# Patient Record
Sex: Male | Born: 1935
Health system: Southern US, Community
[De-identification: ages and names within clinical notes are randomized; demographics above are authoritative.]

## PROBLEM LIST (undated history)

## (undated) DIAGNOSIS — K859 Acute pancreatitis without necrosis or infection, unspecified: Secondary | ICD-10-CM

## (undated) DIAGNOSIS — N400 Enlarged prostate without lower urinary tract symptoms: Secondary | ICD-10-CM

## (undated) DIAGNOSIS — E878 Other disorders of electrolyte and fluid balance, not elsewhere classified: Secondary | ICD-10-CM

## (undated) DIAGNOSIS — K56609 Unspecified intestinal obstruction, unspecified as to partial versus complete obstruction: Secondary | ICD-10-CM

## (undated) DIAGNOSIS — E7800 Pure hypercholesterolemia, unspecified: Secondary | ICD-10-CM

## (undated) DIAGNOSIS — I10 Essential (primary) hypertension: Secondary | ICD-10-CM

## (undated) DIAGNOSIS — K311 Adult hypertrophic pyloric stenosis: Secondary | ICD-10-CM

---

## 2006-08-01 ENCOUNTER — Other Ambulatory Visit: Payer: Self-pay

## 2006-08-01 ENCOUNTER — Emergency Department: Payer: Self-pay | Admitting: Emergency Medicine

## 2011-01-02 DIAGNOSIS — E785 Hyperlipidemia, unspecified: Secondary | ICD-10-CM | POA: Insufficient documentation

## 2011-01-02 DIAGNOSIS — N400 Enlarged prostate without lower urinary tract symptoms: Secondary | ICD-10-CM | POA: Insufficient documentation

## 2011-01-02 DIAGNOSIS — E669 Obesity, unspecified: Secondary | ICD-10-CM | POA: Insufficient documentation

## 2011-06-06 ENCOUNTER — Inpatient Hospital Stay: Payer: Self-pay | Admitting: Surgery

## 2011-06-06 LAB — CBC
HCT: 50.3 % (ref 40.0–52.0)
MCH: 32.2 pg (ref 26.0–34.0)
MCHC: 33.4 g/dL (ref 32.0–36.0)
MCV: 96 fL (ref 80–100)
Platelet: 196 10*3/uL (ref 150–440)
RDW: 13 % (ref 11.5–14.5)
WBC: 14.6 10*3/uL — ABNORMAL HIGH (ref 3.8–10.6)

## 2011-06-06 LAB — COMPREHENSIVE METABOLIC PANEL
Alkaline Phosphatase: 76 U/L (ref 50–136)
Bilirubin,Total: 0.7 mg/dL (ref 0.2–1.0)
Calcium, Total: 8.9 mg/dL (ref 8.5–10.1)
Creatinine: 1.31 mg/dL — ABNORMAL HIGH (ref 0.60–1.30)
EGFR (Non-African Amer.): 57 — ABNORMAL LOW
Glucose: 180 mg/dL — ABNORMAL HIGH (ref 65–99)
Potassium: 4 mmol/L (ref 3.5–5.1)
SGPT (ALT): 23 U/L
Sodium: 140 mmol/L (ref 136–145)

## 2011-06-06 LAB — LIPASE, BLOOD: Lipase: 161 U/L (ref 73–393)

## 2011-06-07 LAB — URINALYSIS, COMPLETE
Bilirubin,UR: NEGATIVE
Glucose,UR: 150 mg/dL (ref 0–75)
Leukocyte Esterase: NEGATIVE
Protein: 30
RBC,UR: 11 /HPF (ref 0–5)
Squamous Epithelial: <1
WBC UR: 2 /HPF (ref 0–5)

## 2011-06-07 LAB — CBC WITH DIFFERENTIAL/PLATELET
Basophil #: 0 10*3/uL (ref 0.0–0.1)
Eosinophil #: 0 10*3/uL (ref 0.0–0.7)
Eosinophil %: 0.3 %
HCT: 47 % (ref 40.0–52.0)
HGB: 15.6 g/dL (ref 13.0–18.0)
Lymphocyte #: 1.7 10*3/uL (ref 1.0–3.6)
MCH: 32 pg (ref 26.0–34.0)
MCV: 97 fL (ref 80–100)
Monocyte #: 1 10*3/uL — ABNORMAL HIGH (ref 0.0–0.7)
Neutrophil #: 9.6 10*3/uL — ABNORMAL HIGH (ref 1.4–6.5)
Platelet: 191 10*3/uL (ref 150–440)
RDW: 13 % (ref 11.5–14.5)
WBC: 12.4 10*3/uL — ABNORMAL HIGH (ref 3.8–10.6)

## 2011-06-07 LAB — BASIC METABOLIC PANEL
BUN: 21 mg/dL — ABNORMAL HIGH (ref 7–18)
Creatinine: 1.25 mg/dL (ref 0.60–1.30)
EGFR (African American): >60
EGFR (Non-African Amer.): >60

## 2011-06-07 LAB — CLOSTRIDIUM DIFFICILE BY PCR

## 2011-06-08 LAB — CBC WITH DIFFERENTIAL/PLATELET
Basophil #: 0 10*3/uL (ref 0.0–0.1)
Basophil %: 0.3 %
Eosinophil #: 0.5 10*3/uL (ref 0.0–0.7)
Eosinophil %: 5.4 %
HCT: 40.9 % (ref 40.0–52.0)
HGB: 13.7 g/dL (ref 13.0–18.0)
Lymphocyte #: 2 10*3/uL (ref 1.0–3.6)
MCV: 96 fL (ref 80–100)
Monocyte #: 0.7 10*3/uL (ref 0.0–0.7)
Monocyte %: 8 %
Neutrophil #: 5.8 10*3/uL (ref 1.4–6.5)
RBC: 4.26 10*6/uL — ABNORMAL LOW (ref 4.40–5.90)
WBC: 9 10*3/uL (ref 3.8–10.6)

## 2011-06-08 LAB — BASIC METABOLIC PANEL
Anion Gap: 8 (ref 7–16)
BUN: 18 mg/dL (ref 7–18)
Calcium, Total: 7.7 mg/dL — ABNORMAL LOW (ref 8.5–10.1)
Chloride: 105 mmol/L (ref 98–107)
Co2: 31 mmol/L (ref 21–32)
Creatinine: 1.14 mg/dL (ref 0.60–1.30)
EGFR (Non-African Amer.): >60
Glucose: 110 mg/dL — ABNORMAL HIGH (ref 65–99)
Osmolality: 289 (ref 275–301)
Potassium: 3.8 mmol/L (ref 3.5–5.1)
Sodium: 144 mmol/L (ref 136–145)

## 2011-08-26 DIAGNOSIS — N400 Enlarged prostate without lower urinary tract symptoms: Secondary | ICD-10-CM | POA: Diagnosis not present

## 2011-08-26 DIAGNOSIS — I10 Essential (primary) hypertension: Secondary | ICD-10-CM | POA: Diagnosis not present

## 2012-01-18 DIAGNOSIS — Z23 Encounter for immunization: Secondary | ICD-10-CM | POA: Diagnosis not present

## 2013-07-07 DIAGNOSIS — I10 Essential (primary) hypertension: Secondary | ICD-10-CM | POA: Diagnosis not present

## 2013-07-07 DIAGNOSIS — E669 Obesity, unspecified: Secondary | ICD-10-CM | POA: Diagnosis not present

## 2013-07-07 DIAGNOSIS — E785 Hyperlipidemia, unspecified: Secondary | ICD-10-CM | POA: Diagnosis not present

## 2013-07-07 DIAGNOSIS — R972 Elevated prostate specific antigen [PSA]: Secondary | ICD-10-CM | POA: Diagnosis not present

## 2013-07-07 DIAGNOSIS — J309 Allergic rhinitis, unspecified: Secondary | ICD-10-CM | POA: Diagnosis not present

## 2013-07-10 DIAGNOSIS — K5900 Constipation, unspecified: Secondary | ICD-10-CM | POA: Diagnosis not present

## 2013-07-18 ENCOUNTER — Ambulatory Visit: Payer: Self-pay | Admitting: Emergency Medicine

## 2013-07-18 DIAGNOSIS — R141 Gas pain: Secondary | ICD-10-CM | POA: Diagnosis not present

## 2013-07-18 DIAGNOSIS — R1013 Epigastric pain: Secondary | ICD-10-CM | POA: Diagnosis not present

## 2013-07-18 DIAGNOSIS — I10 Essential (primary) hypertension: Secondary | ICD-10-CM | POA: Diagnosis not present

## 2013-07-18 DIAGNOSIS — R142 Eructation: Secondary | ICD-10-CM | POA: Diagnosis not present

## 2013-07-18 DIAGNOSIS — Z79899 Other long term (current) drug therapy: Secondary | ICD-10-CM | POA: Diagnosis not present

## 2013-07-18 DIAGNOSIS — E785 Hyperlipidemia, unspecified: Secondary | ICD-10-CM | POA: Diagnosis not present

## 2013-07-18 DIAGNOSIS — E669 Obesity, unspecified: Secondary | ICD-10-CM | POA: Diagnosis not present

## 2013-07-18 DIAGNOSIS — R109 Unspecified abdominal pain: Secondary | ICD-10-CM | POA: Diagnosis not present

## 2013-07-18 DIAGNOSIS — Z7982 Long term (current) use of aspirin: Secondary | ICD-10-CM | POA: Diagnosis not present

## 2013-07-18 LAB — URINALYSIS, COMPLETE
BACTERIA: NEGATIVE
Blood: NEGATIVE
Glucose,UR: NEGATIVE mg/dL (ref 0–75)
KETONE: NEGATIVE
Leukocyte Esterase: NEGATIVE
Nitrite: NEGATIVE
PH: 6 (ref 4.5–8.0)
Specific Gravity: >=1.03 (ref 1.003–1.030)
Squamous Epithelial: NONE SEEN

## 2013-07-18 LAB — CBC WITH DIFFERENTIAL/PLATELET
BASOS PCT: 0.7 %
Basophil #: 0.1 10*3/uL (ref 0.0–0.1)
EOS ABS: 0.6 10*3/uL (ref 0.0–0.7)
Eosinophil %: 5.4 %
HCT: 45.9 % (ref 40.0–52.0)
HGB: 15.6 g/dL (ref 13.0–18.0)
LYMPHS ABS: 2.8 10*3/uL (ref 1.0–3.6)
LYMPHS PCT: 27.1 %
MCH: 32 pg (ref 26.0–34.0)
MCHC: 33.9 g/dL (ref 32.0–36.0)
MCV: 94 fL (ref 80–100)
MONO ABS: 0.9 x10 3/mm (ref 0.2–1.0)
Monocyte %: 8.3 %
NEUTROS ABS: 6.1 10*3/uL (ref 1.4–6.5)
NEUTROS PCT: 58.5 %
Platelet: 199 10*3/uL (ref 150–440)
RBC: 4.86 10*6/uL (ref 4.40–5.90)
RDW: 14.1 % (ref 11.5–14.5)
WBC: 10.5 10*3/uL (ref 3.8–10.6)

## 2013-07-18 LAB — COMPREHENSIVE METABOLIC PANEL
ANION GAP: 6 — AB (ref 7–16)
Albumin: 3.9 g/dL (ref 3.4–5.0)
Alkaline Phosphatase: 96 U/L
BUN: 23 mg/dL — AB (ref 7–18)
Bilirubin,Total: 0.4 mg/dL (ref 0.2–1.0)
CREATININE: 1.47 mg/dL — AB (ref 0.60–1.30)
Calcium, Total: 9.5 mg/dL (ref 8.5–10.1)
Chloride: 101 mmol/L (ref 98–107)
Co2: 31 mmol/L (ref 21–32)
EGFR (African American): 53 — ABNORMAL LOW
EGFR (Non-African Amer.): 46 — ABNORMAL LOW
Glucose: 121 mg/dL — ABNORMAL HIGH (ref 65–99)
Osmolality: 281 (ref 275–301)
POTASSIUM: 4 mmol/L (ref 3.5–5.1)
SGOT(AST): 13 U/L — ABNORMAL LOW (ref 15–37)
SGPT (ALT): 21 U/L (ref 12–78)
Sodium: 138 mmol/L (ref 136–145)
Total Protein: 7.9 g/dL (ref 6.4–8.2)

## 2013-07-19 LAB — URINE CULTURE

## 2014-05-18 DIAGNOSIS — K219 Gastro-esophageal reflux disease without esophagitis: Secondary | ICD-10-CM | POA: Diagnosis not present

## 2014-05-18 DIAGNOSIS — Z1389 Encounter for screening for other disorder: Secondary | ICD-10-CM | POA: Diagnosis not present

## 2014-05-18 DIAGNOSIS — E782 Mixed hyperlipidemia: Secondary | ICD-10-CM | POA: Diagnosis not present

## 2014-05-18 DIAGNOSIS — Z1211 Encounter for screening for malignant neoplasm of colon: Secondary | ICD-10-CM | POA: Diagnosis not present

## 2014-05-18 DIAGNOSIS — Z9181 History of falling: Secondary | ICD-10-CM | POA: Diagnosis not present

## 2014-05-18 DIAGNOSIS — I10 Essential (primary) hypertension: Secondary | ICD-10-CM | POA: Diagnosis not present

## 2014-05-21 DIAGNOSIS — I10 Essential (primary) hypertension: Secondary | ICD-10-CM | POA: Diagnosis not present

## 2014-05-21 DIAGNOSIS — E785 Hyperlipidemia, unspecified: Secondary | ICD-10-CM | POA: Diagnosis not present

## 2014-08-12 NOTE — H&P (Signed)
Subjective/Chief Complaint vomiting    History of Present Illness 24 hrs n/v preceeded by 28 diarrheal stools. no f/c. no prior episode no melena no hematochezia    Past History hypercholesterolemia, bph, pancreatitis no prev surgeries   Past Med/Surgical Hx:  Pancreatitis:   ALLERGIES:  No Known Allergies:   Family and Social History:   Family History Non-Contributory    Social History negative tobacco, negative ETOH, retired Geophysicist/field seismologist of [employer]   Review of Systems:   Fever/Chills No    Cough No    Abdominal Pain No    Diarrhea Yes    Constipation No    Nausea/Vomiting Yes    SOB/DOE No    Chest Pain No    Dysuria No    Tolerating Diet No  Nauseated  Vomiting   Physical Exam:   GEN NAD    HEENT pink conjunctivae    NECK supple    RESP normal resp effort  clear BS    CARD regular rate    ABD denies tenderness  positive hernia  soft  distended  small UH reducible    SKIN normal to palpation    PSYCH alert, A+O to time, place, person, good insight   Routine Chem:  16-Feb-13 17:34    Glucose, Serum 180   BUN 22   Creatinine (comp) 1.31   Sodium, Serum 140   Potassium, Serum 4.0   Chloride, Serum 103   CO2, Serum 23   Calcium (Total), Serum 8.9  Hepatic:  16-Feb-13 17:34    Bilirubin, Total 0.7   Alkaline Phosphatase 76   SGPT (ALT) 23   SGOT (AST) 24   Total Protein, Serum 8.6   Albumin, Serum 4.3  Routine Chem:  16-Feb-13 17:34    Osmolality (calc) 287   eGFR (African American) >60   eGFR (Non-African American) 57   Anion Gap 14  Routine Hem:  16-Feb-13 17:34    WBC (CBC) 14.6   RBC (CBC) 5.22   Hemoglobin (CBC) 16.8   Hematocrit (CBC) 50.3   Platelet Count (CBC) 196   MCV 96   MCH 32.2   MCHC 33.4   RDW 13.0  Routine Chem:  16-Feb-13 17:34    Lipase 161   Radiology Results: CT:    16-Feb-13 22:20, CT Abdomen and Pelvis With Contrast   CT Abdomen and Pelvis With Contrast   REASON FOR EXAM:    (1) ABD  PAIN DISTENTION; (2) REPEATED EPIISODES OF   N/V/D  COMMENTS:       PROCEDURE: CT  - CT ABDOMEN / PELVIS  W  - Jun 06 2011 10:20PM     RESULT: CT of the abdomen and pelvis with 100 mL of Isovue-300 iodinated   intravenous contrast demonstrates a large amount of fluid in the stomach   with a large amount of gas in the stomach as well. Distended loops of   small bowel are present. The colon is decompressed. Scattered colonic   diverticulosis is seen in the descending colon and sigmoid colon. Fat   filled inguinal hernias are present bilaterally. There is no ascites,   pneumatosis or free air. The terminal ileum is nondistended. A definite   transition point is not identified. There does appear to be a slight   changein caliber of the small bowel in the left pelvis just to the left     of midline. The third portion of the duodenum does not appear to cross  midline suggesting a degree of small bowel malrotation. The liver,   spleen, pancreas, aorta, kidneys and adrenal glands appear to be within   normal limits. There is no hydronephrosis or adenopathy. The urinary   bladder is unremarkable.    IMPRESSION:   1. Findings suggestive of partial small bowel obstruction or evolving   complete small bowel obstruction with distended loops of mid to proximal   small bowel in significant gastric distention with relative decompression   of the colon and distal small bowel. A well-defined transition is not   identified. There may be a mild malrotation variant present. Minimal   right lung base atelectasis is present.        Verified By: Sundra Aland, M.D., MD     Assessment/Admission Diagnosis CT rev'd suggests sbo but pt's hx suggests gastroenteritis dehydrated will admit, hydrate reexamine   Electronic Signatures: Florene Glen (MD)  (Signed 16-Feb-13 23:29)  Authored: CHIEF COMPLAINT and HISTORY, PAST MEDICAL/SURGIAL HISTORY, ALLERGIES, FAMILY AND SOCIAL HISTORY, REVIEW OF  SYSTEMS, PHYSICAL EXAM, LABS, Radiology, ASSESSMENT AND PLAN   Last Updated: 16-Feb-13 23:29 by Florene Glen (MD)

## 2014-08-12 NOTE — H&P (Signed)
PATIENT NAME:  [name], MOURE MR#:  [id number] DATE OF BIRTH:  1934/05/02  DATE OF ADMISSION:  06/06/2011  CHIEF COMPLAINT: Vomiting.   HISTORY OF PRESENT ILLNESS: This is a 80 year old Caucasian male patient. He states that yesterday he started having nausea, vomiting, and severe diarrhea. In fact, he states he had at least 25 to 28 stools, ultimately having pure water. He denies melena or hematochezia and has never had an episode like this before. Early this morning the diarrhea abated and switched to vomiting. He has had severe nausea but unable to keep anything down, and has vomited at least 4 times while in the ER. He has not had any hematemesis. He denies fevers or chills. He has never had an episode like this before.   PAST MEDICAL HISTORY:  1. Hypercholesterolemia.  2. Benign prostatic hypertrophy. 3. Pancreatitis of unclear etiology.   PAST SURGICAL HISTORY: None.   ALLERGIES: None.   MEDICATIONS: Simvastatin.   FAMILY HISTORY: Noncontributory.   SOCIAL HISTORY: The patient is retired. He worked in Theatre manager at [employer]. He does not smoke or drink alcohol.   REVIEW OF SYSTEMS: 10-system review has been performed and negative with the exception of that mentioned in the history of present illness.   PHYSICAL EXAMINATION:  GENERAL: Comfortable-appearing Caucasian male patient.  His abdomen appears distended.   VITAL SIGNS: Temperature 94.3, has not been rechecked, pulse 86, respirations 18, blood pressure 174/108, pulse oximetry 92.   HEENT: No scleral icterus.   NECK: No palpable neck nodes.   CHEST: Clear to auscultation.   CARDIAC: Regular rate and rhythm.   ABDOMEN: Distended, slightly tympanitic, nontender. There is a small umbilical hernia which is reducible and nontender. No peritoneal signs.   EXTREMITIES: Without edema. Calves are nontender.   NEUROLOGIC: Grossly intact.   INTEGUMENT: No jaundice.   LABORATORY, DIAGNOSTIC, AND RADIOLOGICAL DATA:  Laboratory values demonstrate a white blood cell count of 14.6, hemoglobin and hematocrit 16.8 and 50.3 and a platelet count 196. Lipase 161. Electrolytes demonstrate an elevated creatinine of 1.31, otherwise normal electrolytes with potassium 4.   CT scan is personally reviewed. It is read as being a small bowel obstruction but there is gas and there is diverticular disease without surrounding diverticulitis.   ASSESSMENT AND PLAN: This is a patient who may have a partial small bowel obstruction but I believe he is more likely to have a gastroenteritis, possibly viral or bacterial, in that he had 25 to 28 stools yesterday and that his diarrhea has abated. He has had nausea and vomiting and has a greatly distended stomach and small bowel. With that in mind, I have recommended admission to the hospital, hydration, nasogastric tube, and re-examination. This was all discussed with him and with Dr. Cinda Quest in the ED. He understood and agreed to proceed.    ____________________________ Jerrol Banana Burt Knack, MD rec:bjt D:  06/06/2011 23:34:53 ET         T: 06/07/2011 08:09:14 ET        JOB#: [id number] Florene Glen MD ELECTRONICALLY SIGNED 06/08/2011 6:51

## 2014-08-12 NOTE — Discharge Summary (Signed)
PATIENT NAME:  [name], [name] MR#:  [id number] DATE OF BIRTH:  07-27-1934  DATE OF ADMISSION:  06/06/2011 DATE OF DISCHARGE:  06/09/2011  DISCHARGE DIAGNOSES:  1. Hypertension. 2. Hypercholesterolemia. 3. Benign prostatic hypertrophy.  4. History of pancreatitis. 5. Gastroenteritis. 6. Ileus.   PROCEDURES: None.   HISTORY OF PRESENT ILLNESS/HOSPITAL COURSE: The patient is a 80 year old male who presented with one day of severe nausea, vomiting and diarrhea. He had had 25 to 28 stools on the day prior to admission and then had multiple emeses. No one else in the family was ill; however, the patient presented and was fairly dehydrated. He had a history of hypertension but was not being treated for it, and on admission his blood pressure was elevated.   He was started on metoprolol IV. A nasogastric tube was placed to control his nausea and vomiting. His distention promptly resolved after placing over 2 liters out of his nasogastric tube. His diarrhea stopped. He started passing gas. His x-rays looked much like an ileus in that he had gas in his transverse colon. A CT scan done on the day of admission was suggestive of a bowel obstruction, but his history was more consistent with a gastroenteritis and ileus. The nasogastric tube was ultimately removed when his NG output decreased considerably and he was passing gas. He is currently tolerating a regular diet, and he is discharged in stable condition with instructions to call his primary care physician for an appointment in two weeks. He now tells me that he has a physician's appointment later this week.  I will start him on p.o. metoprolol 25 mg daily and ask him to notify his physician of his new medication for his untreated hypertension. His blood pressure after being treated with IV metoprolol is much improved and controlled in the normal range. This was all reviewed with him.     The recommendation to follow up with [place] if he has any further  problems was discussed, but at this point he needs no further followup with the Surgical Service.  ____________________________ Jerrol Banana Burt Knack, MD rec:cbb D: 06/09/2011 06:21:11 ET T: 06/09/2011 10:55:23 ET JOB#: [id number]  cc: Jerrol Banana. Burt Knack, MD, <Dictator> Florene Glen MD ELECTRONICALLY SIGNED 06/10/2011 20:53

## 2014-08-29 DIAGNOSIS — Z008 Encounter for other general examination: Secondary | ICD-10-CM | POA: Diagnosis not present

## 2014-11-21 ENCOUNTER — Emergency Department: Payer: Medicare Other

## 2014-11-21 ENCOUNTER — Ambulatory Visit (INDEPENDENT_AMBULATORY_CARE_PROVIDER_SITE_OTHER)
Admission: EM | Admit: 2014-11-21 | Discharge: 2014-11-21 | Disposition: A | Discharge status: Home / Self Care | Payer: Medicare Other | Source: Home / Self Care | Attending: Family Medicine | Admitting: Family Medicine

## 2014-11-21 ENCOUNTER — Inpatient Hospital Stay
Admission: EM | Admit: 2014-11-21 | Discharge: 2014-11-24 | DRG: 390 | Disposition: A | Discharge status: Home / Self Care | Payer: Medicare Other | Attending: Surgery | Admitting: Surgery

## 2014-11-21 DIAGNOSIS — R140 Abdominal distension (gaseous): Secondary | ICD-10-CM | POA: Diagnosis not present

## 2014-11-21 DIAGNOSIS — E878 Other disorders of electrolyte and fluid balance, not elsewhere classified: Secondary | ICD-10-CM | POA: Insufficient documentation

## 2014-11-21 DIAGNOSIS — K5669 Other intestinal obstruction: Secondary | ICD-10-CM | POA: Diagnosis not present

## 2014-11-21 DIAGNOSIS — K5660 Unspecified intestinal obstruction: Principal | ICD-10-CM | POA: Diagnosis present

## 2014-11-21 DIAGNOSIS — K567 Ileus, unspecified: Secondary | ICD-10-CM

## 2014-11-21 DIAGNOSIS — K5730 Diverticulosis of large intestine without perforation or abscess without bleeding: Secondary | ICD-10-CM | POA: Diagnosis not present

## 2014-11-21 DIAGNOSIS — Z4682 Encounter for fitting and adjustment of non-vascular catheter: Secondary | ICD-10-CM | POA: Diagnosis not present

## 2014-11-21 DIAGNOSIS — K56609 Unspecified intestinal obstruction, unspecified as to partial versus complete obstruction: Secondary | ICD-10-CM | POA: Diagnosis present

## 2014-11-21 DIAGNOSIS — E780 Pure hypercholesterolemia: Secondary | ICD-10-CM | POA: Diagnosis present

## 2014-11-21 DIAGNOSIS — R1084 Generalized abdominal pain: Secondary | ICD-10-CM | POA: Diagnosis not present

## 2014-11-21 DIAGNOSIS — K6389 Other specified diseases of intestine: Secondary | ICD-10-CM | POA: Diagnosis not present

## 2014-11-21 DIAGNOSIS — R109 Unspecified abdominal pain: Secondary | ICD-10-CM | POA: Diagnosis not present

## 2014-11-21 HISTORY — DX: Other disorders of electrolyte and fluid balance, not elsewhere classified: E87.8

## 2014-11-21 HISTORY — DX: Unspecified intestinal obstruction, unspecified as to partial versus complete obstruction: K56.609

## 2014-11-21 HISTORY — DX: Pure hypercholesterolemia, unspecified: E78.00

## 2014-11-21 LAB — CBC
HEMATOCRIT: 45.8 % (ref 40.0–52.0)
HEMOGLOBIN: 15.6 g/dL (ref 13.0–18.0)
MCH: 32.3 pg (ref 26.0–34.0)
MCHC: 33.9 g/dL (ref 32.0–36.0)
MCV: 95.2 fL (ref 80.0–100.0)
Platelets: 190 10*3/uL (ref 150–440)
RBC: 4.81 MIL/uL (ref 4.40–5.90)
RDW: 13.2 % (ref 11.5–14.5)
WBC: 11.1 10*3/uL — ABNORMAL HIGH (ref 3.8–10.6)

## 2014-11-21 LAB — COMPREHENSIVE METABOLIC PANEL
ALT: 16 U/L — AB (ref 17–63)
AST: 19 U/L (ref 15–41)
Albumin: 4.3 g/dL (ref 3.5–5.0)
Alkaline Phosphatase: 75 U/L (ref 38–126)
Anion gap: 8 (ref 5–15)
BILIRUBIN TOTAL: 0.8 mg/dL (ref 0.3–1.2)
BUN: 17 mg/dL (ref 6–20)
CHLORIDE: 101 mmol/L (ref 101–111)
CO2: 29 mmol/L (ref 22–32)
Calcium: 9.2 mg/dL (ref 8.9–10.3)
Creatinine, Ser: 1.17 mg/dL (ref 0.61–1.24)
GFR, EST NON AFRICAN AMERICAN: 58 mL/min — AB (ref >60)
GLUCOSE: 121 mg/dL — AB (ref 65–99)
Potassium: 3.9 mmol/L (ref 3.5–5.1)
Sodium: 138 mmol/L (ref 135–145)
Total Protein: 8 g/dL (ref 6.5–8.1)

## 2014-11-21 LAB — URINALYSIS COMPLETE WITH MICROSCOPIC (ARMC ONLY)
Bacteria, UA: NONE SEEN
Bilirubin Urine: NEGATIVE
GLUCOSE, UA: 50 mg/dL — AB
Hgb urine dipstick: NEGATIVE
KETONES UR: NEGATIVE mg/dL
LEUKOCYTES UA: NEGATIVE
NITRITE: NEGATIVE
PH: 6 (ref 5.0–8.0)
PROTEIN: 30 mg/dL — AB
RBC / HPF: NONE SEEN RBC/hpf (ref 0–5)
SPECIFIC GRAVITY, URINE: 1.024 (ref 1.005–1.030)
Squamous Epithelial / LPF: NONE SEEN

## 2014-11-21 LAB — LIPASE, BLOOD: Lipase: 32 U/L (ref 22–51)

## 2014-11-21 LAB — OCCULT BLOOD X 1 CARD TO LAB, STOOL: FECAL OCCULT BLD: NEGATIVE

## 2014-11-21 MED ORDER — MORPHINE SULFATE 2 MG/ML IJ SOLN: 2.0000 mg | INTRAMUSCULAR | Status: DC | PRN

## 2014-11-21 MED ORDER — IOHEXOL 240 MG/ML SOLN
25.0000 mL | Freq: Once | INTRAMUSCULAR | Status: AC | PRN
  Administered 2014-11-21: 25 mL via ORAL

## 2014-11-21 MED ORDER — ACETAMINOPHEN 650 MG RE SUPP: 650.0000 mg | Freq: Four times a day (QID) | RECTAL | Status: DC | PRN

## 2014-11-21 MED ORDER — LACTATED RINGERS IV SOLN
INTRAVENOUS | Status: DC
  Administered 2014-11-21 – 2014-11-23 (×6): via INTRAVENOUS

## 2014-11-21 MED ORDER — ENOXAPARIN SODIUM 40 MG/0.4ML ~~LOC~~ SOLN
40.0000 mg | SUBCUTANEOUS | Status: DC
  Filled 2014-11-21 (×2): qty 0.4

## 2014-11-21 MED ORDER — SODIUM CHLORIDE 0.9 % IV SOLN
Freq: Once | INTRAVENOUS | Status: AC
  Administered 2014-11-21: 13:00:00 via INTRAVENOUS

## 2014-11-21 MED ORDER — ACETAMINOPHEN 325 MG PO TABS
650.0000 mg | ORAL_TABLET | Freq: Four times a day (QID) | ORAL | Status: DC | PRN
Start: 2014-11-21 — End: 2014-11-24

## 2014-11-21 MED ORDER — IOHEXOL 300 MG/ML  SOLN
100.0000 mL | Freq: Once | INTRAMUSCULAR | Status: AC | PRN
  Administered 2014-11-21: 100 mL via INTRAVENOUS

## 2014-11-21 NOTE — ED Notes (Signed)
Dr Felton Clinton in with pt now.

## 2014-11-21 NOTE — ED Notes (Signed)
Resumed  Care from shane rn. Pt alert.  Denies abd pain.  Family with pt   Alert.

## 2014-11-21 NOTE — ED Notes (Signed)
Pt states "I was constipated and took a suppository yesterday, I had a bowel movement, and then I vomited 4 times during the night. I have had a bowel obstruction about 3 years ago. I was in the hospital and had my stomach sucked out."

## 2014-11-21 NOTE — H&P (Signed)
[name] is an 80 y.o. male.     Chief Complaint: vomiting. HPI:   This is a 80 year old white male without past surgical history of the abdomen who presents to the hospital in referral from local urgent care with a approximate 2 week history of not feeling well intermittent abdominal pain and mild distention followed by vomiting and profuse vomiting starting Tuesday night after a steak dinner Monday night. Imaging in the CT scanner demonstrates possibility of small bowel obstruction. For this reason surgical services were consulted. Of note the patient was admitted to the hospital with our care in 2013 with an ileus which resolved with nasogastric tube decompression. The patient denies a history of gastric ulcer abdominal trauma or diverticulitis. There is been no sick contacts no fevers last passage of flatus last night.  Past Medical History  Diagnosis Date  . Bowel obstruction   . Hyperchloremia   . High cholesterol     History reviewed. No pertinent past surgical history.  No family history on file.   Social History:  reports that he has never smoked. He does not have any smokeless tobacco history on file. He reports that he does not drink alcohol or use illicit drugs.  Allergies: No Known Allergies  Review of Systems  Constitutional: Negative.  Negative for fever, chills and weight loss.  Gastrointestinal: Positive for heartburn, nausea, vomiting and abdominal pain. Negative for diarrhea, blood in stool and melena.  Genitourinary: Negative.   Musculoskeletal: Negative.   Skin: Negative.   Neurological: Negative.   Psychiatric/Behavioral: Negative.   All other systems reviewed and are negative.   Physical Exam:  Physical Exam  Constitutional: He is oriented to person, place, and time and well-developed, well-nourished, and in no distress. No distress.  HENT:  Head: Normocephalic and atraumatic.  Eyes: Conjunctivae are normal. Pupils are equal, round, and reactive to  light.  Neck: Neck supple. No tracheal deviation present. No thyromegaly present.  Cardiovascular: Normal rate and regular rhythm.   Pulmonary/Chest: Effort normal and breath sounds normal. No respiratory distress. He has no wheezes.  Abdominal: Soft. He exhibits no distension and no mass. There is no tenderness. There is no rebound and no guarding.  Musculoskeletal: He exhibits no edema.  Neurological: He is oriented to person, place, and time.  Skin: Skin is warm and dry. He is not diaphoretic.  Psychiatric: Mood, memory, affect and judgment normal.    Blood pressure 177/99, pulse 69, temperature 97.9 F (36.6 C), temperature source Oral, resp. rate 20, height _0  (1.778 m), weight 205 lb (92.987 kg), SpO2 96 %.  Results for orders placed or performed during the hospital encounter of 11/21/14 (from the past 48 hour(s))  Lipase, blood     Status: None   Collection Time: 11/21/14 10:47 AM  Result Value Ref Range   Lipase 32 22 - 51 U/L  Comprehensive metabolic panel     Status: Abnormal   Collection Time: 11/21/14 10:47 AM  Result Value Ref Range   Sodium 138 135 - 145 mmol/L   Potassium 3.9 3.5 - 5.1 mmol/L   Chloride 101 101 - 111 mmol/L   CO2 29 22 - 32 mmol/L   Glucose, Bld 121 (H) 65 - 99 mg/dL   BUN 17 6 - 20 mg/dL   Creatinine, Ser 1.17 0.61 - 1.24 mg/dL   Calcium 9.2 8.9 - 10.3 mg/dL   Total Protein 8.0 6.5 - 8.1 g/dL   Albumin 4.3 3.5 - 5.0 g/dL  AST 19 15 - 41 U/L   ALT 16 (L) 17 - 63 U/L   Alkaline Phosphatase 75 38 - 126 U/L   Total Bilirubin 0.8 0.3 - 1.2 mg/dL   GFR calc non Af Amer 58 (L) >60 mL/min   GFR calc Af Amer >60 >60 mL/min    Comment: (NOTE) The eGFR has been calculated using the CKD EPI equation. This calculation has not been validated in all clinical situations. eGFR's persistently <60 mL/min signify possible Chronic Kidney Disease.    Anion gap 8 5 - 15  CBC     Status: Abnormal   Collection Time: 11/21/14 10:47 AM  Result Value Ref Range    WBC 11.1 (H) 3.8 - 10.6 K/uL   RBC 4.81 4.40 - 5.90 MIL/uL   Hemoglobin 15.6 13.0 - 18.0 g/dL   HCT 45.8 40.0 - 52.0 %   MCV 95.2 80.0 - 100.0 fL   MCH 32.3 26.0 - 34.0 pg   MCHC 33.9 32.0 - 36.0 g/dL   RDW 13.2 11.5 - 14.5 %   Platelets 190 150 - 440 K/uL  Urinalysis complete, with microscopic ([facility] only)     Status: Abnormal   Collection Time: 11/21/14  2:15 PM  Result Value Ref Range   Color, Urine YELLOW (A) YELLOW   APPearance CLEAR (A) CLEAR   Glucose, UA 50 (A) NEGATIVE mg/dL   Bilirubin Urine NEGATIVE NEGATIVE   Ketones, ur NEGATIVE NEGATIVE mg/dL   Specific Gravity, Urine 1.024 1.005 - 1.030   Hgb urine dipstick NEGATIVE NEGATIVE   pH 6.0 5.0 - 8.0   Protein, ur 30 (A) NEGATIVE mg/dL   Nitrite NEGATIVE NEGATIVE   Leukocytes, UA NEGATIVE NEGATIVE   RBC / HPF NONE SEEN 0 - 5 RBC/hpf   WBC, UA 0-5 0 - 5 WBC/hpf   Bacteria, UA NONE SEEN NONE SEEN   Squamous Epithelial / LPF NONE SEEN NONE SEEN   Mucous PRESENT    Hyaline Casts, UA PRESENT    Dg Abd 1 View  11/21/2014   CLINICAL DATA:  Abdominal pain  EXAM: ABDOMEN - 1 VIEW  COMPARISON:  07/18/2013  FINDINGS: The bowel gas pattern is normal. No radio-opaque calculi or other significant radiographic abnormality are seen.  IMPRESSION: Negative.   Electronically Signed   By: Franchot Gallo M.D.   On: 11/21/2014 13:10   Ct Abdomen Pelvis W Contrast  11/21/2014   CLINICAL DATA:  Increasing mid abdominal pain over 2 weeks worse overnight associated with multiple episodes of vomiting, has had constipation which improved following suppository yesterday, black stool, takes aspirin and iron supplements, history of ileus, bowel obstruction normal appendix.  Descending and sigmoid colonic diverticulosis without evidence of diverticulitis.  Distended stomach containing fluid and food debris.  EXAM: CT ABDOMEN AND PELVIS WITH CONTRAST  TECHNIQUE: Multidetector CT imaging of the abdomen and pelvis was performed using the standard protocol  following bolus administration of intravenous contrast. Sagittal and coronal MPR images reconstructed from axial data set.  CONTRAST:  168m OMNIPAQUE IOHEXOL 300 MG/ML SOLN, 246mOMNIPAQUE IOHEXOL 240 MG/ML SOLN  COMPARISON:  06/06/2011  FINDINGS: Lung bases clear.  6 mm LEFT lobe liver cyst image 13.  Liver, gallbladder, spleen, pancreas, kidneys, and adrenal glands normal appearance.  Normal appendix.  Distended stomach containing fluid, contrast and food debris with decompressed duodenum.  Diverticulosis of descending and sigmoid colon without evidence of diverticulitis.  Partial small bowel malrotation, proximal jejunum located in RIGHT upper quadrant.  Remaining bowel loops  unremarkable.  Large BILATERAL inguinal hernias containing fat.  No mass, adenopathy, free air, free fluid or inflammatory process.  Small umbilical hernia containing fat.  Unremarkable bladder and ureters with minimal prostatic enlargement.  Few scattered atherosclerotic calcifications.  IMPRESSION: Distal colonic diverticulosis without evidence of diverticulitis.  Partial small bowel malrotation.  Distended stomach, also noted on the prior exam of 06/06/2011 ; this could reflect a component of chronic gastric outlet obstruction.  Large BILATERAL inguinal and small umbilical hernias containing fat.   Electronically Signed   By: Lavonia Dana M.D.   On: 11/21/2014 14:51     Assessment/Plan 81 year old white male with vomiting and abdominal pain and mild distention.   Imaging is concerning for a proximal small bowel obstruction. He has a history of ileus. In comparing these 2 CT scans he looked very different.   At present I do not see an acute indication for surgical intervention but rather nasogastric tube decompression with serial abdominal examinations. I do have a very strong suspicion however that he will require operative intervention within the next 24 hours.   Both him and his wife in agreement with admission to the  hospital and all other questions were answered.   Hortencia Conradi, MD, FACS

## 2014-11-21 NOTE — Progress Notes (Signed)
Received patient from the ER and patient is alert and oriented. NGT in placed and draining light brown secretions. Patient denies pain at [facility] time and no complaints of nausea. IV infusing without S/S of infiltration or infection.

## 2014-11-21 NOTE — ED Provider Notes (Signed)
[facility] Emergency Department Provider Note  ____________________________________________  Time seen: Approximately1245 PM  I have reviewed the triage vital signs and the nursing notes.   HISTORY  Chief Complaint Abdominal Pain   HPI [name] is a 80 y.o. male presents to the ER for complaints of abdominal pain. Patient reports that he has had increasing abdominal pain over the last 2 weeks which acutely increased again last night. Patient reports that last night increase of abdominal pain was a mid pain with vomiting 4-5 times. Patient Route reports most recent vomiting at 4 AM this morning. Patient reports history of chronic mid abdominal pain. Patient reports that he does have intermittent constipation and reports that yesterday afternoon he used a suppository with large bowel movement post. States no bowel movement or passing of gas since. Patient reports that he has had mid abdominal pain at 5-6 out of 10 since last night but currently reports the pain is 2 out of 10. Patient and wife reports that abdomen is larger and more distended than his normal. Denies  diarrhea or fever. Denies current nausea.  Patient reports history of ileus 2-3 years ago which required NG tube and hospitalization. Denies abdominal surgery. Patient reports that he was seen at medical in urgent care prior to arrival in ER. Patient reports that he was referred to the ER for further evaluation of abdominal pain.    Past Medical History  Diagnosis Date  . Bowel obstruction   . Hyperchloremia   . High cholesterol     Patient Active Problem List   Diagnosis Date Noted  . Small bowel obstruction 11/21/2014  . Generalized abdominal pain     History reviewed. No pertinent past surgical history.  Current Outpatient Rx  Name  Route  Sig  Dispense  Refill  . simvastatin (ZOCOR) 20 MG tablet   Oral   Take 20 mg by mouth daily.         Marland Kitchen terazosin (HYTRIN) 10 MG capsule  Oral   Take 10 mg by mouth at bedtime.           Allergies Review of patient's allergies indicates no known allergies.  No family history on file.  Social History History  Substance Use Topics  . Smoking status: Never Smoker   . Smokeless tobacco: Not on file  . Alcohol Use: No    Review of Systems Constitutional: No fever/chills Eyes: No visual changes. ENT: No sore throat. Cardiovascular: Denies chest pain. Respiratory: Denies shortness of breath. Gastrointestinal: positive for abdominal pain as above.  No nausea, no vomiting.  No diarrhea.  No constipation. Genitourinary: Negative for dysuria. Musculoskeletal: Negative for back pain. Skin: Negative for rash. Neurological: Negative for headaches, focal weakness or numbness.  10-point ROS otherwise negative.  ____________________________________________   PHYSICAL EXAM:  VITAL SIGNS: ED Triage Vitals  Enc Vitals Group     BP 11/21/14 1043 187/104 mmHg     Pulse Rate 11/21/14 1043 71     Resp 11/21/14 1043 14     Temp 11/21/14 1043 97.9 F (36.6 C)     Temp Source 11/21/14 1043 Oral     SpO2 11/21/14 1043 99 %     Weight 11/21/14 1043 205 lb (92.987 kg)     Height 11/21/14 1043 5\' 10"  (1.778 m)     Head Cir --      Peak Flow --      Pain Score 11/21/14 1044 7     Pain Loc --  Pain Edu? --      Excl. in [place]? --     Constitutional: Alert and oriented. Well appearing and in no acute distress. Eyes: Conjunctivae are normal. PERRL. EOMI. Head: Atraumatic.  Nose: No congestion/rhinnorhea.  Mouth/Throat: Mucous membranes are moist.  Oropharynx non-erythematous. Neck: No stridor.  No cervical spine tenderness to palpation. Hematological/Lymphatic/Immunilogical: No cervical lymphadenopathy. Cardiovascular: Normal rate, regular rhythm. Grossly normal heart sounds.  Good peripheral circulation. Respiratory: Normal respiratory effort.  No retractions. Lungs CTAB. Gastrointestinal: Soft. Mild mid abd TTP.  Mild distention. Bowel sounds hypoactive.  No abdominal bruits. No CVA tenderness. Rectal exam: black stool. hemoccult negative; QC verified. No impaction. Musculoskeletal: No lower or upper extremity tenderness nor edema.  No joint effusions. Bilateral pedal pulses equal and easily palpated.  Neurologic:  Normal speech and language. No gross focal neurologic deficits are appreciated. No gait instability. Skin:  Skin is warm, dry and intact. No rash noted. Psychiatric: Mood and affect are normal. Speech and behavior are normal.  ____________________________________________   LABS (all labs ordered are listed, but only abnormal results are displayed)  Labs Reviewed  COMPREHENSIVE METABOLIC PANEL - Abnormal; Notable for the following:    Glucose, Bld 121 (*)    ALT 16 (*)    GFR calc non Af Amer 58 (*)    All other components within normal limits  CBC - Abnormal; Notable for the following:    WBC 11.1 (*)    All other components within normal limits  URINALYSIS COMPLETEWITH MICROSCOPIC ([facility] ONLY) - Abnormal; Notable for the following:    Color, Urine YELLOW (*)    APPearance CLEAR (*)    Glucose, UA 50 (*)    Protein, ur 30 (*)    All other components within normal limits  LIPASE, BLOOD  URINALYSIS COMPLETEWITH MICROSCOPIC ([facility] ONLY)   ____________________________________________ RADIOLOGY ABDOMEN - 1 VIEW  COMPARISON: 07/18/2013  FINDINGS: The bowel gas pattern is normal. No radio-opaque calculi or other significant radiographic abnormality are seen.  IMPRESSION: Negative.   Electronically Signed By: Franchot Gallo M.D. On: 11/21/2014 13:10  _EXAM: CT ABDOMEN AND PELVIS WITH CONTRAST  TECHNIQUE: Multidetector CT imaging of the abdomen and pelvis was performed using the standard protocol following bolus administration of intravenous contrast. Sagittal and coronal MPR images reconstructed from axial data set.  CONTRAST: 11mL OMNIPAQUE IOHEXOL 300 MG/ML  SOLN, 78mL OMNIPAQUE IOHEXOL 240 MG/ML SOLN  COMPARISON: 06/06/2011  FINDINGS: Lung bases clear.  6 mm LEFT lobe liver cyst image 13.  Liver, gallbladder, spleen, pancreas, kidneys, and adrenal glands normal appearance.  Normal appendix.  Distended stomach containing fluid, contrast and food debris with decompressed duodenum.  Diverticulosis of descending and sigmoid colon without evidence of diverticulitis.  Partial small bowel malrotation, proximal jejunum located in RIGHT upper quadrant.  Remaining bowel loops unremarkable.  Large BILATERAL inguinal hernias containing fat.  No mass, adenopathy, free air, free fluid or inflammatory process.  Small umbilical hernia containing fat.  Unremarkable bladder and ureters with minimal prostatic enlargement.  Few scattered atherosclerotic calcifications.  IMPRESSION: Distal colonic diverticulosis without evidence of diverticulitis.  Partial small bowel malrotation.  Distended stomach, also noted on the prior exam of 06/06/2011 ; this could reflect a component of chronic gastric outlet obstruction.  Large BILATERAL inguinal and small umbilical hernias containing fat.   Electronically Signed By: Lavonia Dana M.D. On: 11/21/2014 14:51  I, Marylene Land, personally viewed and evaluated these images as part of my medical decision making.  ___________________________________________    INITIAL  IMPRESSION / ASSESSMENT AND PLAN / ED COURSE  Pertinent labs & imaging results that were available during my care of the patient were reviewed by me and considered in my medical decision making (see chart for details).  Very well-appearing patient. Presents to ER for complaints of 2 weeks of abdominal pain with acute increase in abdominal pain last night with accompanying nausea and vomiting. Patient reports that current abdominal pain has improved. Patient complains of mid abdominal pain. Patient reports history of similar  with ileus. Patient KUB x-ray is negative. Labs reviewed. We'll further evaluate with CT abdomen. Patient and spouse at bedside verbalized understanding and agreed to plan.Denies need for pain medication at this time.   1515: Discussed patient and planning care with Dr. Marina Gravel surgery. Dr. Marina Gravel to review CT scan. Awaiting bird callback.concern for potential small bowel obstruction.   1545: Dr. Marina Gravel at bedside with patient and patient's spouse.  1635: In discussing with Dr.Bird, Dr Marina Gravel plans to admit patient to hospital for monitoring due to concern for potential bowel obstruction. Dr Marina Gravel recommends NG tube be placed at this time in ER. Patient and spouse verbalized understanding and agree to plan.Will admit.   ____________________________________________    FINAL CLINICAL IMPRESSION(S) / ED DIAGNOSES  Final diagnoses:  Generalized abdominal pain       Marylene Land, NP 11/21/14 1706  Joanne Gavel, MD 11/22/14 2197

## 2014-11-21 NOTE — ED Provider Notes (Addendum)
CSN: [id number]     Arrival date & time 11/21/14  0847 History   First MD Initiated Contact with Patient 11/21/14 [phone]     Chief Complaint  Patient presents with  . Emesis   (Consider location/radiation/quality/duration/timing/severity/associated sxs/prior Treatment) HPI C/o increasing abdominal pain over past two weeks, much worse overnight and associated with emesis x 4 most recent at 4 AM. Took a suppository yesterday with good results but did not help abdominal pain. Says stool was black and taking asa 81mg  daily but has also been taking iron supplements. Hx ileus requiring hospitalization and NG three years ago. Takes Zantac daily to "stomach problems" but denies hx GERD or PUD. Denies fever or diarrhea but wife says abdomen significantly more distended than usual.  Past Medical History  Diagnosis Date  . Bowel obstruction   . Hyperchloremia    History reviewed. No pertinent past surgical history. History reviewed. No pertinent family history. History  Substance Use Topics  . Smoking status: Never Smoker   . Smokeless tobacco: Not on file  . Alcohol Use: No    Review of Systems  Allergies  Review of patient's allergies indicates not on file.  Home Medications   Prior to Admission medications   Medication Sig Start Date End Date Taking? Authorizing Provider  simvastatin (ZOCOR) 20 MG tablet Take 20 mg by mouth daily.   Yes Historical Provider, MD  terazosin (HYTRIN) 10 MG capsule Take 10 mg by mouth at bedtime.   Yes Historical Provider, MD   BP 160/84 mmHg  Pulse 78  Temp(Src) 97.5 F (36.4 C) (Tympanic)  Resp 16  Ht 5\' 10"  (1.778 m)  Wt 206 lb (93.441 kg)  BMI 29.56 kg/m2  SpO2 100% Physical Exam  Constitutional: He is oriented to person, place, and time. He appears well-developed and well-nourished. No distress.  Cardiovascular: Normal rate, regular rhythm and normal heart sounds.   Pulmonary/Chest: Effort normal and breath sounds normal.  Abdominal: Soft.   Moderately distended with hypoactive BS, moderate epigastric tenderness, no guarding or rebound Rectal exam shows 2+ prostate and small amount of soft black guiac negative stool.  Musculoskeletal: He exhibits no edema.  Neurological: He is alert and oriented to person, place, and time.  Skin: Skin is warm and dry. He is not diaphoretic.  Psychiatric: He has a normal mood and affect. His behavior is normal.    ED Course  Procedures (including critical care time) Labs Review Labs Reviewed  OCCULT BLOOD X 1 CARD TO LAB, STOOL    Imaging Review No results found.   MDM   1. Ileus of unspecified type    With abdominal pain and distension with hypoactive BS and recurrent emesis as well as hx ileus requiring hospitalization three years ago, pt needs CT abdomen not available today at this facility and further ED evaluation. [facility] ED informed. Wife will transport by private car as condition not emergent.    Adline Potter, MD 11/21/14 1024  Adline Potter, MD 11/21/14 [place], MD 11/21/14 1037

## 2014-11-21 NOTE — ED Notes (Signed)
56fr ngt inserted into  Left nares without difficulty.  approx 800cc returned.  md aware.

## 2014-11-21 NOTE — ED Notes (Signed)
Pt sent from [facility] w/ abd pain for past 2 wks.  Pt is reporting and NVD.

## 2014-11-21 NOTE — ED Notes (Signed)
patient gone for test at this time.

## 2014-11-22 ENCOUNTER — Inpatient Hospital Stay: Payer: Medicare Other

## 2014-11-22 ENCOUNTER — Encounter: Payer: Self-pay | Admitting: Family Medicine

## 2014-11-22 DIAGNOSIS — K56609 Unspecified intestinal obstruction, unspecified as to partial versus complete obstruction: Secondary | ICD-10-CM | POA: Insufficient documentation

## 2014-11-22 LAB — CBC
HEMATOCRIT: 40.6 % (ref 40.0–52.0)
HEMOGLOBIN: 14.1 g/dL (ref 13.0–18.0)
MCH: 32.5 pg (ref 26.0–34.0)
MCHC: 34.6 g/dL (ref 32.0–36.0)
MCV: 93.9 fL (ref 80.0–100.0)
Platelets: 169 10*3/uL (ref 150–440)
RBC: 4.32 MIL/uL — ABNORMAL LOW (ref 4.40–5.90)
RDW: 13 % (ref 11.5–14.5)
WBC: 12.2 10*3/uL — ABNORMAL HIGH (ref 3.8–10.6)

## 2014-11-22 NOTE — Progress Notes (Signed)
Patient ID: [name], male   DOB: 1935/03/27, 80 y.o.   MRN: [id number]  This is a late entry. The patient was seen earlier this a.m.  [facility]   [name]    MR#:  [id number]  DATE OF BIRTH:  17-Sep-1934  SUBJECTIVE:   He has passed some gas. No further abdominal pain is reported. Films demonstrate contrast and air seen throughout the colon. REVIEW OF SYSTEMS:   Review of Systems  Constitutional: Negative for fever, chills and weight loss.  Gastrointestinal: Positive for abdominal pain. Negative for nausea, vomiting, diarrhea, constipation and blood in stool.  Genitourinary: Negative for dysuria and urgency.  All other systems reviewed and are negative.   DRUG ALLERGIES:  No Known Allergies  VITALS:  Blood pressure 161/81, pulse 72, temperature 98.3 F (36.8 C), temperature source Oral, resp. rate 16, height 5\' 10"  (1.778 m), weight 96.571 kg (212 lb 14.4 oz), SpO2 94 %.  PHYSICAL EXAMINATION:  GENERAL:  80 y.o.-year-old patient lying in the bed with no acute distress.  EYES: Pupils equal, round, reactive to light and accommodation. No scleral icterus. Extraocular muscles intact.  HEENT: Head atraumatic, normocephalic. Oropharynx and nasopharynx clear.  NECK:  Supple, no jugular venous distention. No thyroid enlargement, no tenderness.  LUNGS: Normal breath sounds bilaterally, no wheezing, rales,rhonchi or crepitation. No use of accessory muscles of respiration.  CARDIOVASCULAR: S1, S2 normal. No murmurs, rubs, or gallops.  ABDOMEN: Soft, nontender, nondistended. Bowel sounds present. No organomegaly or mass.  EXTREMITIES: No pedal edema, cyanosis, or clubbing.  NEUROLOGIC: Cranial nerves II through XII are intact. Muscle strength 5/5 in all extremities. Sensation intact. Gait not checked.  PSYCHIATRIC: The patient is alert and oriented x 3.  SKIN: No obvious rash, lesion, or ulcer.  NG tube draining lightly bilious material.  I/O last 3  completed shifts: In: 8016 [I.V.:1494; NG/GT:60] Out: 700 [Urine:300; Emesis/NG output:400] Total I/O In: 1192.5 [I.V.:1162.5; NG/GT:30] Out: 2375 [Urine:300; Emesis/NG output:2075]   CBC Latest Ref Rng 11/22/2014 11/21/2014 07/18/2013  WBC 3.8 - 10.6 K/uL 12.2(H) 11.1(H) 10.5  Hemoglobin 13.0 - 18.0 g/dL 14.1 15.6 15.6  Hematocrit 40.0 - 52.0 % 40.6 45.8 45.9  Platelets 150 - 440 K/uL 169 190 199    BMP Latest Ref Rng 11/21/2014 07/18/2013 06/08/2011  Glucose 65 - 99 mg/dL 121(H) 121(H) 110(H)  BUN 6 - 20 mg/dL 17 23(H) 18  Creatinine 0.61 - 1.24 mg/dL 1.17 1.47(H) 1.14  Sodium 135 - 145 mmol/L 138 138 144  Potassium 3.5 - 5.1 mmol/L 3.9 4.0 3.8  Chloride 101 - 111 mmol/L 101 101 105  CO2 22 - 32 mmol/L 29 31 31   Calcium 8.9 - 10.3 mg/dL 9.2 9.5 7.7(L)       ASSESSMENT AND PLAN:   Partial small bowel obstruction unclear etiology. At present I do not see an indication for exploration. NG tube can be advanced 14-15 cm based on the film. We will continue nasogastric tube decompression reassess in the morning.

## 2014-11-22 NOTE — Care Management (Signed)
Patient presents with vomiting, Diagnosed with bowel obstruction.  Patient resting comfortably, with wife at bedside.  Patient states that he obtains his medication from [facility].  Lives at home with wife.  Has no home equipment, no home O2, and has never had home health services.  No CM needs anticipated

## 2014-11-23 ENCOUNTER — Inpatient Hospital Stay: Payer: Medicare Other

## 2014-11-23 NOTE — Progress Notes (Signed)
Patient ID: [name], male   DOB: 1934-05-15, 80 y.o.   MRN: [id number]   [facility]   [name]    MR#:  [id number]  DATE OF BIRTH:  December 14, 1934  SUBJECTIVE:   He is feeling better, no flatus, no nausea NGT hight output despite this.   REVIEW OF SYSTEMS:   Review of Systems  Constitutional: Negative.   HENT: Negative.   Respiratory: Negative for cough.   Gastrointestinal: Negative for nausea, vomiting and abdominal pain.  All other systems reviewed and are negative.   DRUG ALLERGIES:  No Known Allergies  VITALS:  Blood pressure 163/80, pulse 70, temperature 97.9 F (36.6 C), temperature source Oral, resp. rate 18, height 5\' 10"  (1.778 m), weight 96.571 kg (212 lb 14.4 oz), SpO2 92 %.  PHYSICAL EXAMINATION:  GENERAL:  80 y.o.-year-old patient lying in the bed with no acute distress.  EYES: Pupils equal, round, reactive to light and accommodation. No scleral icterus. Extraocular muscles intact.  HEENT: Head atraumatic, normocephalic. Oropharynx and nasopharynx clear.  NECK:  Supple, no jugular venous distention. No thyroid enlargement, no tenderness.  LUNGS: Normal breath sounds bilaterally, no wheezing, rales,rhonchi or crepitation. No use of accessory muscles of respiration.  CARDIOVASCULAR: S1, S2 normal. No murmurs, rubs, or gallops.  ABDOMEN: Soft, nontender, nondistended. Bowel sounds present. No organomegaly or mass.  EXTREMITIES: No pedal edema, cyanosis, or clubbing.  NEUROLOGIC: Cranial nerves II through XII are intact. Muscle strength 5/5 in all extremities. Sensation intact. Gait not checked.  PSYCHIATRIC: The patient is alert and oriented x 3.  SKIN: No obvious rash, lesion, or ulcer.   Review of x-rays from this morning demonstrate no evidence of bowel obstruction with contrast outlining the entire colon. Nasogastric tube is coiled in the stomach.  ASSESSMENT AND PLAN:   Resolved partial small bowel obstruction. Etiology  unclear. Although the NG tube output is high his exam is benign as well as his x-rays are normal. I will discontinue his nasogastric tube and allow full liquids and reevaluate. Recurrent symptoms may require laparoscopy. He is in agreement with this plan

## 2014-11-23 NOTE — Care Management Important Message (Signed)
Important Message  Patient Details  Name: ANANTH FIALLOS MRN: [id number] Date of Birth: Sep 16, 1934   Medicare Important Message Given:  Yes-second notification given    Juliann Pulse A Allmond 11/23/2014, 10:17 AM

## 2014-11-24 NOTE — Progress Notes (Signed)
Patient ID: [name], male   DOB: May 11, 1934, 80 y.o.   MRN: [id number]   [facility]   [name]    MR#:  [id number]  DATE OF BIRTH:  06/21/1934  SUBJECTIVE:   He denies any pain. He tolerated full liquid diet. No nausea no vomiting he's passed gas and had a small bowel movement.  REVIEW OF SYSTEMS:   Review of Systems  Constitutional: Negative for fever and chills.  Respiratory: Negative for cough.   Cardiovascular: Negative for chest pain.  Gastrointestinal: Negative for heartburn, nausea, vomiting and abdominal pain.  Genitourinary: Negative for dysuria.  All other systems reviewed and are negative.   DRUG ALLERGIES:  No Known Allergies  VITALS:  Blood pressure 169/76, pulse 64, temperature 97.5 F (36.4 C), temperature source Oral, resp. rate 17, height 5\' 10"  (1.778 m), weight 94.666 kg (208 lb 11.2 oz), SpO2 92 %.  PHYSICAL EXAMINATION:  GENERAL:  80 y.o.-year-old patient lying in the bed with no acute distress.  EYES: Pupils equal, round, reactive to light and accommodation. No scleral icterus. Extraocular muscles intact.  HEENT: Head atraumatic, normocephalic. Oropharynx and nasopharynx clear.  NECK:  Supple, no jugular venous distention. No thyroid enlargement, no tenderness.  LUNGS: Normal breath sounds bilaterally, no wheezing, rales,rhonchi or crepitation. No use of accessory muscles of respiration.  CARDIOVASCULAR: S1, S2 normal. No murmurs, rubs, or gallops.  ABDOMEN: Soft, nontender, nondistended. Bowel sounds present. No organomegaly or mass.  EXTREMITIES: No pedal edema, cyanosis, or clubbing.  NEUROLOGIC: Cranial nerves II through XII are intact. Muscle strength 5/5 in all extremities. Sensation intact. Gait not checked.  PSYCHIATRIC: The patient is alert and oriented x 3.  SKIN: No obvious rash, lesion, or ulcer.     ASSESSMENT AND PLAN:   It looks like he has a resolved partial small bowel obstruction of unclear  etiology. At present there are no signs to indicate need for laparotomy or laparoscopy. I will advance his diet [place] his IV fluids and discharge him later today with an as needed follow-up in the office. Certainly if he has more symptoms one would warrant laparoscopic evaluation of the small intestine.  Both him and his wife are in agreement with this plan.  All x-rays during his hospitalization again reviewed today.

## 2014-11-24 NOTE — Discharge Summary (Signed)
Physician Discharge Summary  Patient ID: [name] MRN: [id number] DOB/AGE: 1934/10/27 80 y.o.  Admit date: 11/21/2014 Discharge date: 11/24/2014  Admission Diagnoses: Small bowel obstruction unclear etiology.  Discharge Diagnoses:  Active Problems:   Small bowel obstruction   SBO (small bowel obstruction)   Hospital Course:   The patient was admitted with abdominal pain nausea and vomiting. A nasogastric tube was placed and returned a large amount of bilious fluid. The patient really had no abdominal pain during his admission. CT scan imaging did demonstrate what appeared to be small bowel obstruction of the proximal intestine. Of note the patient denies any prior abdominal operations. He did have resolution of whatever process was going on in his abdomen and had passage of flatus and his nasogastric tube was able to be discontinued. His diet was able to be advanced from clears to solids and he tolerated this well without recurrent nausea vomiting abdominal distention or pain. He was deemed suitable for discharge on hospital day #3 with follow-up with me in the office as needed.  Discharge Exam: Blood pressure 169/76, pulse 64, temperature 97.5 F (36.4 C), temperature source Oral, resp. rate 17, height 5\' 10"  (1.778 m), weight 94.666 kg (208 lb 11.2 oz), SpO2 92 %. He is alert and oriented. His abdomen was soft and nontender. There was no distention.  Disposition: 01-Home or Self Care  Discharge Instructions    Call MD for:  persistant nausea and vomiting    Complete by:  As directed      Diet general    Complete by:  As directed      Discharge instructions    Complete by:  As directed   DISCHARGE INSTRUCTIONS TO PATIENT  REMINDER:  Carry a list of your medications and allergies with you at all times Call your pharmacy at least 1 week in advance to refill prescriptions Do not mix any prescribed pain medicine with alcohol Do not drive any motor vehicles while taking pain  medication. Take medications with food.  Do not retake a pain medication if you vomit after taking it.   Dressing Care Instruction (if applicable):     Follow-up appointments (date to return to physician): Call for appointment with Dr. Sherri Rad, MD at [phone] or [phone]  If need MD on call after hours and on weekends call Hospital operator at [phone] as ask to speak to Surgeon on call for [facility].  Call Surgeon if you have: Temperature greater than 100.4 Persistent nausea and vomiting Severe uncontrolled pain Redness, tenderness, or signs of infection (pain, swelling, redness, odor or green/yellow discharge around the site) Difficulty breathing, headache or visual disturbances Hives Persistent dizziness or light-headedness Extreme fatigue Any other questions or concerns you may have after discharge  In an emergency, call 911 or go to an Emergency Department at a nearby hospital  Diet:  Resume your usual diet.  Avoid spicy, greasy or heavy foods.  If you have nausea or vomiting, go back to liquids.  If you cannot keep liquids down, call your doctor.  Avoid alcohol consumption while on prescription pain medications. Good nutrition promotes healing. Increase fiber and fluids.     I understand and acknowledge receipt of the above instructions.  Patient or Guardian Signature                                                                    Date/Time                                                                                                                                        Physician's or R.N.'s Signature                                                                  Date/Time  The discharge instructions have been reviewed with the patient and/or Family Member/Parent/Guardian.  Patient and/or Family  Member/Parent/Guardian signed and retained a printed copy.     Increase activity slowly    Complete by:  As directed      No wound care    Complete by:  As directed             Medication List    TAKE these medications        aspirin EC 81 MG tablet  Take 81 mg by mouth daily.     cetirizine 10 MG tablet  Commonly known as:  ZYRTEC  Take 10 mg by mouth daily.     simvastatin 20 MG tablet  Commonly known as:  ZOCOR  Take 20 mg by mouth at bedtime.     terazosin 2 MG capsule  Commonly known as:  HYTRIN  Take 2 mg by mouth at bedtime.           Follow-up Information    Follow up with Sherri Rad, MD.   Specialties:  Surgery, Radiology   Why:  As needed   Contact information:   [address] [place] [address] [phone]       Signed: Sherri Rad 11/24/2014, 8:30 AM

## 2014-11-24 NOTE — Discharge Instructions (Signed)
*  Notify your doctor if your symptoms return. *Take all meds as prescribed.  *Drink plenty of fluids. *Monitor your bowel movements and notify MD for any significant changes. *Notify your doctor with any questions or concerns.

## 2014-11-26 ENCOUNTER — Telehealth: Payer: Self-pay | Admitting: Surgery

## 2014-11-26 NOTE — Telephone Encounter (Signed)
Patient D/C Saturday. Stomach hurting, throwing up. Having a hard time eating.

## 2014-11-26 NOTE — Telephone Encounter (Signed)
Returned call to patient at this time. His wife awoke him from sleeping. Pt denies having pain or vomiting. Pt even denies calling office. Upon me asking, he states that no one in household has called our office today.

## 2014-12-02 ENCOUNTER — Encounter: Payer: Self-pay | Admitting: Emergency Medicine

## 2014-12-02 ENCOUNTER — Inpatient Hospital Stay
Admission: EM | Admit: 2014-12-02 | Discharge: 2014-12-08 | DRG: 380 | Disposition: A | Discharge status: Home / Self Care | Payer: Medicare Other | Attending: Surgery | Admitting: Surgery

## 2014-12-02 ENCOUNTER — Emergency Department: Payer: Medicare Other

## 2014-12-02 DIAGNOSIS — K259 Gastric ulcer, unspecified as acute or chronic, without hemorrhage or perforation: Secondary | ICD-10-CM | POA: Diagnosis present

## 2014-12-02 DIAGNOSIS — K254 Chronic or unspecified gastric ulcer with hemorrhage: Secondary | ICD-10-CM | POA: Diagnosis not present

## 2014-12-02 DIAGNOSIS — R1114 Bilious vomiting: Secondary | ICD-10-CM | POA: Diagnosis not present

## 2014-12-02 DIAGNOSIS — R1013 Epigastric pain: Secondary | ICD-10-CM | POA: Diagnosis not present

## 2014-12-02 DIAGNOSIS — K5900 Constipation, unspecified: Secondary | ICD-10-CM | POA: Diagnosis present

## 2014-12-02 DIAGNOSIS — K311 Adult hypertrophic pyloric stenosis: Principal | ICD-10-CM | POA: Diagnosis present

## 2014-12-02 DIAGNOSIS — K2900 Acute gastritis without bleeding: Secondary | ICD-10-CM | POA: Diagnosis not present

## 2014-12-02 DIAGNOSIS — E669 Obesity, unspecified: Secondary | ICD-10-CM | POA: Diagnosis not present

## 2014-12-02 DIAGNOSIS — E43 Unspecified severe protein-calorie malnutrition: Secondary | ICD-10-CM | POA: Insufficient documentation

## 2014-12-02 DIAGNOSIS — I10 Essential (primary) hypertension: Secondary | ICD-10-CM | POA: Diagnosis present

## 2014-12-02 DIAGNOSIS — K279 Peptic ulcer, site unspecified, unspecified as acute or chronic, without hemorrhage or perforation: Secondary | ICD-10-CM | POA: Diagnosis present

## 2014-12-02 DIAGNOSIS — K2960 Other gastritis without bleeding: Secondary | ICD-10-CM | POA: Diagnosis not present

## 2014-12-02 DIAGNOSIS — Z6828 Body mass index (BMI) 28.0-28.9, adult: Secondary | ICD-10-CM

## 2014-12-02 DIAGNOSIS — K2950 Unspecified chronic gastritis without bleeding: Secondary | ICD-10-CM | POA: Diagnosis not present

## 2014-12-02 DIAGNOSIS — K4020 Bilateral inguinal hernia, without obstruction or gangrene, not specified as recurrent: Secondary | ICD-10-CM | POA: Diagnosis present

## 2014-12-02 DIAGNOSIS — B9681 Helicobacter pylori [H. pylori] as the cause of diseases classified elsewhere: Secondary | ICD-10-CM | POA: Diagnosis present

## 2014-12-02 DIAGNOSIS — K429 Umbilical hernia without obstruction or gangrene: Secondary | ICD-10-CM | POA: Diagnosis present

## 2014-12-02 DIAGNOSIS — K5669 Other intestinal obstruction: Secondary | ICD-10-CM | POA: Diagnosis not present

## 2014-12-02 DIAGNOSIS — E780 Pure hypercholesterolemia: Secondary | ICD-10-CM | POA: Diagnosis present

## 2014-12-02 DIAGNOSIS — K5730 Diverticulosis of large intestine without perforation or abscess without bleeding: Secondary | ICD-10-CM | POA: Diagnosis present

## 2014-12-02 DIAGNOSIS — K3189 Other diseases of stomach and duodenum: Secondary | ICD-10-CM | POA: Diagnosis not present

## 2014-12-02 DIAGNOSIS — R112 Nausea with vomiting, unspecified: Secondary | ICD-10-CM | POA: Diagnosis not present

## 2014-12-02 DIAGNOSIS — K921 Melena: Secondary | ICD-10-CM | POA: Diagnosis not present

## 2014-12-02 DIAGNOSIS — Q433 Congenital malformations of intestinal fixation: Secondary | ICD-10-CM | POA: Diagnosis not present

## 2014-12-02 DIAGNOSIS — K267 Chronic duodenal ulcer without hemorrhage or perforation: Secondary | ICD-10-CM | POA: Diagnosis not present

## 2014-12-02 HISTORY — DX: Acute pancreatitis without necrosis or infection, unspecified: K85.90

## 2014-12-02 HISTORY — DX: Benign prostatic hyperplasia without lower urinary tract symptoms: N40.0

## 2014-12-02 HISTORY — DX: Adult hypertrophic pyloric stenosis: K31.1

## 2014-12-02 HISTORY — DX: Essential (primary) hypertension: I10

## 2014-12-02 LAB — COMPREHENSIVE METABOLIC PANEL
ALK PHOS: 83 U/L (ref 38–126)
ALT: 16 U/L — AB (ref 17–63)
AST: 18 U/L (ref 15–41)
Albumin: 4 g/dL (ref 3.5–5.0)
Anion gap: 11 (ref 5–15)
BUN: 15 mg/dL (ref 6–20)
CO2: 29 mmol/L (ref 22–32)
Calcium: 9.1 mg/dL (ref 8.9–10.3)
Chloride: 99 mmol/L — ABNORMAL LOW (ref 101–111)
Creatinine, Ser: 1.16 mg/dL (ref 0.61–1.24)
GFR calc non Af Amer: 59 mL/min — ABNORMAL LOW (ref >60)
Glucose, Bld: 130 mg/dL — ABNORMAL HIGH (ref 65–99)
POTASSIUM: 4 mmol/L (ref 3.5–5.1)
SODIUM: 139 mmol/L (ref 135–145)
Total Bilirubin: 0.7 mg/dL (ref 0.3–1.2)
Total Protein: 7.7 g/dL (ref 6.5–8.1)

## 2014-12-02 LAB — CBC
HEMATOCRIT: 43.7 % (ref 40.0–52.0)
Hemoglobin: 15.1 g/dL (ref 13.0–18.0)
MCH: 32.4 pg (ref 26.0–34.0)
MCHC: 34.5 g/dL (ref 32.0–36.0)
MCV: 93.9 fL (ref 80.0–100.0)
Platelets: 237 10*3/uL (ref 150–440)
RBC: 4.66 MIL/uL (ref 4.40–5.90)
RDW: 13 % (ref 11.5–14.5)
WBC: 9.9 10*3/uL (ref 3.8–10.6)

## 2014-12-02 LAB — LIPASE, BLOOD: LIPASE: 21 U/L — AB (ref 22–51)

## 2014-12-02 MED ORDER — KCL IN DEXTROSE-NACL 20-5-0.45 MEQ/L-%-% IV SOLN
INTRAVENOUS | Status: DC
  Administered 2014-12-02 – 2014-12-05 (×7): via INTRAVENOUS
  Filled 2014-12-02 (×10): qty 1000

## 2014-12-02 MED ORDER — ONDANSETRON HCL 4 MG/2ML IJ SOLN
INTRAMUSCULAR | Status: AC
  Administered 2014-12-02: 4 mg via INTRAVENOUS
  Filled 2014-12-02: qty 2

## 2014-12-02 MED ORDER — ONDANSETRON HCL 4 MG/2ML IJ SOLN
4.0000 mg | Freq: Four times a day (QID) | INTRAMUSCULAR | Status: DC | PRN
  Administered 2014-12-02: 4 mg via INTRAVENOUS
  Filled 2014-12-02: qty 2

## 2014-12-02 MED ORDER — MORPHINE SULFATE 4 MG/ML IJ SOLN
4.0000 mg | INTRAMUSCULAR | Status: DC | PRN
  Administered 2014-12-02: 4 mg via INTRAVENOUS
  Filled 2014-12-02: qty 1

## 2014-12-02 MED ORDER — IOHEXOL 240 MG/ML SOLN
25.0000 mL | Freq: Once | INTRAMUSCULAR | Status: AC | PRN
  Administered 2014-12-02: 25 mL via ORAL

## 2014-12-02 MED ORDER — ONDANSETRON 4 MG PO TBDP
4.0000 mg | ORAL_TABLET | Freq: Once | ORAL | Status: AC | PRN
  Administered 2014-12-02: 4 mg via ORAL
  Filled 2014-12-02: qty 1

## 2014-12-02 MED ORDER — PANTOPRAZOLE SODIUM 40 MG IV SOLR
40.0000 mg | Freq: Every day | INTRAVENOUS | Status: DC
Start: 2014-12-02 — End: 2014-12-03
  Administered 2014-12-02: 40 mg via INTRAVENOUS
  Filled 2014-12-02: qty 40

## 2014-12-02 MED ORDER — ONDANSETRON HCL 4 MG/2ML IJ SOLN
4.0000 mg | Freq: Once | INTRAMUSCULAR | Status: AC
  Administered 2014-12-02: 4 mg via INTRAVENOUS

## 2014-12-02 MED ORDER — IOHEXOL 300 MG/ML  SOLN
100.0000 mL | Freq: Once | INTRAMUSCULAR | Status: AC | PRN
  Administered 2014-12-02: 100 mL via INTRAVENOUS

## 2014-12-02 MED ORDER — ONDANSETRON 4 MG PO TBDP: 4.0000 mg | ORAL_TABLET | Freq: Four times a day (QID) | ORAL | Status: DC | PRN

## 2014-12-02 MED ORDER — ENOXAPARIN SODIUM 40 MG/0.4ML ~~LOC~~ SOLN
40.0000 mg | SUBCUTANEOUS | Status: DC
  Administered 2014-12-02 – 2014-12-07 (×5): 40 mg via SUBCUTANEOUS
  Filled 2014-12-02 (×5): qty 0.4

## 2014-12-02 MED ORDER — SODIUM CHLORIDE 0.9 % IV BOLUS (SEPSIS)
500.0000 mL | INTRAVENOUS | Status: AC
  Administered 2014-12-02: 500 mL via INTRAVENOUS

## 2014-12-02 NOTE — ED Notes (Signed)
Pt with one episode of vomiting

## 2014-12-02 NOTE — H&P (Signed)
[name] is a 80 y.o. male  with several week history of intermittent nausea vomiting and anorexia.  HPI: He was admitted several weeks ago with nausea vomiting change in his bowel habits and anorexia. Workup at this time suggests possible small bowel obstruction. Nasogastric tube placed his plain films demonstrate passage of his CT contrast is discharge follow-up in the office. Since that time is continued to have intermittent symptoms of nausea Hazel vomiting but mostly anorexia. He is noted over the last 2 weeks has had a change in his bowel habits and his stools now dark sticky almost like tar. He's never had any of these problems prior to these 2 episodes.  Denies any history of hepatitis yellow jaundice pancreatitis peptic ulcer disease gallbladder disease or diverticulitis. He has had a colonoscopy which demonstrated diverticulosis. He's never had an EGD. He's not had a colonoscopy recently. He denies any abdominal surgery.  ED workup demonstrated what appeared to be gastric outlet obstruction with a partial small bowel malrotation with the majority of the jejunum in right upper quadrant. There did not appear to be any small bowel obstruction. He does not have an elevated white blood cell count and his hemoglobin was 15. Surgical service was consulted.  Past Medical History  Diagnosis Date  . Bowel obstruction   . Hyperchloremia   . High cholesterol    History reviewed. No pertinent past surgical history. Social History   Social History  . Marital Status: Married    Spouse Name: N/A  . Number of Children: N/A  . Years of Education: N/A   Social History Main Topics  . Smoking status: Never Smoker   . Smokeless tobacco: None  . Alcohol Use: No  . Drug Use: No  . Sexual Activity: No   Other Topics Concern  . None   Social History Narrative     Review of Systems  Constitutional: Positive for weight loss and malaise/fatigue.  HENT: Negative.   Eyes: Negative.    Respiratory: Negative.   Cardiovascular: Negative.   Gastrointestinal: Positive for nausea, vomiting, constipation and blood in stool. Negative for heartburn.  Genitourinary: Negative.   Musculoskeletal: Negative.   Skin: Negative.   Neurological: Negative.   Psychiatric/Behavioral: Negative.      PHYSICAL EXAM: BP 163/93 mmHg  Pulse 86  Temp(Src) 98.1 F (36.7 C) (Oral)  Resp 18  Ht 5\' 11"  (1.803 m)  Wt 202 lb (91.627 kg)  BMI 28.19 kg/m2  SpO2 95%  Physical Exam  Constitutional: He is oriented to person, place, and time. He appears well-developed and well-nourished.  HENT:  Head: Normocephalic and atraumatic.  Eyes: EOM are normal. Pupils are equal, round, and reactive to light.  Neck: Normal range of motion. Neck supple.  Cardiovascular: Regular rhythm and normal heart sounds.   Pulmonary/Chest: Effort normal and breath sounds normal.  Abdominal: Soft. Bowel sounds are normal.  Musculoskeletal: Normal range of motion. He exhibits no edema.  Neurological: He is alert and oriented to person, place, and time.  Skin: Skin is warm and dry.  Psychiatric: He has a normal mood and affect. Judgment normal.   His abdomen exam is benign. He doesn't have any significant abdominal tenderness no rebound or guarding. Does have small umbilical hernia. He does have a small left inguinal hernia and possibly a right inguinal hernia. Both are reducible.  Impression/Plan: I have independently reviewed his CT scan. He does. I have significant gastric outlet obstruction not unchanged from his symptoms several weeks ago.  I'm not certain that the malrotation contributes to the process because he does appear to have some narrowing at the pylorus. With his lack positive stool and change in bowel habits she be concerned about the possibility of gastric L obstruction from peptic ulcer disease. However, malignancy is certainly an option. There is no sign of any bowel ischemia. We'll plan to maintain the  hospital decompressed the stomach and ask for an EGD to look at the distal stomach. He is in agreement with this plan.   [name], MD  12/02/2014, 2:18 PM

## 2014-12-02 NOTE — ED Notes (Signed)
Pt refusing G-tube at this time.  States he will wait to speak with surgery MD.

## 2014-12-02 NOTE — ED Provider Notes (Signed)
[facility] Emergency Department Provider Note  ____________________________________________  Time seen: Approximately 10:06 AM  I have reviewed the triage vital signs and the nursing notes.   HISTORY  Chief Complaint Abdominal Pain    HPI [name] is a 80 y.o. male with a recent history of small bowel malrotation for which she was admitted to the hospital and observed, discharged about one week ago.  He presents with persistent mild to moderate abdominal pain which has been present for about 6 days, since he was discharged from the hospital.  He states that nothing makes it better and that eating too much makes it worse.  He has frequent nausea but no recent vomiting.  He describes the pain as dull and aching.  He has been having less by mouth intake than usual.  He also reports black, tarry stools that stick to the side of the bowl.  He has been constipated and has only had 2 bowel movements over the last week.  He denies chest pain, shortness of breath, fever/chills.   Past Medical History  Diagnosis Date  . Bowel obstruction   . Hyperchloremia   . High cholesterol     Patient Active Problem List   Diagnosis Date Noted  . SBO (small bowel obstruction)   . Small bowel obstruction 11/21/2014  . Generalized abdominal pain   . Benign fibroma of prostate 01/02/2011  . HLD (hyperlipidemia) 01/02/2011  . Adiposity 01/02/2011    History reviewed. No pertinent past surgical history.  Current Outpatient Rx  Name  Route  Sig  Dispense  Refill  . aspirin EC 81 MG tablet   Oral   Take 81 mg by mouth daily.         . cetirizine (ZYRTEC) 10 MG tablet   Oral   Take 10 mg by mouth daily.         . simvastatin (ZOCOR) 20 MG tablet   Oral   Take 20 mg by mouth at bedtime.          Marland Kitchen terazosin (HYTRIN) 2 MG capsule   Oral   Take 2 mg by mouth at bedtime.           Allergies Review of patient's allergies indicates no known allergies.  No  family history on file.  Social History Social History  Substance Use Topics  . Smoking status: Never Smoker   . Smokeless tobacco: None  . Alcohol Use: No    Review of Systems Constitutional: No fever/chills Eyes: No visual changes. ENT: No sore throat. Cardiovascular: Denies chest pain. Respiratory: Denies shortness of breath. Gastrointestinal: Persistent abdominal pain for 6 days with nausea, constipation, and black/tarry stools Genitourinary: Negative for dysuria. Musculoskeletal: Negative for back pain. Skin: Negative for rash. Neurological: Negative for headaches, focal weakness or numbness.  10-point ROS otherwise negative.  ____________________________________________   PHYSICAL EXAM:  VITAL SIGNS: ED Triage Vitals  Enc Vitals Group     BP 12/02/14 0905 163/93 mmHg     Pulse Rate 12/02/14 0905 86     Resp 12/02/14 0905 18     Temp 12/02/14 0905 98.1 F (36.7 C)     Temp Source 12/02/14 0905 Oral     SpO2 12/02/14 0905 95 %     Weight 12/02/14 0905 202 lb (91.627 kg)     Height 12/02/14 0905 5\' 11"  (1.803 m)     Head Cir --      Peak Flow --  Pain Score 12/02/14 0911 5     Pain Loc --      Pain Edu? --      Excl. in [place]? --     Constitutional: Alert and oriented. Well appearing and in no acute distress. Eyes: Conjunctivae are normal. PERRL. EOMI. Head: Atraumatic. Nose: No congestion/rhinnorhea. Mouth/Throat: Mucous membranes are moist.  Oropharynx non-erythematous. Neck: No stridor.   Cardiovascular: Normal rate, regular rhythm. Grossly normal heart sounds.  Good peripheral circulation. Respiratory: Normal respiratory effort.  No retractions. Lungs CTAB. Gastrointestinal: Soft and nontender.  Mild distention. No abdominal bruits. No CVA tenderness. Rectal:  Melena, strongly positive hemoccult Musculoskeletal: No lower extremity tenderness nor edema.  No joint effusions. Neurologic:  Normal speech and language. No gross focal neurologic deficits  are appreciated.  Skin:  Skin is warm, dry and intact. No rash noted. Psychiatric: Mood and affect are normal. Speech and behavior are normal.  ____________________________________________   LABS (all labs ordered are listed, but only abnormal results are displayed)  Labs Reviewed  LIPASE, BLOOD - Abnormal; Notable for the following:    Lipase 21 (*)    All other components within normal limits  COMPREHENSIVE METABOLIC PANEL - Abnormal; Notable for the following:    Chloride 99 (*)    Glucose, Bld 130 (*)    ALT 16 (*)    GFR calc non Af Amer 59 (*)    All other components within normal limits  CBC  URINALYSIS COMPLETEWITH MICROSCOPIC ([facility] ONLY)   ____________________________________________  EKG  Not indicated ____________________________________________  RADIOLOGY  No results found.  ____________________________________________   PROCEDURES  Procedure(s) performed: None  Critical Care performed: No ____________________________________________   INITIAL IMPRESSION / ASSESSMENT AND PLAN / ED COURSE  Pertinent labs & imaging results that were available during my care of the patient were reviewed by me and considered in my medical decision making (see chart for details).  The patient has a concerning history though he currently appears well and nontoxic.  He has no significant abdominal tenderness to palpation.  His persistent symptoms may be representative more of a chronic issue, but given the history of recent malrotation, I will obtain a CT scan with by mouth and IV contrast for further evaluation.  I considered starting with a plain radiograph of the abdomen, but his plain film on his last visit was normal while the CT scan revealed a malrotation.  I believe there is no indication for plain film at this time.  I will hold off on any narcotic pain medicine as the patient complains of constipation and I do not want to make this worse, and he is in no distress at  this time.  I will also do a rectal exam given his history of black and tarry stools which is reportedly a new issue.  ----------------------------------------- 10:48 AM on 12/02/2014 -----------------------------------------  The patient has melena with strongly positive Hemoccult.  Given his recent history of small bowel malrotation, I am concerned about this new development.  The patient has already started his oral contrast.  I will proceed with the CT scan and then discuss the case with Dr. Pat Patrick.  The patient remains hemodynamically stable and in no acute distress.  He is currently tolerating the by mouth contrast.   (Note that documentation was delayed due to multiple ED patients requiring immediate care.)   I spoke with Dr. Pat Patrick by phone and then again in person when he came to the department to personally see the patient.  He will admit the patient for further management of probable gastric outlet obstruction.  The patient initially refused an NG tube when I recommended it but Dr. Pat Patrick will have a tube placed when the patient gets to the floor. ____________________________________________  FINAL CLINICAL IMPRESSION(S) / ED DIAGNOSES  Final diagnoses:  Gastric outlet obstruction      NEW MEDICATIONS STARTED DURING THIS VISIT:  New Prescriptions   No medications on file     Hinda Kehr, MD 12/02/14 1717

## 2014-12-02 NOTE — ED Notes (Signed)
Pt reports generalized abd pain for past 3 weeks. Pt had fluid drawn abd 3 weeks ago. Now with constipation last stool Friday. Now with nausea denies vomiting. abd distended.

## 2014-12-03 ENCOUNTER — Encounter
Admission: EM | Disposition: A | Discharge status: Home / Self Care | Payer: Self-pay | Source: Home / Self Care | Attending: Surgery

## 2014-12-03 ENCOUNTER — Inpatient Hospital Stay: Payer: Medicare Other | Admitting: Certified Registered Nurse Anesthetist

## 2014-12-03 ENCOUNTER — Inpatient Hospital Stay: Payer: Medicare Other

## 2014-12-03 DIAGNOSIS — E43 Unspecified severe protein-calorie malnutrition: Secondary | ICD-10-CM | POA: Insufficient documentation

## 2014-12-03 HISTORY — PX: ESOPHAGOGASTRODUODENOSCOPY: SHX5428

## 2014-12-03 LAB — CBC
HCT: 39.1 % — ABNORMAL LOW (ref 40.0–52.0)
Hemoglobin: 13.6 g/dL (ref 13.0–18.0)
MCH: 33 pg (ref 26.0–34.0)
MCHC: 34.7 g/dL (ref 32.0–36.0)
MCV: 95 fL (ref 80.0–100.0)
PLATELETS: 206 10*3/uL (ref 150–440)
RBC: 4.12 MIL/uL — ABNORMAL LOW (ref 4.40–5.90)
RDW: 12.9 % (ref 11.5–14.5)
WBC: 9.9 10*3/uL (ref 3.8–10.6)

## 2014-12-03 LAB — BASIC METABOLIC PANEL
Anion gap: 5 (ref 5–15)
BUN: 11 mg/dL (ref 6–20)
CO2: 28 mmol/L (ref 22–32)
CREATININE: 1.04 mg/dL (ref 0.61–1.24)
Calcium: 8.2 mg/dL — ABNORMAL LOW (ref 8.9–10.3)
Chloride: 104 mmol/L (ref 101–111)
GFR calc Af Amer: >60 mL/min (ref >60)
Glucose, Bld: 126 mg/dL — ABNORMAL HIGH (ref 65–99)
Potassium: 4.4 mmol/L (ref 3.5–5.1)
SODIUM: 137 mmol/L (ref 135–145)

## 2014-12-03 LAB — PROTIME-INR
INR: 1.12
Prothrombin Time: 14.6 seconds (ref 11.4–15.0)

## 2014-12-03 SURGERY — EGD (ESOPHAGOGASTRODUODENOSCOPY)
Anesthesia: General

## 2014-12-03 MED ORDER — LIDOCAINE HCL (PF) 2 % IJ SOLN
INTRAMUSCULAR | Status: DC | PRN
  Administered 2014-12-03: 60 mg via INTRADERMAL

## 2014-12-03 MED ORDER — HYDRALAZINE HCL 20 MG/ML IJ SOLN: 10.0000 mg | Freq: Four times a day (QID) | INTRAMUSCULAR | Status: DC | PRN

## 2014-12-03 MED ORDER — SODIUM CHLORIDE 0.9 % IV SOLN
INTRAVENOUS | Status: DC
Start: 2014-12-03 — End: 2014-12-03

## 2014-12-03 MED ORDER — PROPOFOL 10 MG/ML IV BOLUS
INTRAVENOUS | Status: DC | PRN
  Administered 2014-12-03: 220 mg via INTRAVENOUS

## 2014-12-03 MED ORDER — ROCURONIUM BROMIDE 100 MG/10ML IV SOLN
INTRAVENOUS | Status: DC | PRN
  Administered 2014-12-03: 5 mg via INTRAVENOUS

## 2014-12-03 MED ORDER — PANTOPRAZOLE SODIUM 40 MG IV SOLR
40.0000 mg | Freq: Two times a day (BID) | INTRAVENOUS | Status: DC
  Administered 2014-12-03 – 2014-12-08 (×10): 40 mg via INTRAVENOUS
  Filled 2014-12-03 (×14): qty 40

## 2014-12-03 MED ORDER — PHENYLEPHRINE HCL 10 MG/ML IJ SOLN
INTRAMUSCULAR | Status: DC | PRN
  Administered 2014-12-03: 200 ug via INTRAVENOUS

## 2014-12-03 MED ORDER — SUCCINYLCHOLINE CHLORIDE 20 MG/ML IJ SOLN
INTRAMUSCULAR | Status: DC | PRN
  Administered 2014-12-03: 100 mg via INTRAVENOUS

## 2014-12-03 MED ORDER — ONDANSETRON HCL 4 MG/2ML IJ SOLN: 4.0000 mg | Freq: Once | INTRAMUSCULAR | Status: DC | PRN

## 2014-12-03 MED ORDER — SODIUM CHLORIDE 0.9 % IV SOLN
INTRAVENOUS | Status: DC
  Administered 2014-12-03 (×2): via INTRAVENOUS

## 2014-12-03 MED ORDER — FENTANYL CITRATE (PF) 100 MCG/2ML IJ SOLN: 25.0000 ug | INTRAMUSCULAR | Status: DC | PRN

## 2014-12-03 NOTE — Op Note (Signed)
[facility] Gastroenterology [name] Procedure Date: 12/03/2014 1:38 PM MRN: [id number] Account #: [id number] Date of Birth: 04/07/1935 Admit Type: Inpatient Age: 80 Room: [facility] ENDO ROOM 4 Gender: Male Note Status: Finalized Procedure:         Upper GI endoscopy Indications:       Epigastric abdominal pain, Abnormal CT of the GI tract,                     gastric outlet obstruction Providers:         Lupita Dawn. Candace Cruise, MD Referring MD:      Rodena Goldmann, MD (Referring MD) Medicines:         General Anesthesia Procedure:         Pre-Anesthesia Assessment:                    - Prior to the procedure, a History and Physical was                     performed, and patient medications, allergies and                     sensitivities were reviewed. The patient's tolerance of                     previous anesthesia was reviewed.                    - The risks and benefits of the procedure and the sedation                     options and risks were discussed with the patient. All                     questions were answered and informed consent was obtained.                    - After reviewing the risks and benefits, the patient was                     deemed in satisfactory condition to undergo the procedure.                    After obtaining informed consent, the endoscope was passed                     under direct vision. Throughout the procedure, the                     patient's blood pressure, pulse, and oxygen saturations                     were monitored continuously. The Olympus GIF-160 endoscope                     (S#. [id number]) was introduced through the mouth, and                     advanced to the pylorus. The upper GI endoscopy was                     performed with difficulty due to presence of food. The  upper GI endoscopy was accomplished without difficulty.                     The patient tolerated the procedure  well. Findings:      The examined esophagus was normal.      Two oozing cratered gastric ulcers were found in the prepyloric region       of the stomach. Tight pylorus due to prepyloric ulcers. Unable to get       scope through. Dilated with 10-12 mm TTS to 88mm. Yet, still cannot get       scope through.      One non-bleeding cratered gastric ulcer was found in the gastric antrum.       Biopsies were taken with a cold forceps for Helicobacter pylori testing.      The examined duodenum was normal. Mucosa looked normal but could not       reach with scope due to pyloric stenosis. Impression:        - Normal esophagus.                    - Gastric ulcers oozing blood.                    - Gastric ulcer with clean base. Biopsied.                    - Normal examined duodenum.                    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 Recommendation:    - Observe patient's clinical course.                    - Continue present medications.                    - The findings and recommendations were discussed with the                     patient.                    - Keep NG suction for another day to suction out food from                     stomach. Start clears tomorrow. If ulcers do not heal                     properly, then GOO may remain and pt may end up requiring                     surgery. Procedure Code(s): --- Professional ---                    [phone], 71, Esophagogastroduodenoscopy, flexible,                     transoral; with biopsy, single or multiple Diagnosis Code(s): --- Professional ---                    K25.4, Chronic or unspecified gastric ulcer with hemorrhage                    K25.9, Gastric ulcer, unspecified as acute or chronic,  without hemorrhage or perforation                    R10.13, Epigastric pain                    R93.3, Abnormal findings on diagnostic imaging of other                     parts of digestive tract CPT copyright  2014 American Medical Association. All rights reserved. The codes documented in this report are preliminary and upon coder review may  be revised to meet current compliance requirements. Hulen Luster, MD 12/03/2014 2:20:02 PM This report has been signed electronically. Number of Addenda: 0 Note Initiated On: 12/03/2014 1:38 PM      [facility]

## 2014-12-03 NOTE — Transfer of Care (Signed)
Immediate Anesthesia Transfer of Care Note  Patient: [name]  Procedure(s) Performed: Procedure(s): ESOPHAGOGASTRODUODENOSCOPY (EGD) (N/A)  Patient Location: PACU  Anesthesia Type:General  Level of Consciousness: sedated  Airway & Oxygen Therapy: Patient Spontanous Breathing and Patient connected to face mask oxygen  Post-op Assessment: Report given to RN  Post vital signs: Reviewed and stable  Last Vitals:  Filed Vitals:   12/03/14 1348  BP: 174/95  Pulse: 73  Temp: 36.8 C  Resp: 16    Complications: No apparent anesthesia complications

## 2014-12-03 NOTE — Anesthesia Procedure Notes (Signed)
Procedure Name: Intubation Date/Time: 12/03/2014 1:53 PM Performed by: Clinton Sawyer Pre-anesthesia Checklist: Patient identified, Emergency Drugs available, Suction available, Timeout performed and Patient being monitored Patient Re-evaluated:Patient Re-evaluated prior to inductionOxygen Delivery Method: Circle system utilized Preoxygenation: Pre-oxygenation with 100% oxygen Intubation Type: IV induction, Rapid sequence and Cricoid Pressure applied Laryngoscope Size: Mac and 4 Grade View: Grade II Tube type: Oral Tube size: 7.0 mm Number of attempts: 1 Airway Equipment and Method: Stylet Placement Confirmation: ETT inserted through vocal cords under direct vision,  CO2 detector,  positive ETCO2 and breath sounds checked- equal and bilateral Secured at: 22 cm Tube secured with: Tape Dental Injury: Teeth and Oropharynx as per pre-operative assessment

## 2014-12-03 NOTE — Op Note (Signed)
Pt seen and examined. Please see C. Celesta Aver' notes. Please see EGD report. Multiple antral and prepyloric ulcers with tight pylorus as a result. Dilated to 41mm. Yet, unable to get scope through. Bx taken for H.pylori. Increase protonix to bid. Keep NPO for suction for 1 more day since large food remaining in stomach. Clears afterwards. If these ulcers do not heal properly, pyloric stenosis may remain despite balloon dilation. If so, may require surgery. Will follow. thanks

## 2014-12-03 NOTE — Anesthesia Preprocedure Evaluation (Addendum)
Anesthesia Evaluation  Patient identified by MRN, date of birth, ID band Patient awake    Reviewed: Allergy & Precautions, NPO status   History of Anesthesia Complications Negative for: history of anesthetic complications  Airway Mallampati: II       Dental  (+) Edentulous Upper, Edentulous Lower   Pulmonary neg pulmonary ROS,          Cardiovascular negative cardio ROS Normal cardiovascular exam    Neuro/Psych    GI/Hepatic negative GI ROS, Neg liver ROS,   Endo/Other  negative endocrine ROS  Renal/GU negative Renal ROS     Musculoskeletal negative musculoskeletal ROS (+)   Abdominal (+) + obese,   Peds  Hematology   Anesthesia Other Findings   Reproductive/Obstetrics                             Anesthesia Physical Anesthesia Plan  ASA: III  Anesthesia Plan: General   Post-op Pain Management:    Induction: Intravenous  Airway Management Planned: Oral ETT  Additional Equipment:   Intra-op Plan:   Post-operative Plan: Extubation in OR  Informed Consent: I have reviewed the patients History and Physical, chart, labs and discussed the procedure including the risks, benefits and alternatives for the proposed anesthesia with the patient or authorized representative who has indicated his/her understanding and acceptance.     Plan Discussed with: CRNA  Anesthesia Plan Comments:         Anesthesia Quick Evaluation

## 2014-12-03 NOTE — Progress Notes (Signed)
Initial Nutrition Assessment  DOCUMENTATION CODES:   Severe malnutrition in context of acute illness/injury  INTERVENTION:  Meals and snacks: Cater to pt preferences   NUTRITION DIAGNOSIS:   Inadequate oral intake related to altered GI function as evidenced by NPO status.    GOAL:   Patient will meet greater than or equal to 90% of their needs    MONITOR:    (Energy intake, digestive system, Electrolyte and renal profile)  REASON FOR ASSESSMENT:   Malnutrition Screening Tool    ASSESSMENT:      Pt admitted with gastric outlet obstruction from possible PUD.  Nausea, vomiting prior to admission  Past Medical History  Diagnosis Date  . Bowel obstruction   . Hyperchloremia   . High cholesterol     Current Nutrition: NPO  Food/Nutrition-Related History: Pt reports for the past 5 days prior to admission, eating bites or less during this time framie   Medications: D5 1/2 NS with KCL at 177ml/hr  Electrolyte/Renal Profile and Glucose Profile:   Recent Labs Lab 12/02/14 0912 12/03/14 0530  NA 139 137  K 4.0 4.4  CL 99* 104  CO2 29 28  BUN 15 11  CREATININE 1.16 1.04  CALCIUM 9.1 8.2*  GLUCOSE 130* 126*   Protein Profile:   Recent Labs Lab 12/02/14 0912  ALBUMIN 4.0    Gastrointestinal Profile: NG tube with 568ml output in last 24 hr, + flatus Last BM:8/4   Nutrition-Focused Physical Exam Findings: Nutrition-Focused physical exam completed. Findings are WDL for fat depletion, muscle depletion, and edema.     Weight Change: Noted 6% weight loss in the last month  Pt reports UBW of 212 pounds 1 month ago, current wait 202 pounds    Diet Order:  Diet NPO time specified Except for: Sips with Meds  Skin:   reviewed   Height:   Ht Readings from Last 1 Encounters:  12/02/14 5\' 11"  (1.803 m)    Weight:   Wt Readings from Last 1 Encounters:  12/02/14 202 lb (91.627 kg)        BMI:  Body mass index is 28.19 kg/(m^2).  Estimated  Nutritional Needs:   Kcal:  BEE 1667 kcals (IF 1.0-1.2, AF 1.3) 2167-268ml/d  Protein:  (1.0-1.2 g/kg) 92-110 g/d  Fluid:  (30-8ml/kg) 2760-3228ml/d  EDUCATION NEEDS:   No education needs identified at this time  HIGH Care Level  Djibril Glogowski B. Zenia Resides, [place], [place] (pager)

## 2014-12-03 NOTE — Progress Notes (Signed)
Subjective: 80 year old male admitted for nausea and vomiting with imaging consistent with gastric outlet obstruction. Patient states that he feels much better today after having NGT decompression overnight.States he has passed flatus since admission. With the exception of the tube down his nose, he states he feels ok.  Vital signs in last 24 hours: Temp:  [97.9 F (36.6 C)-98.2 F (36.8 C)] 98.2 F (36.8 C) (08/15 0742) Pulse Rate:  [65-82] 65 (08/15 0742) Resp:  [12-18] 17 (08/15 0742) BP: (159-182)/(78-99) 175/78 mmHg (08/15 0742) SpO2:  [93 %-97 %] 94 % (08/15 0742) Last BM Date: 11/22/14  Intake/Output from previous day: 08/14 0701 - 08/15 0700 In: 1460 [I.V.:1460] Out: 1150 [Urine:600; Emesis/NG output:550]  Exam:  GEN: NAD RESP: CTA CV: RRR ABD: Soft, NT, mild distension, bowel sounds present. NGT present with about 600 ml since placement. No evidence of peritonitis.  Lab Results:  CBC  Recent Labs  12/02/14 0912 12/03/14 0530  WBC 9.9 9.9  HGB 15.1 13.6  HCT 43.7 39.1*  PLT 237 206   CMP     Component Value Date/Time   NA 137 12/03/2014 0530   NA 138 07/18/2013 0932   K 4.4 12/03/2014 0530   K 4.0 07/18/2013 0932   CL 104 12/03/2014 0530   CL 101 07/18/2013 0932   CO2 28 12/03/2014 0530   CO2 31 07/18/2013 0932   GLUCOSE 126* 12/03/2014 0530   GLUCOSE 121* 07/18/2013 0932   BUN 11 12/03/2014 0530   BUN 23* 07/18/2013 0932   CREATININE 1.04 12/03/2014 0530   CREATININE 1.47* 07/18/2013 0932   CALCIUM 8.2* 12/03/2014 0530   CALCIUM 9.5 07/18/2013 0932   PROT 7.7 12/02/2014 0912   PROT 7.9 07/18/2013 0932   ALBUMIN 4.0 12/02/2014 0912   ALBUMIN 3.9 07/18/2013 0932   AST 18 12/02/2014 0912   AST 13* 07/18/2013 0932   ALT 16* 12/02/2014 0912   ALT 21 07/18/2013 0932   ALKPHOS 83 12/02/2014 0912   ALKPHOS 96 07/18/2013 0932   BILITOT 0.7 12/02/2014 0912   BILITOT 0.4 07/18/2013 0932   GFRNONAA >60 12/03/2014 0530   GFRNONAA 46* 07/18/2013 0932   GFRNONAA >60 06/08/2011 0527   GFRAA >60 12/03/2014 0530   GFRAA 53* 07/18/2013 0932   GFRAA >60 06/08/2011 0527   PT/INR  Recent Labs  12/03/14 0530  LABPROT 14.6  INR 1.12    Studies/Results: Ct Abdomen Pelvis W Contrast  12/02/2014   CLINICAL DATA:  Generalized abdominal pain for 3 weeks.  EXAM: CT ABDOMEN AND PELVIS WITH CONTRAST  TECHNIQUE: Multidetector CT imaging of the abdomen and pelvis was performed using the standard protocol following bolus administration of intravenous contrast.  CONTRAST:  175mL OMNIPAQUE IOHEXOL 300 MG/ML  SOLN  COMPARISON:  CT scan of November 21, 2014.  FINDINGS: Moderate degenerative disc disease is noted at L5-S1. Visualized lung bases are unremarkable.  No gallstones are noted. The liver, spleen and pancreas appear normal. There remains severe gastric distention concerning for gastric outlet obstruction, and endoscopy is recommended for further evaluation. 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 Adrenal glands and kidneys appear normal without hydronephrosis or renal obstruction. Atherosclerosis of abdominal aorta is noted without aneurysm formation. Sigmoid diverticulosis is noted without inflammation. Urinary bladder appears normal. Stable bilateral fat containing inguinal hernias are noted. No significant adenopathy is noted.  IMPRESSION: Atherosclerosis of abdominal aorta without aneurysm formation.  Sigmoid diverticulosis without inflammation.  Stable bilateral fat containing inguinal hernias.  Continued presence of severe gastric distention with persistent narrowing in the region of the gastric antrum and proximal duodenum. This is concerning for gastric outlet obstruction, and endoscopy is recommended for further evaluation.   Electronically Signed   By: Marijo Conception, M.D.   On: 12/02/2014 12:27   Dg Abd Portable 2v  12/03/2014   CLINICAL DATA:  Gastric outlet obstruction  EXAM:  PORTABLE ABDOMEN - 2 VIEW  COMPARISON:  Abdominal CT scan dated December 02, 2014  FINDINGS: Marked gaseous distention of the stomach persists. There is no small or large bowel obstructive pattern. Contrast is present within the colon. There are degenerative changes of the lumbosacral junction.  IMPRESSION: Persistent distention of the stomach consistent with known gastric outlet obstruction. There is no small or large bowel obstructive pattern. There is no evidence of perforation.   Electronically Signed   By: David  Martinique M.D.   On: 12/03/2014 07:36    Assessment/Plan:  80 year old male with apparent gastric outlet obstruction. Given history of tarry stools and lack of prior surgery or masses on imaging to explain his current symptoms it makes peptic ulcer disease the most likely culprit. Have asked for GI to perform EGD to confirm diagnosis. Will only need surgery if unable to resolve with medications if from peptic ulcer disease. Discussed other possible causes with patient who voiced understanding.  Clayburn Pert, MD FACS General Surgeon [facility]

## 2014-12-03 NOTE — OR Nursing (Signed)
Patient being intubated for procedure and will be going to PACU after procedure.

## 2014-12-03 NOTE — Anesthesia Postprocedure Evaluation (Signed)
  Anesthesia Post-op Note  Patient: [name]  Procedure(s) Performed: Procedure(s): ESOPHAGOGASTRODUODENOSCOPY (EGD) (N/A)  Anesthesia type:General  Patient location: PACU  Post pain: Pain level controlled  Post assessment: Post-op Vital signs reviewed, Patient's Cardiovascular Status Stable, Respiratory Function Stable, Patent Airway and No signs of Nausea or vomiting  Post vital signs: Reviewed and stable  Last Vitals:  Filed Vitals:   12/03/14 1427  BP:   Pulse:   Temp: 36.3 C  Resp:     Level of consciousness: awake, alert  and patient cooperative  Complications: No apparent anesthesia complications

## 2014-12-03 NOTE — Consult Note (Signed)
GI Inpatient Consult Note  Reason for Consult: Obstruction   Attending Requesting Consult: Dr. Pat Patrick  History of Present Illness: [name] is a 80 y.o. male  Who reports that he has had nausea and vomiting for the past month.  He reports a vomit as a greenish brown. He denies vomiting blood. He reports he has had epigastric abdominal pain that he describes as a "toothache".  He also reports occasional heartburn.  He does not take a daily PPI.  He reports having very few bowel movements since this began.  He had one this past Thursday that he described as "black sticky tar".  Prior to the onset of the nausea and vomiting he reports his bowel movements occur about every 3 days. He endorses passing gas.  He reports that he believes he had an upper endoscopy in 1971 when he was in [place].  He was told at that time that he had a "hole in his stomach" and was given Zantac and that helped.  He reports that again 3 years ago he was hospitalized with the same type of pain. He is unsure of the treatment he was given.  He was hospitalized earlier this month due to same symptoms and Had a CT scan imaging that demonstrated a small bowel obstruction of the proximal intestine.  Upon his discharge he had begun to tolerate solid foods with nausea, vomiting  Or distention.  He reports that when he came home it just seemed to get worse over the next few days and eventually was unable to keep anything down.  Past Medical History:  Past Medical History  Diagnosis Date  . Bowel obstruction   . Hyperchloremia   . High cholesterol     Problem List: Patient Active Problem List   Diagnosis Date Noted  . Gastric outlet obstruction 12/02/2014  . SBO (small bowel obstruction)   . Small bowel obstruction 11/21/2014  . Generalized abdominal pain   . Benign fibroma of prostate 01/02/2011  . HLD (hyperlipidemia) 01/02/2011  . Adiposity 01/02/2011    Past Surgical History: History reviewed. No pertinent past  surgical history.  Allergies: No Known Allergies  Home Medications: Prescriptions prior to admission  Medication Sig Dispense Refill Last Dose  . aspirin EC 81 MG tablet Take 81 mg by mouth daily.   11/29/2014  . cetirizine (ZYRTEC) 10 MG tablet Take 10 mg by mouth daily.   11/29/2014  . simvastatin (ZOCOR) 20 MG tablet Take 20 mg by mouth at bedtime.    11/29/2014  . terazosin (HYTRIN) 2 MG capsule Take 2 mg by mouth at bedtime.   11/29/2014   Home medication reconciliation was completed with the patient.   Scheduled Inpatient Medications:   . enoxaparin (LOVENOX) injection  40 mg Subcutaneous Q24H  . pantoprazole (PROTONIX) IV  40 mg Intravenous QHS    Continuous Inpatient Infusions:   . dextrose 5 % and 0.45 % NaCl with KCl 20 mEq/L 100 mL/hr at 12/03/14 0951    PRN Inpatient Medications:  morphine injection, ondansetron **OR** ondansetron (ZOFRAN) IV  Family History: family history is not on file.   Social History:   reports that he has never smoked. He does not have any smokeless tobacco history on file. He reports that he does not drink alcohol or use illicit drugs.    Review of Systems: Constitutional: Weight is stable.  Eyes: No changes in vision. ENT: No oral lesions, sore throat.  GI: see HPI.  Heme/Lymph: No easy bruising.  CV: No chest pain.  GU: No hematuria.  Integumentary: No rashes.  Neuro: No headaches.  Psych: No depression/anxiety.  Endocrine: No heat/cold intolerance.  Allergic/Immunologic: No urticaria.  Resp: No cough, SOB.  Musculoskeletal: No joint swelling.    Physical Examination: BP 175/78 mmHg  Pulse 65  Temp(Src) 98.2 F (36.8 C) (Oral)  Resp 17  Ht 5\' 11"  (1.803 m)  Wt 91.627 kg (202 lb)  BMI 28.19 kg/m2  SpO2 94% Gen: NAD, alert and oriented x 4 HEENT: PEERLA, EOMI, Neck: supple, no JVD or thyromegaly Chest: CTA bilaterally, no wheezes, crackles, or other adventitious sounds CV: RRR, no m/g/c/r Abd: soft, NT,slightly  distended, hyperactive BS in all four quadrants; no HSM, guarding, ridigity, or rebound tenderness Ext: no edema, well perfused with 2+ pulses, Skin: no rash or lesions noted Lymph: no LAD  Data: Lab Results  Component Value Date   WBC 9.9 12/03/2014   HGB 13.6 12/03/2014   HCT 39.1* 12/03/2014   MCV 95.0 12/03/2014   PLT 206 12/03/2014    Recent Labs Lab 12/02/14 0912 12/03/14 0530  HGB 15.1 13.6   Lab Results  Component Value Date   NA 137 12/03/2014   K 4.4 12/03/2014   CL 104 12/03/2014   CO2 28 12/03/2014   BUN 11 12/03/2014   CREATININE 1.04 12/03/2014   Lab Results  Component Value Date   ALT 16* 12/02/2014   AST 18 12/02/2014   ALKPHOS 83 12/02/2014   BILITOT 0.7 12/02/2014    Recent Labs Lab 12/03/14 0530  INR 1.12   Imaging:  CLINICAL DATA: Increasing mid abdominal pain over 2 weeks worse overnight associated with multiple episodes of vomiting, has had constipation which improved following suppository yesterday, black stool, takes aspirin and iron supplements, history of ileus, bowel obstruction normal appendix.  Descending and sigmoid colonic diverticulosis without evidence of diverticulitis.  Distended stomach containing fluid and food debris.  EXAM: CT ABDOMEN AND PELVIS WITH CONTRAST  TECHNIQUE: Multidetector CT imaging of the abdomen and pelvis was performed using the standard protocol following bolus administration of intravenous contrast. Sagittal and coronal MPR images reconstructed from axial data set.  CONTRAST: 182mL OMNIPAQUE IOHEXOL 300 MG/ML SOLN, 20mL OMNIPAQUE IOHEXOL 240 MG/ML SOLN  COMPARISON: 06/06/2011  FINDINGS: Lung bases clear.  6 mm LEFT lobe liver cyst image 13.  Liver, gallbladder, spleen, pancreas, kidneys, and adrenal glands normal appearance.  Normal appendix.  Distended stomach containing fluid, contrast and food debris with decompressed duodenum.  Diverticulosis of descending and  sigmoid colon without evidence of diverticulitis.  Partial small bowel malrotation, proximal jejunum located in RIGHT upper quadrant.  Remaining bowel loops unremarkable.  Large BILATERAL inguinal hernias containing fat.  No mass, adenopathy, free air, free fluid or inflammatory process.  Small umbilical hernia containing fat.  Unremarkable bladder and ureters with minimal prostatic enlargement.  Few scattered atherosclerotic calcifications.  IMPRESSION: Distal colonic diverticulosis without evidence of diverticulitis.  Partial small bowel malrotation.  Distended stomach, also noted on the prior exam of 06/06/2011 ; this could reflect a component of chronic gastric outlet obstruction.  Large BILATERAL inguinal and small umbilical hernias containing fat.   Electronically Signed  By: Lavonia Dana M.D.  On: 11/21/2014 14:51  CLINICAL DATA: Generalized abdominal pain for 3 weeks.  EXAM: CT ABDOMEN AND PELVIS WITH CONTRAST  TECHNIQUE: Multidetector CT imaging of the abdomen and pelvis was performed using the standard protocol following bolus administration of intravenous contrast.  CONTRAST: 173mL OMNIPAQUE IOHEXOL 300 MG/ML SOLN  COMPARISON: CT scan of November 21, 2014.  FINDINGS: Moderate degenerative disc disease is noted at L5-S1. Visualized lung bases are unremarkable.  No gallstones are noted. The liver, spleen and pancreas appear normal. There remains severe gastric distention concerning for gastric outlet obstruction, and endoscopy is recommended for further evaluation. 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 Adrenal glands and kidneys appear normal without hydronephrosis or renal obstruction. Atherosclerosis of abdominal aorta is noted without aneurysm formation. Sigmoid diverticulosis is noted without inflammation. Urinary bladder appears normal. Stable  bilateral fat containing inguinal hernias are noted. No significant adenopathy is noted.  IMPRESSION: Atherosclerosis of abdominal aorta without aneurysm formation.  Sigmoid diverticulosis without inflammation.  Stable bilateral fat containing inguinal hernias.  Continued presence of severe gastric distention with persistent narrowing in the region of the gastric antrum and proximal duodenum. This is concerning for gastric outlet obstruction, and endoscopy is recommended for further evaluation.   Electronically Signed  By: Marijo Conception, M.D.  On: 12/02/2014 12:27  CLINICAL DATA: Gastric outlet obstruction  EXAM: PORTABLE ABDOMEN - 2 VIEW  COMPARISON: Abdominal CT scan dated December 02, 2014  FINDINGS: Marked gaseous distention of the stomach persists. There is no small or large bowel obstructive pattern. Contrast is present within the colon. There are degenerative changes of the lumbosacral junction.  IMPRESSION: Persistent distention of the stomach consistent with known gastric outlet obstruction. There is no small or large bowel obstructive pattern. There is no evidence of perforation.   Electronically Signed  By: David Martinique M.D.  On: 12/03/2014 07:36  Assessment/Plan: Mr. Premo is a 80 y.o. male with obstruction with nausea and vomiting.  Mr. Rappaport disclosed a history of possible gastric ulcers.  He does not take a daily PPI.  Recommendations: We will proceed with an upper endoscopy for further evaluation.  The procedure, indication for and possible complications of, were discussed with the patient.  He is in agreement.  We will continue to follow with you.  Thank you for the consult. Please call with questions or concerns.  Salvadore Farber, PA-C  I personally performed these services.

## 2014-12-03 NOTE — Consult Note (Signed)
Patient Demographics  [name], is a 80 y.o. male   MRN: [id number]   DOB - 1935/03/03  Admit Date - 12/02/2014    Outpatient Primary MD for the patient is Leata Mouse, NP  Consult requested in the Hospital by [name], MD, On 12/03/2014    Reason for consult ;management of htn   With History of -  Past Medical History  Diagnosis Date  . Bowel obstruction   . Hyperchloremia   . High cholesterol       History reviewed. No pertinent past surgical history.  in for   Chief Complaint  Patient presents with  . Abdominal Pain     HPI  [name]  is a 80 y.o. male, with h/o htn. hlp , admitted to Surgical service for ,nausea,vomiting.found to have  Gastric out let obstruction,we are consulted for Management of htn.pt went to EGD.he is NPO now.His BP  Is in range of 180/82.  Review of Systems    In addition to the HPI above,  No Fever-chills, No Headache, No changes with Vision or hearing, No problems swallowing food or Liquids, No Chest pain, Cough or Shortness of Breath, Nausea,vomtiing,tarry stool. No Blood in stool or Urine, No dysuria, No new skin rashes or bruises, No new joints pains-aches,  No new weakness, tingling, numbness in any extremity, No recent weight gain or loss, No polyuria, polydypsia or polyphagia, No significant Mental Stressors.  A full 10 point Review of Systems was done, except as stated above, all other Review of Systems were negative.   Social History Social History  Substance Use Topics  . Smoking status: Never Smoker   . Smokeless tobacco: Not on file  . Alcohol Use: No    Family History History reviewed. No pertinent family history.  Prior to Admission medications   Medication Sig Start Date End Date Taking? Authorizing Provider  aspirin EC 81 MG tablet  Take 81 mg by mouth daily.   Yes Historical Provider, MD    Anti-infectives    None      Scheduled Meds: . [MAR Hold] enoxaparin (LOVENOX) injection  40 mg Subcutaneous Q24H  . [MAR Hold] pantoprazole (PROTONIX) IV  40 mg Intravenous QHS  . pantoprazole (PROTONIX) IV  40 mg Intravenous Q12H   Continuous Infusions: . sodium chloride 50 mL/hr at 12/03/14 1357  . dextrose 5 % and 0.45 % NaCl with KCl 20 mEq/L 100 mL/hr at 12/03/14 0951   PRN Meds:.[MAR Hold]  morphine injection, [MAR Hold] ondansetron **OR** [MAR Hold] ondansetron (ZOFRAN) IV  No Known Allergies  Physical Exam  Vitals  Blood pressure 174/95, pulse 73, temperature 98.2 F (36.8 C), temperature source Oral, resp. rate 16, height 5\' 11"  (1.803 m), weight 91.627 kg (202 lb), SpO2 96 %.   1. General ,seen in endoscopy suite, 2. Normal affect and insight, Not Suicidal or Homicidal, Awake Alert, Oriented X 3.  3. No F.N deficits, ALL C.Nerves Intact, Strength 5/5 all 4 extremities, Sensation intact all  4 extremities, Plantars down going.  4. Ears and Eyes appear Normal, Conjunctivae clear, PERRLA. Moist Oral Mucosa.  5. Supple Neck, No JVD, No cervical lymphadenopathy appriciated, No Carotid Bruits.  6. Symmetrical Chest wall movement, Good air movement bilaterally, CTAB.  7. RRR, No Gallops, Rubs or Murmurs, No Parasternal Heave.  8. Positive Bowel Sounds, Abdomen Soft, No tenderness, No organomegaly appriciated,No rebound -guarding or rigidity.small umbilical hernia  Present,  9.  No Cyanosis, Normal Skin Turgor, No Skin Rash or Bruise.  10. Good muscle tone,  joints appear normal , no effusions, Normal ROM.  11. No Palpable Lymph Nodes in Neck or Axillae    Data Review  CBC  Recent Labs Lab 12/02/14 0912 12/03/14 0530  WBC 9.9 9.9  HGB 15.1 13.6  HCT 43.7 39.1*  PLT 237 206  MCV 93.9 95.0  MCH 32.4 33.0  MCHC 34.5 34.7  RDW 13.0 12.9    ------------------------------------------------------------------------------------------------------------------  Chemistries   Recent Labs Lab 12/02/14 0912 12/03/14 0530  NA 139 137  K 4.0 4.4  CL 99* 104  CO2 29 28  GLUCOSE 130* 126*  BUN 15 11  CREATININE 1.16 1.04  CALCIUM 9.1 8.2*  AST 18  --   ALT 16*  --   ALKPHOS 83  --   BILITOT 0.7  --    ------------------------------------------------------------------------------------------------------------------ estimated creatinine clearance is 68.8 mL/min (by C-G formula based on Cr of 1.04). ------------------------------------------------------------------------------------------------------------------ No results for input(s): TSH, T4TOTAL, T3FREE, THYROIDAB in the last 72 hours.  Invalid input(s): FREET3   Coagulation profile  Recent Labs Lab 12/03/14 0530  INR 1.12   ------------------------------------------------------------------------------------------------------------------- No results for input(s): DDIMER in the last 72 hours. -------------------------------------------------------------------------------------------------------------------  Cardiac Enzymes No results for input(s): CKMB, TROPONINI, MYOGLOBIN in the last 168 hours.  Invalid input(s): CK ------------------------------------------------------------------------------------------------------------------ Invalid input(s): POCBNP   ---------------------------------------------------------------------------------------------------------------  Urinalysis    Component Value Date/Time   COLORURINE YELLOW* 11/21/2014 [place] 07/18/2013 0931   APPEARANCEUR CLEAR* 11/21/2014 1415   APPEARANCEUR HAZY 07/18/2013 0931   LABSPEC 1.024 11/21/2014 1415   LABSPEC >=1.030 07/18/2013 0931   PHURINE 6.0 11/21/2014 1415   PHURINE 6.0 07/18/2013 0931   GLUCOSEU 50* 11/21/2014 1415   GLUCOSEU NEGATIVE 07/18/2013 0931   HGBUR  NEGATIVE 11/21/2014 1415   HGBUR NEGATIVE 07/18/2013 0931   BILIRUBINUR NEGATIVE 11/21/2014 1415   BILIRUBINUR 1+ 07/18/2013 [place] 11/21/2014 1415   KETONESUR NEGATIVE 07/18/2013 0931   PROTEINUR 30* 11/21/2014 1415   PROTEINUR TRACE 07/18/2013 0931   NITRITE NEGATIVE 11/21/2014 1415   NITRITE NEGATIVE 07/18/2013 0931   LEUKOCYTESUR NEGATIVE 11/21/2014 1415   LEUKOCYTESUR NEGATIVE 07/18/2013 0931     Imaging results:   Ct Abdomen Pelvis W Contrast  12/02/2014   CLINICAL DATA:  Generalized abdominal pain for 3 weeks.  EXAM: CT ABDOMEN AND PELVIS WITH CONTRAST  TECHNIQUE: Multidetector CT imaging of the abdomen and pelvis was performed using the standard protocol following bolus administration of intravenous contrast.  CONTRAST:  168mL OMNIPAQUE IOHEXOL 300 MG/ML  SOLN  COMPARISON:  CT scan of November 21, 2014.  FINDINGS: Moderate degenerative disc disease is noted at L5-S1. Visualized lung bases are unremarkable.  No gallstones are noted. The liver, spleen and pancreas appear normal. There remains severe gastric distention concerning for gastric outlet obstruction, and endoscopy is recommended for further evaluation. 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 Adrenal glands and kidneys appear normal  without hydronephrosis or renal obstruction. Atherosclerosis of abdominal aorta is noted without aneurysm formation. Sigmoid diverticulosis is noted without inflammation. Urinary bladder appears normal. Stable bilateral fat containing inguinal hernias are noted. No significant adenopathy is noted.  IMPRESSION: Atherosclerosis of abdominal aorta without aneurysm formation.  Sigmoid diverticulosis without inflammation.  Stable bilateral fat containing inguinal hernias.  Continued presence of severe gastric distention with persistent narrowing in the region of the gastric antrum and proximal duodenum. This is  concerning for gastric outlet obstruction, and endoscopy is recommended for further evaluation.   Electronically Signed   By: Marijo Conception, M.D.   On: 12/02/2014 12:27   Dg Abd Portable 2v  12/03/2014   CLINICAL DATA:  Gastric outlet obstruction  EXAM: PORTABLE ABDOMEN - 2 VIEW  COMPARISON:  Abdominal CT scan dated December 02, 2014  FINDINGS: Marked gaseous distention of the stomach persists. There is no small or large bowel obstructive pattern. Contrast is present within the colon. There are degenerative changes of the lumbosacral junction.  IMPRESSION: Persistent distention of the stomach consistent with known gastric outlet obstruction. There is no small or large bowel obstructive pattern. There is no evidence of perforation.   Electronically Signed   By: David  Martinique M.D.   On: 12/03/2014 07:36       Assessment & Plan  Active Problems:   Gastric outlet obstruction   Protein-calorie malnutrition, severe  1.HTN;now as he is NPO,recommend giving hydralazine 10 mg to 20 mg q4hrs  If bp above 160/90. 2.nausea/vomting with gastric  Out let obstruction due to  Multiple prepyloric ulcers;continue NPO,IV PP,If these ulcers do not heal properly, pyloric stenosis may remain despite balloon dilation. If so, may require surgery 3.continue IV fluids for now.    DVT Prophylaxis Heparin -  Lovenox  AM Labs Ordered, also please review Full Orders   Total time spent;50 min  Thank you for the consult, we will follow the patient with you in the Hospital.   [facility] M.D on 12/03/2014 at 2:24 PM

## 2014-12-04 ENCOUNTER — Encounter: Payer: Self-pay | Admitting: Gastroenterology

## 2014-12-04 NOTE — Consult Note (Signed)
  GI Inpatient Follow-up Note  Patient Identification: [name] is a 80 y.o. male  Subjective: Feels better with NG suction. Surgery input appreciated.  Scheduled Inpatient Medications:  . enoxaparin (LOVENOX) injection  40 mg Subcutaneous Q24H  . pantoprazole (PROTONIX) IV  40 mg Intravenous Q12H    Continuous Inpatient Infusions:   . dextrose 5 % and 0.45 % NaCl with KCl 20 mEq/L 100 mL/hr at 12/04/14 1003    PRN Inpatient Medications:  hydrALAZINE, morphine injection, ondansetron **OR** ondansetron (ZOFRAN) IV  Review of Systems: Constitutional: Weight is stable.  Eyes: No changes in vision. ENT: No oral lesions, sore throat.  GI: see HPI.  Heme/Lymph: No easy bruising.  CV: No chest pain.  GU: No hematuria.  Integumentary: No rashes.  Neuro: No headaches.  Psych: No depression/anxiety.  Endocrine: No heat/cold intolerance.  Allergic/Immunologic: No urticaria.  Resp: No cough, SOB.  Musculoskeletal: No joint swelling.    Physical Examination: BP 154/81 mmHg  Pulse 68  Temp(Src) 98 F (36.7 C) (Oral)  Resp 17  Ht 5\' 11"  (1.803 m)  Wt 91.627 kg (202 lb)  BMI 28.19 kg/m2  SpO2 94% Gen: NAD, alert and oriented x 4 HEENT: PEERLA, EOMI, Neck: supple, no JVD or thyromegaly Chest: CTA bilaterally, no wheezes, crackles, or other adventitious sounds CV: RRR, no m/g/c/r Abd: soft, NT, ND, +BS in all four quadrants; no HSM, guarding, ridigity, or rebound tenderness Ext: no edema, well perfused with 2+ pulses, Skin: no rash or lesions noted Lymph: no LAD  Data: Lab Results  Component Value Date   WBC 9.9 12/03/2014   HGB 13.6 12/03/2014   HCT 39.1* 12/03/2014   MCV 95.0 12/03/2014   PLT 206 12/03/2014    Recent Labs Lab 12/02/14 0912 12/03/14 0530  HGB 15.1 13.6   Lab Results  Component Value Date   NA 137 12/03/2014   K 4.4 12/03/2014   CL 104 12/03/2014   CO2 28 12/03/2014   BUN 11 12/03/2014   CREATININE 1.04 12/03/2014   Lab Results   Component Value Date   ALT 16* 12/02/2014   AST 18 12/02/2014   ALKPHOS 83 12/02/2014   BILITOT 0.7 12/02/2014    Recent Labs Lab 12/03/14 0530  INR 1.12   Assessment/Plan: [name] is a 80 y.o. male with gastric outlet obstruction from gastric ulcers.  Recommendations: Could try cramping NG and see how he feels. If remains stable, can try clear liquid diet. Please call with questions or concerns.  [name], Jonathan Dawn, MD

## 2014-12-04 NOTE — Care Management Important Message (Signed)
Important Message  Patient Details  Name: NABEEL GLADSON MRN: [id number] Date of Birth: 06-11-1934   Medicare Important Message Given:  Yes-second notification given    Darius Bump Allmond 12/04/2014, 10:53 AM

## 2014-12-04 NOTE — Progress Notes (Signed)
1 Day Post-Op   Subjective: 80 year old male with gastric outlet obstruction. Patient underwent EGD yesterday which showed peptic ulcer disease which is likely responsible for his gastric outlet obstruction. He has remained with NG tube decompression which has allowed continued resolution of his symptoms. He denies any nausea or pain.  Vital signs in last 24 hours: Temp:  [97.4 F (36.3 C)-98.4 F (36.9 C)] 98 F (36.7 C) (08/16 0747) Pulse Rate:  [62-86] 68 (08/16 0747) Resp:  [14-17] 17 (08/16 0747) BP: (154-174)/(76-95) 154/81 mmHg (08/16 0747) SpO2:  [93 %-100 %] 94 % (08/16 0747) Last BM Date: 11/29/14  Intake/Output from previous day: 08/15 0701 - 08/16 0700 In: 2761.7 [I.V.:2761.7] Out: 1910 [Urine:1500; Emesis/NG output:410]  GI: soft, non-tender; bowel sounds normal; no masses,  no organomegaly  NG tube in place with 400 cc of dark output within the canister.  Lab Results:  CBC  Recent Labs  12/02/14 0912 12/03/14 0530  WBC 9.9 9.9  HGB 15.1 13.6  HCT 43.7 39.1*  PLT 237 206   CMP     Component Value Date/Time   NA 137 12/03/2014 0530   NA 138 07/18/2013 0932   K 4.4 12/03/2014 0530   K 4.0 07/18/2013 0932   CL 104 12/03/2014 0530   CL 101 07/18/2013 0932   CO2 28 12/03/2014 0530   CO2 31 07/18/2013 0932   GLUCOSE 126* 12/03/2014 0530   GLUCOSE 121* 07/18/2013 0932   BUN 11 12/03/2014 0530   BUN 23* 07/18/2013 0932   CREATININE 1.04 12/03/2014 0530   CREATININE 1.47* 07/18/2013 0932   CALCIUM 8.2* 12/03/2014 0530   CALCIUM 9.5 07/18/2013 0932   PROT 7.7 12/02/2014 0912   PROT 7.9 07/18/2013 0932   ALBUMIN 4.0 12/02/2014 0912   ALBUMIN 3.9 07/18/2013 0932   AST 18 12/02/2014 0912   AST 13* 07/18/2013 0932   ALT 16* 12/02/2014 0912   ALT 21 07/18/2013 0932   ALKPHOS 83 12/02/2014 0912   ALKPHOS 96 07/18/2013 0932   BILITOT 0.7 12/02/2014 0912   BILITOT 0.4 07/18/2013 0932   GFRNONAA >60 12/03/2014 0530   GFRNONAA 46* 07/18/2013 0932   GFRNONAA >60 06/08/2011 0527   GFRAA >60 12/03/2014 0530   GFRAA 53* 07/18/2013 0932   GFRAA >60 06/08/2011 0527   PT/INR  Recent Labs  12/03/14 0530  LABPROT 14.6  INR 1.12    Studies/Results: Ct Abdomen Pelvis W Contrast  12/02/2014   CLINICAL DATA:  Generalized abdominal pain for 3 weeks.  EXAM: CT ABDOMEN AND PELVIS WITH CONTRAST  TECHNIQUE: Multidetector CT imaging of the abdomen and pelvis was performed using the standard protocol following bolus administration of intravenous contrast.  CONTRAST:  120mL OMNIPAQUE IOHEXOL 300 MG/ML  SOLN  COMPARISON:  CT scan of November 21, 2014.  FINDINGS: Moderate degenerative disc disease is noted at L5-S1. Visualized lung bases are unremarkable.  No gallstones are noted. The liver, spleen and pancreas appear normal. There remains severe gastric distention concerning for gastric outlet obstruction, and endoscopy is recommended for further evaluation. 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 Adrenal glands and kidneys appear normal without hydronephrosis or renal obstruction. Atherosclerosis of abdominal aorta is noted without aneurysm formation. Sigmoid diverticulosis is noted without inflammation. Urinary bladder appears normal. Stable bilateral fat containing inguinal hernias are noted. No significant adenopathy is noted.  IMPRESSION: Atherosclerosis of abdominal aorta without aneurysm formation.  Sigmoid diverticulosis without inflammation.  Stable bilateral  fat containing inguinal hernias.  Continued presence of severe gastric distention with persistent narrowing in the region of the gastric antrum and proximal duodenum. This is concerning for gastric outlet obstruction, and endoscopy is recommended for further evaluation.   Electronically Signed   By: Marijo Conception, M.D.   On: 12/02/2014 12:27   Dg Abd Portable 2v  12/03/2014   CLINICAL DATA:  Gastric outlet obstruction  EXAM:  PORTABLE ABDOMEN - 2 VIEW  COMPARISON:  Abdominal CT scan dated December 02, 2014  FINDINGS: Marked gaseous distention of the stomach persists. There is no small or large bowel obstructive pattern. Contrast is present within the colon. There are degenerative changes of the lumbosacral junction.  IMPRESSION: Persistent distention of the stomach consistent with known gastric outlet obstruction. There is no small or large bowel obstructive pattern. There is no evidence of perforation.   Electronically Signed   By: David  Martinique M.D.   On: 12/03/2014 07:36    Assessment/Plan: 80 year old male with gastric outlet obstruction secondary to peptic ulcer disease. Discussed that we would give medical management a trial over the next 3-5 days. If his gastric outlet obstruction resolves we'll then transitioned to oral medications. If his gastric outlet obstruction does not resolve on medication discussed that surgery may be required for bypass of the obstruction. Patient voiced understanding and that we would discuss this option later in the week it became apparent that he would require surgery. He is otherwise doing well and happy with his current care. Continue NG tube decompression and IV medications for acid suppression. We'll follow up on H. pylori labs. Appreciate GI and internal medicine assistance with his medical problems.  Clayburn Pert, MD FACS General Surgeon [facility]

## 2014-12-04 NOTE — Consult Note (Signed)
Learned at [facility] NAME: [name]    MR#:  [id number]  DATE OF BIRTH:  [birth year]  SUBJECTIVE:seen at bedside. hehas an NG tube. We're consulted for management of hypertension. Denies nausea or vomiting. BP is acceptable in 150 range.  CHIEF COMPLAINT:   Chief Complaint  Patient presents with  . Abdominal Pain    REVIEW OF SYSTEMS:    Review of Systems  Constitutional: Negative for fever and chills.  HENT: Negative for hearing loss.   Eyes: Negative for blurred vision, double vision and photophobia.  Respiratory: Negative for cough, hemoptysis and shortness of breath.   Cardiovascular: Negative for palpitations, orthopnea and leg swelling.  Gastrointestinal: Negative for vomiting, abdominal pain and diarrhea.  Genitourinary: Negative for dysuria and urgency.  Musculoskeletal: Negative for myalgias and neck pain.  Skin: Negative for rash.  Neurological: Negative for dizziness, focal weakness, seizures, weakness and headaches.  Psychiatric/Behavioral: Negative for memory loss. The patient does not have insomnia.     Nutrition: ng tuibe Tolerating Diet: Tolerating PT:      DRUG ALLERGIES:  No Known Allergies  VITALS:  Blood pressure 154/81, pulse 68, temperature 98 F (36.7 C), temperature source Oral, resp. rate 17, height 5\' 11"  (1.803 m), weight 91.627 kg (202 lb), SpO2 94 %.  PHYSICAL EXAMINATION:   Physical Exam  GENERAL:  80 y.o.-year-old patient lying in the bed with no acute distress.  EYES: Pupils equal, round, reactive to light and accommodation. No scleral icterus. Extraocular muscles intact.  HEENT: Head atraumatic, normocephalic. Oropharynx and nasopharynx clear. NG tube is present NECK:  Supple, no jugular venous distention. No thyroid enlargement, no tenderness.  LUNGS: Normal breath sounds bilaterally, no wheezing, rales,rhonchi or crepitation. No use of accessory muscles of respiration.   CARDIOVASCULAR: S1, S2 normal. No murmurs, rubs, or gallops.  ABDOMEN: Soft, nontender, nondistended. Bowel sounds present. No organomegaly or mass.  EXTREMITIES: No pedal edema, cyanosis, or clubbing.  NEUROLOGIC: Cranial nerves II through XII are intact. Muscle strength 5/5 in all extremities. Sensation intact. Gait not checked.  PSYCHIATRIC: The patient is alert and oriented x 3.  SKIN: No obvious rash, lesion, or ulcer.    LABORATORY PANEL:   CBC  Recent Labs Lab 12/03/14 0530  WBC 9.9  HGB 13.6  HCT 39.1*  PLT 206   ------------------------------------------------------------------------------------------------------------------  Chemistries   Recent Labs Lab 12/02/14 0912 12/03/14 0530  NA 139 137  K 4.0 4.4  CL 99* 104  CO2 29 28  GLUCOSE 130* 126*  BUN 15 11  CREATININE 1.16 1.04  CALCIUM 9.1 8.2*  AST 18  --   ALT 16*  --   ALKPHOS 83  --   BILITOT 0.7  --    ------------------------------------------------------------------------------------------------------------------  Cardiac Enzymes No results for input(s): TROPONINI in the last 168 hours. ------------------------------------------------------------------------------------------------------------------  RADIOLOGY:  Dg Abd Portable 2v  12/03/2014   CLINICAL DATA:  Gastric outlet obstruction  EXAM: PORTABLE ABDOMEN - 2 VIEW  COMPARISON:  Abdominal CT scan dated December 02, 2014  FINDINGS: Marked gaseous distention of the stomach persists. There is no small or large bowel obstructive pattern. Contrast is present within the colon. There are degenerative changes of the lumbosacral junction.  IMPRESSION: Persistent distention of the stomach consistent with known gastric outlet obstruction. There is no small or large bowel obstructive pattern. There is no evidence of perforation.   Electronically Signed   By: David  Martinique M.D.   On: 12/03/2014 07:36  ASSESSMENT AND PLAN:   Active Problems:    Gastric outlet obstruction   Protein-calorie malnutrition, severe   #1 gastric outlet obstruction secondary to gastric ulcers. Continue IV PPIs, NG tube. Patient to has no nausea. GI and surgery are following. According to the GI can be started on clear liquids and clamp  NG tube. #2 hypertension controlled continue IV hydralazine when necessary. If patient is started on diet , then follow the BP trend and start up by mouth hydralazine.     All the records are reviewed and case discussed with Care Management/Social Workerr. Management plans discussed with the patient, family and they are in agreement.  CODE STATUS: full  TOTAL TIME TAKING CARE OF THIS PATIENT: 35 minutes.   POSSIBLE D/C IN 1-2 DAYS, DEPENDING ON CLINICAL CONDITION.   Epifanio Lesches M.D on 12/04/2014 at 1:48 PM  Between 7am to 6pm - Pager - [phone]  After 6pm go to [email or web address] - password [email or web address] [facility]  [facility]  Office  [phone]  CC: Primary care physician; Amy Annamary Rummage, NP

## 2014-12-05 ENCOUNTER — Encounter: Payer: Self-pay | Admitting: Gastroenterology

## 2014-12-05 LAB — SURGICAL PATHOLOGY

## 2014-12-05 MED ORDER — METRONIDAZOLE IN NACL 5-0.79 MG/ML-% IV SOLN
500.0000 mg | Freq: Two times a day (BID) | INTRAVENOUS | Status: DC
  Administered 2014-12-05 – 2014-12-07 (×4): 500 mg via INTRAVENOUS
  Filled 2014-12-05 (×6): qty 100

## 2014-12-05 MED ORDER — DEXTROSE-NACL 5-0.9 % IV SOLN
INTRAVENOUS | Status: DC
  Administered 2014-12-05 – 2014-12-07 (×5): via INTRAVENOUS

## 2014-12-05 MED ORDER — CLARITHROMYCIN 250 MG/5ML PO SUSR
500.0000 mg | Freq: Two times a day (BID) | ORAL | Status: DC
  Administered 2014-12-05: 500 mg via ORAL
  Filled 2014-12-05 (×3): qty 10

## 2014-12-05 NOTE — Progress Notes (Signed)
Subjective:   80 year old male with gastric outlet obstruction secondary to peptic ulcer disease. States he is feeling much better. Has been passing flatus. Desires to eat. No fevers, chills or any complaints other than from his NGT.  Vital signs in last 24 hours: Temp:  [97.4 F (36.3 C)-98.2 F (36.8 C)] 97.8 F (36.6 C) (08/17 0738) Pulse Rate:  [61-69] 61 (08/17 0738) Resp:  [14-16] 14 (08/16 2325) BP: (156-171)/(81-102) 156/81 mmHg (08/17 0738) SpO2:  [94 %-96 %] 94 % (08/17 0738) Last BM Date: 11/29/14  Intake/Output from previous day: 08/16 0701 - 08/17 0700 In: 2286.5 [I.V.:2286.5] Out: 1675 [Urine:875; Emesis/NG output:800]  Exam:  Gen: NAD Resp: CTA CV: RRR Abd: Soft, NT, ND, NGT in place with 1021ml out over past 24 hours  Lab Results:  CBC  Recent Labs  12/03/14 0530  WBC 9.9  HGB 13.6  HCT 39.1*  PLT 206   CMP     Component Value Date/Time   NA 137 12/03/2014 0530   NA 138 07/18/2013 0932   K 4.4 12/03/2014 0530   K 4.0 07/18/2013 0932   CL 104 12/03/2014 0530   CL 101 07/18/2013 0932   CO2 28 12/03/2014 0530   CO2 31 07/18/2013 0932   GLUCOSE 126* 12/03/2014 0530   GLUCOSE 121* 07/18/2013 0932   BUN 11 12/03/2014 0530   BUN 23* 07/18/2013 0932   CREATININE 1.04 12/03/2014 0530   CREATININE 1.47* 07/18/2013 0932   CALCIUM 8.2* 12/03/2014 0530   CALCIUM 9.5 07/18/2013 0932   PROT 7.7 12/02/2014 0912   PROT 7.9 07/18/2013 0932   ALBUMIN 4.0 12/02/2014 0912   ALBUMIN 3.9 07/18/2013 0932   AST 18 12/02/2014 0912   AST 13* 07/18/2013 0932   ALT 16* 12/02/2014 0912   ALT 21 07/18/2013 0932   ALKPHOS 83 12/02/2014 0912   ALKPHOS 96 07/18/2013 0932   BILITOT 0.7 12/02/2014 0912   BILITOT 0.4 07/18/2013 0932   GFRNONAA >60 12/03/2014 0530   GFRNONAA 46* 07/18/2013 0932   GFRNONAA >60 06/08/2011 0527   GFRAA >60 12/03/2014 0530   GFRAA 53* 07/18/2013 0932   GFRAA >60 06/08/2011 0527   PT/INR  Recent Labs  12/03/14 0530  LABPROT 14.6   INR 1.12    Studies/Results: No results found.  Assessment/Plan: 80 year old male with GOO. Symptoms relieved with NGT decompression. Output is too high for trial of clamping at this time. However the makeup of the output has improved.  Continue NGT Continue IVF Will check labs  Clayburn Pert, MD [facility]

## 2014-12-05 NOTE — Consult Note (Signed)
[facility] [place] NAME: [name]    MR#:  [id number]  DATE OF BIRTH:  06-May-1934  SUBJECTIVE: Seen today, patient the denies any nausea or vomiting. And he says he is very hungry. Using hydralazine hydralazine for systolic blood pressure more than 160.   CHIEF COMPLAINT:   Chief Complaint  Patient presents with  . Abdominal Pain    REVIEW OF SYSTEMS:    Review of Systems  Constitutional: Negative for fever and chills.  HENT: Negative for hearing loss.   Eyes: Negative for blurred vision, double vision and photophobia.  Respiratory: Negative for cough, hemoptysis and shortness of breath.   Cardiovascular: Negative for palpitations, orthopnea and leg swelling.  Gastrointestinal: Negative for vomiting, abdominal pain and diarrhea.  Genitourinary: Negative for dysuria and urgency.  Musculoskeletal: Negative for myalgias and neck pain.  Skin: Negative for rash.  Neurological: Negative for dizziness, focal weakness, seizures, weakness and headaches.  Psychiatric/Behavioral: Negative for memory loss. The patient does not have insomnia.     Nutrition: ng tuibe Tolerating Diet: Tolerating PT:      DRUG ALLERGIES:  No Known Allergies  VITALS:  Blood pressure 156/81, pulse 61, temperature 97.8 F (36.6 C), temperature source Oral, resp. rate 14, height 5\' 11"  (1.803 m), weight 91.627 kg (202 lb), SpO2 94 %.  PHYSICAL EXAMINATION:   Physical Exam  GENERAL:  80 y.o.-year-old patient lying in the bed with no acute distress.  EYES: Pupils equal, round, reactive to light and accommodation. No scleral icterus. Extraocular muscles intact.  HEENT: Head atraumatic, normocephalic. Oropharynx and nasopharynx clear. NG tube is present NECK:  Supple, no jugular venous distention. No thyroid enlargement, no tenderness.  LUNGS: Normal breath sounds bilaterally, no wheezing, rales,rhonchi or crepitation. No use of accessory muscles of  respiration.  CARDIOVASCULAR: S1, S2 normal. No murmurs, rubs, or gallops.  ABDOMEN: Soft, nontender, nondistended. Bowel sounds present. No organomegaly or mass.  EXTREMITIES: No pedal edema, cyanosis, or clubbing.  NEUROLOGIC: Cranial nerves II through XII are intact. Muscle strength 5/5 in all extremities. Sensation intact. Gait not checked.  PSYCHIATRIC: The patient is alert and oriented x 3.  SKIN: No obvious rash, lesion, or ulcer.    LABORATORY PANEL:   CBC  Recent Labs Lab 12/03/14 0530  WBC 9.9  HGB 13.6  HCT 39.1*  PLT 206   ------------------------------------------------------------------------------------------------------------------  Chemistries   Recent Labs Lab 12/02/14 0912 12/03/14 0530  NA 139 137  K 4.0 4.4  CL 99* 104  CO2 29 28  GLUCOSE 130* 126*  BUN 15 11  CREATININE 1.16 1.04  CALCIUM 9.1 8.2*  AST 18  --   ALT 16*  --   ALKPHOS 83  --   BILITOT 0.7  --    ------------------------------------------------------------------------------------------------------------------  Cardiac Enzymes No results for input(s): TROPONINI in the last 168 hours. ------------------------------------------------------------------------------------------------------------------  RADIOLOGY:  No results found.   ASSESSMENT AND PLAN:   Active Problems:   Gastric outlet obstruction   Protein-calorie malnutrition, severe   #1 gastric outlet obstruction secondary to gastric ulcers. Continue IV PPIs, NG tube to LIS.Marland Kitchen Patient to has no nausea. GI and surgery are following. The following to see if NG tube: can be clamped  and start and on diet .continue IV fluids discontinue potassium from IV fluids as potassium is 4.4. #2 hypertension controlled continue IV hydralazine when necessary. If patient is started on diet , then follow the BP trend and start up by mouth  hydralazine.     All the records are reviewed and case discussed with Care Management/Social  Workerr. Management plans discussed with the patient, family and they are in agreement.  CODE STATUS: full  TOTAL TIME TAKING CARE OF THIS PATIENT: 35 minutes.   POSSIBLE D/C IN 1-2 DAYS, DEPENDING ON CLINICAL CONDITION.   [name] M.D on 12/05/2014 at 1:09 PM  Between 7am to 6pm - Pager - [phone]  After 6pm go to [email or web address] - password [email or web address] [facility]  [place] [facility]  Office  [phone]  CC: Primary care physician; Jonathan Annamary Rummage, NP

## 2014-12-05 NOTE — Consult Note (Signed)
  GI Inpatient Follow-up Note  Patient Identification: [name] is a 80 y.o. male with gastric outlet obstruction.  Subjective: Feels well with NG suction. Passing flatus. No BM. Still with significant NG output. Scheduled Inpatient Medications:  . enoxaparin (LOVENOX) injection  40 mg Subcutaneous Q24H  . pantoprazole (PROTONIX) IV  40 mg Intravenous Q12H    Continuous Inpatient Infusions:   . dextrose 5 % and 0.45 % NaCl with KCl 20 mEq/L 100 mL/hr at 12/05/14 0501    PRN Inpatient Medications:  hydrALAZINE, morphine injection, ondansetron **OR** ondansetron (ZOFRAN) IV  Review of Systems: Constitutional: Weight is stable.  Eyes: No changes in vision. ENT: No oral lesions, sore throat.  GI: see HPI.  Heme/Lymph: No easy bruising.  CV: No chest pain.  GU: No hematuria.  Integumentary: No rashes.  Neuro: No headaches.  Psych: No depression/anxiety.  Endocrine: No heat/cold intolerance.  Allergic/Immunologic: No urticaria.  Resp: No cough, SOB.  Musculoskeletal: No joint swelling.    Physical Examination: BP 156/81 mmHg  Pulse 61  Temp(Src) 97.8 F (36.6 C) (Oral)  Resp 14  Ht 5\' 11"  (1.803 m)  Wt 91.627 kg (202 lb)  BMI 28.19 kg/m2  SpO2 94% Gen: NAD, alert and oriented x 4 HEENT: PEERLA, EOMI, Neck: supple, no JVD or thyromegaly Chest: CTA bilaterally, no wheezes, crackles, or other adventitious sounds CV: RRR, no m/g/c/r Abd: soft, NT, ND, +BS in all four quadrants; no HSM, guarding, ridigity, or rebound tenderness Ext: no edema, well perfused with 2+ pulses, Skin: no rash or lesions noted Lymph: no LAD  Data: Lab Results  Component Value Date   WBC 9.9 12/03/2014   HGB 13.6 12/03/2014   HCT 39.1* 12/03/2014   MCV 95.0 12/03/2014   PLT 206 12/03/2014    Recent Labs Lab 12/02/14 0912 12/03/14 0530  HGB 15.1 13.6   Lab Results  Component Value Date   NA 137 12/03/2014   K 4.4 12/03/2014   CL 104 12/03/2014   CO2 28 12/03/2014   BUN 11  12/03/2014   CREATININE 1.04 12/03/2014   Lab Results  Component Value Date   ALT 16* 12/02/2014   AST 18 12/02/2014   ALKPHOS 83 12/02/2014   BILITOT 0.7 12/02/2014    Recent Labs Lab 12/03/14 0530  INR 1.12   Assessment/Plan: [name] is a 80 y.o. male with GOO from gastric ulcers.   Recommendations: Continue PPI BID. Follow surgery's recommendations as to when to clamp NG and start clears. Nothing more to add from GI. Will sign off. Thanks. Please call with questions or concerns.  Matteus Mcnelly, Lupita Dawn, MD

## 2014-12-06 LAB — CBC
HEMATOCRIT: 45 % (ref 40.0–52.0)
HEMOGLOBIN: 15.4 g/dL (ref 13.0–18.0)
MCH: 32.3 pg (ref 26.0–34.0)
MCHC: 34.3 g/dL (ref 32.0–36.0)
MCV: 94.3 fL (ref 80.0–100.0)
PLATELETS: 239 10*3/uL (ref 150–440)
RBC: 4.77 MIL/uL (ref 4.40–5.90)
RDW: 13.1 % (ref 11.5–14.5)
WBC: 9.4 10*3/uL (ref 3.8–10.6)

## 2014-12-06 LAB — BASIC METABOLIC PANEL
ANION GAP: 9 (ref 5–15)
BUN: 18 mg/dL (ref 6–20)
CHLORIDE: 103 mmol/L (ref 101–111)
CO2: 27 mmol/L (ref 22–32)
Calcium: 8.9 mg/dL (ref 8.9–10.3)
Creatinine, Ser: 1.21 mg/dL (ref 0.61–1.24)
GFR calc Af Amer: >60 mL/min (ref >60)
GFR, EST NON AFRICAN AMERICAN: 56 mL/min — AB (ref >60)
GLUCOSE: 119 mg/dL — AB (ref 65–99)
POTASSIUM: 3.9 mmol/L (ref 3.5–5.1)
Sodium: 139 mmol/L (ref 135–145)

## 2014-12-06 LAB — PHOSPHORUS: Phosphorus: 3.7 mg/dL (ref 2.5–4.6)

## 2014-12-06 LAB — MAGNESIUM: Magnesium: 2.1 mg/dL (ref 1.7–2.4)

## 2014-12-06 MED ORDER — CLARITHROMYCIN 250 MG/5ML PO SUSR
500.0000 mg | Freq: Two times a day (BID) | ORAL | Status: DC
  Administered 2014-12-06 – 2014-12-07 (×3): 500 mg via ORAL
  Filled 2014-12-06 (×4): qty 10

## 2014-12-06 NOTE — Progress Notes (Signed)
CC: Gastric Outlet Obstruction Subjective: 80 year old male with GOO secondary to peptic ulcer disease. Totally asymptomatic with NG tube to suction. Did not like the taste of the clarithromycin that was started yesterday. Otherwise, denies any pain or any other complaint. Wants to go for a walk.  Objective: Vital signs in last 24 hours: Temp:  [98.1 F (36.7 C)-98.6 F (37 C)] 98.1 F (36.7 C) (08/18 0741) Pulse Rate:  [72-86] 78 (08/18 0741) Resp:  [16-18] 18 (08/18 0741) BP: (120-151)/(73-104) 139/82 mmHg (08/18 0741) SpO2:  [92 %-97 %] 92 % (08/18 0741) Last BM Date: 11/29/14  Intake/Output from previous day: 08/17 0701 - 08/18 0700 In: 1772.5 [P.O.:80; I.V.:1692.5] Out: 1950 [Urine:300; Emesis/NG output:1650] Intake/Output this shift:    Physical exam:  Gen: NAD Head: NGT in place with gastric tube draining gastric fluid Chest: CTA and RRR ABD: Soft, NT and ND.  Lab Results: CBC   Recent Labs  12/06/14 0726  WBC 9.4  HGB 15.4  HCT 45.0  PLT 239   BMET  Recent Labs  12/06/14 0726  NA 139  K 3.9  CL 103  CO2 27  GLUCOSE 119*  BUN 18  CREATININE 1.21  CALCIUM 8.9   PT/INR No results for input(s): LABPROT, INR in the last 72 hours. ABG No results for input(s): PHART, HCO3 in the last 72 hours.  Invalid input(s): PCO2, PO2  Studies/Results: No results found.  Anti-infectives: Anti-infectives    Start     Dose/Rate Route Frequency Ordered Stop   12/06/14 1000  clarithromycin (BIAXIN) 250 MG/5ML suspension 500 mg    Comments:  OK to give through NGT   500 mg Oral Every 12 hours 12/06/14 0837     12/05/14 2200  clarithromycin (BIAXIN) 250 MG/5ML suspension 500 mg  Status:  Discontinued     500 mg Oral Every 12 hours 12/05/14 1809 12/06/14 0837   12/05/14 1815  metroNIDAZOLE (FLAGYL) IVPB 500 mg     500 mg 100 mL/hr over 60 Minutes Intravenous Every 12 hours 12/05/14 1809        Assessment/Plan:  80 year old male with biopsy proven H.  Pylori associated peptic ulcer disease causing gastric outlet obstruction. Triple Therapy started on 8/17. Patient continues to have near complete gastric obstruction with 1652ml out of his NGT over the past 24 hours.  If the obstruction does not start to show signs of opening up soon, will consider surgical bypass Continue IVF, encourage ambulation  Mahamud Metts T. Adonis Huguenin, MD, FACS  12/06/2014

## 2014-12-06 NOTE — Care Management Important Message (Signed)
Important Message  Patient Details  Name: [name] MRN: [id number] Date of Birth: [birth year]   Medicare Important Message Given:  Yes-third notification given    Juliann Pulse A Allmond 12/06/2014, 10:49 AM

## 2014-12-06 NOTE — Consult Note (Signed)
[facility] [facility] [place] NAME: [name]    MR#:  [id number]  DATE OF BIRTH:  04/10/1935  SUBJECTIVE: 1600 mL output from NG tube since 24 hours. Surgery recommends continuing nothing by mouth, NG tube  Suction., IV fluids.   CHIEF COMPLAINT:   Chief Complaint  Patient presents with  . Abdominal Pain    REVIEW OF SYSTEMS:    Review of Systems  Constitutional: Negative for fever and chills.  HENT: Negative for hearing loss.   Eyes: Negative for blurred vision, double vision and photophobia.  Respiratory: Negative for cough, hemoptysis and shortness of breath.   Cardiovascular: Negative for palpitations, orthopnea and leg swelling.  Gastrointestinal: Negative for vomiting, abdominal pain and diarrhea.  Genitourinary: Negative for dysuria and urgency.  Musculoskeletal: Negative for myalgias and neck pain.  Skin: Negative for rash.  Neurological: Negative for dizziness, focal weakness, seizures, weakness and headaches.  Psychiatric/Behavioral: Negative for memory loss. The patient does not have insomnia.     Nutrition: ng tuibe Tolerating Diet: Tolerating PT:      DRUG ALLERGIES:  No Known Allergies  VITALS:  Blood pressure 139/82, pulse 78, temperature 98.1 F (36.7 C), temperature source Oral, resp. rate 18, height 5\' 11"  (1.803 m), weight 91.627 kg (202 lb), SpO2 92 %.  PHYSICAL EXAMINATION:   Physical Exam  GENERAL:  80 y.o.-year-old patient lying in the bed with no acute distress.  EYES: Pupils equal, round, reactive to light and accommodation. No scleral icterus. Extraocular muscles intact.  HEENT: Head atraumatic, normocephalic. Oropharynx and nasopharynx clear. NG tube is present NECK:  Supple, no jugular venous distention. No thyroid enlargement, no tenderness.  LUNGS: Normal breath sounds bilaterally, no wheezing, rales,rhonchi or crepitation. No use of accessory muscles of respiration.  CARDIOVASCULAR: S1, S2  normal. No murmurs, rubs, or gallops.  ABDOMEN: Soft, nontender, nondistended. Bowel sounds present. No organomegaly or mass.  EXTREMITIES: No pedal edema, cyanosis, or clubbing.  NEUROLOGIC: Cranial nerves II through XII are intact. Muscle strength 5/5 in all extremities. Sensation intact. Gait not checked.  PSYCHIATRIC: The patient is alert and oriented x 3.  SKIN: No obvious rash, lesion, or ulcer.    LABORATORY PANEL:   CBC  Recent Labs Lab 12/06/14 0726  WBC 9.4  HGB 15.4  HCT 45.0  PLT 239   ------------------------------------------------------------------------------------------------------------------  Chemistries   Recent Labs Lab 12/02/14 0912  12/06/14 0726  NA 139  < > 139  K 4.0  < > 3.9  CL 99*  < > 103  CO2 29  < > 27  GLUCOSE 130*  < > 119*  BUN 15  < > 18  CREATININE 1.16  < > 1.21  CALCIUM 9.1  < > 8.9  MG  --   --  2.1  AST 18  --   --   ALT 16*  --   --   ALKPHOS 83  --   --   BILITOT 0.7  --   --   < > = values in this interval not displayed. ------------------------------------------------------------------------------------------------------------------  Cardiac Enzymes No results for input(s): TROPONINI in the last 168 hours. ------------------------------------------------------------------------------------------------------------------  RADIOLOGY:  No results found.   ASSESSMENT AND PLAN:   Active Problems:   Gastric outlet obstruction   Protein-calorie malnutrition, severe   #1 gastric outlet obstruction secondary to gastric ulcers. Continue IV PPIs, NG tube. Patient to has no nausea. GI and surgery are following. He has significant  output  NG tube so  Surgery is planning to watch him and possibly scheduled for surgery Has H pylori  Peptic ulcer;continue PPI and added clarithromycin, . #2 hypertension controlled continue IV hydralazine when necessary. If patient is started on diet , then follow the BP trend and start up by  mouth hydralazine.     All the records are reviewed and case discussed with Care Management/Social Workerr. Management plans discussed with the patient, family and they are in agreement.  CODE STATUS: full  TOTAL TIME TAKING CARE OF THIS PATIENT: 35 minutes.   POSSIBLE D/C IN 1-2 DAYS, DEPENDING ON CLINICAL CONDITION.   Epifanio Lesches M.D on 12/06/2014 at 12:26 PM  Between 7am to 6pm - Pager - [phone]  After 6pm go to [email or web address] - password [email or web address] [facility]  [place] [facility]  Office  [phone]  CC: Primary care physician; Amy Annamary Rummage, NP

## 2014-12-07 MED ORDER — CLARITHROMYCIN 500 MG PO TABS
500.0000 mg | ORAL_TABLET | Freq: Two times a day (BID) | ORAL | Status: DC
  Administered 2014-12-07 – 2014-12-08 (×2): 500 mg via ORAL
  Filled 2014-12-07 (×2): qty 1

## 2014-12-07 MED ORDER — METRONIDAZOLE 500 MG PO TABS
500.0000 mg | ORAL_TABLET | Freq: Two times a day (BID) | ORAL | Status: DC
  Administered 2014-12-07 – 2014-12-08 (×2): 500 mg via ORAL
  Filled 2014-12-07 (×2): qty 1

## 2014-12-07 NOTE — Progress Notes (Signed)
CC: Gastric Outlet Obstruction Subjective: 80 y/o M with GOO. Continues to have no complaints and remains totally asymptomatic with NGT in place. Has been drinking water and eating ice chips over last 24 hours.  Objective: Vital signs in last 24 hours: Temp:  [97.8 F (36.6 C)-98 F (36.7 C)] 98 F (36.7 C) (08/19 0753) Pulse Rate:  [72-78] 72 (08/19 0753) Resp:  [18-20] 18 (08/19 0753) BP: (126-148)/(63-85) 126/85 mmHg (08/19 0753) SpO2:  [94 %-97 %] 94 % (08/19 0753) Last BM Date: 11/29/14  Intake/Output from previous day: 08/18 0701 - 08/19 0700 In: 1318.3 [P.O.:60; I.V.:1058.3; IV Piggyback:200] Out: 900 [Emesis/NG output:900] Intake/Output this shift:    Physical exam:  GEN: NAD HEAD: NGT in place with clear output CHEST: CTA AND RRR ABD: Soft, NT, ND  Lab Results: CBC   Recent Labs  12/06/14 0726  WBC 9.4  HGB 15.4  HCT 45.0  PLT 239   BMET  Recent Labs  12/06/14 0726  NA 139  K 3.9  CL 103  CO2 27  GLUCOSE 119*  BUN 18  CREATININE 1.21  CALCIUM 8.9   PT/INR No results for input(s): LABPROT, INR in the last 72 hours. ABG No results for input(s): PHART, HCO3 in the last 72 hours.  Invalid input(s): PCO2, PO2  Studies/Results: No results found.  Anti-infectives: Anti-infectives    Start     Dose/Rate Route Frequency Ordered Stop   12/06/14 1000  clarithromycin (BIAXIN) 250 MG/5ML suspension 500 mg    Comments:  OK to give through NGT   500 mg Oral Every 12 hours 12/06/14 0837     12/05/14 2200  clarithromycin (BIAXIN) 250 MG/5ML suspension 500 mg  Status:  Discontinued     500 mg Oral Every 12 hours 12/05/14 1809 12/06/14 0837   12/05/14 1815  metroNIDAZOLE (FLAGYL) IVPB 500 mg     500 mg 100 mL/hr over 60 Minutes Intravenous Every 12 hours 12/05/14 1809        Assessment/Plan:  80 year old male with gastric outlet obstruction secondary to peptic ulcer disease. NG output significantly less over last 24 hours. 900 mls recorded output  while patient has been taking at least 300-400 ml of ice and oral ABX. Will give trial of clamping NGT this morning. Will place back on suction at noon. If output is low will then pull NGT. Advancement of diet pending NGT removal, will also transition to all oral ABX at that time   Lalitha Ilyas T. Adonis Huguenin, MD, FACS  12/07/2014

## 2014-12-07 NOTE — Progress Notes (Signed)
Nutrition Follow-up  DOCUMENTATION CODES:   Severe malnutrition in context of acute illness/injury  INTERVENTION:   Coordination of care: Await diet progression Nutrition Supplement Therapy: Recommend boost breeze/ensure for added nutrition once on po diet  NUTRITION DIAGNOSIS:   Inadequate oral intake related to altered GI function as evidenced by NPO status.   GOAL:   Patient will meet greater than or equal to 90% of their needs    MONITOR:    (Energy intake, digestive system, Electrolyte and renal profile)  REASON FOR ASSESSMENT:   Malnutrition Screening Tool    ASSESSMENT:     Pt with GOO secondary to PUD.  NG tube in place. Planning clamping NG tube this am    Current Nutrition: NPO   Gastrointestinal Profile: NG tube with 928ml output with pt taking water and ice Last BM: 8/11   Medications: D5 NS at 16ml/hr, protonix  Electrolyte/Renal Profile and Glucose Profile:   Recent Labs Lab 12/02/14 0912 12/03/14 0530 12/06/14 0726  NA 139 137 139  K 4.0 4.4 3.9  CL 99* 104 103  CO2 29 28 27   BUN 15 11 18   CREATININE 1.16 1.04 1.21  CALCIUM 9.1 8.2* 8.9  MG  --   --  2.1  PHOS  --   --  3.7  GLUCOSE 130* 126* 119*   Protein Profile:  Recent Labs Lab 12/02/14 0912  ALBUMIN 4.0     Weight Trend since Admission: [facility]   12/02/14 0905  Weight: 202 lb (91.627 kg)      Diet Order:  Diet NPO time specified  Skin:   reviewed   Height:   Ht Readings from Last 1 Encounters:  12/02/14 5\' 11"  (1.803 m)    Weight:   Wt Readings from Last 1 Encounters:  12/02/14 202 lb (91.627 kg)     BMI:  Body mass index is 28.19 kg/(m^2).  Estimated Nutritional Needs:   Kcal:  BEE 1667 kcals (IF 1.0-1.2, AF 1.3) 2167-2650ml/d  Protein:  (1.0-1.2 g/kg) 92-110 g/d  Fluid:  (30-73ml/kg) 2760-329ml/d  EDUCATION NEEDS:   No education needs identified at this time  HIGH Care Level  [name], [place], [place] (pager)

## 2014-12-07 NOTE — Care Management (Signed)
Spoke with patient and spouse. Pleasant and oriented. From home and independent drives self to appointments. No needs anticipated at discharge.

## 2014-12-08 MED ORDER — PANTOPRAZOLE SODIUM 40 MG PO TBEC: 40.0000 mg | DELAYED_RELEASE_TABLET | Freq: Two times a day (BID) | ORAL | Status: DC

## 2014-12-08 MED ORDER — CLARITHROMYCIN 500 MG PO TABS: 500.0000 mg | ORAL_TABLET | Freq: Two times a day (BID) | ORAL | Status: DC

## 2014-12-08 MED ORDER — METRONIDAZOLE 500 MG PO TABS: 500.0000 mg | ORAL_TABLET | Freq: Two times a day (BID) | ORAL | Status: DC

## 2014-12-08 NOTE — Discharge Summary (Signed)
Patient ID: KIREE DEJARNETTE MRN: [id number] DOB/AGE: 1934-10-15 80 y.o.  Admit date: 12/02/2014 Discharge date: 12/08/2014  Discharge Diagnoses:  Peptic ulcer disease  Procedures Performed: EGD  Discharged Condition: good  Hospital Course: 80 year old male admitted for nausea and vomiting, found to have gastric outlet obstruction secondary to peptic ulcer disease. Was treated with NG tube decompression, EGD, and triple therapy for H. Pylori. Had NG tube decrease to minimal followed by toleration of diet prior to discharge.  Discharge Orders: Resume home medications, do not stop anti-acid or antibiotics until directed so. May resume normal activities   Disposition: 01-Home or Self Care  Discharge Medications:  Current facility-administered medications:  .  clarithromycin (BIAXIN) tablet 500 mg, 500 mg, Oral, Q12H, Clayburn Pert, MD, 500 mg at 12/08/14 1003 .  enoxaparin (LOVENOX) injection 40 mg, 40 mg, Subcutaneous, Q24H, [name], MD, 40 mg at 12/07/14 1446 .  hydrALAZINE (APRESOLINE) injection 10 mg, 10 mg, Intravenous, Q6H PRN, Epifanio Lesches, MD .  metroNIDAZOLE (FLAGYL) tablet 500 mg, 500 mg, Oral, Q12H, Clayburn Pert, MD, 500 mg at 12/08/14 1001 .  morphine 4 MG/ML injection 4 mg, 4 mg, Intravenous, Q2H PRN, [name], MD, 4 mg at 12/02/14 1857 .  ondansetron (ZOFRAN-ODT) disintegrating tablet 4 mg, 4 mg, Oral, Q6H PRN **OR** ondansetron (ZOFRAN) injection 4 mg, 4 mg, Intravenous, Q6H PRN, [name], MD, 4 mg at 12/02/14 1611 .  pantoprazole (PROTONIX) injection 40 mg, 40 mg, Intravenous, Q12H, Hulen Luster, MD, 40 mg at 12/08/14 1001  Follwup: Follow-up Information    Follow up with [facility]. Schedule an appointment as soon as possible for a visit in 1 week.   Why:  For status check and to arrange repeat EGD   Contact information:   [address] [place] [address] [phone]      Signed: Clayburn Pert 12/08/2014, 2:37 PM

## 2014-12-08 NOTE — Consult Note (Signed)
[place] [facility] [place] NAME: [name]    MR#:  [id number]  DATE OF BIRTH:  08-Jul-1934  SUBJECTIVE: tolerating diet without nausea.no abdominal  Pain.  CHIEF COMPLAINT:   Chief Complaint  Patient presents with  . Abdominal Pain    REVIEW OF SYSTEMS:    Review of Systems  Constitutional: Negative for fever and chills.  HENT: Negative for hearing loss.   Eyes: Negative for blurred vision, double vision and photophobia.  Respiratory: Negative for cough, hemoptysis and shortness of breath.   Cardiovascular: Negative for palpitations, orthopnea and leg swelling.  Gastrointestinal: Negative for vomiting, abdominal pain and diarrhea.  Genitourinary: Negative for dysuria and urgency.  Musculoskeletal: Negative for myalgias and neck pain.  Skin: Negative for rash.  Neurological: Negative for dizziness, focal weakness, seizures, weakness and headaches.  Psychiatric/Behavioral: Negative for memory loss. The patient does not have insomnia.     Nutrition: ng tuibe Tolerating Diet: Tolerating PT:      DRUG ALLERGIES:  No Known Allergies  VITALS:  Blood pressure 133/75, pulse 64, temperature 98 F (36.7 C), temperature source Oral, resp. rate 17, height 5\' 11"  (1.803 m), weight 91.627 kg (202 lb), SpO2 95 %.  PHYSICAL EXAMINATION:   Physical Exam  GENERAL:  80 y.o.-year-old patient lying in the bed with no acute distress.  EYES: Pupils equal, round, reactive to light and accommodation. No scleral icterus. Extraocular muscles intact.  HEENT: Head atraumatic, normocephalic. Oropharynx and nasopharynx clear. NG tube is present NECK:  Supple, no jugular venous distention. No thyroid enlargement, no tenderness.  LUNGS: Normal breath sounds bilaterally, no wheezing, rales,rhonchi or crepitation. No use of accessory muscles of respiration.  CARDIOVASCULAR: S1, S2 normal. No murmurs, rubs, or gallops.  ABDOMEN: Soft, nontender,  nondistended. Bowel sounds present. No organomegaly or mass.  EXTREMITIES: No pedal edema, cyanosis, or clubbing.  NEUROLOGIC: Cranial nerves II through XII are intact. Muscle strength 5/5 in all extremities. Sensation intact. Gait not checked.  PSYCHIATRIC: The patient is alert and oriented x 3.  SKIN: No obvious rash, lesion, or ulcer.    LABORATORY PANEL:   CBC  Recent Labs Lab 12/06/14 0726  WBC 9.4  HGB 15.4  HCT 45.0  PLT 239   ------------------------------------------------------------------------------------------------------------------  Chemistries   Recent Labs Lab 12/02/14 0912  12/06/14 0726  NA 139  < > 139  K 4.0  < > 3.9  CL 99*  < > 103  CO2 29  < > 27  GLUCOSE 130*  < > 119*  BUN 15  < > 18  CREATININE 1.16  < > 1.21  CALCIUM 9.1  < > 8.9  MG  --   --  2.1  AST 18  --   --   ALT 16*  --   --   ALKPHOS 83  --   --   BILITOT 0.7  --   --   < > = values in this interval not displayed. ------------------------------------------------------------------------------------------------------------------  Cardiac Enzymes No results for input(s): TROPONINI in the last 168 hours. ------------------------------------------------------------------------------------------------------------------  RADIOLOGY:  No results found.   ASSESSMENT AND PLAN:   Active Problems:   Gastric outlet obstruction   Protein-calorie malnutrition, severe   #1 gastric outlet obstruction secondary to gastric ulcers. Continue IV PPIs,  Has H pylori  Peptic ulcer;continue PPI and added clarithromycin, . #2 hypertension controlled continue IV hydralazine when necessary. Looks like he did not require any in last 24-48hrs.so can go home  home without any bp meds,bp is normal range for his age. Will sign off,recall if needed.    All the records are reviewed and case discussed with Care Management/Social Workerr. Management plans discussed with the patient, family and they are  in agreement.  CODE STATUS: full  TOTAL TIME TAKING CARE OF THIS PATIENT: 35 minutes.   POSSIBLE D/C IN 1-2 DAYS, DEPENDING ON CLINICAL CONDITION.   Epifanio Lesches M.D on 12/08/2014 at 9:17 AM  Between 7am to 6pm - Pager - [phone]  After 6pm go to [email or web address] - password [email or web address]  [facility]  Office  [phone]  CC: Primary care physician; Amy Annamary Rummage, NP

## 2014-12-08 NOTE — Progress Notes (Signed)
Discharge   Pt was given discharge instructions with family at he bedside. Pt was also given prescriptions and instructed that any pharmacy could fill. He was also instructed to take his pm dose. PIV was removed and pt discharged in a wheelchair. No complaints of pain at this time.

## 2014-12-08 NOTE — Discharge Instructions (Signed)

## 2014-12-08 NOTE — Progress Notes (Signed)
CC: Peptic ulcer disease Subjective: 80 year old male admitted for gastric outlet obstruction secondary to peptic ulcer disease. Patient tolerated the clamping and then removal of his NG tube yesterday. He has continued on a clear liquid diet without any returning of nausea or vomiting. States that he is hungry. He has had return of normal bowel function during hospital stay. Denies any fevers chills nausea vomiting or any other symptoms at this time.  Objective: Vital signs in last 24 hours: Temp:  [97.9 F (36.6 C)-98.1 F (36.7 C)] 98 F (36.7 C) (08/20 0729) Pulse Rate:  [64-71] 64 (08/20 0731) Resp:  [16-17] 17 (08/20 0729) BP: (122-137)/(75-97) 133/75 mmHg (08/20 0731) SpO2:  [94 %-97 %] 95 % (08/20 0729) Last BM Date: 12/01/14  Intake/Output from previous day: 08/19 0701 - 08/20 0700 In: 833 [I.V.:833] Out: 400 [Urine:400] Intake/Output this shift: Total I/O In: 131 [I.V.:131] Out: -   Physical exam:  General no acute distress Chest clear to auscultation regular rate and rhythm Abdomen soft nontender nondistended with normal active bowel sounds Skin irritation around his mouth has a reaction from shaving cream, appears to also be some irritation from the bed to his upper back.  Lab Results: CBC   Recent Labs  12/06/14 0726  WBC 9.4  HGB 15.4  HCT 45.0  PLT 239   BMET  Recent Labs  12/06/14 0726  NA 139  K 3.9  CL 103  CO2 27  GLUCOSE 119*  BUN 18  CREATININE 1.21  CALCIUM 8.9   PT/INR No results for input(s): LABPROT, INR in the last 72 hours. ABG No results for input(s): PHART, HCO3 in the last 72 hours.  Invalid input(s): PCO2, PO2  Studies/Results: No results found.  Anti-infectives: Anti-infectives    Start     Dose/Rate Route Frequency Ordered Stop   12/07/14 2200  clarithromycin (BIAXIN) tablet 500 mg     500 mg Oral Every 12 hours 12/07/14 1318     12/07/14 2100  metroNIDAZOLE (FLAGYL) tablet 500 mg     500 mg Oral Every 12 hours  12/07/14 1318     12/06/14 1000  clarithromycin (BIAXIN) 250 MG/5ML suspension 500 mg  Status:  Discontinued    Comments:  OK to give through NGT   500 mg Oral Every 12 hours 12/06/14 0837 12/07/14 1318   12/05/14 2200  clarithromycin (BIAXIN) 250 MG/5ML suspension 500 mg  Status:  Discontinued     500 mg Oral Every 12 hours 12/05/14 1809 12/06/14 0837   12/05/14 1815  metroNIDAZOLE (FLAGYL) IVPB 500 mg  Status:  Discontinued     500 mg 100 mL/hr over 60 Minutes Intravenous Every 12 hours 12/05/14 1809 12/07/14 1318      Assessment/Plan:  80 year old male with peptic ulcer disease. Gastric outlet obstruction appears to have resolved with medical management. We'll give regular diet this morning. If tolerates regular diet we'll then be able to discharge home on oral triple therapy for treatment of peptic ulcer disease with H. pylori.  Halford Goetzke T. Adonis Huguenin, MD, FACS  12/08/2014

## 2014-12-10 ENCOUNTER — Ambulatory Visit: Payer: Medicare Other | Admitting: Surgery

## 2014-12-18 ENCOUNTER — Ambulatory Visit (INDEPENDENT_AMBULATORY_CARE_PROVIDER_SITE_OTHER): Payer: Medicare Other | Admitting: Surgery

## 2014-12-18 ENCOUNTER — Encounter: Payer: Self-pay | Admitting: Surgery

## 2014-12-18 VITALS — BP 135/75 | HR 65 | Temp 98.0°F | Ht 71.0 in | Wt 205.0 lb

## 2014-12-18 DIAGNOSIS — K311 Adult hypertrophic pyloric stenosis: Secondary | ICD-10-CM

## 2014-12-18 NOTE — Patient Instructions (Signed)
Patient to follow up as needed

## 2014-12-18 NOTE — Progress Notes (Signed)
Outpatient Surgical Follow Up  12/18/2014  [name] is an 80 y.o. male.   Chief Complaint  Patient presents with  . Follow-up    gastric outlet obstruction    HPI: He was hospitalized with profound nausea and vomiting. Workup in the hospital revealed a gastric outlet obstruction. He did not have any evidence of small bowel obstruction at that time. After nasogastric decompression and underwent an EGD which did demonstrate a very tight pylorus likely secondary to peptic ulcer disease. The area was dilated.. Since discharge she has done reasonably well. He is eating a light foods and avoiding large quantities but has noted no significant nausea or vomiting. He is very happy to have his bowels moving on a regular basis. He is taking his peptic ulcer disease regimen at the present time.  Past Medical History  Diagnosis Date  . Bowel obstruction   . Hyperchloremia   . High cholesterol   . Gastric outlet obstruction   . BPH (benign prostatic hypertrophy)   . Pancreatitis   . Hypertension     Past Surgical History  Procedure Laterality Date  . Esophagogastroduodenoscopy N/A 12/03/2014    Procedure: ESOPHAGOGASTRODUODENOSCOPY (EGD);  Surgeon: Hulen Luster, MD;  Location: [facility] ENDOSCOPY;  Service: Endoscopy;  Laterality: N/A;    Family History  Problem Relation Age of Onset  . Heart disease Mother   . Heart disease Father   . Heart disease Brother     Social History:  reports that he has quit smoking. He has never used smokeless tobacco. He reports that he does not drink alcohol or use illicit drugs.  Allergies: No Known Allergies  Medications reviewed.    ROS    BP 135/75 mmHg  Pulse 65  Temp(Src) 98 F (36.7 C) (Oral)  Ht 5\' 11"  (1.803 m)  Wt 205 lb (92.987 kg)  BMI 28.60 kg/m2  Physical Exam's abdomen is soft nontender with no abdominal distention and I can detect. He does have active bowel sounds with no rebound or guarding.  His lungs are clear his cardiac exam  reveals no murmurs or gallops.     No results found for this or any previous visit (from the past 48 hour(s)). No results found.  Assessment/Plan:  1. Gastric outlet obstruction He is feeling better overall. He has a follow-up set up with gastroenterology for reevaluation of possible second scoping procedure. At the present time I do not see any indication for surgical intervention and at his age, now nearly 80 years old, I would not recommend surgery unless his symptoms did not improve he is in agreement with this plan.     [name]  12/18/2014,negative

## 2014-12-26 ENCOUNTER — Telehealth: Payer: Self-pay

## 2014-12-26 ENCOUNTER — Other Ambulatory Visit: Payer: Self-pay | Admitting: General Surgery

## 2014-12-26 NOTE — Telephone Encounter (Signed)
Received refill request for Antibiotics. While reviewing chart, saw message from [place] that patient was supposed to take Pylera but states he is following up elsewhere.  Called patient and explained that we do not need to refill antibiotics but he will need to follow-up with his GI physician (Dr. Candace Cruise) in regards to his H. Pylori infection. Number given to patient for [facility] at this time.   Patient will call back with any further questions.

## 2015-02-11 DIAGNOSIS — A048 Other specified bacterial intestinal infections: Secondary | ICD-10-CM | POA: Diagnosis not present

## 2015-02-19 DIAGNOSIS — Z23 Encounter for immunization: Secondary | ICD-10-CM | POA: Diagnosis not present

## 2015-04-24 ENCOUNTER — Other Ambulatory Visit: Payer: Self-pay | Admitting: Family Medicine

## 2015-12-13 ENCOUNTER — Other Ambulatory Visit: Payer: Self-pay

## 2016-01-21 DIAGNOSIS — Z23 Encounter for immunization: Secondary | ICD-10-CM | POA: Diagnosis not present

## 2016-02-09 ENCOUNTER — Other Ambulatory Visit: Payer: Self-pay | Admitting: Family Medicine

## 2016-04-10 ENCOUNTER — Ambulatory Visit (INDEPENDENT_AMBULATORY_CARE_PROVIDER_SITE_OTHER): Payer: Medicare Other | Admitting: Family Medicine

## 2016-04-10 ENCOUNTER — Encounter: Payer: Self-pay | Admitting: Family Medicine

## 2016-04-10 VITALS — BP 138/76 | HR 71 | Temp 97.8°F | Resp 16 | Ht 71.0 in | Wt 229.6 lb

## 2016-04-10 DIAGNOSIS — N138 Other obstructive and reflux uropathy: Secondary | ICD-10-CM | POA: Diagnosis not present

## 2016-04-10 DIAGNOSIS — E782 Mixed hyperlipidemia: Secondary | ICD-10-CM | POA: Diagnosis not present

## 2016-04-10 DIAGNOSIS — I10 Essential (primary) hypertension: Secondary | ICD-10-CM

## 2016-04-10 DIAGNOSIS — R413 Other amnesia: Secondary | ICD-10-CM

## 2016-04-10 DIAGNOSIS — N401 Enlarged prostate with lower urinary tract symptoms: Secondary | ICD-10-CM | POA: Diagnosis not present

## 2016-04-10 DIAGNOSIS — I129 Hypertensive chronic kidney disease with stage 1 through stage 4 chronic kidney disease, or unspecified chronic kidney disease: Secondary | ICD-10-CM | POA: Insufficient documentation

## 2016-04-10 MED ORDER — TERAZOSIN HCL 10 MG PO CAPS: 10.0000 mg | ORAL_CAPSULE | Freq: Every day | ORAL | 2 refills | Status: DC

## 2016-04-10 MED ORDER — APOAEQUORIN 20 MG PO CAPS: 20.0000 mg | ORAL_CAPSULE | Freq: Every day | ORAL | 3 refills | Status: DC

## 2016-04-10 MED ORDER — SIMVASTATIN 20 MG PO TABS: 20.0000 mg | ORAL_TABLET | Freq: Every day | ORAL | 2 refills | Status: DC

## 2016-04-10 NOTE — Assessment & Plan Note (Signed)
Stable chronic BPH with some mild/controlled lower urinary tract symptoms (LUTS), without any evidence of obstruction. - AUA BPH score 8, no comparison - On terazosin, alpha blocker (no other meds tried) - Last PSA unknown / Last DRE 1-2 years ago - No known personal/family history of prostate CA  Plan: 1. Continue terazosin 10mg  daily, seems to be controlling BPH symptoms and helping BP 2. Reviewed old records from [facility] not on epic, and seems by history patient has [facility], patient does not recall this 3. Follow-up 3-4 months for annual physical, re-evaluate BPH symptoms, check PSA, request outside [place] records, and consider urology referral if needed

## 2016-04-10 NOTE — Assessment & Plan Note (Signed)
Mild cognitive memory loss by report, did not focus on this today Refilled apoaequorin memory loss supplement, although advised him this would likely not be covered by mail order, sent 90 day supply, most likely he needs to purchase OTC if wants to continue

## 2016-04-10 NOTE — Patient Instructions (Signed)
Thank you for coming in to clinic today.  1. Check your blood pressure at [facility] once a week for next few weeks. 2. Refilled Cholesterol medication and prostate medication  You will be due for FASTING BLOOD WORK (no food or drink after midnight before, only water or coffee without cream/sugar on the morning of)  - Please go ahead and schedule a "Lab Only" visit in the morning at the clinic for lab draw in the next 3 months - Make sure Lab Only appointment is at least 1-2 weeks before your next appointment, so that results will be available  For Lab Results, once available within 2-3 days of blood draw, you can can log in to MyChart online to view your results and a brief explanation. Also, we can discuss results at next follow-up visit.   Please schedule a follow-up appointment with Dr. Parks Ranger within 3 months for Annual physical  If you have any other questions or concerns, please feel free to call the clinic or send a message through [facility]. You may also schedule an earlier appointment if necessary.  Nobie Putnam, DO [place]

## 2016-04-10 NOTE — Assessment & Plan Note (Signed)
Initial BP mildly elevated, improved on manual re-check. Prior readings SBP 140-150 Prior labs with inc Cr 1.2, baseline, likely CKD-II Patient was unaware of diagnosis HTN  Plan: 1. Continue current treatment with Terazosin alpha blocker 10mg  daily, also for BPH 2. Check BP outside office at [place], record readings, bring to next visit 3. Encouraged lifestyle modifications 4. Follow-up 3-4 months annual physical, labs

## 2016-04-10 NOTE — Assessment & Plan Note (Signed)
Suspected stable lipids on statin, no recent lipid panel Continue Simvastatin 20mg  daily, tolerating well without myalgias Check future fasting lipids 3-4 months with annual physical

## 2016-04-10 NOTE — Progress Notes (Signed)
Subjective:    Patient ID: [name], male    DOB: Aug 12, 1934, 81 y.o.   MRN: [id number]  [name] is a 81 y.o. male presenting on 04/10/2016 for Hyperlipidemia (follow up)  Last visit to PCP reportedly here at [facility] >2 years ago, charts not in [place], on prior EMR.  HPI  HYPERLIPIDEMIA: - Reports no known concerns. Last lipid panel unknown, not fasting today - Currently taking Simvastatin 20mg  nightly , tolerating well without side effects or myalgias - Limited regular exercise - Admits weight gain over past 1 year, +20 lbs approx  CHRONIC HTN: Reports does not recall chronic history of HTN. Does not check BP regularly. Current Meds - Terazosin 10mg  daily for BPH   Reports good compliance, took meds today. Tolerating well, w/o complaints. Denies CP, dyspnea, HA, edema, dizziness / lightheadedness  BPH: - Reports previously diagnosed with BPH >20 years ago, unsure onset of symptoms at that time. He was previously seen at [facility]. Prior treatment with Terazosin and has been on it ever since, no other medications tried. Unsure of last PSA, has had prior DRE does not recall what he has been told about his prostate before.  AUA BPH Symptom Score over past 1 month 1. Sensation of not emptying bladder post void - 1 2. Urinate less than 2 hour after finish last void - 0 3. Start/Stop several times during void - 3 4. Difficult to postpone urination - 1 5. Weak urinary stream - 0 6. Push or strain urination - 1 7. Nocturia - 1 times  Total Score: 8 (Mild BPH symptoms)  Health Maintenance: - Last colonoscopy per outside records 2010 with 2 polyps   Social History  Substance Use Topics  . Smoking status: Former Research scientist (life sciences)  . Smokeless tobacco: Never Used  . Alcohol use No    Review of Systems Per HPI unless specifically indicated above     Objective:    BP 138/76 (BP Location: Left Arm, Cuff Size: Normal)   Pulse 71   Temp 97.8 F (36.6 C) (Oral)   Resp 16   Ht 5'  11" (1.803 m)   Wt 229 lb 9.6 oz (104.1 kg)   BMI 32.02 kg/m   Wt Readings from Last 3 Encounters:  04/10/16 229 lb 9.6 oz (104.1 kg)  12/18/14 205 lb (93 kg)  12/02/14 202 lb (91.6 kg)    Physical Exam  Constitutional: He appears well-developed and well-nourished. No distress.  Well-appearing, comfortable, cooperative  HENT:  Head: Normocephalic and atraumatic.  Mouth/Throat: Oropharynx is clear and moist.  Eyes: Conjunctivae are normal.  Neck: Normal range of motion. Neck supple.  Cardiovascular: Normal rate and intact distal pulses.   Pulmonary/Chest: Effort normal.  Musculoskeletal: He exhibits no edema.  Neurological: He is alert.  Skin: Skin is warm and dry. He is not diaphoretic.  Psychiatric: His behavior is normal.  Nursing note and vitals reviewed.    I have personally reviewed the following lab results from 11/2014.  Results for orders placed or performed during the hospital encounter of 12/02/14  Lipase, blood  Result Value Ref Range   Lipase 21 (L) 22 - 51 U/L  Comprehensive metabolic panel  Result Value Ref Range   Sodium 139 135 - 145 mmol/L   Potassium 4.0 3.5 - 5.1 mmol/L   Chloride 99 (L) 101 - 111 mmol/L   CO2 29 22 - 32 mmol/L   Glucose, Bld 130 (H) 65 - 99 mg/dL   BUN  15 6 - 20 mg/dL   Creatinine, Ser 1.16 0.61 - 1.24 mg/dL   Calcium 9.1 8.9 - 10.3 mg/dL   Total Protein 7.7 6.5 - 8.1 g/dL   Albumin 4.0 3.5 - 5.0 g/dL   AST 18 15 - 41 U/L   ALT 16 (L) 17 - 63 U/L   Alkaline Phosphatase 83 38 - 126 U/L   Total Bilirubin 0.7 0.3 - 1.2 mg/dL   GFR calc non Af Amer 59 (L) >60 mL/min   GFR calc Af Amer >60 >60 mL/min   Anion gap 11 5 - 15  CBC  Result Value Ref Range   WBC 9.9 3.8 - 10.6 K/uL   RBC 4.66 4.40 - 5.90 MIL/uL   Hemoglobin 15.1 13.0 - 18.0 g/dL   HCT 43.7 40.0 - 52.0 %   MCV 93.9 80.0 - 100.0 fL   MCH 32.4 26.0 - 34.0 pg   MCHC 34.5 32.0 - 36.0 g/dL   RDW 13.0 11.5 - 14.5 %   Platelets 237 150 - 440 K/uL  Basic metabolic panel    Result Value Ref Range   Sodium 137 135 - 145 mmol/L   Potassium 4.4 3.5 - 5.1 mmol/L   Chloride 104 101 - 111 mmol/L   CO2 28 22 - 32 mmol/L   Glucose, Bld 126 (H) 65 - 99 mg/dL   BUN 11 6 - 20 mg/dL   Creatinine, Ser 1.04 0.61 - 1.24 mg/dL   Calcium 8.2 (L) 8.9 - 10.3 mg/dL   GFR calc non Af Amer >60 >60 mL/min   GFR calc Af Amer >60 >60 mL/min   Anion gap 5 5 - 15  CBC  Result Value Ref Range   WBC 9.9 3.8 - 10.6 K/uL   RBC 4.12 (L) 4.40 - 5.90 MIL/uL   Hemoglobin 13.6 13.0 - 18.0 g/dL   HCT 39.1 (L) 40.0 - 52.0 %   MCV 95.0 80.0 - 100.0 fL   MCH 33.0 26.0 - 34.0 pg   MCHC 34.7 32.0 - 36.0 g/dL   RDW 12.9 11.5 - 14.5 %   Platelets 206 150 - 440 K/uL  Protime-INR  Result Value Ref Range   Prothrombin Time 14.6 11.4 - 15.0 seconds   INR 1.12   CBC  Result Value Ref Range   WBC 9.4 3.8 - 10.6 K/uL   RBC 4.77 4.40 - 5.90 MIL/uL   Hemoglobin 15.4 13.0 - 18.0 g/dL   HCT 45.0 40.0 - 52.0 %   MCV 94.3 80.0 - 100.0 fL   MCH 32.3 26.0 - 34.0 pg   MCHC 34.3 32.0 - 36.0 g/dL   RDW 13.1 11.5 - 14.5 %   Platelets 239 150 - 440 K/uL  Basic metabolic panel  Result Value Ref Range   Sodium 139 135 - 145 mmol/L   Potassium 3.9 3.5 - 5.1 mmol/L   Chloride 103 101 - 111 mmol/L   CO2 27 22 - 32 mmol/L   Glucose, Bld 119 (H) 65 - 99 mg/dL   BUN 18 6 - 20 mg/dL   Creatinine, Ser 1.21 0.61 - 1.24 mg/dL   Calcium 8.9 8.9 - 10.3 mg/dL   GFR calc non Af Amer 56 (L) >60 mL/min   GFR calc Af Amer >60 >60 mL/min   Anion gap 9 5 - 15  Magnesium  Result Value Ref Range   Magnesium 2.1 1.7 - 2.4 mg/dL  Phosphorus  Result Value Ref Range   Phosphorus 3.7 2.5 -  4.6 mg/dL  Surgical pathology  Result Value Ref Range   SURGICAL PATHOLOGY      Surgical Pathology CASE: [id number] PATIENT: [name] Surgical Pathology Report     SPECIMEN SUBMITTED: A. Stomach, cbx  CLINICAL HISTORY: possible H. pylori  PRE-OPERATIVE DIAGNOSIS: gastric outlet obstruction  POST-OPERATIVE  DIAGNOSIS: Gastric ulcers, gastric outlet obstruction     DIAGNOSIS: A. STOMACH; COLD BIOPSY: - MODERATE CHRONIC GASTRITIS WITH MILD TO MODERATE ACTIVITY, HELICOBACTER PYLORI-ASSOCIATED. - NEGATIVE FOR DYSPLASIA AND MALIGNANCY.   GROSS DESCRIPTION: A. Labeled: Cold biopsy of stomach for H. pylori Tissue Fragment(s): 1 Measurement: 0.5 cm Comment: Tan tissue fragment  Entirely submitted in cassette(s): 1    Final Diagnosis performed by Delorse Lek, MD.  Electronically signed 12/05/2014 11:20:35AM    The electronic signature indicates that the named Attending Pathologist has evaluated the specimen  Technical component performed at [facility], [address], [place], [place] [address] Lab: [phone] Dir: Darrick Penna. Gunnar Fusi, MD  Professional component performed at [facility], [facility], [place], [place], [place] [address] Lab: [phone] Dir: Dellia Nims. Rubinas, MD        Assessment & Plan:   Problem List Items Addressed This Visit    Memory loss    Mild cognitive memory loss by report, did not focus on this today Refilled apoaequorin memory loss supplement, although advised him this would likely not be covered by mail order, sent 90 day supply, most likely he needs to purchase OTC if wants to continue      Relevant Medications   Apoaequorin (PREVAGEN EXTRA STRENGTH) 20 MG CAPS   Hypertension    Initial BP mildly elevated, improved on manual re-check. Prior readings SBP 140-150 Prior labs with inc Cr 1.2, baseline, likely CKD-II Patient was unaware of diagnosis HTN  Plan: 1. Continue current treatment with Terazosin alpha blocker 10mg  daily, also for BPH 2. Check BP outside office at [place], record readings, bring to next visit 3. Encouraged lifestyle modifications 4. Follow-up 3-4 months annual physical, labs      Relevant Medications   simvastatin (ZOCOR) 20 MG tablet   terazosin (HYTRIN) 10 MG capsule   HLD (hyperlipidemia)     Suspected stable lipids on statin, no recent lipid panel Continue Simvastatin 20mg  daily, tolerating well without myalgias Check future fasting lipids 3-4 months with annual physical      Relevant Medications   simvastatin (ZOCOR) 20 MG tablet   terazosin (HYTRIN) 10 MG capsule   BPH with obstruction/lower urinary tract symptoms - Primary    Stable chronic BPH with some mild/controlled lower urinary tract symptoms (LUTS), without any evidence of obstruction. - AUA BPH score 8, no comparison - On terazosin, alpha blocker (no other meds tried) - Last PSA unknown / Last DRE 1-2 years ago - No known personal/family history of prostate CA  Plan: 1. Continue terazosin 10mg  daily, seems to be controlling BPH symptoms and helping BP 2. Reviewed old records from [facility] not on epic, and seems by history patient has [facility], patient does not recall this 3. Follow-up 3-4 months for annual physical, re-evaluate BPH symptoms, check PSA, request outside [place] records, and consider urology referral if needed      Relevant Medications   terazosin (HYTRIN) 10 MG capsule      Meds ordered this encounter  Medications  . DISCONTD: Apoaequorin (PREVAGEN EXTRA STRENGTH PO)    Sig: Take by mouth.  . simvastatin (ZOCOR) 20 MG tablet  Sig: Take 1 tablet (20 mg total) by mouth at bedtime.    Dispense:  90 tablet    Refill:  2  . terazosin (HYTRIN) 10 MG capsule    Sig: Take 1 capsule (10 mg total) by mouth at bedtime.    Dispense:  90 capsule    Refill:  2  . Apoaequorin (PREVAGEN EXTRA STRENGTH) 20 MG CAPS    Sig: Take 20 mg by mouth daily.    Dispense:  90 capsule    Refill:  3      Follow up plan: Return in about 3 months (around 07/09/2016) for Annual physical.  Nobie Putnam, DO [facility] 04/10/2016, 3:10 PM

## 2016-05-14 ENCOUNTER — Telehealth: Payer: Self-pay | Admitting: Family Medicine

## 2016-05-14 NOTE — Telephone Encounter (Signed)
Pt was told to do something by the end of the month and he's not sure if he needs to do labs.  Please check and call pt [phone]

## 2016-05-15 NOTE — Telephone Encounter (Signed)
Pt advised he doesn't have appointment with Dr. Raliegh Ip until 3/17 and he will get lab work done before appointment.

## 2016-07-15 ENCOUNTER — Other Ambulatory Visit: Payer: Self-pay | Admitting: Family Medicine

## 2016-07-15 DIAGNOSIS — E782 Mixed hyperlipidemia: Secondary | ICD-10-CM

## 2016-07-15 DIAGNOSIS — E43 Unspecified severe protein-calorie malnutrition: Secondary | ICD-10-CM

## 2016-07-15 DIAGNOSIS — Z0000 Encounter for general adult medical examination without abnormal findings: Secondary | ICD-10-CM

## 2016-07-15 DIAGNOSIS — I10 Essential (primary) hypertension: Secondary | ICD-10-CM

## 2016-07-15 DIAGNOSIS — N401 Enlarged prostate with lower urinary tract symptoms: Secondary | ICD-10-CM

## 2016-07-15 DIAGNOSIS — Z125 Encounter for screening for malignant neoplasm of prostate: Secondary | ICD-10-CM

## 2016-07-15 DIAGNOSIS — R7309 Other abnormal glucose: Secondary | ICD-10-CM

## 2016-07-15 DIAGNOSIS — N138 Other obstructive and reflux uropathy: Secondary | ICD-10-CM

## 2016-07-20 ENCOUNTER — Other Ambulatory Visit: Payer: Medicare Other

## 2016-07-20 DIAGNOSIS — N401 Enlarged prostate with lower urinary tract symptoms: Secondary | ICD-10-CM | POA: Diagnosis not present

## 2016-07-20 DIAGNOSIS — Z0000 Encounter for general adult medical examination without abnormal findings: Secondary | ICD-10-CM | POA: Diagnosis not present

## 2016-07-20 DIAGNOSIS — E782 Mixed hyperlipidemia: Secondary | ICD-10-CM | POA: Diagnosis not present

## 2016-07-20 DIAGNOSIS — E43 Unspecified severe protein-calorie malnutrition: Secondary | ICD-10-CM | POA: Diagnosis not present

## 2016-07-20 DIAGNOSIS — N138 Other obstructive and reflux uropathy: Secondary | ICD-10-CM | POA: Diagnosis not present

## 2016-07-20 DIAGNOSIS — I10 Essential (primary) hypertension: Secondary | ICD-10-CM | POA: Diagnosis not present

## 2016-07-20 DIAGNOSIS — R7309 Other abnormal glucose: Secondary | ICD-10-CM | POA: Diagnosis not present

## 2016-07-20 DIAGNOSIS — Z125 Encounter for screening for malignant neoplasm of prostate: Secondary | ICD-10-CM | POA: Diagnosis not present

## 2016-07-20 LAB — CBC WITH DIFFERENTIAL/PLATELET
BASOS ABS: 0 {cells}/uL (ref 0–200)
Basophils Relative: 0 %
EOS ABS: 315 {cells}/uL (ref 15–500)
EOS PCT: 5 %
HCT: 43.2 % (ref 38.5–50.0)
Hemoglobin: 14.2 g/dL (ref 13.2–17.1)
LYMPHS PCT: 30 %
Lymphs Abs: 1890 cells/uL (ref 850–3900)
MCH: 32.3 pg (ref 27.0–33.0)
MCHC: 32.9 g/dL (ref 32.0–36.0)
MCV: 98.4 fL (ref 80.0–100.0)
MONOS PCT: 11 %
MPV: 9.6 fL (ref 7.5–12.5)
Monocytes Absolute: 693 cells/uL (ref 200–950)
NEUTROS PCT: 54 %
Neutro Abs: 3402 cells/uL (ref 1500–7800)
Platelets: 154 10*3/uL (ref 140–400)
RBC: 4.39 MIL/uL (ref 4.20–5.80)
RDW: 13.6 % (ref 11.0–15.0)
WBC: 6.3 10*3/uL (ref 3.8–10.8)

## 2016-07-20 LAB — COMPLETE METABOLIC PANEL WITH GFR
ALT: 13 U/L (ref 9–46)
AST: 17 U/L (ref 10–35)
Albumin: 4 g/dL (ref 3.6–5.1)
Alkaline Phosphatase: 60 U/L (ref 40–115)
BUN: 23 mg/dL (ref 7–25)
CALCIUM: 8.8 mg/dL (ref 8.6–10.3)
CO2: 28 mmol/L (ref 20–31)
CREATININE: 1.24 mg/dL — AB (ref 0.70–1.18)
Chloride: 104 mmol/L (ref 98–110)
GFR, Est African American: 64 mL/min (ref >=60)
GFR, Est Non African American: 55 mL/min — ABNORMAL LOW (ref >=60)
GLUCOSE: 99 mg/dL (ref 65–99)
Potassium: 4.1 mmol/L (ref 3.5–5.3)
SODIUM: 140 mmol/L (ref 135–146)
Total Bilirubin: 0.4 mg/dL (ref 0.2–1.2)
Total Protein: 6.9 g/dL (ref 6.1–8.1)

## 2016-07-20 LAB — LIPID PANEL
CHOLESTEROL: 144 mg/dL (ref <200)
HDL: 43 mg/dL (ref >40)
LDL CALC: 80 mg/dL (ref <100)
Total CHOL/HDL Ratio: 3.3 Ratio (ref <5.0)
Triglycerides: 103 mg/dL (ref <150)
VLDL: 21 mg/dL (ref <30)

## 2016-07-21 LAB — PSA, TOTAL WITH REFLEX TO PSA, FREE: PSA, TOTAL: 11.8 ng/mL — AB (ref <=4.0)

## 2016-07-21 LAB — HEMOGLOBIN A1C
HEMOGLOBIN A1C: 5.4 % (ref <5.7)
MEAN PLASMA GLUCOSE: 108 mg/dL

## 2016-07-22 ENCOUNTER — Encounter: Payer: Self-pay | Admitting: Family Medicine

## 2016-07-22 ENCOUNTER — Ambulatory Visit (INDEPENDENT_AMBULATORY_CARE_PROVIDER_SITE_OTHER): Payer: Medicare Other | Admitting: Family Medicine

## 2016-07-22 VITALS — BP 143/83 | HR 64 | Temp 97.9°F | Resp 16 | Ht 71.0 in | Wt 232.0 lb

## 2016-07-22 DIAGNOSIS — N138 Other obstructive and reflux uropathy: Secondary | ICD-10-CM

## 2016-07-22 DIAGNOSIS — N401 Enlarged prostate with lower urinary tract symptoms: Secondary | ICD-10-CM | POA: Diagnosis not present

## 2016-07-22 DIAGNOSIS — I10 Essential (primary) hypertension: Secondary | ICD-10-CM | POA: Diagnosis not present

## 2016-07-22 DIAGNOSIS — Z0000 Encounter for general adult medical examination without abnormal findings: Secondary | ICD-10-CM

## 2016-07-22 DIAGNOSIS — R600 Localized edema: Secondary | ICD-10-CM | POA: Insufficient documentation

## 2016-07-22 DIAGNOSIS — R7309 Other abnormal glucose: Secondary | ICD-10-CM | POA: Diagnosis not present

## 2016-07-22 DIAGNOSIS — E669 Obesity, unspecified: Secondary | ICD-10-CM | POA: Diagnosis not present

## 2016-07-22 DIAGNOSIS — R972 Elevated prostate specific antigen [PSA]: Secondary | ICD-10-CM

## 2016-07-22 DIAGNOSIS — E782 Mixed hyperlipidemia: Secondary | ICD-10-CM | POA: Diagnosis not present

## 2016-07-22 MED ORDER — ROSUVASTATIN CALCIUM 20 MG PO TABS: 20.0000 mg | ORAL_TABLET | Freq: Every day | ORAL | 3 refills | Status: DC

## 2016-07-22 NOTE — Assessment & Plan Note (Signed)
Elevated PSA 11.8 now in setting of known chronic BPH with some mild/controlled LUTS >20 years - AUA BPH score 8, stable - On terazosin, alpha blocker (no other meds tried) - Last PSA 11.8 (07/15/16, no prior for comparison) Last DRE 1-2 years ago - No known personal/family history of prostate CA  Plan: 1. Referral to [facility] by preference for abnormal PSA, he is not interested in returning to [facility]. Patient to discuss options such as trending PSA and possible prostate biopsy 2. Declined DRE today, will need at Urology office

## 2016-07-22 NOTE — Assessment & Plan Note (Addendum)
Gradual weight gain, now elevated BMI >32. Possible some weight gain is fluid retention. Risk factors with HLD controlled, HTN controlled - Encourage improve lifestyle modifications with regular exercise

## 2016-07-22 NOTE — Assessment & Plan Note (Signed)
Stable chronic BPH with some mild/controlled lower urinary tract symptoms (LUTS), without any evidence of obstruction. - AUA BPH score 8, stable - On terazosin, alpha blocker (no other meds tried) - Last PSA 11.8 (07/15/16, no prior for comparison) Last DRE 1-2 years ago - No known personal/family history of prostate CA  Plan: 1. Continue terazosin 10mg  daily, seems to be controlling BPH symptoms and helping BP. Deferred DRE today. 2. See A&P for PSA - will refer patient to local [facility] by preference for abnormal PSA, he is not interested in returning to [facility] 3. Follow-up as needed

## 2016-07-22 NOTE — Assessment & Plan Note (Signed)
Clinically with significant bilateral lower leg / ankle/foot +1-2 pitting edema, L slightly inc than R but both similar, without significant red flags such as asymmetry erythema or pain. - Unsure duration given patient's difficulty with memory, but seems to be relatively worse recently by report - Differential is broad: likely multifactorial with chronic venous stasis, poor hydration, limited elevation, however given pitting nature of edema, consider other etiology such as CKD vs CHF. Known CKD-III but seems chronic and not worsening recently. No prior ECHO, no known CAD, seems less likely but may be related to chronic HTN  Plan: 1. Recommend conservative therapy first with low salt diet, elevation of legs above heart level, compression stockings, stay active 2. Follow-up in 3 months re-evaluate, and likely will proceed with ECHO if still present or worsening, can return sooner gave strict criteria if inc edema, difficulty breathing or concerns for CP / CHF when to go to hospital ED vs office - in future he may benefit from low dose thiazide vs ACEi, prefer to avoid PRN lasix

## 2016-07-22 NOTE — Assessment & Plan Note (Signed)
Improved BP today, consistent with home readings of improved BP - Complication with CKD-III, last few GFR results < 60 more consistent than CKD-II  Plan: 1. Continue current Terazosin alpha blocker 10mg  daily also for BPH 2. Discussion on possible adding low dose ACEi vs ARB for both BP and reducing risk of progression CKD, patient declines additional med today, will re-consider in future especially if BP increases or kidney declines 3. Continue monitor BP outside office, bring to next visit 4. Encourage stay active, regular exercise, improve water hydration 5. Follow-up 3 months

## 2016-07-22 NOTE — Patient Instructions (Signed)
Thank you for coming in to clinic today.  1.  Referral to Urology due to elevated PSA prostate, concern for possible prostate cancer, but may be related to long history of BPH - if you don't hear back with an appointment within 2 weeks, then go ahead and give them a call.  [place] [address] [place] Phone: [phone]  2. For Swelling - Compression stockings for both legs to apply pressure and reduce swelling allowing more support - Elevation - if significant swelling, lift leg above heart level (toes above your nose) to help reduce swelling, most helpful at night after day of being on your feet  Improve hydration with more water  If worsening swelling, shortness of breath, chest pain or pressure, then need to return sooner for re-evaluation or even go to hospital Emergency department if severe.  Next time if not improved will discuss ECHOcardiogram, heart imaging to check for potential heart failure abnormality.  3. Keep checking BP, seems to be doing better  Please schedule a follow-up appointment with Dr. Parks Ranger in 3 months for follow-up Lower Extremity Edema, BP  If you have any other questions or concerns, please feel free to call the clinic or send a message through [place]. You may also schedule an earlier appointment if necessary.  Jonathan Putnam, DO [place]

## 2016-07-22 NOTE — Assessment & Plan Note (Signed)
Stable lipids, controlled on statin. - Calculated ASCVD risk >37%, based primarily on age  Plan: 1. Reviewed other statin options for increased potency, has never had myalgias, switch to Rosuvastatin 20mg  nightly for increased statin potency, can increase further to 40mg  if needed, discussed ASCVD risk reduction. Continue taking ASA 81

## 2016-07-22 NOTE — Assessment & Plan Note (Signed)
Prior abnormal glucose readings >100, now A1c 5.4, elevated but not at range of Pre-Diabetes - Likely multifactorial - Encouraged to improve low carb low sugar diet, improve regular exercise - Follow A1c yearly

## 2016-07-22 NOTE — Progress Notes (Signed)
Subjective:    Patient ID: [name], male    DOB: [birth year], 82 y.o.   MRN: [id number]  [name] is a 82 y.o. male presenting on 07/22/2016 for Annual Exam   HPI   Bilateral Lower Extremity Edema: - Reports some chronic history of lower leg swelling, however he does not recall it causing any significant problems. Now he states that his wife noticed it seemed to be worsening in Left foot over past 2-3 weeks. He wanted to get it checked out. - States he does not stay well hydrated, only drinks < 16 oz water most days - Additionally reports he has been sleeping sitting up more often to limit "saliva" build up, but states he can lay down and it does not affect his breathing - Admits to some gradual not acute weight gain +30 lbs in 1.5 years, up to 222 lb - Never on fluid pill - No prior history of blood clot or DVT or PE - Denies any known prior diagnosis of heart failure before, has not had ECHO - Denies any chest pain or pressure, dyspnea, worsening exertional symptoms, leg pain, redness or asymmetry  HYPERLIPIDEMIA / OBESITY BMI >32 - Chronic history. Has been stable on statin. Last results from 07/20/16 with controlled cholesterol - Currently taking Simvastatin 10m nightly , tolerating well without side effects or myalgias - he does not recall taking any other cholesterol medication in past - he stays active with walking mostly for exercise, less than he used to - Admits some weight gain, see above gradual  CHRONIC HTN: Since last visit he has been checking BP readings at home, but he left all the readings at home. He cannot recall exactly but thinks the range was 120-130s / 65-80s on average. Current Meds - Terazosin 146mdaily for BPH   Reports good compliance, took meds today. Tolerating well, w/o complaints. - He has not been on ACEi or ARB in past Denies CP, dyspnea, HA, edema, dizziness / lightheadedness  CKD-III - Reports no significant known history of kidney  disease - Last lab with Creatinine 1.24, similar to previous - He does not take regular NSAIDs - Limited hydration with water intake, see above  BPH with LUTS / Elevated Abnormal PSA - Last visit 03/2016 discussed BPH symptoms, see note for background with chronic h/o BPH >20 years, [facility] on [facility] only, has not new complaints - As discussed before, he does not recall prior PSA testing or DRE results - Today reviewed elevated abnormal PSA screen of 11.8, he is interested in local Urology follow-up instead of [place] [facility] [place] and [place] - He has never had a prostate biopsy  AUA BPH Symptom Score over past 1 month 1. Sensation of not emptying bladder post void - 1 2. Urinate less than 2 hour after finish last void - 0 3. Start/Stop several times during void - 3 4. Difficult to postpone urination - 1 5. Weak urinary stream - 0 6. Push or strain urination - 1 7. Nocturia - 1 times  Total Score: 8 (Mild BPH symptoms) - Score obtained 04/10/16 (did not recalculate score today)  Health Maintenance: - Last colonoscopy per outside records 2010 with 2 polyps   Past Medical History:  Diagnosis Date  . Bowel obstruction   . BPH (benign prostatic hypertrophy)   . Gastric outlet obstruction   . High cholesterol   . Hyperchloremia   .  Hypertension   . Pancreatitis    Past Surgical History:  Procedure Laterality Date  . ESOPHAGOGASTRODUODENOSCOPY N/A 12/03/2014   Procedure: ESOPHAGOGASTRODUODENOSCOPY (EGD);  Surgeon: Hulen Luster, MD;  Location: [facility] ENDOSCOPY;  Service: Endoscopy;  Laterality: N/A;   Social History   Social History  . Marital status: Married    Spouse name: N/A  . Number of children: N/A  . Years of education: N/A   Occupational History  . Not on file.   Social History Main Topics  . Smoking status: Former Research scientist (life sciences)  . Smokeless tobacco: Never Used  . Alcohol use No  . Drug use: No  .  Sexual activity: No   Other Topics Concern  . Not on file   Social History Narrative  . No narrative on file   Family History  Problem Relation Age of Onset  . Heart disease Mother   . Heart disease Father   . Heart disease Brother    Current Outpatient Prescriptions on File Prior to Visit  Medication Sig  . Apoaequorin (PREVAGEN EXTRA STRENGTH) 20 MG CAPS Take 20 mg by mouth daily.  Marland Kitchen aspirin EC 81 MG tablet Take 81 mg by mouth daily.  Marland Kitchen terazosin (HYTRIN) 10 MG capsule Take 1 capsule (10 mg total) by mouth at bedtime.   No current facility-administered medications on file prior to visit.     Review of Systems  Constitutional: Negative for activity change, appetite change, chills, diaphoresis, fatigue, fever and unexpected weight change.       Gradual weight gain  HENT: Negative for congestion, hearing loss and sinus pressure.   Eyes: Negative for visual disturbance.  Respiratory: Negative for cough, chest tightness, shortness of breath and wheezing.   Cardiovascular: Positive for leg swelling (bilateral lower leg worst ankle/foot). Negative for chest pain and palpitations.  Gastrointestinal: Negative for abdominal pain, anal bleeding, blood in stool, constipation, diarrhea, nausea and vomiting.  Endocrine: Negative for cold intolerance and polyuria.  Genitourinary: Negative for decreased urine volume, difficulty urinating (On alpha blocker), dysuria, frequency, hematuria and urgency.  Musculoskeletal: Negative for arthralgias, back pain and neck pain.  Skin: Negative for rash.  Allergic/Immunologic: Negative for environmental allergies.  Neurological: Negative for dizziness, weakness, light-headedness, numbness and headaches.  Hematological: Negative for adenopathy.  Psychiatric/Behavioral: Negative for behavioral problems, confusion, dysphoric mood, self-injury, sleep disturbance and suicidal ideas. The patient is not nervous/anxious.        Memory loss   Per HPI unless  specifically indicated above     Objective:    BP (!) 143/83   Pulse 64   Temp 97.9 F (36.6 C) (Oral)   Resp 16   Ht '5\' 11"'  (1.803 m)   Wt 232 lb (105.2 kg)   BMI 32.36 kg/m   Wt Readings from Last 3 Encounters:  07/22/16 232 lb (105.2 kg)  04/10/16 229 lb 9.6 oz (104.1 kg)  12/18/14 205 lb (93 kg)    Physical Exam  Constitutional: He is oriented to person, place, and time. He appears well-developed and well-nourished. No distress.  Well-appearing, comfortable, cooperative  HENT:  Head: Normocephalic and atraumatic.  Mouth/Throat: Oropharynx is clear and moist.  Eyes: Conjunctivae are normal. Right eye exhibits no discharge. Left eye exhibits no discharge.  Neck: Normal range of motion. Neck supple. No thyromegaly present.  Cardiovascular: Normal rate, regular rhythm, normal heart sounds and intact distal pulses.   No murmur heard. Pulmonary/Chest: Effort normal and breath sounds normal. No respiratory distress. He has no wheezes. He  has no rales.  Good air movement, mostly clear some possible faint bibasilar crackles, some improve with deeper inspiration.  Abdominal: Soft. Bowel sounds are normal. He exhibits no distension. There is no tenderness.  Genitourinary:  Genitourinary Comments: Deferred DRE today by patient preference  Musculoskeletal: He exhibits edema (bilateral lower extremity edema mid calves down to ankle/foot worse, L (+2) and R (+1), non tender, no erythema).  Lymphadenopathy:    He has no cervical adenopathy.  Neurological: He is alert and oriented to person, place, and time.  Skin: Skin is warm and dry. No rash noted. He is not diaphoretic. No erythema.  Some dry appearance to skin lower extremities, consistent with chronic venous stasis changes  Psychiatric: He has a normal mood and affect. His behavior is normal.  Well groomed, good eye contact, normal speech and thoughts  Nursing note and vitals reviewed.  I have personally reviewed the following  lab results from 07/15/16.  Results for orders placed or performed in visit on 07/15/16  COMPLETE METABOLIC PANEL WITH GFR  Result Value Ref Range   Sodium 140 135 - 146 mmol/L   Potassium 4.1 3.5 - 5.3 mmol/L   Chloride 104 98 - 110 mmol/L   CO2 28 20 - 31 mmol/L   Glucose, Bld 99 65 - 99 mg/dL   BUN 23 7 - 25 mg/dL   Creat 1.24 (H) 0.70 - 1.18 mg/dL   Total Bilirubin 0.4 0.2 - 1.2 mg/dL   Alkaline Phosphatase 60 40 - 115 U/L   AST 17 10 - 35 U/L   ALT 13 9 - 46 U/L   Total Protein 6.9 6.1 - 8.1 g/dL   Albumin 4.0 3.6 - 5.1 g/dL   Calcium 8.8 8.6 - 10.3 mg/dL   GFR, Est African American 64 >=60 mL/min   GFR, Est Non African American 55 (L) >=60 mL/min  Lipid panel  Result Value Ref Range   Cholesterol 144 <200 mg/dL   Triglycerides 103 <150 mg/dL   HDL 43 >40 mg/dL   Total CHOL/HDL Ratio 3.3 <5.0 Ratio   VLDL 21 <30 mg/dL   LDL Cholesterol 80 <100 mg/dL  Hemoglobin A1c  Result Value Ref Range   Hgb A1c MFr Bld 5.4 <5.7 %   Mean Plasma Glucose 108 mg/dL  PSA, Total with Reflex to PSA, Free  Result Value Ref Range   PSA, Total 11.8 (H) <=4.0 ng/mL  CBC with Differential/Platelet  Result Value Ref Range   WBC 6.3 3.8 - 10.8 K/uL   RBC 4.39 4.20 - 5.80 MIL/uL   Hemoglobin 14.2 13.2 - 17.1 g/dL   HCT 43.2 38.5 - 50.0 %   MCV 98.4 80.0 - 100.0 fL   MCH 32.3 27.0 - 33.0 pg   MCHC 32.9 32.0 - 36.0 g/dL   RDW 13.6 11.0 - 15.0 %   Platelets 154 140 - 400 K/uL   MPV 9.6 7.5 - 12.5 fL   Neutro Abs 3,402 1,500 - 7,800 cells/uL   Lymphs Abs 1,890 850 - 3,900 cells/uL   Monocytes Absolute 693 200 - 950 cells/uL   Eosinophils Absolute 315 15 - 500 cells/uL   Basophils Absolute 0 0 - 200 cells/uL   Neutrophils Relative % 54 %   Lymphocytes Relative 30 %   Monocytes Relative 11 %   Eosinophils Relative 5 %   Basophils Relative 0 %   Smear Review Criteria for review not met       Assessment & Plan:   Problem List  Items Addressed This Visit    Obesity (BMI 30.0-34.9)     Gradual weight gain, now elevated BMI >32. Possible some weight gain is fluid retention. Risk factors with HLD controlled, HTN controlled - Encourage improve lifestyle modifications with regular exercise      Hypertension    Improved BP today, consistent with home readings of improved BP - Complication with CKD-III, last few GFR results < 60 more consistent than CKD-II  Plan: 1. Continue current Terazosin alpha blocker 42m daily also for BPH 2. Discussion on possible adding low dose ACEi vs ARB for both BP and reducing risk of progression CKD, patient declines additional med today, will re-consider in future especially if BP increases or kidney declines 3. Continue monitor BP outside office, bring to next visit 4. Encourage stay active, regular exercise, improve water hydration 5. Follow-up 3 months      Relevant Medications   rosuvastatin (CRESTOR) 20 MG tablet   HLD (hyperlipidemia)    Stable lipids, controlled on statin. - Calculated ASCVD risk >37%, based primarily on age  Plan: 1. Reviewed other statin options for increased potency, has never had myalgias, switch to Rosuvastatin 281mnightly for increased statin potency, can increase further to 4061mf needed, discussed ASCVD risk reduction. Continue taking ASA 81      Relevant Medications   rosuvastatin (CRESTOR) 20 MG tablet   Elevated PSA, between 10 and less than 20 ng/ml    Elevated PSA 11.8 now in setting of known chronic BPH with some mild/controlled LUTS >20 years - AUA BPH score 8, stable - On terazosin, alpha blocker (no other meds tried) - Last PSA 11.8 (07/15/16, no prior for comparison) Last DRE 1-2 years ago - No known personal/family history of prostate CA  Plan: [facility]. [facility], [facility] discuss options such as trending PSA and possible prostate biopsy 2. Declined DRE today, will need at Urology  office      Relevant Orders   Ambulatory referral to Urology   BPH with obstruction/lower urinary tract symptoms    Stable chronic BPH with some mild/controlled lower urinary tract symptoms (LUTS), without any evidence of obstruction. - AUA BPH score 8, stable - On terazosin, alpha blocker (no other meds tried) - Last PSA 11.8 (07/15/16, no prior for comparison) Last DRE 1-2 years ago - No known personal/family history of prostate CA  Plan: 1. Continue terazosin 56m12mily, seems to be controlling BPH symptoms and helping BP. Deferred DRE today. 2. See A&P for PSA - [facility], [facility]      Relevant Orders   Ambulatory referral to Urology   Bilateral lower extremity edema    Clinically with significant bilateral lower leg / ankle/foot +1-2 pitting edema, L slightly inc than R but both similar, without significant red flags such as asymmetry erythema or pain. - Unsure duration given patient's difficulty with memory, but seems to be relatively worse recently by report - Differential is broad: likely multifactorial with chronic venous stasis, poor hydration, limited elevation, however given pitting nature of edema, consider other etiology such as CKD vs CHF. Known CKD-III but seems chronic and not worsening recently. No prior ECHO, no known CAD, seems less likely but may be related to chronic HTN  Plan: 1. Recommend conservative  therapy first with low salt diet, elevation of legs above heart level, compression stockings, stay active 2. Follow-up in 3 months re-evaluate, and likely will proceed with ECHO if still present or worsening, can return sooner gave strict criteria if inc edema, difficulty breathing or concerns for CP / CHF when to go to hospital ED vs office - in future he may benefit from low dose thiazide vs ACEi, prefer to avoid PRN lasix       Abnormal glucose    Prior abnormal glucose readings >100, now A1c 5.4, elevated but not at range of Pre-Diabetes - Likely multifactorial - Encouraged to improve low carb low sugar diet, improve regular exercise - Follow A1c yearly       Other Visit Diagnoses    Annual physical exam    -  Primary      Meds ordered this encounter  Medications  .               . rosuvastatin (CRESTOR) 20 MG tablet    Sig: Take 1 tablet (20 mg total) by mouth at bedtime.    Dispense:  90 tablet    Refill:  3    Re-sent rx to change instruction from daily to bedtime, please only fill one rx      Follow up plan: Return in about 3 months (around 10/21/2016) for lower extremity edema.  Nobie Putnam, [facility] 07/22/2016, 12:59 PM

## 2016-07-24 ENCOUNTER — Other Ambulatory Visit: Payer: Medicare Other

## 2016-07-25 ENCOUNTER — Encounter: Payer: Self-pay | Admitting: Gynecology

## 2016-07-25 ENCOUNTER — Ambulatory Visit
Admission: EM | Admit: 2016-07-25 | Discharge: 2016-07-25 | Disposition: A | Discharge status: Home / Self Care | Payer: Medicare Other | Attending: Emergency Medicine | Admitting: Emergency Medicine

## 2016-07-25 DIAGNOSIS — R32 Unspecified urinary incontinence: Secondary | ICD-10-CM

## 2016-07-25 DIAGNOSIS — R0981 Nasal congestion: Secondary | ICD-10-CM

## 2016-07-25 MED ORDER — FLUTICASONE PROPIONATE 50 MCG/ACT NA SUSP: 2.0000 | Freq: Every day | NASAL | 0 refills | Status: DC

## 2016-07-25 NOTE — ED Provider Notes (Signed)
HPI  SUBJECTIVE:  [name] is a 82 y.o. male who presents with 2 complaints. First, he reports whitish clearish nasal congestion, copious rhinorrhea and sneezing, the occasional nonproductive cough, occasional wheezing that clears with sneezing and generalized weakness for the past 4 days. He tried Alka-Seltzer plus cold with some improvement in symptoms. No aggravating factors. He denies postnasal drip, sore throat, fevers, ear pain, itchy watery eyes. No sinus pain or pressure, upper dental pain. No chest pain, shortness of breath. Second, he reports an episode of urinary incontinence last night. He has been doing a lot of sneezing and coughing, but he states that he did not know that he was incontinent. He denies dysuria, urgency, frequency, cloudy or odorous urine, hematuria. No abdominal pain, back pain, fecal incontinence, saddle anesthesia. He has never had symptoms like this before. No antibiotics in the past month. He has taken antipyretic in the past 6-8 hours. No sick contacts or double sickening. Past medical history of allergies, BPH, SBO. no history of UTIs, diabetes, hypertension, asthma, emphysema, COPD. He smokes half pack a day for 30 years, quit in 1985. PMD: [facility].    Past Medical History:  Diagnosis Date  . Bowel obstruction   . BPH (benign prostatic hypertrophy)   . Gastric outlet obstruction   . High cholesterol   . Hyperchloremia   . Hypertension   . Pancreatitis     Past Surgical History:  Procedure Laterality Date  . ESOPHAGOGASTRODUODENOSCOPY N/A 12/03/2014   Procedure: ESOPHAGOGASTRODUODENOSCOPY (EGD);  Surgeon: Hulen Luster, MD;  Location: [facility] ENDOSCOPY;  Service: Endoscopy;  Laterality: N/A;    Family History  Problem Relation Age of Onset  . Heart disease Mother   . Heart disease Father   . Heart disease Brother     Social History  Substance Use Topics  . Smoking status: Former Research scientist (life sciences)  . Smokeless tobacco: Never Used  . Alcohol use  No    No current facility-administered medications for this encounter.   Current Outpatient Prescriptions:  .  Apoaequorin (PREVAGEN EXTRA STRENGTH) 20 MG CAPS, Take 20 mg by mouth daily., Disp: 90 capsule, Rfl: 3 .  aspirin EC 81 MG tablet, Take 81 mg by mouth daily., Disp: , Rfl:  .  rosuvastatin (CRESTOR) 20 MG tablet, Take 1 tablet (20 mg total) by mouth at bedtime., Disp: 90 tablet, Rfl: 3 .  terazosin (HYTRIN) 10 MG capsule, Take 1 capsule (10 mg total) by mouth at bedtime., Disp: 90 capsule, Rfl: 2 .  fluticasone (FLONASE) 50 MCG/ACT nasal spray, Place 2 sprays into both nostrils daily., Disp: 16 g, Rfl: 0  No Known Allergies   ROS  As noted in HPI.   Physical Exam  BP (!) 149/62 (BP Location: Left Arm)   Pulse 73   Temp 98.2 F (36.8 C) (Oral)   Resp 16   Wt 232 lb (105.2 kg)   SpO2 97%   BMI 32.36 kg/m   Constitutional: Well developed, well nourished, no acute distress Eyes: PERRL, EOMI, conjunctiva normal bilaterally HENT: Normocephalic, atraumatic,mucus membranes moist. TMs normal bilaterally. Positive clear rhinorrhea. Erythematous but not swollen turbinates. Normal oropharynx. Positive mild postnasal drip.  Respiratory: Clear to auscultation bilaterally, no rales, no wheezing, no rhonchi Cardiovascular: Normal rate and rhythm, no murmurs, no gallops, no rubs GI: Soft, nondistended, normal bowel sounds, no suprapubic or flank tenderness, no rebound, no guarding Back: no CVAT skin: No rash, skin intact Musculoskeletal: Strength in hips and knees 5/5 and equal bilaterally.  EHL, foot flexion and extension 5/5 and equal bilaterally. Sensation over both of his legs grossly intact. Patient is ambulatory independently. No ataxia.  Neurologic: Alert & oriented x 3, CN II-XII grossly intact, no motor deficits, sensation grossly intact Psychiatric: Speech and behavior appropriate   ED Course   Medications - No data to display  No orders of the defined types were  placed in this encounter.  No results found for this or any previous visit (from the past 24 hour(s)). No results found.  ED Clinical Impression  Urinary incontinence, unspecified type  Nasal congestion   ED Assessment/Plan Presentation consistent with allergies versus a URI. Plan to treat this with Claritin, Allegra, or Zyrtec, Flonase and saline nasal irrigation. She Mucinex if the antihistamines don't work. Discussed with patient that I do not think that he needs antibiotics at this point in time and he may return here or see his primary care physician if not better in 6 days for reevaluation and possible antibiotics at that time.   Urinary incontinence: The patient has no urinary symptoms or back pain. Doubt cauda equina. UA not done because patient has no urinary complaints. He has been doing a lot of sneezing and coughing, so his incontinence may have come from that, versus his BPH. This is an isolated incident, so will have patient monitor this. If this should happen again, patient may return here or see his doctor for further workup.   discussed medical decision-making, plan for follow-up with patient. Patient agrees with plan  Meds ordered this encounter  Medications  . fluticasone (FLONASE) 50 MCG/ACT nasal spray    Sig: Place 2 sprays into both nostrils daily.    Dispense:  16 g    Refill:  0    *This clinic note was created using Lobby[employer]. Therefore, there may be occasional mistakes despite careful proofreading.  ?   Melynda Ripple, MD 07/25/16 1200

## 2016-07-25 NOTE — Discharge Instructions (Signed)
It is hard to tell if this is allergies versus an upper respiratory infection. They both have very similar and overlapping symptoms. Try Claritin or Allegra or Zyrtec and if this does not help, discontinue these and start plain Mucinex. Start Flonase 1-2 sprays in each nostril twice a day. Start some saline nasal irrigation with a neti pot or Milta Deiters med sinus rinse. Follow the directions on the box. Return here or see her doctor in 6 days if you're not any better. Return here or see your doctor if you start having urinary symptoms such as urinary urgency, frequency, cloudy or odorous urine, or if it hurts when you urinate. Go immediately to the ER if you have urinary incontinence with abdominal or back pain.

## 2016-07-25 NOTE — ED Triage Notes (Signed)
Patient c/o cough / nasal drip. Patient stated that last pm wet his bed while sleeping.

## 2016-08-12 ENCOUNTER — Ambulatory Visit (INDEPENDENT_AMBULATORY_CARE_PROVIDER_SITE_OTHER): Payer: Medicare Other | Admitting: Urology

## 2016-08-12 DIAGNOSIS — N401 Enlarged prostate with lower urinary tract symptoms: Secondary | ICD-10-CM

## 2016-08-12 DIAGNOSIS — R972 Elevated prostate specific antigen [PSA]: Secondary | ICD-10-CM

## 2016-08-12 DIAGNOSIS — N138 Other obstructive and reflux uropathy: Secondary | ICD-10-CM

## 2016-08-12 LAB — URINALYSIS, COMPLETE
BILIRUBIN UA: NEGATIVE
GLUCOSE, UA: NEGATIVE
Ketones, UA: NEGATIVE
NITRITE UA: NEGATIVE
PH UA: 5.5 (ref 5.0–7.5)
Protein, UA: NEGATIVE
RBC UA: NEGATIVE
Specific Gravity, UA: 1.015 (ref 1.005–1.030)
Urobilinogen, Ur: 0.2 mg/dL (ref 0.2–1.0)

## 2016-08-12 LAB — BLADDER SCAN AMB NON-IMAGING: SCAN RESULT: 84

## 2016-08-12 LAB — MICROSCOPIC EXAMINATION: RBC MICROSCOPIC, UA: NONE SEEN /HPF (ref 0–?)

## 2016-08-12 NOTE — Progress Notes (Signed)
08/12/2016 12:03 PM   [name] [birth year] [id number]  Referring provider: [name], DO [address] [place], [place] [address]  Chief Complaint  Patient presents with  . New Patient (Initial Visit)    Elevated PSA, BPH w/ obstruction    HPI: 82 year old male presents to urology today for further evaluation and management of an elevated PSA.  The patient was noted to have an elevated PSA as part of her routine screening. The patient denies any progression in his urinary tract symptoms. He does have some urinary urgency and associated urge incontinence. He is not sure whether he empties his bladder not, he gets up between 1 in 3 times per night. He denies any hematuria. He denies any dysuria. He has no history of urinary tract infections or prostatitis.  The patient has no significant family history of prostate cancer.  PSA history:  9/12: 2.34 4/18: 11.8     PMH: Past Medical History:  Diagnosis Date  . Bowel obstruction ([place])   . BPH (benign prostatic hypertrophy)   . Gastric outlet obstruction   . High cholesterol   . Hyperchloremia   . Hypertension   . Pancreatitis     Surgical History: Past Surgical History:  Procedure Laterality Date  . ESOPHAGOGASTRODUODENOSCOPY N/A 12/03/2014   Procedure: ESOPHAGOGASTRODUODENOSCOPY (EGD);  Surgeon: Hulen Luster, MD;  Location: [facility] ENDOSCOPY;  Service: Endoscopy;  Laterality: N/A;    Home Medications:  Allergies as of 08/12/2016   No Known Allergies     Medication List       Accurate as of 08/12/16 12:03 PM. Always use your most recent med list.          Apoaequorin 20 MG Caps Commonly known as:  PREVAGEN EXTRA STRENGTH Take 20 mg by mouth daily.   aspirin EC 81 MG tablet Take 81 mg by mouth daily.   fluticasone 50 MCG/ACT nasal spray Commonly known as:  FLONASE Place 2 sprays into both nostrils daily.   simvastatin 20 MG tablet Commonly known as:  ZOCOR   terazosin 10 MG capsule Commonly known  as:  HYTRIN Take 1 capsule (10 mg total) by mouth at bedtime.       Allergies: No Known Allergies  Family History: Family History  Problem Relation Age of Onset  . Heart disease Mother   . Heart disease Father   . Heart disease Brother     Social History:  reports that he has quit smoking. He has never used smokeless tobacco. He reports that he does not drink alcohol or use drugs.  ROS: UROLOGY Frequent Urination?: Yes Hard to postpone urination?: Yes Burning/pain with urination?: No Get up at night to urinate?: Yes Leakage of urine?: Yes Urine stream starts and stops?: Yes Trouble starting stream?: Yes Do you have to strain to urinate?: No Blood in urine?: No Urinary tract infection?: No Sexually transmitted disease?: No Injury to kidneys or bladder?: No Painful intercourse?: No Weak stream?: Yes Erection problems?: No Penile pain?: No  Gastrointestinal Nausea?: No Vomiting?: No Indigestion/heartburn?: No Diarrhea?: No Constipation?: No  Constitutional Fever: No Night sweats?: No Weight loss?: No Fatigue?: No  Skin Skin rash/lesions?: No Itching?: No  Eyes Blurred vision?: No Double vision?: No  Ears/Nose/Throat Sore throat?: No Sinus problems?: No  Hematologic/Lymphatic Swollen glands?: No Easy bruising?: No  Cardiovascular Leg swelling?: No Chest pain?: No  Respiratory Cough?: No Shortness of breath?: No  Endocrine Excessive thirst?: No  Musculoskeletal Back pain?: No Joint pain?: No  Neurological Headaches?: No Dizziness?: No  Psychologic Depression?: No Anxiety?: No  Physical Exam: There were no vitals taken for this visit.  Constitutional:  Alert and oriented, No acute distress. HEENT: [place] AT, moist mucus membranes.  Trachea midline, no masses. Cardiovascular: No clubbing, cyanosis, or edema. Respiratory: Normal respiratory effort, no increased work of breathing. GI: Abdomen is soft, nontender, nondistended, no  abdominal masses GU: No CVA tenderness.  DRE: 50 g prostate, smooth, symmetriodules Skin: No rashes, bruises or suspicious lesions. Lymph: No cervical or inguinal adenopathy. Neurologic: Grossly intact, no focal deficits, moving all 4 extremities. Psychiatric: Normal mood and affect.  Laboratory Data: Lab Results  Component Value Date   WBC 6.3 07/20/2016   HGB 14.2 07/20/2016   HCT 43.2 07/20/2016   MCV 98.4 07/20/2016   PLT 154 07/20/2016    Lab Results  Component Value Date   CREATININE 1.24 (H) 07/20/2016    No results found for: PSA  No results found for: TESTOSTERONE  Lab Results  Component Value Date   HGBA1C 5.4 07/20/2016    Urinalysis    Component Value Date/Time   COLORURINE YELLOW (A) 11/21/2014 1415   APPEARANCEUR CLEAR (A) 11/21/2014 1415   APPEARANCEUR HAZY 07/18/2013 0931   LABSPEC 1.024 11/21/2014 1415   LABSPEC >=1.030 07/18/2013 0931   PHURINE 6.0 11/21/2014 1415   GLUCOSEU 50 (A) 11/21/2014 1415   GLUCOSEU NEGATIVE 07/18/2013 0931   HGBUR NEGATIVE 11/21/2014 1415   BILIRUBINUR NEGATIVE 11/21/2014 1415   BILIRUBINUR 1+ 07/18/2013 0931   KETONESUR NEGATIVE 11/21/2014 1415   PROTEINUR 30 (A) 11/21/2014 1415   NITRITE NEGATIVE 11/21/2014 1415   LEUKOCYTESUR NEGATIVE 11/21/2014 1415   LEUKOCYTESUR NEGATIVE 07/18/2013 0931    Pertinent Imaging: PVR: 84 mL's  Assessment & Plan:  82 year old male with an elevated PSA. We have little information and no prior PSAs to compare to. His digital rectal exam today is normal. Our plan is to repeat the patient's PSA in 6 weeks. At that point, we wi and determine whether it's necessary to proceed with prostate bio  1. BPH with obstruction/lower urinary tract symptoms Continue Terazosin - Urinalysis, Complete - Bladder Scan (Post Void Residual) in office  2. Elevated PSA, between 10 and less than 20 ng/ml Follow-up 6 weeks with a PSA prior. - Urinalysis, Complete - Bladder Scan (Post Void Residual) in  office   Return in about 6 weeks (around 09/23/2016) for with PSA prior.  [name], [facility] [address] [name], [place] [address] [phone]

## 2016-10-01 ENCOUNTER — Other Ambulatory Visit: Payer: Medicare Other

## 2016-10-01 DIAGNOSIS — R972 Elevated prostate specific antigen [PSA]: Secondary | ICD-10-CM

## 2016-10-02 LAB — PLEASE NOTE

## 2016-10-02 LAB — PSA: PROSTATE SPECIFIC AG, SERUM: 10.6 ng/mL — AB (ref 0.0–4.0)

## 2016-10-06 ENCOUNTER — Telehealth: Payer: Self-pay

## 2016-10-06 ENCOUNTER — Ambulatory Visit (INDEPENDENT_AMBULATORY_CARE_PROVIDER_SITE_OTHER): Payer: Medicare Other | Admitting: Urology

## 2016-10-06 ENCOUNTER — Encounter: Payer: Self-pay | Admitting: Urology

## 2016-10-06 VITALS — BP 151/68 | HR 76 | Ht 71.0 in | Wt 227.2 lb

## 2016-10-06 DIAGNOSIS — N138 Other obstructive and reflux uropathy: Secondary | ICD-10-CM

## 2016-10-06 DIAGNOSIS — N401 Enlarged prostate with lower urinary tract symptoms: Secondary | ICD-10-CM

## 2016-10-06 DIAGNOSIS — R972 Elevated prostate specific antigen [PSA]: Secondary | ICD-10-CM | POA: Diagnosis not present

## 2016-10-06 LAB — URINALYSIS, COMPLETE
Bilirubin, UA: NEGATIVE
KETONES UA: NEGATIVE
Leukocytes, UA: NEGATIVE
NITRITE UA: NEGATIVE
RBC, UA: NEGATIVE
Specific Gravity, UA: >1.03 — ABNORMAL HIGH (ref 1.005–1.030)
UUROB: 0.2 mg/dL (ref 0.2–1.0)
pH, UA: 5.5 (ref 5.0–7.5)

## 2016-10-06 LAB — MICROSCOPIC EXAMINATION: RBC, UA: NONE SEEN /hpf (ref 0–?)

## 2016-10-06 LAB — BLADDER SCAN AMB NON-IMAGING: SCAN RESULT: 0

## 2016-10-06 NOTE — Progress Notes (Signed)
10/06/2016 5:36 PM   [name] 20-Jun-1934 [id number]  Referring provider: [name], DO [address] [place], [place] [address]  Chief Complaint  Patient presents with  . Benign Prostatic Hypertrophy    HPI: 82 year old male presents to urology today for further evaluation and management of an elevated PSA.  The patient was noted to have an elevated PSA as part of her routine screening. The patient denies any progression in his urinary tract symptoms. He does have some urinary urgency and associated urge incontinence. He is not sure whether he empties his bladder not, he gets up between 1 in 3 times per night. He denies any hematuria. He denies any dysuria. He has no history of urinary tract infections or prostatitis.  The patient has no significant family history of prostate cancer.  PSA history:  9/12: 2.34 4/18: 11.8   6/14: 10.6   Interval: The patient's urinary tract symptoms have been stable over the past 3 months. He denies any significant hematuria or dysuria. He denies any bone or back pain.  PMH: Past Medical History:  Diagnosis Date  . Bowel obstruction ([place])   . BPH (benign prostatic hypertrophy)   . Gastric outlet obstruction   . High cholesterol   . Hyperchloremia   . Hypertension   . Pancreatitis     Surgical History: Past Surgical History:  Procedure Laterality Date  . ESOPHAGOGASTRODUODENOSCOPY N/A 12/03/2014   Procedure: ESOPHAGOGASTRODUODENOSCOPY (EGD);  Surgeon: Hulen Luster, MD;  Location: [facility] ENDOSCOPY;  Service: Endoscopy;  Laterality: N/A;    Home Medications:  Allergies as of 10/06/2016   No Known Allergies     Medication List       Accurate as of 10/06/16  5:36 PM. Always use your most recent med list.          Apoaequorin 20 MG Caps Commonly known as:  PREVAGEN EXTRA STRENGTH Take 20 mg by mouth daily.   aspirin EC 81 MG tablet Take 81 mg by mouth daily.   fluticasone 50 MCG/ACT nasal spray Commonly known as:   FLONASE Place 2 sprays into both nostrils daily.   rosuvastatin 20 MG tablet Commonly known as:  CRESTOR   simvastatin 20 MG tablet Commonly known as:  ZOCOR   terazosin 10 MG capsule Commonly known as:  HYTRIN Take 1 capsule (10 mg total) by mouth at bedtime.       Allergies: No Known Allergies  Family History: Family History  Problem Relation Age of Onset  . Heart disease Mother   . Heart disease Father   . Heart disease Brother     Social History:  reports that he has quit smoking. He has never used smokeless tobacco. He reports that he does not drink alcohol or use drugs.  ROS: UROLOGY Frequent Urination?: No Hard to postpone urination?: No Burning/pain with urination?: No Get up at night to urinate?: No Leakage of urine?: No Urine stream starts and stops?: No Trouble starting stream?: No Do you have to strain to urinate?: No Blood in urine?: No Urinary tract infection?: No Sexually transmitted disease?: No Injury to kidneys or bladder?: No Painful intercourse?: No Weak stream?: No Erection problems?: No Penile pain?: No  Gastrointestinal Nausea?: No Vomiting?: No Indigestion/heartburn?: No Diarrhea?: No Constipation?: No  Constitutional Fever: No Night sweats?: No Weight loss?: No Fatigue?: No  Skin Skin rash/lesions?: No Itching?: No  Eyes Blurred vision?: No Double vision?: No  Ears/Nose/Throat Sore throat?: No Sinus problems?: No  Hematologic/Lymphatic Swollen  glands?: No Easy bruising?: No  Cardiovascular Leg swelling?: No Chest pain?: No  Respiratory Cough?: No Shortness of breath?: No  Endocrine Excessive thirst?: No  Musculoskeletal Back pain?: No Joint pain?: No  Neurological Headaches?: No Dizziness?: No  Psychologic Depression?: No Anxiety?: No  Physical Exam: BP (!) 151/68 (BP Location: Left Arm, Patient Position: Sitting, Cuff Size: Normal)   Pulse 76   Ht 5\' 11"  (1.803 m)   Wt 103.1 kg (227 lb 3.2  oz)   BMI 31.69 kg/m   Constitutional:  Alert and oriented, No acute distress. HEENT: [place] AT, moist mucus membranes.  Trachea midline, no masses. Cardiovascular: No clubbing, cyanosis, or edema. Respiratory: Normal respiratory effort, no increased work of breathing. GI: Abdomen is soft, nontender, nondistended, no abdominal masses GU: No CVA tenderness.  DRE: 50 g prostate, smooth, symmetric no nodules Skin: No rashes, bruises or suspicious lesions. Lymph: No cervical or inguinal adenopathy. Neurologic: Grossly intact, no focal deficits, moving all 4 extremities. Psychiatric: Normal mood and affect.  Laboratory Data: Lab Results  Component Value Date   WBC 6.3 07/20/2016   HGB 14.2 07/20/2016   HCT 43.2 07/20/2016   MCV 98.4 07/20/2016   PLT 154 07/20/2016    Lab Results  Component Value Date   CREATININE 1.24 (H) 07/20/2016    No results found for: PSA  No results found for: TESTOSTERONE  Lab Results  Component Value Date   HGBA1C 5.4 07/20/2016    Urinalysis    Component Value Date/Time   COLORURINE YELLOW (A) 11/21/2014 1415   APPEARANCEUR Clear 10/06/2016 1534   LABSPEC 1.024 11/21/2014 1415   LABSPEC >=1.030 07/18/2013 0931   PHURINE 6.0 11/21/2014 1415   GLUCOSEU Trace (A) 10/06/2016 1534   GLUCOSEU NEGATIVE 07/18/2013 0931   HGBUR NEGATIVE 11/21/2014 1415   BILIRUBINUR Negative 10/06/2016 1534   BILIRUBINUR 1+ 07/18/2013 0931   KETONESUR NEGATIVE 11/21/2014 1415   PROTEINUR 1+ (A) 10/06/2016 1534   PROTEINUR 30 (A) 11/21/2014 1415   NITRITE Negative 10/06/2016 1534   NITRITE NEGATIVE 11/21/2014 1415   LEUKOCYTESUR Negative 10/06/2016 1534   LEUKOCYTESUR NEGATIVE 07/18/2013 0931    Pertinent Imaging: PVR: 84 mL's  Assessment & Plan:  82 year old male with an elevated PSA.  Unfortunately, as PSA remains elevated, but it is stable. We discussed the next step, which would likely be a prostate biopsy. However, given that it did go down over the past 2  months we felt that it was reasonable that we could repeat the patient's PSA within the next 6 months prior to making any aggressive decisions.  1. BPH with obstruction/lower urinary tract symptoms Continue Terazosin - Urinalysis, Complete - Bladder Scan (Post Void Residual) in office  2. Elevated PSA, between 10 and less than 20 ng/ml Follow-up 6 months with a PSA prior. - Urinalysis, Complete - Bladder Scan (Post Void Residual) in office   Return in about 6 months (around 04/07/2017).  Ardis Hughs, [facility] [address] [place],  [address] [phone]

## 2016-10-06 NOTE — Telephone Encounter (Signed)
Unable to reach patient could not leave vmail no mailbox set up

## 2016-10-06 NOTE — Telephone Encounter (Signed)
-----   Message from Ardis Hughs, MD sent at 10/06/2016 12:33 PM EDT ----- Please let the patient know that his PSA remains elevated, but it is stable.

## 2017-01-05 ENCOUNTER — Other Ambulatory Visit: Payer: Self-pay | Admitting: Family Medicine

## 2017-01-05 DIAGNOSIS — N401 Enlarged prostate with lower urinary tract symptoms: Principal | ICD-10-CM

## 2017-01-05 DIAGNOSIS — N138 Other obstructive and reflux uropathy: Secondary | ICD-10-CM

## 2017-02-01 DIAGNOSIS — Z23 Encounter for immunization: Secondary | ICD-10-CM | POA: Diagnosis not present

## 2017-04-06 ENCOUNTER — Ambulatory Visit: Payer: Medicare Other

## 2017-04-09 ENCOUNTER — Ambulatory Visit (INDEPENDENT_AMBULATORY_CARE_PROVIDER_SITE_OTHER): Payer: Medicare Other | Admitting: Urology

## 2017-04-09 ENCOUNTER — Encounter: Payer: Self-pay | Admitting: Urology

## 2017-04-09 VITALS — BP 152/74 | HR 81 | Ht 71.0 in | Wt 231.9 lb

## 2017-04-09 DIAGNOSIS — N400 Enlarged prostate without lower urinary tract symptoms: Secondary | ICD-10-CM

## 2017-04-09 DIAGNOSIS — R972 Elevated prostate specific antigen [PSA]: Secondary | ICD-10-CM | POA: Diagnosis not present

## 2017-04-09 NOTE — Progress Notes (Signed)
04/09/2017 9:18 AM   [name] 01/09/1935 [id number]  Referring provider: [name], DO [address] [place], [place] [address]  Chief Complaint  Patient presents with  . Benign Prostatic Hypertrophy    HPI: 82 year old male presents to urology today for further evaluation and management of an elevated PSA.  The patient was noted to have an elevated PSA as part of her routine screening. The patient has no significant family history of prostate cancer.  At his last appointment, it was being followed for stability in lieu of a biopsy.  PSA history:  9/12: 2.34 4/18: 11.8   6/18: 10.6  12/18: pending today  He is also on terazosin for BPH.  He has no complaints at this time.  He is unsure as to why he is following up today.  He did not have his PSA drawn prior to his appointment as scheduled.    PMH: Past Medical History:  Diagnosis Date  . Bowel obstruction ([place])   . BPH (benign prostatic hypertrophy)   . Gastric outlet obstruction   . High cholesterol   . Hyperchloremia   . Hypertension   . Pancreatitis     Surgical History: Past Surgical History:  Procedure Laterality Date  . ESOPHAGOGASTRODUODENOSCOPY N/A 12/03/2014   Procedure: ESOPHAGOGASTRODUODENOSCOPY (EGD);  Surgeon: Hulen Luster, MD;  Location: [facility] ENDOSCOPY;  Service: Endoscopy;  Laterality: N/A;    Home Medications:  Allergies as of 04/09/2017   No Known Allergies     Medication List        Accurate as of 04/09/17  9:18 AM. Always use your most recent med list.          Apoaequorin 20 MG Caps Commonly known as:  PREVAGEN EXTRA STRENGTH Take 20 mg by mouth daily.   aspirin EC 81 MG tablet Take 81 mg by mouth daily.   fluticasone 50 MCG/ACT nasal spray Commonly known as:  FLONASE Place 2 sprays into both nostrils daily.   simvastatin 20 MG tablet Commonly known as:  ZOCOR   terazosin 10 MG capsule Commonly known as:  HYTRIN TAKE 1 CAPSULE AT BEDTIME       Allergies:  No Known Allergies  Family History: Family History  Problem Relation Age of Onset  . Heart disease Mother   . Heart disease Father   . Heart disease Brother     Social History:  reports that he has quit smoking. he has never used smokeless tobacco. He reports that he does not drink alcohol or use drugs.  ROS: UROLOGY Frequent Urination?: Yes Hard to postpone urination?: No Burning/pain with urination?: No Get up at night to urinate?: Yes Leakage of urine?: Yes Urine stream starts and stops?: Yes Trouble starting stream?: Yes Do you have to strain to urinate?: No Blood in urine?: No Urinary tract infection?: No Sexually transmitted disease?: No Injury to kidneys or bladder?: No Painful intercourse?: No Weak stream?: No Erection problems?: No Penile pain?: No  Gastrointestinal Nausea?: No Vomiting?: No Indigestion/heartburn?: No Diarrhea?: No Constipation?: No  Constitutional Fever: No Night sweats?: No Weight loss?: Yes Fatigue?: No  Skin Skin rash/lesions?: No Itching?: No  Eyes Blurred vision?: No Double vision?: No  Ears/Nose/Throat Sore throat?: No Sinus problems?: No  Hematologic/Lymphatic Swollen glands?: No Easy bruising?: No  Cardiovascular Leg swelling?: No Chest pain?: No  Respiratory Cough?: No Shortness of breath?: No  Endocrine Excessive thirst?: No  Musculoskeletal Back pain?: No Joint pain?: No  Neurological Headaches?: No Dizziness?: No  Psychologic Depression?: No Anxiety?: No  Physical Exam: BP (!) 152/74 (BP Location: Right Arm, Patient Position: Sitting, Cuff Size: Normal)   Pulse 81   Ht 5\' 11"  (1.803 m)   Wt 231 lb 14.4 oz (105.2 kg)   BMI 32.34 kg/m   Constitutional:  Alert and oriented, No acute distress. HEENT: [place] AT, moist mucus membranes.  Trachea midline, no masses. Cardiovascular: No clubbing, cyanosis, or edema. Respiratory: Normal respiratory effort, no increased work of breathing. GI: Abdomen  is soft, nontender, nondistended, no abdominal masses GU: No CVA tenderness.  Skin: No rashes, bruises or suspicious lesions. Lymph: No cervical or inguinal adenopathy. Neurologic: Grossly intact, no focal deficits, moving all 4 extremities. Psychiatric: Normal mood and affect.  Laboratory Data: Lab Results  Component Value Date   WBC 6.3 07/20/2016   HGB 14.2 07/20/2016   HCT 43.2 07/20/2016   MCV 98.4 07/20/2016   PLT 154 07/20/2016    Lab Results  Component Value Date   CREATININE 1.24 (H) 07/20/2016    No results found for: PSA  No results found for: TESTOSTERONE  Lab Results  Component Value Date   HGBA1C 5.4 07/20/2016    Urinalysis    Component Value Date/Time   COLORURINE YELLOW (A) 11/21/2014 1415   APPEARANCEUR Clear 10/06/2016 1534   LABSPEC 1.024 11/21/2014 1415   LABSPEC >=1.030 07/18/2013 0931   PHURINE 6.0 11/21/2014 1415   GLUCOSEU Trace (A) 10/06/2016 1534   GLUCOSEU NEGATIVE 07/18/2013 0931   HGBUR NEGATIVE 11/21/2014 1415   BILIRUBINUR Negative 10/06/2016 1534   BILIRUBINUR 1+ 07/18/2013 0931   KETONESUR NEGATIVE 11/21/2014 1415   PROTEINUR 1+ (A) 10/06/2016 1534   PROTEINUR 30 (A) 11/21/2014 1415   NITRITE Negative 10/06/2016 1534   NITRITE NEGATIVE 11/21/2014 1415   LEUKOCYTESUR Negative 10/06/2016 1534   LEUKOCYTESUR NEGATIVE 07/18/2013 0931    Assessment & Plan:    1.  Elevated PSA Discussed the patient a PSA of approximately 10 is relatively normal for somewhere around 82.  I suggest that we ensure stability, so we will repeat his PSA today.  He will follow-up in 6 months for repeat PSA assuming this is stable. If it tends to be stable over the next year or so, we can probably stop PSA screening at that time.  2.  BPH Continue terazosin  Return in about 6 months (around 10/08/2017) for PSA prior.  Nickie Retort, MD  [facility] [address] [place], [place] [address] [phone]

## 2017-04-10 LAB — PSA: PROSTATE SPECIFIC AG, SERUM: 11.1 ng/mL — AB (ref 0.0–4.0)

## 2017-04-15 ENCOUNTER — Telehealth: Payer: Self-pay

## 2017-04-15 NOTE — Telephone Encounter (Signed)
Nickie Retort, MD  Lestine Box, LPN        Please let patient know PSA is stable. F/u as scheduled. Thanks.    Spoke with pt in reference to PSA results and f/u. Pt voiced understanding.

## 2017-05-03 ENCOUNTER — Encounter: Payer: Self-pay | Admitting: Family Medicine

## 2017-05-03 ENCOUNTER — Ambulatory Visit (INDEPENDENT_AMBULATORY_CARE_PROVIDER_SITE_OTHER): Payer: Medicare Other | Admitting: Family Medicine

## 2017-05-03 ENCOUNTER — Other Ambulatory Visit: Payer: Self-pay | Admitting: Family Medicine

## 2017-05-03 VITALS — BP 155/66 | HR 79 | Temp 98.2°F | Resp 16 | Ht 71.0 in | Wt 240.0 lb

## 2017-05-03 DIAGNOSIS — N138 Other obstructive and reflux uropathy: Secondary | ICD-10-CM

## 2017-05-03 DIAGNOSIS — I10 Essential (primary) hypertension: Secondary | ICD-10-CM

## 2017-05-03 DIAGNOSIS — B351 Tinea unguium: Secondary | ICD-10-CM

## 2017-05-03 DIAGNOSIS — E782 Mixed hyperlipidemia: Secondary | ICD-10-CM

## 2017-05-03 DIAGNOSIS — R7309 Other abnormal glucose: Secondary | ICD-10-CM

## 2017-05-03 DIAGNOSIS — S90222A Contusion of left lesser toe(s) with damage to nail, initial encounter: Secondary | ICD-10-CM | POA: Diagnosis not present

## 2017-05-03 DIAGNOSIS — R413 Other amnesia: Secondary | ICD-10-CM

## 2017-05-03 DIAGNOSIS — N401 Enlarged prostate with lower urinary tract symptoms: Secondary | ICD-10-CM

## 2017-05-03 DIAGNOSIS — R972 Elevated prostate specific antigen [PSA]: Secondary | ICD-10-CM

## 2017-05-03 NOTE — Progress Notes (Signed)
Subjective:    Patient ID: [name], male    DOB: 03/24/1935, 83 y.o.   MRN: [id number]  [name] is a 83 y.o. male presenting on 05/03/2017 for Toe Pain (discoloration noticed black toenail)  Patient presents for a same day appointment.  HPI   DISCOLORATION OF TOENAILS Reports symptoms started suddenly about first week of December, about 6 weeks ago, noticed both great toenails "dark or black in color" entire toenail was dark at once, it was not a gradual change. He reports that there was no injury, no stubbed toe or dropped anything, no fall.  - No prior episodes of similar nail discoloration before. Never established with Podiatry. 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 Smoking status: Former Research scientist (life sciences)  . Smokeless tobacco: Never Used  Substance Use Topics  . Alcohol use: No  . Drug use: No    Review of Systems Per HPI unless specifically indicated above     Objective:    BP (!) 155/66   Pulse 79   Temp 98.2 F (36.8 C) (Oral)   Resp 16   Ht 5\' 11"  (1.803 m)   Wt 240 lb (108.9 kg)   BMI 33.47 kg/m   Wt Readings from Last 3 Encounters:  05/03/17 240 lb (108.9 kg)  04/09/17 231 lb 14.4 oz (105.2 kg)  10/06/16 227 lb 3.2 oz (103.1 kg)    Physical Exam  Constitutional: He is oriented to person, place, and time. He appears well-developed and  well-nourished. No distress.  Well-appearing, comfortable, cooperative  HENT:  Head: Normocephalic and atraumatic.  Mouth/Throat: Oropharynx is clear and moist.  Eyes: Conjunctivae are normal. Right eye exhibits no discharge. Left eye exhibits no discharge.  Cardiovascular: Normal rate and intact distal pulses.  Pulmonary/Chest: Effort normal.  Musculoskeletal: He exhibits no edema (trace bilateral lower extremity, minimal).  Neurological: He is alert and oriented to person, place, and time.  Skin: Skin is warm and dry. No rash noted. He is not diaphoretic. No erythema.  Mild dry skin on lower legs and ankles  Bilateral great toenails Left > Right with darker discoloration, thicker toenail appearance great toes and 2nd toes, most consistent with a subungual hematoma or bleed under toenail, rest of toe appears healthy, no erythema, non tender anywhere either toenail or toe. Full ROM active, no numbness tingling, edema, drainage.  Psychiatric: His behavior is normal.  Well groomed, good eye contact, normal speech and thoughts  Nursing note and vitals reviewed.        Results for orders placed or performed in visit on 04/09/17  PSA  Result Value Ref Range   Prostate Specific Ag, Serum 11.1 (H) 0.0 - 4.0 ng/mL      Assessment & Plan:   Problem List Items Addressed This Visit    None  Visit Diagnoses    Subungual hematoma of toenail, left, initial encounter    -  Primary   Relevant Orders   Ambulatory referral to Podiatry   Onychomycosis of toenail       Relevant Orders   Ambulatory referral to Podiatry      Uncertain exact etiology but seems most consistent with a subungual hematoma or bleed, more persistent chronic >6 weeks now without worse or improvement. No other associated symptoms or complications, except toenail thickening and appearance concerning for onychomycosis. - Distal pulses are intact, and normal skin of toe, seem less likely to be vascular related, unlikely  by exam or history to be arterial insufficiency or gangrene - On Aspirin may be contributing to bleeding, but no other bleeding history or risk  Plan: 1. Reassurance today, no sign of bacterial infection or arterial insufficiency 2. Referral to [facility] Pod[facility] for further eval and management - may need treatment onychomycosis, or procedure if indicated - Return criteria given - in future could consider ABI if needed  No orders of the defined types were placed in this encounter.  Orders Placed This Encounter  Procedures  . Ambulatory referral to Podiatry    Referral Priority:   Routine    Referral Type:   Consultation    Referral Reason:   Specialty Services Required    Requested Specialty:   Podiatry    Number of Visits Requested:   1     Follow up plan: Return in about 6 months (around 10/31/2017) for Annual Physical.  Future labs ordered for 08/2017  Nobie Putnam, [facility] 05/03/2017, 12:07 PM

## 2017-05-03 NOTE — Patient Instructions (Addendum)
Thank you for coming to the office today.   1.  I do not have an exact cause of your dark toenail discoloration, but from my exam today, it looks like bleeding under toenails, this can be old blood and does not seem to be getting worse.  You do also appear to have some thickening toenails with likely fungal infection too.  [place] Address: [address] [place], [place] [address] Hours: Open 8AM-5PM Phone: [phone]  If you get new symptoms redness swelling, pain in foot or lower leg, or discoloration of toe or foot then we can consider an ultrasound or checking blood flow, but again I do not think this is problem, if got worse then may need to go to Hospital ED  DUE for FASTING BLOOD WORK (no food or drink after midnight before the lab appointment, only water or coffee without cream/sugar on the morning of)  SCHEDULE "Lab Only" visit in the morning at the clinic for lab draw in 3-6 MONTHS   - Make sure Lab Only appointment is at about 1 week before your next appointment, so that results will be available  For Lab Results, once available within 2-3 days of blood draw, you can can log in to MyChart online to view your results and a brief explanation. Also, we can discuss results at next follow-up visit.   Please schedule a Follow-up Appointment to: Return in about 6 months (around 10/31/2017) for Annual Physical.  If you have any other questions or concerns, please feel free to call the office or send a message through [place]. You may also schedule an earlier appointment if necessary.  Additionally, you may be receiving a survey about your experience at our office within a few days to 1 week by e-mail or mail. We value your feedback.  Nobie Putnam, DO [place]

## 2017-05-04 ENCOUNTER — Telehealth: Payer: Self-pay | Admitting: Family Medicine

## 2017-05-04 NOTE — Telephone Encounter (Signed)
Called pt to sched for AWV with Nurse Health Advisor. sched w/labs . C/b #  [phone] on Skype [email or web address] if you have questions

## 2017-05-27 ENCOUNTER — Encounter: Payer: Self-pay | Admitting: Podiatry

## 2017-05-27 ENCOUNTER — Ambulatory Visit (INDEPENDENT_AMBULATORY_CARE_PROVIDER_SITE_OTHER): Payer: Medicare Other | Admitting: Podiatry

## 2017-05-27 DIAGNOSIS — T148XXA Other injury of unspecified body region, initial encounter: Secondary | ICD-10-CM

## 2017-05-27 NOTE — Progress Notes (Signed)
   Subjective:    Patient ID: [name], male    DOB: [birth year], 83 y.o.   MRN: [id number]  HPIthis patient presents the office per recommendation of his medical doctor.  He says both great toenails have become blackened and dark over the last 2 months.  He says he has no history of trauma or injury to the foot.  He denies any pain or drainage from under the toenail on both great toes.  He presents the office today for an evaluation and treatment of this discoloration under his big toe nails.    Review of Systems  All other systems reviewed and are negative.      Objective:   Physical Exam General Appearance  Alert, conversant and in no acute stress.  Vascular  Dorsalis pedis and posterior pulses are palpable  bilaterally.  Capillary return is within normal limits  bilaterally. Temperature is within normal limits  Bilaterally.  Neurologic  Senn-Weinstein monofilament wire test within normal limits  bilaterally. Muscle power within normal limits bilaterally.  Nails Thick disfigured discolored nails with subungual debris hallux  B/L.  Blackened hematoma noted subungually with his left greater than his right toenail.  Orthopedic  No limitations of motion of motion feet bilaterally.  No crepitus or effusions noted.  No bony pathology or digital deformities noted.  Skin  normotropic skin with no porokeratosis noted bilaterally.  No signs of infections or ulcers noted.          Assessment & Plan:  Subungual hematoma hallux  B/L  IE  Discussed the blackened toenails with this patient.  Told him he developed a hematoma under his great toenails.  I told him that since there is no pain or discomfort associated with the nails. There is no need for treatment.  He was told if the nail starts draining he should make an appointment to be seen here at the office.  I also told him that this nail possibly may self of all scar but if there is no pain or discomfort a new nail will regrow.  RTC  prn   Gardiner Barefoot DPM

## 2017-08-31 ENCOUNTER — Ambulatory Visit (INDEPENDENT_AMBULATORY_CARE_PROVIDER_SITE_OTHER): Payer: Medicare Other

## 2017-08-31 ENCOUNTER — Other Ambulatory Visit: Payer: Medicare Other

## 2017-08-31 VITALS — BP 152/88 | HR 70 | Temp 97.8°F | Resp 16 | Ht 71.0 in | Wt 232.4 lb

## 2017-08-31 DIAGNOSIS — N138 Other obstructive and reflux uropathy: Secondary | ICD-10-CM | POA: Diagnosis not present

## 2017-08-31 DIAGNOSIS — Z23 Encounter for immunization: Secondary | ICD-10-CM

## 2017-08-31 DIAGNOSIS — R7309 Other abnormal glucose: Secondary | ICD-10-CM | POA: Diagnosis not present

## 2017-08-31 DIAGNOSIS — I10 Essential (primary) hypertension: Secondary | ICD-10-CM | POA: Diagnosis not present

## 2017-08-31 DIAGNOSIS — E782 Mixed hyperlipidemia: Secondary | ICD-10-CM

## 2017-08-31 DIAGNOSIS — Z0000 Encounter for general adult medical examination without abnormal findings: Secondary | ICD-10-CM | POA: Diagnosis not present

## 2017-08-31 DIAGNOSIS — N401 Enlarged prostate with lower urinary tract symptoms: Secondary | ICD-10-CM

## 2017-08-31 NOTE — Progress Notes (Signed)
Subjective:   [name] is a 83 y.o. male who presents for Medicare Annual/Subsequent preventive examination.  Review of Systems:   Cardiac Risk Factors include: advanced age (>46men, >64 women);male gender;hypertension;dyslipidemia;obesity (BMI >30kg/m2)     Objective:    Vitals: BP (!) 152/88 (BP Location: Right Arm, Patient Position: Sitting)   Pulse 70   Temp 97.8 F (36.6 C) (Oral)   Resp 16   Ht 5\' 11"  (1.803 m)   Wt 232 lb 6.4 oz (105.4 kg)   BMI 32.41 kg/m   Body mass index is 32.41 kg/m.  Advanced Directives 08/31/2017 12/02/2014 11/21/2014  Does Patient Have a Medical Advance Directive? No No No  Would patient like information on creating a medical advance directive? No - Patient declined No - patient declined information -    Tobacco Social History   Tobacco Use  Smoking Status Former Smoker  Smokeless Tobacco Never Used     Counseling given: Not Answered   Clinical Intake:  Pre-visit preparation completed: Yes  Pain : No/denies pain     Nutritional Status: BMI > 30  Obese Nutritional Risks: None Diabetes: No  How often do you need to have someone help you when you read instructions, pamphlets, or other written materials from your doctor or pharmacy?: 1 - Never What is the last grade level you completed in school?: 2 years college   Interpreter Needed?: No  Information entered by :: Tiffany Hill,LPN   Past Medical History:  Diagnosis Date  . Bowel obstruction ([place])   . BPH (benign prostatic hypertrophy)   . Gastric outlet obstruction   . High cholesterol   . Hyperchloremia   . Hypertension   . Pancreatitis    Past Surgical History:  Procedure Laterality Date  . ESOPHAGOGASTRODUODENOSCOPY N/A 12/03/2014   Procedure: ESOPHAGOGASTRODUODENOSCOPY (EGD);  Surgeon: Hulen Luster, MD;  Location: [facility] ENDOSCOPY;  Service: Endoscopy;  Laterality: N/A;   Family History  Problem Relation Age of Onset  . Heart disease Mother   . Heart disease  Father   . Heart disease Brother    Social History   Socioeconomic History  . Marital status: Married    Spouse name: Not on file  . Number of children: Not on file  . Years of education: Not on file  . Highest education level: Not on file  Occupational History  . Not on file  Social Needs  . Financial resource strain: Not hard at all  . Food insecurity:    Worry: Never true    Inability: Never true  . Transportation needs:    Medical: No    Non-medical: No  Tobacco Use  . Smoking status: Former Research scientist (life sciences)  . Smokeless tobacco: Never Used  Substance and Sexual Activity  . Alcohol use: No  . Drug use: No  . Sexual activity: Never  Lifestyle  . Physical activity:    Days per week: 0 days    Minutes per session: 0 min  . Stress: Not at all  Relationships  . Social connections:    Talks on phone: Once a week    Gets together: More than three times a week    Attends religious service: More than 4 times per year    Active member of club or organization: No    Attends meetings of clubs or organizations: Never    Relationship status: Married  Other Topics Concern  . Not on file  Social History Narrative  . Not on file  Outpatient Encounter Medications as of 08/31/2017  Medication Sig  . terazosin (HYTRIN) 10 MG capsule TAKE 1 CAPSULE AT BEDTIME  . Apoaequorin (PREVAGEN EXTRA STRENGTH) 20 MG CAPS Take 20 mg by mouth daily. (Patient not taking: Reported on 08/12/2016)  . aspirin EC 81 MG tablet Take 81 mg by mouth daily.  . fluticasone (FLONASE) 50 MCG/ACT nasal spray Place 2 sprays into both nostrils daily. (Patient not taking: Reported on 08/31/2017)  . simvastatin (ZOCOR) 20 MG tablet    No facility-administered encounter medications on file as of 08/31/2017.     Activities of Daily Living In your present state of health, do you have any difficulty performing the following activities: 08/31/2017  Hearing? N  Vision? N  Difficulty concentrating or making decisions? N    Walking or climbing stairs? N  Dressing or bathing? N  Doing errands, shopping? N  Preparing Food and eating ? N  Using the Toilet? N  In the past six months, have you accidently leaked urine? N  Do you have problems with loss of bowel control? N  Managing your Medications? N  Managing your Finances? N  Housekeeping or managing your Housekeeping? N  Some recent data might be hidden    Patient Care Team: [name], DO as PCP - General (Family Medicine)   Assessment:   This is a routine wellness examination for [place].  Exercise Activities and Dietary recommendations Current Exercise Habits: The patient does not participate in regular exercise at present, Exercise limited by: None identified  Goals    . DIET - INCREASE WATER INTAKE     Recommend drinking at least 6-8 glasses of water a day        Fall Risk Fall Risk  08/31/2017 05/03/2017 04/10/2016 12/13/2015  Falls in the past year? Yes No No No  Comment - - - [place]i Telephone Survey: data to providers prior to load  Number falls in past yr: 1 - - -  Injury with Fall? No - - -   Is the patient's home free of loose throw rugs in walkways, pet beds, electrical cords, etc?   yes      Grab bars in the bathroom? no      Handrails on the stairs?   no stairs       Adequate lighting?   yes Timed Get Up and Go Performed: Completed in 9  seconds with no use of assistive devices, steady gait. No intervention needed at this time.   Depression Screen PHQ 2/9 Scores 08/31/2017 05/03/2017 04/10/2016  PHQ - 2 Score 0 0 0    Cognitive Function     6CIT Screen 08/31/2017  What Year? 0 points  What month? 0 points  What time? 0 points  Count back from 20 0 points  Months in reverse 0 points  Repeat phrase 2 points  Total Score 2    Immunization History  Administered Date(s) Administered  . Influenza-Unspecified 12/20/2014  . Pneumococcal Polysaccharide-23 08/31/2017    Qualifies for Shingles Vaccine? Yes,  discussed shingrix vaccine   Screening Tests Health Maintenance  Topic Date Due  . INFLUENZA VACCINE  11/18/2017  . TETANUS/TDAP  01/07/2023  . PNA vac Low Risk Adult  Completed   Cancer Screenings: Lung: Low Dose CT Chest recommended if Age 63-80 years, 30 pack-year currently smoking OR have quit w/in 15years. Patient does not qualify. Colorectal: no longer required  Additional Screenings:  Hepatitis C Screening: not indicated       Plan:  I have personally reviewed and addressed the Medicare Annual Wellness questionnaire and have noted the following in the patient's chart:  A. Medical and social history B. Use of alcohol, tobacco or illicit drugs  C. Current medications and supplements D. Functional ability and status E.  Nutritional status F.  Physical activity G. Advance directives H. List of other physicians I.  Hospitalizations, surgeries, and ER visits in previous 12 months J.  [place] such as hearing and vision if needed, cognitive and depression L. Referrals and appointments   In addition, I have reviewed and discussed with patient certain preventive protocols, quality metrics, and best practice recommendations. A written personalized care plan for preventive services as well as general preventive health recommendations were provided to patient.   Signed,  Tyler Aas, LPN Nurse Health Advisor   Nurse Notes:none

## 2017-08-31 NOTE — Patient Instructions (Addendum)
[name] , Thank you for taking time to come for your Medicare Wellness Visit. I appreciate your ongoing commitment to your health goals. Please review the following plan we discussed and let me know if I can assist you in the future.   Screening recommendations/referrals: Colonoscopy: no longer required  Recommended yearly ophthalmology/optometry visit for glaucoma screening and checkup Recommended yearly dental visit for hygiene and checkup  Vaccinations: Influenza vaccine: up to date, due 12/2017 Pneumococcal vaccine: pneumovax 23 done today, completed series  Tdap vaccine: up to date Shingles vaccine: shingrix eligible, check with your insurance company for coverage information     Advanced directives: Advance directive discussed with you today. Even though you declined this today please call our office should you change your mind and we can give you the proper paperwork for you to fill out.  Conditions/risks identified: Recommend drinking at least 6-8 glasses of water a day   Next appointment: Follow up on 09/08/2017 at 9:00am with Clark. Follow up in one year for your annual wellness exam.   Preventive Care 81 Years and Older, Male Preventive care refers to lifestyle choices and visits with your health care provider that can promote health and wellness. What does preventive care include?  A yearly physical exam. This is also called an annual well check.  Dental exams once or twice a year.  Routine eye exams. Ask your health care provider how often you should have your eyes checked.  Personal lifestyle choices, including:  Daily care of your teeth and gums.  Regular physical activity.  Eating a healthy diet.  Avoiding tobacco and drug use.  Limiting alcohol use.  Practicing safe sex.  Taking low doses of aspirin every day.  Taking vitamin and mineral supplements as recommended by your health care provider. What happens during an annual well check? The  services and screenings done by your health care provider during your annual well check will depend on your age, overall health, lifestyle risk factors, and family history of disease. Counseling  Your health care provider may ask you questions about your:  Alcohol use.  Tobacco use.  Drug use.  Emotional well-being.  Home and relationship well-being.  Sexual activity.  Eating habits.  History of falls.  Memory and ability to understand (cognition).  Work and work Statistician. Screening  You may have the following tests or measurements:  Height, weight, and BMI.  Blood pressure.  Lipid and cholesterol levels. These may be checked every 5 years, or more frequently if you are over 32 years old.  Skin check.  Lung cancer screening. You may have this screening every year starting at age 81 if you have a 30-pack-year history of smoking and currently smoke or have quit within the past 15 years.  Fecal occult blood test (FOBT) of the stool. You may have this test every year starting at age 81.  Flexible sigmoidoscopy or colonoscopy. You may have a sigmoidoscopy every 5 years or a colonoscopy every 10 years starting at age 81.  Prostate cancer screening. Recommendations will vary depending on your family history and other risks.  Hepatitis C blood test.  Hepatitis B blood test.  Sexually transmitted disease (STD) testing.  Diabetes screening. This is done by checking your blood sugar (glucose) after you have not eaten for a while (fasting). You may have this done every 1-3 years.  Abdominal aortic aneurysm (AAA) screening. You may need this if you are a current or former smoker.  Osteoporosis. You may be screened  starting at age 81 if you are at high risk. Talk with your health care provider about your test results, treatment options, and if necessary, the need for more tests. Vaccines  Your health care provider may recommend certain vaccines, such as:  Influenza  vaccine. This is recommended every year.  Tetanus, diphtheria, and acellular pertussis (Tdap, Td) vaccine. You may need a Td booster every 10 years.  Zoster vaccine. You may need this after age 81.  Pneumococcal 13-valent conjugate (PCV13) vaccine. One dose is recommended after age 81.  Pneumococcal polysaccharide (PPSV23) vaccine. One dose is recommended after age 81. Talk to your health care provider about which screenings and vaccines you need and how often you need them. This information is not intended to replace advice given to you by your health care provider. Make sure you discuss any questions you have with your health care provider. Document Released: 05/03/2015 Document Revised: 12/25/2015 Document Reviewed: 02/05/2015 Elsevier Interactive Patient Education  2017 [place] Prevention in the Home Falls can cause injuries. They can happen to people of all ages. There are many things you can do to make your home safe and to help prevent falls. What can I do on the outside of my home?  Regularly fix the edges of walkways and driveways and fix any cracks.  Remove anything that might make you trip as you walk through a door, such as a raised step or threshold.  Trim any bushes or trees on the path to your home.  Use bright outdoor lighting.  Clear any walking paths of anything that might make someone trip, such as rocks or tools.  Regularly check to see if handrails are loose or broken. Make sure that both sides of any steps have handrails.  Any raised decks and porches should have guardrails on the edges.  Have any leaves, snow, or ice cleared regularly.  Use sand or salt on walking paths during winter.  Clean up any spills in your garage right away. This includes oil or grease spills. What can I do in the bathroom?  Use night lights.  Install grab bars by the toilet and in the tub and shower. Do not use towel bars as grab bars.  Use non-skid mats or decals  in the tub or shower.  If you need to sit down in the shower, use a plastic, non-slip stool.  Keep the floor dry. Clean up any water that spills on the floor as soon as it happens.  Remove soap buildup in the tub or shower regularly.  Attach bath mats securely with double-sided non-slip rug tape.  Do not have throw rugs and other things on the floor that can make you trip. What can I do in the bedroom?  Use night lights.  Make sure that you have a light by your bed that is easy to reach.  Do not use any sheets or blankets that are too big for your bed. They should not hang down onto the floor.  Have a firm chair that has side arms. You can use this for support while you get dressed.  Do not have throw rugs and other things on the floor that can make you trip. What can I do in the kitchen?  Clean up any spills right away.  Avoid walking on wet floors.  Keep items that you use a lot in easy-to-reach places.  If you need to reach something above you, use a strong step stool that has a grab  bar.  Keep electrical cords out of the way.  Do not use floor polish or wax that makes floors slippery. If you must use wax, use non-skid floor wax.  Do not have throw rugs and other things on the floor that can make you trip. What can I do with my stairs?  Do not leave any items on the stairs.  Make sure that there are handrails on both sides of the stairs and use them. Fix handrails that are broken or loose. Make sure that handrails are as long as the stairways.  Check any carpeting to make sure that it is firmly attached to the stairs. Fix any carpet that is loose or worn.  Avoid having throw rugs at the top or bottom of the stairs. If you do have throw rugs, attach them to the floor with carpet tape.  Make sure that you have a light switch at the top of the stairs and the bottom of the stairs. If you do not have them, ask someone to add them for you. What else can I do to help  prevent falls?  Wear shoes that:  Do not have high heels.  Have rubber bottoms.  Are comfortable and fit you well.  Are closed at the toe. Do not wear sandals.  If you use a stepladder:  Make sure that it is fully opened. Do not climb a closed stepladder.  Make sure that both sides of the stepladder are locked into place.  Ask someone to hold it for you, if possible.  Clearly mark and make sure that you can see:  Any grab bars or handrails.  First and last steps.  Where the edge of each step is.  Use tools that help you move around (mobility aids) if they are needed. These include:  Canes.  Walkers.  Scooters.  Crutches.  Turn on the lights when you go into a dark area. Replace any light bulbs as soon as they burn out.  Set up your furniture so you have a clear path. Avoid moving your furniture around.  If any of your floors are uneven, fix them.  If there are any pets around you, be aware of where they are.  Review your medicines with your doctor. Some medicines can make you feel dizzy. This can increase your chance of falling. Ask your doctor what other things that you can do to help prevent falls. This information is not intended to replace advice given to you by your health care provider. Make sure you discuss any questions you have with your health care provider. Document Released: 01/31/2009 Document Revised: 09/12/2015 Document Reviewed: 05/11/2014 Elsevier Interactive Patient Education  2017 [employer].

## 2017-09-01 ENCOUNTER — Encounter: Payer: Self-pay | Admitting: Family Medicine

## 2017-09-01 DIAGNOSIS — N183 Chronic kidney disease, stage 3 unspecified: Secondary | ICD-10-CM | POA: Insufficient documentation

## 2017-09-01 LAB — COMPLETE METABOLIC PANEL WITH GFR
AG Ratio: 1.3 (calc) (ref 1.0–2.5)
ALKALINE PHOSPHATASE (APISO): 67 U/L (ref 40–115)
ALT: 17 U/L (ref 9–46)
AST: 19 U/L (ref 10–35)
Albumin: 4 g/dL (ref 3.6–5.1)
BILIRUBIN TOTAL: 0.5 mg/dL (ref 0.2–1.2)
BUN/Creatinine Ratio: 16 (calc) (ref 6–22)
BUN: 19 mg/dL (ref 7–25)
CHLORIDE: 101 mmol/L (ref 98–110)
CO2: 30 mmol/L (ref 20–32)
CREATININE: 1.21 mg/dL — AB (ref 0.70–1.11)
Calcium: 8.8 mg/dL (ref 8.6–10.3)
GFR, Est African American: 65 mL/min/{1.73_m2} (ref >=60)
GFR, Est Non African American: 56 mL/min/{1.73_m2} — ABNORMAL LOW (ref >=60)
GLUCOSE: 109 mg/dL — AB (ref 65–99)
Globulin: 3 g/dL (calc) (ref 1.9–3.7)
Potassium: 4.4 mmol/L (ref 3.5–5.3)
Sodium: 136 mmol/L (ref 135–146)
TOTAL PROTEIN: 7 g/dL (ref 6.1–8.1)

## 2017-09-01 LAB — HEMOGLOBIN A1C
HEMOGLOBIN A1C: 5.7 %{Hb} — AB (ref <5.7)
Mean Plasma Glucose: 117 (calc)
eAG (mmol/L): 6.5 (calc)

## 2017-09-01 LAB — CBC WITH DIFFERENTIAL/PLATELET
Basophils Absolute: 52 cells/uL (ref 0–200)
Basophils Relative: 0.9 %
Eosinophils Absolute: 290 cells/uL (ref 15–500)
Eosinophils Relative: 5 %
HEMATOCRIT: 42.5 % (ref 38.5–50.0)
Hemoglobin: 14.8 g/dL (ref 13.2–17.1)
LYMPHS ABS: 1908 {cells}/uL (ref 850–3900)
MCH: 32.8 pg (ref 27.0–33.0)
MCHC: 34.8 g/dL (ref 32.0–36.0)
MCV: 94.2 fL (ref 80.0–100.0)
MPV: 9.2 fL (ref 7.5–12.5)
Monocytes Relative: 8.3 %
Neutro Abs: 3068 cells/uL (ref 1500–7800)
Neutrophils Relative %: 52.9 %
Platelets: 169 10*3/uL (ref 140–400)
RBC: 4.51 10*6/uL (ref 4.20–5.80)
RDW: 13 % (ref 11.0–15.0)
Total Lymphocyte: 32.9 %
WBC mixed population: 481 cells/uL (ref 200–950)
WBC: 5.8 10*3/uL (ref 3.8–10.8)

## 2017-09-01 LAB — LIPID PANEL
Cholesterol: 218 mg/dL — ABNORMAL HIGH (ref <200)
HDL: 44 mg/dL (ref >40)
LDL CHOLESTEROL (CALC): 144 mg/dL — AB
NON-HDL CHOLESTEROL (CALC): 174 mg/dL — AB (ref <130)
TRIGLYCERIDES: 163 mg/dL — AB (ref <150)
Total CHOL/HDL Ratio: 5 (calc) — ABNORMAL HIGH (ref <5.0)

## 2017-09-02 ENCOUNTER — Other Ambulatory Visit: Payer: Medicare Other

## 2017-09-08 ENCOUNTER — Ambulatory Visit (INDEPENDENT_AMBULATORY_CARE_PROVIDER_SITE_OTHER): Payer: Medicare Other | Admitting: Family Medicine

## 2017-09-08 ENCOUNTER — Encounter: Payer: Self-pay | Admitting: Emergency Medicine

## 2017-09-08 ENCOUNTER — Inpatient Hospital Stay
Admission: EM | Admit: 2017-09-08 | Discharge: 2017-09-12 | DRG: 065 | Disposition: A | Discharge status: Skilled Nursing Facility | Payer: Medicare Other | Attending: Internal Medicine | Admitting: Internal Medicine

## 2017-09-08 ENCOUNTER — Emergency Department: Payer: Medicare Other

## 2017-09-08 ENCOUNTER — Encounter: Payer: Self-pay | Admitting: Family Medicine

## 2017-09-08 ENCOUNTER — Other Ambulatory Visit: Payer: Self-pay

## 2017-09-08 VITALS — BP 148/80 | HR 61 | Temp 97.7°F | Resp 16 | Ht 71.0 in | Wt 233.0 lb

## 2017-09-08 DIAGNOSIS — R55 Syncope and collapse: Secondary | ICD-10-CM | POA: Diagnosis present

## 2017-09-08 DIAGNOSIS — N138 Other obstructive and reflux uropathy: Secondary | ICD-10-CM

## 2017-09-08 DIAGNOSIS — N183 Chronic kidney disease, stage 3 unspecified: Secondary | ICD-10-CM

## 2017-09-08 DIAGNOSIS — R29700 NIHSS score 0: Secondary | ICD-10-CM | POA: Diagnosis present

## 2017-09-08 DIAGNOSIS — I63212 Cerebral infarction due to unspecified occlusion or stenosis of left vertebral arteries: Principal | ICD-10-CM | POA: Diagnosis present

## 2017-09-08 DIAGNOSIS — E782 Mixed hyperlipidemia: Secondary | ICD-10-CM

## 2017-09-08 DIAGNOSIS — E785 Hyperlipidemia, unspecified: Secondary | ICD-10-CM | POA: Diagnosis present

## 2017-09-08 DIAGNOSIS — N400 Enlarged prostate without lower urinary tract symptoms: Secondary | ICD-10-CM | POA: Diagnosis present

## 2017-09-08 DIAGNOSIS — W010XXA Fall on same level from slipping, tripping and stumbling without subsequent striking against object, initial encounter: Secondary | ICD-10-CM | POA: Diagnosis present

## 2017-09-08 DIAGNOSIS — R52 Pain, unspecified: Secondary | ICD-10-CM

## 2017-09-08 DIAGNOSIS — G8194 Hemiplegia, unspecified affecting left nondominant side: Secondary | ICD-10-CM | POA: Diagnosis present

## 2017-09-08 DIAGNOSIS — Z7982 Long term (current) use of aspirin: Secondary | ICD-10-CM

## 2017-09-08 DIAGNOSIS — R112 Nausea with vomiting, unspecified: Secondary | ICD-10-CM | POA: Diagnosis not present

## 2017-09-08 DIAGNOSIS — I6930 Unspecified sequelae of cerebral infarction: Secondary | ICD-10-CM | POA: Diagnosis present

## 2017-09-08 DIAGNOSIS — Y92230 Patient room in hospital as the place of occurrence of the external cause: Secondary | ICD-10-CM | POA: Diagnosis present

## 2017-09-08 DIAGNOSIS — I161 Hypertensive emergency: Secondary | ICD-10-CM | POA: Diagnosis present

## 2017-09-08 DIAGNOSIS — Z87891 Personal history of nicotine dependence: Secondary | ICD-10-CM

## 2017-09-08 DIAGNOSIS — S4292XA Fracture of left shoulder girdle, part unspecified, initial encounter for closed fracture: Secondary | ICD-10-CM | POA: Diagnosis present

## 2017-09-08 DIAGNOSIS — I10 Essential (primary) hypertension: Secondary | ICD-10-CM

## 2017-09-08 DIAGNOSIS — R2689 Other abnormalities of gait and mobility: Secondary | ICD-10-CM | POA: Diagnosis present

## 2017-09-08 DIAGNOSIS — R4020 Unspecified coma: Secondary | ICD-10-CM | POA: Diagnosis not present

## 2017-09-08 DIAGNOSIS — K5900 Constipation, unspecified: Secondary | ICD-10-CM | POA: Diagnosis present

## 2017-09-08 DIAGNOSIS — Z79899 Other long term (current) drug therapy: Secondary | ICD-10-CM

## 2017-09-08 DIAGNOSIS — R109 Unspecified abdominal pain: Secondary | ICD-10-CM | POA: Diagnosis not present

## 2017-09-08 DIAGNOSIS — E669 Obesity, unspecified: Secondary | ICD-10-CM | POA: Diagnosis not present

## 2017-09-08 DIAGNOSIS — N401 Enlarged prostate with lower urinary tract symptoms: Secondary | ICD-10-CM | POA: Diagnosis not present

## 2017-09-08 DIAGNOSIS — R1110 Vomiting, unspecified: Secondary | ICD-10-CM | POA: Diagnosis not present

## 2017-09-08 DIAGNOSIS — I129 Hypertensive chronic kidney disease with stage 1 through stage 4 chronic kidney disease, or unspecified chronic kidney disease: Secondary | ICD-10-CM | POA: Diagnosis present

## 2017-09-08 DIAGNOSIS — Z7951 Long term (current) use of inhaled steroids: Secondary | ICD-10-CM

## 2017-09-08 DIAGNOSIS — I639 Cerebral infarction, unspecified: Secondary | ICD-10-CM

## 2017-09-08 DIAGNOSIS — Z8249 Family history of ischemic heart disease and other diseases of the circulatory system: Secondary | ICD-10-CM

## 2017-09-08 DIAGNOSIS — R739 Hyperglycemia, unspecified: Secondary | ICD-10-CM | POA: Diagnosis present

## 2017-09-08 DIAGNOSIS — E7800 Pure hypercholesterolemia, unspecified: Secondary | ICD-10-CM | POA: Diagnosis present

## 2017-09-08 DIAGNOSIS — R079 Chest pain, unspecified: Secondary | ICD-10-CM

## 2017-09-08 LAB — URINALYSIS, COMPLETE (UACMP) WITH MICROSCOPIC
BACTERIA UA: NONE SEEN
BILIRUBIN URINE: NEGATIVE
Glucose, UA: 150 mg/dL — AB
Hgb urine dipstick: NEGATIVE
KETONES UR: 5 mg/dL — AB
Leukocytes, UA: NEGATIVE
Nitrite: NEGATIVE
PROTEIN: NEGATIVE mg/dL
SQUAMOUS EPITHELIAL / LPF: NONE SEEN (ref 0–5)
Specific Gravity, Urine: 1.013 (ref 1.005–1.030)
pH: 8 (ref 5.0–8.0)

## 2017-09-08 LAB — BASIC METABOLIC PANEL
Anion gap: 7 (ref 5–15)
BUN: 22 mg/dL — ABNORMAL HIGH (ref 6–20)
CHLORIDE: 99 mmol/L — AB (ref 101–111)
CO2: 30 mmol/L (ref 22–32)
CREATININE: 1.01 mg/dL (ref 0.61–1.24)
Calcium: 8.5 mg/dL — ABNORMAL LOW (ref 8.9–10.3)
GFR calc non Af Amer: >60 mL/min (ref >60)
Glucose, Bld: 134 mg/dL — ABNORMAL HIGH (ref 65–99)
POTASSIUM: 4.2 mmol/L (ref 3.5–5.1)
SODIUM: 136 mmol/L (ref 135–145)

## 2017-09-08 LAB — HEPATIC FUNCTION PANEL
ALBUMIN: 4 g/dL (ref 3.5–5.0)
ALK PHOS: 65 U/L (ref 38–126)
ALT: 20 U/L (ref 17–63)
AST: 25 U/L (ref 15–41)
BILIRUBIN INDIRECT: 0.7 mg/dL (ref 0.3–0.9)
Bilirubin, Direct: 0.1 mg/dL (ref 0.1–0.5)
Total Bilirubin: 0.8 mg/dL (ref 0.3–1.2)
Total Protein: 7.8 g/dL (ref 6.5–8.1)

## 2017-09-08 LAB — CBC
HCT: 42.8 % (ref 40.0–52.0)
Hemoglobin: 14.9 g/dL (ref 13.0–18.0)
MCH: 33.2 pg (ref 26.0–34.0)
MCHC: 34.9 g/dL (ref 32.0–36.0)
MCV: 95 fL (ref 80.0–100.0)
Platelets: 183 10*3/uL (ref 150–440)
RBC: 4.51 MIL/uL (ref 4.40–5.90)
RDW: 14 % (ref 11.5–14.5)
WBC: 7 10*3/uL (ref 3.8–10.6)

## 2017-09-08 LAB — TROPONIN I: Troponin I: <0.03 ng/mL (ref <0.03)

## 2017-09-08 MED ORDER — HYDRALAZINE HCL 20 MG/ML IJ SOLN
5.0000 mg | Freq: Once | INTRAMUSCULAR | Status: AC
  Administered 2017-09-08: 5 mg via INTRAVENOUS
  Filled 2017-09-08: qty 1

## 2017-09-08 MED ORDER — SODIUM CHLORIDE 0.9 % IV BOLUS
500.0000 mL | Freq: Once | INTRAVENOUS | Status: AC
  Administered 2017-09-08: 500 mL via INTRAVENOUS

## 2017-09-08 MED ORDER — ASPIRIN 81 MG PO CHEW
324.0000 mg | CHEWABLE_TABLET | Freq: Once | ORAL | Status: AC
  Administered 2017-09-09: 324 mg via ORAL
  Filled 2017-09-08: qty 4

## 2017-09-08 MED ORDER — SIMVASTATIN 20 MG PO TABS: 20.0000 mg | ORAL_TABLET | Freq: Every day | ORAL | 3 refills | Status: DC

## 2017-09-08 MED ORDER — IOPAMIDOL (ISOVUE-370) INJECTION 76%
75.0000 mL | Freq: Once | INTRAVENOUS | Status: AC | PRN
  Administered 2017-09-08: 75 mL via INTRAVENOUS

## 2017-09-08 NOTE — Progress Notes (Signed)
Subjective:    Patient ID: [name], male    DOB: 1934/10/07, 83 y.o.   MRN: [id number]  [name] is a 83 y.o. male presenting on 09/08/2017 for Hypertension   HPI   CHRONIC HTN: Since last visit he has been checking BP readings at home, seems to have some variable readings from 130 to 160 at home. - Did not take medicine last night - he tried to be fasting for today. Current Meds - Terazosin 10mg  daily for BPH Reports good compliance, took meds today. Tolerating well, w/o complaints. - He has not been on ACEi or ARB in past  Elevated A1c Reports he has been eating more ice cream and sweets. His recent A1c 5.7, previously 5.4, no prior dx PreDM CBGs: not checking Meds: never on Currently not on ACEi / ARB Lifestyle: - Diet (He is not aware of carbohydrates and eats 3 sandwiches a day and eats high sugar/carb fruits regularly, Eating ice cream regularly, not following any particular diet, he uses some artificial sweeteners) - Exercise (limited walking, no regular exercise) Denies hypoglycemia, polyuria, visual changes, numbness or tingling.  HYPERLIPIDEMIA / OBESITY BMI >32 - Chronic history. Has been stable on statin. Last results from 5.2019 with elevated LDL and non HDL chol and TG - Currently taking Simvastatin 20mg  nightly , tolerating well without side effects or myalgias - he stays active with walking mostly for exercise, less than he used to - Admits some weight gain, see above gradual  CKD-III Improved Cr now to 1.01 prior 1.2 - He does not take regular NSAIDs - Limited hydration with water intake, see above  BPH with LUTS / Elevated Abnormal PSA Followed by [facility] - Continues to do well on Terazosin only, has not new complaints   Health Maintenance: - Last colonoscopy per outside records 2010 with 2 polyps - admits constipation with some constipation BM size of "pea" pellets at times, others more formed but still separated.  Recently UTD on  Pneumonia vaccine  Depression screen [facility] 2/9 09/08/2017 08/31/2017 05/03/2017  Decreased Interest 0 0 0  Down, Depressed, Hopeless 0 0 0  PHQ - 2 Score 0 0 0    Past Medical History:  Diagnosis Date  . Bowel obstruction ([place])   . BPH (benign prostatic hypertrophy)   . Gastric outlet obstruction   . High cholesterol   . Hyperchloremia   . Hypertension   . Pancreatitis    Past Surgical History:  Procedure Laterality Date  . ESOPHAGOGASTRODUODENOSCOPY N/A 12/03/2014   Procedure: ESOPHAGOGASTRODUODENOSCOPY (EGD);  Surgeon: Hulen Luster, MD;  Location: [facility] ENDOSCOPY;  Service: Endoscopy;  Laterality: N/A;   Social History   Socioeconomic History  . Marital status: Married    Spouse name: Not on file  . Number of children: Not on file  . Years of education: Not on file  . Highest education level: Not on file  Occupational History  . Not on file  Social Needs  . Financial resource strain: Not hard at all  . Food insecurity:    Worry: Never true    Inability: Never true  . Transportation needs:    Medical: No    Non-medical: No  Tobacco Use  . Smoking status: Former Research scientist (life sciences)  . Smokeless tobacco: Never Used  Substance and Sexual Activity  . Alcohol use: No  . Drug use: No  . Sexual activity: Never  Lifestyle  . Physical activity:    Days per week: 0 days  Minutes per session: 0 min  . Stress: Not at all  Relationships  . Social connections:    Talks on phone: Once a week    Gets together: More than three times a week    Attends religious service: More than 4 times per year    Active member of club or organization: No    Attends meetings of clubs or organizations: Never    Relationship status: Married  . Intimate partner violence:    Fear of current or ex partner: No    Emotionally abused: No    Physically abused: No    Forced sexual activity: No  Other Topics Concern  . Not on file  Social History Narrative  . Not on file   Family History  Problem Relation  Age of Onset  . Heart disease Mother   . Heart disease Father   . Heart disease Brother    No current facility-administered medications on file prior to visit.    Current Outpatient Medications on File Prior to Visit  Medication Sig  . Apoaequorin (PREVAGEN EXTRA STRENGTH) 20 MG CAPS Take 20 mg by mouth daily.  Marland Kitchen aspirin EC 81 MG tablet Take 81 mg by mouth daily.  . fluticasone (FLONASE) 50 MCG/ACT nasal spray Place 2 sprays into both nostrils daily.  Marland Kitchen terazosin (HYTRIN) 10 MG capsule TAKE 1 CAPSULE AT BEDTIME    Review of Systems  Constitutional: Negative for activity change, appetite change, chills, diaphoresis, fatigue and fever.  HENT: Negative for congestion and hearing loss.   Eyes: Negative for visual disturbance.  Respiratory: Negative for apnea, cough, choking, chest tightness, shortness of breath and wheezing.   Cardiovascular: Positive for leg swelling. Negative for chest pain and palpitations.  Gastrointestinal: Positive for constipation. Negative for abdominal pain, anal bleeding, blood in stool, diarrhea, nausea and vomiting.  Endocrine: Negative for cold intolerance.  Genitourinary: Negative for decreased urine volume, difficulty urinating, dysuria, frequency, hematuria, testicular pain and urgency.  Musculoskeletal: Negative for arthralgias, back pain and neck pain.  Skin: Negative for rash.  Allergic/Immunologic: Negative for environmental allergies.  Neurological: Negative for dizziness, weakness, light-headedness, numbness and headaches.  Hematological: Negative for adenopathy.  Psychiatric/Behavioral: Negative for behavioral problems, dysphoric mood and sleep disturbance. The patient is not nervous/anxious.    Per HPI unless specifically indicated above     Objective:    BP (!) 148/80 (BP Location: Left Arm, Cuff Size: Normal)   Pulse 61   Temp 97.7 F (36.5 C) (Oral)   Resp 16   Ht 5\' 11"  (1.803 m)   Wt 233 lb (105.7 kg)   SpO2 (!) 16%   BMI 32.50  kg/m   Wt Readings from Last 3 Encounters:  09/08/17 233 lb (105.7 kg)  09/08/17 233 lb (105.7 kg)  08/31/17 232 lb 6.4 oz (105.4 kg)    Physical Exam  Constitutional: He is oriented to person, place, and time. He appears well-developed and well-nourished. No distress.  Well-appearing, comfortable, cooperative  HENT:  Head: Normocephalic and atraumatic.  Mouth/Throat: Oropharynx is clear and moist.  Eyes: Pupils are equal, round, and reactive to light. Conjunctivae and EOM are normal. Right eye exhibits no discharge. Left eye exhibits no discharge.  Neck: Normal range of motion. Neck supple. No thyromegaly present.  Cardiovascular: Normal rate, regular rhythm, normal heart sounds and intact distal pulses.  No murmur heard. Pulmonary/Chest: Effort normal and breath sounds normal. No respiratory distress. He has no wheezes. He has no rales.  Abdominal: Soft. He  exhibits no distension and no mass. There is no tenderness.  Reduced bowel sounds  Musculoskeletal: Normal range of motion. He exhibits no edema or tenderness.  Upper / Lower Extremities: - Normal muscle tone, strength bilateral upper extremities 5/5, lower extremities 5/5  Lymphadenopathy:    He has no cervical adenopathy.  Neurological: He is alert and oriented to person, place, and time.  Distal sensation intact to light touch all extremities  Skin: Skin is warm and dry. No rash noted. He is not diaphoretic. No erythema.  Psychiatric: He has a normal mood and affect. His behavior is normal.  Well groomed, good eye contact, normal speech and thoughts  Nursing note and vitals reviewed.  Results for orders placed or performed in visit on 08/31/17  Hemoglobin A1c  Result Value Ref Range   Hgb A1c MFr Bld 5.7 (H) <5.7 % of total Hgb   Mean Plasma Glucose 117 (calc)   eAG (mmol/L) 6.5 (calc)  CBC with Differential/Platelet  Result Value Ref Range   WBC 5.8 3.8 - 10.8 Thousand/uL   RBC 4.51 4.20 - 5.80 Million/uL    Hemoglobin 14.8 13.2 - 17.1 g/dL   HCT 42.5 38.5 - 50.0 %   MCV 94.2 80.0 - 100.0 fL   MCH 32.8 27.0 - 33.0 pg   MCHC 34.8 32.0 - 36.0 g/dL   RDW 13.0 11.0 - 15.0 %   Platelets 169 140 - 400 Thousand/uL   MPV 9.2 7.5 - 12.5 fL   Neutro Abs 3,068 1,500 - 7,800 cells/uL   Lymphs Abs 1,908 850 - 3,900 cells/uL   WBC mixed population 481 200 - 950 cells/uL   Eosinophils Absolute 290 15 - 500 cells/uL   Basophils Absolute 52 0 - 200 cells/uL   Neutrophils Relative % 52.9 %   Total Lymphocyte 32.9 %   Monocytes Relative 8.3 %   Eosinophils Relative 5.0 %   Basophils Relative 0.9 %  Lipid panel  Result Value Ref Range   Cholesterol 218 (H) <200 mg/dL   HDL 44 >40 mg/dL   Triglycerides 163 (H) <150 mg/dL   LDL Cholesterol (Calc) 144 (H) mg/dL (calc)   Total CHOL/HDL Ratio 5.0 (H) <5.0 (calc)   Non-HDL Cholesterol (Calc) 174 (H) <130 mg/dL (calc)  COMPLETE METABOLIC PANEL WITH GFR  Result Value Ref Range   Glucose, Bld 109 (H) 65 - 99 mg/dL   BUN 19 7 - 25 mg/dL   Creat 1.21 (H) 0.70 - 1.11 mg/dL   GFR, Est Non African American 56 (L) > OR = 60 mL/min/1.53m2   GFR, Est African American 65 > OR = 60 mL/min/1.68m2   BUN/Creatinine Ratio 16 6 - 22 (calc)   Sodium 136 135 - 146 mmol/L   Potassium 4.4 3.5 - 5.3 mmol/L   Chloride 101 98 - 110 mmol/L   CO2 30 20 - 32 mmol/L   Calcium 8.8 8.6 - 10.3 mg/dL   Total Protein 7.0 6.1 - 8.1 g/dL   Albumin 4.0 3.6 - 5.1 g/dL   Globulin 3.0 1.9 - 3.7 g/dL (calc)   AG Ratio 1.3 1.0 - 2.5 (calc)   Total Bilirubin 0.5 0.2 - 1.2 mg/dL   Alkaline phosphatase (APISO) 67 40 - 115 U/L   AST 19 10 - 35 U/L   ALT 17 9 - 46 U/L      Assessment & Plan:   Problem List Items Addressed This Visit    BPH with obstruction/lower urinary tract symptoms Stable on current med, terazosin  Followed by Urology, monitor PSA    CKD (chronic kidney disease), stage III ([place]) Stable to improved on last Cr trend Avoid NSAID Improve hydration    HLD  (hyperlipidemia) - Primary Mildly elevated lipids, attributed to lifestyle Encourage improve diet lifestyle exercise Continue Simvastatin    Relevant Medications   simvastatin (ZOCOR) 20 MG tablet   Hypertension Suboptimal controlled HTN. Did not take medicine today he was incorrectly fasting Elevated initial BP, repeat manual check improved - home readings not available but reported mild elevated Complication with CKD-III   Plan:  1. Continue current BP regimen Terazosin may need to add or adjust in future if still remains high 2. Encourage improved lifestyle - low sodium diet, regular exercise 3. Continue monitor BP outside office, bring readings to next visit, if persistently >140/90 or new symptoms notify office sooner    Relevant Medications   simvastatin (ZOCOR) 20 MG tablet   Obesity (BMI 30.0-34.9) Improve lifestyle diet exercise      #Elevated A1c 5.4 up to 5.7 now, concern mild elevation Encourage lifestyle modifications Defer treatment Follow trend in future q 6 month POC A1c  Meds ordered this encounter  Medications  . simvastatin (ZOCOR) 20 MG tablet    Sig: Take 1 tablet (20 mg total) by mouth at bedtime.    Dispense:  90 tablet    Refill:  3    Follow up plan: Return in about 6 months (around 03/11/2018) for Elevated A1c, HTN, Constipation.  Nobie Putnam, [facility] 09/08/2017, 9:30 AM

## 2017-09-08 NOTE — ED Notes (Signed)
Pt to the ER for weakness. Pt states he was drying off in the shower, bent over and almost passed out. Unable to stand or walk without assistance. Pt vomited in the parking lot. Denies pain or dizziness now. Denies blood in stool. Denies HTN. Denies room spinning or headache.

## 2017-09-08 NOTE — ED Provider Notes (Signed)
[facility] Emergency Department Provider Note    First MD Initiated Contact with Patient 09/08/17 2108     (approximate)  I have reviewed the triage vital signs and the nursing notes.   HISTORY  Chief Complaint Emesis and Weakness    HPI [name] is a 83 y.o. male with a history of SBO and pancreatitis, hypertension hypercholesterolemia presents to the ER after he was drying off in the shower he bent over and felt very lightheaded and there is best to pass out.  Felt very weak and he vomited twice.  Nonbilious nonbloody.  Still feels weak.  Denies any abdominal pain.  Denies any spinning of the room or headache.  No chest pain or shortness of breath.  Was reportedly diaphoretic when checking into triage.  States he was actually at his doctor's office this morning and was otherwise feeling fine.  Does not remember ever having symptoms like this before.  Past Medical History:  Diagnosis Date  . Bowel obstruction ([place])   . BPH (benign prostatic hypertrophy)   . Gastric outlet obstruction   . High cholesterol   . Hyperchloremia   . Hypertension   . Pancreatitis    Family History  Problem Relation Age of Onset  . Heart disease Mother   . Heart disease Father   . Heart disease Brother    Past Surgical History:  Procedure Laterality Date  . ESOPHAGOGASTRODUODENOSCOPY N/A 12/03/2014   Procedure: ESOPHAGOGASTRODUODENOSCOPY (EGD);  Surgeon: Hulen Luster, MD;  Location: [facility] ENDOSCOPY;  Service: Endoscopy;  Laterality: N/A;   Patient Active Problem List   Diagnosis Date Noted  . CKD (chronic kidney disease), stage III ([place]) 09/01/2017  . Elevated PSA, between 10 and less than 20 ng/ml 07/22/2016  . Bilateral lower extremity edema 07/22/2016  . Abnormal glucose 07/15/2016  . BPH with obstruction/lower urinary tract symptoms 04/10/2016  . Hypertension 04/10/2016  . Memory loss 04/10/2016  . Gastric outlet obstruction 12/02/2014  . Generalized abdominal  pain   . Benign fibroma of prostate 01/02/2011  . HLD (hyperlipidemia) 01/02/2011  . Obesity (BMI 30.0-34.9) 01/02/2011      Prior to Admission medications   Medication Sig Start Date End Date Taking? Authorizing Provider  Apoaequorin (PREVAGEN EXTRA STRENGTH) 20 MG CAPS Take 20 mg by mouth daily. 04/10/16  Yes Karamalegos, Devonne Doughty, DO  aspirin EC 81 MG tablet Take 81 mg by mouth daily.   Yes [provider]  fluticasone (FLONASE) 50 MCG/ACT nasal spray Place 2 sprays into both nostrils daily. 07/25/16  Yes Melynda Ripple, MD  simvastatin (ZOCOR) 20 MG tablet Take 1 tablet (20 mg total) by mouth at bedtime. 09/08/17  Yes Karamalegos, Devonne Doughty, DO  terazosin (HYTRIN) 10 MG capsule TAKE 1 CAPSULE AT BEDTIME 01/05/17  Yes Karamalegos, Devonne Doughty, DO    Allergies Patient has no known allergies.    Social History Social History   Tobacco Use  . Smoking status: Former Research scientist (life sciences)  . Smokeless tobacco: Never Used  Substance Use Topics  . Alcohol use: No  . Drug use: No    Review of Systems Patient denies headaches, rhinorrhea, blurry vision, numbness, shortness of breath, chest pain, edema, cough, abdominal pain, nausea, vomiting, diarrhea, dysuria, fevers, rashes or hallucinations unless otherwise stated above in HPI. ____________________________________________   PHYSICAL EXAM:  VITAL SIGNS: Vitals:   09/08/17 2323 09/08/17 2330  BP: (!) 194/90 (!) 179/85  Pulse:  (!) 55  Resp:  15  Temp:  SpO2:  95%    Constitutional: Alert and oriented. Well appearing and in no acute distress. Eyes: Conjunctivae are normal.  Head: Atraumatic. Nose: No congestion/rhinnorhea. Mouth/Throat: Mucous membranes are moist.   Neck: No stridor. Painless ROM.  Cardiovascular: Normal rate, regular rhythm. Grossly normal heart sounds.  Good peripheral circulation. Respiratory: Normal respiratory effort.  No retractions. Lungs CTAB. Gastrointestinal: Soft and nontender. No  distention. No abdominal bruits. No CVA tenderness. Genitourinary:  Musculoskeletal: No lower extremity tenderness nor edema.  No joint effusions. Neurologic:  CN- intact.  No facial droop, Normal FNF.  Normal heel to shin.  Sensation intact bilaterally. Normal speech and language. No gross focal neurologic deficits are appreciated.   Skin:  Skin is warm, dry and intact. No rash noted. Psychiatric: Mood and affect are normal. Speech and behavior are normal.  ____________________________________________   LABS (all labs ordered are listed, but only abnormal results are displayed)  Results for orders placed or performed during the hospital encounter of 09/08/17 (from the past 24 hour(s))  Basic metabolic panel     Status: Abnormal   Collection Time: 09/08/17  9:20 PM  Result Value Ref Range   Sodium 136 135 - 145 mmol/L   Potassium 4.2 3.5 - 5.1 mmol/L   Chloride 99 (L) 101 - 111 mmol/L   CO2 30 22 - 32 mmol/L   Glucose, Bld 134 (H) 65 - 99 mg/dL   BUN 22 (H) 6 - 20 mg/dL   Creatinine, Ser 1.01 0.61 - 1.24 mg/dL   Calcium 8.5 (L) 8.9 - 10.3 mg/dL   GFR calc non Af Amer >60 >60 mL/min   GFR calc Af Amer >60 >60 mL/min   Anion gap 7 5 - 15  CBC     Status: None   Collection Time: 09/08/17  9:20 PM  Result Value Ref Range   WBC 7.0 3.8 - 10.6 K/uL   RBC 4.51 4.40 - 5.90 MIL/uL   Hemoglobin 14.9 13.0 - 18.0 g/dL   HCT 42.8 40.0 - 52.0 %   MCV 95.0 80.0 - 100.0 fL   MCH 33.2 26.0 - 34.0 pg   MCHC 34.9 32.0 - 36.0 g/dL   RDW 14.0 11.5 - 14.5 %   Platelets 183 150 - 440 K/uL  Troponin I     Status: None   Collection Time: 09/08/17  9:20 PM  Result Value Ref Range   Troponin I <0.03 <0.03 ng/mL  Hepatic function panel     Status: None   Collection Time: 09/08/17  9:20 PM  Result Value Ref Range   Total Protein 7.8 6.5 - 8.1 g/dL   Albumin 4.0 3.5 - 5.0 g/dL   AST 25 15 - 41 U/L   ALT 20 17 - 63 U/L   Alkaline Phosphatase 65 38 - 126 U/L   Total Bilirubin 0.8 0.3 - 1.2 mg/dL     Bilirubin, Direct 0.1 0.1 - 0.5 mg/dL   Indirect Bilirubin 0.7 0.3 - 0.9 mg/dL  Urinalysis, Complete w Microscopic     Status: Abnormal   Collection Time: 09/08/17 10:27 PM  Result Value Ref Range   Color, Urine YELLOW (A) YELLOW   APPearance CLOUDY (A) CLEAR   Specific Gravity, Urine 1.013 1.005 - 1.030   pH 8.0 5.0 - 8.0   Glucose, UA 150 (A) NEGATIVE mg/dL   Hgb urine dipstick NEGATIVE NEGATIVE   Bilirubin Urine NEGATIVE NEGATIVE   Ketones, ur 5 (A) NEGATIVE mg/dL   Protein, ur NEGATIVE NEGATIVE mg/dL  Nitrite NEGATIVE NEGATIVE   Leukocytes, UA NEGATIVE NEGATIVE   RBC / HPF 0-5 0 - 5 RBC/hpf   WBC, UA 0-5 0 - 5 WBC/hpf   Bacteria, UA NONE SEEN NONE SEEN   Squamous Epithelial / LPF NONE SEEN 0 - 5   Amorphous Crystal PRESENT    ____________________________________________  EKG My review and personal interpretation at Time: 21:00   Indication: nausea  Rate: 55  Rhythm: sinus Axis: normal Other:  Nonspecific st abn, no stemi ____________________________________________  RADIOLOGY  I personally reviewed all radiographic images ordered to evaluate for the above acute complaints and reviewed radiology reports and findings.  These findings were personally discussed with the patient.  Please see medical record for radiology report.  ____________________________________________   PROCEDURES  Procedure(s) performed:  Procedures    Critical Care performed: no ____________________________________________   INITIAL IMPRESSION / ASSESSMENT AND PLAN / ED COURSE  Pertinent labs & imaging results that were available during my care of the patient were reviewed by me and considered in my medical decision making (see chart for details).  DDX: sbo, enteritis, dehydration, vasovagal, ileus, acs, uti, cva, tia, mass  [name] is a 83 y.o. who presents to the ED with symptoms as described above.  Does not have any focal neuro deficits at this moment is not actually  complaining of any dizziness.  Very broad differential with very vague symptomatology.  His abdominal exam is somewhat concerning particular given his history of SBO with the sudden onset nausea and vomiting will order CT abdomen.  Patient also noted to be hypertensive.  Possible cerebellar infarct but symptoms seem to be waxing and waning which would suggest more TIA pathology.  Will order CT head to further evaluate.  Clinical Course as of Sep 08 2356  Wed Sep 08, 2017  2356 Abdominal CT does not show any evidence of acute abnormality.  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  May have had old infarcts that he has been managing subacutely and now with the surgery of hypertension is becoming more acutely symptomatic.  Will order MRI to further characterize.  Patient will require admission the hospital for blood pressure management and further work-up.   [PR]    Clinical Course User Index [PR] Merlyn Lot, MD     As part of my medical decision making, I reviewed the following data within the [place] notes reviewed and incorporated, Labs reviewed, notes from prior ED visits and [place] Controlled Substance Database   ____________________________________________   FINAL CLINICAL IMPRESSION(S) / ED DIAGNOSES  Final diagnoses:  Hypertensive emergency  Nausea and vomiting, intractability of vomiting not specified, unspecified vomiting type  Near syncope      NEW MEDICATIONS STARTED DURING THIS VISIT:  New Prescriptions   No medications on file     Note:  This document was prepared using Dragon voice recognition software and may include unintentional dictation errors.    Merlyn Lot, MD 09/08/17 2358

## 2017-09-08 NOTE — ED Notes (Signed)
Pt to CT

## 2017-09-08 NOTE — ED Triage Notes (Signed)
Patient states he was at home at approx 1700, taking shower to go to church. States he suddenly became weak and legs gave out, patient collapsed. Denies syncope or hitting head. Patient has since vomited x1 at home and x1 in parking lot prior to coming into ER. Patient also reports being diaphoretic this afternoon, is diaphoretic currently. Patient denies any chest pain or shortness of breath. States this am he went to doctor, went to [place] and felt fine. No unilateral weakness or facial droop present.

## 2017-09-08 NOTE — Patient Instructions (Addendum)
Thank you for coming to the office today.  BP is elevated, improved on re-check, likely because did not take med last night  Try to always take meds eat and drink before see me in the office. Only fast before blood work.  Elevated blood sugar - try to follow some of the diet advice, lower carb and sugar options, limit bread pasta rice pizza, and limit watermelon and pineapple  Drink more water up to 2-3 bottles a day  Restart cholesterol medicine, sent to pharmacy  For Constipation (less frequent bowel movement that can be hard dry or involve straining).  Recommend trying OTC Miralax 17g = 1 capful in large glass water once daily for now, try several days to see if working, goal is soft stool or BM 1-2 times daily, if too loose then reduce dose or try every other day. If not effective may need to increase it to 2 doses at once in AM or may do 1 in morning and 1 in afternoon/evening  - This medicine is very safe and can be used often without any problem and will not make you dehydrated. It is good for use on AS NEEDED BASIS or even MAINTENANCE therapy for longer term for several days to weeks at a time to help regulate bowel movements  Other more natural remedies or preventative treatment: - Increase hydration with water - Increase fiber in diet (high fiber foods = vegetables, leafy greens, oats/grains) - May take OTC Fiber supplement (metamucil powder or pill/gummy) - May try OTC Probiotic  Please schedule a Follow-up Appointment to: Return in about 6 months (around 03/11/2018) for Elevated A1c, HTN, Constipation.  If you have any other questions or concerns, please feel free to call the office or send a message through [facility]. You may also schedule an earlier appointment if necessary.  Additionally, you may be receiving a survey about your experience at our office within a few days to 1 week by e-mail or mail. We value your feedback.  Jonathan Putnam, DO [place]

## 2017-09-09 ENCOUNTER — Encounter: Payer: Self-pay | Admitting: Internal Medicine

## 2017-09-09 ENCOUNTER — Inpatient Hospital Stay: Payer: Medicare Other

## 2017-09-09 ENCOUNTER — Emergency Department: Payer: Medicare Other

## 2017-09-09 ENCOUNTER — Inpatient Hospital Stay (HOSPITAL_COMMUNITY)
Admit: 2017-09-09 | Discharge: 2017-09-09 | Disposition: A | Discharge status: Home / Self Care | Payer: Medicare Other | Attending: Internal Medicine | Admitting: Internal Medicine

## 2017-09-09 DIAGNOSIS — R29700 NIHSS score 0: Secondary | ICD-10-CM | POA: Diagnosis present

## 2017-09-09 DIAGNOSIS — I6509 Occlusion and stenosis of unspecified vertebral artery: Secondary | ICD-10-CM | POA: Diagnosis not present

## 2017-09-09 DIAGNOSIS — G8194 Hemiplegia, unspecified affecting left nondominant side: Secondary | ICD-10-CM | POA: Diagnosis present

## 2017-09-09 DIAGNOSIS — R55 Syncope and collapse: Secondary | ICD-10-CM | POA: Diagnosis not present

## 2017-09-09 DIAGNOSIS — N183 Chronic kidney disease, stage 3 (moderate): Secondary | ICD-10-CM | POA: Diagnosis present

## 2017-09-09 DIAGNOSIS — Z79899 Other long term (current) drug therapy: Secondary | ICD-10-CM | POA: Diagnosis not present

## 2017-09-09 DIAGNOSIS — Z7401 Bed confinement status: Secondary | ICD-10-CM | POA: Diagnosis not present

## 2017-09-09 DIAGNOSIS — R739 Hyperglycemia, unspecified: Secondary | ICD-10-CM | POA: Diagnosis not present

## 2017-09-09 DIAGNOSIS — Z7951 Long term (current) use of inhaled steroids: Secondary | ICD-10-CM | POA: Diagnosis not present

## 2017-09-09 DIAGNOSIS — E7800 Pure hypercholesterolemia, unspecified: Secondary | ICD-10-CM | POA: Diagnosis present

## 2017-09-09 DIAGNOSIS — W010XXA Fall on same level from slipping, tripping and stumbling without subsequent striking against object, initial encounter: Secondary | ICD-10-CM | POA: Diagnosis present

## 2017-09-09 DIAGNOSIS — I639 Cerebral infarction, unspecified: Secondary | ICD-10-CM | POA: Diagnosis not present

## 2017-09-09 DIAGNOSIS — R2681 Unsteadiness on feet: Secondary | ICD-10-CM | POA: Diagnosis not present

## 2017-09-09 DIAGNOSIS — I161 Hypertensive emergency: Secondary | ICD-10-CM | POA: Diagnosis present

## 2017-09-09 DIAGNOSIS — S4292XA Fracture of left shoulder girdle, part unspecified, initial encounter for closed fracture: Secondary | ICD-10-CM | POA: Diagnosis not present

## 2017-09-09 DIAGNOSIS — R079 Chest pain, unspecified: Secondary | ICD-10-CM | POA: Diagnosis not present

## 2017-09-09 DIAGNOSIS — S42202A Unspecified fracture of upper end of left humerus, initial encounter for closed fracture: Secondary | ICD-10-CM | POA: Diagnosis not present

## 2017-09-09 DIAGNOSIS — M6281 Muscle weakness (generalized): Secondary | ICD-10-CM | POA: Diagnosis not present

## 2017-09-09 DIAGNOSIS — I679 Cerebrovascular disease, unspecified: Secondary | ICD-10-CM | POA: Diagnosis not present

## 2017-09-09 DIAGNOSIS — Z87891 Personal history of nicotine dependence: Secondary | ICD-10-CM | POA: Diagnosis not present

## 2017-09-09 DIAGNOSIS — I361 Nonrheumatic tricuspid (valve) insufficiency: Secondary | ICD-10-CM | POA: Diagnosis not present

## 2017-09-09 DIAGNOSIS — I160 Hypertensive urgency: Secondary | ICD-10-CM | POA: Diagnosis not present

## 2017-09-09 DIAGNOSIS — E785 Hyperlipidemia, unspecified: Secondary | ICD-10-CM | POA: Diagnosis present

## 2017-09-09 DIAGNOSIS — S4292XD Fracture of left shoulder girdle, part unspecified, subsequent encounter for fracture with routine healing: Secondary | ICD-10-CM | POA: Diagnosis not present

## 2017-09-09 DIAGNOSIS — R0781 Pleurodynia: Secondary | ICD-10-CM | POA: Diagnosis not present

## 2017-09-09 DIAGNOSIS — S4992XA Unspecified injury of left shoulder and upper arm, initial encounter: Secondary | ICD-10-CM | POA: Diagnosis not present

## 2017-09-09 DIAGNOSIS — J309 Allergic rhinitis, unspecified: Secondary | ICD-10-CM | POA: Diagnosis not present

## 2017-09-09 DIAGNOSIS — M25512 Pain in left shoulder: Secondary | ICD-10-CM | POA: Diagnosis not present

## 2017-09-09 DIAGNOSIS — Z7982 Long term (current) use of aspirin: Secondary | ICD-10-CM | POA: Diagnosis not present

## 2017-09-09 DIAGNOSIS — Y92230 Patient room in hospital as the place of occurrence of the external cause: Secondary | ICD-10-CM | POA: Diagnosis present

## 2017-09-09 DIAGNOSIS — R2689 Other abnormalities of gait and mobility: Secondary | ICD-10-CM | POA: Diagnosis present

## 2017-09-09 DIAGNOSIS — I63212 Cerebral infarction due to unspecified occlusion or stenosis of left vertebral arteries: Secondary | ICD-10-CM | POA: Diagnosis not present

## 2017-09-09 DIAGNOSIS — I129 Hypertensive chronic kidney disease with stage 1 through stage 4 chronic kidney disease, or unspecified chronic kidney disease: Secondary | ICD-10-CM | POA: Diagnosis present

## 2017-09-09 DIAGNOSIS — Z8249 Family history of ischemic heart disease and other diseases of the circulatory system: Secondary | ICD-10-CM | POA: Diagnosis not present

## 2017-09-09 DIAGNOSIS — D291 Benign neoplasm of prostate: Secondary | ICD-10-CM | POA: Diagnosis not present

## 2017-09-09 DIAGNOSIS — I10 Essential (primary) hypertension: Secondary | ICD-10-CM | POA: Diagnosis not present

## 2017-09-09 DIAGNOSIS — I6350 Cerebral infarction due to unspecified occlusion or stenosis of unspecified cerebral artery: Secondary | ICD-10-CM | POA: Diagnosis not present

## 2017-09-09 DIAGNOSIS — I6930 Unspecified sequelae of cerebral infarction: Secondary | ICD-10-CM | POA: Diagnosis present

## 2017-09-09 DIAGNOSIS — K5900 Constipation, unspecified: Secondary | ICD-10-CM | POA: Diagnosis present

## 2017-09-09 DIAGNOSIS — N400 Enlarged prostate without lower urinary tract symptoms: Secondary | ICD-10-CM | POA: Diagnosis present

## 2017-09-09 DIAGNOSIS — S42252A Displaced fracture of greater tuberosity of left humerus, initial encounter for closed fracture: Secondary | ICD-10-CM | POA: Diagnosis not present

## 2017-09-09 DIAGNOSIS — Z5189 Encounter for other specified aftercare: Secondary | ICD-10-CM | POA: Diagnosis not present

## 2017-09-09 LAB — GLUCOSE, CAPILLARY
GLUCOSE-CAPILLARY: 138 mg/dL — AB (ref 65–99)
GLUCOSE-CAPILLARY: 113 mg/dL — AB (ref 65–99)
Glucose-Capillary: 135 mg/dL — ABNORMAL HIGH (ref 65–99)
Glucose-Capillary: 120 mg/dL — ABNORMAL HIGH (ref 65–99)

## 2017-09-09 LAB — TROPONIN I: Troponin I: <0.03 ng/mL (ref <0.03)

## 2017-09-09 LAB — HEMOGLOBIN A1C
Hgb A1c MFr Bld: 5.4 % (ref 4.8–5.6)
Mean Plasma Glucose: 108.28 mg/dL

## 2017-09-09 LAB — ECHOCARDIOGRAM COMPLETE
HEIGHTINCHES: 71 in
Weight: 3736 oz

## 2017-09-09 LAB — POCT I-STAT CREATININE: Creatinine, Ser: 1 mg/dL (ref 0.61–1.24)

## 2017-09-09 MED ORDER — TERAZOSIN HCL 5 MG PO CAPS
10.0000 mg | ORAL_CAPSULE | Freq: Every day | ORAL | Status: DC
  Administered 2017-09-09 – 2017-09-11 (×3): 10 mg via ORAL
  Filled 2017-09-09 (×4): qty 2

## 2017-09-09 MED ORDER — ONDANSETRON HCL 4 MG/2ML IJ SOLN
4.0000 mg | Freq: Once | INTRAMUSCULAR | Status: AC
  Administered 2017-09-09: 4 mg via INTRAVENOUS

## 2017-09-09 MED ORDER — ENOXAPARIN SODIUM 40 MG/0.4ML ~~LOC~~ SOLN
40.0000 mg | SUBCUTANEOUS | Status: DC
  Administered 2017-09-10 – 2017-09-12 (×3): 40 mg via SUBCUTANEOUS
  Filled 2017-09-09 (×5): qty 0.4

## 2017-09-09 MED ORDER — APOAEQUORIN 20 MG PO CAPS: 20.0000 mg | ORAL_CAPSULE | Freq: Every day | ORAL | Status: DC

## 2017-09-09 MED ORDER — ATORVASTATIN CALCIUM 20 MG PO TABS
40.0000 mg | ORAL_TABLET | Freq: Every day | ORAL | Status: DC
  Administered 2017-09-09 – 2017-09-11 (×3): 40 mg via ORAL
  Filled 2017-09-09 (×3): qty 2

## 2017-09-09 MED ORDER — SODIUM CHLORIDE 0.9 % IV SOLN
INTRAVENOUS | Status: AC
  Administered 2017-09-09 (×2): via INTRAVENOUS

## 2017-09-09 MED ORDER — ONDANSETRON HCL 4 MG/2ML IJ SOLN
INTRAMUSCULAR | Status: AC
  Administered 2017-09-09: 4 mg
  Filled 2017-09-09: qty 4

## 2017-09-09 MED ORDER — STROKE: EARLY STAGES OF RECOVERY BOOK
Freq: Once | Status: AC
  Administered 2017-09-09: 04:00:00

## 2017-09-09 MED ORDER — ASPIRIN EC 81 MG PO TBEC
81.0000 mg | DELAYED_RELEASE_TABLET | Freq: Every day | ORAL | Status: DC
  Administered 2017-09-09 – 2017-09-12 (×4): 81 mg via ORAL
  Filled 2017-09-09 (×4): qty 1

## 2017-09-09 MED ORDER — ACETAMINOPHEN 650 MG RE SUPP: 650.0000 mg | Freq: Four times a day (QID) | RECTAL | Status: DC | PRN

## 2017-09-09 MED ORDER — ONDANSETRON HCL 4 MG/2ML IJ SOLN: 4.0000 mg | Freq: Once | INTRAMUSCULAR | Status: DC

## 2017-09-09 MED ORDER — ACETAMINOPHEN 325 MG PO TABS: 650.0000 mg | ORAL_TABLET | Freq: Four times a day (QID) | ORAL | Status: DC | PRN

## 2017-09-09 MED ORDER — CLOPIDOGREL BISULFATE 75 MG PO TABS
75.0000 mg | ORAL_TABLET | Freq: Every day | ORAL | Status: DC
  Administered 2017-09-09 – 2017-09-12 (×4): 75 mg via ORAL
  Filled 2017-09-09 (×4): qty 1

## 2017-09-09 MED ORDER — SENNOSIDES-DOCUSATE SODIUM 8.6-50 MG PO TABS
1.0000 | ORAL_TABLET | Freq: Every evening | ORAL | Status: DC | PRN
  Administered 2017-09-10: 1 via ORAL
  Filled 2017-09-09: qty 1

## 2017-09-09 MED ORDER — SODIUM CHLORIDE 0.9% FLUSH
3.0000 mL | Freq: Two times a day (BID) | INTRAVENOUS | Status: DC
  Administered 2017-09-09 – 2017-09-12 (×7): 3 mL via INTRAVENOUS

## 2017-09-09 MED ORDER — BISACODYL 5 MG PO TBEC: 5.0000 mg | DELAYED_RELEASE_TABLET | Freq: Every day | ORAL | Status: DC | PRN

## 2017-09-09 MED ORDER — IOPAMIDOL (ISOVUE-370) INJECTION 76%
75.0000 mL | Freq: Once | INTRAVENOUS | Status: AC | PRN
  Administered 2017-09-09: 75 mL via INTRAVENOUS

## 2017-09-09 MED ORDER — LISINOPRIL 5 MG PO TABS
5.0000 mg | ORAL_TABLET | Freq: Every day | ORAL | Status: DC
  Administered 2017-09-09 – 2017-09-12 (×4): 5 mg via ORAL
  Filled 2017-09-09 (×4): qty 1

## 2017-09-09 MED ORDER — ONDANSETRON HCL 4 MG/2ML IJ SOLN: 4.0000 mg | Freq: Four times a day (QID) | INTRAMUSCULAR | Status: DC | PRN

## 2017-09-09 MED ORDER — SIMVASTATIN 20 MG PO TABS: 20.0000 mg | ORAL_TABLET | Freq: Every day | ORAL | Status: DC

## 2017-09-09 MED ORDER — ONDANSETRON HCL 4 MG PO TABS: 4.0000 mg | ORAL_TABLET | Freq: Four times a day (QID) | ORAL | Status: DC | PRN

## 2017-09-09 NOTE — Progress Notes (Addendum)
[facility] NAME: [name]    MR#:  [id number]  DATE OF BIRTH:  12-14-1934  SUBJECTIVE:  Patient seen and evaluated today No dizziness and lightheadedness No vomitings Has slight difficulty in speech Had some gait imbalance when he presented to the emergency room   REVIEW OF SYSTEMS:    ROS  CONSTITUTIONAL: No documented fever. No fatigue, weakness. No weight gain, no weight loss.  EYES: No blurry or double vision.  ENT: No tinnitus. No postnasal drip. No redness of the oropharynx.  RESPIRATORY: No cough, no wheeze, no hemoptysis. No dyspnea.  CARDIOVASCULAR: No chest pain. No orthopnea. No palpitations. No syncope.  GASTROINTESTINAL: No nausea, no vomiting or diarrhea. No abdominal pain. No melena or hematochezia.  GENITOURINARY: No dysuria or hematuria.  ENDOCRINE: No polyuria or nocturia. No heat or cold intolerance.  HEMATOLOGY: No anemia. No bruising. No bleeding.  INTEGUMENTARY: No rashes. No lesions.  MUSCULOSKELETAL: No arthritis. No swelling. No gout.  NEUROLOGIC: No numbness, tingling, or ataxia. No seizure-type activity.  Difficulty in speech PSYCHIATRIC: No anxiety. No insomnia. No ADD.   DRUG ALLERGIES:  No Known Allergies  VITALS:  Blood pressure (!) 170/77, pulse (!) 55, temperature (!) 97.5 F (36.4 C), temperature source Oral, resp. rate 18, height 5\' 11"  (1.803 m), weight 105.9 kg (233 lb 8 oz), SpO2 96 %.  PHYSICAL EXAMINATION:   Physical Exam  GENERAL:  83 y.o.-year-old patient lying in the bed with no acute distress.  EYES: Pupils equal, round, reactive to light and accommodation. No scleral icterus. Extraocular muscles intact.  HEENT: Head atraumatic, normocephalic. Oropharynx and nasopharynx clear.  NECK:  Supple, no jugular venous distention. No thyroid enlargement, no tenderness.  LUNGS: Normal breath sounds bilaterally, no wheezing, rales, rhonchi. No use of accessory muscles of respiration.   CARDIOVASCULAR: S1, S2 normal. No murmurs, rubs, or gallops.  ABDOMEN: Soft, nontender, nondistended. Bowel sounds present. No organomegaly or mass.  EXTREMITIES: No cyanosis, clubbing or edema b/l.    NEUROLOGIC: Cranial nerves II through XII are intact. No focal Motor or sensory deficits b/l.   Mild difficulty in speech PSYCHIATRIC: The patient is alert and oriented x 3.  SKIN: No obvious rash, lesion, or ulcer.   LABORATORY PANEL:   CBC Recent Labs  Lab 09/08/17 2120  WBC 7.0  HGB 14.9  HCT 42.8  PLT 183   ------------------------------------------------------------------------------------------------------------------ Chemistries  Recent Labs  Lab 09/08/17 2120  NA 136  K 4.2  CL 99*  CO2 30  GLUCOSE 134*  BUN 22*  CREATININE 1.01  CALCIUM 8.5*  AST 25  ALT 20  ALKPHOS 65  BILITOT 0.8   ------------------------------------------------------------------------------------------------------------------  Cardiac Enzymes Recent Labs  Lab 09/09/17 0701  TROPONINI <0.03   ------------------------------------------------------------------------------------------------------------------  RADIOLOGY:  Ct Head Wo Contrast  Result Date: 09/08/2017 CLINICAL DATA:  Altered LOC EXAM: CT HEAD WITHOUT CONTRAST TECHNIQUE: Contiguous axial images were obtained from the base of the skull through the vertex without intravenous contrast. COMPARISON:  None. FINDINGS: Brain: No acute territorial infarction, hemorrhage, or intracranial mass. Old left cerebellar infarct. Mild to moderate atrophy. Mild small vessel ischemic changes of the white matter. Slightly prominent ventricles, felt related to atrophy. Vascular: No hyperdense vessels.  Carotid vascular calcification Skull: Normal. Negative for fracture or focal lesion. Left mastoid sclerosis. Small amount of fluid in the inferior mastoids bilaterally. Sinuses/Orbits: Mild mucosal thickening in the maxillary and ethmoid sinuses. No  acute orbital abnormality Other: None IMPRESSION: 1.  No CT evidence for acute intracranial abnormality. 2. Atrophy and small vessel ischemic changes of the white matter. Old left cerebellar infarct. Electronically Signed   By: Donavan Foil M.D.   On: 09/08/2017 23:25   Mr Angiogram Head Wo Contrast  Result Date: 09/09/2017 CLINICAL DATA:  Presyncope in shower. History of hypertension, hypercholesterolemia. EXAM: MRI HEAD WITHOUT CONTRAST MRA HEAD WITHOUT CONTRAST TECHNIQUE: Multiplanar, multiecho pulse sequences of the brain and surrounding structures were obtained without intravenous contrast. Angiographic images of the head were obtained using MRA technique without contrast. COMPARISON:  CT HEAD Sep 08, 2017 FINDINGS: MRI HEAD FINDINGS INTRACRANIAL CONTENTS: 2 subcentimeter reduced diffusion LEFT cerebellum with low ADC values. Old bilateral cerebellar infarcts. Ventricles and sulci are normal for patient's age. Patchy supratentorial white matter FLAIR T2 hyperintensities compatible with mild chronic small vessel ischemic disease, less than expected for age. Old LEFT thalamus lacunar infarct. Prominent basal ganglia perivascular spaces associated with chronic small vessel ischemic changes. No midline shift, mass effect or masses. No abnormal extra-axial fluid collections. VASCULAR: Normal major intracranial vascular flow voids present at skull base. SKULL AND UPPER CERVICAL SPINE: No abnormal sellar expansion. No suspicious calvarial bone marrow signal. Old appearing base of dens fracture with mild canal stenosis. SINUSES/ORBITS: Small mastoid effusions in trace paranasal sinus mucosal thickening.The included ocular globes and orbital contents are non-suspicious. Status post bilateral ocular lens implants. OTHER: 1 cm cyst RIGHT fossa of Rosenmller. MRA HEAD FINDINGS ANTERIOR CIRCULATION: Normal flow related enhancement of the included cervical, petrous, cavernous and supraclinoid internal carotid arteries.  Patent anterior communicating artery. Patent anterior and middle cerebral arteries, including distal segments. No large vessel occlusion, flow limiting stenosis, aneurysm. POSTERIOR CIRCULATION: LEFT vertebral artery is dominant. Basilar artery is patent, with normal flow related enhancement of the main branch vessels. Patent posterior cerebral arteries. Severe stenosis RIGHT P2 origin associated with tortuous segment resulting in flow artifact. No large vessel occlusion, aneurysm. ANATOMIC VARIANTS: None. Source images and MIP images were reviewed. IMPRESSION: MRI HEAD: 1. Two acute subcentimeter LEFT cerebellar nonhemorrhagic infarcts. 2. Old small cerebellar and LEFT thalamus infarcts. Mild chronic small vessel ischemic changes. 3. Old appearing base of dens fracture would be better characterized on NONEMERGENT CT cervical spine. MRA HEAD: 1. No emergent large vessel occlusion. 2. Severe stenosis RIGHT P2 segment may be overestimated by flow artifact. Electronically Signed   By: Elon Alas M.D.   On: 09/09/2017 02:58   Mr Brain Wo Contrast  Result Date: 09/09/2017 CLINICAL DATA:  Presyncope in shower. History of hypertension, hypercholesterolemia. EXAM: MRI HEAD WITHOUT CONTRAST MRA HEAD WITHOUT CONTRAST TECHNIQUE: Multiplanar, multiecho pulse sequences of the brain and surrounding structures were obtained without intravenous contrast. Angiographic images of the head were obtained using MRA technique without contrast. COMPARISON:  CT HEAD Sep 08, 2017 FINDINGS: MRI HEAD FINDINGS INTRACRANIAL CONTENTS: 2 subcentimeter reduced diffusion LEFT cerebellum with low ADC values. Old bilateral cerebellar infarcts. Ventricles and sulci are normal for patient's age. Patchy supratentorial white matter FLAIR T2 hyperintensities compatible with mild chronic small vessel ischemic disease, less than expected for age. Old LEFT thalamus lacunar infarct. Prominent basal ganglia perivascular spaces associated with chronic  small vessel ischemic changes. No midline shift, mass effect or masses. No abnormal extra-axial fluid collections. VASCULAR: Normal major intracranial vascular flow voids present at skull base. SKULL AND UPPER CERVICAL SPINE: No abnormal sellar expansion. No suspicious calvarial bone marrow signal. Old appearing base of dens fracture with mild canal stenosis. SINUSES/ORBITS: Small mastoid effusions in trace  paranasal sinus mucosal thickening.The included ocular globes and orbital contents are non-suspicious. Status post bilateral ocular lens implants. OTHER: 1 cm cyst RIGHT fossa of Rosenmller. MRA HEAD FINDINGS ANTERIOR CIRCULATION: Normal flow related enhancement of the included cervical, petrous, cavernous and supraclinoid internal carotid arteries. Patent anterior communicating artery. Patent anterior and middle cerebral arteries, including distal segments. No large vessel occlusion, flow limiting stenosis, aneurysm. POSTERIOR CIRCULATION: LEFT vertebral artery is dominant. Basilar artery is patent, with normal flow related enhancement of the main branch vessels. Patent posterior cerebral arteries. Severe stenosis RIGHT P2 origin associated with tortuous segment resulting in flow artifact. No large vessel occlusion, aneurysm. ANATOMIC VARIANTS: None. Source images and MIP images were reviewed. IMPRESSION: MRI HEAD: 1. Two acute subcentimeter LEFT cerebellar nonhemorrhagic infarcts. 2. Old small cerebellar and LEFT thalamus infarcts. Mild chronic small vessel ischemic changes. 3. Old appearing base of dens fracture would be better characterized on NONEMERGENT CT cervical spine. MRA HEAD: 1. No emergent large vessel occlusion. 2. Severe stenosis RIGHT P2 segment may be overestimated by flow artifact. Electronically Signed   By: Elon Alas M.D.   On: 09/09/2017 02:58   Ct Abdomen Pelvis W Contrast  Result Date: 09/08/2017 CLINICAL DATA:  Acute abdominal pain.  Nausea and vomiting. EXAM: CT ABDOMEN AND  PELVIS WITH CONTRAST TECHNIQUE: Multidetector CT imaging of the abdomen and pelvis was performed using the standard protocol following bolus administration of intravenous contrast. CONTRAST:  45mL ISOVUE-370 IOPAMIDOL (ISOVUE-370) INJECTION 76% COMPARISON:  CT 12/02/2014 FINDINGS: Lower chest: Minimal scarring at the right lung base. No pleural fluid. Hepatobiliary: Subcentimeter low-density lesion in the anterior left lobe of the liver is unchanged from prior exam. Diffuse hepatic steatosis with focal fatty sparing adjacent the gallbladder fossa. Gallbladder physiologically distended, no calcified stone. No biliary dilatation. Pancreas: No ductal dilatation or inflammation. Spleen: Normal in size without focal abnormality. Adrenals/Urinary Tract: Normal adrenal glands. No hydronephrosis. Minimal by oral perinephric edema, nonspecific. Homogeneous renal enhancement and symmetric excretion on delayed phase imaging. Urinary bladder is partially distended. Small bladder diverticulum at the dome. Stomach/Bowel: stomach physiologically distended, no abnormal gastric distension on the current exam. No small bowel dilatation, inflammation or obstruction. Appendix tentatively identified and normal. Colonic diverticulosis distally without acute diverticulitis. Vascular/Lymphatic: Aortic atherosclerosis. Ectatic distal aorta measuring 2.7 cm. Small central mesenteric nodes without pathologic adenopathy. Reproductive: Heterogeneous prostate gland spanning 5.1 cm transverse. Other: Bilateral fat containing inguinal hernias, slightly larger on the right. Minimal laxity of anterior abdominal wall musculature with small fat containing umbilical hernia. No free air, free fluid, or intra-abdominal fluid collection. Musculoskeletal: There are no acute or suspicious osseous abnormalities. IMPRESSION: 1. No acute abnormality in the abdomen/pelvis. 2. Colonic diverticulosis without diverticulitis. 3. Ectatic infrarenal abdominal aorta  maximal dimension 2.7 cm at risk for aneurysm development. Recommend followup by ultrasound in 5 years. This recommendation follows ACR consensus guidelines: White Paper of the ACR Incidental Findings Committee II on Vascular Findings. J Am Coll Radiol 2013; 10:789-794. Aortic Atherosclerosis (ICD10-I70.0). 4. Bilateral fat containing inguinal hernias. Mild hepatic steatosis. Electronically Signed   By: Jeb Levering M.D.   On: 09/08/2017 23:39   US Carotid Bilateral  Result Date: 09/09/2017 CLINICAL DATA:  83 year old male with acute left cerebellar infarcts EXAM: BILATERAL CAROTID DUPLEX ULTRASOUND TECHNIQUE: Pearline Cables scale imaging, color Doppler and duplex ultrasound were performed of bilateral carotid and vertebral arteries in the neck. COMPARISON:  Brain MRI 09/09/2017 FINDINGS: Criteria: Quantification of carotid stenosis is based on velocity parameters that correlate the residual internal  carotid diameter with NASCET-based stenosis levels, using the diameter of the distal internal carotid lumen as the denominator for stenosis measurement. The following velocity measurements were obtained: RIGHT ICA: 77/16 cm/sec CCA: 49/67 cm/sec SYSTOLIC ICA/CCA RATIO:  0.8 ECA:  93 cm/sec LEFT ICA: 112/20 cm/sec CCA: 591/63 cm/sec SYSTOLIC ICA/CCA RATIO:  0.9 ECA:  137 cm/sec RIGHT CAROTID ARTERY: No significant atherosclerotic plaque or evidence of stenosis. RIGHT VERTEBRAL ARTERY:  Patent with normal antegrade flow. LEFT CAROTID ARTERY: No significant atherosclerotic plaque or evidence of stenosis. LEFT VERTEBRAL ARTERY:  Patent with normal antegrade flow. IMPRESSION: Negative examination. No significant atherosclerotic plaque or evidence of stenosis. Signed, Criselda Peaches, MD [facility] Electronically Signed   By: Jacqulynn Cadet M.D.   On: 09/09/2017 14:23     ASSESSMENT AND PLAN:  83 year old elderly male patient with history of hyperlipidemia,  hypertension presented to the emergency room with gait imbalance and weakness  - Left cerebellar CVA ( two small ) Chronic left cerebellar and thalamic infarcts Appreciate neurology consultation Follow-up CTA neck Continue aspirin and Plavix Echocardiogram reviewed shows no source of emboli EF of 60 to 65%  carotid ultrasound No disease Physical therapy occupational therapy and speech therapy consult  -Hyperlipidemia Continue statin medication  -Hypertension Continue ACE inhibitor  -DVT prophylaxis with subcu Lovenox daily  All the records are reviewed and case discussed with Care Management/Social Worker. Management plans discussed with the patient, family and they are in agreement.  CODE STATUS: Full code  DVT Prophylaxis: SCDs  TOTAL TIME TAKING CARE OF THIS PATIENT: 35 minutes.   POSSIBLE D/C IN 1 to 2 DAYS, DEPENDING ON CLINICAL CONDITION.  Saundra Shelling M.D on 09/09/2017 at 4:41 PM  Between 7am to 6pm - Pager - [phone]  After 6pm go to [email or web address] - password [email or web address] [facility]  Office  [phone]  CC: Primary care physician; [name], DO  Note: This dictation was prepared with Dragon dictation along with smaller phrase technology. Any transcriptional errors that result from this process are unintentional.

## 2017-09-09 NOTE — Progress Notes (Signed)
CTA neck shows vertebral artery occlusion Discussed with Neurology attending who recommends to continue aspirin, plavix and continue statin and out patient f/u.

## 2017-09-09 NOTE — Progress Notes (Signed)
*  PRELIMINARY RESULTS* Echocardiogram 2D Echocardiogram has been performed.  Sherrie Sport 09/09/2017, 10:56 AM

## 2017-09-09 NOTE — ED Notes (Signed)
Pt back from CT. IV infiltrated in the [facility] with contrast. Mild swelling . Pt denies pain. Ice applied and arm elevated on a pillow.

## 2017-09-09 NOTE — H&P (Signed)
[facility] NAME: [name]    MR#:  [id number]  DATE OF BIRTH:  23-Sep-1934  DATE OF ADMISSION:  09/08/2017  PRIMARY CARE PHYSICIAN: [name], DO   REQUESTING/REFERRING PHYSICIAN: Merlyn Lot, MD  CHIEF COMPLAINT:   Chief Complaint  Patient presents with  . Emesis  . Weakness    HISTORY OF PRESENT ILLNESS:  [name]  is a 83 y.o. male with a known history of HTN, HLD p/w 1d Hx generalized weakness and difficulty w/ balance. Was getting out of shower @~1800PM, bent down to dry feet, felt very weak all over and almost fell forward. Wife helped pt stand up, w/ help from neighbor got pt into car and drove to ED. (+) nausea, (+) lightheadedness/near-syncope, (-) vomiting. (-) vertigo. (-) LOC. (-) HA, (-) blurred vision, (-) acute change in hearing/vision. BP as high as 203/90 in ED, SBP improved to 170s at time of my assessment. Neuro exam, including sensory, motor, reflex, cranial nerve testing WNL. Exam otherwise benign. Labs (+) hyperglycemia (Glu 134). CT head (+) old L cerebellar infarct. MRI/MRA (+) 2x acute subcentimeter L cerebellar nonhemorrhagic infarcts. Pt says he has been off ASA x76yr. He is a non-smoker. His wife says he has been getting progressively weaker x3yr+. Admit to Tele on CVA pathway.  PAST MEDICAL HISTORY:   Past Medical History:  Diagnosis Date  . Bowel obstruction ([place])   . BPH (benign prostatic hypertrophy)   . Gastric outlet obstruction   . High cholesterol   . Hyperchloremia   . Hypertension   . Pancreatitis     PAST SURGICAL HISTORY:   Past Surgical History:  Procedure Laterality Date  . ESOPHAGOGASTRODUODENOSCOPY N/A 12/03/2014   Procedure: ESOPHAGOGASTRODUODENOSCOPY (EGD);  Surgeon: Hulen Luster, MD;  Location: [facility] ENDOSCOPY;  Service: Endoscopy;  Laterality: N/A;    SOCIAL HISTORY:   Social History   Tobacco Use  . Smoking status: Former Research scientist (life sciences)  . Smokeless  tobacco: Never Used  Substance Use Topics  . Alcohol use: No    FAMILY HISTORY:   Family History  Problem Relation Age of Onset  . Heart disease Mother   . Heart disease Father   . Heart disease Brother     DRUG ALLERGIES:  No Known Allergies  REVIEW OF SYSTEMS:   Review of Systems  Constitutional: Positive for malaise/fatigue. Negative for chills, diaphoresis, fever and weight loss.  HENT: Negative for congestion, ear pain, hearing loss, nosebleeds, sinus pain, sore throat and tinnitus.   Eyes: Negative for blurred vision, double vision and photophobia.  Respiratory: Negative for cough, hemoptysis, sputum production, shortness of breath and wheezing.   Cardiovascular: Negative for chest pain, palpitations, orthopnea, claudication, leg swelling and PND.  Gastrointestinal: Positive for nausea. Negative for abdominal pain, blood in stool, constipation, diarrhea, heartburn, melena and vomiting.  Genitourinary: Negative for dysuria, frequency, hematuria and urgency.  Musculoskeletal: Negative for back pain, falls, joint pain, myalgias and neck pain.  Skin: Negative for itching and rash.  Neurological: Positive for dizziness (+) LH and weakness. Negative for tingling, tremors, sensory change, speech change, focal weakness, seizures, loss of consciousness and headaches.  Psychiatric/Behavioral: Negative for memory loss. The patient does not have insomnia.     MEDICATIONS AT HOME:   Prior to Admission medications   Medication Sig Start Date End Date Taking? Authorizing Provider  Apoaequorin (PREVAGEN EXTRA STRENGTH) 20 MG CAPS Take 20 mg by mouth daily. 04/10/16  Yes Karamalegos,  Devonne Doughty, DO  aspirin EC 81 MG tablet Take 81 mg by mouth daily.   Yes [provider]  fluticasone (FLONASE) 50 MCG/ACT nasal spray Place 2 sprays into both nostrils daily. 07/25/16  Yes Melynda Ripple, MD  simvastatin (ZOCOR) 20 MG tablet Take 1 tablet (20 mg total) by mouth at bedtime.  09/08/17  Yes Karamalegos, Devonne Doughty, DO  terazosin (HYTRIN) 10 MG capsule TAKE 1 CAPSULE AT BEDTIME 01/05/17  Yes Karamalegos, Devonne Doughty, DO      VITAL SIGNS:  Blood pressure (!) 177/92, pulse 60, temperature 98.2 F (36.8 C), resp. rate 18, height 5\' 11"  (1.803 m), weight 105.7 kg (233 lb), SpO2 97 %.  PHYSICAL EXAMINATION:  Physical Exam  Constitutional: He is oriented to person, place, and time. He appears well-developed and well-nourished. He is active and cooperative.  Non-toxic appearance. He does not have a sickly appearance. He does not appear ill. No distress. He is not intubated.  HENT:  Head: Normocephalic and atraumatic.  Mouth/Throat: Oropharynx is clear and moist. No oropharyngeal exudate.  Eyes: Conjunctivae, EOM and lids are normal. No scleral icterus.  Neck: Neck supple. No JVD present. No thyromegaly present.  Cardiovascular: Normal rate, regular rhythm and normal heart sounds.  No extrasystoles are present. Exam reveals no gallop, no S3, no S4, no distant heart sounds and no friction rub.  No murmur heard. Pulmonary/Chest: Effort normal and breath sounds normal. No accessory muscle usage or stridor. No apnea, no tachypnea and no bradypnea. He is not intubated. No respiratory distress. He has no decreased breath sounds. He has no wheezes. He has no rhonchi. He has no rales.  Abdominal: Soft. Bowel sounds are normal. He exhibits no distension. There is no tenderness. There is no rebound and no guarding.  Musculoskeletal: Normal range of motion. He exhibits no edema or tenderness.  Lymphadenopathy:    He has no cervical adenopathy.  Neurological: He is alert and oriented to person, place, and time. He has normal strength and normal reflexes. He is not disoriented. He displays normal reflexes. No cranial nerve deficit or sensory deficit. He exhibits normal muscle tone.  Skin: Skin is warm, dry and intact. No rash noted. He is not diaphoretic. No erythema. No pallor.    Psychiatric: He has a normal mood and affect. His behavior is normal. Judgment and thought content normal. His speech is tangential. Cognition and memory are normal.   LABORATORY PANEL:   CBC Recent Labs  Lab 09/08/17 2120  WBC 7.0  HGB 14.9  HCT 42.8  PLT 183   ------------------------------------------------------------------------------------------------------------------  Chemistries  Recent Labs  Lab 09/08/17 2120  NA 136  K 4.2  CL 99*  CO2 30  GLUCOSE 134*  BUN 22*  CREATININE 1.01  CALCIUM 8.5*  AST 25  ALT 20  ALKPHOS 65  BILITOT 0.8   ------------------------------------------------------------------------------------------------------------------  Cardiac Enzymes Recent Labs  Lab 09/08/17 2120  TROPONINI <0.03   ------------------------------------------------------------------------------------------------------------------  RADIOLOGY:  Ct Head Wo Contrast  Result Date: 09/08/2017 CLINICAL DATA:  Altered LOC EXAM: CT HEAD WITHOUT CONTRAST TECHNIQUE: Contiguous axial images were obtained from the base of the skull through the vertex without intravenous contrast. COMPARISON:  None. FINDINGS: Brain: No acute territorial infarction, hemorrhage, or intracranial mass. Old left cerebellar infarct. Mild to moderate atrophy. Mild small vessel ischemic changes of the white matter. Slightly prominent ventricles, felt related to atrophy. Vascular: No hyperdense vessels.  Carotid vascular calcification Skull: Normal. Negative for fracture or focal lesion. Left mastoid sclerosis.  Small amount of fluid in the inferior mastoids bilaterally. Sinuses/Orbits: Mild mucosal thickening in the maxillary and ethmoid sinuses. No acute orbital abnormality Other: None IMPRESSION: 1. No CT evidence for acute intracranial abnormality. 2. Atrophy and small vessel ischemic changes of the white matter. Old left cerebellar infarct. Electronically Signed   By: Donavan Foil M.D.   On:  09/08/2017 23:25   Mr Angiogram Head Wo Contrast  Result Date: 09/09/2017 CLINICAL DATA:  Presyncope in shower. History of hypertension, hypercholesterolemia. EXAM: MRI HEAD WITHOUT CONTRAST MRA HEAD WITHOUT CONTRAST TECHNIQUE: Multiplanar, multiecho pulse sequences of the brain and surrounding structures were obtained without intravenous contrast. Angiographic images of the head were obtained using MRA technique without contrast. COMPARISON:  CT HEAD Sep 08, 2017 FINDINGS: MRI HEAD FINDINGS INTRACRANIAL CONTENTS: 2 subcentimeter reduced diffusion LEFT cerebellum with low ADC values. Old bilateral cerebellar infarcts. Ventricles and sulci are normal for patient's age. Patchy supratentorial white matter FLAIR T2 hyperintensities compatible with mild chronic small vessel ischemic disease, less than expected for age. Old LEFT thalamus lacunar infarct. Prominent basal ganglia perivascular spaces associated with chronic small vessel ischemic changes. No midline shift, mass effect or masses. No abnormal extra-axial fluid collections. VASCULAR: Normal major intracranial vascular flow voids present at skull base. SKULL AND UPPER CERVICAL SPINE: No abnormal sellar expansion. No suspicious calvarial bone marrow signal. Old appearing base of dens fracture with mild canal stenosis. SINUSES/ORBITS: Small mastoid effusions in trace paranasal sinus mucosal thickening.The included ocular globes and orbital contents are non-suspicious. Status post bilateral ocular lens implants. OTHER: 1 cm cyst RIGHT fossa of Rosenmller. MRA HEAD FINDINGS ANTERIOR CIRCULATION: Normal flow related enhancement of the included cervical, petrous, cavernous and supraclinoid internal carotid arteries. Patent anterior communicating artery. Patent anterior and middle cerebral arteries, including distal segments. No large vessel occlusion, flow limiting stenosis, aneurysm. POSTERIOR CIRCULATION: LEFT vertebral artery is dominant. Basilar artery is  patent, with normal flow related enhancement of the main branch vessels. Patent posterior cerebral arteries. Severe stenosis RIGHT P2 origin associated with tortuous segment resulting in flow artifact. No large vessel occlusion, aneurysm. ANATOMIC VARIANTS: None. Source images and MIP images were reviewed. IMPRESSION: MRI HEAD: 1. Two acute subcentimeter LEFT cerebellar nonhemorrhagic infarcts. 2. Old small cerebellar and LEFT thalamus infarcts. Mild chronic small vessel ischemic changes. 3. Old appearing base of dens fracture would be better characterized on NONEMERGENT CT cervical spine. MRA HEAD: 1. No emergent large vessel occlusion. 2. Severe stenosis RIGHT P2 segment may be overestimated by flow artifact. Electronically Signed   By: Elon Alas M.D.   On: 09/09/2017 02:58   Mr Brain Wo Contrast  Result Date: 09/09/2017 CLINICAL DATA:  Presyncope in shower. History of hypertension, hypercholesterolemia. EXAM: MRI HEAD WITHOUT CONTRAST MRA HEAD WITHOUT CONTRAST TECHNIQUE: Multiplanar, multiecho pulse sequences of the brain and surrounding structures were obtained without intravenous contrast. Angiographic images of the head were obtained using MRA technique without contrast. COMPARISON:  CT HEAD Sep 08, 2017 FINDINGS: MRI HEAD FINDINGS INTRACRANIAL CONTENTS: 2 subcentimeter reduced diffusion LEFT cerebellum with low ADC values. Old bilateral cerebellar infarcts. Ventricles and sulci are normal for patient's age. Patchy supratentorial white matter FLAIR T2 hyperintensities compatible with mild chronic small vessel ischemic disease, less than expected for age. Old LEFT thalamus lacunar infarct. Prominent basal ganglia perivascular spaces associated with chronic small vessel ischemic changes. No midline shift, mass effect or masses. No abnormal extra-axial fluid collections. VASCULAR: Normal major intracranial vascular flow voids present at skull base. SKULL AND UPPER CERVICAL  SPINE: No abnormal sellar  expansion. No suspicious calvarial bone marrow signal. Old appearing base of dens fracture with mild canal stenosis. SINUSES/ORBITS: Small mastoid effusions in trace paranasal sinus mucosal thickening.The included ocular globes and orbital contents are non-suspicious. Status post bilateral ocular lens implants. OTHER: 1 cm cyst RIGHT fossa of Rosenmller. MRA HEAD FINDINGS ANTERIOR CIRCULATION: Normal flow related enhancement of the included cervical, petrous, cavernous and supraclinoid internal carotid arteries. Patent anterior communicating artery. Patent anterior and middle cerebral arteries, including distal segments. No large vessel occlusion, flow limiting stenosis, aneurysm. POSTERIOR CIRCULATION: LEFT vertebral artery is dominant. Basilar artery is patent, with normal flow related enhancement of the main branch vessels. Patent posterior cerebral arteries. Severe stenosis RIGHT P2 origin associated with tortuous segment resulting in flow artifact. No large vessel occlusion, aneurysm. ANATOMIC VARIANTS: None. Source images and MIP images were reviewed. IMPRESSION: MRI HEAD: 1. Two acute subcentimeter LEFT cerebellar nonhemorrhagic infarcts. 2. Old small cerebellar and LEFT thalamus infarcts. Mild chronic small vessel ischemic changes. 3. Old appearing base of dens fracture would be better characterized on NONEMERGENT CT cervical spine. MRA HEAD: 1. No emergent large vessel occlusion. 2. Severe stenosis RIGHT P2 segment may be overestimated by flow artifact. Electronically Signed   By: Elon Alas M.D.   On: 09/09/2017 02:58   Ct Abdomen Pelvis W Contrast  Result Date: 09/08/2017 CLINICAL DATA:  Acute abdominal pain.  Nausea and vomiting. EXAM: CT ABDOMEN AND PELVIS WITH CONTRAST TECHNIQUE: Multidetector CT imaging of the abdomen and pelvis was performed using the standard protocol following bolus administration of intravenous contrast. CONTRAST:  12mL ISOVUE-370 IOPAMIDOL (ISOVUE-370) INJECTION 76%  COMPARISON:  CT 12/02/2014 FINDINGS: Lower chest: Minimal scarring at the right lung base. No pleural fluid. Hepatobiliary: Subcentimeter low-density lesion in the anterior left lobe of the liver is unchanged from prior exam. Diffuse hepatic steatosis with focal fatty sparing adjacent the gallbladder fossa. Gallbladder physiologically distended, no calcified stone. No biliary dilatation. Pancreas: No ductal dilatation or inflammation. Spleen: Normal in size without focal abnormality. Adrenals/Urinary Tract: Normal adrenal glands. No hydronephrosis. Minimal by oral perinephric edema, nonspecific. Homogeneous renal enhancement and symmetric excretion on delayed phase imaging. Urinary bladder is partially distended. Small bladder diverticulum at the dome. Stomach/Bowel: stomach physiologically distended, no abnormal gastric distension on the current exam. No small bowel dilatation, inflammation or obstruction. Appendix tentatively identified and normal. Colonic diverticulosis distally without acute diverticulitis. Vascular/Lymphatic: Aortic atherosclerosis. Ectatic distal aorta measuring 2.7 cm. Small central mesenteric nodes without pathologic adenopathy. Reproductive: Heterogeneous prostate gland spanning 5.1 cm transverse. Other: Bilateral fat containing inguinal hernias, slightly larger on the right. Minimal laxity of anterior abdominal wall musculature with small fat containing umbilical hernia. No free air, free fluid, or intra-abdominal fluid collection. Musculoskeletal: There are no acute or suspicious osseous abnormalities. IMPRESSION: 1. No acute abnormality in the abdomen/pelvis. 2. Colonic diverticulosis without diverticulitis. 3. Ectatic infrarenal abdominal aorta maximal dimension 2.7 cm at risk for aneurysm development. Recommend followup by ultrasound in 5 years. This recommendation follows ACR consensus guidelines: White Paper of the ACR Incidental Findings Committee II on Vascular Findings. J Am Coll  Radiol 2013; 10:789-794. Aortic Atherosclerosis (ICD10-I70.0). 4. Bilateral fat containing inguinal hernias. Mild hepatic steatosis. Electronically Signed   By: Jeb Levering M.D.   On: 09/08/2017 23:39   IMPRESSION AND PLAN:   A/P: 56M acute cerebellar CVA, lightheadedness/near-syncope, hypertensive urgency, hyperglycemia. -CVA pathway -Orthostatic VS -Neuro checks -Fall precautions -Neuro consult -ASA81 restarted -Simvastatin D/Ced, changed to high-intensity (Atorvastatin) -FSG qHS/AC -HbA1c -  Trop-I -Echo -MRI/MRA head done, neck not imaged, carotid U/S pending -P/T, SLP -BP ctrl, started Lisinopril -Tele, cardiac monitoring -c/w home meds -Cardiac diet -IVF -Lovenox -Full code -Admission, > 2 midnights  All the records are reviewed and case discussed with ED provider. Management plans discussed with the patient, family and they are in agreement.  CODE STATUS: Full code  TOTAL TIME TAKING CARE OF THIS PATIENT: 90 minutes.    Arta Silence M.D on 09/09/2017 at 3:31 AM  Between 7am to 6pm - Pager - [phone]  After 6pm go to [email or web address] - Proofreader  [facility]  Office  [phone]  CC: Primary care physician; [name], DO   Note: This dictation was prepared with Dragon dictation along with smaller phrase technology. Any transcriptional errors that result from this process are unintentional.

## 2017-09-09 NOTE — Progress Notes (Signed)
SLP Cancellation Note  Patient Details Name: SULLY DYMENT MRN: [id number] DOB: 04-29-1934   Cancelled treatment:       Reason Eval/Treat Not Completed: SLP screened, no needs identified, will sign off(chart reviewed; met w/ pt; consulted MD/NSG). Pt denied any difficulty swallowing and is currently on a regular diet; ate his breakfast well he said and tolerates swallowing pills w/ water per NSG report. Pt conversed at conversational level w/out any Aphasia or language deficits noted; pt denied any speech-language deficits but noted his tongue feels "a little thick". No OM weakness noted during general movements. Pt is Edentulous at baseline which impacts some of his articulation. Pt was 100% intelligible in general conversation w/ staff and making his wants/needs known; NSG and MD did not note any deficits. Educated pt on general articulation strategies including over-articulating, slowing down, and increasing volume(min) to increased articulation. Handout given. NSG/MD to reconsult if any change in status while admitted. Pt agreed.    Orinda Kenner, MS, CCC-SLP [facility] 09/09/2017, 10:57 AM

## 2017-09-09 NOTE — ED Notes (Signed)
ED Tech called and stated that pt is vomiting and they are unable to do the MRI. New order received from Dr Mable Paris for 8mg  zofran. This Rn to take to MRI.

## 2017-09-09 NOTE — Progress Notes (Signed)
NT [facility] notified via CCMD that pt's leads were off. Upon assessment, patient was found to be on the floor in front of the bathroom with his urinal in hand and urine on the floor. Wife at bedside. Pt assisted up x2 w/ gait belt and ambulated back to bed with front wheel walker. A&Ox4 and VSS. Floor mats put in place and bed alarm turned on to moderate setting; low bed to be ordered. MD Duane Boston notified, no new orders at this time. Nursing staff will continue to monitor for any changes in patient status. Earleen Reaper, RN

## 2017-09-09 NOTE — Progress Notes (Signed)
PT Cancellation Note  Patient Details Name: [name] MRN: [id number] DOB: Apr 10, 1935   Cancelled Treatment:    Reason Eval/Treat Not Completed: Patient at procedure or test/unavailable Attempted to see pt multiple time this date without being able to eval him.  Early this AM he was eating breakfast, later he was having a procedure in room with other service waiting to see him after, later nursing was placing another IV and reports that he will be going down later for CT.  Ultimately unable to see him this date and we will try back tomorrow as appropriate.    Kreg Shropshire, DPT 09/09/2017, 4:25 PM

## 2017-09-09 NOTE — Consult Note (Signed)
Referring Physician: Pyreddy    Chief Complaint: Weakness  HPI: [name] is an 83 y.o. male who reports that on yesterday afternoon he was drying off in the shower and bent over.  He felt very lightheaded as if he were goingt to pass out.  Felt very weak and he vomited twice.  Noticed imbalance with gait.  Presented to the ED for evaluation.   Initial NIHSS of 0.  Date last known well: Date: 09/08/2017 Time last known well: Time: 15:00 tPA Given: No: Outside time window  Past Medical History:  Diagnosis Date  . Bowel obstruction ([place])   . BPH (benign prostatic hypertrophy)   . Gastric outlet obstruction   . High cholesterol   . Hyperchloremia   . Hypertension   . Pancreatitis     Past Surgical History:  Procedure Laterality Date  . ESOPHAGOGASTRODUODENOSCOPY N/A 12/03/2014   Procedure: ESOPHAGOGASTRODUODENOSCOPY (EGD);  Surgeon: Hulen Luster, MD;  Location: [facility] ENDOSCOPY;  Service: Endoscopy;  Laterality: N/A;    Family History  Problem Relation Age of Onset  . Heart disease Mother   . Heart disease Father   . Heart disease Brother    Social History:  reports that he has quit smoking. He has never used smokeless tobacco. He reports that he does not drink alcohol or use drugs.  Allergies: No Known Allergies  Medications:  I have reviewed the patient's current medications. Prior to Admission:  Medications Prior to Admission  Medication Sig Dispense Refill Last Dose  . Apoaequorin (PREVAGEN EXTRA STRENGTH) 20 MG CAPS Take 20 mg by mouth daily. 90 capsule 3 09/08/2017 at Unknown time  . aspirin EC 81 MG tablet Take 81 mg by mouth daily.   09/08/2017 at Unknown time  . fluticasone (FLONASE) 50 MCG/ACT nasal spray Place 2 sprays into both nostrils daily. 16 g 0 09/08/2017 at Unknown time  . simvastatin (ZOCOR) 20 MG tablet Take 1 tablet (20 mg total) by mouth at bedtime. 90 tablet 3 09/07/2017 at Unknown time  . terazosin (HYTRIN) 10 MG capsule TAKE 1 CAPSULE AT BEDTIME 90  capsule 3 09/07/2017 at Unknown time   Scheduled: . aspirin EC  81 mg Oral Daily  . atorvastatin  40 mg Oral q1800  . enoxaparin (LOVENOX) injection  40 mg Subcutaneous Q24H  . lisinopril  5 mg Oral Daily  . sodium chloride flush  3 mL Intravenous Q12H  . terazosin  10 mg Oral QHS    ROS: History obtained from the patient  General ROS: negative for - chills, fatigue, fever, night sweats, weight gain or weight loss Psychological ROS: negative for - behavioral disorder, hallucinations, memory difficulties, mood swings or suicidal ideation Ophthalmic ROS: negative for - blurry vision, double vision, eye pain or loss of vision ENT ROS: negative for - epistaxis, nasal discharge, oral lesions, sore throat, tinnitus or vertigo Allergy and Immunology ROS: negative for - hives or itchy/watery eyes Hematological and Lymphatic ROS: negative for - bleeding problems, bruising or swollen lymph nodes Endocrine ROS: negative for - galactorrhea, hair pattern changes, polydipsia/polyuria or temperature intolerance Respiratory ROS: negative for - cough, hemoptysis, shortness of breath or wheezing Cardiovascular ROS: negative for - chest pain, dyspnea on exertion, edema or irregular heartbeat Gastrointestinal ROS: as noted in HPI Genito-Urinary ROS: negative for - dysuria, hematuria, incontinence or urinary frequency/urgency Musculoskeletal ROS: negative for - joint swelling or muscular weakness Neurological ROS: as noted in HPI Dermatological ROS: negative for rash and skin lesion changes  Physical Examination:  Blood pressure (!) 170/77, pulse (!) 55, temperature (!) 97.5 F (36.4 C), temperature source Oral, resp. rate 18, height 5\' 11"  (1.803 m), weight 105.9 kg (233 lb 8 oz), SpO2 96 %.  HEENT-  Normocephalic, no lesions, without obvious abnormality.  Normal external eye and conjunctiva.  Normal TM's bilaterally.  Normal auditory canals and external ears. Normal external nose, mucus membranes and  septum.  Normal pharynx. Cardiovascular- S1, S2 normal, pulses palpable throughout   Lungs- chest clear, no wheezing, rales, normal symmetric air entry Abdomen- soft, non-tender; bowel sounds normal; no masses,  no organomegaly Extremities- no edema Lymph-no adenopathy palpable Musculoskeletal-no joint tenderness, deformity or swelling Skin-warm and dry, no hyperpigmentation, vitiligo, or suspicious lesions  Neurological Examination   Mental Status: Alert, oriented, thought content appropriate.  Speech fluent without evidence of aphasia.  Able to follow 3 step commands without difficulty. Cranial Nerves: II: Discs flat bilaterally; Visual fields grossly normal, pupils equal, round, reactive to light and accommodation III,IV, VI: ptosis not present, extra-ocular motions intact bilaterally V,VII: smile symmetric, facial light touch sensation normal bilaterally VIII: hearing normal bilaterally IX,X: gag reflex present XI: bilateral shoulder shrug XII: midline tongue extension Motor: Right : Upper extremity   5/5    Left:     Upper extremity   5/5  Lower extremity   5/5     Lower extremity   5/5 Tone and bulk:normal tone throughout; no atrophy noted Sensory: Pinprick and light touch intact throughout, bilaterally Deep Tendon Reflexes: 2+ in the upper extremities and absent in the lower extremities Plantars: Right: upgoing   Left: downgoing Cerebellar: Dysmetria with finger-to-nose testing on the left upper extremity and heel to shin on the right lower extremity Gait: not tested due to safety concerns    Laboratory Studies:  Basic Metabolic Panel: Recent Labs  Lab 09/08/17 2120  NA 136  K 4.2  CL 99*  CO2 30  GLUCOSE 134*  BUN 22*  CREATININE 1.01  CALCIUM 8.5*    Liver Function Tests: Recent Labs  Lab 09/08/17 2120  AST 25  ALT 20  ALKPHOS 65  BILITOT 0.8  PROT 7.8  ALBUMIN 4.0   No results for input(s): LIPASE, AMYLASE in the last 168 hours. No results for  input(s): AMMONIA in the last 168 hours.  CBC: Recent Labs  Lab 09/08/17 2120  WBC 7.0  HGB 14.9  HCT 42.8  MCV 95.0  PLT 183    Cardiac Enzymes: Recent Labs  Lab 09/08/17 2120 09/09/17 0429 09/09/17 0701  TROPONINI <0.03 <0.03 <0.03    BNP: Invalid input(s): POCBNP  CBG: Recent Labs  Lab 09/09/17 0748 09/09/17 1201  GLUCAP 135* 138*    Microbiology: Results for orders placed or performed in visit on 10/06/16  Microscopic Examination     Status: Abnormal   Collection Time: 10/06/16  3:34 PM  Result Value Ref Range Status   WBC, UA 0-5 0 - 5 /hpf Final   RBC, UA None seen 0 - 2 /hpf Final   Epithelial Cells (non renal) 0-10 0 - 10 /hpf Final   Bacteria, UA Few (A) None seen/Few Final    Coagulation Studies: No results for input(s): LABPROT, INR in the last 72 hours.  Urinalysis:  Recent Labs  Lab 09/08/17 2227  COLORURINE YELLOW*  LABSPEC 1.013  PHURINE 8.0  GLUCOSEU 150*  HGBUR NEGATIVE  BILIRUBINUR NEGATIVE  KETONESUR 5*  PROTEINUR NEGATIVE  NITRITE NEGATIVE  LEUKOCYTESUR NEGATIVE    Lipid Panel:  Component Value Date/Time   CHOL 218 (H) 08/31/2017 0800   TRIG 163 (H) 08/31/2017 0800   HDL 44 08/31/2017 0800   CHOLHDL 5.0 (H) 08/31/2017 0800   VLDL 21 07/20/2016 0001   LDLCALC 144 (H) 08/31/2017 0800    HgbA1C:  Lab Results  Component Value Date   HGBA1C 5.4 09/09/2017    Urine Drug Screen:  No results found for: LABOPIA, COCAINSCRNUR, LABBENZ, AMPHETMU, THCU, LABBARB  Alcohol Level: No results for input(s): ETH in the last 168 hours.  Other results: EKG: sinus bradycardia at 54 bpm.  Imaging: Ct Head Wo Contrast  Result Date: 09/08/2017 CLINICAL DATA:  Altered LOC EXAM: CT HEAD WITHOUT CONTRAST TECHNIQUE: Contiguous axial images were obtained from the base of the skull through the vertex without intravenous contrast. COMPARISON:  None. FINDINGS: Brain: No acute territorial infarction, hemorrhage, or intracranial mass. Old  left cerebellar infarct. Mild to moderate atrophy. Mild small vessel ischemic changes of the white matter. Slightly prominent ventricles, felt related to atrophy. Vascular: No hyperdense vessels.  Carotid vascular calcification Skull: Normal. Negative for fracture or focal lesion. Left mastoid sclerosis. Small amount of fluid in the inferior mastoids bilaterally. Sinuses/Orbits: Mild mucosal thickening in the maxillary and ethmoid sinuses. No acute orbital abnormality Other: None IMPRESSION: 1. No CT evidence for acute intracranial abnormality. 2. Atrophy and small vessel ischemic changes of the white matter. Old left cerebellar infarct. Electronically Signed   By: Donavan Foil M.D.   On: 09/08/2017 23:25   Mr Angiogram Head Wo Contrast  Result Date: 09/09/2017 CLINICAL DATA:  Presyncope in shower. History of hypertension, hypercholesterolemia. EXAM: MRI HEAD WITHOUT CONTRAST MRA HEAD WITHOUT CONTRAST TECHNIQUE: Multiplanar, multiecho pulse sequences of the brain and surrounding structures were obtained without intravenous contrast. Angiographic images of the head were obtained using MRA technique without contrast. COMPARISON:  CT HEAD Sep 08, 2017 FINDINGS: MRI HEAD FINDINGS INTRACRANIAL CONTENTS: 2 subcentimeter reduced diffusion LEFT cerebellum with low ADC values. Old bilateral cerebellar infarcts. Ventricles and sulci are normal for patient's age. Patchy supratentorial white matter FLAIR T2 hyperintensities compatible with mild chronic small vessel ischemic disease, less than expected for age. Old LEFT thalamus lacunar infarct. Prominent basal ganglia perivascular spaces associated with chronic small vessel ischemic changes. No midline shift, mass effect or masses. No abnormal extra-axial fluid collections. VASCULAR: Normal major intracranial vascular flow voids present at skull base. SKULL AND UPPER CERVICAL SPINE: No abnormal sellar expansion. No suspicious calvarial bone marrow signal. Old appearing  base of dens fracture with mild canal stenosis. SINUSES/ORBITS: Small mastoid effusions in trace paranasal sinus mucosal thickening.The included ocular globes and orbital contents are non-suspicious. Status post bilateral ocular lens implants. OTHER: 1 cm cyst RIGHT fossa of Rosenmller. MRA HEAD FINDINGS ANTERIOR CIRCULATION: Normal flow related enhancement of the included cervical, petrous, cavernous and supraclinoid internal carotid arteries. Patent anterior communicating artery. Patent anterior and middle cerebral arteries, including distal segments. No large vessel occlusion, flow limiting stenosis, aneurysm. POSTERIOR CIRCULATION: LEFT vertebral artery is dominant. Basilar artery is patent, with normal flow related enhancement of the main branch vessels. Patent posterior cerebral arteries. Severe stenosis RIGHT P2 origin associated with tortuous segment resulting in flow artifact. No large vessel occlusion, aneurysm. ANATOMIC VARIANTS: None. Source images and MIP images were reviewed. IMPRESSION: MRI HEAD: 1. Two acute subcentimeter LEFT cerebellar nonhemorrhagic infarcts. 2. Old small cerebellar and LEFT thalamus infarcts. Mild chronic small vessel ischemic changes. 3. Old appearing base of dens fracture would be better characterized on NONEMERGENT CT  cervical spine. MRA HEAD: 1. No emergent large vessel occlusion. 2. Severe stenosis RIGHT P2 segment may be overestimated by flow artifact. Electronically Signed   By: Elon Alas M.D.   On: 09/09/2017 02:58   Mr Brain Wo Contrast  Result Date: 09/09/2017 CLINICAL DATA:  Presyncope in shower. History of hypertension, hypercholesterolemia. EXAM: MRI HEAD WITHOUT CONTRAST MRA HEAD WITHOUT CONTRAST TECHNIQUE: Multiplanar, multiecho pulse sequences of the brain and surrounding structures were obtained without intravenous contrast. Angiographic images of the head were obtained using MRA technique without contrast. COMPARISON:  CT HEAD Sep 08, 2017 FINDINGS:  MRI HEAD FINDINGS INTRACRANIAL CONTENTS: 2 subcentimeter reduced diffusion LEFT cerebellum with low ADC values. Old bilateral cerebellar infarcts. Ventricles and sulci are normal for patient's age. Patchy supratentorial white matter FLAIR T2 hyperintensities compatible with mild chronic small vessel ischemic disease, less than expected for age. Old LEFT thalamus lacunar infarct. Prominent basal ganglia perivascular spaces associated with chronic small vessel ischemic changes. No midline shift, mass effect or masses. No abnormal extra-axial fluid collections. VASCULAR: Normal major intracranial vascular flow voids present at skull base. SKULL AND UPPER CERVICAL SPINE: No abnormal sellar expansion. No suspicious calvarial bone marrow signal. Old appearing base of dens fracture with mild canal stenosis. SINUSES/ORBITS: Small mastoid effusions in trace paranasal sinus mucosal thickening.The included ocular globes and orbital contents are non-suspicious. Status post bilateral ocular lens implants. OTHER: 1 cm cyst RIGHT fossa of Rosenmller. MRA HEAD FINDINGS ANTERIOR CIRCULATION: Normal flow related enhancement of the included cervical, petrous, cavernous and supraclinoid internal carotid arteries. Patent anterior communicating artery. Patent anterior and middle cerebral arteries, including distal segments. No large vessel occlusion, flow limiting stenosis, aneurysm. POSTERIOR CIRCULATION: LEFT vertebral artery is dominant. Basilar artery is patent, with normal flow related enhancement of the main branch vessels. Patent posterior cerebral arteries. Severe stenosis RIGHT P2 origin associated with tortuous segment resulting in flow artifact. No large vessel occlusion, aneurysm. ANATOMIC VARIANTS: None. Source images and MIP images were reviewed. IMPRESSION: MRI HEAD: 1. Two acute subcentimeter LEFT cerebellar nonhemorrhagic infarcts. 2. Old small cerebellar and LEFT thalamus infarcts. Mild chronic small vessel ischemic  changes. 3. Old appearing base of dens fracture would be better characterized on NONEMERGENT CT cervical spine. MRA HEAD: 1. No emergent large vessel occlusion. 2. Severe stenosis RIGHT P2 segment may be overestimated by flow artifact. Electronically Signed   By: Elon Alas M.D.   On: 09/09/2017 02:58   Ct Abdomen Pelvis W Contrast  Result Date: 09/08/2017 CLINICAL DATA:  Acute abdominal pain.  Nausea and vomiting. EXAM: CT ABDOMEN AND PELVIS WITH CONTRAST TECHNIQUE: Multidetector CT imaging of the abdomen and pelvis was performed using the standard protocol following bolus administration of intravenous contrast. CONTRAST:  32mL ISOVUE-370 IOPAMIDOL (ISOVUE-370) INJECTION 76% COMPARISON:  CT 12/02/2014 FINDINGS: Lower chest: Minimal scarring at the right lung base. No pleural fluid. Hepatobiliary: Subcentimeter low-density lesion in the anterior left lobe of the liver is unchanged from prior exam. Diffuse hepatic steatosis with focal fatty sparing adjacent the gallbladder fossa. Gallbladder physiologically distended, no calcified stone. No biliary dilatation. Pancreas: No ductal dilatation or inflammation. Spleen: Normal in size without focal abnormality. Adrenals/Urinary Tract: Normal adrenal glands. No hydronephrosis. Minimal by oral perinephric edema, nonspecific. Homogeneous renal enhancement and symmetric excretion on delayed phase imaging. Urinary bladder is partially distended. Small bladder diverticulum at the dome. Stomach/Bowel: stomach physiologically distended, no abnormal gastric distension on the current exam. No small bowel dilatation, inflammation or obstruction. Appendix tentatively identified and normal. Colonic diverticulosis  distally without acute diverticulitis. Vascular/Lymphatic: Aortic atherosclerosis. Ectatic distal aorta measuring 2.7 cm. Small central mesenteric nodes without pathologic adenopathy. Reproductive: Heterogeneous prostate gland spanning 5.1 cm transverse. Other:  Bilateral fat containing inguinal hernias, slightly larger on the right. Minimal laxity of anterior abdominal wall musculature with small fat containing umbilical hernia. No free air, free fluid, or intra-abdominal fluid collection. Musculoskeletal: There are no acute or suspicious osseous abnormalities. IMPRESSION: 1. No acute abnormality in the abdomen/pelvis. 2. Colonic diverticulosis without diverticulitis. 3. Ectatic infrarenal abdominal aorta maximal dimension 2.7 cm at risk for aneurysm development. Recommend followup by ultrasound in 5 years. This recommendation follows ACR consensus guidelines: White Paper of the ACR Incidental Findings Committee II on Vascular Findings. J Am Coll Radiol 2013; 10:789-794. Aortic Atherosclerosis (ICD10-I70.0). 4. Bilateral fat containing inguinal hernias. Mild hepatic steatosis. Electronically Signed   By: Jeb Levering M.D.   On: 09/08/2017 23:39    Assessment: 83 y.o. male who presents with gait imbalance.  MRI of the brain reviewed and shows two small left cerebellar infarcts and chronic left cerebellar and thalamic infarcts.  Likely secondary to small vessel disease.  Severe stenosis of the right P2 is noted.  Carotid dopplers pending.  Echocardiogram shows no cardiac source of emboli with an EF of 60-65%.  A1c 5.4, LDL 144.  On no antiplatelet therapy at home.    Stroke Risk Factors - hyperlipidemia and hypertension  Plan: 1. Aggressive lipid management with target LDL<70. 2. With all ischemic disease in the posterior circulation will investigate further with CTA of the neck.   3. PT consult, OT consult, Speech consult 4. Carotid dopplers pending 5. Prophylactic therapy-Antiplatelet med: Aspirin - dose ASA 81mg , Plavix 75mg  daily 6. NPO until RN stroke swallow screen 7. Telemetry monitoring 8. Frequent neuro checks   Alexis Goodell, MD Neurology [phone] 09/09/2017, 2:13 PM

## 2017-09-10 ENCOUNTER — Inpatient Hospital Stay: Payer: Medicare Other

## 2017-09-10 DIAGNOSIS — M6281 Muscle weakness (generalized): Secondary | ICD-10-CM

## 2017-09-10 DIAGNOSIS — I6509 Occlusion and stenosis of unspecified vertebral artery: Secondary | ICD-10-CM

## 2017-09-10 DIAGNOSIS — I10 Essential (primary) hypertension: Secondary | ICD-10-CM

## 2017-09-10 DIAGNOSIS — I63212 Cerebral infarction due to unspecified occlusion or stenosis of left vertebral arteries: Principal | ICD-10-CM

## 2017-09-10 LAB — GLUCOSE, CAPILLARY
GLUCOSE-CAPILLARY: 160 mg/dL — AB (ref 65–99)
GLUCOSE-CAPILLARY: 123 mg/dL — AB (ref 65–99)
GLUCOSE-CAPILLARY: 125 mg/dL — AB (ref 65–99)
Glucose-Capillary: 145 mg/dL — ABNORMAL HIGH (ref 65–99)

## 2017-09-10 MED ORDER — OXYCODONE HCL 5 MG PO TABS: 5.0000 mg | ORAL_TABLET | ORAL | Status: DC | PRN

## 2017-09-10 NOTE — Consult Note (Signed)
ORTHOPAEDIC CONSULTATION  REQUESTING PHYSICIAN: Loletha Grayer, MD  Chief Complaint: Left shoulder pain s/p fall  HPI: [name] is a 83 y.o. male who complains of left shoulder pain status post fall.  Patient is reported to have fallen overnight in the hospital while trying to urinate.  Orthopedics is consulted for possible fracture of the left proximal humerus.  Patient denies any numbness or tingling.  He is complaining of left-sided chest pain from impact onto his left side during the fall.  Past Medical History:  Diagnosis Date  . Bowel obstruction ([place])   . BPH (benign prostatic hypertrophy)   . Gastric outlet obstruction   . High cholesterol   . Hyperchloremia   . Hypertension   . Pancreatitis    Past Surgical History:  Procedure Laterality Date  . ESOPHAGOGASTRODUODENOSCOPY N/A 12/03/2014   Procedure: ESOPHAGOGASTRODUODENOSCOPY (EGD);  Surgeon: Hulen Luster, MD;  Location: [facility] ENDOSCOPY;  Service: Endoscopy;  Laterality: N/A;   Social History   Socioeconomic History  . Marital status: Married    Spouse name: Not on file  . Number of children: Not on file  . Years of education: Not on file  . Highest education level: Not on file  Occupational History  . Not on file  Social Needs  . Financial resource strain: Not hard at all  . Food insecurity:    Worry: Never true    Inability: Never true  . Transportation needs:    Medical: No    Non-medical: No  Tobacco Use  . Smoking status: Former Research scientist (life sciences)  . Smokeless tobacco: Never Used  Substance and Sexual Activity  . Alcohol use: No  . Drug use: No  . Sexual activity: Never  Lifestyle  . Physical activity:    Days per week: 0 days    Minutes per session: 0 min  . Stress: Not at all  Relationships  . Social connections:    Talks on phone: Once a week    Gets together: More than three times a week    Attends religious service: More than 4 times per year    Active member of club or organization: No    Attends  meetings of clubs or organizations: Never    Relationship status: Married  Other Topics Concern  . Not on file  Social History Narrative  . Not on file   Family History  Problem Relation Age of Onset  . Heart disease Mother   . Heart disease Father   . Heart disease Brother    No Known Allergies Prior to Admission medications   Medication Sig Start Date End Date Taking? Authorizing Provider  Apoaequorin (PREVAGEN EXTRA STRENGTH) 20 MG CAPS Take 20 mg by mouth daily. 04/10/16  Yes Karamalegos, Devonne Doughty, DO  aspirin EC 81 MG tablet Take 81 mg by mouth daily.   Yes [provider]  fluticasone (FLONASE) 50 MCG/ACT nasal spray Place 2 sprays into both nostrils daily. 07/25/16  Yes Melynda Ripple, MD  simvastatin (ZOCOR) 20 MG tablet Take 1 tablet (20 mg total) by mouth at bedtime. 09/08/17  Yes Karamalegos, Devonne Doughty, DO  terazosin (HYTRIN) 10 MG capsule TAKE 1 CAPSULE AT BEDTIME 01/05/17  Yes [name], DO   Dg Chest 2 View  Result Date: 09/10/2017 CLINICAL DATA:  Recent fall with chest pain, initial encounter EXAM: CHEST - 2 VIEW COMPARISON:  None. FINDINGS: Cardiac shadow is within normal limits. Mild aortic calcifications are seen. The lungs are well aerated bilaterally without  focal infiltrate or effusion. No pneumothorax is seen. No acute bony abnormality is noted. IMPRESSION: No active cardiopulmonary disease. Electronically Signed   By: Inez Catalina M.D.   On: 09/10/2017 08:01   Dg Ribs Unilateral Left  Result Date: 09/10/2017 CLINICAL DATA:  Left rib pain after fall last night. EXAM: LEFT RIBS - 2 VIEW COMPARISON:  None. FINDINGS: No fracture or other bone lesions are seen involving the ribs. IMPRESSION: Normal left ribs. Electronically Signed   By: Marijo Conception, M.D.   On: 09/10/2017 12:25   Ct Head Wo Contrast  Result Date: 09/08/2017 CLINICAL DATA:  Altered LOC EXAM: CT HEAD WITHOUT CONTRAST TECHNIQUE: Contiguous axial images were obtained from  the base of the skull through the vertex without intravenous contrast. COMPARISON:  None. FINDINGS: Brain: No acute territorial infarction, hemorrhage, or intracranial mass. Old left cerebellar infarct. Mild to moderate atrophy. Mild small vessel ischemic changes of the white matter. Slightly prominent ventricles, felt related to atrophy. Vascular: No hyperdense vessels.  Carotid vascular calcification Skull: Normal. Negative for fracture or focal lesion. Left mastoid sclerosis. Small amount of fluid in the inferior mastoids bilaterally. Sinuses/Orbits: Mild mucosal thickening in the maxillary and ethmoid sinuses. No acute orbital abnormality Other: None IMPRESSION: 1. No CT evidence for acute intracranial abnormality. 2. Atrophy and small vessel ischemic changes of the white matter. Old left cerebellar infarct. Electronically Signed   By: Donavan Foil M.D.   On: 09/08/2017 23:25   Ct Angio Neck W Or Wo Contrast  Addendum Date: 09/09/2017   ADDENDUM REPORT: 09/09/2017 17:40 ADDENDUM: These results were called by telephone at the time of interpretation on 09/09/2017 at 5:40 pm to Dr. Estanislado Pandy , who verbally acknowledged these results. Electronically Signed   By: Ulyses Jarred M.D.   On: 09/09/2017 17:40   Result Date: 09/09/2017 CLINICAL DATA:  Recent stroke. EXAM: CT ANGIOGRAPHY NECK TECHNIQUE: Multidetector CT imaging of the neck was performed using the standard protocol during bolus administration of intravenous contrast. Multiplanar CT image reconstructions and MIPs were obtained to evaluate the vascular anatomy. Carotid stenosis measurements (when applicable) are obtained utilizing NASCET criteria, using the distal internal carotid diameter as the denominator. CONTRAST:  67mL ISOVUE-370 IOPAMIDOL (ISOVUE-370) INJECTION 76% COMPARISON:  MRA brain 09/09/2017 FINDINGS: Aortic arch: There is mild calcific atherosclerosis of the aortic arch. There is no aneurysm, dissection or hemodynamically significant  stenosis of the visualized ascending aorta and aortic arch. Conventional 3 vessel aortic branching pattern. The visualized proximal subclavian arteries are widely patent. Right carotid system: --Common carotid artery: Widely patent origin without common carotid artery dissection or aneurysm. --Internal carotid artery: No dissection, occlusion or aneurysm. No hemodynamically significant stenosis. --External carotid artery: No acute abnormality. Left carotid system: --Common carotid artery: Widely patent origin without common carotid artery dissection or aneurysm. --Internal carotid artery:No dissection, occlusion or aneurysm. No hemodynamically significant stenosis. --External carotid artery: No acute abnormality. Vertebral arteries: Right dominant configuration. Both origins are normal. The left vertebral artery is occluded at the proximal V3 segment. The right vertebral artery is normal. Skeleton: There is no bony spinal canal stenosis. No lytic or blastic lesion. Other neck: Normal pharynx, larynx and major salivary glands. No cervical lymphadenopathy. Unremarkable thyroid gland. Upper chest: No pneumothorax or pleural effusion. No nodules or masses. Review of the MIP images confirms the above findings IMPRESSION: 1. 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 2. Otherwise normal CTA of the neck. Electronically Signed: By: Lennette Bihari  Collins [place] M.D. On: 09/09/2017 17:08   Mr Angiogram Head Wo Contrast  Result Date: 09/09/2017 CLINICAL DATA:  Presyncope in shower. History of hypertension, hypercholesterolemia. EXAM: MRI HEAD WITHOUT CONTRAST MRA HEAD WITHOUT CONTRAST TECHNIQUE: Multiplanar, multiecho pulse sequences of the brain and surrounding structures were obtained without intravenous contrast. Angiographic images of the head were obtained using MRA technique without contrast. COMPARISON:  CT HEAD Sep 08, 2017 FINDINGS: MRI HEAD FINDINGS INTRACRANIAL CONTENTS: 2 subcentimeter  reduced diffusion LEFT cerebellum with low ADC values. Old bilateral cerebellar infarcts. Ventricles and sulci are normal for patient's age. Patchy supratentorial white matter FLAIR T2 hyperintensities compatible with mild chronic small vessel ischemic disease, less than expected for age. Old LEFT thalamus lacunar infarct. Prominent basal ganglia perivascular spaces associated with chronic small vessel ischemic changes. No midline shift, mass effect or masses. No abnormal extra-axial fluid collections. VASCULAR: Normal major intracranial vascular flow voids present at skull base. SKULL AND UPPER CERVICAL SPINE: No abnormal sellar expansion. No suspicious calvarial bone marrow signal. Old appearing base of dens fracture with mild canal stenosis. SINUSES/ORBITS: Small mastoid effusions in trace paranasal sinus mucosal thickening.The included ocular globes and orbital contents are non-suspicious. Status post bilateral ocular lens implants. OTHER: 1 cm cyst RIGHT fossa of Rosenmller. MRA HEAD FINDINGS ANTERIOR CIRCULATION: Normal flow related enhancement of the included cervical, petrous, cavernous and supraclinoid internal carotid arteries. Patent anterior communicating artery. Patent anterior and middle cerebral arteries, including distal segments. No large vessel occlusion, flow limiting stenosis, aneurysm. POSTERIOR CIRCULATION: LEFT vertebral artery is dominant. Basilar artery is patent, with normal flow related enhancement of the main branch vessels. Patent posterior cerebral arteries. Severe stenosis RIGHT P2 origin associated with tortuous segment resulting in flow artifact. No large vessel occlusion, aneurysm. ANATOMIC VARIANTS: None. Source images and MIP images were reviewed. IMPRESSION: MRI HEAD: 1. Two acute subcentimeter LEFT cerebellar nonhemorrhagic infarcts. 2. Old small cerebellar and LEFT thalamus infarcts. Mild chronic small vessel ischemic changes. 3. Old appearing base of dens fracture would be  better characterized on NONEMERGENT CT cervical spine. MRA HEAD: 1. No emergent large vessel occlusion. 2. Severe stenosis RIGHT P2 segment may be overestimated by flow artifact. Electronically Signed   By: Elon Alas M.D.   On: 09/09/2017 02:58   Mr Brain Wo Contrast  Result Date: 09/09/2017 CLINICAL DATA:  Presyncope in shower. History of hypertension, hypercholesterolemia. EXAM: MRI HEAD WITHOUT CONTRAST MRA HEAD WITHOUT CONTRAST TECHNIQUE: Multiplanar, multiecho pulse sequences of the brain and surrounding structures were obtained without intravenous contrast. Angiographic images of the head were obtained using MRA technique without contrast. COMPARISON:  CT HEAD Sep 08, 2017 FINDINGS: MRI HEAD FINDINGS INTRACRANIAL CONTENTS: 2 subcentimeter reduced diffusion LEFT cerebellum with low ADC values. Old bilateral cerebellar infarcts. Ventricles and sulci are normal for patient's age. Patchy supratentorial white matter FLAIR T2 hyperintensities compatible with mild chronic small vessel ischemic disease, less than expected for age. Old LEFT thalamus lacunar infarct. Prominent basal ganglia perivascular spaces associated with chronic small vessel ischemic changes. No midline shift, mass effect or masses. No abnormal extra-axial fluid collections. VASCULAR: Normal major intracranial vascular flow voids present at skull base. SKULL AND UPPER CERVICAL SPINE: No abnormal sellar expansion. No suspicious calvarial bone marrow signal. Old appearing base of dens fracture with mild canal stenosis. SINUSES/ORBITS: Small mastoid effusions in trace paranasal sinus mucosal thickening.The included ocular globes and orbital contents are non-suspicious. Status post bilateral ocular lens implants. OTHER: 1 cm cyst RIGHT fossa of Rosenmller. MRA HEAD FINDINGS ANTERIOR CIRCULATION:  Normal flow related enhancement of the included cervical, petrous, cavernous and supraclinoid internal carotid arteries. Patent anterior  communicating artery. Patent anterior and middle cerebral arteries, including distal segments. No large vessel occlusion, flow limiting stenosis, aneurysm. POSTERIOR CIRCULATION: LEFT vertebral artery is dominant. Basilar artery is patent, with normal flow related enhancement of the main branch vessels. Patent posterior cerebral arteries. Severe stenosis RIGHT P2 origin associated with tortuous segment resulting in flow artifact. No large vessel occlusion, aneurysm. ANATOMIC VARIANTS: None. Source images and MIP images were reviewed. IMPRESSION: MRI HEAD: 1. Two acute subcentimeter LEFT cerebellar nonhemorrhagic infarcts. 2. Old small cerebellar and LEFT thalamus infarcts. Mild chronic small vessel ischemic changes. 3. Old appearing base of dens fracture would be better characterized on NONEMERGENT CT cervical spine. MRA HEAD: 1. No emergent large vessel occlusion. 2. Severe stenosis RIGHT P2 segment may be overestimated by flow artifact. Electronically Signed   By: Elon Alas M.D.   On: 09/09/2017 02:58   Ct Abdomen Pelvis W Contrast  Result Date: 09/08/2017 CLINICAL DATA:  Acute abdominal pain.  Nausea and vomiting. EXAM: CT ABDOMEN AND PELVIS WITH CONTRAST TECHNIQUE: Multidetector CT imaging of the abdomen and pelvis was performed using the standard protocol following bolus administration of intravenous contrast. CONTRAST:  50mL ISOVUE-370 IOPAMIDOL (ISOVUE-370) INJECTION 76% COMPARISON:  CT 12/02/2014 FINDINGS: Lower chest: Minimal scarring at the right lung base. No pleural fluid. Hepatobiliary: Subcentimeter low-density lesion in the anterior left lobe of the liver is unchanged from prior exam. Diffuse hepatic steatosis with focal fatty sparing adjacent the gallbladder fossa. Gallbladder physiologically distended, no calcified stone. No biliary dilatation. Pancreas: No ductal dilatation or inflammation. Spleen: Normal in size without focal abnormality. Adrenals/Urinary Tract: Normal adrenal glands.  No hydronephrosis. Minimal by oral perinephric edema, nonspecific. Homogeneous renal enhancement and symmetric excretion on delayed phase imaging. Urinary bladder is partially distended. Small bladder diverticulum at the dome. Stomach/Bowel: stomach physiologically distended, no abnormal gastric distension on the current exam. No small bowel dilatation, inflammation or obstruction. Appendix tentatively identified and normal. Colonic diverticulosis distally without acute diverticulitis. Vascular/Lymphatic: Aortic atherosclerosis. Ectatic distal aorta measuring 2.7 cm. Small central mesenteric nodes without pathologic adenopathy. Reproductive: Heterogeneous prostate gland spanning 5.1 cm transverse. Other: Bilateral fat containing inguinal hernias, slightly larger on the right. Minimal laxity of anterior abdominal wall musculature with small fat containing umbilical hernia. No free air, free fluid, or intra-abdominal fluid collection. Musculoskeletal: There are no acute or suspicious osseous abnormalities. IMPRESSION: 1. No acute abnormality in the abdomen/pelvis. 2. Colonic diverticulosis without diverticulitis. 3. Ectatic infrarenal abdominal aorta maximal dimension 2.7 cm at risk for aneurysm development. Recommend followup by ultrasound in 5 years. This recommendation follows ACR consensus guidelines: White Paper of the ACR Incidental Findings Committee II on Vascular Findings. J Am Coll Radiol 2013; 10:789-794. Aortic Atherosclerosis (ICD10-I70.0). 4. Bilateral fat containing inguinal hernias. Mild hepatic steatosis. Electronically Signed   By: Jeb Levering M.D.   On: 09/08/2017 23:39   US Carotid Bilateral  Result Date: 09/09/2017 CLINICAL DATA:  83 year old male with acute left cerebellar infarcts EXAM: BILATERAL CAROTID DUPLEX ULTRASOUND TECHNIQUE: Pearline Cables scale imaging, color Doppler and duplex ultrasound were performed of bilateral carotid and vertebral arteries in the neck. COMPARISON:  Brain MRI  09/09/2017 FINDINGS: Criteria: Quantification of carotid stenosis is based on velocity parameters that correlate the residual internal carotid diameter with NASCET-based stenosis levels, using the diameter of the distal internal carotid lumen as the denominator for stenosis measurement. The following velocity measurements were obtained: RIGHT ICA: 77/16  cm/sec CCA: 18/29 cm/sec SYSTOLIC ICA/CCA RATIO:  0.8 ECA:  93 cm/sec LEFT ICA: 112/20 cm/sec CCA: 937/16 cm/sec SYSTOLIC ICA/CCA RATIO:  0.9 ECA:  137 cm/sec RIGHT CAROTID ARTERY: No significant atherosclerotic plaque or evidence of stenosis. RIGHT VERTEBRAL ARTERY:  Patent with normal antegrade flow. LEFT CAROTID ARTERY: No significant atherosclerotic plaque or evidence of stenosis. LEFT VERTEBRAL ARTERY:  Patent with normal antegrade flow. IMPRESSION: Negative examination. No significant atherosclerotic plaque or evidence of stenosis. Signed, Criselda Peaches, MD [facility] Electronically Signed   By: Jacqulynn Cadet M.D.   On: 09/09/2017 14:23   Dg Shoulder Left  Result Date: 09/10/2017 CLINICAL DATA:  Recent fall with left shoulder pain, initial encounter EXAM: LEFT SHOULDER - 2+ VIEW COMPARISON:  None. FINDINGS: Humeral head is well seated. Multiple small bony fragments are identified adjacent to the medial aspect of the humeral head although a complete fracture is not identified. This may be related to some focal impaction. The need for further evaluation can be determined on a clinical basis. IMPRESSION: Multiple small fracture fragments adjacent to the humeral head likely related to some impaction injury. No complete transverse fracture is identified. Cross-sectional imaging may be helpful as indicated. Electronically Signed   By: Inez Catalina M.D.   On: 09/10/2017 08:06    Positive ROS: All other systems have been reviewed and were otherwise negative with the exception of those  mentioned in the HPI and as above.  Physical Exam: General: Alert, no acute distress  MUSCULOSKELETAL: Left shoulder: Patient's skin is intact.  There is no erythema ecchymosis or deformity seen.  He can flex and extend all 5 digits of the left hand and has intact sensation to light touch.  He has full wrist and elbow range of motion.  Patient can abduct his shoulder to approximately 80 degrees with discomfort.  He had no detectable weakness to internal or external rotation of the left shoulder with his arm by his side or with shoulder abduction.  Patient had minimal tenderness to palpation over the left shoulder.   Assessment: Left shoulder pain status post fall  Plan: I reviewed the patient's x-rays.  There is calcification anterior to the left proximal humerus which may indicate an avulsion fracture possibly of the lesser tuberosity.  I recommended the patient wear a sling on his left upper extremity.  I am ordering a CT scan of the left shoulder to further evaluate his injury.  Patient will not require any immediate surgical intervention during this hospitalization.  I will follow-up on the patient's CT results but he will wear a sling on his left arm until he follows up in my office in approximately 2 weeks for re-evaluation.    Thornton Park, MD    09/10/2017 1:46 PM

## 2017-09-10 NOTE — Consult Note (Signed)
[facility] Consult Note  MRN : [id number]  [name] is a 83 y.o. ([birth year]) male who presents with chief complaint of  Chief Complaint  Patient presents with  . Emesis  . Weakness   History of Present Illness:  The patient is an 83 year old male with a past medical history of hyperlipidemia, hypertension, memory loss, lower extremity edema, chronic kidney disease stage III who presented to the [facility] ED with generalized weakness and difficulty with balance.  Patient notes that upon arriving to the emergency department he was nauseous and lightheaded however denied any vomiting or fever.  Patient notes that his weakness has been progressive over the last year and a half.  Patient denies any claudication-like symptoms, rest pain or ulceration to the bilateral lower extremity.  Patient denies any visual disturbances or headaches.  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 Otherwise normal CTA of the neck.  Vascular surgery consulted by Dr. Estanislado Pandy for further recommendations.  Current Facility-Administered Medications  Medication Dose Route Frequency Provider Last Rate Last Dose  . acetaminophen (TYLENOL) tablet 650 mg  650 mg Oral Q6H PRN Arta Silence, MD       Or  . acetaminophen (TYLENOL) suppository 650 mg  650 mg Rectal Q6H PRN Arta Silence, MD      . aspirin EC tablet 81 mg  81 mg Oral Daily Arta Silence, MD   81 mg at 09/10/17 0907  . atorvastatin (LIPITOR) tablet 40 mg  40 mg Oral q1800 Arta Silence, MD   40 mg at 09/10/17 1718  . bisacodyl (DULCOLAX) EC tablet 5 mg  5 mg Oral Daily PRN Arta Silence, MD      . clopidogrel (PLAVIX) tablet 75 mg  75 mg Oral Daily Alexis Goodell, MD   75 mg at 09/10/17 0907  . enoxaparin (LOVENOX) injection 40 mg  40 mg Subcutaneous Q24H Arta Silence, MD    40 mg at 09/10/17 0807  . lisinopril (PRINIVIL,ZESTRIL) tablet 5 mg  5 mg Oral Daily Arta Silence, MD   5 mg at 09/10/17 0907  . ondansetron (ZOFRAN) tablet 4 mg  4 mg Oral Q6H PRN Arta Silence, MD       Or  . ondansetron (ZOFRAN) injection 4 mg  4 mg Intravenous Q6H PRN Arta Silence, MD      . oxyCODONE (Oxy IR/ROXICODONE) immediate release tablet 5 mg  5 mg Oral Q4H PRN Wieting, Richard, MD      . senna-docusate (Senokot-S) tablet 1 tablet  1 tablet Oral QHS PRN Arta Silence, MD   1 tablet at 09/10/17 0907  . sodium chloride flush (NS) 0.9 % injection 3 mL  3 mL Intravenous Q12H Arta Silence, MD   3 mL at 09/09/17 2132  . terazosin (HYTRIN) capsule 10 mg  10 mg Oral QHS Arta Silence, MD   10 mg at 09/09/17 2132   Past Medical History:  Diagnosis Date  . Bowel obstruction ([place])   . BPH (benign prostatic hypertrophy)   . Gastric outlet obstruction   . High cholesterol   . Hyperchloremia   . Hypertension   . Pancreatitis    Past Surgical History:  Procedure Laterality Date  . ESOPHAGOGASTRODUODENOSCOPY N/A 12/03/2014   Procedure: ESOPHAGOGASTRODUODENOSCOPY (EGD);  Surgeon: Hulen Luster, MD;  Location: [facility] ENDOSCOPY;  Service: Endoscopy;  Laterality: N/A;   Social History Social History  Tobacco Use  . Smoking status: Former Research scientist (life sciences)  . Smokeless tobacco: Never Used  Substance Use Topics  . Alcohol use: No  . Drug use: No   Family History Family History  Problem Relation Age of Onset  . Heart disease Mother   . Heart disease Father   . Heart disease Brother   Patient denies any family history of peripheral artery disease, venous disease or renal disease.  No Known Allergies   REVIEW OF SYSTEMS (Negative unless checked)  Constitutional: [] Weight loss  [] Fever  [] Chills Cardiac: [] Chest pain   [] Chest pressure   [] Palpitations   [] Shortness of breath when laying flat   [] Shortness of breath at rest   [] Shortness of breath with  exertion. Vascular:  [] Pain in legs with walking   [] Pain in legs at rest   [] Pain in legs when laying flat   [] Claudication   [] Pain in feet when walking  [] Pain in feet at rest  [] Pain in feet when laying flat   [] History of DVT   [] Phlebitis   [x] Swelling in legs   [] Varicose veins   [] Non-healing ulcers Pulmonary:   [] Uses home oxygen   [] Productive cough   [] Hemoptysis   [] Wheeze  [] COPD   [] Asthma Neurologic:  [x] Dizziness  [] Blackouts   [] Seizures   [] History of stroke   [] History of TIA  [] Aphasia   [] Temporary blindness   [] Dysphagia   [x] Weakness or numbness in arms   [x] Weakness or numbness in legs Musculoskeletal:  [] Arthritis   [] Joint swelling   [] Joint pain   [] Low back pain Hematologic:  [] Easy bruising  [] Easy bleeding   [] Hypercoagulable state   [] Anemic  [] Hepatitis Gastrointestinal:  [] Blood in stool   [] Vomiting blood  [] Gastroesophageal reflux/heartburn   [] Difficulty swallowing. Genitourinary:  [x] Chronic kidney disease   [] Difficult urination  [] Frequent urination  [] Burning with urination   [] Blood in urine Skin:  [] Rashes   [] Ulcers   [] Wounds Psychological:  [] History of anxiety   []  History of major depression.  Physical Examination  Vitals:   09/09/17 1934 09/09/17 2327 09/10/17 0352 09/10/17 1635  BP: (!) 165/86 (!) 153/78 (!) 156/74 (!) 169/89  Pulse: (!) 57 63 62 (!) 59  Resp:  18    Temp: 98.2 F (36.8 C) 98.5 F (36.9 C) 97.7 F (36.5 C) 97.9 F (36.6 C)  TempSrc: Oral Oral Oral Oral  SpO2: 96% 99% 96% 95%  Weight:   242 lb (109.8 kg)   Height:       Body mass index is 33.75 kg/m. Gen:  WD/WN, NAD Head: [place]/AT, No temporalis wasting. Prominent temp pulse not noted. Ear/Nose/Throat: Hearing grossly intact, nares w/o erythema or drainage, oropharynx w/o Erythema/Exudate Eyes: Sclera non-icteric, conjunctiva clear Neck: Trachea midline.  No JVD.  No carotid bruits noted on exam. Pulmonary:  Good air movement, respirations not labored, equal  bilaterally.  Cardiac: RRR, normal S1, S2. Vascular:  Vessel Right Left  Radial Palpable Palpable  Ulnar Palpable Palpable  Brachial Palpable Palpable  Carotid Palpable, without bruit Palpable, without bruit  Aorta Not palpable N/A  Femoral Palpable Palpable  Popliteal Palpable Palpable  PT Palpable Palpable  DP Palpable Palpable   Gastrointestinal: soft, non-tender/non-distended. No guarding/reflex.  Musculoskeletal: M/S 5/5 throughout.  Extremities without ischemic changes.  No deformity or atrophy.  Mild edema noted bilaterally Neurologic: Sensation grossly intact in extremities.  Symmetrical.  Speech is fluent. Motor exam as listed above. Psychiatric: Judgment intact, Mood & affect appropriate for pt's clinical situation. Dermatologic: No  rashes or ulcers noted.  No cellulitis or open wounds. Lymph : No Cervical, Axillary, or Inguinal lymphadenopathy.  CBC Lab Results  Component Value Date   WBC 7.0 09/08/2017   HGB 14.9 09/08/2017   HCT 42.8 09/08/2017   MCV 95.0 09/08/2017   PLT 183 09/08/2017   BMET    Component Value Date/Time   NA 136 09/08/2017 2120   NA 138 07/18/2013 0932   K 4.2 09/08/2017 2120   K 4.0 07/18/2013 0932   CL 99 (L) 09/08/2017 2120   CL 101 07/18/2013 0932   CO2 30 09/08/2017 2120   CO2 31 07/18/2013 0932   GLUCOSE 134 (H) 09/08/2017 2120   GLUCOSE 121 (H) 07/18/2013 0932   BUN 22 (H) 09/08/2017 2120   BUN 23 (H) 07/18/2013 0932   CREATININE 1.00 09/09/2017 1619   CREATININE 1.21 (H) 08/31/2017 0800   CALCIUM 8.5 (L) 09/08/2017 2120   CALCIUM 9.5 07/18/2013 0932   GFRNONAA >60 09/08/2017 2120   GFRNONAA 56 (L) 08/31/2017 0800   GFRAA >60 09/08/2017 2120   GFRAA 65 08/31/2017 0800   Estimated Creatinine Clearance: 74.3 mL/min (by C-G formula based on SCr of 1 mg/dL).  COAG Lab Results  Component Value Date   INR 1.12 12/03/2014   Radiology Dg Chest 2 View  Result Date: 09/10/2017 CLINICAL DATA:  Recent fall with chest pain,  initial encounter EXAM: CHEST - 2 VIEW COMPARISON:  None. FINDINGS: Cardiac shadow is within normal limits. Mild aortic calcifications are seen. The lungs are well aerated bilaterally without focal infiltrate or effusion. No pneumothorax is seen. No acute bony abnormality is noted. IMPRESSION: No active cardiopulmonary disease. Electronically Signed   By: Inez Catalina M.D.   On: 09/10/2017 08:01   Dg Ribs Unilateral Left  Result Date: 09/10/2017 CLINICAL DATA:  Left rib pain after fall last night. EXAM: LEFT RIBS - 2 VIEW COMPARISON:  None. FINDINGS: No fracture or other bone lesions are seen involving the ribs. IMPRESSION: Normal left ribs. Electronically Signed   By: Marijo Conception, M.D.   On: 09/10/2017 12:25   Ct Head Wo Contrast  Result Date: 09/08/2017 CLINICAL DATA:  Altered LOC EXAM: CT HEAD WITHOUT CONTRAST TECHNIQUE: Contiguous axial images were obtained from the base of the skull through the vertex without intravenous contrast. COMPARISON:  None. FINDINGS: Brain: No acute territorial infarction, hemorrhage, or intracranial mass. Old left cerebellar infarct. Mild to moderate atrophy. Mild small vessel ischemic changes of the white matter. Slightly prominent ventricles, felt related to atrophy. Vascular: No hyperdense vessels.  Carotid vascular calcification Skull: Normal. Negative for fracture or focal lesion. Left mastoid sclerosis. Small amount of fluid in the inferior mastoids bilaterally. Sinuses/Orbits: Mild mucosal thickening in the maxillary and ethmoid sinuses. No acute orbital abnormality Other: None IMPRESSION: 1. No CT evidence for acute intracranial abnormality. 2. Atrophy and small vessel ischemic changes of the white matter. Old left cerebellar infarct. Electronically Signed   By: Donavan Foil M.D.   On: 09/08/2017 23:25   Ct Angio Neck W Or Wo Contrast  Addendum Date: 09/09/2017   ADDENDUM REPORT: 09/09/2017 17:40 ADDENDUM: These results were called by telephone at the time of  interpretation on 09/09/2017 at 5:40 pm to Dr. Estanislado Pandy , who verbally acknowledged these results. Electronically Signed   By: Ulyses Jarred M.D.   On: 09/09/2017 17:40   Result Date: 09/09/2017 CLINICAL DATA:  Recent stroke. EXAM: CT ANGIOGRAPHY NECK TECHNIQUE: Multidetector CT imaging of the neck was performed using  the standard protocol during bolus administration of intravenous contrast. Multiplanar CT image reconstructions and MIPs were obtained to evaluate the vascular anatomy. Carotid stenosis measurements (when applicable) are obtained utilizing NASCET criteria, using the distal internal carotid diameter as the denominator. CONTRAST:  61mL ISOVUE-370 IOPAMIDOL (ISOVUE-370) INJECTION 76% COMPARISON:  MRA brain 09/09/2017 FINDINGS: Aortic arch: There is mild calcific atherosclerosis of the aortic arch. There is no aneurysm, dissection or hemodynamically significant stenosis of the visualized ascending aorta and aortic arch. Conventional 3 vessel aortic branching pattern. The visualized proximal subclavian arteries are widely patent. Right carotid system: --Common carotid artery: Widely patent origin without common carotid artery dissection or aneurysm. --Internal carotid artery: No dissection, occlusion or aneurysm. No hemodynamically significant stenosis. --External carotid artery: No acute abnormality. Left carotid system: --Common carotid artery: Widely patent origin without common carotid artery dissection or aneurysm. --Internal carotid artery:No dissection, occlusion or aneurysm. No hemodynamically significant stenosis. --External carotid artery: No acute abnormality. Vertebral arteries: Right dominant configuration. Both origins are normal. The left vertebral artery is occluded at the proximal V3 segment. The right vertebral artery is normal. Skeleton: There is no bony spinal canal stenosis. No lytic or blastic lesion. Other neck: Normal pharynx, larynx and major salivary glands. No cervical  lymphadenopathy. Unremarkable thyroid gland. Upper chest: No pneumothorax or pleural effusion. No nodules or masses. Review of the MIP images confirms the above findings IMPRESSION: 1. 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 2. Otherwise normal CTA of the neck. Electronically Signed: By: Ulyses Jarred M.D. On: 09/09/2017 17:08   Mr Angiogram Head Wo Contrast  Result Date: 09/09/2017 CLINICAL DATA:  Presyncope in shower. History of hypertension, hypercholesterolemia. EXAM: MRI HEAD WITHOUT CONTRAST MRA HEAD WITHOUT CONTRAST TECHNIQUE: Multiplanar, multiecho pulse sequences of the brain and surrounding structures were obtained without intravenous contrast. Angiographic images of the head were obtained using MRA technique without contrast. COMPARISON:  CT HEAD Sep 08, 2017 FINDINGS: MRI HEAD FINDINGS INTRACRANIAL CONTENTS: 2 subcentimeter reduced diffusion LEFT cerebellum with low ADC values. Old bilateral cerebellar infarcts. Ventricles and sulci are normal for patient's age. Patchy supratentorial white matter FLAIR T2 hyperintensities compatible with mild chronic small vessel ischemic disease, less than expected for age. Old LEFT thalamus lacunar infarct. Prominent basal ganglia perivascular spaces associated with chronic small vessel ischemic changes. No midline shift, mass effect or masses. No abnormal extra-axial fluid collections. VASCULAR: Normal major intracranial vascular flow voids present at skull base. SKULL AND UPPER CERVICAL SPINE: No abnormal sellar expansion. No suspicious calvarial bone marrow signal. Old appearing base of dens fracture with mild canal stenosis. SINUSES/ORBITS: Small mastoid effusions in trace paranasal sinus mucosal thickening.The included ocular globes and orbital contents are non-suspicious. Status post bilateral ocular lens implants. OTHER: 1 cm cyst RIGHT fossa of Rosenmller. MRA HEAD FINDINGS ANTERIOR CIRCULATION: Normal flow related  enhancement of the included cervical, petrous, cavernous and supraclinoid internal carotid arteries. Patent anterior communicating artery. Patent anterior and middle cerebral arteries, including distal segments. No large vessel occlusion, flow limiting stenosis, aneurysm. POSTERIOR CIRCULATION: LEFT vertebral artery is dominant. Basilar artery is patent, with normal flow related enhancement of the main branch vessels. Patent posterior cerebral arteries. Severe stenosis RIGHT P2 origin associated with tortuous segment resulting in flow artifact. No large vessel occlusion, aneurysm. ANATOMIC VARIANTS: None. Source images and MIP images were reviewed. IMPRESSION: MRI HEAD: 1. Two acute subcentimeter LEFT cerebellar nonhemorrhagic infarcts. 2. Old small cerebellar and LEFT thalamus infarcts. Mild chronic small vessel ischemic  changes. 3. Old appearing base of dens fracture would be better characterized on NONEMERGENT CT cervical spine. MRA HEAD: 1. No emergent large vessel occlusion. 2. Severe stenosis RIGHT P2 segment may be overestimated by flow artifact. Electronically Signed   By: Elon Alas M.D.   On: 09/09/2017 02:58   Mr Brain Wo Contrast  Result Date: 09/09/2017 CLINICAL DATA:  Presyncope in shower. History of hypertension, hypercholesterolemia. EXAM: MRI HEAD WITHOUT CONTRAST MRA HEAD WITHOUT CONTRAST TECHNIQUE: Multiplanar, multiecho pulse sequences of the brain and surrounding structures were obtained without intravenous contrast. Angiographic images of the head were obtained using MRA technique without contrast. COMPARISON:  CT HEAD Sep 08, 2017 FINDINGS: MRI HEAD FINDINGS INTRACRANIAL CONTENTS: 2 subcentimeter reduced diffusion LEFT cerebellum with low ADC values. Old bilateral cerebellar infarcts. Ventricles and sulci are normal for patient's age. Patchy supratentorial white matter FLAIR T2 hyperintensities compatible with mild chronic small vessel ischemic disease, less than expected for age.  Old LEFT thalamus lacunar infarct. Prominent basal ganglia perivascular spaces associated with chronic small vessel ischemic changes. No midline shift, mass effect or masses. No abnormal extra-axial fluid collections. VASCULAR: Normal major intracranial vascular flow voids present at skull base. SKULL AND UPPER CERVICAL SPINE: No abnormal sellar expansion. No suspicious calvarial bone marrow signal. Old appearing base of dens fracture with mild canal stenosis. SINUSES/ORBITS: Small mastoid effusions in trace paranasal sinus mucosal thickening.The included ocular globes and orbital contents are non-suspicious. Status post bilateral ocular lens implants. OTHER: 1 cm cyst RIGHT fossa of Rosenmller. MRA HEAD FINDINGS ANTERIOR CIRCULATION: Normal flow related enhancement of the included cervical, petrous, cavernous and supraclinoid internal carotid arteries. Patent anterior communicating artery. Patent anterior and middle cerebral arteries, including distal segments. No large vessel occlusion, flow limiting stenosis, aneurysm. POSTERIOR CIRCULATION: LEFT vertebral artery is dominant. Basilar artery is patent, with normal flow related enhancement of the main branch vessels. Patent posterior cerebral arteries. Severe stenosis RIGHT P2 origin associated with tortuous segment resulting in flow artifact. No large vessel occlusion, aneurysm. ANATOMIC VARIANTS: None. Source images and MIP images were reviewed. IMPRESSION: MRI HEAD: 1. Two acute subcentimeter LEFT cerebellar nonhemorrhagic infarcts. 2. Old small cerebellar and LEFT thalamus infarcts. Mild chronic small vessel ischemic changes. 3. Old appearing base of dens fracture would be better characterized on NONEMERGENT CT cervical spine. MRA HEAD: 1. No emergent large vessel occlusion. 2. Severe stenosis RIGHT P2 segment may be overestimated by flow artifact. Electronically Signed   By: Elon Alas M.D.   On: 09/09/2017 02:58   Ct Abdomen Pelvis W  Contrast  Result Date: 09/08/2017 CLINICAL DATA:  Acute abdominal pain.  Nausea and vomiting. EXAM: CT ABDOMEN AND PELVIS WITH CONTRAST TECHNIQUE: Multidetector CT imaging of the abdomen and pelvis was performed using the standard protocol following bolus administration of intravenous contrast. CONTRAST:  17mL ISOVUE-370 IOPAMIDOL (ISOVUE-370) INJECTION 76% COMPARISON:  CT 12/02/2014 FINDINGS: Lower chest: Minimal scarring at the right lung base. No pleural fluid. Hepatobiliary: Subcentimeter low-density lesion in the anterior left lobe of the liver is unchanged from prior exam. Diffuse hepatic steatosis with focal fatty sparing adjacent the gallbladder fossa. Gallbladder physiologically distended, no calcified stone. No biliary dilatation. Pancreas: No ductal dilatation or inflammation. Spleen: Normal in size without focal abnormality. Adrenals/Urinary Tract: Normal adrenal glands. No hydronephrosis. Minimal by oral perinephric edema, nonspecific. Homogeneous renal enhancement and symmetric excretion on delayed phase imaging. Urinary bladder is partially distended. Small bladder diverticulum at the dome. Stomach/Bowel: stomach physiologically distended, no abnormal gastric distension on the current  exam. No small bowel dilatation, inflammation or obstruction. Appendix tentatively identified and normal. Colonic diverticulosis distally without acute diverticulitis. Vascular/Lymphatic: Aortic atherosclerosis. Ectatic distal aorta measuring 2.7 cm. Small central mesenteric nodes without pathologic adenopathy. Reproductive: Heterogeneous prostate gland spanning 5.1 cm transverse. Other: Bilateral fat containing inguinal hernias, slightly larger on the right. Minimal laxity of anterior abdominal wall musculature with small fat containing umbilical hernia. No free air, free fluid, or intra-abdominal fluid collection. Musculoskeletal: There are no acute or suspicious osseous abnormalities. IMPRESSION: 1. No acute  abnormality in the abdomen/pelvis. 2. Colonic diverticulosis without diverticulitis. 3. Ectatic infrarenal abdominal aorta maximal dimension 2.7 cm at risk for aneurysm development. Recommend followup by ultrasound in 5 years. This recommendation follows ACR consensus guidelines: White Paper of the ACR Incidental Findings Committee II on Vascular Findings. J Am Coll Radiol 2013; 10:789-794. Aortic Atherosclerosis (ICD10-I70.0). 4. Bilateral fat containing inguinal hernias. Mild hepatic steatosis. Electronically Signed   By: Jeb Levering M.D.   On: 09/08/2017 23:39   Ct Shoulder Left Wo Contrast  Result Date: 09/10/2017 CLINICAL DATA:  Left shoulder pain after a fall. EXAM: CT OF THE UPPER LEFT EXTREMITY WITHOUT CONTRAST TECHNIQUE: Multidetector CT imaging of the upper left extremity was performed according to the standard protocol. COMPARISON:  Radiographs dated 09/10/2017 FINDINGS: Bones/Joint/Cartilage There is a comminuted fracture of the lesser tuberosity of the proximal humerus. The fracture fragments include the attachment of the subscapularis tendon. There is hemorrhage and edema in and around the subscapularis muscle. There is no dislocation. The remainder of the proximal humerus is intact. Scapula and distal clavicle are intact. Minimal AC joint arthropathy. Muscles and Tendons Hemorrhage in and around the subscapularis muscle and tendon. The fracture involves the insertion of the subscapularis on the lesser tuberosity. The other muscles of the rotator cuff appear intact. IMPRESSION: Comminuted slightly displaced fracture lesser tuberosity of the proximal humerus as described. This involves the insertion of the subscapularis tendon. Electronically Signed   By: Lorriane Shire M.D.   On: 09/10/2017 16:13   US Carotid Bilateral  Result Date: 09/09/2017 CLINICAL DATA:  83 year old male with acute left cerebellar infarcts EXAM: BILATERAL CAROTID DUPLEX ULTRASOUND TECHNIQUE: Pearline Cables scale imaging, color  Doppler and duplex ultrasound were performed of bilateral carotid and vertebral arteries in the neck. COMPARISON:  Brain MRI 09/09/2017 FINDINGS: Criteria: Quantification of carotid stenosis is based on velocity parameters that correlate the residual internal carotid diameter with NASCET-based stenosis levels, using the diameter of the distal internal carotid lumen as the denominator for stenosis measurement. The following velocity measurements were obtained: RIGHT ICA: 77/16 cm/sec CCA: 95/18 cm/sec SYSTOLIC ICA/CCA RATIO:  0.8 ECA:  93 cm/sec LEFT ICA: 112/20 cm/sec CCA: 841/66 cm/sec SYSTOLIC ICA/CCA RATIO:  0.9 ECA:  137 cm/sec RIGHT CAROTID ARTERY: No significant atherosclerotic plaque or evidence of stenosis. RIGHT VERTEBRAL ARTERY:  Patent with normal antegrade flow. LEFT CAROTID ARTERY: No significant atherosclerotic plaque or evidence of stenosis. LEFT VERTEBRAL ARTERY:  Patent with normal antegrade flow. IMPRESSION: Negative examination. No significant atherosclerotic plaque or evidence of stenosis. Signed, Criselda Peaches, MD [facility] Electronically Signed   By: Jacqulynn Cadet M.D.   On: 09/09/2017 14:23   Dg Shoulder Left  Result Date: 09/10/2017 CLINICAL DATA:  Recent fall with left shoulder pain, initial encounter EXAM: LEFT SHOULDER - 2+ VIEW COMPARISON:  None. FINDINGS: Humeral head is well seated. Multiple small bony fragments are identified adjacent to the medial aspect of the humeral head although a  complete fracture is not identified. This may be related to some focal impaction. The need for further evaluation can be determined on a clinical basis. IMPRESSION: Multiple small fracture fragments adjacent to the humeral head likely related to some impaction injury. No complete transverse fracture is identified. Cross-sectional imaging may be helpful as indicated. Electronically Signed   By: Inez Catalina M.D.   On: 09/10/2017  08:06   Assessment/Plan The patient is an 83 year old male with multiple medical problems admitted through the [facility] ED for progressively worsening gait disturbance/weakness found to have what appears to be an acute occlusion of the left vertebral artery at the proximal V3 segment - stable 1. Occlusion of the left vertebral artery: Vertebral arteries: Right dominant configuration.  No hemodynamically significant atherosclerotic plaque noted in the bilateral carotid arteries.  At this time, there is no indication for vascular intervention as the occlusion of the patient's left vertebral artery is most likely not the cause of his progressive weakness and gait disturbance. 2.  Hyperlipidemia: Encouraged good control as its slows the progression of atherosclerotic disease 3.  Hypertension: Encouraged good control as its slows the progression of atherosclerotic disease  Discussed with Dr. Francene Castle, PA-C  09/10/2017 7:19 PM  This note was created with Dragon medical transcription system.  Any error is purely unintentional.

## 2017-09-10 NOTE — Clinical Social Work Note (Signed)
Clinical Social Work Assessment  Patient Details  Name: [name] MRN: [id number] Date of Birth: 1935/02/11  Date of referral:  09/10/17               Reason for consult:  Facility Placement                Permission sought to share information with:  Family Supports, Customer service manager Permission granted to share information::  Yes, Verbal Permission Granted  Name::     [place], [place] [phone] or [place] Mart Sister [phone]   Agency::  SNF admissions  Relationship::     Contact Information:     Housing/Transportation Living arrangements for the past 2 months:  Single Family Home Source of Information:  Patient, Spouse Patient Interpreter Needed:  None Criminal Activity/Legal Involvement Pertinent to Current Situation/Hospitalization:  No - Comment as needed Significant Relationships:  Adult Children, Spouse Lives with:  Spouse Do you feel safe going back to the place where you live?  No Need for family participation in patient care:  No (Coment)  Care giving concerns:  Patient and his wife feels he needs some short term rehab before he is able to return back home.   Social Worker assessment / plan:  Patient is an 83 year old male who is alert and oriented x4, married lives with his wife.  Patient states he has not been to rehab before, CSW explained to patient the role of CSW and process for finding placement for SNF.  CSW spoke to patient about how insurance will pay for stay, and what to expect at rehab.  CSW explained the benefits of going to SNF verse going home with home health.  Patient expressed that they live in [place] and would prefer [facility] [place] or [place], because their daughter works there.  Patient was provided list of SNFs in the area, and also gave CSW permission to begin bed search in [facility].  Patient did not express any other questions or concerns.  Employment status:  Retired Forensic scientist:   Medicare PT Recommendations:  [place] / Referral to community resources:  [place]  Patient/Family's Response to care:  Patient and wife agreeable to going to SNF for short term rehab.  Patient/Family's Understanding of and Emotional Response to Diagnosis, Current Treatment, and Prognosis:  Patient and his wife are hopeful that he will not have to stay at [facility] for very long.  Emotional Assessment Appearance:  Appears stated age Attitude/Demeanor/Rapport:    Affect (typically observed):  Appropriate, Calm Orientation:  Oriented to Self, Oriented to Place, Oriented to  Time, Oriented to Situation Alcohol / Substance use:  Not Applicable Psych involvement (Current and /or in the community):  No (Comment)  Discharge Needs  Concerns to be addressed:    Readmission within the last 30 days:  No Current discharge risk:  Lack of support system Barriers to Discharge:  Continued Medical Work up   Anell Barr 09/10/2017, 5:29 PM

## 2017-09-10 NOTE — Progress Notes (Signed)
Pt complaining of L rib and L shoulder pain. MD [place] notified and orders placed for imaging of chest and L shoulder. Nursing staff will continue to monitor for any changes in patient status. Earleen Reaper, RN

## 2017-09-10 NOTE — Consult Note (Signed)
Still complaining of dysmetria on L side and imbalance. S/p fall.    Past Medical History:  Diagnosis Date  . Bowel obstruction ([place])   . BPH (benign prostatic hypertrophy)   . Gastric outlet obstruction   . High cholesterol   . Hyperchloremia   . Hypertension   . Pancreatitis     Past Surgical History:  Procedure Laterality Date  . ESOPHAGOGASTRODUODENOSCOPY N/A 12/03/2014   Procedure: ESOPHAGOGASTRODUODENOSCOPY (EGD);  Surgeon: Hulen Luster, MD;  Location: [facility] ENDOSCOPY;  Service: Endoscopy;  Laterality: N/A;    Family History  Problem Relation Age of Onset  . Heart disease Mother   . Heart disease Father   . Heart disease Brother    Social History:  reports that he has quit smoking. He has never used smokeless tobacco. He reports that he does not drink alcohol or use drugs.  Allergies: No Known Allergies  Medications:  I have reviewed the patient's current medications. Prior to Admission:  Medications Prior to Admission  Medication Sig Dispense Refill Last Dose  . Apoaequorin (PREVAGEN EXTRA STRENGTH) 20 MG CAPS Take 20 mg by mouth daily. 90 capsule 3 09/08/2017 at Unknown time  . aspirin EC 81 MG tablet Take 81 mg by mouth daily.   09/08/2017 at Unknown time  . fluticasone (FLONASE) 50 MCG/ACT nasal spray Place 2 sprays into both nostrils daily. 16 g 0 09/08/2017 at Unknown time  . simvastatin (ZOCOR) 20 MG tablet Take 1 tablet (20 mg total) by mouth at bedtime. 90 tablet 3 09/07/2017 at Unknown time  . terazosin (HYTRIN) 10 MG capsule TAKE 1 CAPSULE AT BEDTIME 90 capsule 3 09/07/2017 at Unknown time   Scheduled: . aspirin EC  81 mg Oral Daily  . atorvastatin  40 mg Oral q1800  . clopidogrel  75 mg Oral Daily  . enoxaparin (LOVENOX) injection  40 mg Subcutaneous Q24H  . lisinopril  5 mg Oral Daily  . sodium chloride flush  3 mL Intravenous Q12H  . terazosin  10 mg Oral QHS    ROS: History obtained from the patient  General ROS: negative for - chills, fatigue, fever,  night sweats, weight gain or weight loss Psychological ROS: negative for - behavioral disorder, hallucinations, memory difficulties, mood swings or suicidal ideation Ophthalmic ROS: negative for - blurry vision, double vision, eye pain or loss of vision ENT ROS: negative for - epistaxis, nasal discharge, oral lesions, sore throat, tinnitus or vertigo Allergy and Immunology ROS: negative for - hives or itchy/watery eyes Hematological and Lymphatic ROS: negative for - bleeding problems, bruising or swollen lymph nodes Endocrine ROS: negative for - galactorrhea, hair pattern changes, polydipsia/polyuria or temperature intolerance Respiratory ROS: negative for - cough, hemoptysis, shortness of breath or wheezing Cardiovascular ROS: negative for - chest pain, dyspnea on exertion, edema or irregular heartbeat Gastrointestinal ROS: as noted in HPI Genito-Urinary ROS: negative for - dysuria, hematuria, incontinence or urinary frequency/urgency Musculoskeletal ROS: negative for - joint swelling or muscular weakness Neurological ROS: as noted in HPI Dermatological ROS: negative for rash and skin lesion changes  Physical Examination: Blood pressure (!) 156/74, pulse 62, temperature 97.7 F (36.5 C), temperature source Oral, resp. rate 18, height 5\' 11"  (1.803 m), weight 242 lb (109.8 kg), SpO2 96 %.  HEENT-  Normocephalic, no lesions, without obvious abnormality.  Normal external eye and conjunctiva.  Normal TM's bilaterally.  Normal auditory canals and external ears. Normal external nose, mucus membranes and septum.  Normal pharynx. Cardiovascular- S1, S2 normal, pulses palpable  throughout   Lungs- chest clear, no wheezing, rales, normal symmetric air entry Abdomen- soft, non-tender; bowel sounds normal; no masses,  no organomegaly Extremities- no edema Lymph-no adenopathy palpable Musculoskeletal-no joint tenderness, deformity or swelling Skin-warm and dry, no hyperpigmentation, vitiligo, or  suspicious lesions  Neurological Examination   Mental Status: Alert, oriented, thought content appropriate.  Speech fluent without evidence of aphasia.  Able to follow 3 step commands without difficulty. Cranial Nerves: II: Discs flat bilaterally; Visual fields grossly normal, pupils equal, round, reactive to light and accommodation III,IV, VI: ptosis not present, extra-ocular motions intact bilaterally V,VII: smile symmetric, facial light touch sensation normal bilaterally VIII: hearing normal bilaterally IX,X: gag reflex present XI: bilateral shoulder shrug XII: midline tongue extension Motor: Right : Upper extremity   5/5    Left:     Upper extremity   5/5  Lower extremity   5/5     Lower extremity   5/5 Tone and bulk:normal tone throughout; no atrophy noted Sensory: Pinprick and light touch intact throughout, bilaterally Deep Tendon Reflexes: 2+ in the upper extremities and absent in the lower extremities Plantars: Right: upgoing   Left: downgoing Cerebellar: Dysmetria with finger-to-nose testing on the left upper extremity and heel to shin on the right lower extremity Gait: not tested due to safety concerns    Laboratory Studies:  Basic Metabolic Panel: Recent Labs  Lab 09/08/17 2120 09/09/17 1619  NA 136  --   K 4.2  --   CL 99*  --   CO2 30  --   GLUCOSE 134*  --   BUN 22*  --   CREATININE 1.01 1.00  CALCIUM 8.5*  --     Liver Function Tests: Recent Labs  Lab 09/08/17 2120  AST 25  ALT 20  ALKPHOS 65  BILITOT 0.8  PROT 7.8  ALBUMIN 4.0   No results for input(s): LIPASE, AMYLASE in the last 168 hours. No results for input(s): AMMONIA in the last 168 hours.  CBC: Recent Labs  Lab 09/08/17 2120  WBC 7.0  HGB 14.9  HCT 42.8  MCV 95.0  PLT 183    Cardiac Enzymes: Recent Labs  Lab 09/08/17 2120 09/09/17 0429 09/09/17 0701  TROPONINI <0.03 <0.03 <0.03    BNP: Invalid input(s): POCBNP  CBG: Recent Labs  Lab 09/09/17 1201 09/09/17 1653  09/09/17 2106 09/10/17 0806 09/10/17 1202  GLUCAP 138* 113* 120* 160* 123*    Microbiology: Results for orders placed or performed in visit on 10/06/16  Microscopic Examination     Status: Abnormal   Collection Time: 10/06/16  3:34 PM  Result Value Ref Range Status   WBC, UA 0-5 0 - 5 /hpf Final   RBC, UA None seen 0 - 2 /hpf Final   Epithelial Cells (non renal) 0-10 0 - 10 /hpf Final   Bacteria, UA Few (A) None seen/Few Final    Coagulation Studies: No results for input(s): LABPROT, INR in the last 72 hours.  Urinalysis:  Recent Labs  Lab 09/08/17 2227  COLORURINE YELLOW*  LABSPEC 1.013  PHURINE 8.0  GLUCOSEU 150*  HGBUR NEGATIVE  BILIRUBINUR NEGATIVE  KETONESUR 5*  PROTEINUR NEGATIVE  NITRITE NEGATIVE  LEUKOCYTESUR NEGATIVE    Lipid Panel:    Component Value Date/Time   CHOL 218 (H) 08/31/2017 0800   TRIG 163 (H) 08/31/2017 0800   HDL 44 08/31/2017 0800   CHOLHDL 5.0 (H) 08/31/2017 0800   VLDL 21 07/20/2016 0001   LDLCALC 144 (H) 08/31/2017 0800  HgbA1C:  Lab Results  Component Value Date   HGBA1C 5.4 09/09/2017    Urine Drug Screen:  No results found for: LABOPIA, COCAINSCRNUR, LABBENZ, AMPHETMU, THCU, LABBARB  Alcohol Level: No results for input(s): ETH in the last 168 hours.  Other results: EKG: sinus  Imaging: Dg Chest 2 View  Result Date: 09/10/2017 CLINICAL DATA:  Recent fall with chest pain, initial encounter EXAM: CHEST - 2 VIEW COMPARISON:  None. FINDINGS: Cardiac shadow is within normal limits. Mild aortic calcifications are seen. The lungs are well aerated bilaterally without focal infiltrate or effusion. No pneumothorax is seen. No acute bony abnormality is noted. IMPRESSION: No active cardiopulmonary disease. Electronically Signed   By: Inez Catalina M.D.   On: 09/10/2017 08:01   Dg Ribs Unilateral Left  Result Date: 09/10/2017 CLINICAL DATA:  Left rib pain after fall last night. EXAM: LEFT RIBS - 2 VIEW COMPARISON:  None. FINDINGS:  No fracture or other bone lesions are seen involving the ribs. IMPRESSION: Normal left ribs. Electronically Signed   By: Marijo Conception, M.D.   On: 09/10/2017 12:25   Ct Head Wo Contrast  Result Date: 09/08/2017 CLINICAL DATA:  Altered LOC EXAM: CT HEAD WITHOUT CONTRAST TECHNIQUE: Contiguous axial images were obtained from the base of the skull through the vertex without intravenous contrast. COMPARISON:  None. FINDINGS: Brain: No acute territorial infarction, hemorrhage, or intracranial mass. Old left cerebellar infarct. Mild to moderate atrophy. Mild small vessel ischemic changes of the white matter. Slightly prominent ventricles, felt related to atrophy. Vascular: No hyperdense vessels.  Carotid vascular calcification Skull: Normal. Negative for fracture or focal lesion. Left mastoid sclerosis. Small amount of fluid in the inferior mastoids bilaterally. Sinuses/Orbits: Mild mucosal thickening in the maxillary and ethmoid sinuses. No acute orbital abnormality Other: None IMPRESSION: 1. No CT evidence for acute intracranial abnormality. 2. Atrophy and small vessel ischemic changes of the white matter. Old left cerebellar infarct. Electronically Signed   By: Donavan Foil M.D.   On: 09/08/2017 23:25   Ct Angio Neck W Or Wo Contrast  Addendum Date: 09/09/2017   ADDENDUM REPORT: 09/09/2017 17:40 ADDENDUM: These results were called by telephone at the time of interpretation on 09/09/2017 at 5:40 pm to Dr. Estanislado Pandy , who verbally acknowledged these results. Electronically Signed   By: Ulyses Jarred M.D.   On: 09/09/2017 17:40   Result Date: 09/09/2017 CLINICAL DATA:  Recent stroke. EXAM: CT ANGIOGRAPHY NECK TECHNIQUE: Multidetector CT imaging of the neck was performed using the standard protocol during bolus administration of intravenous contrast. Multiplanar CT image reconstructions and MIPs were obtained to evaluate the vascular anatomy. Carotid stenosis measurements (when applicable) are obtained utilizing  NASCET criteria, using the distal internal carotid diameter as the denominator. CONTRAST:  71mL ISOVUE-370 IOPAMIDOL (ISOVUE-370) INJECTION 76% COMPARISON:  MRA brain 09/09/2017 FINDINGS: Aortic arch: There is mild calcific atherosclerosis of the aortic arch. There is no aneurysm, dissection or hemodynamically significant stenosis of the visualized ascending aorta and aortic arch. Conventional 3 vessel aortic branching pattern. The visualized proximal subclavian arteries are widely patent. Right carotid system: --Common carotid artery: Widely patent origin without common carotid artery dissection or aneurysm. --Internal carotid artery: No dissection, occlusion or aneurysm. No hemodynamically significant stenosis. --External carotid artery: No acute abnormality. Left carotid system: --Common carotid artery: Widely patent origin without common carotid artery dissection or aneurysm. --Internal carotid artery:No dissection, occlusion or aneurysm. No hemodynamically significant stenosis. --External carotid artery: No acute abnormality. Vertebral arteries: Right dominant configuration. Both  origins are normal. The left vertebral artery is occluded at the proximal V3 segment. The right vertebral artery is normal. Skeleton: There is no bony spinal canal stenosis. No lytic or blastic lesion. Other neck: Normal pharynx, larynx and major salivary glands. No cervical lymphadenopathy. Unremarkable thyroid gland. Upper chest: No pneumothorax or pleural effusion. No nodules or masses. Review of the MIP images confirms the above findings IMPRESSION: 1. 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 2. Otherwise normal CTA of the neck. Electronically Signed: By: Ulyses Jarred M.D. On: 09/09/2017 17:08   Mr Angiogram Head Wo Contrast  Result Date: 09/09/2017 CLINICAL DATA:  Presyncope in shower. History of hypertension, hypercholesterolemia. EXAM: MRI HEAD WITHOUT CONTRAST MRA HEAD WITHOUT  CONTRAST TECHNIQUE: Multiplanar, multiecho pulse sequences of the brain and surrounding structures were obtained without intravenous contrast. Angiographic images of the head were obtained using MRA technique without contrast. COMPARISON:  CT HEAD Sep 08, 2017 FINDINGS: MRI HEAD FINDINGS INTRACRANIAL CONTENTS: 2 subcentimeter reduced diffusion LEFT cerebellum with low ADC values. Old bilateral cerebellar infarcts. Ventricles and sulci are normal for patient's age. Patchy supratentorial white matter FLAIR T2 hyperintensities compatible with mild chronic small vessel ischemic disease, less than expected for age. Old LEFT thalamus lacunar infarct. Prominent basal ganglia perivascular spaces associated with chronic small vessel ischemic changes. No midline shift, mass effect or masses. No abnormal extra-axial fluid collections. VASCULAR: Normal major intracranial vascular flow voids present at skull base. SKULL AND UPPER CERVICAL SPINE: No abnormal sellar expansion. No suspicious calvarial bone marrow signal. Old appearing base of dens fracture with mild canal stenosis. SINUSES/ORBITS: Small mastoid effusions in trace paranasal sinus mucosal thickening.The included ocular globes and orbital contents are non-suspicious. Status post bilateral ocular lens implants. OTHER: 1 cm cyst RIGHT fossa of Rosenmller. MRA HEAD FINDINGS ANTERIOR CIRCULATION: Normal flow related enhancement of the included cervical, petrous, cavernous and supraclinoid internal carotid arteries. Patent anterior communicating artery. Patent anterior and middle cerebral arteries, including distal segments. No large vessel occlusion, flow limiting stenosis, aneurysm. POSTERIOR CIRCULATION: LEFT vertebral artery is dominant. Basilar artery is patent, with normal flow related enhancement of the main branch vessels. Patent posterior cerebral arteries. Severe stenosis RIGHT P2 origin associated with tortuous segment resulting in flow artifact. No large vessel  occlusion, aneurysm. ANATOMIC VARIANTS: None. Source images and MIP images were reviewed. IMPRESSION: MRI HEAD: 1. Two acute subcentimeter LEFT cerebellar nonhemorrhagic infarcts. 2. Old small cerebellar and LEFT thalamus infarcts. Mild chronic small vessel ischemic changes. 3. Old appearing base of dens fracture would be better characterized on NONEMERGENT CT cervical spine. MRA HEAD: 1. No emergent large vessel occlusion. 2. Severe stenosis RIGHT P2 segment may be overestimated by flow artifact. Electronically Signed   By: Elon Alas M.D.   On: 09/09/2017 02:58   Mr Brain Wo Contrast  Result Date: 09/09/2017 CLINICAL DATA:  Presyncope in shower. History of hypertension, hypercholesterolemia. EXAM: MRI HEAD WITHOUT CONTRAST MRA HEAD WITHOUT CONTRAST TECHNIQUE: Multiplanar, multiecho pulse sequences of the brain and surrounding structures were obtained without intravenous contrast. Angiographic images of the head were obtained using MRA technique without contrast. COMPARISON:  CT HEAD Sep 08, 2017 FINDINGS: MRI HEAD FINDINGS INTRACRANIAL CONTENTS: 2 subcentimeter reduced diffusion LEFT cerebellum with low ADC values. Old bilateral cerebellar infarcts. Ventricles and sulci are normal for patient's age. Patchy supratentorial white matter FLAIR T2 hyperintensities compatible with mild chronic small vessel ischemic disease, less than expected for age. Old LEFT thalamus lacunar infarct. Prominent basal  ganglia perivascular spaces associated with chronic small vessel ischemic changes. No midline shift, mass effect or masses. No abnormal extra-axial fluid collections. VASCULAR: Normal major intracranial vascular flow voids present at skull base. SKULL AND UPPER CERVICAL SPINE: No abnormal sellar expansion. No suspicious calvarial bone marrow signal. Old appearing base of dens fracture with mild canal stenosis. SINUSES/ORBITS: Small mastoid effusions in trace paranasal sinus mucosal thickening.The included ocular  globes and orbital contents are non-suspicious. Status post bilateral ocular lens implants. OTHER: 1 cm cyst RIGHT fossa of Rosenmller. MRA HEAD FINDINGS ANTERIOR CIRCULATION: Normal flow related enhancement of the included cervical, petrous, cavernous and supraclinoid internal carotid arteries. Patent anterior communicating artery. Patent anterior and middle cerebral arteries, including distal segments. No large vessel occlusion, flow limiting stenosis, aneurysm. POSTERIOR CIRCULATION: LEFT vertebral artery is dominant. Basilar artery is patent, with normal flow related enhancement of the main branch vessels. Patent posterior cerebral arteries. Severe stenosis RIGHT P2 origin associated with tortuous segment resulting in flow artifact. No large vessel occlusion, aneurysm. ANATOMIC VARIANTS: None. Source images and MIP images were reviewed. IMPRESSION: MRI HEAD: 1. Two acute subcentimeter LEFT cerebellar nonhemorrhagic infarcts. 2. Old small cerebellar and LEFT thalamus infarcts. Mild chronic small vessel ischemic changes. 3. Old appearing base of dens fracture would be better characterized on NONEMERGENT CT cervical spine. MRA HEAD: 1. No emergent large vessel occlusion. 2. Severe stenosis RIGHT P2 segment may be overestimated by flow artifact. Electronically Signed   By: Elon Alas M.D.   On: 09/09/2017 02:58   Ct Abdomen Pelvis W Contrast  Result Date: 09/08/2017 CLINICAL DATA:  Acute abdominal pain.  Nausea and vomiting. EXAM: CT ABDOMEN AND PELVIS WITH CONTRAST TECHNIQUE: Multidetector CT imaging of the abdomen and pelvis was performed using the standard protocol following bolus administration of intravenous contrast. CONTRAST:  40mL ISOVUE-370 IOPAMIDOL (ISOVUE-370) INJECTION 76% COMPARISON:  CT 12/02/2014 FINDINGS: Lower chest: Minimal scarring at the right lung base. No pleural fluid. Hepatobiliary: Subcentimeter low-density lesion in the anterior left lobe of the liver is unchanged from prior  exam. Diffuse hepatic steatosis with focal fatty sparing adjacent the gallbladder fossa. Gallbladder physiologically distended, no calcified stone. No biliary dilatation. Pancreas: No ductal dilatation or inflammation. Spleen: Normal in size without focal abnormality. Adrenals/Urinary Tract: Normal adrenal glands. No hydronephrosis. Minimal by oral perinephric edema, nonspecific. Homogeneous renal enhancement and symmetric excretion on delayed phase imaging. Urinary bladder is partially distended. Small bladder diverticulum at the dome. Stomach/Bowel: stomach physiologically distended, no abnormal gastric distension on the current exam. No small bowel dilatation, inflammation or obstruction. Appendix tentatively identified and normal. Colonic diverticulosis distally without acute diverticulitis. Vascular/Lymphatic: Aortic atherosclerosis. Ectatic distal aorta measuring 2.7 cm. Small central mesenteric nodes without pathologic adenopathy. Reproductive: Heterogeneous prostate gland spanning 5.1 cm transverse. Other: Bilateral fat containing inguinal hernias, slightly larger on the right. Minimal laxity of anterior abdominal wall musculature with small fat containing umbilical hernia. No free air, free fluid, or intra-abdominal fluid collection. Musculoskeletal: There are no acute or suspicious osseous abnormalities. IMPRESSION: 1. No acute abnormality in the abdomen/pelvis. 2. Colonic diverticulosis without diverticulitis. 3. Ectatic infrarenal abdominal aorta maximal dimension 2.7 cm at risk for aneurysm development. Recommend followup by ultrasound in 5 years. This recommendation follows ACR consensus guidelines: White Paper of the ACR Incidental Findings Committee II on Vascular Findings. J Am Coll Radiol 2013; 10:789-794. Aortic Atherosclerosis (ICD10-I70.0). 4. Bilateral fat containing inguinal hernias. Mild hepatic steatosis. Electronically Signed   By: Jeb Levering M.D.   On: 09/08/2017 23:39  US Carotid  Bilateral  Result Date: 09/09/2017 CLINICAL DATA:  83 year old male with acute left cerebellar infarcts EXAM: BILATERAL CAROTID DUPLEX ULTRASOUND TECHNIQUE: Pearline Cables scale imaging, color Doppler and duplex ultrasound were performed of bilateral carotid and vertebral arteries in the neck. COMPARISON:  Brain MRI 09/09/2017 FINDINGS: Criteria: Quantification of carotid stenosis is based on velocity parameters that correlate the residual internal carotid diameter with NASCET-based stenosis levels, using the diameter of the distal internal carotid lumen as the denominator for stenosis measurement. The following velocity measurements were obtained: RIGHT ICA: 77/16 cm/sec CCA: 06/26 cm/sec SYSTOLIC ICA/CCA RATIO:  0.8 ECA:  93 cm/sec LEFT ICA: 112/20 cm/sec CCA: 948/54 cm/sec SYSTOLIC ICA/CCA RATIO:  0.9 ECA:  137 cm/sec RIGHT CAROTID ARTERY: No significant atherosclerotic plaque or evidence of stenosis. RIGHT VERTEBRAL ARTERY:  Patent with normal antegrade flow. LEFT CAROTID ARTERY: No significant atherosclerotic plaque or evidence of stenosis. LEFT VERTEBRAL ARTERY:  Patent with normal antegrade flow. IMPRESSION: Negative examination. No significant atherosclerotic plaque or evidence of stenosis. Signed, Criselda Peaches, MD [facility] Electronically Signed   By: Jacqulynn Cadet M.D.   On: 09/09/2017 14:23   Dg Shoulder Left  Result Date: 09/10/2017 CLINICAL DATA:  Recent fall with left shoulder pain, initial encounter EXAM: LEFT SHOULDER - 2+ VIEW COMPARISON:  None. FINDINGS: Humeral head is well seated. Multiple small bony fragments are identified adjacent to the medial aspect of the humeral head although a complete fracture is not identified. This may be related to some focal impaction. The need for further evaluation can be determined on a clinical basis. IMPRESSION: Multiple small fracture fragments adjacent to the humeral head likely related to  some impaction injury. No complete transverse fracture is identified. Cross-sectional imaging may be helpful as indicated. Electronically Signed   By: Inez Catalina M.D.   On: 09/10/2017 08:06    Assessment: 83 y.o. male who presents with gait imbalance.  MRI of the brain reviewed and shows two small left cerebellar infarcts and chronic left cerebellar and thalamic infarcts.  Likely secondary to small vessel disease.  Severe stenosis of the right P2 is noted.  Carotid dopplers pending.  Echocardiogram shows no cardiac source of emboli with an EF of 60-65%.  A1c 5.4, LDL 144.    -L cerebellar infarct in the setting of L vert occlusion  - s/p fall and L humeral fracture  - Pt is on ASA and plavix  - pt/ot.

## 2017-09-10 NOTE — Evaluation (Signed)
Physical Therapy Evaluation Patient Details Name: [name] MRN: [id number] DOB: 15-Oct-1934 Today's Date: 09/10/2017   History of Present Illness  83 y/o male here after syncopal episode and weakness. Found to have acute on chronic CVA as well as vertbral artery occlusion.  Pt had a fall on L side, c/o considerable pain in L ribs/shoulder.  Clinical Impression  Pt is very sleepy on PT arrival and while laying in bed trying to gather history, etc he fell asleep multiple times.  He showed good effort with PT exam and with ~10 minutes of exercises apart from the PT exam but ultimately was very limited and reported feeling as though the fall took everything out of him and he is feeling "completely not myself."  Pt with lean to the L in sitting and then with very poor ability to maintain balance in standing with stagger/falling to the L t/o the effort to get to recliner.      Follow Up Recommendations SNF    Equipment Recommendations       Recommendations for Other Services       Precautions / Restrictions Precautions Precautions: Fall Restrictions Weight Bearing Restrictions: No      Mobility  Bed Mobility Overal bed mobility: Needs Assistance Bed Mobility: Supine to Sit     Supine to sit: Mod assist     General bed mobility comments: Pt with a lot of pain in L side, needed considerable assist to get to EOB  Transfers Overall transfer level: Needs assistance Equipment used: Rolling walker (2 wheeled) Transfers: Sit to/from Stand Sit to Stand: Max assist         General transfer comment: Pt leaning heavily to the L, unable to use L UE appropriately on walker and needing heavy assist to stay upright secondary to L lean  Ambulation/Gait Ambulation/Gait assistance: Max assist Ambulation Distance (Feet): 3 Feet Assistive device: Rolling walker (2 wheeled)       General Gait Details: Pt struggled  to advance LEs/take steps and was only able to shuffle to recliner with  heavy reliance on walker, and leaning L (into PT)  Stairs            Wheelchair Mobility    Modified Rankin (Stroke Patients Only)       Balance Overall balance assessment: Needs assistance Sitting-balance support: Single extremity supported Sitting balance-Leahy Scale: Fair Sitting balance - Comments: Pt leaning to the L, unable to consistently maintain upright and needing frequent assistance     Standing balance-Leahy Scale: Poor Standing balance comment: Pt leaning heavily to the L, reliant on PT to maintain upright                             Pertinent Vitals/Pain Pain Assessment: 0-10 Pain Score: 7  Pain Location: L side pain, mostly ribs and shoulder    Home Living Family/patient expects to be discharged to:: Skilled nursing facility Living Arrangements: Spouse/significant other                    Prior Function Level of Independence: Independent with assistive device(s)               Hand Dominance        Extremity/Trunk Assessment   Upper Extremity Assessment Upper Extremity Assessment: Generalized weakness(pain limited L shld/UE movement, R grossly 3/5)    Lower Extremity Assessment Lower Extremity Assessment: Generalized weakness(grossly = strength in b/l LEs, ~3+/5 t/o)  Communication   Communication: (pt sleepy, did appear to have some light confusion)  Cognition Arousal/Alertness: Lethargic Behavior During Therapy: (altered from baseline per wife, fell asleep multiple times) Overall Cognitive Status: Impaired/Different from baseline Area of Impairment: Attention;Awareness;Safety/judgement                               General Comments: Pt was able to recongize that he had some limitations but was lethargic and showed decreased awareness      General Comments      Exercises General Exercises - Lower Extremity Heel Slides: Strengthening;5 reps Hip ABduction/ADduction: Strengthening;5  reps Straight Leg Raises: AAROM;5 reps Hip Flexion/Marching: Strengthening;5 reps   Assessment/Plan    PT Assessment Patient needs continued PT services  PT Problem List Decreased strength;Decreased range of motion;Decreased activity tolerance;Decreased balance;Decreased coordination;Decreased mobility;Decreased knowledge of use of DME;Decreased safety awareness;Decreased cognition;Pain       PT Treatment Interventions Gait training;DME instruction;Functional mobility training;Therapeutic activities;Therapeutic exercise;Balance training;Neuromuscular re-education;Patient/family education;Cognitive remediation    PT Goals (Current goals can be found in the Care Plan section)  Acute Rehab PT Goals Patient Stated Goal: get stronger PT Goal Formulation: With patient Time For Goal Achievement: 09/24/17 Potential to Achieve Goals: Fair    Frequency 7X/week   Barriers to discharge        Co-evaluation               AM-PAC PT "6 Clicks" Daily Activity  Outcome Measure Difficulty turning over in bed (including adjusting bedclothes, sheets and blankets)?: Unable Difficulty moving from lying on back to sitting on the side of the bed? : Unable Difficulty sitting down on and standing up from a chair with arms (e.g., wheelchair, bedside commode, etc,.)?: Unable Help needed moving to and from a bed to chair (including a wheelchair)?: Total Help needed walking in hospital room?: Total Help needed climbing 3-5 steps with a railing? : Total 6 Click Score: 6    End of Session Equipment Utilized During Treatment: Gait belt Activity Tolerance: Patient limited by pain;Patient limited by lethargy Patient left: with chair alarm set;with call bell/phone within reach Nurse Communication: Mobility status PT Visit Diagnosis: Muscle weakness (generalized) (M62.81);Difficulty in walking, not elsewhere classified (R26.2)    Time: 3149-7026 PT Time Calculation (min) (ACUTE ONLY): 31  min   Charges:   PT Evaluation $PT Eval Low Complexity: 1 Low PT Treatments $Therapeutic Exercise: 8-22 mins   PT G Codes:        Kreg Shropshire, DPT 09/10/2017, 12:23 PM

## 2017-09-10 NOTE — Evaluation (Signed)
Occupational Therapy Evaluation Patient Details Name: [name] MRN: [id number] DOB: May 08, 1934 Today's Date: 09/10/2017    History of Present Illness Pt. is is an 83 y.o. male who was admitted to [facility] with a syncopal episode, and weakness.  Pt. PMHx includes: Chronic CVA, and Vertebral Artery Occlusion. Pt. had a fall on the left shoulder/ ribs, resulting in pain.   Clinical Impression   Pt. Presents with lethargy, weakness, limited activity tolerance, LUE limitations/pain form a fall, and limited functional mobility which limits the ability to complete basic ADL and IADL functioning. Pt. Resides at home with his wife. Pt. was independent with ADLs, and IADL functioning: including meal preparation, and medication management. Pt. was lethargic during the session, opening eyes, and engaging intermittently during the session. Pt. LUE shoulder imaging is pending secondary to pt. having had a fall, and having left shoulder pain in the shoulder. Pt. Could benefit from OT services for ADL training, A/E training,further assessment of LUE functioning once LUE imaging/work-up has been complete, and pt. Education about home modification, and DME. Pt. would benefitt from SNF level of care upon discharge. Pt. Could benefit from follow-up OT services at discharge.    Follow Up Recommendations  SNF    Equipment Recommendations       Recommendations for Other Services       Precautions / Restrictions Precautions Precautions: Fall Restrictions Weight Bearing Restrictions: No      Mobility Bed Mobility Overal bed mobility: Needs Assistance Bed Mobility: Supine to Sit     Supine to sit: Mod assist        Transfers Overall transfer level: Needs assistance Equipment used: Rolling walker (2 wheeled)   Sit to Stand: Max assist         General transfer comment: Mobility per PT    Balance                                           ADL either performed or assessed  with clinical judgement   ADL Overall ADL's : Needs assistance/impaired Eating/Feeding: Set up;Moderate assistance   Grooming: Set up;Moderate assistance   Upper Body Bathing: Set up;Maximal assistance   Lower Body Bathing: Set up;Maximal assistance   Upper Body Dressing : Set up;Maximal assistance   Lower Body Dressing: Set up;Maximal assistance                       Vision Baseline Vision/History: Wears glasses Wears Glasses: Reading only       Perception     Praxis      Pertinent Vitals/Pain Pain Assessment: No/denies pain     Hand Dominance Left   Extremity/Trunk Assessment Upper Extremity Assessment Upper Extremity Assessment: Generalized weakness(LUE N/A secondary to painful shoulder/ribs following a fall. Imaging workup pending.)           Communication     Cognition Arousal/Alertness: Lethargic   Overall Cognitive Status: Impaired/Different from baseline Area of Impairment: Attention                                   General Comments       Exercises     Shoulder Instructions      Home Living Family/patient expects to be discharged to:: Skilled nursing facility Living Arrangements: Spouse/significant other Available Help at  Discharge: Family Type of Home: House Home Access: Stairs to enter Technical brewer of Steps: 2   Home Layout: One level     Bathroom Shower/Tub: Walk-in shower;Curtain         Home Equipment: None          Prior Functioning/Environment Level of Independence: Independent with assistive device(s)                 OT Problem List: Decreased strength;Decreased range of motion;Pain;Decreased knowledge of use of DME or AE;Decreased activity tolerance;Decreased coordination;Impaired UE functional use      OT Treatment/Interventions: Self-care/ADL training;Neuromuscular education;Patient/family education;Therapeutic activities;DME and/or AE instruction    OT Goals(Current goals  can be found in the care plan section)    OT Frequency: Min 2X/week   Barriers to D/C:            Co-evaluation              AM-PAC PT "6 Clicks" Daily Activity     Outcome Measure Help from another person eating meals?: A Lot Help from another person taking care of personal grooming?: A Lot Help from another person toileting, which includes using toliet, bedpan, or urinal?: A Lot Help from another person bathing (including washing, rinsing, drying)?: A Lot Help from another person to put on and taking off regular upper body clothing?: A Lot Help from another person to put on and taking off regular lower body clothing?: A Lot 6 Click Score: 12   End of Session    Activity Tolerance: Patient tolerated treatment well Patient left: in bed  OT Visit Diagnosis: Muscle weakness (generalized) (M62.81);History of falling (Z91.81)                Time: 1420-1440 OT Time Calculation (min): 20 min Charges:  OT General Charges $OT Visit: 1 Visit OT Evaluation $OT Eval Low Complexity: 1 Low G-Codes:     Harrel Carina, MS, OTR/L   Harrel Carina, MS, OTR/L 09/10/2017, 3:02 PM

## 2017-09-10 NOTE — NC FL2 (Signed)
[place] LEVEL OF CARE SCREENING TOOL     IDENTIFICATION  [name] Birthdate: [birth year] Sex: male Admission Date (Current Location): 09/08/2017  [place] and [place] Number:  Engineering geologist and Address:  [facility], [address], [place], [place] [address]      Provider Number: [id number]  Attending Physician Name and Address:  Loletha Grayer, MD  Relative Name and Phone Number:  Trea, Latner [phone] or Gevena Mart Sister [phone]     Current Level of Care: Hospital Recommended Level of Care: [place] Prior Approval Number:    Date Approved/Denied:   PASRR Number: [id number]  Discharge Plan: Home    Current Diagnoses: Patient Active Problem List   Diagnosis Date Noted  . Near syncope 09/09/2017  . Cerebellar stroke, acute ([place]) 09/09/2017  . CKD (chronic kidney disease), stage III ([place]) 09/01/2017  . Elevated PSA, between 10 and less than 20 ng/ml 07/22/2016  . Bilateral lower extremity edema 07/22/2016  . Abnormal glucose 07/15/2016  . BPH with obstruction/lower urinary tract symptoms 04/10/2016  . Hypertension 04/10/2016  . Memory loss 04/10/2016  . Gastric outlet obstruction 12/02/2014  . Generalized abdominal pain   . Benign fibroma of prostate 01/02/2011  . HLD (hyperlipidemia) 01/02/2011  . Obesity (BMI 30.0-34.9) 01/02/2011    Orientation RESPIRATION BLADDER Height & Weight     Time, Self, Situation, Place  Normal Incontinent Weight: 242 lb (109.8 kg) Height:  5\' 11"  (180.3 cm)  BEHAVIORAL SYMPTOMS/MOOD NEUROLOGICAL BOWEL NUTRITION STATUS      Continent Diet  AMBULATORY STATUS COMMUNICATION OF NEEDS Skin   Limited Assist Verbally Normal                       Personal Care Assistance Level of Assistance  Bathing, Dressing, Feeding Bathing Assistance: Limited assistance Feeding assistance: Independent Dressing Assistance: Limited  assistance     Functional Limitations Info  Speech, Hearing, Sight Sight Info: Adequate Hearing Info: Adequate Speech Info: Adequate    SPECIAL CARE FACTORS FREQUENCY  PT (By licensed PT), OT (By licensed OT)     PT Frequency: 5x a week OT Frequency: 5x a week            Contractures      Additional Factors Info  Code Status, Allergies Code Status Info: Full Code Allergies Info: NKA           Current Medications (09/10/2017):  This is the current hospital active medication list Current Facility-Administered Medications  Medication Dose Route Frequency Provider Last Rate Last Dose  . acetaminophen (TYLENOL) tablet 650 mg  650 mg Oral Q6H PRN Arta Silence, MD       Or  . acetaminophen (TYLENOL) suppository 650 mg  650 mg Rectal Q6H PRN Arta Silence, MD      . aspirin EC tablet 81 mg  81 mg Oral Daily Arta Silence, MD   81 mg at 09/10/17 0907  . atorvastatin (LIPITOR) tablet 40 mg  40 mg Oral q1800 Arta Silence, MD   40 mg at 09/09/17 1808  . bisacodyl (DULCOLAX) EC tablet 5 mg  5 mg Oral Daily PRN Arta Silence, MD      . clopidogrel (PLAVIX) tablet 75 mg  75 mg Oral Daily Alexis Goodell, MD   75 mg at 09/10/17 0907  . enoxaparin (LOVENOX) injection 40 mg  40 mg Subcutaneous Q24H Arta Silence, MD   40 mg at 09/10/17 0807  .  lisinopril (PRINIVIL,ZESTRIL) tablet 5 mg  5 mg Oral Daily Arta Silence, MD   5 mg at 09/10/17 0907  . ondansetron (ZOFRAN) tablet 4 mg  4 mg Oral Q6H PRN Arta Silence, MD       Or  . ondansetron (ZOFRAN) injection 4 mg  4 mg Intravenous Q6H PRN Arta Silence, MD      . oxyCODONE (Oxy IR/ROXICODONE) immediate release tablet 5 mg  5 mg Oral Q4H PRN Wieting, Richard, MD      . senna-docusate (Senokot-S) tablet 1 tablet  1 tablet Oral QHS PRN Arta Silence, MD   1 tablet at 09/10/17 0907  . sodium chloride flush (NS) 0.9 % injection 3 mL  3 mL Intravenous Q12H Arta Silence, MD   3  mL at 09/09/17 2132  . terazosin (HYTRIN) capsule 10 mg  10 mg Oral QHS Arta Silence, MD   10 mg at 09/09/17 2132     Discharge Medications: Please see discharge summary for a list of discharge medications.  Relevant Imaging Results:  Relevant Lab Results:   Additional Information SSN [id number]  Ross Ludwig, [place]

## 2017-09-10 NOTE — Progress Notes (Signed)
Patient ID: [name], male   DOB: Apr 17, 1935, 83 y.o.   MRN: [id number]  [facility] PROGRESS NOTE  [name] VVO:[id number] DOB: 27-Jun-1934 DOA: 09/08/2017 PCP: [name], DO  HPI/Subjective: Patient not feeling well.  Is having pain on his left side of his body.  Last night he needed to urinate so he got up he could not find the urinal so he got up and took 3 steps slipped in his urine and fell on his shoulder and ribs.  He is unable to move his left shoulder very well.  Objective: Vitals:   09/09/17 2327 09/10/17 0352  BP: (!) 153/78 (!) 156/74  Pulse: 63 62  Resp: 18   Temp: 98.5 F (36.9 C) 97.7 F (36.5 C)  SpO2: 99% 96%    Filed Weights   09/08/17 2102 09/09/17 0317 09/10/17 0352  Weight: 105.7 kg (233 lb) 105.9 kg (233 lb 8 oz) 109.8 kg (242 lb)    ROS: Review of Systems  Constitutional: Negative for chills and fever.  Eyes: Negative for blurred vision.  Respiratory: Positive for shortness of breath. Negative for cough.   Cardiovascular: Positive for chest pain.  Gastrointestinal: Negative for abdominal pain, constipation, diarrhea, nausea and vomiting.  Genitourinary: Negative for dysuria.  Musculoskeletal: Positive for joint pain.  Neurological: Negative for dizziness and headaches.   Exam: Physical Exam  Constitutional: He is oriented to person, place, and time.  HENT:  Nose: No mucosal edema.  Mouth/Throat: No oropharyngeal exudate or posterior oropharyngeal edema.  Eyes: Pupils are equal, round, and reactive to light. Conjunctivae, EOM and lids are normal.  Neck: No JVD present. Carotid bruit is not present. No edema present. No thyroid mass and no thyromegaly present.  Cardiovascular: S1 normal and S2 normal. Exam reveals no gallop.  No murmur heard. Pulses:      Dorsalis pedis pulses are 2+ on the right side, and 2+ on the left side.  Respiratory: No respiratory distress. He has no wheezes. He has no rhonchi. He has no rales.   GI: Soft. Bowel sounds are normal. There is no tenderness.  Musculoskeletal:       Right ankle: He exhibits no swelling.       Left ankle: He exhibits no swelling.  Lymphadenopathy:    He has no cervical adenopathy.  Neurological: He is alert and oriented to person, place, and time. No cranial nerve deficit.  Left-sided weakness.  Skin: Skin is warm. No rash noted. Nails show no clubbing.  Psychiatric: He has a normal mood and affect.      Data Reviewed: Basic Metabolic Panel: Recent Labs  Lab 09/08/17 2120 09/09/17 1619  NA 136  --   K 4.2  --   CL 99*  --   CO2 30  --   GLUCOSE 134*  --   BUN 22*  --   CREATININE 1.01 1.00  CALCIUM 8.5*  --    Liver Function Tests: Recent Labs  Lab 09/08/17 2120  AST 25  ALT 20  ALKPHOS 65  BILITOT 0.8  PROT 7.8  ALBUMIN 4.0   CBC: Recent Labs  Lab 09/08/17 2120  WBC 7.0  HGB 14.9  HCT 42.8  MCV 95.0  PLT 183   Cardiac Enzymes: Recent Labs  Lab 09/08/17 2120 09/09/17 0429 09/09/17 0701  TROPONINI <0.03 <0.03 <0.03    CBG: Recent Labs  Lab 09/09/17 1201 09/09/17 1653 09/09/17 2106 09/10/17 0806 09/10/17 1202  GLUCAP 138* 113* 120* 160* 123*  Studies: Dg Chest 2 View  Result Date: 09/10/2017 CLINICAL DATA:  Recent fall with chest pain, initial encounter EXAM: CHEST - 2 VIEW COMPARISON:  None. FINDINGS: Cardiac shadow is within normal limits. Mild aortic calcifications are seen. The lungs are well aerated bilaterally without focal infiltrate or effusion. No pneumothorax is seen. No acute bony abnormality is noted. IMPRESSION: No active cardiopulmonary disease. Electronically Signed   By: Inez Catalina M.D.   On: 09/10/2017 08:01   Dg Ribs Unilateral Left  Result Date: 09/10/2017 CLINICAL DATA:  Left rib pain after fall last night. EXAM: LEFT RIBS - 2 VIEW COMPARISON:  None. FINDINGS: No fracture or other bone lesions are seen involving the ribs. IMPRESSION: Normal left ribs. Electronically Signed   By:  Marijo Conception, M.D.   On: 09/10/2017 12:25   Ct Head Wo Contrast  Result Date: 09/08/2017 CLINICAL DATA:  Altered LOC EXAM: CT HEAD WITHOUT CONTRAST TECHNIQUE: Contiguous axial images were obtained from the base of the skull through the vertex without intravenous contrast. COMPARISON:  None. FINDINGS: Brain: No acute territorial infarction, hemorrhage, or intracranial mass. Old left cerebellar infarct. Mild to moderate atrophy. Mild small vessel ischemic changes of the white matter. Slightly prominent ventricles, felt related to atrophy. Vascular: No hyperdense vessels.  Carotid vascular calcification Skull: Normal. Negative for fracture or focal lesion. Left mastoid sclerosis. Small amount of fluid in the inferior mastoids bilaterally. Sinuses/Orbits: Mild mucosal thickening in the maxillary and ethmoid sinuses. No acute orbital abnormality Other: None IMPRESSION: 1. No CT evidence for acute intracranial abnormality. 2. Atrophy and small vessel ischemic changes of the white matter. Old left cerebellar infarct. Electronically Signed   By: Donavan Foil M.D.   On: 09/08/2017 23:25   Ct Angio Neck W Or Wo Contrast  Addendum Date: 09/09/2017   ADDENDUM REPORT: 09/09/2017 17:40 ADDENDUM: These results were called by telephone at the time of interpretation on 09/09/2017 at 5:40 pm to Dr. Estanislado Pandy , who verbally acknowledged these results. Electronically Signed   By: Ulyses Jarred M.D.   On: 09/09/2017 17:40   Result Date: 09/09/2017 CLINICAL DATA:  Recent stroke. EXAM: CT ANGIOGRAPHY NECK TECHNIQUE: Multidetector CT imaging of the neck was performed using the standard protocol during bolus administration of intravenous contrast. Multiplanar CT image reconstructions and MIPs were obtained to evaluate the vascular anatomy. Carotid stenosis measurements (when applicable) are obtained utilizing NASCET criteria, using the distal internal carotid diameter as the denominator. CONTRAST:  54mL ISOVUE-370 IOPAMIDOL  (ISOVUE-370) INJECTION 76% COMPARISON:  MRA brain 09/09/2017 FINDINGS: Aortic arch: There is mild calcific atherosclerosis of the aortic arch. There is no aneurysm, dissection or hemodynamically significant stenosis of the visualized ascending aorta and aortic arch. Conventional 3 vessel aortic branching pattern. The visualized proximal subclavian arteries are widely patent. Right carotid system: --Common carotid artery: Widely patent origin without common carotid artery dissection or aneurysm. --Internal carotid artery: No dissection, occlusion or aneurysm. No hemodynamically significant stenosis. --External carotid artery: No acute abnormality. Left carotid system: --Common carotid artery: Widely patent origin without common carotid artery dissection or aneurysm. --Internal carotid artery:No dissection, occlusion or aneurysm. No hemodynamically significant stenosis. --External carotid artery: No acute abnormality. Vertebral arteries: Right dominant configuration. Both origins are normal. The left vertebral artery is occluded at the proximal V3 segment. The right vertebral artery is normal. Skeleton: There is no bony spinal canal stenosis. No lytic or blastic lesion. Other neck: Normal pharynx, larynx and major salivary glands. No cervical lymphadenopathy. Unremarkable thyroid gland. Upper  chest: No pneumothorax or pleural effusion. No nodules or masses. Review of the MIP images confirms the above findings IMPRESSION: 1. 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 2. Otherwise normal CTA of the neck. Electronically Signed: By: Ulyses Jarred M.D. On: 09/09/2017 17:08   Mr Angiogram Head Wo Contrast  Result Date: 09/09/2017 CLINICAL DATA:  Presyncope in shower. History of hypertension, hypercholesterolemia. EXAM: MRI HEAD WITHOUT CONTRAST MRA HEAD WITHOUT CONTRAST TECHNIQUE: Multiplanar, multiecho pulse sequences of the brain and surrounding structures were obtained without  intravenous contrast. Angiographic images of the head were obtained using MRA technique without contrast. COMPARISON:  CT HEAD Sep 08, 2017 FINDINGS: MRI HEAD FINDINGS INTRACRANIAL CONTENTS: 2 subcentimeter reduced diffusion LEFT cerebellum with low ADC values. Old bilateral cerebellar infarcts. Ventricles and sulci are normal for patient's age. Patchy supratentorial white matter FLAIR T2 hyperintensities compatible with mild chronic small vessel ischemic disease, less than expected for age. Old LEFT thalamus lacunar infarct. Prominent basal ganglia perivascular spaces associated with chronic small vessel ischemic changes. No midline shift, mass effect or masses. No abnormal extra-axial fluid collections. VASCULAR: Normal major intracranial vascular flow voids present at skull base. SKULL AND UPPER CERVICAL SPINE: No abnormal sellar expansion. No suspicious calvarial bone marrow signal. Old appearing base of dens fracture with mild canal stenosis. SINUSES/ORBITS: Small mastoid effusions in trace paranasal sinus mucosal thickening.The included ocular globes and orbital contents are non-suspicious. Status post bilateral ocular lens implants. OTHER: 1 cm cyst RIGHT fossa of Rosenmller. MRA HEAD FINDINGS ANTERIOR CIRCULATION: Normal flow related enhancement of the included cervical, petrous, cavernous and supraclinoid internal carotid arteries. Patent anterior communicating artery. Patent anterior and middle cerebral arteries, including distal segments. No large vessel occlusion, flow limiting stenosis, aneurysm. POSTERIOR CIRCULATION: LEFT vertebral artery is dominant. Basilar artery is patent, with normal flow related enhancement of the main branch vessels. Patent posterior cerebral arteries. Severe stenosis RIGHT P2 origin associated with tortuous segment resulting in flow artifact. No large vessel occlusion, aneurysm. ANATOMIC VARIANTS: None. Source images and MIP images were reviewed. IMPRESSION: MRI HEAD: 1. Two  acute subcentimeter LEFT cerebellar nonhemorrhagic infarcts. 2. Old small cerebellar and LEFT thalamus infarcts. Mild chronic small vessel ischemic changes. 3. Old appearing base of dens fracture would be better characterized on NONEMERGENT CT cervical spine. MRA HEAD: 1. No emergent large vessel occlusion. 2. Severe stenosis RIGHT P2 segment may be overestimated by flow artifact. Electronically Signed   By: Elon Alas M.D.   On: 09/09/2017 02:58   Mr Brain Wo Contrast  Result Date: 09/09/2017 CLINICAL DATA:  Presyncope in shower. History of hypertension, hypercholesterolemia. EXAM: MRI HEAD WITHOUT CONTRAST MRA HEAD WITHOUT CONTRAST TECHNIQUE: Multiplanar, multiecho pulse sequences of the brain and surrounding structures were obtained without intravenous contrast. Angiographic images of the head were obtained using MRA technique without contrast. COMPARISON:  CT HEAD Sep 08, 2017 FINDINGS: MRI HEAD FINDINGS INTRACRANIAL CONTENTS: 2 subcentimeter reduced diffusion LEFT cerebellum with low ADC values. Old bilateral cerebellar infarcts. Ventricles and sulci are normal for patient's age. Patchy supratentorial white matter FLAIR T2 hyperintensities compatible with mild chronic small vessel ischemic disease, less than expected for age. Old LEFT thalamus lacunar infarct. Prominent basal ganglia perivascular spaces associated with chronic small vessel ischemic changes. No midline shift, mass effect or masses. No abnormal extra-axial fluid collections. VASCULAR: Normal major intracranial vascular flow voids present at skull base. SKULL AND UPPER CERVICAL SPINE: No abnormal sellar expansion. No suspicious calvarial bone marrow signal. Old  appearing base of dens fracture with mild canal stenosis. SINUSES/ORBITS: Small mastoid effusions in trace paranasal sinus mucosal thickening.The included ocular globes and orbital contents are non-suspicious. Status post bilateral ocular lens implants. OTHER: 1 cm cyst RIGHT  fossa of Rosenmller. MRA HEAD FINDINGS ANTERIOR CIRCULATION: Normal flow related enhancement of the included cervical, petrous, cavernous and supraclinoid internal carotid arteries. Patent anterior communicating artery. Patent anterior and middle cerebral arteries, including distal segments. No large vessel occlusion, flow limiting stenosis, aneurysm. POSTERIOR CIRCULATION: LEFT vertebral artery is dominant. Basilar artery is patent, with normal flow related enhancement of the main branch vessels. Patent posterior cerebral arteries. Severe stenosis RIGHT P2 origin associated with tortuous segment resulting in flow artifact. No large vessel occlusion, aneurysm. ANATOMIC VARIANTS: None. Source images and MIP images were reviewed. IMPRESSION: MRI HEAD: 1. Two acute subcentimeter LEFT cerebellar nonhemorrhagic infarcts. 2. Old small cerebellar and LEFT thalamus infarcts. Mild chronic small vessel ischemic changes. 3. Old appearing base of dens fracture would be better characterized on NONEMERGENT CT cervical spine. MRA HEAD: 1. No emergent large vessel occlusion. 2. Severe stenosis RIGHT P2 segment may be overestimated by flow artifact. Electronically Signed   By: Elon Alas M.D.   On: 09/09/2017 02:58   Ct Abdomen Pelvis W Contrast  Result Date: 09/08/2017 CLINICAL DATA:  Acute abdominal pain.  Nausea and vomiting. EXAM: CT ABDOMEN AND PELVIS WITH CONTRAST TECHNIQUE: Multidetector CT imaging of the abdomen and pelvis was performed using the standard protocol following bolus administration of intravenous contrast. CONTRAST:  53mL ISOVUE-370 IOPAMIDOL (ISOVUE-370) INJECTION 76% COMPARISON:  CT 12/02/2014 FINDINGS: Lower chest: Minimal scarring at the right lung base. No pleural fluid. Hepatobiliary: Subcentimeter low-density lesion in the anterior left lobe of the liver is unchanged from prior exam. Diffuse hepatic steatosis with focal fatty sparing adjacent the gallbladder fossa. Gallbladder physiologically  distended, no calcified stone. No biliary dilatation. Pancreas: No ductal dilatation or inflammation. Spleen: Normal in size without focal abnormality. Adrenals/Urinary Tract: Normal adrenal glands. No hydronephrosis. Minimal by oral perinephric edema, nonspecific. Homogeneous renal enhancement and symmetric excretion on delayed phase imaging. Urinary bladder is partially distended. Small bladder diverticulum at the dome. Stomach/Bowel: stomach physiologically distended, no abnormal gastric distension on the current exam. No small bowel dilatation, inflammation or obstruction. Appendix tentatively identified and normal. Colonic diverticulosis distally without acute diverticulitis. Vascular/Lymphatic: Aortic atherosclerosis. Ectatic distal aorta measuring 2.7 cm. Small central mesenteric nodes without pathologic adenopathy. Reproductive: Heterogeneous prostate gland spanning 5.1 cm transverse. Other: Bilateral fat containing inguinal hernias, slightly larger on the right. Minimal laxity of anterior abdominal wall musculature with small fat containing umbilical hernia. No free air, free fluid, or intra-abdominal fluid collection. Musculoskeletal: There are no acute or suspicious osseous abnormalities. IMPRESSION: 1. No acute abnormality in the abdomen/pelvis. 2. Colonic diverticulosis without diverticulitis. 3. Ectatic infrarenal abdominal aorta maximal dimension 2.7 cm at risk for aneurysm development. Recommend followup by ultrasound in 5 years. This recommendation follows ACR consensus guidelines: White Paper of the ACR Incidental Findings Committee II on Vascular Findings. J Am Coll Radiol 2013; 10:789-794. Aortic Atherosclerosis (ICD10-I70.0). 4. Bilateral fat containing inguinal hernias. Mild hepatic steatosis. Electronically Signed   By: Jeb Levering M.D.   On: 09/08/2017 23:39   US Carotid Bilateral  Result Date: 09/09/2017 CLINICAL DATA:  83 year old male with acute left cerebellar infarcts EXAM:  BILATERAL CAROTID DUPLEX ULTRASOUND TECHNIQUE: Pearline Cables scale imaging, color Doppler and duplex ultrasound were performed of bilateral carotid and vertebral arteries in the neck. COMPARISON:  Brain MRI 09/09/2017  FINDINGS: Criteria: Quantification of carotid stenosis is based on velocity parameters that correlate the residual internal carotid diameter with NASCET-based stenosis levels, using the diameter of the distal internal carotid lumen as the denominator for stenosis measurement. The following velocity measurements were obtained: RIGHT ICA: 77/16 cm/sec CCA: 40/10 cm/sec SYSTOLIC ICA/CCA RATIO:  0.8 ECA:  93 cm/sec LEFT ICA: 112/20 cm/sec CCA: 272/53 cm/sec SYSTOLIC ICA/CCA RATIO:  0.9 ECA:  137 cm/sec RIGHT CAROTID ARTERY: No significant atherosclerotic plaque or evidence of stenosis. RIGHT VERTEBRAL ARTERY:  Patent with normal antegrade flow. LEFT CAROTID ARTERY: No significant atherosclerotic plaque or evidence of stenosis. LEFT VERTEBRAL ARTERY:  Patent with normal antegrade flow. IMPRESSION: Negative examination. No significant atherosclerotic plaque or evidence of stenosis. Signed, Criselda Peaches, MD [facility] Electronically Signed   By: Jacqulynn Cadet M.D.   On: 09/09/2017 14:23   Dg Shoulder Left  Result Date: 09/10/2017 CLINICAL DATA:  Recent fall with left shoulder pain, initial encounter EXAM: LEFT SHOULDER - 2+ VIEW COMPARISON:  None. FINDINGS: Humeral head is well seated. Multiple small bony fragments are identified adjacent to the medial aspect of the humeral head although a complete fracture is not identified. This may be related to some focal impaction. The need for further evaluation can be determined on a clinical basis. IMPRESSION: Multiple small fracture fragments adjacent to the humeral head likely related to some impaction injury. No complete transverse fracture is identified. Cross-sectional imaging may be helpful as  indicated. Electronically Signed   By: Inez Catalina M.D.   On: 09/10/2017 08:06    Scheduled Meds: . aspirin EC  81 mg Oral Daily  . atorvastatin  40 mg Oral q1800  . clopidogrel  75 mg Oral Daily  . enoxaparin (LOVENOX) injection  40 mg Subcutaneous Q24H  . lisinopril  5 mg Oral Daily  . sodium chloride flush  3 mL Intravenous Q12H  . terazosin  10 mg Oral QHS    Assessment/Plan:  1. Acute left cerebellar stroke with vertebral artery occlusion on the left.  Continue aspirin, Plavix and atorvastatin. 2. Left shoulder fracture.  Pain control.  Left arm sling.  Appreciate orthopedic consultation. 3. Hyperlipidemia unspecified.  LDL 144 on cholesterol panel since 08/31/2017. 4. Essential hypertension on lisinopril 5. BPH on terazosin 6. Fall.  Because of the fall and stroke and now left shoulder fracture physical therapy now recommended rehab.  Hoping to get the patient stronger in order to go home.  Code Status:     Code Status Orders  (From admission, onward)        Start     Ordered   09/09/17 0309  Full code  Continuous     09/09/17 0308    Code Status History    Date Active Date Inactive Code Status Order ID Comments User Context   12/02/2014 1417 12/08/2014 1800 Full Code [id number]  Jeanie Cooks, MD ED   11/21/2014 1816 11/24/2014 1426 Full Code [id number]  Sherri Rad, MD Inpatient     Family Communication: Wife at the bedside Disposition Plan: To be determined  Consultants:  Orthopedic surgery   neurology  Time spent: 35 minutes, case discussed with neurology and orthopedic surgery  [place]

## 2017-09-11 LAB — GLUCOSE, CAPILLARY
GLUCOSE-CAPILLARY: 147 mg/dL — AB (ref 65–99)
Glucose-Capillary: 118 mg/dL — ABNORMAL HIGH (ref 65–99)
Glucose-Capillary: 192 mg/dL — ABNORMAL HIGH (ref 65–99)
Glucose-Capillary: 142 mg/dL — ABNORMAL HIGH (ref 65–99)

## 2017-09-11 NOTE — Progress Notes (Signed)
Patient ID: [name], male   DOB: 26-Jan-1935, 83 y.o.   MRN: [id number]  [facility] PROGRESS NOTE  [name] WJX:[id number] DOB: Aug 17, 1934 DOA: 09/08/2017 PCP: [name], DO  HPI/Subjective: Patient feels better today than yesterday.  Having some pain in his left shoulder because of the fracture with a fall here in the hospital.  Still off balance.  Complaining that his speech is off even though I hear clearly.  Objective: Vitals:   09/11/17 0512 09/11/17 0808  BP: (!) 160/81 (!) 165/80  Pulse: 66 64  Resp:  18  Temp: 98.3 F (36.8 C) 98.4 F (36.9 C)  SpO2: 95% 95%    Filed Weights   09/09/17 0317 09/10/17 0352 09/11/17 0512  Weight: 105.9 kg (233 lb 8 oz) 109.8 kg (242 lb) 115.7 kg (255 lb)    ROS: Review of Systems  Constitutional: Negative for chills and fever.  Eyes: Negative for blurred vision.  Respiratory: Positive for shortness of breath. Negative for cough.   Cardiovascular: Positive for chest pain.  Gastrointestinal: Negative for abdominal pain, constipation, diarrhea, nausea and vomiting.  Genitourinary: Negative for dysuria.  Musculoskeletal: Positive for joint pain.  Neurological: Negative for dizziness and headaches.   Exam: Physical Exam  Constitutional: He is oriented to person, place, and time.  HENT:  Nose: No mucosal edema.  Mouth/Throat: No oropharyngeal exudate or posterior oropharyngeal edema.  Eyes: Pupils are equal, round, and reactive to light. Conjunctivae, EOM and lids are normal.  Neck: No JVD present. Carotid bruit is not present. No edema present. No thyroid mass and no thyromegaly present.  Cardiovascular: S1 normal and S2 normal. Exam reveals no gallop.  No murmur heard. Pulses:      Dorsalis pedis pulses are 2+ on the right side, and 2+ on the left side.  Respiratory: No respiratory distress. He has no wheezes. He has no rhonchi. He has no rales.  GI: Soft. Bowel sounds are normal. There is no tenderness.   Musculoskeletal:       Right ankle: He exhibits no swelling.       Left ankle: He exhibits no swelling.  Lymphadenopathy:    He has no cervical adenopathy.  Neurological: He is alert and oriented to person, place, and time. No cranial nerve deficit.  Left-sided weakness.  Skin: Skin is warm. No rash noted. Nails show no clubbing.  Psychiatric: He has a normal mood and affect.      Data Reviewed: Basic Metabolic Panel: Recent Labs  Lab 09/08/17 2120 09/09/17 1619  NA 136  --   K 4.2  --   CL 99*  --   CO2 30  --   GLUCOSE 134*  --   BUN 22*  --   CREATININE 1.01 1.00  CALCIUM 8.5*  --    Liver Function Tests: Recent Labs  Lab 09/08/17 2120  AST 25  ALT 20  ALKPHOS 65  BILITOT 0.8  PROT 7.8  ALBUMIN 4.0   CBC: Recent Labs  Lab 09/08/17 2120  WBC 7.0  HGB 14.9  HCT 42.8  MCV 95.0  PLT 183   Cardiac Enzymes: Recent Labs  Lab 09/08/17 2120 09/09/17 0429 09/09/17 0701  TROPONINI <0.03 <0.03 <0.03    CBG: Recent Labs  Lab 09/10/17 1202 09/10/17 1636 09/10/17 2123 09/11/17 0809 09/11/17 1158  GLUCAP 123* 125* 145* 118* 192*     Studies: Dg Chest 2 View  Result Date: 09/10/2017 CLINICAL DATA:  Recent fall with chest pain,  initial encounter EXAM: CHEST - 2 VIEW COMPARISON:  None. FINDINGS: Cardiac shadow is within normal limits. Mild aortic calcifications are seen. The lungs are well aerated bilaterally without focal infiltrate or effusion. No pneumothorax is seen. No acute bony abnormality is noted. IMPRESSION: No active cardiopulmonary disease. Electronically Signed   By: Inez Catalina M.D.   On: 09/10/2017 08:01   Dg Ribs Unilateral Left  Result Date: 09/10/2017 CLINICAL DATA:  Left rib pain after fall last night. EXAM: LEFT RIBS - 2 VIEW COMPARISON:  None. FINDINGS: No fracture or other bone lesions are seen involving the ribs. IMPRESSION: Normal left ribs. Electronically Signed   By: Marijo Conception, M.D.   On: 09/10/2017 12:25   Ct Angio  Neck W Or Wo Contrast  Addendum Date: 09/09/2017   ADDENDUM REPORT: 09/09/2017 17:40 ADDENDUM: These results were called by telephone at the time of interpretation on 09/09/2017 at 5:40 pm to Dr. Estanislado Pandy , who verbally acknowledged these results. Electronically Signed   By: Ulyses Jarred M.D.   On: 09/09/2017 17:40   Result Date: 09/09/2017 CLINICAL DATA:  Recent stroke. EXAM: CT ANGIOGRAPHY NECK TECHNIQUE: Multidetector CT imaging of the neck was performed using the standard protocol during bolus administration of intravenous contrast. Multiplanar CT image reconstructions and MIPs were obtained to evaluate the vascular anatomy. Carotid stenosis measurements (when applicable) are obtained utilizing NASCET criteria, using the distal internal carotid diameter as the denominator. CONTRAST:  24mL ISOVUE-370 IOPAMIDOL (ISOVUE-370) INJECTION 76% COMPARISON:  MRA brain 09/09/2017 FINDINGS: Aortic arch: There is mild calcific atherosclerosis of the aortic arch. There is no aneurysm, dissection or hemodynamically significant stenosis of the visualized ascending aorta and aortic arch. Conventional 3 vessel aortic branching pattern. The visualized proximal subclavian arteries are widely patent. Right carotid system: --Common carotid artery: Widely patent origin without common carotid artery dissection or aneurysm. --Internal carotid artery: No dissection, occlusion or aneurysm. No hemodynamically significant stenosis. --External carotid artery: No acute abnormality. Left carotid system: --Common carotid artery: Widely patent origin without common carotid artery dissection or aneurysm. --Internal carotid artery:No dissection, occlusion or aneurysm. No hemodynamically significant stenosis. --External carotid artery: No acute abnormality. Vertebral arteries: Right dominant configuration. Both origins are normal. The left vertebral artery is occluded at the proximal V3 segment. The right vertebral artery is normal. Skeleton:  There is no bony spinal canal stenosis. No lytic or blastic lesion. Other neck: Normal pharynx, larynx and major salivary glands. No cervical lymphadenopathy. Unremarkable thyroid gland. Upper chest: No pneumothorax or pleural effusion. No nodules or masses. Review of the MIP images confirms the above findings IMPRESSION: 1. 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 2. Otherwise normal CTA of the neck. Electronically Signed: By: Ulyses Jarred M.D. On: 09/09/2017 17:08   Ct Shoulder Left Wo Contrast  Result Date: 09/10/2017 CLINICAL DATA:  Left shoulder pain after a fall. EXAM: CT OF THE UPPER LEFT EXTREMITY WITHOUT CONTRAST TECHNIQUE: Multidetector CT imaging of the upper left extremity was performed according to the standard protocol. COMPARISON:  Radiographs dated 09/10/2017 FINDINGS: Bones/Joint/Cartilage There is a comminuted fracture of the lesser tuberosity of the proximal humerus. The fracture fragments include the attachment of the subscapularis tendon. There is hemorrhage and edema in and around the subscapularis muscle. There is no dislocation. The remainder of the proximal humerus is intact. Scapula and distal clavicle are intact. Minimal AC joint arthropathy. Muscles and Tendons Hemorrhage in and around the subscapularis muscle and tendon. The fracture involves  the insertion of the subscapularis on the lesser tuberosity. The other muscles of the rotator cuff appear intact. IMPRESSION: Comminuted slightly displaced fracture lesser tuberosity of the proximal humerus as described. This involves the insertion of the subscapularis tendon. Electronically Signed   By: Lorriane Shire M.D.   On: 09/10/2017 16:13   US Carotid Bilateral  Result Date: 09/09/2017 CLINICAL DATA:  83 year old male with acute left cerebellar infarcts EXAM: BILATERAL CAROTID DUPLEX ULTRASOUND TECHNIQUE: Pearline Cables scale imaging, color Doppler and duplex ultrasound were performed of bilateral  carotid and vertebral arteries in the neck. COMPARISON:  Brain MRI 09/09/2017 FINDINGS: Criteria: Quantification of carotid stenosis is based on velocity parameters that correlate the residual internal carotid diameter with NASCET-based stenosis levels, using the diameter of the distal internal carotid lumen as the denominator for stenosis measurement. The following velocity measurements were obtained: RIGHT ICA: 77/16 cm/sec CCA: 81/01 cm/sec SYSTOLIC ICA/CCA RATIO:  0.8 ECA:  93 cm/sec LEFT ICA: 112/20 cm/sec CCA: 751/02 cm/sec SYSTOLIC ICA/CCA RATIO:  0.9 ECA:  137 cm/sec RIGHT CAROTID ARTERY: No significant atherosclerotic plaque or evidence of stenosis. RIGHT VERTEBRAL ARTERY:  Patent with normal antegrade flow. LEFT CAROTID ARTERY: No significant atherosclerotic plaque or evidence of stenosis. LEFT VERTEBRAL ARTERY:  Patent with normal antegrade flow. IMPRESSION: Negative examination. No significant atherosclerotic plaque or evidence of stenosis. Signed, Criselda Peaches, MD [facility] Electronically Signed   By: Jacqulynn Cadet M.D.   On: 09/09/2017 14:23   Dg Shoulder Left  Result Date: 09/10/2017 CLINICAL DATA:  Recent fall with left shoulder pain, initial encounter EXAM: LEFT SHOULDER - 2+ VIEW COMPARISON:  None. FINDINGS: Humeral head is well seated. Multiple small bony fragments are identified adjacent to the medial aspect of the humeral head although a complete fracture is not identified. This may be related to some focal impaction. The need for further evaluation can be determined on a clinical basis. IMPRESSION: Multiple small fracture fragments adjacent to the humeral head likely related to some impaction injury. No complete transverse fracture is identified. Cross-sectional imaging may be helpful as indicated. Electronically Signed   By: Inez Catalina M.D.   On: 09/10/2017 08:06    Scheduled Meds: . aspirin EC  81 mg Oral Daily   . atorvastatin  40 mg Oral q1800  . clopidogrel  75 mg Oral Daily  . enoxaparin (LOVENOX) injection  40 mg Subcutaneous Q24H  . lisinopril  5 mg Oral Daily  . sodium chloride flush  3 mL Intravenous Q12H  . terazosin  10 mg Oral QHS    Assessment/Plan:  1. Acute left cerebellar stroke with vertebral artery occlusion on the left.  Continue aspirin, Plavix and atorvastatin. 2. Left shoulder fracture.  Pain control.  Left arm sling.  Appreciate orthopedic consultation. 3. Hyperlipidemia unspecified.  LDL 144 on cholesterol panel on 08/31/2017. 4. Essential hypertension on lisinopril 5. BPH on terazosin 6. Fall.  Because of the fall and stroke and now left shoulder fracture physical therapy now recommended rehab.  Code Status:     Code Status Orders  (From admission, onward)        Start     Ordered   09/09/17 0309  Full code  Continuous     09/09/17 0308    Code Status History    Date Active Date Inactive Code Status Order ID Comments User Context   12/02/2014 1417 12/08/2014 1800 Full Code [id number]  Jeanie Cooks, MD ED   11/21/2014 1816 11/24/2014  1426 Full Code [id number]  Sherri Rad, MD Inpatient     Family Communication: Wife  yesterday. Disposition Plan: Was hoping to get the patient home with home health but physical therapy still recommending rehab at this time.  Consultants:  Orthopedic surgery   neurology  Time spent: 28 minutes  [facility]           Patient ID: [name], male   DOB: 1935/01/28, 83 y.o.   MRN: [id number]

## 2017-09-11 NOTE — Plan of Care (Signed)
  Problem: Education: Goal: Knowledge of General Education information will improve Outcome: Progressing   Problem: Health Behavior/Discharge Planning: Goal: Ability to manage health-related needs will improve Outcome: Progressing   Problem: Clinical Measurements: Goal: Ability to maintain clinical measurements within normal limits will improve Outcome: Progressing Goal: Diagnostic test results will improve Outcome: Progressing Goal: Respiratory complications will improve Outcome: Progressing Goal: Cardiovascular complication will be avoided Outcome: Progressing   

## 2017-09-11 NOTE — Clinical Social Work Note (Signed)
The CSW met with the patient at bedside to discuss bed offers. The patient has chosen [facility]. The CSW has updated such in the [facility] and will continue to follow.  Santiago Bumpers, MSW, Latanya Presser [phone]

## 2017-09-11 NOTE — Progress Notes (Signed)
Subjective:  Patient reports left shoulder pain as moderate.  Patient's wife is at the bedside.  Objective:   VITALS:   Vitals:   09/10/17 1635 09/10/17 2123 09/11/17 0512 09/11/17 0808  BP: (!) 169/89 (!) 152/83 (!) 160/81 (!) 165/80  Pulse: (!) 59 66 66 64  Resp:    18  Temp: 97.9 F (36.6 C) 98 F (36.7 C) 98.3 F (36.8 C) 98.4 F (36.9 C)  TempSrc: Oral Oral Oral Oral  SpO2: 95% 97% 95% 95%  Weight:   115.7 kg (255 lb)   Height:        PHYSICAL EXAM: Left shoulder: OOB to a chair.  Left arm in a sling. Neurovascular intact Sensation intact distally Intact pulses distally No cellulitis present Compartment soft  LABS  Results for orders placed or performed during the hospital encounter of 09/08/17 (from the past 24 hour(s))  Glucose, capillary     Status: Abnormal   Collection Time: 09/10/17  4:36 PM  Result Value Ref Range   Glucose-Capillary 125 (H) 65 - 99 mg/dL   Comment 1 Notify RN   Glucose, capillary     Status: Abnormal   Collection Time: 09/10/17  9:23 PM  Result Value Ref Range   Glucose-Capillary 145 (H) 65 - 99 mg/dL  Glucose, capillary     Status: Abnormal   Collection Time: 09/11/17  8:09 AM  Result Value Ref Range   Glucose-Capillary 118 (H) 65 - 99 mg/dL   Comment 1 Notify RN    Comment 2 Document in Chart   Glucose, capillary     Status: Abnormal   Collection Time: 09/11/17 11:58 AM  Result Value Ref Range   Glucose-Capillary 192 (H) 65 - 99 mg/dL   Comment 1 Notify RN    Comment 2 Document in Chart     Dg Chest 2 View  Result Date: 09/10/2017 CLINICAL DATA:  Recent fall with chest pain, initial encounter EXAM: CHEST - 2 VIEW COMPARISON:  None. FINDINGS: Cardiac shadow is within normal limits. Mild aortic calcifications are seen. The lungs are well aerated bilaterally without focal infiltrate or effusion. No pneumothorax is seen. No acute bony abnormality is noted. IMPRESSION: No active cardiopulmonary disease. Electronically Signed    By: Inez Catalina M.D.   On: 09/10/2017 08:01   Dg Ribs Unilateral Left  Result Date: 09/10/2017 CLINICAL DATA:  Left rib pain after fall last night. EXAM: LEFT RIBS - 2 VIEW COMPARISON:  None. FINDINGS: No fracture or other bone lesions are seen involving the ribs. IMPRESSION: Normal left ribs. Electronically Signed   By: Marijo Conception, M.D.   On: 09/10/2017 12:25   Ct Angio Neck W Or Wo Contrast  Addendum Date: 09/09/2017   ADDENDUM REPORT: 09/09/2017 17:40 ADDENDUM: These results were called by telephone at the time of interpretation on 09/09/2017 at 5:40 pm to Dr. Estanislado Pandy , who verbally acknowledged these results. Electronically Signed   By: Ulyses Jarred M.D.   On: 09/09/2017 17:40   Result Date: 09/09/2017 CLINICAL DATA:  Recent stroke. EXAM: CT ANGIOGRAPHY NECK TECHNIQUE: Multidetector CT imaging of the neck was performed using the standard protocol during bolus administration of intravenous contrast. Multiplanar CT image reconstructions and MIPs were obtained to evaluate the vascular anatomy. Carotid stenosis measurements (when applicable) are obtained utilizing NASCET criteria, using the distal internal carotid diameter as the denominator. CONTRAST:  46mL ISOVUE-370 IOPAMIDOL (ISOVUE-370) INJECTION 76% COMPARISON:  MRA brain 09/09/2017 FINDINGS: Aortic arch: There is mild calcific atherosclerosis of  the aortic arch. There is no aneurysm, dissection or hemodynamically significant stenosis of the visualized ascending aorta and aortic arch. Conventional 3 vessel aortic branching pattern. The visualized proximal subclavian arteries are widely patent. Right carotid system: --Common carotid artery: Widely patent origin without common carotid artery dissection or aneurysm. --Internal carotid artery: No dissection, occlusion or aneurysm. No hemodynamically significant stenosis. --External carotid artery: No acute abnormality. Left carotid system: --Common carotid artery: Widely patent origin without  common carotid artery dissection or aneurysm. --Internal carotid artery:No dissection, occlusion or aneurysm. No hemodynamically significant stenosis. --External carotid artery: No acute abnormality. Vertebral arteries: Right dominant configuration. Both origins are normal. The left vertebral artery is occluded at the proximal V3 segment. The right vertebral artery is normal. Skeleton: There is no bony spinal canal stenosis. No lytic or blastic lesion. Other neck: Normal pharynx, larynx and major salivary glands. No cervical lymphadenopathy. Unremarkable thyroid gland. Upper chest: No pneumothorax or pleural effusion. No nodules or masses. Review of the MIP images confirms the above findings IMPRESSION: 1. 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 2. Otherwise normal CTA of the neck. Electronically Signed: By: Ulyses Jarred M.D. On: 09/09/2017 17:08   Ct Shoulder Left Wo Contrast  Result Date: 09/10/2017 CLINICAL DATA:  Left shoulder pain after a fall. EXAM: CT OF THE UPPER LEFT EXTREMITY WITHOUT CONTRAST TECHNIQUE: Multidetector CT imaging of the upper left extremity was performed according to the standard protocol. COMPARISON:  Radiographs dated 09/10/2017 FINDINGS: Bones/Joint/Cartilage There is a comminuted fracture of the lesser tuberosity of the proximal humerus. The fracture fragments include the attachment of the subscapularis tendon. There is hemorrhage and edema in and around the subscapularis muscle. There is no dislocation. The remainder of the proximal humerus is intact. Scapula and distal clavicle are intact. Minimal AC joint arthropathy. Muscles and Tendons Hemorrhage in and around the subscapularis muscle and tendon. The fracture involves the insertion of the subscapularis on the lesser tuberosity. The other muscles of the rotator cuff appear intact. IMPRESSION: Comminuted slightly displaced fracture lesser tuberosity of the proximal humerus as described.  This involves the insertion of the subscapularis tendon. Electronically Signed   By: Lorriane Shire M.D.   On: 09/10/2017 16:13   US Carotid Bilateral  Result Date: 09/09/2017 CLINICAL DATA:  83 year old male with acute left cerebellar infarcts EXAM: BILATERAL CAROTID DUPLEX ULTRASOUND TECHNIQUE: Pearline Cables scale imaging, color Doppler and duplex ultrasound were performed of bilateral carotid and vertebral arteries in the neck. COMPARISON:  Brain MRI 09/09/2017 FINDINGS: Criteria: Quantification of carotid stenosis is based on velocity parameters that correlate the residual internal carotid diameter with NASCET-based stenosis levels, using the diameter of the distal internal carotid lumen as the denominator for stenosis measurement. The following velocity measurements were obtained: RIGHT ICA: 77/16 cm/sec CCA: 94/58 cm/sec SYSTOLIC ICA/CCA RATIO:  0.8 ECA:  93 cm/sec LEFT ICA: 112/20 cm/sec CCA: 592/92 cm/sec SYSTOLIC ICA/CCA RATIO:  0.9 ECA:  137 cm/sec RIGHT CAROTID ARTERY: No significant atherosclerotic plaque or evidence of stenosis. RIGHT VERTEBRAL ARTERY:  Patent with normal antegrade flow. LEFT CAROTID ARTERY: No significant atherosclerotic plaque or evidence of stenosis. LEFT VERTEBRAL ARTERY:  Patent with normal antegrade flow. IMPRESSION: Negative examination. No significant atherosclerotic plaque or evidence of stenosis. Signed, Criselda Peaches, MD [facility] Electronically Signed   By: Jacqulynn Cadet M.D.   On: 09/09/2017 14:23   Dg Shoulder Left  Result Date: 09/10/2017 CLINICAL DATA:  Recent fall  with left shoulder pain, initial encounter EXAM: LEFT SHOULDER - 2+ VIEW COMPARISON:  None. FINDINGS: Humeral head is well seated. Multiple small bony fragments are identified adjacent to the medial aspect of the humeral head although a complete fracture is not identified. This may be related to some focal impaction. The need for further  evaluation can be determined on a clinical basis. IMPRESSION: Multiple small fracture fragments adjacent to the humeral head likely related to some impaction injury. No complete transverse fracture is identified. Cross-sectional imaging may be helpful as indicated. Electronically Signed   By: Inez Catalina M.D.   On: 09/10/2017 08:06    Assessment/Plan:     Active Problems:   Near syncope   Cerebellar stroke, acute ([place])  I reviewed the patient's CT scan of the left shoulder.  Patient has an avulsion fracture of the lesser tuberosity.  He may wear the sling for comfort and may remove it as his pain allows.  He may also move the left shoulder as his pain allows.  The fracture does not affect his upper arm stability.  He may weight-bear as tolerated on the left upper extremity.  He will follow-up in my office in 2 weeks for reevaluation.    Thornton Park , MD 09/11/2017, 1:43 PM

## 2017-09-11 NOTE — Progress Notes (Signed)
Occupational Therapy Treatment Patient Details Name: [name] MRN: [id number] DOB: 1934-09-26 Today's Date: 09/11/2017    History of present illness Pt. is is an 83 y.o. male who was admitted to [facility] with a syncopal episode, and weakness.  Pt. PMHx includes: Chronic CVA, and Vertebral Artery Occlusion. Pt. had a fall on the left shoulder/ ribs, resulting in pain.   OT comments  Pt. Had an orthopedic Consult which indicated that the patient has a left Avulsion Fracture of the Lesser Tuberosity. Pt. has a sling in place to the LUE. Pt. education was provided about the sling. Pt. was assisted in repositioning the sling. Pt. Was educated about one armed self-care techniques. Pt. Continues to benefit from OT services for ADL training, A/E training, and pt. Education about home modification, and DME. Pt. Continues to benefit from SNF level of care upon discharge. Pt. Could benefit from follow-up OT services at discharge.   Follow Up Recommendations  SNF    Equipment Recommendations       Recommendations for Other Services      Precautions / Restrictions Precautions Precautions: Fall Restrictions Weight Bearing Restrictions: No       Mobility Bed Mobility Overal bed mobility: Needs Assistance Bed Mobility: Supine to Sit     Supine to sit: Mod assist        Transfers  Deferred    Balance                         ADL either performed or assessed with clinical judgement    ADL Overall ADL's : Needs assistance/impaired Eating/Feeding: Set up;Minimal assistance   Grooming: Set up;Moderate assistance   Upper Body Bathing: Set up;Maximal assistance   Lower Body Bathing: Set up;Maximal assistance   Upper Body Dressing : Set up;Maximal assistance   Lower Body Dressing: Set up;Maximal assistance                       Vision       Perception     Praxis      Cognition Arousal/Alertness: Awake/alert Behavior During Therapy: WFL for tasks  assessed/performed Overall Cognitive Status: Within Functional Limits for tasks assessed                                          Exercises     Shoulder Instructions       General Comments      Pertinent Vitals/ Pain       Pain Assessment: No/denies pain  Home Living                                          Prior Functioning/Environment              Frequency  Min 2X/week        Progress Toward Goals  OT Goals(current goals can now be found in the care plan section)  Progress towards OT goals: Progressing toward goals  Acute Rehab OT Goals Patient Stated Goal: To get better  Plan      Co-evaluation                 AM-PAC PT "6 Clicks" Daily Activity     Outcome Measure   Help  from another person eating meals?: A Little Help from another person taking care of personal grooming?: A Lot Help from another person toileting, which includes using toliet, bedpan, or urinal?: A Lot Help from another person bathing (including washing, rinsing, drying)?: A Lot Help from another person to put on and taking off regular upper body clothing?: A Lot Help from another person to put on and taking off regular lower body clothing?: A Lot 6 Click Score: 13    End of Session    OT Visit Diagnosis: Muscle weakness (generalized) (M62.81);History of falling (Z91.81)   Activity Tolerance Patient tolerated treatment well   Patient Left in bed   Nurse Communication          Time: 2244-9753 OT Time Calculation (min): 15 min  Charges: OT General Charges $OT Visit: 1 Visit OT Treatments $Self Care/Home Management : 8-22 mins  Harrel Carina, MS, OTR/L  Harrel Carina 09/11/2017, 12:52 PM

## 2017-09-11 NOTE — Progress Notes (Signed)
Physical Therapy Treatment Patient Details Name: [name] MRN: [id number] DOB: 30-Apr-1934 Today's Date: 09/11/2017    History of Present Illness Pt. is is an 83 y.o. male who was admitted to [facility] with a syncopal episode, and weakness.  Pt. PMHx includes: Chronic CVA, and Vertebral Artery Occlusion. Pt. had a fall on the left shoulder/ ribs, resulting in pain.    PT Comments    Pt in bed.  Wife setting up to assist with breakfast.  Pt encouraged to sit in chair and wife stated "No, that's ok.  He can stay in bed".  Education provided on importance of mobility and encouraged up in chair to eat.  Pt agreed.  To edge of bed with mod a x 1.  Immobilizer sling adjusted for improved it.  Once sitting, pt noted to be inc of urine.  Stated he was unaware.  Bed and gown wet.  Care provided with nurse tech assist.  Stood with mod a x 2 and was able to transfer to recliner at bedside.  Pt generally unsteady and requires +2 assist for safety.  Once seated in chair, wife started to prepare food at tray table in front of pt.  Further session deferred at this time.  Pt did state that he and his wife were doing essentially ROM exercises with L UE.Marland Kitchen  "It feels good to move it"  Discussed with OT and reviewed with pt and wife importance of immobilizer and reasons for it's use.  They voiced understanding.  Encouraged to discuss further questions with MD.   Follow Up Recommendations  SNF     Equipment Recommendations       Recommendations for Other Services       Precautions / Restrictions Precautions Precautions: Fall Restrictions Weight Bearing Restrictions: No    Mobility  Bed Mobility Overal bed mobility: Needs Assistance Bed Mobility: Supine to Sit     Supine to sit: Mod assist        Transfers Overall transfer level: Needs assistance Equipment used: 2 person hand held assist;None Transfers: Sit to/from Stand Sit to Stand: Mod assist;+2 safety/equipment;+2 physical assistance             Ambulation/Gait Ambulation/Gait assistance: Mod assist;+2 physical assistance Ambulation Distance (Feet): 3 Feet Assistive device: None Gait Pattern/deviations: Step-to pattern   Gait velocity interpretation: <1.8 ft/sec, indicate of risk for recurrent falls General Gait Details: staggering with poor balance   Stairs             Wheelchair Mobility    Modified Rankin (Stroke Patients Only)       Balance Overall balance assessment: Needs assistance Sitting-balance support: Single extremity supported Sitting balance-Leahy Scale: Fair     Standing balance support: Single extremity supported Standing balance-Leahy Scale: Poor Standing balance comment: generally unsteady and requires +2 for safety                            Cognition Arousal/Alertness: Lethargic;Awake/alert Behavior During Therapy: WFL for tasks assessed/performed Overall Cognitive Status: Within Functional Limits for tasks assessed                                        Exercises      General Comments        Pertinent Vitals/Pain Pain Assessment: No/denies pain    Home Living  Prior Function            PT Goals (current goals can now be found in the care plan section) Progress towards PT goals: Progressing toward goals    Frequency    7X/week      PT Plan Current plan remains appropriate    Co-evaluation              AM-PAC PT "6 Clicks" Daily Activity  Outcome Measure  Difficulty turning over in bed (including adjusting bedclothes, sheets and blankets)?: Unable Difficulty moving from lying on back to sitting on the side of the bed? : Unable Difficulty sitting down on and standing up from a chair with arms (e.g., wheelchair, bedside commode, etc,.)?: Unable Help needed moving to and from a bed to chair (including a wheelchair)?: Total Help needed walking in hospital room?: Total Help needed climbing  3-5 steps with a railing? : Total 6 Click Score: 6    End of Session Equipment Utilized During Treatment: Gait belt Activity Tolerance: Patient tolerated treatment well Patient left: in chair;with chair alarm set;with call bell/phone within reach;with nursing/sitter in room Nurse Communication: Mobility status       Time: 1125-1140 PT Time Calculation (min) (ACUTE ONLY): 15 min  Charges:  $Therapeutic Activity: 8-22 mins                    G Codes:       Chesley Noon, PTA 09/11/17, 12:05 PM

## 2017-09-12 DIAGNOSIS — Z87891 Personal history of nicotine dependence: Secondary | ICD-10-CM | POA: Diagnosis not present

## 2017-09-12 DIAGNOSIS — S4292XA Fracture of left shoulder girdle, part unspecified, initial encounter for closed fracture: Secondary | ICD-10-CM | POA: Diagnosis not present

## 2017-09-12 DIAGNOSIS — Z79899 Other long term (current) drug therapy: Secondary | ICD-10-CM | POA: Diagnosis not present

## 2017-09-12 DIAGNOSIS — I129 Hypertensive chronic kidney disease with stage 1 through stage 4 chronic kidney disease, or unspecified chronic kidney disease: Secondary | ICD-10-CM | POA: Diagnosis not present

## 2017-09-12 DIAGNOSIS — D291 Benign neoplasm of prostate: Secondary | ICD-10-CM | POA: Diagnosis not present

## 2017-09-12 DIAGNOSIS — R2681 Unsteadiness on feet: Secondary | ICD-10-CM | POA: Diagnosis not present

## 2017-09-12 DIAGNOSIS — R3915 Urgency of urination: Secondary | ICD-10-CM | POA: Diagnosis not present

## 2017-09-12 DIAGNOSIS — R350 Frequency of micturition: Secondary | ICD-10-CM | POA: Diagnosis not present

## 2017-09-12 DIAGNOSIS — Z5189 Encounter for other specified aftercare: Secondary | ICD-10-CM | POA: Diagnosis not present

## 2017-09-12 DIAGNOSIS — I6930 Unspecified sequelae of cerebral infarction: Secondary | ICD-10-CM | POA: Diagnosis not present

## 2017-09-12 DIAGNOSIS — I6350 Cerebral infarction due to unspecified occlusion or stenosis of unspecified cerebral artery: Secondary | ICD-10-CM | POA: Diagnosis not present

## 2017-09-12 DIAGNOSIS — I639 Cerebral infarction, unspecified: Secondary | ICD-10-CM | POA: Diagnosis not present

## 2017-09-12 DIAGNOSIS — I10 Essential (primary) hypertension: Secondary | ICD-10-CM | POA: Diagnosis not present

## 2017-09-12 DIAGNOSIS — Z7902 Long term (current) use of antithrombotics/antiplatelets: Secondary | ICD-10-CM | POA: Diagnosis not present

## 2017-09-12 DIAGNOSIS — S4292XD Fracture of left shoulder girdle, part unspecified, subsequent encounter for fracture with routine healing: Secondary | ICD-10-CM | POA: Diagnosis not present

## 2017-09-12 DIAGNOSIS — N183 Chronic kidney disease, stage 3 (moderate): Secondary | ICD-10-CM | POA: Diagnosis not present

## 2017-09-12 DIAGNOSIS — R531 Weakness: Secondary | ICD-10-CM | POA: Diagnosis not present

## 2017-09-12 DIAGNOSIS — E785 Hyperlipidemia, unspecified: Secondary | ICD-10-CM | POA: Diagnosis not present

## 2017-09-12 DIAGNOSIS — K5900 Constipation, unspecified: Secondary | ICD-10-CM | POA: Diagnosis not present

## 2017-09-12 DIAGNOSIS — Z7982 Long term (current) use of aspirin: Secondary | ICD-10-CM | POA: Diagnosis not present

## 2017-09-12 DIAGNOSIS — J309 Allergic rhinitis, unspecified: Secondary | ICD-10-CM | POA: Diagnosis not present

## 2017-09-12 DIAGNOSIS — R110 Nausea: Secondary | ICD-10-CM | POA: Diagnosis not present

## 2017-09-12 DIAGNOSIS — S42302A Unspecified fracture of shaft of humerus, left arm, initial encounter for closed fracture: Secondary | ICD-10-CM | POA: Diagnosis not present

## 2017-09-12 DIAGNOSIS — S42292A Other displaced fracture of upper end of left humerus, initial encounter for closed fracture: Secondary | ICD-10-CM | POA: Diagnosis not present

## 2017-09-12 DIAGNOSIS — R972 Elevated prostate specific antigen [PSA]: Secondary | ICD-10-CM | POA: Diagnosis not present

## 2017-09-12 DIAGNOSIS — S42302D Unspecified fracture of shaft of humerus, left arm, subsequent encounter for fracture with routine healing: Secondary | ICD-10-CM | POA: Diagnosis not present

## 2017-09-12 DIAGNOSIS — M6281 Muscle weakness (generalized): Secondary | ICD-10-CM | POA: Diagnosis not present

## 2017-09-12 DIAGNOSIS — I679 Cerebrovascular disease, unspecified: Secondary | ICD-10-CM | POA: Diagnosis not present

## 2017-09-12 DIAGNOSIS — N401 Enlarged prostate with lower urinary tract symptoms: Secondary | ICD-10-CM | POA: Diagnosis not present

## 2017-09-12 DIAGNOSIS — Z7401 Bed confinement status: Secondary | ICD-10-CM | POA: Diagnosis not present

## 2017-09-12 LAB — GLUCOSE, CAPILLARY
Glucose-Capillary: 113 mg/dL — ABNORMAL HIGH (ref 65–99)
Glucose-Capillary: 94 mg/dL (ref 65–99)

## 2017-09-12 MED ORDER — LISINOPRIL 5 MG PO TABS: 5.0000 mg | ORAL_TABLET | Freq: Every day | ORAL | 0 refills | Status: DC

## 2017-09-12 MED ORDER — POLYETHYLENE GLYCOL 3350 17 G PO PACK
17.0000 g | PACK | Freq: Every day | ORAL | Status: DC
  Administered 2017-09-12: 17 g via ORAL
  Filled 2017-09-12: qty 1

## 2017-09-12 MED ORDER — OXYCODONE HCL 5 MG PO TABS: 5.0000 mg | ORAL_TABLET | ORAL | 0 refills | Status: DC | PRN

## 2017-09-12 MED ORDER — ACETAMINOPHEN 325 MG PO TABS: 650.0000 mg | ORAL_TABLET | Freq: Four times a day (QID) | ORAL | Status: DC | PRN

## 2017-09-12 MED ORDER — BISACODYL 10 MG RE SUPP
10.0000 mg | Freq: Once | RECTAL | Status: AC
  Administered 2017-09-12: 10 mg via RECTAL
  Filled 2017-09-12: qty 1

## 2017-09-12 MED ORDER — FLEET ENEMA 7-19 GM/118ML RE ENEM: 1.0000 | ENEMA | Freq: Every day | RECTAL | Status: DC | PRN

## 2017-09-12 MED ORDER — POLYETHYLENE GLYCOL 3350 17 G PO PACK: 17.0000 g | PACK | Freq: Every day | ORAL | 0 refills | Status: DC

## 2017-09-12 MED ORDER — SENNOSIDES-DOCUSATE SODIUM 8.6-50 MG PO TABS: 1.0000 | ORAL_TABLET | Freq: Every evening | ORAL | 0 refills | Status: DC | PRN

## 2017-09-12 MED ORDER — CLOPIDOGREL BISULFATE 75 MG PO TABS: 75.0000 mg | ORAL_TABLET | Freq: Every day | ORAL | 0 refills | Status: DC

## 2017-09-12 MED ORDER — ATORVASTATIN CALCIUM 40 MG PO TABS: 40.0000 mg | ORAL_TABLET | Freq: Every day | ORAL | 0 refills | Status: DC

## 2017-09-12 NOTE — Progress Notes (Signed)
Report given to [employer] at [employer]. EMS called to transport patient.

## 2017-09-12 NOTE — Discharge Summary (Signed)
[facility] [place] NAME: [name]    MR#:  [id number]  DATE OF BIRTH:  [birth year]  DATE OF ADMISSION:  09/08/2017 ADMITTING PHYSICIAN: Arta Silence, MD  DATE OF DISCHARGE: 09/12/2017  PRIMARY CARE PHYSICIAN: [name], DO    ADMISSION DIAGNOSIS:  Near syncope [R55] Hypertensive emergency [I16.1] Nausea and vomiting, intractability of vomiting not specified, unspecified vomiting type [R11.2]  DISCHARGE DIAGNOSIS:  Active Problems:   Near syncope   Cerebellar stroke, acute ([place])   SECONDARY DIAGNOSIS:   Past Medical History:  Diagnosis Date  . Bowel obstruction ([place])   . BPH (benign prostatic hypertrophy)   . Gastric outlet obstruction   . High cholesterol   . Hyperchloremia   . Hypertension   . Pancreatitis     HOSPITAL COURSE:   1.  Acute left cerebellar stroke with left vertebral artery occlusion.  Continue aspirin, Plavix and atorvastatin.  The patient had left-sided weakness and speech abnormality.  Patient states that his left leg is better today.  Patient was seen by physical therapy and they recommended rehab. 2.  Left shoulder fracture secondary to fall here in the hospital.  Pain control.  Left arm sling.  Appreciate orthopedic consultation.  Follow-up with Dr. Mack Guise in 2 weeks.  Patient given clearance to use a walker with ambulation  3.  Hyperlipidemia unspecified.  LDL 144   On cholesterol panel on 08/31/2017.  Atorvastatin prescribed 4.  BPH on terazosin. 5.  Essential hypertension on lisinopril 6.  Fall.  Because of the fall and an fracture of the left shoulder  and new stroke with balance issues, physical therapy recommends rehab.  I could not get the patient to stand up today from the bed. 7.  Constipation.  Dulcolax suppository, Fleet enema as needed.  MiraLAX daily   DISCHARGE CONDITIONS:   Satisfactory  CONSULTS OBTAINED:  Treatment Team:  Arta Silence, MD Alexis Goodell, MD Schnier, Dolores Lory, MD Leotis Pain, MD [place], MD  DRUG ALLERGIES:  No Known Allergies  DISCHARGE MEDICATIONS:   Allergies as of 09/12/2017   No Known Allergies     Medication List    STOP taking these medications   simvastatin 20 MG tablet Commonly known as:  ZOCOR     TAKE these medications   acetaminophen 325 MG tablet Commonly known as:  TYLENOL Take 2 tablets (650 mg total) by mouth every 6 (six) hours as needed for mild pain (or Fever >/= 101).   Apoaequorin 20 MG Caps Commonly known as:  PREVAGEN EXTRA STRENGTH Take 20 mg by mouth daily.   aspirin EC 81 MG tablet Take 81 mg by mouth daily.   atorvastatin 40 MG tablet Commonly known as:  LIPITOR Take 1 tablet (40 mg total) by mouth daily at 6 PM.   clopidogrel 75 MG tablet Commonly known as:  PLAVIX Take 1 tablet (75 mg total) by mouth daily.   fluticasone 50 MCG/ACT nasal spray Commonly known as:  FLONASE Place 2 sprays into both nostrils daily.   lisinopril 5 MG tablet Commonly known as:  PRINIVIL,ZESTRIL Take 1 tablet (5 mg total) by mouth daily.   oxyCODONE 5 MG immediate release tablet Commonly known as:  Oxy IR/ROXICODONE Take 1 tablet (5 mg total) by mouth every 4 (four) hours as needed for moderate pain or severe pain.   polyethylene glycol packet Commonly known as:  MIRALAX / GLYCOLAX Take 17 g by mouth daily.   senna-docusate 8.6-50  MG tablet Commonly known as:  Senokot-S Take 1 tablet by mouth at bedtime as needed for mild constipation.   terazosin 10 MG capsule Commonly known as:  HYTRIN TAKE 1 CAPSULE AT BEDTIME        DISCHARGE INSTRUCTIONS:   Follow-up with Dr. at rehab 1 day Follow-up with orthopedic surgery Dr. Mack Guise in 2 weeks  If you experience worsening of your admission symptoms, develop shortness of breath, life threatening emergency, suicidal or homicidal thoughts you must seek medical attention immediately by calling 911 or calling  your MD immediately  if symptoms less severe.  You Must read complete instructions/literature along with all the possible adverse reactions/side effects for all the Medicines you take and that have been prescribed to you. Take any new Medicines after you have completely understood and accept all the possible adverse reactions/side effects.   Please note  You were cared for by a hospitalist during your hospital stay. If you have any questions about your discharge medications or the care you received while you were in the hospital after you are discharged, you can call the unit and asked to speak with the hospitalist on call if the hospitalist that took care of you is not available. Once you are discharged, your primary care physician will handle any further medical issues. Please note that NO REFILLS for any discharge medications will be authorized once you are discharged, as it is imperative that you return to your primary care physician (or establish a relationship with a primary care physician if you do not have one) for your aftercare needs so that they can reassess your need for medications and monitor your lab values.    Today   CHIEF COMPLAINT:   Chief Complaint  Patient presents with  . Emesis  . Weakness    HISTORY OF PRESENT ILLNESS:  [name]  is a 83 y.o. male came in with vomiting and weakness   VITAL SIGNS:  Blood pressure (!) 167/93, pulse 65, temperature 98.5 F (36.9 C), temperature source Oral, resp. rate 14, height 5\' 11"  (1.803 m), weight 123.4 kg (272 lb), SpO2 92 %.  PHYSICAL EXAMINATION:  GENERAL:  83 y.o.-year-old patient lying in the bed with no acute distress.  EYES: Pupils equal, round, reactive to light and accommodation. No scleral icterus. Extraocular muscles intact.  HEENT: Head atraumatic, normocephalic. Oropharynx and nasopharynx clear.  NECK:  Supple, no jugular venous distention. No thyroid enlargement, no tenderness.  LUNGS: Normal breath sounds  bilaterally, no wheezing, rales,rhonchi or crepitation. No use of accessory muscles of respiration.  CARDIOVASCULAR: S1, S2 normal. No murmurs, rubs, or gallops.  ABDOMEN: Soft, non-tender, non-distended. Bowel sounds present. No organomegaly or mass.  EXTREMITIES: No pedal edema, cyanosis, or clubbing.  NEUROLOGIC:  patient states his speech is still off.  I am able to understand him.  Other cranial nerves grossly intact.  I was unable to get him to stand up today.  Muscle strength 5/5 in all extremities. Sensation intact. Gait not checked.   PSYCHIATRIC: The patient is alert and oriented x 3.  SKIN: No obvious rash, lesion, or ulcer.   DATA REVIEW:   CBC Recent Labs  Lab 09/08/17 2120  WBC 7.0  HGB 14.9  HCT 42.8  PLT 183    Chemistries  Recent Labs  Lab 09/08/17 2120 09/09/17 1619  NA 136  --   K 4.2  --   CL 99*  --   CO2 30  --   GLUCOSE 134*  --  BUN 22*  --   CREATININE 1.01 1.00  CALCIUM 8.5*  --   AST 25  --   ALT 20  --   ALKPHOS 65  --   BILITOT 0.8  --     Cardiac Enzymes Recent Labs  Lab 09/09/17 0701  TROPONINI <0.03      RADIOLOGY:  Dg Ribs Unilateral Left  Result Date: 09/10/2017 CLINICAL DATA:  Left rib pain after fall last night. EXAM: LEFT RIBS - 2 VIEW COMPARISON:  None. FINDINGS: No fracture or other bone lesions are seen involving the ribs. IMPRESSION: Normal left ribs. Electronically Signed   By: Marijo Conception, M.D.   On: 09/10/2017 12:25   Ct Shoulder Left Wo Contrast  Result Date: 09/10/2017 CLINICAL DATA:  Left shoulder pain after a fall. EXAM: CT OF THE UPPER LEFT EXTREMITY WITHOUT CONTRAST TECHNIQUE: Multidetector CT imaging of the upper left extremity was performed according to the standard protocol. COMPARISON:  Radiographs dated 09/10/2017 FINDINGS: Bones/Joint/Cartilage There is a comminuted fracture of the lesser tuberosity of the proximal humerus. The fracture fragments include the attachment of the subscapularis tendon.  There is hemorrhage and edema in and around the subscapularis muscle. There is no dislocation. The remainder of the proximal humerus is intact. Scapula and distal clavicle are intact. Minimal AC joint arthropathy. Muscles and Tendons Hemorrhage in and around the subscapularis muscle and tendon. The fracture involves the insertion of the subscapularis on the lesser tuberosity. The other muscles of the rotator cuff appear intact. IMPRESSION: Comminuted slightly displaced fracture lesser tuberosity of the proximal humerus as described. This involves the insertion of the subscapularis tendon. Electronically Signed   By: Lorriane Shire M.D.   On: 09/10/2017 16:13     Management plans discussed with the patient, family and they are in agreement.  CODE STATUS:     Code Status Orders  (From admission, onward)        Start     Ordered   09/09/17 0309  Full code  Continuous     09/09/17 0308    Code Status History    Date Active Date Inactive Code Status Order ID Comments User Context   12/02/2014 1417 12/08/2014 1800 Full Code [id number]  Jeanie Cooks, MD ED   11/21/2014 1816 11/24/2014 1426 Full Code [id number]  Sherri Rad, MD Inpatient      TOTAL TIME TAKING CARE OF THIS PATIENT: 35 minutes.    Loletha Grayer M.D on 09/12/2017 at 8:47 AM  Between 7am to 6pm - Pager - [phone]  After 6pm go to [email or web address] - Proofreader  [employer] Office  [phone]  CC: Primary care physician; [name], DO

## 2017-09-12 NOTE — Plan of Care (Signed)
  Problem: Education: Goal: Knowledge of General Education information will improve Outcome: Adequate for Discharge   Problem: Health Behavior/Discharge Planning: Goal: Ability to manage health-related needs will improve Outcome: Adequate for Discharge   Problem: Clinical Measurements: Goal: Ability to maintain clinical measurements within normal limits will improve Outcome: Adequate for Discharge Goal: Will remain free from infection Outcome: Adequate for Discharge Goal: Diagnostic test results will improve Outcome: Adequate for Discharge Goal: Respiratory complications will improve Outcome: Adequate for Discharge Goal: Cardiovascular complication will be avoided Outcome: Adequate for Discharge   Problem: Activity: Goal: Risk for activity intolerance will decrease Outcome: Adequate for Discharge   Problem: Nutrition: Goal: Adequate nutrition will be maintained Outcome: Adequate for Discharge   Problem: Coping: Goal: Level of anxiety will decrease Outcome: Adequate for Discharge   Problem: Elimination: Goal: Will not experience complications related to bowel motility Outcome: Adequate for Discharge Goal: Will not experience complications related to urinary retention Outcome: Adequate for Discharge   Problem: Pain Managment: Goal: General experience of comfort will improve Outcome: Adequate for Discharge   Problem: Safety: Goal: Ability to remain free from injury will improve Outcome: Adequate for Discharge   Problem: Skin Integrity: Goal: Risk for impaired skin integrity will decrease Outcome: Adequate for Discharge   Problem: Education: Goal: Knowledge of disease or condition will improve Outcome: Adequate for Discharge Goal: Knowledge of secondary prevention will improve Outcome: Adequate for Discharge Goal: Knowledge of patient specific risk factors addressed and post discharge goals established will improve Outcome: Adequate for Discharge   Problem:  Coping: Goal: Will verbalize positive feelings about self Outcome: Adequate for Discharge Goal: Will identify appropriate support needs Outcome: Adequate for Discharge   Problem: Health Behavior/Discharge Planning: Goal: Ability to manage health-related needs will improve Outcome: Adequate for Discharge   Problem: Self-Care: Goal: Ability to participate in self-care as condition permits will improve Outcome: Adequate for Discharge Goal: Verbalization of feelings and concerns over difficulty with self-care will improve Outcome: Adequate for Discharge Goal: Ability to communicate needs accurately will improve Outcome: Adequate for Discharge   Problem: Nutrition: Goal: Risk of aspiration will decrease Outcome: Adequate for Discharge   Problem: Ischemic Stroke/TIA Tissue Perfusion: Goal: Complications of ischemic stroke/TIA will be minimized Outcome: Adequate for Discharge

## 2017-09-12 NOTE — Clinical Social Work Note (Signed)
CSW has contacted [facility] for room number and report number. CSW will update discharge information and deliver the discharge packet once [facility] has provided that information. CSW is following.  Santiago Bumpers, MSW, Latanya Presser [phone]

## 2017-09-12 NOTE — Clinical Social Work Note (Signed)
The patient will discharge to room 706 at [facility] today via non-emergent EMS. The patient's wife and good friend are aware and in agreement, and the facility has received all needed documentation. The CSW has delivered the discharge packet including any hard scripts. The CSW is signing off. Please consult should additional needs arise.  Santiago Bumpers, MSW, Latanya Presser [phone]

## 2017-09-12 NOTE — Progress Notes (Signed)
Patient transported to [facility] via EMS in stable condition. Family at bedside, will follow patient to facility.

## 2017-09-12 NOTE — Progress Notes (Addendum)
Discharge instructions given to patient and family at bedside. Prescriptions in discharge packet. Tele monitor off and both IV's taken out. Patient verbalized understanding. Awaiting for EMS. Will continue to monitor patient.

## 2017-09-14 DIAGNOSIS — E785 Hyperlipidemia, unspecified: Secondary | ICD-10-CM | POA: Diagnosis not present

## 2017-09-14 DIAGNOSIS — R350 Frequency of micturition: Secondary | ICD-10-CM | POA: Diagnosis not present

## 2017-09-14 DIAGNOSIS — I6350 Cerebral infarction due to unspecified occlusion or stenosis of unspecified cerebral artery: Secondary | ICD-10-CM | POA: Diagnosis not present

## 2017-09-14 DIAGNOSIS — I10 Essential (primary) hypertension: Secondary | ICD-10-CM | POA: Diagnosis not present

## 2017-09-14 DIAGNOSIS — K5900 Constipation, unspecified: Secondary | ICD-10-CM | POA: Diagnosis not present

## 2017-09-14 DIAGNOSIS — N401 Enlarged prostate with lower urinary tract symptoms: Secondary | ICD-10-CM | POA: Diagnosis not present

## 2017-09-18 ENCOUNTER — Emergency Department: Payer: Medicare Other

## 2017-09-18 ENCOUNTER — Encounter: Payer: Self-pay | Admitting: Emergency Medicine

## 2017-09-18 ENCOUNTER — Other Ambulatory Visit: Payer: Self-pay

## 2017-09-18 ENCOUNTER — Emergency Department
Admission: EM | Admit: 2017-09-18 | Discharge: 2017-09-18 | Disposition: A | Discharge status: Home / Self Care | Payer: Medicare Other | Attending: Emergency Medicine | Admitting: Emergency Medicine

## 2017-09-18 DIAGNOSIS — Z7982 Long term (current) use of aspirin: Secondary | ICD-10-CM | POA: Insufficient documentation

## 2017-09-18 DIAGNOSIS — S42302A Unspecified fracture of shaft of humerus, left arm, initial encounter for closed fracture: Secondary | ICD-10-CM | POA: Diagnosis not present

## 2017-09-18 DIAGNOSIS — R531 Weakness: Secondary | ICD-10-CM | POA: Insufficient documentation

## 2017-09-18 DIAGNOSIS — R110 Nausea: Secondary | ICD-10-CM | POA: Diagnosis not present

## 2017-09-18 DIAGNOSIS — Z7902 Long term (current) use of antithrombotics/antiplatelets: Secondary | ICD-10-CM | POA: Diagnosis not present

## 2017-09-18 DIAGNOSIS — Z79899 Other long term (current) drug therapy: Secondary | ICD-10-CM | POA: Insufficient documentation

## 2017-09-18 DIAGNOSIS — K5900 Constipation, unspecified: Secondary | ICD-10-CM | POA: Diagnosis not present

## 2017-09-18 DIAGNOSIS — I129 Hypertensive chronic kidney disease with stage 1 through stage 4 chronic kidney disease, or unspecified chronic kidney disease: Secondary | ICD-10-CM | POA: Diagnosis not present

## 2017-09-18 DIAGNOSIS — N183 Chronic kidney disease, stage 3 (moderate): Secondary | ICD-10-CM | POA: Insufficient documentation

## 2017-09-18 DIAGNOSIS — Z87891 Personal history of nicotine dependence: Secondary | ICD-10-CM | POA: Insufficient documentation

## 2017-09-18 LAB — CBC
HEMATOCRIT: 42.3 % (ref 40.0–52.0)
HEMOGLOBIN: 14.5 g/dL (ref 13.0–18.0)
MCH: 32.7 pg (ref 26.0–34.0)
MCHC: 34.2 g/dL (ref 32.0–36.0)
MCV: 95.6 fL (ref 80.0–100.0)
Platelets: 220 10*3/uL (ref 150–440)
RBC: 4.42 MIL/uL (ref 4.40–5.90)
RDW: 13.9 % (ref 11.5–14.5)
WBC: 8.1 10*3/uL (ref 3.8–10.6)

## 2017-09-18 LAB — COMPREHENSIVE METABOLIC PANEL
ALK PHOS: 75 U/L (ref 38–126)
ALT: 19 U/L (ref 17–63)
AST: 22 U/L (ref 15–41)
Albumin: 3.6 g/dL (ref 3.5–5.0)
Anion gap: 9 (ref 5–15)
BUN: 25 mg/dL — AB (ref 6–20)
CHLORIDE: 96 mmol/L — AB (ref 101–111)
CO2: 28 mmol/L (ref 22–32)
CREATININE: 1.14 mg/dL (ref 0.61–1.24)
Calcium: 8.6 mg/dL — ABNORMAL LOW (ref 8.9–10.3)
GFR calc Af Amer: >60 mL/min (ref >60)
GFR calc non Af Amer: 59 mL/min — ABNORMAL LOW (ref >60)
GLUCOSE: 128 mg/dL — AB (ref 65–99)
Potassium: 4.2 mmol/L (ref 3.5–5.1)
SODIUM: 133 mmol/L — AB (ref 135–145)
Total Bilirubin: 0.9 mg/dL (ref 0.3–1.2)
Total Protein: 7.5 g/dL (ref 6.5–8.1)

## 2017-09-18 LAB — URINALYSIS, COMPLETE (UACMP) WITH MICROSCOPIC
BACTERIA UA: NONE SEEN
Bilirubin Urine: NEGATIVE
Glucose, UA: 50 mg/dL — AB
Hgb urine dipstick: NEGATIVE
Ketones, ur: NEGATIVE mg/dL
Leukocytes, UA: NEGATIVE
Nitrite: NEGATIVE
PH: 6 (ref 5.0–8.0)
Protein, ur: NEGATIVE mg/dL
SPECIFIC GRAVITY, URINE: 1.015 (ref 1.005–1.030)
Squamous Epithelial / LPF: NONE SEEN (ref 0–5)

## 2017-09-18 LAB — TROPONIN I

## 2017-09-18 MED ORDER — ONDANSETRON HCL 4 MG/2ML IJ SOLN
4.0000 mg | Freq: Once | INTRAMUSCULAR | Status: AC
  Administered 2017-09-18: 4 mg via INTRAVENOUS

## 2017-09-18 MED ORDER — ONDANSETRON HCL 4 MG/2ML IJ SOLN
INTRAMUSCULAR | Status: AC
  Filled 2017-09-18: qty 2

## 2017-09-18 MED ORDER — SODIUM CHLORIDE 0.9 % IV BOLUS
500.0000 mL | Freq: Once | INTRAVENOUS | Status: AC
  Administered 2017-09-18: 500 mL via INTRAVENOUS

## 2017-09-18 NOTE — ED Provider Notes (Addendum)
[facility] Emergency Department Provider Note  ____________________________________________   I have reviewed the triage vital signs and the nursing notes. Where available I have reviewed prior notes and, if possible and indicated, outside hospital notes.    HISTORY  Chief Complaint Emesis and Weakness    HPI [name] is a 83 y.o. male who has a history of recent CVA with cerebellar infarcts, he also had a fall with a humeral head fracture and appears, presents today from [facility] where he is in rehab, he has had nausea since his stroke last month, and that nausea persist although is not regularly vomiting and he is eating.  He has still very unsteady gait, and he is concerned about that.  There really are not any new symptoms since his stroke and his discharge, but he is wondering why he is not much better and he is also concerned that he has not yet seen a doctor at [facility].  He also is not sure he like staying there.    Past Medical History:  Diagnosis Date  . Bowel obstruction ([place])   . BPH (benign prostatic hypertrophy)   . Gastric outlet obstruction   . High cholesterol   . Hyperchloremia   . Hypertension   . Pancreatitis     Patient Active Problem List   Diagnosis Date Noted  . Near syncope 09/09/2017  . Cerebellar stroke, acute ([place]) 09/09/2017  . CKD (chronic kidney disease), stage III ([place]) 09/01/2017  . Elevated PSA, between 10 and less than 20 ng/ml 07/22/2016  . Bilateral lower extremity edema 07/22/2016  . Abnormal glucose 07/15/2016  . BPH with obstruction/lower urinary tract symptoms 04/10/2016  . Hypertension 04/10/2016  . Memory loss 04/10/2016  . Gastric outlet obstruction 12/02/2014  . Generalized abdominal pain   . Benign fibroma of prostate 01/02/2011  . HLD (hyperlipidemia) 01/02/2011  . Obesity (BMI 30.0-34.9) 01/02/2011    Past Surgical History:  Procedure Laterality Date  . ESOPHAGOGASTRODUODENOSCOPY  N/A 12/03/2014   Procedure: ESOPHAGOGASTRODUODENOSCOPY (EGD);  Surgeon: Hulen Luster, MD;  Location[facility] ENDOSCOPY;  Service: Endoscopy;  Laterality: N/A;    Prior to Admission medications   Medication Sig Start Date End Date Taking? Authorizing Provider  acetaminophen (TYLENOL) 325 MG tablet Take 2 tablets (650 mg total) by mouth every 6 (six) hours as needed for mild pain (or Fever >/= 101). 09/12/17  Yes Wieting, Richard, MD  Apoaequorin [facility] EXTRA STRENGTH) 20 MG CAPS Take 20 mg by mouth daily. 04/10/16  Yes Karamalegos, Devonne Doughty, DO  aspirin EC 81 MG tablet Take 81 mg by mouth daily.   Yes [provider]  atorvastatin (LIPITOR) 40 MG tablet Take 1 tablet (40 mg total) by mouth daily at 6 PM. 09/12/17  Yes Wieting, Richard, MD  clopidogrel (PLAVIX) 75 MG tablet Take 1 tablet (75 mg total) by mouth daily. 09/12/17  Yes Wieting, Richard, MD  fluticasone (FLONASE) 50 MCG/ACT nasal spray Place 2 sprays into both nostrils daily. 07/25/16  Yes Melynda Ripple, MD  lisinopril (PRINIVIL,ZESTRIL) 5 MG tablet Take 1 tablet (5 mg total) by mouth daily. Patient taking differently: Take 20 mg by mouth daily.  09/12/17  Yes Wieting, Richard, MD  oxyCODONE (OXY IR/ROXICODONE) 5 MG immediate release tablet Take 1 tablet (5 mg total) by mouth every 4 (four) hours as needed for moderate pain or severe pain. 09/12/17  Yes Wieting, Richard, MD  polyethylene glycol (MIRALAX / GLYCOLAX) packet Take 17 g by mouth daily. 09/12/17  Yes Loletha Grayer, MD  promethazine (PHENERGAN) 12.5 MG tablet Take 12.5 mg by mouth every 6 (six) hours as needed for nausea or vomiting.   Yes [provider]  senna-docusate (SENOKOT-S) 8.6-50 MG tablet Take 1 tablet by mouth at bedtime as needed for mild constipation. 09/12/17  Yes Wieting, Richard, MD  terazosin (HYTRIN) 10 MG capsule TAKE 1 CAPSULE AT BEDTIME 01/05/17  Yes Karamalegos, Devonne Doughty, DO    Allergies Patient has no known allergies.  Family History   Problem Relation Age of Onset  . Heart disease Mother   . Heart disease Father   . Heart disease Brother     Social History Social History   Tobacco Use  . Smoking status: Former Research scientist (life sciences)  . Smokeless tobacco: Never Used  Substance Use Topics  . Alcohol use: No  . Drug use: No    Review of Systems Constitutional: No fever/chills Eyes: No visual changes. ENT: No sore throat. No stiff neck no neck pain Cardiovascular: Denies chest pain. Respiratory: Denies shortness of breath. Gastrointestinal:   no vomiting.  No diarrhea.  + constipation. Genitourinary: Negative for dysuria. Musculoskeletal: Negative lower extremity swelling Skin: Negative for rash. Neurological: Negative for severe headaches, focal weakness or numbness.   ____________________________________________   PHYSICAL EXAM:  VITAL SIGNS: ED Triage Vitals [09/18/17 1240]  Enc Vitals Group     BP (!) 100/57     Pulse Rate 73     Resp 18     Temp 98.3 F (36.8 C)     Temp Source Oral     SpO2 94 %     Weight 230 lb (104.3 kg)     Height 5\' 11"  (1.803 m)     Head Circumference      [facility] Flow      Pain Score 0     Pain Loc      Pain Edu?      Excl. in [place]?     Constitutional: Alert and oriented. Well appearing and in no acute distress. Eyes: Conjunctivae are normal Head: Atraumatic HEENT: No congestion/rhinnorhea. Mucous membranes are moist.  Oropharynx non-erythematous Neck:   Nontender with no meningismus, no masses, no stridor Cardiovascular: Normal rate, regular rhythm. Grossly normal heart sounds.  Good peripheral circulation. Respiratory: Normal respiratory effort.  No retractions. Lungs CTAB.  Somewhat diminished in the bases Abdominal: Soft and nontender. No distention. No guarding no rebound Back:  There is no focal tenderness or step off.  there is no midline tenderness there are no lesions noted. there is no CVA tenderness Musculoskeletal: No lower extremity tenderness, bruising noted to  the left upper extremity strong pulses, some degree of tenderness with ranging, this also limits ranging for neurologic exam good prescription strength no joint effusions, no DVT signs strong distal pulses no edema Neurologic:  Normal speech and language. No gross focal neurologic deficits are appreciated.  Skin:  Skin is warm, dry and intact. No rash noted. Psychiatric: Mood and affect are normal. Speech and behavior are normal.  ____________________________________________   LABS (all labs ordered are listed, but only abnormal results are displayed)  Labs Reviewed  COMPREHENSIVE METABOLIC PANEL - Abnormal; Notable for the following components:      Result Value   Sodium 133 (*)    Chloride 96 (*)    Glucose, Bld 128 (*)    BUN 25 (*)    Calcium 8.6 (*)    GFR calc non Af Amer 59 (*)    All other  components within normal limits  CBC  TROPONIN I  URINALYSIS, COMPLETE (UACMP) WITH MICROSCOPIC    Pertinent labs  results that were available during my care of the patient were reviewed by me and considered in my medical decision making (see chart for details). ____________________________________________  EKG  I personally interpreted any EKGs ordered by me or triage Sinus rhythm at 70 bpm no acute ST elevation or depression, LAD noted. ____________________________________________  RADIOLOGY  Pertinent labs & imaging results that were available during my care of the patient were reviewed by me and considered in my medical decision making (see chart for details). If possible, patient and/or family made aware of any abnormal findings.  No results found. ____________________________________________    PROCEDURES  Procedure(s) performed: None  Procedures  Critical Care performed: None  ____________________________________________   INITIAL IMPRESSION / ASSESSMENT AND PLAN / ED COURSE  Pertinent labs & imaging results that were available during my care of the patient were  reviewed by me and considered in my medical decision making (see chart for details).  Patient here because he is in somewhat dissatisfied with his nursing home/rehab facility and he is unhappy with the pace of his recovery from his stroke.  The really does not seem to be any acute complaints today, he and his wife are both generally disgruntled by the fact that he is not able to ambulate well yet and he still has nausea from his strokes.  He is on nausea medication.  He denies any chest pain shortness of breath, he only states "spits up" a little but is tolerating p.o. otherwise he states.  He has been slightly constipated and is little worried about that.  Overall however he does not seem to have any particular new neurologic or physical complaint.  Is makes it somewhat difficult to work-up I have personally reviewed his MRI, where he did have acute cerebellar infarcts, I have reviewed his blood work.  We will do basic blood work, obtain a chest x-ray to make sure he does not have any evidence of a pneumonia, I will do a urinalysis we will give him IV fluids and will reassess.  I have explained to him that is not likely on the weekend the emergency department will likely be able to find him new accommodation, and likely [facility] and have to work with them if he is unhappy with the place in general.  He denies being abused or otherwise harmed there.  Signed out to dr. Kerman Passey at the end of my shift pending ua. Anticipate d.c. ____________________________________________   FINAL CLINICAL IMPRESSION(S) / ED DIAGNOSES  Final diagnoses:  Weakness      This chart was dictated using voice recognition software.  Despite best efforts to proofread,  errors can occur which can change meaning.      Schuyler Amor, MD 09/18/17 1359    Schuyler Amor, MD 09/18/17 [phone]

## 2017-09-18 NOTE — ED Notes (Signed)
Per notes pt recommended rehab d/t pt being unable to ambulate after stroke and with should fx.

## 2017-09-18 NOTE — ED Provider Notes (Signed)
-----------------------------------------   5:25 PM on 09/18/2017 -----------------------------------------  Patient care assumed from Dr. Burlene Arnt.  Patient's work-up is largely at his baseline.  Urinalysis has returned normal.  Patient is currently at [facility], we will discharge back to this rehab facility.  I discussed this plan of care with the patient and his wife, they are agreeable.  We will need EMS transport.   Harvest Dark, MD 09/18/17 1725

## 2017-09-18 NOTE — ED Notes (Signed)
Per wife pt has been weak and nauseated since being discharged to [facility] after stroke. Pt  States he does not want to go back to [facility] and [facility].

## 2017-09-18 NOTE — ED Triage Notes (Addendum)
Per wife patient had stroke recently but cannot states when and was admitted here, went to [facility] for rehab at discharge. She is concerned because he seems generally weak for past week and vomiting x 1 week. Patient has sling on L arm which wife states is because he had a fall since going to nursing home and injured shoulder.

## 2017-09-18 NOTE — ED Notes (Signed)
Pt to be discharged back to [facility]. Ambulance will be needed.

## 2017-09-18 NOTE — ED Notes (Signed)
Called ride for the wife.

## 2017-09-18 NOTE — ED Notes (Signed)
Pt has urinal and given water to facilitate getting a urine sample.

## 2017-09-18 NOTE — Discharge Instructions (Signed)
We strongly advise that you follow closely with your primary care doctor in the next couple days, if you have persistent vomiting, new or different symptoms including weakness, difficulty breathing chest pain diarrhea or you feel worse in any way please return to the emergency department.

## 2017-09-21 ENCOUNTER — Ambulatory Visit (INDEPENDENT_AMBULATORY_CARE_PROVIDER_SITE_OTHER): Payer: Medicare Other | Admitting: Family Medicine

## 2017-09-21 ENCOUNTER — Encounter: Payer: Self-pay | Admitting: Family Medicine

## 2017-09-21 VITALS — BP 140/84 | HR 68 | Temp 97.8°F | Resp 16 | Ht 71.0 in

## 2017-09-21 DIAGNOSIS — R3915 Urgency of urination: Secondary | ICD-10-CM | POA: Diagnosis not present

## 2017-09-21 DIAGNOSIS — K5900 Constipation, unspecified: Secondary | ICD-10-CM | POA: Diagnosis not present

## 2017-09-21 DIAGNOSIS — S42292A Other displaced fracture of upper end of left humerus, initial encounter for closed fracture: Secondary | ICD-10-CM

## 2017-09-21 DIAGNOSIS — E785 Hyperlipidemia, unspecified: Secondary | ICD-10-CM | POA: Diagnosis not present

## 2017-09-21 DIAGNOSIS — I6930 Unspecified sequelae of cerebral infarction: Secondary | ICD-10-CM

## 2017-09-21 DIAGNOSIS — N183 Chronic kidney disease, stage 3 unspecified: Secondary | ICD-10-CM

## 2017-09-21 DIAGNOSIS — S42302D Unspecified fracture of shaft of humerus, left arm, subsequent encounter for fracture with routine healing: Secondary | ICD-10-CM | POA: Diagnosis not present

## 2017-09-21 DIAGNOSIS — I10 Essential (primary) hypertension: Secondary | ICD-10-CM | POA: Diagnosis not present

## 2017-09-21 DIAGNOSIS — N401 Enlarged prostate with lower urinary tract symptoms: Secondary | ICD-10-CM | POA: Diagnosis not present

## 2017-09-21 DIAGNOSIS — I6350 Cerebral infarction due to unspecified occlusion or stenosis of unspecified cerebral artery: Secondary | ICD-10-CM | POA: Diagnosis not present

## 2017-09-21 NOTE — Progress Notes (Signed)
Subjective:    Patient ID: [name], male    DOB: April 19, 1935, 83 y.o.   MRN: [id number]  [name] is a 83 y.o. male presenting on 09/21/2017 for Extremity Weakness  Accompanied by his wife, [name], who provides additional history.  HPI  HOSPITAL FOLLOW-UP VISIT  Hospital/Location: [facility] Date of Admission: 09/08/17 Date of Discharge: 09/12/17 Discharged to SNF - [facility] - admit 5/26 - current resident, up to 21 days Transitions of care telephone call: Not completed  Reason for Admission: near syncope Primary (+Secondary) Diagnosis: Acute L cerebellar stroke with residual L sided weakness, L shoulder fracture d/t fall in hospital, elevated BP HTN, CKD-III  - Hospital H&P and Discharge Summary have been reviewed - Patient presents today 10 days after discharge from recent hospitalization. Brief summary of recent course, patient had symptoms of near syncopal episode at home, later same day after our appointment in our office on 09/08/17, taken to hospital found to have stroke and persistent weakness and elevated BP. Reviewed hospital discharge H&P course and discharge summary, he had work-up confirmed CVA on MRI, neurology involved, switched from simvastatin to atorvastatin high dose, and plavix in addition to prior aspirin, discharged to SNF for rehab  - Today reports overall has had only gradual improvement. Still feels weak, overall he says he feels "better" than when in hospital. He has some pain in Left shoulder still with movement above shoulder, he has difficulty recalling details on the left shoulder, he was supposed to have ortho follow-up [facility] Dr Mack Guise within 2 weeks, his wife states orthopedics checked on him in SNF but he did not have any other X-ray  Additionally on labs he had some mild dehydration and elevated Creatinine back to baseline, consistent with CKD-III  Denies any new fall or injury, weakness, numbness, tingling, headache, vision change, chest pain or  dyspnea  I have reviewed the discharge medication list, and have reconciled the current and discharge medications today.   Current Outpatient Medications:  .  acetaminophen (TYLENOL) 325 MG tablet, Take 2 tablets (650 mg total) by mouth every 6 (six) hours as needed for mild pain (or Fever >/= 101)., Disp: , Rfl:  .  Apoaequorin (PREVAGEN EXTRA STRENGTH) 20 MG CAPS, Take 20 mg by mouth daily., Disp: 90 capsule, Rfl: 3 .  aspirin EC 81 MG tablet, Take 81 mg by mouth daily., Disp: , Rfl:  .  atorvastatin (LIPITOR) 40 MG tablet, Take 1 tablet (40 mg total) by mouth daily at 6 PM., Disp: 30 tablet, Rfl: 0 .  clopidogrel (PLAVIX) 75 MG tablet, Take 1 tablet (75 mg total) by mouth daily., Disp: 30 tablet, Rfl: 0 .  fluticasone (FLONASE) 50 MCG/ACT nasal spray, Place 2 sprays into both nostrils daily., Disp: 16 g, Rfl: 0 .  lisinopril (PRINIVIL,ZESTRIL) 5 MG tablet, Take 1 tablet (5 mg total) by mouth daily. (Patient taking differently: Take 20 mg by mouth daily. ), Disp: 30 tablet, Rfl: 0 .  oxyCODONE (OXY IR/ROXICODONE) 5 MG immediate release tablet, Take 1 tablet (5 mg total) by mouth every 4 (four) hours as needed for moderate pain or severe pain., Disp: 12 tablet, Rfl: 0 .  polyethylene glycol (MIRALAX / GLYCOLAX) packet, Take 17 g by mouth daily., Disp: 14 each, Rfl: 0 .  promethazine (PHENERGAN) 12.5 MG tablet, Take 12.5 mg by mouth every 6 (six) hours as needed for nausea or vomiting., Disp: , Rfl:  .  senna-docusate (SENOKOT-S) 8.6-50 MG tablet, Take 1 tablet by  mouth at bedtime as needed for mild constipation., Disp: 30 tablet, Rfl: 0 .  terazosin (HYTRIN) 10 MG capsule, TAKE 1 CAPSULE AT BEDTIME, Disp: 90 capsule, Rfl: 3  ------------------------------------------------------------------------- Social History   Tobacco Use  . Smoking status: Former Research scientist (life sciences)  . Smokeless tobacco: Never Used  Substance Use Topics  . Alcohol use: No  . Drug use: No    Review of Systems Per HPI unless  specifically indicated above     Objective:    BP 140/84   Pulse 68   Temp 97.8 F (36.6 C) (Oral)   Resp 16   Ht 5\' 11"  (1.803 m)   BMI 32.08 kg/m   Wt Readings from Last 3 Encounters:  09/18/17 230 lb (104.3 kg)  09/12/17 272 lb (123.4 kg)  09/08/17 233 lb (105.7 kg)    Physical Exam  Constitutional: He is oriented to person, place, and time. He appears well-developed and well-nourished. No distress.  Elderly 83 year old appearing, comfortable, cooperative, in wheelchair  HENT:  Head: Normocephalic and atraumatic.  Mouth/Throat: Oropharynx is clear and moist.  Eyes: Conjunctivae are normal. Right eye exhibits no discharge. Left eye exhibits no discharge.  Neck: Normal range of motion. Neck supple. No thyromegaly present.  Cardiovascular: Normal rate, regular rhythm, normal heart sounds and intact distal pulses.  No murmur heard. Pulmonary/Chest: Effort normal and breath sounds normal. No respiratory distress. He has no wheezes. He has no rales.  Musculoskeletal: He exhibits edema (non pitting edema+1 bilateral).  Lymphadenopathy:    He has no cervical adenopathy.  Neurological: He is alert and oriented to person, place, and time. No cranial nerve deficit or sensory deficit. He exhibits normal muscle tone.  Speech is normal  Wheelchair bound, generalized weakness still evident on exam Left sided weakness both upper and lower extremity, unable to lift left arm unassisted above shoulder level, has some pain, also with left lower extremity some weakness flex/ext muscle str 4/5 compared to R side of both   Skin: Skin is warm and dry. No rash noted. He is not diaphoretic. No erythema.  Ecchymosis left upper extremity under side  Psychiatric: He has a normal mood and affect. His behavior is normal.  Well groomed, good eye contact, normal speech and thoughts  Nursing note and vitals reviewed.  I have personally reviewed the radiology report from MRI Brain MRA 09/09/17 from  hospital  CLINICAL DATA:  Presyncope in shower. History of hypertension, hypercholesterolemia.  EXAM: MRI HEAD WITHOUT CONTRAST  MRA HEAD WITHOUT CONTRAST  TECHNIQUE: Multiplanar, multiecho pulse sequences of the brain and surrounding structures were obtained without intravenous contrast. Angiographic images of the head were obtained using MRA technique without contrast.  COMPARISON:  CT HEAD Sep 08, 2017  FINDINGS: MRI HEAD FINDINGS  INTRACRANIAL CONTENTS: 2 subcentimeter reduced diffusion LEFT cerebellum with low ADC values. Old bilateral cerebellar infarcts. Ventricles and sulci are normal for patient's age. Patchy supratentorial white matter FLAIR T2 hyperintensities compatible with mild chronic small vessel ischemic disease, less than expected for age. Old LEFT thalamus lacunar infarct. Prominent basal ganglia perivascular spaces associated with chronic small vessel ischemic changes. No midline shift, mass effect or masses. No abnormal extra-axial fluid collections.  VASCULAR: Normal major intracranial vascular flow voids present at skull base.  SKULL AND UPPER CERVICAL SPINE: No abnormal sellar expansion. No suspicious calvarial bone marrow signal. Old appearing base of dens fracture with mild canal stenosis.  SINUSES/ORBITS: Small mastoid effusions in trace paranasal sinus mucosal thickening.The included ocular globes  and orbital contents are non-suspicious. Status post bilateral ocular lens implants.  OTHER: 1 cm cyst RIGHT fossa of Rosenmller.  MRA HEAD FINDINGS  ANTERIOR CIRCULATION: Normal flow related enhancement of the included cervical, petrous, cavernous and supraclinoid internal carotid arteries. Patent anterior communicating artery. Patent anterior and middle cerebral arteries, including distal segments.  No large vessel occlusion, flow limiting stenosis, aneurysm.  POSTERIOR CIRCULATION: LEFT vertebral artery is dominant.  Basilar artery is patent, with normal flow related enhancement of the main branch vessels. Patent posterior cerebral arteries. Severe stenosis RIGHT P2 origin associated with tortuous segment resulting in flow artifact.  No large vessel occlusion, aneurysm.  ANATOMIC VARIANTS: None.  Source images and MIP images were reviewed.  IMPRESSION: MRI HEAD:  1. Two acute subcentimeter LEFT cerebellar nonhemorrhagic infarcts. 2. Old small cerebellar and LEFT thalamus infarcts. Mild chronic small vessel ischemic changes. 3. Old appearing base of dens fracture would be better characterized on NONEMERGENT CT cervical spine.  MRA HEAD:  1. No emergent large vessel occlusion. 2. Severe stenosis RIGHT P2 segment may be overestimated by flow artifact.   Electronically Signed   By: Elon Alas M.D.   On: 09/09/2017 02:58  ----------- I have personally reviewed the radiology report from 09/10/17 on L shoulder X-ray.  CLINICAL DATA:  Recent fall with left shoulder pain, initial encounter  EXAM: LEFT SHOULDER - 2+ VIEW  COMPARISON:  None.  FINDINGS: Humeral head is well seated. Multiple small bony fragments are identified adjacent to the medial aspect of the humeral head although a complete fracture is not identified. This may be related to some focal impaction. The need for further evaluation can be determined on a clinical basis.  IMPRESSION: Multiple small fracture fragments adjacent to the humeral head likely related to some impaction injury. No complete transverse fracture is identified. Cross-sectional imaging may be helpful as indicated.   Electronically Signed   By: Inez Catalina M.D.   On: 09/10/2017 08:06  Results for orders placed or performed during the hospital encounter of 09/18/17  CBC  Result Value Ref Range   WBC 8.1 3.8 - 10.6 K/uL   RBC 4.42 4.40 - 5.90 MIL/uL   Hemoglobin 14.5 13.0 - 18.0 g/dL   HCT 42.3 40.0 - 52.0 %   MCV 95.6  80.0 - 100.0 fL   MCH 32.7 26.0 - 34.0 pg   MCHC 34.2 32.0 - 36.0 g/dL   RDW 13.9 11.5 - 14.5 %   Platelets 220 150 - 440 K/uL  Urinalysis, Complete w Microscopic  Result Value Ref Range   Color, Urine YELLOW (A) YELLOW   APPearance CLEAR (A) CLEAR   Specific Gravity, Urine 1.015 1.005 - 1.030   pH 6.0 5.0 - 8.0   Glucose, UA 50 (A) NEGATIVE mg/dL   Hgb urine dipstick NEGATIVE NEGATIVE   Bilirubin Urine NEGATIVE NEGATIVE   Ketones, ur NEGATIVE NEGATIVE mg/dL   Protein, ur NEGATIVE NEGATIVE mg/dL   Nitrite NEGATIVE NEGATIVE   Leukocytes, UA NEGATIVE NEGATIVE   RBC / HPF 0-5 0 - 5 RBC/hpf   WBC, UA 0-5 0 - 5 WBC/hpf   Bacteria, UA NONE SEEN NONE SEEN   Squamous Epithelial / LPF NONE SEEN 0 - 5  Troponin I  Result Value Ref Range   Troponin I <0.03 <0.03 ng/mL  Comprehensive metabolic panel  Result Value Ref Range   Sodium 133 (L) 135 - 145 mmol/L   Potassium 4.2 3.5 - 5.1 mmol/L   Chloride 96 (L) 101 -  111 mmol/L   CO2 28 22 - 32 mmol/L   Glucose, Bld 128 (H) 65 - 99 mg/dL   BUN 25 (H) 6 - 20 mg/dL   Creatinine, Ser 1.14 0.61 - 1.24 mg/dL   Calcium 8.6 (L) 8.9 - 10.3 mg/dL   Total Protein 7.5 6.5 - 8.1 g/dL   Albumin 3.6 3.5 - 5.0 g/dL   AST 22 15 - 41 U/L   ALT 19 17 - 63 U/L   Alkaline Phosphatase 75 38 - 126 U/L   Total Bilirubin 0.9 0.3 - 1.2 mg/dL   GFR calc non Af Amer 59 (L) >60 mL/min   GFR calc Af Amer >60 >60 mL/min   Anion gap 9 5 - 15      Assessment & Plan:   Problem List Items Addressed This Visit    CKD (chronic kidney disease), stage III (HCC)    Stable CKD-III, reviewed Creatinine labs recently Seems near baseline now, after discharge      History of cerebrovascular accident (CVA) with residual deficit - Primary    Recent acute CVA, with residual L sided weakness Currently at SNF rehab with PT, gradual improvement On DAPT now with ASA plavix, and higher dose statin changed in hospital Agree with current plan, needs continued PT - then will need  to arrange [facility] PT on discharge, advised them to check with case manager/social worker at [facility] to help arrange this      Humeral head fracture, left, closed, initial encounter    Still persistent, some mild improvement X-ray reviewed Most likely does not need surgical intervention from chart review Has reduced range of motion active flexion, some may be with L sided weakness, difficult to tell Followed by [facility]o Dr Mack Guise, given patient contact info for them and advised to follow-up in interval as advised by hospital discharge          No orders of the defined types were placed in this encounter.   Follow up plan: Return in about 6 weeks (around 11/02/2017) for Follow-up CVA, SNF rehab.  Signed plan of care consult note given to me today by patient - of note there was no employee from SNF rehab today, patient was unclear on why he was here at office.  Nobie Putnam, [facility] 09/22/2017, 10:08 AM

## 2017-09-21 NOTE — Patient Instructions (Addendum)
Thank you for coming to the office today.  Overall I do not recommend any changes to current medicines at this time.  Keep working on therapy with PT  Recommend that you contact Dr Mack Guise to follow-up Left shoulder to see if any other treatment is needed  Follow-up as planned with Urologist  Recommend that you discuss with Case Manager or Social Worker at [facility] and they can help arrange [place] in future  ?[facility] [address] [place], [place] [address]  Phone: [phone] [email or web address]  Please schedule a Follow-up Appointment to: Return in about 6 weeks (around 11/02/2017) for Follow-up CVA, SNF rehab.  If you have any other questions or concerns, please feel free to call the office or send a message through [facility]. You may also schedule an earlier appointment if necessary.  Additionally, you may be receiving a survey about your experience at our office within a few days to 1 week by e-mail or mail. We value your feedback.  Nobie Putnam, DO [place] [facility]

## 2017-09-22 DIAGNOSIS — S42292A Other displaced fracture of upper end of left humerus, initial encounter for closed fracture: Secondary | ICD-10-CM | POA: Insufficient documentation

## 2017-09-22 NOTE — Assessment & Plan Note (Signed)
Still persistent, some mild improvement X-ray reviewed Most likely does not need surgical intervention from chart review Has reduced range of motion active flexion, some may be with L sided weakness, difficult to tell Followed by [facility] Dr Mack Guise, given patient contact info for them and advised to follow-up in interval as advised by hospital discharge

## 2017-09-22 NOTE — Assessment & Plan Note (Signed)
Recent acute CVA, with residual L sided weakness Currently at SNF rehab with PT, gradual improvement On DAPT now with ASA plavix, and higher dose statin changed in hospital Agree with current plan, needs continued PT - then will need to arrange [facility] PT on discharge, advised them to check with case manager/social worker at [facility] to help arrange this

## 2017-09-22 NOTE — Assessment & Plan Note (Signed)
Stable CKD-III, reviewed Creatinine labs recently Seems near baseline now, after discharge

## 2017-09-30 DIAGNOSIS — N401 Enlarged prostate with lower urinary tract symptoms: Secondary | ICD-10-CM | POA: Diagnosis not present

## 2017-09-30 DIAGNOSIS — I6350 Cerebral infarction due to unspecified occlusion or stenosis of unspecified cerebral artery: Secondary | ICD-10-CM | POA: Diagnosis not present

## 2017-09-30 DIAGNOSIS — I10 Essential (primary) hypertension: Secondary | ICD-10-CM | POA: Diagnosis not present

## 2017-09-30 DIAGNOSIS — E785 Hyperlipidemia, unspecified: Secondary | ICD-10-CM | POA: Diagnosis not present

## 2017-09-30 DIAGNOSIS — K5900 Constipation, unspecified: Secondary | ICD-10-CM | POA: Diagnosis not present

## 2017-10-04 ENCOUNTER — Other Ambulatory Visit: Payer: Medicare Other

## 2017-10-04 DIAGNOSIS — R972 Elevated prostate specific antigen [PSA]: Secondary | ICD-10-CM

## 2017-10-05 ENCOUNTER — Telehealth: Payer: Self-pay | Admitting: Family Medicine

## 2017-10-05 DIAGNOSIS — K5900 Constipation, unspecified: Secondary | ICD-10-CM | POA: Diagnosis not present

## 2017-10-05 DIAGNOSIS — Z9181 History of falling: Secondary | ICD-10-CM | POA: Diagnosis not present

## 2017-10-05 DIAGNOSIS — Z79899 Other long term (current) drug therapy: Secondary | ICD-10-CM | POA: Diagnosis not present

## 2017-10-05 DIAGNOSIS — I10 Essential (primary) hypertension: Secondary | ICD-10-CM | POA: Diagnosis not present

## 2017-10-05 DIAGNOSIS — S4292XD Fracture of left shoulder girdle, part unspecified, subsequent encounter for fracture with routine healing: Secondary | ICD-10-CM | POA: Diagnosis not present

## 2017-10-05 DIAGNOSIS — E785 Hyperlipidemia, unspecified: Secondary | ICD-10-CM | POA: Diagnosis not present

## 2017-10-05 DIAGNOSIS — Z7902 Long term (current) use of antithrombotics/antiplatelets: Secondary | ICD-10-CM | POA: Diagnosis not present

## 2017-10-05 DIAGNOSIS — N400 Enlarged prostate without lower urinary tract symptoms: Secondary | ICD-10-CM | POA: Diagnosis not present

## 2017-10-05 DIAGNOSIS — Z7982 Long term (current) use of aspirin: Secondary | ICD-10-CM | POA: Diagnosis not present

## 2017-10-05 DIAGNOSIS — I69328 Other speech and language deficits following cerebral infarction: Secondary | ICD-10-CM | POA: Diagnosis not present

## 2017-10-05 DIAGNOSIS — Z87891 Personal history of nicotine dependence: Secondary | ICD-10-CM | POA: Diagnosis not present

## 2017-10-05 DIAGNOSIS — I69354 Hemiplegia and hemiparesis following cerebral infarction affecting left non-dominant side: Secondary | ICD-10-CM | POA: Diagnosis not present

## 2017-10-05 LAB — PSA: PROSTATE SPECIFIC AG, SERUM: 17.9 ng/mL — AB (ref 0.0–4.0)

## 2017-10-05 NOTE — Telephone Encounter (Signed)
[name] with [facility] needs a verbal for nursing visit twice a week for 2 weeks then once every other week for seven weeks.  Pt refused a bath aid.  She would also like to have social worker come out.  She is faxing a med list.  Her call back number is [phone]

## 2017-10-06 NOTE — Telephone Encounter (Signed)
Called back, spoke with Judeen Hammans, confirmed verbal orders. And also answered question regarding med rec, he should be on ASA 81 and Plavix 75, for Dual Anti Platelet therapy after hospitalization CVA, see my note from 09/21/17. Also he may take Prevagen if helping.  Nobie Putnam, [facility] 10/06/2017, 10:21 AM

## 2017-10-07 ENCOUNTER — Telehealth: Payer: Self-pay | Admitting: Family Medicine

## 2017-10-07 DIAGNOSIS — E785 Hyperlipidemia, unspecified: Secondary | ICD-10-CM | POA: Diagnosis not present

## 2017-10-07 DIAGNOSIS — I69354 Hemiplegia and hemiparesis following cerebral infarction affecting left non-dominant side: Secondary | ICD-10-CM | POA: Diagnosis not present

## 2017-10-07 DIAGNOSIS — I69328 Other speech and language deficits following cerebral infarction: Secondary | ICD-10-CM | POA: Diagnosis not present

## 2017-10-07 DIAGNOSIS — I10 Essential (primary) hypertension: Secondary | ICD-10-CM | POA: Diagnosis not present

## 2017-10-07 DIAGNOSIS — S4292XD Fracture of left shoulder girdle, part unspecified, subsequent encounter for fracture with routine healing: Secondary | ICD-10-CM | POA: Diagnosis not present

## 2017-10-07 DIAGNOSIS — Z9181 History of falling: Secondary | ICD-10-CM | POA: Diagnosis not present

## 2017-10-07 NOTE — Telephone Encounter (Signed)
[name] with [name] need a order for PT once a week for 1 week, 3 times a week for 2 weeks and 2 times for two weeks,  Her call back number is [phone]

## 2017-10-08 ENCOUNTER — Ambulatory Visit: Payer: Medicare Other

## 2017-10-08 DIAGNOSIS — I10 Essential (primary) hypertension: Secondary | ICD-10-CM | POA: Diagnosis not present

## 2017-10-08 DIAGNOSIS — E785 Hyperlipidemia, unspecified: Secondary | ICD-10-CM | POA: Diagnosis not present

## 2017-10-08 DIAGNOSIS — Z9181 History of falling: Secondary | ICD-10-CM | POA: Diagnosis not present

## 2017-10-08 DIAGNOSIS — S4292XD Fracture of left shoulder girdle, part unspecified, subsequent encounter for fracture with routine healing: Secondary | ICD-10-CM | POA: Diagnosis not present

## 2017-10-08 DIAGNOSIS — I69354 Hemiplegia and hemiparesis following cerebral infarction affecting left non-dominant side: Secondary | ICD-10-CM | POA: Diagnosis not present

## 2017-10-08 DIAGNOSIS — I69328 Other speech and language deficits following cerebral infarction: Secondary | ICD-10-CM | POA: Diagnosis not present

## 2017-10-08 NOTE — Telephone Encounter (Signed)
Verbal was given by staff, to send form, we will sign off when it arrives to authorize  [name], [facility] 10/08/2017, 1:18 PM

## 2017-10-11 DIAGNOSIS — Z9181 History of falling: Secondary | ICD-10-CM | POA: Diagnosis not present

## 2017-10-11 DIAGNOSIS — I69354 Hemiplegia and hemiparesis following cerebral infarction affecting left non-dominant side: Secondary | ICD-10-CM | POA: Diagnosis not present

## 2017-10-11 DIAGNOSIS — E785 Hyperlipidemia, unspecified: Secondary | ICD-10-CM | POA: Diagnosis not present

## 2017-10-11 DIAGNOSIS — I10 Essential (primary) hypertension: Secondary | ICD-10-CM | POA: Diagnosis not present

## 2017-10-11 DIAGNOSIS — I69328 Other speech and language deficits following cerebral infarction: Secondary | ICD-10-CM | POA: Diagnosis not present

## 2017-10-11 DIAGNOSIS — S4292XD Fracture of left shoulder girdle, part unspecified, subsequent encounter for fracture with routine healing: Secondary | ICD-10-CM | POA: Diagnosis not present

## 2017-10-12 ENCOUNTER — Ambulatory Visit (INDEPENDENT_AMBULATORY_CARE_PROVIDER_SITE_OTHER): Payer: Medicare Other | Admitting: Family Medicine

## 2017-10-12 ENCOUNTER — Encounter: Payer: Self-pay | Admitting: Family Medicine

## 2017-10-12 VITALS — BP 114/55 | HR 70 | Temp 97.9°F | Ht 71.0 in | Wt 230.0 lb

## 2017-10-12 DIAGNOSIS — S4292XD Fracture of left shoulder girdle, part unspecified, subsequent encounter for fracture with routine healing: Secondary | ICD-10-CM | POA: Diagnosis not present

## 2017-10-12 DIAGNOSIS — E782 Mixed hyperlipidemia: Secondary | ICD-10-CM | POA: Diagnosis not present

## 2017-10-12 DIAGNOSIS — I69354 Hemiplegia and hemiparesis following cerebral infarction affecting left non-dominant side: Secondary | ICD-10-CM | POA: Diagnosis not present

## 2017-10-12 DIAGNOSIS — I10 Essential (primary) hypertension: Secondary | ICD-10-CM | POA: Diagnosis not present

## 2017-10-12 DIAGNOSIS — I6930 Unspecified sequelae of cerebral infarction: Secondary | ICD-10-CM

## 2017-10-12 DIAGNOSIS — E785 Hyperlipidemia, unspecified: Secondary | ICD-10-CM | POA: Diagnosis not present

## 2017-10-12 DIAGNOSIS — I69328 Other speech and language deficits following cerebral infarction: Secondary | ICD-10-CM | POA: Diagnosis not present

## 2017-10-12 DIAGNOSIS — Z9181 History of falling: Secondary | ICD-10-CM | POA: Diagnosis not present

## 2017-10-12 MED ORDER — LISINOPRIL 20 MG PO TABS: 20.0000 mg | ORAL_TABLET | Freq: Every day | ORAL | 3 refills | Status: DC

## 2017-10-12 MED ORDER — CLOPIDOGREL BISULFATE 75 MG PO TABS: 75.0000 mg | ORAL_TABLET | Freq: Every day | ORAL | 3 refills | Status: DC

## 2017-10-12 MED ORDER — ATORVASTATIN CALCIUM 40 MG PO TABS: 40.0000 mg | ORAL_TABLET | Freq: Every day | ORAL | 3 refills | Status: DC

## 2017-10-12 NOTE — Patient Instructions (Addendum)
Thank you for coming to the office today.  We will complete the VA Form requested for now - it may take few days - hopefully can finish by Thursday afternoon, we will call you when it is ready for pick up, you will need to complete the top half and fill your information out.  I may need to make a few calls to get this completed to discuss with nursing staff and family  For next visit with me - bring all medicines in 3 months  For now I refilled Lisinopril, Atorvastatin and Clopridgel (Plavix) to [facility] order [facility]   Please schedule a Follow-up Appointment to: Return in about 3 months (around 01/12/2018) for Follow-up CVA / MMSE / HTN / med review.  If you have any other questions or concerns, please feel free to call the office or send a message through [place]. You may also schedule an earlier appointment if necessary.  Additionally, you may be receiving a survey about your experience at our office within a few days to 1 week by e-mail or mail. We value your feedback.  Nobie Putnam, DO [place]

## 2017-10-12 NOTE — Progress Notes (Addendum)
Subjective:    Patient ID: [name], male    DOB: [birth year], 83 y.o.   MRN: [id number]  [name] is a 83 y.o. male presenting on 10/12/2017 for Cerebrovascular Accident (history of CVA recently, weakness, paperwork)  Patient is not accompanied by anyone else. He provides all history today.  HPI   Follow-up History of CVA with residual weakness Last visit 09/21/17, hospital follow-up after recent CVA/Stroke. See last note for background information on this course. He was discharged to SNF for rehab at [facility]. He was discharged approximately on 10/04/17 from SNF and transitioned to [facility] services through [facility], he is receiving currently [facility] PT and RN, and they are scheduled to provide services for few weeks.  - Today he reports that overall he has significantly improved within past 3 weeks since last office visit, and improving with his rehab - He has a form to complete from [place] for evaluation of current status and determine assistance/benefits in future - Regarding his physical limitations, he has improved residual weakness and limited motor restrictions, he now has significantly improved range of motion in both shoulders and no longer having significant upper body weakness. For lower extremities he has some residual weakness more in core strength but is still able to stand from seated and ambulate on his own without walker or cane for assistance, he will use walker in near future for more stability. He is not bed bound or wheelchair bound. He is limited to house mostly and is not able to leave safely without assistance. He gets a ride to doctors office, otherwise does not leave. - Regarding daily function, he is able to feed himself and do basic meal prep. He is no longer comfortable driving a vehicle. He is able to do most self care including dressing, toileting and bathing, mostly with sponge bathing has not resumed regular showers. He does not have significant  problem with vision, he is s/p cataract removal after many years. - Given history of CVA and he still has some difficulty with memory, he has problems with his medication management, in knowing which medications to take and when.   He continues on current medication regimen, without changes since last visit.   Current Outpatient Medications:  .  acetaminophen (TYLENOL) 325 MG tablet, Take 2 tablets (650 mg total) by mouth every 6 (six) hours as needed for mild pain (or Fever >/= 101)., Disp: , Rfl:  .  aspirin EC 81 MG tablet, Take 81 mg by mouth daily., Disp: , Rfl:  .  atorvastatin (LIPITOR) 40 MG tablet, Take 1 tablet (40 mg total) by mouth daily at 6 PM., Disp: 90 tablet, Rfl: 3 .  clopidogrel (PLAVIX) 75 MG tablet, Take 1 tablet (75 mg total) by mouth daily., Disp: 90 tablet, Rfl: 3 .  fluticasone (FLONASE) 50 MCG/ACT nasal spray, Place 2 sprays into both nostrils daily., Disp: 16 g, Rfl: 0 .  lisinopril (PRINIVIL,ZESTRIL) 20 MG tablet, Take 1 tablet (20 mg total) by mouth daily., Disp: 90 tablet, Rfl: 3 .  polyethylene glycol (MIRALAX / GLYCOLAX) packet, Take 17 g by mouth daily., Disp: 14 each, Rfl: 0 .  promethazine (PHENERGAN) 12.5 MG tablet, Take 12.5 mg by mouth every 6 (six) hours as needed for nausea or vomiting., Disp: , Rfl:  .  senna-docusate (SENOKOT-S) 8.6-50 MG tablet, Take 1 tablet by mouth at bedtime as needed for mild constipation., Disp: 30 tablet, Rfl: 0 .  Apoaequorin (PREVAGEN EXTRA  STRENGTH) 20 MG CAPS, Take 20 mg by mouth daily., Disp: 90 capsule, Rfl: 3 .  terazosin (HYTRIN) 10 MG capsule, Take 1 capsule (10 mg total) by mouth at bedtime., Disp: 90 capsule, Rfl: 3  Denies any injury fall, headache, syncope, lightheadedness, dizziness, new weakness numbness tingling  Depression screen [facility] 2/9 10/12/2017 09/21/2017 09/08/2017  Decreased Interest 0 0 0  Down, Depressed, Hopeless 0 0 0  PHQ - 2 Score 0 0 0    Social History   Tobacco Use  . Smoking status: Former  Research scientist (life sciences)  . Smokeless tobacco: Never Used  Substance Use Topics  . Alcohol use: No  . Drug use: No    Review of Systems Per HPI unless specifically indicated above     Objective:    BP (!) 114/55   Pulse 70   Temp 97.9 F (36.6 C) (Oral)   Ht 5\' 11"  (1.803 m)   Wt 230 lb (104.3 kg)   SpO2 96%   BMI 32.08 kg/m   Wt Readings from Last 3 Encounters:  10/12/17 230 lb (104.3 kg)  09/18/17 230 lb (104.3 kg)  09/12/17 272 lb (123.4 kg)    Physical Exam  Constitutional: He is oriented to person, place, and time. He appears well-developed and well-nourished. No distress.  Elderly 84 year old appearing, comfortable, cooperative, in wheelchair  HENT:  Head: Normocephalic and atraumatic.  Mouth/Throat: Oropharynx is clear and moist.  Eyes: Conjunctivae are normal. Right eye exhibits no discharge. Left eye exhibits no discharge.  Neck: Normal range of motion. Neck supple. No thyromegaly present.  Cardiovascular: Normal rate, regular rhythm, normal heart sounds and intact distal pulses.  No murmur heard. Pulmonary/Chest: Effort normal and breath sounds normal. No respiratory distress. He has no wheezes. He has no rales.  Musculoskeletal: Normal range of motion. He exhibits edema (stable to improved non pitting edema trace to +1 bilateral). He exhibits no tenderness.  Upper / Lower Extremities: - Normal muscle tone, strength bilateral upper extremities 5/5, lower extremities 5/5 - Bilateral shoulders, knees, wrist, ankles without deformity, tenderness, effusion  Significantly improved range of motion bilateral shoulders forward flex, abduction, internal rotation  Good range of motion lower extremities as well.  Able to stand from seated position out of wheelchair without cane or any assistance. Able to walk unassisted without any device several steps, mild difficulty with balance on first step but improved.  Lymphadenopathy:    He has no cervical adenopathy.  Neurological: He is  alert and oriented to person, place, and time. No cranial nerve deficit or sensory deficit. He exhibits normal muscle tone.  Speech is normal  Resolving or nearly resolved left sided residual weakness upper and lower extremity since last visit.  Skin: Skin is warm and dry. No rash noted. He is not diaphoretic. No erythema.  Psychiatric: He has a normal mood and affect. His behavior is normal.  Well groomed, good eye contact, normal speech and thoughts  Memory recall and short term remains poor  Nursing note and vitals reviewed.   Vision Screening (in office today) - performed by Theresia Majors CMA Without corrective lenses: - Right Eye 20/25 - Left Eye 20/20  6CIT Screen 10/12/2017 08/31/2017  What Year? 0 points 0 points  What month? 0 points 0 points  What time? 0 points 0 points  Count back from 20 0 points 0 points  Months in reverse 0 points 0 points  Repeat phrase 2 points 2 points  Total Score 2 2  Results for orders placed or performed in visit on 10/04/17  PSA  Result Value Ref Range   Prostate Specific Ag, Serum 17.9 (H) 0.0 - 4.0 ng/mL      Assessment & Plan:   Problem List Items Addressed This Visit    History of cerebrovascular accident (CVA) with residual deficit - Primary    Significantly improved general and left sided weakness s/p recent CVA Completed SNF rehab, [facility] 6CIT done and remains stable only difficulty with recall and short term memory, unchanged score from 5/14 to 6/25 after hospitalization CVA  Concern that he does require Home Health skilled services to assist with daily functions and improvement now, uncertain duration as long as he is progressing to goals. He was more functional prior to recent CVA. It seems as if he may improve his physical function, but may have some persistent memory loss and cognitive deficit that would impair his ability to travel outside house safely and also safely administer medications.  Now continue HH PT  through [facility] on DAPT with Aspirin and Plavix Continues on Statin - atorvastatin 40mg  - refilled  Follow-up as planned 3 months      Relevant Medications   clopidogrel (PLAVIX) 75 MG tablet   HLD (hyperlipidemia)    Continue on high intensity statin Refilled Atorvastatin 40mg  daily      Relevant Medications   atorvastatin (LIPITOR) 40 MG tablet   lisinopril (PRINIVIL,ZESTRIL) 20 MG tablet   Hypertension   Relevant Medications   atorvastatin (LIPITOR) 40 MG tablet   lisinopril (PRINIVIL,ZESTRIL) 20 MG tablet      Additionally functional assessment completed today and VA forms will be completed and returned to patient/family to have their personal information added and they may submit forms.  Meds ordered this encounter  Medications  . clopidogrel (PLAVIX) 75 MG tablet    Sig: Take 1 tablet (75 mg total) by mouth daily.    Dispense:  90 tablet    Refill:  3  . atorvastatin (LIPITOR) 40 MG tablet    Sig: Take 1 tablet (40 mg total) by mouth daily at 6 PM.    Dispense:  90 tablet    Refill:  3  . lisinopril (PRINIVIL,ZESTRIL) 20 MG tablet    Sig: Take 1 tablet (20 mg total) by mouth daily.    Dispense:  90 tablet    Refill:  3   No orders of the defined types were placed in this encounter.   Follow up plan: Return in about 3 months (around 01/12/2018) for Follow-up CVA / MMSE / HTN / med review.  Nobie Putnam, [facility] 10/12/2017, 2:17 PM

## 2017-10-13 ENCOUNTER — Inpatient Hospital Stay: Payer: Medicare Other | Admitting: Family Medicine

## 2017-10-13 ENCOUNTER — Other Ambulatory Visit: Payer: Self-pay | Admitting: Family Medicine

## 2017-10-13 DIAGNOSIS — R413 Other amnesia: Secondary | ICD-10-CM

## 2017-10-13 DIAGNOSIS — Z9181 History of falling: Secondary | ICD-10-CM | POA: Diagnosis not present

## 2017-10-13 DIAGNOSIS — I10 Essential (primary) hypertension: Secondary | ICD-10-CM | POA: Diagnosis not present

## 2017-10-13 DIAGNOSIS — E785 Hyperlipidemia, unspecified: Secondary | ICD-10-CM | POA: Diagnosis not present

## 2017-10-13 DIAGNOSIS — I69354 Hemiplegia and hemiparesis following cerebral infarction affecting left non-dominant side: Secondary | ICD-10-CM | POA: Diagnosis not present

## 2017-10-13 DIAGNOSIS — S4292XD Fracture of left shoulder girdle, part unspecified, subsequent encounter for fracture with routine healing: Secondary | ICD-10-CM | POA: Diagnosis not present

## 2017-10-13 DIAGNOSIS — N138 Other obstructive and reflux uropathy: Secondary | ICD-10-CM

## 2017-10-13 DIAGNOSIS — I69328 Other speech and language deficits following cerebral infarction: Secondary | ICD-10-CM | POA: Diagnosis not present

## 2017-10-13 DIAGNOSIS — N401 Enlarged prostate with lower urinary tract symptoms: Secondary | ICD-10-CM

## 2017-10-13 MED ORDER — APOAEQUORIN 20 MG PO CAPS: 20.0000 mg | ORAL_CAPSULE | Freq: Every day | ORAL | 3 refills | Status: DC

## 2017-10-13 MED ORDER — TERAZOSIN HCL 10 MG PO CAPS: 10.0000 mg | ORAL_CAPSULE | Freq: Every day | ORAL | 3 refills | Status: DC

## 2017-10-13 NOTE — Telephone Encounter (Signed)
Pt needs refill on terazosin and prevagen 10 mg sent to [facility].  Trisha's call back [phone]

## 2017-10-14 DIAGNOSIS — I69328 Other speech and language deficits following cerebral infarction: Secondary | ICD-10-CM | POA: Diagnosis not present

## 2017-10-14 DIAGNOSIS — I10 Essential (primary) hypertension: Secondary | ICD-10-CM | POA: Diagnosis not present

## 2017-10-14 DIAGNOSIS — I69354 Hemiplegia and hemiparesis following cerebral infarction affecting left non-dominant side: Secondary | ICD-10-CM | POA: Diagnosis not present

## 2017-10-14 DIAGNOSIS — E785 Hyperlipidemia, unspecified: Secondary | ICD-10-CM | POA: Diagnosis not present

## 2017-10-14 DIAGNOSIS — Z9181 History of falling: Secondary | ICD-10-CM | POA: Diagnosis not present

## 2017-10-14 DIAGNOSIS — S4292XD Fracture of left shoulder girdle, part unspecified, subsequent encounter for fracture with routine healing: Secondary | ICD-10-CM | POA: Diagnosis not present

## 2017-10-14 NOTE — Assessment & Plan Note (Addendum)
Significantly improved general and left sided weakness s/p recent CVA Completed SNF rehab, [facility] 6CIT done and remains stable only difficulty with recall and short term memory, unchanged score from 5/14 to 6/25 after hospitalization CVA  Concern that he does require Home Health skilled services to assist with daily functions and improvement now, uncertain duration as long as he is progressing to goals. He was more functional prior to recent CVA. It seems as if he may improve his physical function, but may have some persistent memory loss and cognitive deficit that would impair his ability to travel outside house safely and also safely administer medications.  Now continue HH PT through [facility] on DAPT with Aspirin and Plavix Continues on Statin - atorvastatin 40mg  - refilled  Follow-up as planned 3 months

## 2017-10-14 NOTE — Assessment & Plan Note (Addendum)
Continue on high intensity statin Refilled Atorvastatin 40mg  daily

## 2017-10-15 DIAGNOSIS — S4292XD Fracture of left shoulder girdle, part unspecified, subsequent encounter for fracture with routine healing: Secondary | ICD-10-CM | POA: Diagnosis not present

## 2017-10-15 DIAGNOSIS — I69354 Hemiplegia and hemiparesis following cerebral infarction affecting left non-dominant side: Secondary | ICD-10-CM | POA: Diagnosis not present

## 2017-10-15 DIAGNOSIS — I10 Essential (primary) hypertension: Secondary | ICD-10-CM | POA: Diagnosis not present

## 2017-10-15 DIAGNOSIS — Z9181 History of falling: Secondary | ICD-10-CM | POA: Diagnosis not present

## 2017-10-15 DIAGNOSIS — I69328 Other speech and language deficits following cerebral infarction: Secondary | ICD-10-CM | POA: Diagnosis not present

## 2017-10-15 DIAGNOSIS — E785 Hyperlipidemia, unspecified: Secondary | ICD-10-CM | POA: Diagnosis not present

## 2017-10-18 DIAGNOSIS — Z9181 History of falling: Secondary | ICD-10-CM | POA: Diagnosis not present

## 2017-10-18 DIAGNOSIS — E785 Hyperlipidemia, unspecified: Secondary | ICD-10-CM | POA: Diagnosis not present

## 2017-10-18 DIAGNOSIS — S4292XD Fracture of left shoulder girdle, part unspecified, subsequent encounter for fracture with routine healing: Secondary | ICD-10-CM | POA: Diagnosis not present

## 2017-10-18 DIAGNOSIS — I10 Essential (primary) hypertension: Secondary | ICD-10-CM | POA: Diagnosis not present

## 2017-10-18 DIAGNOSIS — I69328 Other speech and language deficits following cerebral infarction: Secondary | ICD-10-CM | POA: Diagnosis not present

## 2017-10-18 DIAGNOSIS — I69354 Hemiplegia and hemiparesis following cerebral infarction affecting left non-dominant side: Secondary | ICD-10-CM | POA: Diagnosis not present

## 2017-10-19 DIAGNOSIS — I69328 Other speech and language deficits following cerebral infarction: Secondary | ICD-10-CM | POA: Diagnosis not present

## 2017-10-19 DIAGNOSIS — I69354 Hemiplegia and hemiparesis following cerebral infarction affecting left non-dominant side: Secondary | ICD-10-CM | POA: Diagnosis not present

## 2017-10-19 DIAGNOSIS — E785 Hyperlipidemia, unspecified: Secondary | ICD-10-CM | POA: Diagnosis not present

## 2017-10-19 DIAGNOSIS — Z9181 History of falling: Secondary | ICD-10-CM | POA: Diagnosis not present

## 2017-10-19 DIAGNOSIS — S4292XD Fracture of left shoulder girdle, part unspecified, subsequent encounter for fracture with routine healing: Secondary | ICD-10-CM | POA: Diagnosis not present

## 2017-10-19 DIAGNOSIS — I10 Essential (primary) hypertension: Secondary | ICD-10-CM | POA: Diagnosis not present

## 2017-10-20 DIAGNOSIS — Z9181 History of falling: Secondary | ICD-10-CM | POA: Diagnosis not present

## 2017-10-20 DIAGNOSIS — S4292XD Fracture of left shoulder girdle, part unspecified, subsequent encounter for fracture with routine healing: Secondary | ICD-10-CM | POA: Diagnosis not present

## 2017-10-20 DIAGNOSIS — I10 Essential (primary) hypertension: Secondary | ICD-10-CM | POA: Diagnosis not present

## 2017-10-20 DIAGNOSIS — I69328 Other speech and language deficits following cerebral infarction: Secondary | ICD-10-CM | POA: Diagnosis not present

## 2017-10-20 DIAGNOSIS — I69354 Hemiplegia and hemiparesis following cerebral infarction affecting left non-dominant side: Secondary | ICD-10-CM | POA: Diagnosis not present

## 2017-10-20 DIAGNOSIS — E785 Hyperlipidemia, unspecified: Secondary | ICD-10-CM | POA: Diagnosis not present

## 2017-10-21 DIAGNOSIS — S4292XD Fracture of left shoulder girdle, part unspecified, subsequent encounter for fracture with routine healing: Secondary | ICD-10-CM | POA: Diagnosis not present

## 2017-10-21 DIAGNOSIS — E785 Hyperlipidemia, unspecified: Secondary | ICD-10-CM | POA: Diagnosis not present

## 2017-10-21 DIAGNOSIS — I69328 Other speech and language deficits following cerebral infarction: Secondary | ICD-10-CM | POA: Diagnosis not present

## 2017-10-21 DIAGNOSIS — Z9181 History of falling: Secondary | ICD-10-CM | POA: Diagnosis not present

## 2017-10-21 DIAGNOSIS — I69354 Hemiplegia and hemiparesis following cerebral infarction affecting left non-dominant side: Secondary | ICD-10-CM | POA: Diagnosis not present

## 2017-10-21 DIAGNOSIS — I10 Essential (primary) hypertension: Secondary | ICD-10-CM | POA: Diagnosis not present

## 2017-10-25 ENCOUNTER — Other Ambulatory Visit: Payer: Self-pay | Admitting: Family Medicine

## 2017-10-25 DIAGNOSIS — I10 Essential (primary) hypertension: Secondary | ICD-10-CM | POA: Diagnosis not present

## 2017-10-25 DIAGNOSIS — R413 Other amnesia: Secondary | ICD-10-CM

## 2017-10-25 DIAGNOSIS — E785 Hyperlipidemia, unspecified: Secondary | ICD-10-CM | POA: Diagnosis not present

## 2017-10-25 DIAGNOSIS — S4292XD Fracture of left shoulder girdle, part unspecified, subsequent encounter for fracture with routine healing: Secondary | ICD-10-CM | POA: Diagnosis not present

## 2017-10-25 DIAGNOSIS — I69354 Hemiplegia and hemiparesis following cerebral infarction affecting left non-dominant side: Secondary | ICD-10-CM | POA: Diagnosis not present

## 2017-10-25 DIAGNOSIS — Z9181 History of falling: Secondary | ICD-10-CM | POA: Diagnosis not present

## 2017-10-25 DIAGNOSIS — I69328 Other speech and language deficits following cerebral infarction: Secondary | ICD-10-CM | POA: Diagnosis not present

## 2017-10-25 MED ORDER — APOAEQUORIN 10 MG PO CAPS: 20.0000 mg | ORAL_CAPSULE | Freq: Every day | ORAL | 0 refills | Status: DC

## 2017-10-25 NOTE — Telephone Encounter (Signed)
[facility] called  [phone] wanting to know if Mr.Smick was  Still take  Prevagen 2 10 mg  If so wanted this months called into [address]. Other months called into  [facility]  Call back # is [phone]

## 2017-10-25 NOTE — Telephone Encounter (Signed)
Sent refill to local [facility] as requested for Prevagen 10mg  x 2 pills daily for 60 pills or 1 month. Already sent other rx to [facility].  Nobie Putnam, [facility] 10/25/2017, 6:09 PM

## 2017-10-26 DIAGNOSIS — Z9181 History of falling: Secondary | ICD-10-CM | POA: Diagnosis not present

## 2017-10-26 DIAGNOSIS — I69328 Other speech and language deficits following cerebral infarction: Secondary | ICD-10-CM | POA: Diagnosis not present

## 2017-10-26 DIAGNOSIS — I10 Essential (primary) hypertension: Secondary | ICD-10-CM | POA: Diagnosis not present

## 2017-10-26 DIAGNOSIS — S4292XD Fracture of left shoulder girdle, part unspecified, subsequent encounter for fracture with routine healing: Secondary | ICD-10-CM | POA: Diagnosis not present

## 2017-10-26 DIAGNOSIS — I69354 Hemiplegia and hemiparesis following cerebral infarction affecting left non-dominant side: Secondary | ICD-10-CM | POA: Diagnosis not present

## 2017-10-26 DIAGNOSIS — E785 Hyperlipidemia, unspecified: Secondary | ICD-10-CM | POA: Diagnosis not present

## 2017-10-27 DIAGNOSIS — I69328 Other speech and language deficits following cerebral infarction: Secondary | ICD-10-CM | POA: Diagnosis not present

## 2017-10-27 DIAGNOSIS — I69354 Hemiplegia and hemiparesis following cerebral infarction affecting left non-dominant side: Secondary | ICD-10-CM | POA: Diagnosis not present

## 2017-10-27 DIAGNOSIS — S4292XD Fracture of left shoulder girdle, part unspecified, subsequent encounter for fracture with routine healing: Secondary | ICD-10-CM | POA: Diagnosis not present

## 2017-10-27 DIAGNOSIS — Z9181 History of falling: Secondary | ICD-10-CM | POA: Diagnosis not present

## 2017-10-27 DIAGNOSIS — I10 Essential (primary) hypertension: Secondary | ICD-10-CM | POA: Diagnosis not present

## 2017-10-27 DIAGNOSIS — E785 Hyperlipidemia, unspecified: Secondary | ICD-10-CM | POA: Diagnosis not present

## 2017-10-28 ENCOUNTER — Telehealth: Payer: Self-pay

## 2017-10-28 DIAGNOSIS — I69354 Hemiplegia and hemiparesis following cerebral infarction affecting left non-dominant side: Secondary | ICD-10-CM | POA: Diagnosis not present

## 2017-10-28 DIAGNOSIS — I69328 Other speech and language deficits following cerebral infarction: Secondary | ICD-10-CM | POA: Diagnosis not present

## 2017-10-28 DIAGNOSIS — S4292XD Fracture of left shoulder girdle, part unspecified, subsequent encounter for fracture with routine healing: Secondary | ICD-10-CM | POA: Diagnosis not present

## 2017-10-28 DIAGNOSIS — Z9181 History of falling: Secondary | ICD-10-CM | POA: Diagnosis not present

## 2017-10-28 DIAGNOSIS — I10 Essential (primary) hypertension: Secondary | ICD-10-CM | POA: Diagnosis not present

## 2017-10-28 DIAGNOSIS — E785 Hyperlipidemia, unspecified: Secondary | ICD-10-CM | POA: Diagnosis not present

## 2017-10-28 NOTE — Telephone Encounter (Signed)
Agree with this request. Will provide my verbal authorization.  Can you notify Raquel Sarna w/ [facility] to proceed?  Nobie Putnam, [facility] 10/28/2017, 6:46 PM

## 2017-10-28 NOTE — Telephone Encounter (Signed)
[name] from [facility] called to request a extinction for Physical therapy for 2 weeks twice a week.

## 2017-10-28 NOTE — Telephone Encounter (Signed)
I spoke w/ [name] and notify her that the prescription for Prevagen was sent over to patient local pharmacy and [facility]. She verbalize understanding, no questions or concerns.

## 2017-10-29 NOTE — Telephone Encounter (Signed)
Verbal order given to [facility] for the pt to continue PT.

## 2017-11-01 DIAGNOSIS — Z9181 History of falling: Secondary | ICD-10-CM | POA: Diagnosis not present

## 2017-11-01 DIAGNOSIS — I69354 Hemiplegia and hemiparesis following cerebral infarction affecting left non-dominant side: Secondary | ICD-10-CM | POA: Diagnosis not present

## 2017-11-01 DIAGNOSIS — I69328 Other speech and language deficits following cerebral infarction: Secondary | ICD-10-CM | POA: Diagnosis not present

## 2017-11-01 DIAGNOSIS — S4292XD Fracture of left shoulder girdle, part unspecified, subsequent encounter for fracture with routine healing: Secondary | ICD-10-CM | POA: Diagnosis not present

## 2017-11-01 DIAGNOSIS — I10 Essential (primary) hypertension: Secondary | ICD-10-CM | POA: Diagnosis not present

## 2017-11-01 DIAGNOSIS — E785 Hyperlipidemia, unspecified: Secondary | ICD-10-CM | POA: Diagnosis not present

## 2017-11-01 NOTE — Progress Notes (Deleted)
11/02/2017 10:27 PM   [name] [birth year] [id number]  Referring provider: [name], DO [address] [place], [place] [address]  No chief complaint on file.   HPI: 83 year old male presents to urology today for further evaluation and management of an elevated PSA.  The patient was noted to have an elevated PSA as part of his routine screening. The patient has no significant family history of prostate cancer.  At his last appointment, it was being followed for stability in lieu of a biopsy.  PSA history:  9/12: 2.34 4/18: 11.8   6/18: 10.6  12/18: 11.1 06/19: 17.9  He is also on terazosin for BPH.  He has no complaints at this time.  He is unsure as to why he is following up today.     PMH: Past Medical History:  Diagnosis Date  . Bowel obstruction ([place])   . BPH (benign prostatic hypertrophy)   . Gastric outlet obstruction   . High cholesterol   . Hyperchloremia   . Hypertension   . Pancreatitis     Surgical History: Past Surgical History:  Procedure Laterality Date  . ESOPHAGOGASTRODUODENOSCOPY N/A 12/03/2014   Procedure: ESOPHAGOGASTRODUODENOSCOPY (EGD);  Surgeon: Hulen Luster, MD;  [facility] ENDOSCOPY;  Service: Endoscopy;  Laterality: N/A;    Home Medications:  Allergies as of 11/02/2017   No Known Allergies     Medication List        Accurate as of 11/01/17 10:27 PM. Always use your most recent med list.          acetaminophen 325 MG tablet Commonly known as:  TYLENOL Take 2 tablets (650 mg total) by mouth every 6 (six) hours as needed for mild pain (or Fever >/= 101).   Apoaequorin 20 MG Caps Commonly known as:  PREVAGEN EXTRA STRENGTH Take 20 mg by mouth daily.   Apoaequorin 10 MG Caps Commonly known as:  PREVAGEN Take 20 mg by mouth daily.   aspirin EC 81 MG tablet Take 81 mg by mouth daily.   atorvastatin 40 MG tablet Commonly known as:  LIPITOR Take 1 tablet (40 mg total) by mouth daily at 6 PM.   clopidogrel 75 MG  tablet Commonly known as:  PLAVIX Take 1 tablet (75 mg total) by mouth daily.   fluticasone 50 MCG/ACT nasal spray Commonly known as:  FLONASE Place 2 sprays into both nostrils daily.   lisinopril 20 MG tablet Commonly known as:  PRINIVIL,ZESTRIL Take 1 tablet (20 mg total) by mouth daily.   polyethylene glycol packet Commonly known as:  MIRALAX / GLYCOLAX Take 17 g by mouth daily.   promethazine 12.5 MG tablet Commonly known as:  PHENERGAN Take 12.5 mg by mouth every 6 (six) hours as needed for nausea or vomiting.   senna-docusate 8.6-50 MG tablet Commonly known as:  Senokot-S Take 1 tablet by mouth at bedtime as needed for mild constipation.   terazosin 10 MG capsule Commonly known as:  HYTRIN Take 1 capsule (10 mg total) by mouth at bedtime.       Allergies: No Known Allergies  Family History: Family History  Problem Relation Age of Onset  . Heart disease Mother   . Heart disease Father   . Heart disease Brother     Social History:  reports that he has quit smoking. He has never used smokeless tobacco. He reports that he does not drink alcohol or use drugs.  ROS:  Physical Exam: There were no vitals taken for this visit.  Constitutional:  Alert and oriented, No acute distress. HEENT: [place] AT, moist mucus membranes.  Trachea midline, no masses. Cardiovascular: No clubbing, cyanosis, or edema. Respiratory: Normal respiratory effort, no increased work of breathing. GI: Abdomen is soft, nontender, nondistended, no abdominal masses GU: No CVA tenderness.  Skin: No rashes, bruises or suspicious lesions. Lymph: No cervical or inguinal adenopathy. Neurologic: Grossly intact, no focal deficits, moving all 4 extremities. Psychiatric: Normal mood and affect. ***  Constitutional: Well nourished. Alert and oriented, No acute distress. HEENT: [name], moist mucus membranes. Trachea midline, no  masses. Cardiovascular: No clubbing, cyanosis, or edema. Respiratory: Normal respiratory effort, no increased work of breathing. GI: Abdomen is soft, non tender, non distended, no abdominal masses. Liver and spleen not palpable.  No hernias appreciated.  Stool sample for occult testing is not indicated.   GU: No CVA tenderness.  No bladder fullness or masses.  Patient with circumcised/uncircumcised phallus. ***Foreskin easily retracted***  Urethral meatus is patent.  No penile discharge. No penile lesions or rashes. Scrotum without lesions, cysts, rashes and/or edema.  Testicles are located scrotally bilaterally. No masses are appreciated in the testicles. Left and right epididymis are normal. Rectal: Patient with  normal sphincter tone. Anus and perineum without scarring or rashes. No rectal masses are appreciated. Prostate is approximately *** grams, *** nodules are appreciated. Seminal vesicles are normal. Skin: No rashes, bruises or suspicious lesions. Lymph: No cervical or inguinal adenopathy. Neurologic: Grossly intact, no focal deficits, moving all 4 extremities. Psychiatric: Normal mood and affect.   Laboratory Data: Lab Results  Component Value Date   WBC 8.1 09/18/2017   HGB 14.5 09/18/2017   HCT 42.3 09/18/2017   MCV 95.6 09/18/2017   PLT 220 09/18/2017    Lab Results  Component Value Date   CREATININE 1.14 09/18/2017    No results found for: PSA  No results found for: TESTOSTERONE  Lab Results  Component Value Date   HGBA1C 5.4 09/09/2017    Urinalysis    Component Value Date/Time   COLORURINE YELLOW (A) 09/18/2017 1255   APPEARANCEUR CLEAR (A) 09/18/2017 1255   APPEARANCEUR Clear 10/06/2016 1534   LABSPEC 1.015 09/18/2017 1255   LABSPEC >=1.030 07/18/2013 0931   PHURINE 6.0 09/18/2017 1255   GLUCOSEU 50 (A) 09/18/2017 1255   GLUCOSEU NEGATIVE 07/18/2013 0931   HGBUR NEGATIVE 09/18/2017 1255   BILIRUBINUR NEGATIVE 09/18/2017 1255   BILIRUBINUR Negative  10/06/2016 1534   BILIRUBINUR 1+ 07/18/2013 [place] 09/18/2017 [place] 09/18/2017 1255   NITRITE NEGATIVE 09/18/2017 1255   LEUKOCYTESUR NEGATIVE 09/18/2017 1255   LEUKOCYTESUR Negative 10/06/2016 1534   LEUKOCYTESUR NEGATIVE 07/18/2013 0931    Assessment & Plan:    1.  Elevated PSA Discussed the patient a PSA of approximately 10 is relatively normal for somewhere around 80.  I suggest that we ensure stability, so we will repeat his PSA today.  He will follow-up in 6 months for repeat PSA assuming this is stable. If it tends to be stable over the next year or so, we can probably stop PSA screening at that time.  2.  BPH Continue terazosin  No follow-ups on file.  [name], PA-C  [facility] [address] [place], [place] [address] [phone]

## 2017-11-02 ENCOUNTER — Ambulatory Visit: Payer: Medicare Other | Admitting: Urology

## 2017-11-03 DIAGNOSIS — S4292XD Fracture of left shoulder girdle, part unspecified, subsequent encounter for fracture with routine healing: Secondary | ICD-10-CM | POA: Diagnosis not present

## 2017-11-03 DIAGNOSIS — I69354 Hemiplegia and hemiparesis following cerebral infarction affecting left non-dominant side: Secondary | ICD-10-CM | POA: Diagnosis not present

## 2017-11-03 DIAGNOSIS — I10 Essential (primary) hypertension: Secondary | ICD-10-CM | POA: Diagnosis not present

## 2017-11-03 DIAGNOSIS — E785 Hyperlipidemia, unspecified: Secondary | ICD-10-CM | POA: Diagnosis not present

## 2017-11-03 DIAGNOSIS — I69328 Other speech and language deficits following cerebral infarction: Secondary | ICD-10-CM | POA: Diagnosis not present

## 2017-11-03 DIAGNOSIS — Z9181 History of falling: Secondary | ICD-10-CM | POA: Diagnosis not present

## 2017-11-04 ENCOUNTER — Encounter: Payer: Self-pay | Admitting: Urology

## 2017-11-04 DIAGNOSIS — I10 Essential (primary) hypertension: Secondary | ICD-10-CM | POA: Diagnosis not present

## 2017-11-04 DIAGNOSIS — S4292XD Fracture of left shoulder girdle, part unspecified, subsequent encounter for fracture with routine healing: Secondary | ICD-10-CM | POA: Diagnosis not present

## 2017-11-04 DIAGNOSIS — I69328 Other speech and language deficits following cerebral infarction: Secondary | ICD-10-CM | POA: Diagnosis not present

## 2017-11-04 DIAGNOSIS — I69354 Hemiplegia and hemiparesis following cerebral infarction affecting left non-dominant side: Secondary | ICD-10-CM | POA: Diagnosis not present

## 2017-11-04 DIAGNOSIS — Z9181 History of falling: Secondary | ICD-10-CM | POA: Diagnosis not present

## 2017-11-04 DIAGNOSIS — E785 Hyperlipidemia, unspecified: Secondary | ICD-10-CM | POA: Diagnosis not present

## 2017-11-08 DIAGNOSIS — I69328 Other speech and language deficits following cerebral infarction: Secondary | ICD-10-CM | POA: Diagnosis not present

## 2017-11-08 DIAGNOSIS — S4292XD Fracture of left shoulder girdle, part unspecified, subsequent encounter for fracture with routine healing: Secondary | ICD-10-CM | POA: Diagnosis not present

## 2017-11-08 DIAGNOSIS — E785 Hyperlipidemia, unspecified: Secondary | ICD-10-CM | POA: Diagnosis not present

## 2017-11-08 DIAGNOSIS — Z9181 History of falling: Secondary | ICD-10-CM | POA: Diagnosis not present

## 2017-11-08 DIAGNOSIS — I69354 Hemiplegia and hemiparesis following cerebral infarction affecting left non-dominant side: Secondary | ICD-10-CM | POA: Diagnosis not present

## 2017-11-08 DIAGNOSIS — I10 Essential (primary) hypertension: Secondary | ICD-10-CM | POA: Diagnosis not present

## 2017-11-10 DIAGNOSIS — E785 Hyperlipidemia, unspecified: Secondary | ICD-10-CM | POA: Diagnosis not present

## 2017-11-10 DIAGNOSIS — S4292XD Fracture of left shoulder girdle, part unspecified, subsequent encounter for fracture with routine healing: Secondary | ICD-10-CM | POA: Diagnosis not present

## 2017-11-10 DIAGNOSIS — I69354 Hemiplegia and hemiparesis following cerebral infarction affecting left non-dominant side: Secondary | ICD-10-CM | POA: Diagnosis not present

## 2017-11-10 DIAGNOSIS — I69328 Other speech and language deficits following cerebral infarction: Secondary | ICD-10-CM | POA: Diagnosis not present

## 2017-11-10 DIAGNOSIS — Z9181 History of falling: Secondary | ICD-10-CM | POA: Diagnosis not present

## 2017-11-10 DIAGNOSIS — I10 Essential (primary) hypertension: Secondary | ICD-10-CM | POA: Diagnosis not present

## 2017-11-10 DIAGNOSIS — M25512 Pain in left shoulder: Secondary | ICD-10-CM | POA: Diagnosis not present

## 2017-11-12 DIAGNOSIS — I69354 Hemiplegia and hemiparesis following cerebral infarction affecting left non-dominant side: Secondary | ICD-10-CM | POA: Diagnosis not present

## 2017-11-12 DIAGNOSIS — E785 Hyperlipidemia, unspecified: Secondary | ICD-10-CM | POA: Diagnosis not present

## 2017-11-12 DIAGNOSIS — I10 Essential (primary) hypertension: Secondary | ICD-10-CM | POA: Diagnosis not present

## 2017-11-12 DIAGNOSIS — Z9181 History of falling: Secondary | ICD-10-CM | POA: Diagnosis not present

## 2017-11-12 DIAGNOSIS — I69328 Other speech and language deficits following cerebral infarction: Secondary | ICD-10-CM | POA: Diagnosis not present

## 2017-11-12 DIAGNOSIS — S4292XD Fracture of left shoulder girdle, part unspecified, subsequent encounter for fracture with routine healing: Secondary | ICD-10-CM | POA: Diagnosis not present

## 2017-11-15 DIAGNOSIS — S4292XD Fracture of left shoulder girdle, part unspecified, subsequent encounter for fracture with routine healing: Secondary | ICD-10-CM | POA: Diagnosis not present

## 2017-11-15 DIAGNOSIS — E785 Hyperlipidemia, unspecified: Secondary | ICD-10-CM | POA: Diagnosis not present

## 2017-11-15 DIAGNOSIS — I69328 Other speech and language deficits following cerebral infarction: Secondary | ICD-10-CM | POA: Diagnosis not present

## 2017-11-15 DIAGNOSIS — Z9181 History of falling: Secondary | ICD-10-CM | POA: Diagnosis not present

## 2017-11-15 DIAGNOSIS — I10 Essential (primary) hypertension: Secondary | ICD-10-CM | POA: Diagnosis not present

## 2017-11-15 DIAGNOSIS — I69354 Hemiplegia and hemiparesis following cerebral infarction affecting left non-dominant side: Secondary | ICD-10-CM | POA: Diagnosis not present

## 2017-11-17 ENCOUNTER — Telehealth: Payer: Self-pay | Admitting: Family Medicine

## 2017-11-17 DIAGNOSIS — I6930 Unspecified sequelae of cerebral infarction: Secondary | ICD-10-CM

## 2017-11-17 DIAGNOSIS — Z9181 History of falling: Secondary | ICD-10-CM | POA: Diagnosis not present

## 2017-11-17 DIAGNOSIS — I69328 Other speech and language deficits following cerebral infarction: Secondary | ICD-10-CM | POA: Diagnosis not present

## 2017-11-17 DIAGNOSIS — I69354 Hemiplegia and hemiparesis following cerebral infarction affecting left non-dominant side: Secondary | ICD-10-CM | POA: Diagnosis not present

## 2017-11-17 DIAGNOSIS — S4292XD Fracture of left shoulder girdle, part unspecified, subsequent encounter for fracture with routine healing: Secondary | ICD-10-CM | POA: Diagnosis not present

## 2017-11-17 DIAGNOSIS — E785 Hyperlipidemia, unspecified: Secondary | ICD-10-CM | POA: Diagnosis not present

## 2017-11-17 DIAGNOSIS — I10 Essential (primary) hypertension: Secondary | ICD-10-CM | POA: Diagnosis not present

## 2017-11-17 NOTE — Telephone Encounter (Signed)
Referral to Outpatient [facility] PT was placed today 11/17/17.  Please notify Raquel Sarna with [facility] that this order was placed and then I would assume that [facility] would handle this referral.  Nobie Putnam, [facility] 11/17/2017, 1:01 PM

## 2017-11-17 NOTE — Telephone Encounter (Signed)
[name] with [name] said pt needs a referral to [name] because [name] is discharging pt from home health PT.  The fax number to out patient PT is [phone].  Please call [name] when referral has been done [phone]

## 2017-11-17 NOTE — Telephone Encounter (Signed)
[name] was notified that the order was placed.

## 2017-11-21 ENCOUNTER — Telehealth: Payer: Self-pay | Admitting: Urology

## 2017-11-21 NOTE — Telephone Encounter (Signed)
Please call [name] and let him know that his PSA has increased to 17.9.  This is concerning for prostate cancer.  He needs an office visit to discuss this further with any provider.

## 2017-11-22 NOTE — Telephone Encounter (Signed)
Attempted to reach pt, no answer, and vm not set up.

## 2017-11-23 NOTE — Telephone Encounter (Signed)
Attempted to reach patient, no answer, no vm. Sending unable to reach letter.

## 2017-11-30 DIAGNOSIS — I69354 Hemiplegia and hemiparesis following cerebral infarction affecting left non-dominant side: Secondary | ICD-10-CM | POA: Diagnosis not present

## 2017-11-30 DIAGNOSIS — S4292XD Fracture of left shoulder girdle, part unspecified, subsequent encounter for fracture with routine healing: Secondary | ICD-10-CM | POA: Diagnosis not present

## 2017-11-30 DIAGNOSIS — E785 Hyperlipidemia, unspecified: Secondary | ICD-10-CM | POA: Diagnosis not present

## 2017-11-30 DIAGNOSIS — I69328 Other speech and language deficits following cerebral infarction: Secondary | ICD-10-CM | POA: Diagnosis not present

## 2017-11-30 DIAGNOSIS — I10 Essential (primary) hypertension: Secondary | ICD-10-CM | POA: Diagnosis not present

## 2017-11-30 DIAGNOSIS — Z9181 History of falling: Secondary | ICD-10-CM | POA: Diagnosis not present

## 2017-12-07 ENCOUNTER — Ambulatory Visit: Payer: Medicare Other | Attending: Family Medicine

## 2017-12-07 DIAGNOSIS — R2689 Other abnormalities of gait and mobility: Secondary | ICD-10-CM

## 2017-12-07 NOTE — Therapy (Signed)
[place] MAIN [facility] SERVICES [address] [place], [place], [address] Phone: [phone]   Fax:  [phone]  Physical Therapy Evaluation  Patient Details  Name: [name] MRN: [id number] Date of Birth: 06/28/1934 No data recorded  Encounter Date: 12/07/2017  PT End of Session - 12/07/17 1540    Visit Number  1    Number of Visits  1    PT Start Time  1430    PT Stop Time  1528    PT Time Calculation (min)  58 min    Equipment Utilized During Treatment  Gait belt    Activity Tolerance  Patient tolerated treatment well    Behavior During Therapy  WFL for tasks assessed/performed       Past Medical History:  Diagnosis Date  . Bowel obstruction ([place])   . BPH (benign prostatic hypertrophy)   . Gastric outlet obstruction   . High cholesterol   . Hyperchloremia   . Hypertension   . Pancreatitis     Past Surgical History:  Procedure Laterality Date  . ESOPHAGOGASTRODUODENOSCOPY N/A 12/03/2014   Procedure: ESOPHAGOGASTRODUODENOSCOPY (EGD);  Surgeon: Hulen Luster, MD;  Location: [facility] ENDOSCOPY;  Service: Endoscopy;  Laterality: N/A;    There were no vitals filed for this visit.   Subjective Assessment - 12/07/17 1438    Subjective  Patient is a pleasant 83 year old male who presents for CVA balance with wife    Pertinent History  Patient is an 83 year old male discharged from [facility] on 09/16/17 for rehab after an acute CVA in left cerebellum with occlusion of the left vertebral artery. At [facility] he worked with PT and OT,  CVA occurred March 13th. walking with a walker and transferring with supervision. Patient had home health 2x/week after d/c but was discharged at end of month. While at [facility] patient had a fall resulting in humeral head fracture. Patient reports left leg is the problem, falls down to the left. Walks without AD. Patient and wife report feeling fine with current level of function and does not not need further PT    Limitations   Walking;House hold activities;Other (comment)    How long can you sit comfortably?  n/a    How long can you stand comfortably?  5 minutes until lean to left    How long can you walk comfortably?  no limit    Patient Stated Goals  walk normal,     Currently in Pain?  No/denies       PAIN: No pain reported POSTURE: Slight weight shift to R  PROM/AROM: WFL  STRENGTH:  Graded on a 0-5 scale Muscle Group Left Right  Hip Flex 4+/5 5/5  Hip Abd 4+/5 5/5  Hip Add 4+/5 5/5  Hip Ext 4-/5 5/5  Hip IR/ER 4+/5 5/5  Knee Flex 4+/5 5/5  Knee Ext 4+/5 5/5  Ankle DF 4+5 5/5  Ankle PF 4+/5 5/5   SENSATION: WFL  SPECIAL TESTS: Coordination: finger to nose test: coordinated Heel shin test: coordinated  FUNCTIONAL MOBILITY: STS independent  BALANCE: BERG: Good seated Good - standing: occasional sway when turning L.   GAIT: Prolonged ambulation results in external rotation of L foot with decreased clearance of foot. However gait retained reciprocating functional pattern with good speed and stability.   OUTCOME MEASURES: TEST Outcome Interpretation  5 times sit<>stand 8 sec >60 yo, >15 sec indicates increased risk for falls  10 meter walk test  8 sec=1.25          m/s <1.0 m/s indicates increased risk for falls; limited community ambulator  ABC 95%   6 minute walk test          1068     Feet 1000 feet is community Water quality scientist 49/56 <36/56 (100% risk for falls), 37-45 (80% risk for falls); 46-51 (>50% risk for falls); 52-55 (lower risk <25% of falls)  LEFS 74/80        Treat: Standing marches 10x with finger tip support Tandem stance: one foot in front of other with finger tip support 30 sec each leg  [facility] PT Assessment - 12/07/17 0001      Assessment   Medical Diagnosis  CVA    Onset Date/Surgical Date  06/30/17    Hand Dominance  Left    Next MD Visit  --   none planned   Prior Therapy  yes      Precautions   Precautions  Fall       Restrictions   Weight Bearing Restrictions  No      Balance Screen   Has the patient fallen in the past 6 months  Yes    How many times?  3-4    Has the patient had a decrease in activity level because of a fear of falling?   Yes    Is the patient reluctant to leave their home because of a fear of falling?   Yes      [place] residence    Living Arrangements  Spouse/significant other    Available Help at Discharge  Family    Type of [place] to enter    Entrance Stairs-Number of Steps  1    [place]  One level    [place] - standard;Cane - single point      Prior Function   Level of Independence  Independent with basic ADLs    Vocation  Retired      Associate Professor   Overall Cognitive Status  Within Functional Limits for tasks assessed      Standardized Balance Assessment   Standardized Balance Assessment  Berg Balance Test      Berg Balance Test   Sit to Stand  Able to stand without using hands and stabilize independently    Standing Unsupported  Able to stand safely 2 minutes    Sitting with Back Unsupported but Feet Supported on Floor or Stool  Able to sit safely and securely 2 minutes    Stand to Sit  Sits safely with minimal use of hands    Transfers  Able to transfer safely, minor use of hands    Standing Unsupported with Eyes Closed  Able to stand 10 seconds safely    Standing Ubsupported with Feet Together  Able to place feet together independently and stand 1 minute safely    From Standing, Reach Forward with Outstretched Arm  Can reach confidently >25 cm (10")    From Standing Position, Pick up Object from Floor  Able to pick up shoe safely and easily    From Standing Position, Turn to Look Behind Over each Shoulder  Looks behind from both sides and weight shifts well    Turn 360 Degrees  Able to turn 360 degrees safely one side only in 4 seconds or less  Standing Unsupported, Alternately Place Feet on Step/Stool  Able to stand independently and complete 8 steps >20 seconds    Standing Unsupported, One Foot in Front  Able to take small step independently and hold 30 seconds    Standing on One Leg  Tries to lift leg/unable to hold 3 seconds but remains standing independently    Total Score  49                Objective measurements completed on examination: See above findings.              PT Education - 12/07/17 1539    Education Details  HEP, stability     Person(s) Educated  Patient;Spouse    Methods  Explanation;Demonstration;Handout;Tactile cues    Comprehension  Verbalized understanding;Returned demonstration          PT Long Term Goals - 12/07/17 1542      PT LONG TERM GOAL #1   Title  Patient and wife will be compliant with HEP to improve stability and decrease fall risk     Baseline  Patient and wife demonstrate understanding of HEP     Time  1    Period  Days    Status  Achieved             Plan - 12/07/17 1540    Clinical Impression Statement  Patient is a pleasant 83 year old male who presents to physical therapy for CVA. Patient and wife do not desire further therapy and were given two exercises to add to current HEP. Patient demonstrated good capacity for ambulation with 6 min walk test 1068 ft, and 10 MWT >41m/s. BERG 49/56 due to limited single limb stance on L side. ABC 95%, LEFS 74/80. Due to patient request and independence in functional mobility this will be a one time visit.     History and Personal Factors relevant to plan of care:  This patient presents with , 1- 2, , personal factors/ comorbidities , and 1-2,  body elements including body structures and functions, activity limitations and or participation restrictions. Patient's condition is stable    Clinical Presentation  Stable    Clinical Presentation due to:  no pain, exercises 2x/day     Clinical Decision Making  Low    PT  Frequency  One time visit    PT Treatment/Interventions  Patient/family education;Balance training    PT Home Exercise Plan  see sheet    Recommended Other Services  n/a    Consulted and Agree with Plan of Care  Patient;Family member/caregiver    Family Member Consulted  wife       Patient will benefit from skilled therapeutic intervention in order to improve the following deficits and impairments:     Visit Diagnosis: Other abnormalities of gait and mobility     Problem List Patient Active Problem List   Diagnosis Date Noted  . Humeral head fracture, left, closed, initial encounter 09/22/2017  . History of cerebrovascular accident (CVA) with residual deficit 09/09/2017  . CKD (chronic kidney disease), stage III ([place]) 09/01/2017  . Elevated PSA, between 10 and less than 20 ng/ml 07/22/2016  . Bilateral lower extremity edema 07/22/2016  . Abnormal glucose 07/15/2016  . BPH with obstruction/lower urinary tract symptoms 04/10/2016  . Hypertension 04/10/2016  . Memory loss 04/10/2016  . Gastric outlet obstruction 12/02/2014  . Generalized abdominal pain   . Benign fibroma of prostate 01/02/2011  . HLD (hyperlipidemia) 01/02/2011  . Obesity (BMI  30.0-34.9) 01/02/2011   Janna Arch, PT, DPT   12/07/2017, 3:43 PM  [place] MAIN [facility] SERVICES [address] [place], [place], [address] Phone: [phone]   Fax:  [phone]  Name: [name] MRN: [id number] Date of Birth: 05-Apr-1935

## 2017-12-07 NOTE — Patient Instructions (Signed)
Access Code: [email or web address]  URL: [email or web address]  Date: 12/07/2017  Prepared by: Janna Arch   Exercises  Standing Tandem Balance with Counter Support - 2 reps - 1 sets - 30 hold - 1x daily - 7x weekly  Standing March with Unilateral Counter Support - 10 reps - 1 sets - 5 hold - 1x daily - 7x weekly

## 2017-12-09 ENCOUNTER — Ambulatory Visit: Payer: Medicare Other | Admitting: Physical Therapy

## 2017-12-13 ENCOUNTER — Telehealth: Payer: Self-pay | Admitting: Family Medicine

## 2017-12-13 NOTE — Telephone Encounter (Signed)
[name] is missing a home health care order for pt that for faxed on 8/7, begin date 7/21.  Please call [phone] and fax [phone].

## 2017-12-14 ENCOUNTER — Ambulatory Visit: Payer: Medicare Other | Admitting: Physical Therapy

## 2017-12-15 DIAGNOSIS — R2689 Other abnormalities of gait and mobility: Secondary | ICD-10-CM | POA: Diagnosis not present

## 2017-12-15 DIAGNOSIS — M5416 Radiculopathy, lumbar region: Secondary | ICD-10-CM | POA: Diagnosis not present

## 2017-12-15 NOTE — Telephone Encounter (Signed)
Received fax. Signed form and dated 12/15/17. It is in outbox to be faxed as of 12/15/17.  Should be faxed tomorrow 12/16/17.  Nobie Putnam, [facility] 12/15/2017, 6:18 PM

## 2017-12-16 ENCOUNTER — Ambulatory Visit: Payer: Medicare Other | Admitting: Physical Therapy

## 2017-12-21 ENCOUNTER — Ambulatory Visit: Payer: Medicare Other | Admitting: Physical Therapy

## 2017-12-23 ENCOUNTER — Ambulatory Visit: Payer: Medicare Other | Admitting: Physical Therapy

## 2017-12-28 ENCOUNTER — Ambulatory Visit: Payer: Medicare Other | Admitting: Physical Therapy

## 2017-12-30 ENCOUNTER — Ambulatory Visit: Payer: Medicare Other | Admitting: Physical Therapy

## 2018-01-04 ENCOUNTER — Ambulatory Visit: Payer: Medicare Other | Admitting: Physical Therapy

## 2018-01-06 ENCOUNTER — Encounter: Payer: Self-pay | Admitting: Family Medicine

## 2018-01-06 ENCOUNTER — Ambulatory Visit (INDEPENDENT_AMBULATORY_CARE_PROVIDER_SITE_OTHER): Payer: Medicare Other | Admitting: Family Medicine

## 2018-01-06 ENCOUNTER — Ambulatory Visit: Payer: Medicare Other | Admitting: Physical Therapy

## 2018-01-06 VITALS — BP 103/56 | HR 71 | Temp 98.1°F | Resp 16 | Ht 71.0 in | Wt 217.0 lb

## 2018-01-06 DIAGNOSIS — J3089 Other allergic rhinitis: Secondary | ICD-10-CM

## 2018-01-06 DIAGNOSIS — I6930 Unspecified sequelae of cerebral infarction: Secondary | ICD-10-CM | POA: Diagnosis not present

## 2018-01-06 DIAGNOSIS — R42 Dizziness and giddiness: Secondary | ICD-10-CM | POA: Diagnosis not present

## 2018-01-06 MED ORDER — IPRATROPIUM BROMIDE 0.06 % NA SOLN: 2.0000 | Freq: Four times a day (QID) | NASAL | 0 refills | Status: DC

## 2018-01-06 MED ORDER — FLUTICASONE PROPIONATE 50 MCG/ACT NA SUSP: 2.0000 | Freq: Every day | NASAL | 1 refills | Status: DC

## 2018-01-06 NOTE — Progress Notes (Signed)
Subjective:    Patient ID: [name], male    DOB: December 22, 1934, 83 y.o.   MRN: [id number]  [name] is a 83 y.o. male presenting on 01/06/2018 for Fatigue ( History of CVA, allegies, nasal congestion onset yesterday)   HPI   Follow-up History of CVA with residual weakness / Dizziness Episodes Last visit 09/2017, interval update has completed SNF rehab and [facility] therapy, then referred to outpatient PT. He continues to do home exercises up to 4 exercise twice a day with gradual improvement. - Today he reports concern for chronic episodes of dizziness, seems to be persistent. This is a newer onset problem after CVA that has improved some, he does not have Neurologist. Not seen Vestibular rehab for balance. - Describes brief episodes of dizzy and woozy, worse with prolonged standing >5-10 min, may feel "wobbly" otherwise does not seem to trigger upon first standing up. - His strength remains improved - Regarding daily function, he is able to feed himself and do basic meal prep. He is no longer comfortable driving a vehicle. He is able to do most self care including dressing, toileting and bathing, mostly with sponge bathing has not resumed regular showers. He does not have significant problem with vision, he is s/p cataract removal after many years. - Given history of CVA and he still has some difficulty with memory, he has problems with his medication management, in knowing which medications to take and when.  Denies fall, head injury, worse numbness tingling, worsening weakness, hearing loss, headache. Syncope  Additional complaint - Sinusitis / Allergic - history in past with improvement on Flonase, needs new rx, recently some early sinus symptoms with congestion that have improved, now has some residual sinus pressure  Health Maintenance: Due high dose flu vaccine, will return or get at pharmacy  Depression screen [facility] 2/9 10/12/2017 09/21/2017 09/08/2017  Decreased Interest 0 0 0  Down,  Depressed, Hopeless 0 0 0  PHQ - 2 Score 0 0 0    Social History   Tobacco Use  . Smoking status: Former Research scientist (life sciences)  . Smokeless tobacco: Former Network engineer Use Topics  . Alcohol use: No  . Drug use: No    Review of Systems Per HPI unless specifically indicated above     Objective:    BP (!) 103/56   Pulse 71   Temp 98.1 F (36.7 C) (Oral)   Resp 16   Ht 5\' 11"  (1.803 m)   Wt 217 lb (98.4 kg)   SpO2 98%   BMI 30.27 kg/m   Wt Readings from Last 3 Encounters:  01/06/18 217 lb (98.4 kg)  10/12/17 230 lb (104.3 kg)  09/18/17 230 lb (104.3 kg)    Physical Exam  Constitutional: He is oriented to person, place, and time. He appears well-developed and well-nourished. No distress.  Elderly 83 year old male, comfortable, cooperative, no longer in wheelchair or with walker  HENT:  Head: Normocephalic and atraumatic.  Mouth/Throat: Oropharynx is clear and moist.  Frontal / maxillary sinuses non-tender. Nares patent without purulence or edema. Bilateral TMs clear without erythema, effusion or bulging. Oropharynx clear without erythema, exudates, edema or asymmetry.  Eyes: Conjunctivae are normal. Right eye exhibits no discharge. Left eye exhibits no discharge.  Neck: Normal range of motion. Neck supple. No thyromegaly present.  Cardiovascular: Normal rate, regular rhythm, normal heart sounds and intact distal pulses.  No murmur heard. Pulmonary/Chest: Effort normal and breath sounds normal. No respiratory distress. He has  no wheezes. He has no rales.  Musculoskeletal: Normal range of motion. He exhibits no edema (mild persistent non pitting) or tenderness.  Upper / Lower Extremities: - Normal muscle tone, strength bilateral upper extremities 5/5, lower extremities 5/5 - Bilateral shoulders, knees, wrist, ankles without deformity, tenderness, effusion  Significantly improved range of motion bilateral shoulders forward flex, abduction, internal rotation  Good range of motion  lower extremities as well.  Able to stand from seated position out of wheelchair without cane or any assistance. Able to walk unassisted without any device  Lymphadenopathy:    He has no cervical adenopathy.  Neurological: He is alert and oriented to person, place, and time. No cranial nerve deficit or sensory deficit. He exhibits normal muscle tone.  Speech is normal  Seems resolved L sided residual facial weakness  Skin: Skin is warm and dry. No rash noted. He is not diaphoretic. No erythema.  Psychiatric: He has a normal mood and affect. His behavior is normal.  Well groomed, good eye contact, normal speech and thoughts  Memory recall and short term remains poor  Nursing note and vitals reviewed.        Assessment & Plan:   Problem List Items Addressed This Visit    History of cerebrovascular accident (CVA) with residual deficit - Primary   Relevant Orders   Ambulatory referral to Neurology    Other Visit Diagnoses    Dizzinesses       Relevant Orders   Ambulatory referral to Neurology  Clinically significant gradual improvement post CVA still, has lingering dizziness and seems to be balance issue, likely neurological given description, not seem consistent with Vertigo by history. No other known etiology.  Referral to [facility] Neurology for additional evaluation post-CVA patient with balance disorder residual - Additionally consider trial holding alpha blocker for BPH, may be causing some of his symptoms Follow-up     Seasonal allergic rhinitis due to other allergic trigger     Not consistent with bacterial sinusitis Start nasal steroid Flonase 2 sprays in each nostril daily for 4-6 weeks, may repeat course seasonally or as needed Start Atrovent nasal spray decongestant 2 sprays in each nostril up to 4 times daily for 7 days     Relevant Medications   fluticasone (FLONASE) 50 MCG/ACT nasal spray   ipratropium (ATROVENT) 0.06 % nasal spray      Meds ordered this  encounter  Medications  . fluticasone (FLONASE) 50 MCG/ACT nasal spray    Sig: Place 2 sprays into both nostrils daily. Use for 4-6 weeks then stop and use seasonally or as needed.    Dispense:  48 g    Refill:  1  . ipratropium (ATROVENT) 0.06 % nasal spray    Sig: Place 2 sprays into both nostrils 4 (four) times daily. As needed to dry up or decongest. For up to 5-7 days then stop.    Dispense:  15 mL    Refill:  0    Orders Placed This Encounter  Procedures  . Ambulatory referral to Neurology    Referral Priority:   Routine    Referral Type:   Consultation    Referral Reason:   Specialty Services Required    Referred to Provider:   Vladimir Crofts, MD    Requested Specialty:   Neurology    Number of Visits Requested:   1    Follow up plan: Return in about 6 weeks (around 02/17/2018) for re-schedule from 10/3 to 4-6 weeks from now,  after Neurology - f/u CVA, MMSE.  Nobie Putnam, [facility] 01/06/2018, 9:21 AM

## 2018-01-06 NOTE — Patient Instructions (Addendum)
Thank you for coming to the office today.  For dizziness, I recommend referral to Neurologist for 2nd opinion after stroke.  Stay tuned for appointment  [facility] - Neurology Dept [place], [place] [address] Phone: [phone]  ------------------------------------------------------  In meantime, try to hold to Terazosin 10mg  at bedtime (prostate / BP pill) for a few days, as long as you can still urinate. See if your symptoms of dizziness improve. If they do, then let me know, may need to find a different medicine instead of this one.  If not improving then RESTART med and discuss with Neurologist.  1. It sounds like you have persistent Sinus Congestion or "Rhinosinusitis" - I do not think that this is a Bacterial Sinus Infection. Usually these are caused by Viruses or Allergies, and will run it's course in about 7 to 10 days. - No antibiotics are needed - restart nasal steroid Flonase 2 sprays in each nostril daily for 4-6 weeks, may repeat course seasonally or as needed  In short term - sent rx to [facility] - Start Atrovent nasal spray decongestant 2 sprays in each nostril up to 4 times daily for 7 days  - Improve hydration by drinking plenty of clear fluids (water, gatorade) to reduce secretions and thin congestion - Congestion draining down throat can cause irritation. May try warm herbal tea with honey, cough drops  If you develop persistent fever >101F for at least 3 consecutive days, headaches with sinus pain or pressure or persistent earache, please schedule a follow-up evaluation within next few days to week.   Please schedule a Follow-up Appointment to: Return in about 6 weeks (around 02/17/2018) for re-schedule from 10/3 to 4-6 weeks from now, after Neurology - f/u CVA, MMSE.  If you have any other questions or concerns, please feel free to call the office or send a message through [place]. You may also schedule an earlier appointment if  necessary.  Additionally, you may be receiving a survey about your experience at our office within a few days to 1 week by e-mail or mail. We value your feedback.  Nobie Putnam, DO [place]

## 2018-01-11 ENCOUNTER — Ambulatory Visit: Payer: Medicare Other | Admitting: Physical Therapy

## 2018-01-13 ENCOUNTER — Ambulatory Visit: Payer: Medicare Other | Admitting: Physical Therapy

## 2018-01-18 ENCOUNTER — Ambulatory Visit: Payer: Medicare Other | Admitting: Physical Therapy

## 2018-01-20 ENCOUNTER — Ambulatory Visit: Payer: Medicare Other | Admitting: Physical Therapy

## 2018-01-20 ENCOUNTER — Ambulatory Visit: Payer: Medicare Other | Admitting: Family Medicine

## 2018-01-24 DIAGNOSIS — R2689 Other abnormalities of gait and mobility: Secondary | ICD-10-CM | POA: Diagnosis not present

## 2018-01-25 ENCOUNTER — Ambulatory Visit: Payer: Medicare Other | Admitting: Physical Therapy

## 2018-01-27 ENCOUNTER — Ambulatory Visit: Payer: Medicare Other | Admitting: Physical Therapy

## 2018-02-01 ENCOUNTER — Ambulatory Visit: Payer: Medicare Other | Admitting: Physical Therapy

## 2018-02-01 DIAGNOSIS — R2689 Other abnormalities of gait and mobility: Secondary | ICD-10-CM | POA: Insufficient documentation

## 2018-02-03 ENCOUNTER — Ambulatory Visit: Payer: Medicare Other | Admitting: Physical Therapy

## 2018-02-03 DIAGNOSIS — Z23 Encounter for immunization: Secondary | ICD-10-CM | POA: Diagnosis not present

## 2018-02-08 ENCOUNTER — Encounter: Payer: Self-pay | Admitting: Family Medicine

## 2018-02-08 ENCOUNTER — Ambulatory Visit (INDEPENDENT_AMBULATORY_CARE_PROVIDER_SITE_OTHER): Payer: Medicare Other | Admitting: Family Medicine

## 2018-02-08 VITALS — BP 102/54 | HR 68 | Temp 97.5°F | Resp 16 | Ht 71.0 in | Wt 218.6 lb

## 2018-02-08 DIAGNOSIS — L570 Actinic keratosis: Secondary | ICD-10-CM

## 2018-02-08 DIAGNOSIS — I6930 Unspecified sequelae of cerebral infarction: Secondary | ICD-10-CM

## 2018-02-08 DIAGNOSIS — N401 Enlarged prostate with lower urinary tract symptoms: Secondary | ICD-10-CM

## 2018-02-08 DIAGNOSIS — I10 Essential (primary) hypertension: Secondary | ICD-10-CM

## 2018-02-08 DIAGNOSIS — N138 Other obstructive and reflux uropathy: Secondary | ICD-10-CM

## 2018-02-08 MED ORDER — TERAZOSIN HCL 5 MG PO CAPS: 5.0000 mg | ORAL_CAPSULE | Freq: Every day | ORAL | 1 refills | Status: DC

## 2018-02-08 NOTE — Progress Notes (Signed)
Subjective:    Patient ID: ADE STMARIE, male    DOB: [birth year], 83 y.o.   MRN: [id number]  [name] is a 83 y.o. male presenting on 02/08/2018 for Hypertension   HPI   Follow-up History of CVA with residual weakness / Dizziness Episodes Recent updates, he has completed PT and no longer doing this, was told does not need anymore, he was referred to [facility] Neurology previously, established with them on 01/24/18 for imbalance post CVA, they recommended adjust his alpha blocker for BPH, otherwise no new therapy recommended, he did not recall much of the recommendations from their visit, he and wife are asking a lot of questions about CVA and long term effects. He feels 95% better but still issue with dizzy at homes and some memory problems. - Still describes brief episodes of dizzy and woozy, worse with prolonged standing >5-10 min, may feel "wobbly" otherwise does not seem to trigger upon first standing up. - His strength remains improved - Regarding daily function, he is able to feed himself and do basic meal prep. He is no longer comfortable driving a vehicle. He is able to do most self care including dressing, toileting and bathing, mostly with sponge bathing has not resumed regular showers. He does not have significant problem with vision, he is s/p cataract removal after many years. - Given history of CVA and he still has some difficulty with memory, he has problems with his medication management, in knowing which medications to take and when.  - Neuro recommended that he continue plavix 75 and ASA 83 Denies fall, head injury, worse numbness tingling, worsening weakness, hearing loss, headache. Syncope  H/o Elevated BPH w/ PSA - he has not returned to Urology, was overdue, they were treating him and he plans to return now.  Additional complaint - Admits some slightly darker stool episode that was "sticky" for few days, temporary then resolved no further episodes, denies rectal bleeding or  actual blood in stool, abdominal pain, unintentional wt loss or other related symptoms.  Health Maintenance: UTD Flu vaccine already  Depression screen [facility] 2/9 02/08/2018 10/12/2017 09/21/2017  Decreased Interest 0 0 0  Down, Depressed, Hopeless 0 0 0  PHQ - 2 Score 0 0 0   No flowsheet data found.  Social History   Tobacco Use  . Smoking status: Former Research scientist (life sciences)  . Smokeless tobacco: Former Network engineer Use Topics  . Alcohol use: No  . Drug use: No    Review of Systems Per HPI unless specifically indicated above     Objective:    BP (!) 102/54   Pulse 68   Temp (!) 97.5 F (36.4 C) (Oral)   Resp 16   Ht 5\' 11"  (1.803 m)   Wt 218 lb 9.6 oz (99.2 kg)   BMI 30.49 kg/m   Wt Readings from Last 3 Encounters:  02/08/18 218 lb 9.6 oz (99.2 kg)  01/06/18 217 lb (98.4 kg)  10/12/17 230 lb (104.3 kg)    Physical Exam  Constitutional: He is oriented to person, place, and time. He appears well-developed and well-nourished. No distress.  Elderly 83 year old male, comfortable, cooperative, no longer in wheelchair or with walker  HENT:  Head: Normocephalic and atraumatic.  Mouth/Throat: Oropharynx is clear and moist.  Eyes: Conjunctivae are normal. Right eye exhibits no discharge. Left eye exhibits no discharge.  Neck: Normal range of motion. Neck supple. No thyromegaly present.  Cardiovascular: Normal rate, regular rhythm, normal heart sounds  and intact distal pulses.  No murmur heard. Pulmonary/Chest: Effort normal and breath sounds normal. No respiratory distress. He has no wheezes. He has no rales.  Musculoskeletal: Normal range of motion. He exhibits no edema (mild persistent non pitting) or tenderness.  Upper / Lower Extremities: - Normal muscle tone, strength bilateral upper extremities 5/5, lower extremities 5/5 - Bilateral shoulders, knees, wrist, ankles without deformity, tenderness, effusion  Significantly improved range of motion bilateral shoulders forward flex,  abduction, internal rotation  Good range of motion lower extremities as well.  Able to stand from seated position out of wheelchair without cane or any assistance. Able to walk unassisted without any device  Lymphadenopathy:    He has no cervical adenopathy.  Neurological: He is alert and oriented to person, place, and time. No cranial nerve deficit or sensory deficit. He exhibits normal muscle tone.  Speech is normal  Seems resolved L sided residual facial weakness  Skin: Skin is warm and dry. No rash noted. He is not diaphoretic. No erythema.  Face - left corner of mouth moderately sized keratotic lesion larger, causing irritation to patient, without inflammation or erythema or open ulceration  Psychiatric: He has a normal mood and affect. His behavior is normal.  Well groomed, good eye contact, normal speech and thoughts  Memory recall and short term remains poor  Nursing note and vitals reviewed.  Results for orders placed or performed in visit on 10/04/17  PSA  Result Value Ref Range   Prostate Specific Ag, Serum 17.9 (H) 0.0 - 4.0 ng/mL      Assessment & Plan:   Problem List Items Addressed This Visit    BPH with obstruction/lower urinary tract symptoms Improved on alpha blocker, however now side effects of dizziness most likely on high dose Has not returned to Urology BUA - will schedule REDUCE dose today Terazosin from 10 to 5mg  to see if reduce side effects If difficulty urinating may follow-up sooner    Relevant Medications   terazosin (HYTRIN) 5 MG capsule   History of cerebrovascular accident (CVA) with residual deficit - Primary Reassurance, stable chronic problem, seems not worsening Recent Neuro eval seems to suggest likely dizzy from medication , less likely CVA Has completed PT - no further treatment Continues DAPT Plavix 75 and ASA 81 from Neurology  REDUCE alpha blocker as above, follow-up May return to neuro as planned    Hypertension Low normal BP  currently, without concern Likely too low with dizziness vs orthostatic on alpha blocker Will monitor BP after med change    Relevant Medications   terazosin (HYTRIN) 5 MG capsule    Other Visit Diagnoses    Keratotic lesion       Relevant Orders   Ambulatory referral to Dermatology for excision - given location      #Episode of Darker Stool Recent concern, on DAPT, seems resolved Currently asymptomatic Advised strict return criteria if happens again or new concerns as reviewed.  Meds ordered this encounter  Medications  . terazosin (HYTRIN) 5 MG capsule    Sig: Take 1 capsule (5 mg total) by mouth at bedtime.    Dispense:  90 capsule    Refill:  1    Dose reduced from 10 to 5   Orders Placed This Encounter  Procedures  . Ambulatory referral to Dermatology    Referral Priority:   Routine    Referral Type:   Consultation    Referral Reason:   Specialty Services Required  Referred to Provider:   Dasher, Rayvon Char, MD    Requested Specialty:   Dermatology    Number of Visits Requested:   1   Follow up plan: Return in about 6 months (around 08/10/2018) for Yearly Medicare Checkup.  Future labs ordered for 08/08/18  Nobie Putnam, [facility] 02/08/2018, 2:11 PM

## 2018-02-08 NOTE — Patient Instructions (Addendum)
Thank you for coming to the office today.  Due to dizziness - and recommendation from Neurology - please REDUCE Terazosin from 10mg  green capsule down to 5mg  - NEW CAPSULE sent to [facility] old one and START new one  This should reduce dizzy spells  Please schedule follow-up for Prostate / Urinary with Dr Nickie Retort, MD - they asked you to return in summer but this apt was missed. Now we are lowering the prostate med Terazosin, and try this for a few weeks and then schedule to follow-up before end of the year with Urology  [facility] [address], [place], [place] [address] [phone]  --------------- Referral to Derm - for the spot on your mouth  [facility] [place], [place] [address] Phone: [phone]  DUE for FASTING BLOOD WORK (no food or drink after midnight before the lab appointment, only water or coffee without cream/sugar on the morning of)  SCHEDULE "Lab Only" visit in the morning at the clinic for lab draw in 6 MONTHS   - Make sure Lab Only appointment is at about 1 week before your next appointment, so that results will be available  For Lab Results, once available within 2-3 days of blood draw, you can can log in to MyChart online to view your results and a brief explanation. Also, we can discuss results at next follow-up visit.  If you have recurrent episodes of abdominal bowel movements again then notify office or seek more help immediately at hospital if worsening or rectal bleeding.  Please schedule a Follow-up Appointment to: Return in about 6 months (around 08/10/2018) for Yearly Medicare Checkup.  If you have any other questions or concerns, please feel free to call the office or send a message through [place]. You may also schedule an earlier appointment if necessary.  Additionally, you may be receiving a survey about your experience at our office within a few days to 1 week by  e-mail or mail. We value your feedback.  Nobie Putnam, DO [place]

## 2018-02-09 ENCOUNTER — Other Ambulatory Visit: Payer: Self-pay | Admitting: Family Medicine

## 2018-02-09 DIAGNOSIS — R972 Elevated prostate specific antigen [PSA]: Secondary | ICD-10-CM

## 2018-02-09 DIAGNOSIS — I10 Essential (primary) hypertension: Secondary | ICD-10-CM

## 2018-02-09 DIAGNOSIS — I6930 Unspecified sequelae of cerebral infarction: Secondary | ICD-10-CM

## 2018-02-09 DIAGNOSIS — E782 Mixed hyperlipidemia: Secondary | ICD-10-CM

## 2018-02-09 DIAGNOSIS — E66811 Obesity, class 1: Secondary | ICD-10-CM

## 2018-02-09 DIAGNOSIS — E669 Obesity, unspecified: Secondary | ICD-10-CM

## 2018-02-09 DIAGNOSIS — N183 Chronic kidney disease, stage 3 unspecified: Secondary | ICD-10-CM

## 2018-02-09 DIAGNOSIS — N138 Other obstructive and reflux uropathy: Secondary | ICD-10-CM

## 2018-02-09 DIAGNOSIS — R7309 Other abnormal glucose: Secondary | ICD-10-CM

## 2018-02-09 DIAGNOSIS — N401 Enlarged prostate with lower urinary tract symptoms: Secondary | ICD-10-CM

## 2018-02-10 ENCOUNTER — Ambulatory Visit: Payer: Medicare Other | Admitting: Family Medicine

## 2018-03-02 ENCOUNTER — Encounter: Payer: Self-pay | Admitting: Family Medicine

## 2018-03-02 ENCOUNTER — Ambulatory Visit (INDEPENDENT_AMBULATORY_CARE_PROVIDER_SITE_OTHER): Payer: Medicare Other | Admitting: Family Medicine

## 2018-03-02 VITALS — BP 121/56 | HR 69 | Temp 97.9°F | Resp 16 | Ht 71.0 in | Wt 218.0 lb

## 2018-03-02 DIAGNOSIS — H9192 Unspecified hearing loss, left ear: Secondary | ICD-10-CM

## 2018-03-02 DIAGNOSIS — J3089 Other allergic rhinitis: Secondary | ICD-10-CM

## 2018-03-02 NOTE — Progress Notes (Signed)
Subjective:    Patient ID: OSHAY STRANAHAN, male    DOB: [birth year], 83 y.o.   MRN: [id number]  TREON KEHL is a 83 y.o. male presenting on 03/02/2018 for Cerumen Impaction  Accompanied by wife, Toyoko, who provides additional history  HPI  Bilateral Reduced Hearing Reports symptoms worse within past week with reduced hearing, felt like he was "in a tunnel". He has used some ear drops at home, without any result. - Prior history had ears flushed with wax removal >30 years ago - Admits some rhinorrhea with allergies, without worsening symptoms - he has Flonase, rarely using regularly - Denies ear pain, fullness or drainage, sinus pain  Health Maintenance: UTD Flu vaccine.  Depression screen [facility] 2/9 03/02/2018 02/08/2018 10/12/2017  Decreased Interest 0 0 0  Down, Depressed, Hopeless 0 0 0  PHQ - 2 Score 0 0 0    Social History   Tobacco Use  . Smoking status: Former Research scientist (life sciences)  . Smokeless tobacco: Former Network engineer Use Topics  . Alcohol use: No  . Drug use: No    Review of Systems Per HPI unless specifically indicated above     Objective:    BP (!) 121/56   Pulse 69   Temp 97.9 F (36.6 C) (Oral)   Resp 16   Ht _0  (1.803 m)   Wt 218 lb (98.9 kg)   BMI 30.40 kg/m   Wt Readings from Last 3 Encounters:  03/02/18 218 lb (98.9 kg)  02/08/18 218 lb 9.6 oz (99.2 kg)  01/06/18 217 lb (98.4 kg)    Physical Exam  Constitutional: He appears well-developed and well-nourished. No distress.  HENT:  Head: Normocephalic and atraumatic.  Mouth/Throat: Oropharynx is clear and moist.  Frontal / maxillary sinuses non-tender. Nares with turbinate edema and congestion without purulence.  R TM visualized normal with landmarks, without erythema or bulging, only mild clear effusion. No cerumen.  L TM unable to be visualized due to ear drops already in place, patient used and also they were placed during his visit in triage by nursing.  Oropharynx without erythema,  exudates, edema or asymmetry.  Eyes: Conjunctivae are normal. Right eye exhibits no discharge. Left eye exhibits no discharge.  Neck: Normal range of motion. Neck supple.  Cardiovascular: Normal rate.  Pulmonary/Chest: Effort normal.  Neurological: He is alert.  Skin: Skin is warm and dry. He is not diaphoretic.  Nursing note and vitals reviewed.  ________________________________________________________ PROCEDURE NOTE Date: 03/02/18 Left Ear Lavage / Cerumen Removal Discussed benefits and risks (including pain / discomforts, dizziness, minor abrasion of ear canal). Verbal consent given by patient. Medication:  carbamide peroxide ear drops, Ear Lavage Solution (warm water + hydrogen peroxide) Performed by Dr Parks Ranger Several drops of carbamide peroxide placed into L ear only, allowed to sit for few minutes. Ear lavage solution flushed into L ear in attempt to dislodge and remove any ear wax. Results did not yield any cerumen. Upon completing the flushing after brief period, ear canal was viewed again. No evidence of cerumen identified. View of L TM obtained, has some fullness and clear effusion present.     Assessment & Plan:   Problem List Items Addressed This Visit    None    Visit Diagnoses    Hearing reduced, left    -  Primary   Seasonal allergic rhinitis due to other allergic trigger          Consistent with acute rhinosinusitis, likely initially viral URI  vs allergic rhinitis component with secondary L ear eustachian tube dysfunction vs effusion - likely affecting his hearing. - No sign of bacterial infection - No cerumen impaction - Some known history of presbycusis  Plan: S/p ear lavage - actually improved hearing after flushing.  1. Start back on Flonase 2 sprays in each nostril daily for next 4-6 weeks, then may stop and use seasonally or as needed 2. May add back anti histamine daily 3. May use home ear wax kit if needed in future 4. Return criteria reviewed -  if hearing is still reduced or not improved after 2-4 weeks call back and we can refer to ENT for 2nd opinion     No orders of the defined types were placed in this encounter.   Follow up plan: Return in about 2 weeks (around 03/16/2018) for hearing loss.  Nobie Putnam, [facility] 03/02/2018, 3:22 PM

## 2018-03-02 NOTE — Patient Instructions (Addendum)
Thank you for coming to the office today.  No wax removed - it looks like some swelling within ear canal and some residual fluid.  Start nasal steroid Flonase 2 sprays in each nostril daily for 4-6 weeks, may repeat course seasonally or as needed  May use home ear wax kit if need  If not improving by 2-4 weeks, call us back and we can refer you to ENT for 2nd opinion   Please schedule a Follow-up Appointment to: Return in about 2 weeks (around 03/16/2018) for hearing loss.  If you have any other questions or concerns, please feel free to call the office or send a message through [place]. You may also schedule an earlier appointment if necessary.  Additionally, you may be receiving a survey about your experience at our office within a few days to 1 week by e-mail or mail. We value your feedback.  Nobie Putnam, DO [place]

## 2018-03-14 ENCOUNTER — Telehealth: Payer: Self-pay | Admitting: Nurse Practitioner

## 2018-03-14 DIAGNOSIS — H9192 Unspecified hearing loss, left ear: Secondary | ICD-10-CM

## 2018-03-14 NOTE — Telephone Encounter (Signed)
Pt's wife asked about a referral to ENT.  Her call back number is [phone]

## 2018-03-14 NOTE — Telephone Encounter (Signed)
Last seen 03/02/18 see note, no cerumen, some sinusitis treated for allergies and effusion.  Still persistent hearing loss.  Referral to [facility] any available provider for evaluation of acute on chronic Left Ear hearing loss, history of ear effusion recently and sinus symptoms, however did not have any cerumen on last evaluation. Requesting ENT for evaluation clinically and also may need Audiology or other hearing testing in future.  Nobie Putnam, DO [facility] 03/14/2018, 6:11 PM

## 2018-03-15 DIAGNOSIS — L538 Other specified erythematous conditions: Secondary | ICD-10-CM | POA: Diagnosis not present

## 2018-03-15 DIAGNOSIS — B079 Viral wart, unspecified: Secondary | ICD-10-CM | POA: Diagnosis not present

## 2018-03-15 DIAGNOSIS — D3701 Neoplasm of uncertain behavior of lip: Secondary | ICD-10-CM | POA: Diagnosis not present

## 2018-03-15 DIAGNOSIS — D485 Neoplasm of uncertain behavior of skin: Secondary | ICD-10-CM | POA: Diagnosis not present

## 2018-03-21 ENCOUNTER — Telehealth: Payer: Self-pay | Admitting: Family Medicine

## 2018-03-21 NOTE — Telephone Encounter (Signed)
Phone number given to patient's spouse for [facility].

## 2018-03-21 NOTE — Telephone Encounter (Signed)
Pt  Wife called states that the ear drops was not working wants a referral to ENT.

## 2018-03-21 NOTE — Telephone Encounter (Signed)
This is a duplicate request.  We referred him to ENT back on 11/25 and it was faxed on 11/26 to [facility].  He should check with their office for updates.  Nobie Putnam, [facility] 03/21/2018, 11:56 AM

## 2018-03-28 ENCOUNTER — Other Ambulatory Visit: Payer: Self-pay

## 2018-03-28 ENCOUNTER — Ambulatory Visit
Admission: EM | Admit: 2018-03-28 | Discharge: 2018-03-28 | Disposition: A | Discharge status: Home / Self Care | Payer: Medicare Other | Attending: Family Medicine | Admitting: Family Medicine

## 2018-03-28 ENCOUNTER — Encounter: Payer: Self-pay | Admitting: Emergency Medicine

## 2018-03-28 DIAGNOSIS — H9193 Unspecified hearing loss, bilateral: Secondary | ICD-10-CM

## 2018-03-28 NOTE — Discharge Instructions (Addendum)
Continue using the Flonase on a daily basis.  Stop using hydrogen peroxide.  Follow-up with the ear nose and throat specialist.

## 2018-03-28 NOTE — ED Triage Notes (Signed)
Patient c/o hearing loss in both ears x 1 month. Patient has an appointment with ENT on December 19th.

## 2018-03-28 NOTE — ED Provider Notes (Addendum)
[facility]    CSN: [id number] Arrival date & time: 03/28/18  0904     History   Chief Complaint Chief Complaint  Patient presents with  . Hearing Problem    HPI [name] is a 83 y.o. male.   HPI  83 year old male presents with hearing loss in both ears he has had for 1 month.  He had a stroke in May 2019.  He was seen by his primary care physician in November with bilateral hearing reduction at the primary care physician had flushed out left ear with a return of some of his hearing on the left.  Now the patient complains of continued hearing loss drainage from both ears although he is using hydrogen peroxide 2-3 times daily and use of Flonase nasal spray since November.  He denies any dizziness although he does have residuals from his stroke and unsteady gait.  He states that he is very frustrated with the hearing loss.  He has an appointment with Dr. Tami Ribas 19 December.  Wife states that he felt he could not wait until that appointment as he is annoyed with the loss of hearing.  He denies any upper respiratory infections of recently.  He has had a history of seasonal allergies.         Past Medical History:  Diagnosis Date  . Bowel obstruction ([place])   . BPH (benign prostatic hypertrophy)   . Gastric outlet obstruction   . High cholesterol   . Hyperchloremia   . Hypertension   . Pancreatitis     Patient Active Problem List   Diagnosis Date Noted  . Humeral head fracture, left, closed, initial encounter 09/22/2017  . History of cerebrovascular accident (CVA) with residual deficit 09/09/2017  . CKD (chronic kidney disease), stage III ([place]) 09/01/2017  . Elevated PSA, between 10 and less than 20 ng/ml 07/22/2016  . Bilateral lower extremity edema 07/22/2016  . Abnormal glucose 07/15/2016  . BPH with obstruction/lower urinary tract symptoms 04/10/2016  . Hypertension 04/10/2016  . Memory loss 04/10/2016  . Gastric outlet obstruction 12/02/2014  .  Generalized abdominal pain   . Benign fibroma of prostate 01/02/2011  . HLD (hyperlipidemia) 01/02/2011  . Obesity (BMI 30.0-34.9) 01/02/2011    Past Surgical History:  Procedure Laterality Date  . ESOPHAGOGASTRODUODENOSCOPY N/A 12/03/2014   Procedure: ESOPHAGOGASTRODUODENOSCOPY (EGD);  Surgeon: Hulen Luster, MD;  Location: [facility] ENDOSCOPY;  Service: Endoscopy;  Laterality: N/A;       Home Medications    Prior to Admission medications   Medication Sig Start Date End Date Taking? Authorizing Provider  aspirin EC 81 MG tablet Take 81 mg by mouth daily.   Yes [provider]  atorvastatin (LIPITOR) 40 MG tablet Take 1 tablet (40 mg total) by mouth daily at 6 PM. 10/12/17  Yes Karamalegos, Devonne Doughty, DO  clopidogrel (PLAVIX) 75 MG tablet Take 1 tablet (75 mg total) by mouth daily. 10/12/17  Yes Karamalegos, Devonne Doughty, DO  fluticasone (FLONASE) 50 MCG/ACT nasal spray Place 2 sprays into both nostrils daily. Use for 4-6 weeks then stop and use seasonally or as needed. 01/06/18  Yes Karamalegos, Devonne Doughty, DO  lisinopril (PRINIVIL,ZESTRIL) 20 MG tablet Take 1 tablet (20 mg total) by mouth daily. 10/12/17  Yes Karamalegos, Devonne Doughty, DO  polyethylene glycol (MIRALAX / GLYCOLAX) packet Take 17 g by mouth daily. 09/12/17  Yes Wieting, Richard, MD  terazosin (HYTRIN) 5 MG capsule Take 1 capsule (5 mg total) by mouth at bedtime.  02/08/18  Yes Karamalegos, Devonne Doughty, DO  promethazine (PHENERGAN) 12.5 MG tablet Take 12.5 mg by mouth every 6 (six) hours as needed for nausea or vomiting.    [provider]    Family History Family History  Problem Relation Age of Onset  . Heart disease Mother   . Heart disease Father   . Heart disease Brother     Social History Social History   Tobacco Use  . Smoking status: Former Research scientist (life sciences)  . Smokeless tobacco: Former Network engineer Use Topics  . Alcohol use: No  . Drug use: No     Allergies   Patient has no known  allergies.   Review of Systems Review of Systems  Constitutional: Positive for activity change. Negative for appetite change, chills, fatigue and fever.  HENT: Positive for ear discharge, ear pain, hearing loss and tinnitus.   All other systems reviewed and are negative.    Physical Exam Triage Vital Signs ED Triage Vitals  Enc Vitals Group     BP 03/28/18 0920 118/65     Pulse Rate 03/28/18 0920 62     Resp 03/28/18 0920 18     Temp 03/28/18 0920 97.7 F (36.5 C)     Temp Source 03/28/18 0920 Oral     SpO2 03/28/18 0920 97 %     Weight 03/28/18 0918 220 lb (99.8 kg)     Height 03/28/18 0918 5\' 10"  (1.778 m)     Head Circumference --      Peak Flow --      Pain Score 03/28/18 0918 0     Pain Loc --      Pain Edu? --      Excl. in [place]? --    No data found.  Updated Vital Signs BP 118/65 (BP Location: Left Arm)   Pulse 62   Temp 97.7 F (36.5 C) (Oral)   Resp 18   Ht 5\' 10"  (1.778 m)   Wt 220 lb (99.8 kg)   SpO2 97%   BMI 31.57 kg/m   Visual Acuity Right Eye Distance:   Left Eye Distance:   Bilateral Distance:    Right Eye Near:   Left Eye Near:    Bilateral Near:     Physical Exam  Constitutional: He appears well-developed and well-nourished. No distress.  HENT:  Head: Normocephalic.  Nose: Nose normal.  Mouth/Throat: Oropharynx is clear and moist. No oropharyngeal exudate.  Examination of both ears shows the canals to be narrowed bilaterally.  There is no occlusion with cerumen.  Left TM appears very dull and dark.  Right TM also is dull.  There does not appear to be any bulging or retraction.  Skin: He is not diaphoretic.  Nursing note and vitals reviewed.    UC Treatments / Results  Labs (all labs ordered are listed, but only abnormal results are displayed) Labs Reviewed - No data to display  EKG None  Radiology No results found.  Procedures Procedures (including critical care time)  Medications Ordered in UC Medications - No data to  display  Initial Impression / Assessment and Plan / UC Course  I have reviewed the triage vital signs and the nursing notes.  Pertinent labs & imaging results that were available during my care of the patient were reviewed by me and considered in my medical decision making (see chart for details).   I have recommended to the patient that he continue to use the Flonase nasal spray.  Should  contact [facility] to see if there are any cancellations that they may be seen earlier.  This could be an eustachian tube dysfunction which is currently being treated with the Flonase or an effusion.  This will need to be addressed by the ENT.  In the meantime I have asked him to stop using the hydrogen peroxide and Q-tips.  The  discharge from ear should just be blotted with a tissue.  Mrs. Basu will call today to see if they can arrange an appointment sooner.   Final Clinical Impressions(s) / UC Diagnoses   Final diagnoses:  Bilateral hearing loss, unspecified hearing loss type     Discharge Instructions     Continue using the Flonase on a daily basis.  Stop using hydrogen peroxide.  Follow-up with the ear nose and throat specialist.   ED Prescriptions    None     Controlled Substance Prescriptions [place] Controlled Substance Registry consulted? Not Applicable   Lorin Picket, PA-C 03/28/18 1031    Lorin Picket, PA-C 03/28/18 1032

## 2018-04-07 DIAGNOSIS — H9193 Unspecified hearing loss, bilateral: Secondary | ICD-10-CM | POA: Diagnosis not present

## 2018-04-07 DIAGNOSIS — H9213 Otorrhea, bilateral: Secondary | ICD-10-CM | POA: Diagnosis not present

## 2018-08-07 ENCOUNTER — Emergency Department
Admission: EM | Admit: 2018-08-07 | Discharge: 2018-08-07 | Disposition: A | Discharge status: Home / Self Care | Payer: Medicare Other | Attending: Emergency Medicine | Admitting: Emergency Medicine

## 2018-08-07 ENCOUNTER — Other Ambulatory Visit: Payer: Self-pay

## 2018-08-07 DIAGNOSIS — Z87891 Personal history of nicotine dependence: Secondary | ICD-10-CM | POA: Insufficient documentation

## 2018-08-07 DIAGNOSIS — I10 Essential (primary) hypertension: Secondary | ICD-10-CM | POA: Diagnosis not present

## 2018-08-07 DIAGNOSIS — E785 Hyperlipidemia, unspecified: Secondary | ICD-10-CM | POA: Diagnosis not present

## 2018-08-07 LAB — BASIC METABOLIC PANEL
Anion gap: 9 (ref 5–15)
BUN: 20 mg/dL (ref 8–23)
CO2: 25 mmol/L (ref 22–32)
Calcium: 8.7 mg/dL — ABNORMAL LOW (ref 8.9–10.3)
Chloride: 107 mmol/L (ref 98–111)
Creatinine, Ser: 0.98 mg/dL (ref 0.61–1.24)
GFR calc Af Amer: >60 mL/min (ref >60)
GFR calc non Af Amer: >60 mL/min (ref >60)
Glucose, Bld: 125 mg/dL — ABNORMAL HIGH (ref 70–99)
Potassium: 4.1 mmol/L (ref 3.5–5.1)
Sodium: 141 mmol/L (ref 135–145)

## 2018-08-07 LAB — CBC WITH DIFFERENTIAL/PLATELET
Abs Immature Granulocytes: 0.01 10*3/uL (ref 0.00–0.07)
Basophils Absolute: 0.1 10*3/uL (ref 0.0–0.1)
Basophils Relative: 1 %
Eosinophils Absolute: 0.3 10*3/uL (ref 0.0–0.5)
Eosinophils Relative: 5 %
HCT: 39.4 % (ref 39.0–52.0)
Hemoglobin: 12.9 g/dL — ABNORMAL LOW (ref 13.0–17.0)
Immature Granulocytes: 0 %
Lymphocytes Relative: 35 %
Lymphs Abs: 2.2 10*3/uL (ref 0.7–4.0)
MCH: 32.4 pg (ref 26.0–34.0)
MCHC: 32.7 g/dL (ref 30.0–36.0)
MCV: 99 fL (ref 80.0–100.0)
Monocytes Absolute: 0.5 10*3/uL (ref 0.1–1.0)
Monocytes Relative: 9 %
Neutro Abs: 3.1 10*3/uL (ref 1.7–7.7)
Neutrophils Relative %: 50 %
Platelets: 166 10*3/uL (ref 150–400)
RBC: 3.98 MIL/uL — ABNORMAL LOW (ref 4.22–5.81)
RDW: 13.1 % (ref 11.5–15.5)
WBC: 6.2 10*3/uL (ref 4.0–10.5)
nRBC: 0 % (ref 0.0–0.2)

## 2018-08-07 LAB — TROPONIN I: Troponin I: <0.03 ng/mL (ref <0.03)

## 2018-08-07 NOTE — ED Triage Notes (Signed)
Reports took his blood pressure at home and it was 151, 2nd time it was 156, 3rd time it was 166.  Reports last at home was 177.  Patient denies any accompanying symptoms.

## 2018-08-07 NOTE — ED Provider Notes (Signed)
[facility] Emergency Department Provider Note       Time seen: ----------------------------------------- 10:08 PM on 08/07/2018 -----------------------------------------   I have reviewed the triage vital signs and the nursing notes.  HISTORY   Chief Complaint Hypertension    HPI [name] is a 84 y.o. male with a history of bowel obstruction, hyperlipidemia, hypertension, pancreatitis who presents to the ED for hypertension.  Patient states he checked his blood pressure daily and it has been under control running between 426 systolic and 834H systolic.  This afternoon it was 150, he subsequently rechecked it several times and it was increasing so he came to the ER for further evaluation.  He denies any fevers, chills, chest pain, shortness of breath, vomiting or diarrhea.  Past Medical History:  Diagnosis Date  . Bowel obstruction ([place])   . BPH (benign prostatic hypertrophy)   . Gastric outlet obstruction   . High cholesterol   . Hyperchloremia   . Hypertension   . Pancreatitis     Patient Active Problem List   Diagnosis Date Noted  . Humeral head fracture, left, closed, initial encounter 09/22/2017  . History of cerebrovascular accident (CVA) with residual deficit 09/09/2017  . CKD (chronic kidney disease), stage III ([place]) 09/01/2017  . Elevated PSA, between 10 and less than 20 ng/ml 07/22/2016  . Bilateral lower extremity edema 07/22/2016  . Abnormal glucose 07/15/2016  . BPH with obstruction/lower urinary tract symptoms 04/10/2016  . Hypertension 04/10/2016  . Memory loss 04/10/2016  . Gastric outlet obstruction 12/02/2014  . Generalized abdominal pain   . Benign fibroma of prostate 01/02/2011  . HLD (hyperlipidemia) 01/02/2011  . Obesity (BMI 30.0-34.9) 01/02/2011    Past Surgical History:  Procedure Laterality Date  . ESOPHAGOGASTRODUODENOSCOPY N/A 12/03/2014   Procedure: ESOPHAGOGASTRODUODENOSCOPY (EGD);  Surgeon: Hulen Luster, MD;   Location: [facility] ENDOSCOPY;  Service: Endoscopy;  Laterality: N/A;    Allergies Patient has no known allergies.  Social History Social History   Tobacco Use  . Smoking status: Former Research scientist (life sciences)  . Smokeless tobacco: Former Network engineer Use Topics  . Alcohol use: No  . Drug use: No   Review of Systems Constitutional: Negative for fever. Cardiovascular: Negative for chest pain. Respiratory: Negative for shortness of breath. Gastrointestinal: Negative for abdominal pain, vomiting and diarrhea. Musculoskeletal: Negative for back pain. Skin: Negative for rash. Neurological: Negative for headaches, focal weakness or numbness.  All systems negative/normal/unremarkable except as stated in the HPI  ____________________________________________   PHYSICAL EXAM:  VITAL SIGNS: ED Triage Vitals [08/07/18 2201]  Enc Vitals Group     BP (!) 185/75     Pulse Rate 65     Resp 18     Temp 97.7 F (36.5 C)     Temp Source Oral     SpO2 100 %     Weight 217 lb (98.4 kg)     Height 5\' 10"  (1.778 m)     Head Circumference      Peak Flow      Pain Score 0     Pain Loc      Pain Edu?      Excl. in [place]?     Constitutional: Alert and oriented. Well appearing and in no distress. Eyes: Conjunctivae are normal. Normal extraocular movements. Cardiovascular: Normal rate, regular rhythm. No murmurs, rubs, or gallops. Respiratory: Normal respiratory effort without tachypnea nor retractions. Breath sounds are clear and equal bilaterally. No wheezes/rales/rhonchi. Gastrointestinal: Soft and nontender. Normal bowel  sounds Musculoskeletal: Nontender with normal range of motion in extremities. No lower extremity tenderness nor edema. Neurologic:  Normal speech and language. No gross focal neurologic deficits are appreciated.  Skin:  Skin is warm, dry and intact. No rash noted. Psychiatric: Mood and affect are normal. Speech and behavior are normal.   ____________________________________________  ED COURSE:  As part of my medical decision making, I reviewed the following data within the [place] History obtained from family if available, nursing notes, old chart and ekg, as well as notes from prior ED visits. Patient presented for asymptomatic hypertension, we will assess with labs and imaging as indicated at this time.   Procedures  [name] was evaluated in Emergency Department on 08/07/2018 for the symptoms described in the history of present illness. He was evaluated in the context of the global COVID-19 pandemic, which necessitated consideration that the patient might be at risk for infection with the SARS-CoV-2 virus that causes COVID-19. Institutional protocols and algorithms that pertain to the evaluation of patients at risk for COVID-19 are in a state of rapid change based on information released by regulatory bodies including the CDC and federal and state organizations. These policies and algorithms were followed during the patient's care in the ED.  ____________________________________________   LABS (pertinent positives/negatives)  Labs Reviewed  BASIC METABOLIC PANEL - Abnormal; Notable for the following components:      Result Value   Glucose, Bld 125 (*)    Calcium 8.7 (*)    All other components within normal limits  TROPONIN I  CBC WITH DIFFERENTIAL/PLATELET  ____________________________________________   DIFFERENTIAL DIAGNOSIS   Asymptomatic hypertension, renal failure  FINAL ASSESSMENT AND PLAN  Asymptomatic hypertension   Plan: The patient had presented for elevated blood pressure. Patient's labs were reassuring.  Blood pressure appeared to be improving without any intervention in the ER.  He is cleared for outpatient follow-up with his doctor.   Laurence Aly, MD    Note: This note was generated in part or whole with voice recognition software. Voice recognition is usually  quite accurate but there are transcription errors that can and very often do occur. I apologize for any typographical errors that were not detected and corrected.     Earleen Newport, MD 08/07/18 2257

## 2018-08-11 ENCOUNTER — Ambulatory Visit (INDEPENDENT_AMBULATORY_CARE_PROVIDER_SITE_OTHER): Payer: Medicare Other | Admitting: Family Medicine

## 2018-08-11 ENCOUNTER — Other Ambulatory Visit: Payer: Self-pay

## 2018-08-11 ENCOUNTER — Encounter: Payer: Self-pay | Admitting: Family Medicine

## 2018-08-11 VITALS — BP 125/61

## 2018-08-11 DIAGNOSIS — N138 Other obstructive and reflux uropathy: Secondary | ICD-10-CM

## 2018-08-11 DIAGNOSIS — R972 Elevated prostate specific antigen [PSA]: Secondary | ICD-10-CM | POA: Diagnosis not present

## 2018-08-11 DIAGNOSIS — I10 Essential (primary) hypertension: Secondary | ICD-10-CM

## 2018-08-11 DIAGNOSIS — N401 Enlarged prostate with lower urinary tract symptoms: Secondary | ICD-10-CM | POA: Diagnosis not present

## 2018-08-11 MED ORDER — TERAZOSIN HCL 5 MG PO CAPS: 5.0000 mg | ORAL_CAPSULE | Freq: Every day | ORAL | 1 refills | Status: DC

## 2018-08-11 NOTE — Patient Instructions (Addendum)
Elevated PSA - please call back the urology office for prostate evaluation  [facility] -[address]st floor [place],  [place]  [address] Phone: [phone]  Refilled Terazosin  Please schedule a Follow-up Appointment to: Return in about 6 months (around 02/10/2019) for 6 month HTN.  If you have any other questions or concerns, please feel free to call the office or send a message through [place]. You may also schedule an earlier appointment if necessary.  Additionally, you may be receiving a survey about your experience at our office within a few days to 1 week by e-mail or mail. We value your feedback.  Nobie Putnam, DO [place]

## 2018-08-11 NOTE — Progress Notes (Signed)
Pt blood pressure Tuesday was 126/64, Wednesday 145/74, and today 125/61.

## 2018-08-11 NOTE — Progress Notes (Signed)
Virtual Visit via Telephone The purpose of this virtual visit is to provide medical care while limiting exposure to the novel coronavirus (COVID19) for both patient and office staff.  Consent was obtained for phone visit:  Yes.   Answered questions that patient had about telehealth interaction:  Yes.   I discussed the limitations, risks, security and privacy concerns of performing an evaluation and management service by telephone. I also discussed with the patient that there may be a patient responsible charge related to this service. The patient expressed understanding and agreed to proceed.  Patient Location: Home Provider Location: [facility])  ---------------------------------------------------------------------- Chief Complaint  Patient presents with  . Hypertension    intermittent elevated bp. He was seen at the ER on 4/19 for asymptomatic hypertension  . Puncture Wound     puntured wound from a saw between his thumb and index finger.  He was able to stop the bleeding, by running water over the injury and wrapping it with gauzes. Last Tdap injection 08/2017.    S: Reviewed CMA documentation. I have called patient and gathered additional HPI as follows:  CHRONIC HTN: Reports recent ED visit 4 days ago due to elevated BP SBP up to 177, monitored BP in ED and had some basic labs, his BP improved. He was concerned about elevated BP he was reassured and discharged. Current Meds - Lisinopril 20mg  daily, Terazosin 5mg  daily   Reports good compliance, took meds today. Tolerating well, w/o complaints. Lifestyle: - Diet: balanced diet - Exercise: walking Denies CP, dyspnea, HA, edema, dizziness / lightheadedness  BPH / Elevated PSA Previously with elevated PSA 10 to 11 then up to 17.9 (09/2017) followed by [facility], on Terazosin 5mg  daily. He has not returned to follow-up with Urology on PSA.  Additional concern - finger injury wound accidental - he was using  saw, and it kept rolling and cut hand/between fingers, bleeding but it stopped, and they are taking care of it by wound cleansing and keeping it wrapped, it is improved. He has no new concerns with it, denies redness drainage worsening pain, numbness  Denies any high risk travel to areas of current concern for COVID19. Denies any known or suspected exposure to person with or possibly with COVID19.  Denies any fevers, chills, sweats, body ache, cough, shortness of breath, sinus pain or pressure, headache, abdominal pain, diarrhea  Past Medical History:  Diagnosis Date  . Bowel obstruction ([place])   . BPH (benign prostatic hypertrophy)   . Gastric outlet obstruction   . High cholesterol   . Hyperchloremia   . Hypertension   . Pancreatitis    Social History   Tobacco Use  . Smoking status: Former Research scientist (life sciences)  . Smokeless tobacco: Former Network engineer Use Topics  . Alcohol use: No  . Drug use: No    Current Outpatient Medications:  .  aspirin EC 81 MG tablet, Take 81 mg by mouth daily., Disp: , Rfl:  .  atorvastatin (LIPITOR) 40 MG tablet, Take 1 tablet (40 mg total) by mouth daily at 6 PM., Disp: 90 tablet, Rfl: 3 .  clopidogrel (PLAVIX) 75 MG tablet, Take 1 tablet (75 mg total) by mouth daily., Disp: 90 tablet, Rfl: 3 .  lisinopril (PRINIVIL,ZESTRIL) 20 MG tablet, Take 1 tablet (20 mg total) by mouth daily., Disp: 90 tablet, Rfl: 3 .  terazosin (HYTRIN) 5 MG capsule, Take 1 capsule (5 mg total) by mouth at bedtime., Disp: 90 capsule, Rfl: 1 .  fluticasone (FLONASE) 50 MCG/ACT nasal spray, Place 2 sprays into both nostrils daily. Use for 4-6 weeks then stop and use seasonally or as needed. (Patient not taking: Reported on 08/11/2018), Disp: 48 g, Rfl: 1 .  promethazine (PHENERGAN) 12.5 MG tablet, Take 12.5 mg by mouth every 6 (six) hours as needed for nausea or vomiting., Disp: , Rfl:   Depression screen [facility] 2/9 03/02/2018 02/08/2018 10/12/2017  Decreased Interest 0 0 0  Down, Depressed,  Hopeless 0 0 0  PHQ - 2 Score 0 0 0    No flowsheet data found.  -------------------------------------------------------------------------- O: No physical exam performed due to remote telephone encounter.  Lab results reviewed.  Recent Results (from the past 2160 hour(s))  CBC with Differential/Platelet     Status: Abnormal   Collection Time: 08/07/18 10:19 PM  Result Value Ref Range   WBC 6.2 4.0 - 10.5 K/uL   RBC 3.98 (L) 4.22 - 5.81 MIL/uL   Hemoglobin 12.9 (L) 13.0 - 17.0 g/dL   HCT 39.4 39.0 - 52.0 %   MCV 99.0 80.0 - 100.0 fL   MCH 32.4 26.0 - 34.0 pg   MCHC 32.7 30.0 - 36.0 g/dL   RDW 13.1 11.5 - 15.5 %   Platelets 166 150 - 400 K/uL   nRBC 0.0 0.0 - 0.2 %   Neutrophils Relative % 50 %   Neutro Abs 3.1 1.7 - 7.7 K/uL   Lymphocytes Relative 35 %   Lymphs Abs 2.2 0.7 - 4.0 K/uL   Monocytes Relative 9 %   Monocytes Absolute 0.5 0.1 - 1.0 K/uL   Eosinophils Relative 5 %   Eosinophils Absolute 0.3 0.0 - 0.5 K/uL   Basophils Relative 1 %   Basophils Absolute 0.1 0.0 - 0.1 K/uL   Immature Granulocytes 0 %   Abs Immature Granulocytes 0.01 0.00 - 0.07 K/uL    Comment: Performed at [facility], [address] [place], [place] [address]  Basic metabolic panel     Status: Abnormal   Collection Time: 08/07/18 10:19 PM  Result Value Ref Range   Sodium 141 135 - 145 mmol/L   Potassium 4.1 3.5 - 5.1 mmol/L   Chloride 107 98 - 111 mmol/L   CO2 25 22 - 32 mmol/L   Glucose, Bld 125 (H) 70 - 99 mg/dL   BUN 20 8 - 23 mg/dL   Creatinine, Ser 0.98 0.61 - 1.24 mg/dL   Calcium 8.7 (L) 8.9 - 10.3 mg/dL   GFR calc non Af Amer >60 >60 mL/min   GFR calc Af Amer >60 >60 mL/min   Anion gap 9 5 - 15    Comment: Performed at [facility], [address] [place], [place] [address]  Troponin I - ONCE - STAT     Status: None   Collection Time: 08/07/18 10:19 PM  Result Value Ref Range   Troponin I <0.03 <0.03 ng/mL    Comment: Performed at [facility],  [address] [place], [place] [address]    -------------------------------------------------------------------------- A&P:  Problem List Items Addressed This Visit    BPH with obstruction/lower urinary tract symptoms   Relevant Medications   terazosin (HYTRIN) 5 MG capsule   Elevated PSA, between 10 and less than 20 ng/ml   Hypertension - Primary  Well-controlled HTN - Home BP readings normal  No known complications    Plan:  1. Continue current BP regimen Terazosin 5, lisinopril 20 2. Encourage improved lifestyle - low sodium diet, regular  exercise 3. Continue monitor BP outside office, bring readings to next visit, if persistently >140/90 or new symptoms notify office sooner 4. Follow-up    Relevant Medications   terazosin (HYTRIN) 5 MG capsule     Elevated PSA Advised him that he needs to return to [facility] to discuss further management and testing of PSA. It has almost been 1 year since last test. given contact for him to call Urologist  Meds ordered this encounter  Medications  . terazosin (HYTRIN) 5 MG capsule    Sig: Take 1 capsule (5 mg total) by mouth at bedtime.    Dispense:  90 capsule    Refill:  1    Follow-up: - Return in 6 months for HTN  Patient verbalizes understanding with the above medical recommendations including the limitation of remote medical advice.  Specific follow-up and call-back criteria were given for patient to follow-up or seek medical care more urgently if needed.  - Time spent in direct consultation with patient on phone: 11 minutes  Nobie Putnam, [facility] 08/11/2018, 8:58 AM

## 2018-08-18 ENCOUNTER — Ambulatory Visit: Payer: Self-pay | Admitting: Family Medicine

## 2018-09-06 ENCOUNTER — Other Ambulatory Visit: Payer: Medicare Other

## 2018-09-06 ENCOUNTER — Encounter: Payer: Medicare Other | Admitting: Family Medicine

## 2018-09-06 ENCOUNTER — Ambulatory Visit: Payer: Medicare Other

## 2018-09-19 ENCOUNTER — Other Ambulatory Visit: Payer: Self-pay | Admitting: Family Medicine

## 2018-09-19 DIAGNOSIS — I6930 Unspecified sequelae of cerebral infarction: Secondary | ICD-10-CM

## 2018-09-19 DIAGNOSIS — I10 Essential (primary) hypertension: Secondary | ICD-10-CM

## 2018-09-19 DIAGNOSIS — E782 Mixed hyperlipidemia: Secondary | ICD-10-CM

## 2018-10-18 ENCOUNTER — Ambulatory Visit (INDEPENDENT_AMBULATORY_CARE_PROVIDER_SITE_OTHER): Payer: Medicare Other | Admitting: Family Medicine

## 2018-10-18 ENCOUNTER — Other Ambulatory Visit: Payer: Self-pay

## 2018-10-18 ENCOUNTER — Encounter: Payer: Self-pay | Admitting: Family Medicine

## 2018-10-18 ENCOUNTER — Ambulatory Visit (INDEPENDENT_AMBULATORY_CARE_PROVIDER_SITE_OTHER): Payer: Medicare Other

## 2018-10-18 VITALS — BP 123/67 | HR 62 | Temp 98.2°F | Ht 71.0 in | Wt 226.0 lb

## 2018-10-18 VITALS — BP 123/67 | HR 62 | Temp 98.2°F | Resp 16 | Ht 71.0 in | Wt 226.0 lb

## 2018-10-18 DIAGNOSIS — K921 Melena: Secondary | ICD-10-CM

## 2018-10-18 DIAGNOSIS — N401 Enlarged prostate with lower urinary tract symptoms: Secondary | ICD-10-CM

## 2018-10-18 DIAGNOSIS — J3089 Other allergic rhinitis: Secondary | ICD-10-CM | POA: Diagnosis not present

## 2018-10-18 DIAGNOSIS — B880 Other acariasis: Secondary | ICD-10-CM

## 2018-10-18 DIAGNOSIS — I129 Hypertensive chronic kidney disease with stage 1 through stage 4 chronic kidney disease, or unspecified chronic kidney disease: Secondary | ICD-10-CM

## 2018-10-18 DIAGNOSIS — K5900 Constipation, unspecified: Secondary | ICD-10-CM | POA: Diagnosis not present

## 2018-10-18 DIAGNOSIS — N183 Chronic kidney disease, stage 3 unspecified: Secondary | ICD-10-CM

## 2018-10-18 DIAGNOSIS — N138 Other obstructive and reflux uropathy: Secondary | ICD-10-CM

## 2018-10-18 DIAGNOSIS — Z0000 Encounter for general adult medical examination without abnormal findings: Secondary | ICD-10-CM | POA: Diagnosis not present

## 2018-10-18 MED ORDER — TRIAMCINOLONE ACETONIDE 0.5 % EX CREA: 1.0000 "application " | TOPICAL_CREAM | Freq: Two times a day (BID) | CUTANEOUS | 1 refills | Status: DC

## 2018-10-18 MED ORDER — LORATADINE 10 MG PO TABS: 10.0000 mg | ORAL_TABLET | Freq: Every day | ORAL | 3 refills | Status: DC

## 2018-10-18 NOTE — Assessment & Plan Note (Signed)
Followed by [facility] Next apt 10/2018 On alpha blocker Elevated PSA will repeat and follow-up with Urology

## 2018-10-18 NOTE — Assessment & Plan Note (Signed)
Well-controlled HTN - Home BP readings controlled  Complication with CKD-III   Plan:  1. Continue current BP regimen 2. Encourage improved lifestyle - low sodium diet, regular exercise 3. Continue monitor BP outside office, bring readings to next visit, if persistently >140/90 or new symptoms notify office sooner 

## 2018-10-18 NOTE — Patient Instructions (Addendum)
Thank you for coming to the office today.  Stay tuned for apt for your problem with dark bowel movements, need to rule out any bleeding  [facility] [place], [place] [address] Phone: [phone]  Ordered generic loratadine claritin allergy pill to [facility]  Ordered stronger topical steroid cream for arms   DUE for FASTING BLOOD WORK (no food or drink after midnight before the lab appointment, only water or coffee without cream/sugar on the morning of)  SCHEDULE "Lab Only" visit in the morning at the clinic for lab draw in 6 MONTHS   - Make sure Lab Only appointment is at about 1 week before your next appointment, so that results will be available  For Lab Results, once available within 2-3 days of blood draw, you can can log in to MyChart online to view your results and a brief explanation. Also, we can discuss results at next follow-up visit.   Please schedule a Follow-up Appointment to: Return in about 6 months (around 04/19/2019) for 6 month follow-up lab results.  If you have any other questions or concerns, please feel free to call the office or send a message through [place]. You may also schedule an earlier appointment if necessary.  Additionally, you may be receiving a survey about your experience at our office within a few days to 1 week by e-mail or mail. We value your feedback.  Nobie Putnam, DO [place]

## 2018-10-18 NOTE — Progress Notes (Signed)
Subjective:   [name] is a 84 y.o. male who presents for Medicare Annual/Subsequent preventive examination.  Review of Systems:   Cardiac Risk Factors include: advanced age (>37men, >72 women);male gender;hypertension;dyslipidemia;obesity (BMI >30kg/m2)     Objective:    Vitals: BP 123/67 (BP Location: Left Arm, Patient Position: Sitting, Cuff Size: Normal)   Pulse 62   Temp 98.2 F (36.8 C) (Oral)   Ht 5\' 11"  (1.803 m)   Wt 226 lb (102.5 kg)   BMI 31.52 kg/m   Body mass index is 31.52 kg/m.  Advanced Directives 08/07/2018 03/28/2018 12/07/2017 09/08/2017 08/31/2017 12/02/2014 11/21/2014  Does Patient Have a Medical Advance Directive? No No Yes No No No No  Would patient like information on creating a medical advance directive? - - - No - Patient declined No - Patient declined No - patient declined information -    Tobacco Social History   Tobacco Use  Smoking Status Former Smoker  . Quit date: 31  . Years since quitting: 35.5  Smokeless Tobacco Former Engineer, structural given: Not Answered   Clinical Intake:  Pre-visit preparation completed: Yes  Pain : No/denies pain     Nutritional Status: BMI > 30  Obese Nutritional Risks: None Diabetes: No  How often do you need to have someone help you when you read instructions, pamphlets, or other written materials from your doctor or pharmacy?: 1 - Never  Interpreter Needed?: No  Information entered by ::  ,lPN  Past Medical History:  Diagnosis Date  . Bowel obstruction ([place])   . BPH (benign prostatic hypertrophy)   . Gastric outlet obstruction   . High cholesterol   . Hyperchloremia   . Hypertension   . Pancreatitis    Past Surgical History:  Procedure Laterality Date  . ESOPHAGOGASTRODUODENOSCOPY N/A 12/03/2014   Procedure: ESOPHAGOGASTRODUODENOSCOPY (EGD);  Surgeon: Hulen Luster, MD;  Location: [facility] ENDOSCOPY;  Service: Endoscopy;  Laterality: N/A;   Family History  Problem Relation Age of  Onset  . Heart disease Mother   . Heart disease Father   . Heart disease Brother    Social History   Socioeconomic History  . Marital status: Married    Spouse name: Not on file  . Number of children: Not on file  . Years of education: Not on file  . Highest education level: Not on file  Occupational History  . Occupation: retired  Scientific laboratory technician  . Financial resource strain: Not hard at all  . Food insecurity    Worry: Never true    Inability: Never true  . Transportation needs    Medical: No    Non-medical: No  Tobacco Use  . Smoking status: Former Smoker    Quit date: 1985    Years since quitting: 35.5  . Smokeless tobacco: Former Network engineer and Sexual Activity  . Alcohol use: No  . Drug use: No  . Sexual activity: Never  Lifestyle  . Physical activity    Days per week: 0 days    Minutes per session: 0 min  . Stress: Not at all  Relationships  . Social Herbalist on phone: Once a week    Gets together: More than three times a week    Attends religious service: More than 4 times per year    Active member of club or organization: No    Attends meetings of clubs or organizations: Never    Relationship status: Married  Other Topics Concern  . Not on file  Social History Narrative  . Not on file    Outpatient Encounter Medications as of 10/18/2018  Medication Sig  . aspirin EC 81 MG tablet Take 81 mg by mouth daily.  Marland Kitchen atorvastatin (LIPITOR) 40 MG tablet TAKE 1 TABLET DAILY AT 6 P.M.  . clopidogrel (PLAVIX) 75 MG tablet TAKE 1 TABLET DAILY  . lisinopril (ZESTRIL) 20 MG tablet TAKE 1 TABLET DAILY  . terazosin (HYTRIN) 5 MG capsule Take 1 capsule (5 mg total) by mouth at bedtime.  . fluticasone (FLONASE) 50 MCG/ACT nasal spray Place 2 sprays into both nostrils daily. Use for 4-6 weeks then stop and use seasonally or as needed. (Patient not taking: Reported on 10/18/2018)  . [DISCONTINUED] promethazine (PHENERGAN) 12.5 MG tablet Take 12.5 mg by mouth  every 6 (six) hours as needed for nausea or vomiting.   No facility-administered encounter medications on file as of 10/18/2018.     Activities of Daily Living In your present state of health, do you have any difficulty performing the following activities: 10/18/2018  Hearing? N  Vision? N  Difficulty concentrating or making decisions? Y  Walking or climbing stairs? N  Dressing or bathing? N  Doing errands, shopping? N  Preparing Food and eating ? N  Using the Toilet? N  In the past six months, have you accidently leaked urine? N  Do you have problems with loss of bowel control? Y  Comment since stroke in 2019  Managing your Medications? N  Managing your Finances? N  Housekeeping or managing your Housekeeping? N  Some recent data might be hidden    Patient Care Team: [name], DO as PCP - General (Family Medicine)   Assessment:   This is a routine wellness examination for [name].  Exercise Activities and Dietary recommendations Current Exercise Habits: The patient does not participate in regular exercise at present(states he gets dizzy after 5 minutes), Exercise limited by: None identified  Goals    . DIET - INCREASE WATER INTAKE     Recommend drinking at least 6-8 glasses of water a day        Fall Risk: Fall Risk  10/18/2018 03/02/2018 02/08/2018 10/12/2017 09/21/2017  Falls in the past year? 0 0 No No No  Comment - - - - -  Number falls in past yr: - - - - -  Injury with Fall? - - - - -  Follow up Falls evaluation completed Falls evaluation completed - - -    FALL RISK PREVENTION PERTAINING TO THE HOME:  Any stairs in or around the home? No  If so, are there any without handrails? n/a  Home free of loose throw rugs in walkways, pet beds, electrical cords, etc? Yes  Adequate lighting in your home to reduce risk of falls? Yes   ASSISTIVE DEVICES UTILIZED TO PREVENT FALLS:  Life alert? No  Use of a cane, walker or w/c? No  Grab bars in the  bathroom? No  Shower chair or bench in shower? No  Elevated toilet seat or a handicapped toilet? No   TIMED UP AND GO:  Was the test performed? Yes .  Length of time to ambulate 10 feet: 12 sec.   GAIT:  Appearance of gait: Gait steady and fast without the use of an assistive device.  Education: Fall risk prevention has been discussed.  Intervention(s) required? Yes  DME/home health order needed?  Yes   Depression Screen PHQ 2/9 Scores  10/18/2018 03/02/2018 02/08/2018 10/12/2017  PHQ - 2 Score 0 0 0 0    Cognitive Function     6CIT Screen 10/18/2018 10/12/2017 08/31/2017  What Year? 0 points 0 points 0 points  What month? 0 points 0 points 0 points  What time? 0 points 0 points 0 points  Count back from 20 0 points 0 points 0 points  Months in reverse 0 points 0 points 0 points  Repeat phrase 0 points 2 points 2 points  Total Score 0 2 2    Immunization History  Administered Date(s) Administered  . Influenza-Unspecified 12/20/2014  . Pneumococcal Polysaccharide-23 08/31/2017  . Tdap 09/08/2017    Qualifies for Shingles Vaccine? Yes  Zostavax completed n/a. Due for Shingrix. Education has been provided regarding the importance of this vaccine. Pt has been advised to call insurance company to determine out of pocket expense. Advised may also receive vaccine at local pharmacy or Health Dept. Verbalized acceptance and understanding.  Tdap: up to date   Flu Vaccine: up to date   Pneumococcal Vaccine: up to date  Screening Tests Health Maintenance  Topic Date Due  . INFLUENZA VACCINE  11/19/2018  . TETANUS/TDAP  09/09/2027  . PNA vac Low Risk Adult  Completed   Cancer Screenings:  Colorectal Screening: no longer required   Lung Cancer Screening: (Low Dose CT Chest recommended if Age 42-80 years, 30 pack-year currently smoking OR have quit w/in 15years.) does not qualify.    Additional Screening:  Hepatitis C Screening: does not qualify  Vision Screening:  Recommended annual ophthalmology exams for early detection of glaucoma and other disorders of the eye. Is the patient up to date with their annual eye exam?  No    Dental Screening: Recommended annual dental exams for proper oral hygiene  Community Resource Referral:  CRR required this visit?  No        Plan:  I have personally reviewed and addressed the Medicare Annual Wellness questionnaire and have noted the following in the patient's chart:  A. Medical and social history B. Use of alcohol, tobacco or illicit drugs  C. Current medications and supplements D. Functional ability and status E.  Nutritional status F.  Physical activity G. Advance directives H. List of other physicians I.  Hospitalizations, surgeries, and ER visits in previous 12 months J.  [place] such as hearing and vision if needed, cognitive and depression L. Referrals and appointments   In addition, I have reviewed and discussed with patient certain preventive protocols, quality metrics, and best practice recommendations. A written personalized care plan for preventive services as well as general preventive health recommendations were provided to patient.   Signed,   Bevelyn Ngo, LPN  [id number] Nurse Health Advisor   Nurse Notes:  None

## 2018-10-18 NOTE — Assessment & Plan Note (Signed)
Stable CKD-III WIll trend Cr baseline Improve hydration Control HTN

## 2018-10-18 NOTE — Progress Notes (Signed)
Subjective:    Patient ID: [name], male    DOB: 07/07/1934, 84 y.o.   MRN: [id number]  [name] is a 84 y.o. male presenting on 10/18/2018 for Dark stools and Hypertension  Patient has already been seen by Tyler Aas LPN today for his Annual [employer].  HPI   Melena / Dark Stools / Constipation Prior history in past year with constipation, seemed more functional, has taken miralax with some good results PRN. Now recently has less effective, still has difficulty with BMs at times. Worse at times with some darker stools and "sticky" description, not taking pepto bismol or iron or other trigger, has not seen any blood in stool or mixed in. Seems episodic dark stools only sometimes, in past 5-6 months, last colonoscopy >5 years ago. - Tried miralax, 17g one capful PRN, usually one dose only - did not repeat dose or take double Denies abdominal pain, early satiety, nausea vomiting  CHRONIC HTN w/ CKD III Home BP has improved Current Meds - Lisinopril 20mg  daily, Terazosin 5mg  daily   Reports good compliance, took meds today. Tolerating well, w/o complaints. Lifestyle: - Diet: balanced diet - Exercise: walking Denies CP, dyspnea, HA, edema, dizziness / lightheadedness  Chigger bites / bug bites on arms - reports itchy red spots from exposure, has tried OTC cortisone not enough relief.  Seasonal allergies - request rx for allergy medicine  BPH / Elevated PSA Previously with elevated PSA 10 to 11 then up to 17.9 (09/2017) followed by [facility], on Terazosin 5mg  daily. He has followed w/ Urology next scheduled in July 2020   Depression screen [facility] 2/9 10/18/2018 03/02/2018 02/08/2018  Decreased Interest 0 0 0  Down, Depressed, Hopeless 0 0 0  PHQ - 2 Score 0 0 0    Past Medical History:  Diagnosis Date  . Bowel obstruction ([place])   . BPH (benign prostatic hypertrophy)   . Gastric outlet obstruction   . High cholesterol   . Hyperchloremia   . Hypertension   .  Pancreatitis    Past Surgical History:  Procedure Laterality Date  . ESOPHAGOGASTRODUODENOSCOPY N/A 12/03/2014   Procedure: ESOPHAGOGASTRODUODENOSCOPY (EGD);  Surgeon: Hulen Luster, MD;  Location: [facility] ENDOSCOPY;  Service: Endoscopy;  Laterality: N/A;   Social History   Socioeconomic History  . Marital status: Married    Spouse name: Not on file  . Number of children: Not on file  . Years of education: Not on file  . Highest education level: Not on file  Occupational History  . Occupation: retired  Scientific laboratory technician  . Financial resource strain: Not hard at all  . Food insecurity    Worry: Never true    Inability: Never true  . Transportation needs    Medical: No    Non-medical: No  Tobacco Use  . Smoking status: Former Smoker    Quit date: 1985    Years since quitting: 35.5  . Smokeless tobacco: Former Network engineer and Sexual Activity  . Alcohol use: No  . Drug use: No  . Sexual activity: Never  Lifestyle  . Physical activity    Days per week: 0 days    Minutes per session: 0 min  . Stress: Not at all  Relationships  . Social connections    Talks on phone: Once a week    Gets together: More than three times a week    Attends religious service: More than 4 times per year  Active member of club or organization: No    Attends meetings of clubs or organizations: Never    Relationship status: Married  . Intimate partner violence    Fear of current or ex partner: No    Emotionally abused: No    Physically abused: No    Forced sexual activity: No  Other Topics Concern  . Not on file  Social History Narrative  . Not on file   Family History  Problem Relation Age of Onset  . Heart disease Mother   . Heart disease Father   . Heart disease Brother    Current Outpatient Medications on File Prior to Visit  Medication Sig  . aspirin EC 81 MG tablet Take 81 mg by mouth daily.  Marland Kitchen atorvastatin (LIPITOR) 40 MG tablet TAKE 1 TABLET DAILY AT 6 P.M.  . clopidogrel (PLAVIX)  75 MG tablet TAKE 1 TABLET DAILY  . fluticasone (FLONASE) 50 MCG/ACT nasal spray Place 2 sprays into both nostrils daily. Use for 4-6 weeks then stop and use seasonally or as needed. (Patient not taking: Reported on 10/18/2018)  . lisinopril (ZESTRIL) 20 MG tablet TAKE 1 TABLET DAILY  . terazosin (HYTRIN) 5 MG capsule Take 1 capsule (5 mg total) by mouth at bedtime.   No current facility-administered medications on file prior to visit.     Review of Systems  Constitutional: Negative for activity change, appetite change, chills, diaphoresis, fatigue and fever.  HENT: Negative for congestion and hearing loss.   Eyes: Negative for visual disturbance.  Respiratory: Negative for cough, chest tightness, shortness of breath and wheezing.   Cardiovascular: Negative for chest pain, palpitations and leg swelling.  Gastrointestinal: Positive for constipation. Negative for abdominal pain, diarrhea, nausea and vomiting.       Dark stools  Genitourinary: Negative for dysuria, frequency and hematuria.  Musculoskeletal: Negative for arthralgias and neck pain.  Skin: Negative for rash.  Neurological: Negative for dizziness, weakness, light-headedness, numbness and headaches.  Hematological: Negative for adenopathy.  Psychiatric/Behavioral: Negative for behavioral problems, dysphoric mood and sleep disturbance.   Per HPI unless specifically indicated above      Objective:    BP 123/67   Pulse 62   Temp 98.2 F (36.8 C) (Oral)   Resp 16   Ht 5\' 11"  (1.803 m)   Wt 226 lb (102.5 kg)   BMI 31.52 kg/m   Wt Readings from Last 3 Encounters:  10/18/18 226 lb (102.5 kg)  10/18/18 226 lb (102.5 kg)  08/07/18 217 lb (98.4 kg)    Physical Exam Vitals signs and nursing note reviewed.  Constitutional:      General: He is not in acute distress.    Appearance: He is well-developed. He is not diaphoretic.     Comments: Well-appearing, comfortable, cooperative  HENT:     Head: Normocephalic and  atraumatic.  Eyes:     General:        Right eye: No discharge.        Left eye: No discharge.     Conjunctiva/sclera: Conjunctivae normal.     Pupils: Pupils are equal, round, and reactive to light.  Neck:     Musculoskeletal: Normal range of motion and neck supple.     Thyroid: No thyromegaly.  Cardiovascular:     Rate and Rhythm: Normal rate and regular rhythm.     Heart sounds: Normal heart sounds. No murmur.  Pulmonary:     Effort: Pulmonary effort is normal. No respiratory distress.  Breath sounds: Normal breath sounds. No wheezing or rales.  Abdominal:     General: Bowel sounds are normal. There is no distension.     Palpations: Abdomen is soft. There is no mass.     Tenderness: There is no abdominal tenderness.  Musculoskeletal: Normal range of motion.        General: No tenderness.     Comments: Upper / Lower Extremities: - Normal muscle tone, strength bilateral upper extremities 5/5, lower extremities 5/5  Lymphadenopathy:     Cervical: No cervical adenopathy.  Skin:    General: Skin is warm and dry.     Findings: Rash (left forearm raised mild erythematous pruritic rash) present. No erythema.  Neurological:     Mental Status: He is alert and oriented to person, place, and time.     Comments: Distal sensation intact to light touch all extremities  Psychiatric:        Behavior: Behavior normal.     Comments: Well groomed, good eye contact, normal speech and thoughts       Results for orders placed or performed during the hospital encounter of 08/07/18  CBC with Differential/Platelet  Result Value Ref Range   WBC 6.2 4.0 - 10.5 K/uL   RBC 3.98 (L) 4.22 - 5.81 MIL/uL   Hemoglobin 12.9 (L) 13.0 - 17.0 g/dL   HCT 39.4 39.0 - 52.0 %   MCV 99.0 80.0 - 100.0 fL   MCH 32.4 26.0 - 34.0 pg   MCHC 32.7 30.0 - 36.0 g/dL   RDW 13.1 11.5 - 15.5 %   Platelets 166 150 - 400 K/uL   nRBC 0.0 0.0 - 0.2 %   Neutrophils Relative % 50 %   Neutro Abs 3.1 1.7 - 7.7 K/uL    Lymphocytes Relative 35 %   Lymphs Abs 2.2 0.7 - 4.0 K/uL   Monocytes Relative 9 %   Monocytes Absolute 0.5 0.1 - 1.0 K/uL   Eosinophils Relative 5 %   Eosinophils Absolute 0.3 0.0 - 0.5 K/uL   Basophils Relative 1 %   Basophils Absolute 0.1 0.0 - 0.1 K/uL   Immature Granulocytes 0 %   Abs Immature Granulocytes 0.01 0.00 - 0.07 K/uL  Basic metabolic panel  Result Value Ref Range   Sodium 141 135 - 145 mmol/L   Potassium 4.1 3.5 - 5.1 mmol/L   Chloride 107 98 - 111 mmol/L   CO2 25 22 - 32 mmol/L   Glucose, Bld 125 (H) 70 - 99 mg/dL   BUN 20 8 - 23 mg/dL   Creatinine, Ser 0.98 0.61 - 1.24 mg/dL   Calcium 8.7 (L) 8.9 - 10.3 mg/dL   GFR calc non Af Amer >60 >60 mL/min   GFR calc Af Amer >60 >60 mL/min   Anion gap 9 5 - 15  Troponin I - ONCE - STAT  Result Value Ref Range   Troponin I <0.03 <0.03 ng/mL      Assessment & Plan:   Problem List Items Addressed This Visit    Benign hypertension with CKD (chronic kidney disease) stage III (HCC)    Well-controlled HTN - Home BP readings controlled  Complication with CKD-III   Plan:  1. Continue current BP regimen 2. Encourage improved lifestyle - low sodium diet, regular exercise 3. Continue monitor BP outside office, bring readings to next visit, if persistently >140/90 or new symptoms notify office sooner      BPH with obstruction/lower urinary tract symptoms    Followed by  [facility] Next apt 10/2018 On alpha blocker Elevated PSA will repeat and follow-up with Urology      CKD (chronic kidney disease), stage III ([place]) - Primary    Stable CKD-III WIll trend Cr baseline Improve hydration Control HTN       Other Visit Diagnoses    Constipation, unspecified constipation type       Relevant Orders   Ambulatory referral to Gastroenterology   Chigger bites       Relevant Medications   triamcinolone cream (KENALOG) 0.5 %   Seasonal allergic rhinitis due to other allergic trigger       Relevant Medications   loratadine  (CLARITIN) 10 MG tablet   Melena       Relevant Orders   Ambulatory referral to Gastroenterology      Referral to [facility] for consultation for recent worsening >4-6 months with episodic melena dark black sticky stools, no obvious sign of bleeding, no other GI red flag symptoms at this time no other concerns or symptoms, no recent colonoscopy on file, last reported > 5 years    Orders Placed This Encounter  Procedures  . Ambulatory referral to Gastroenterology    Referral Priority:   Routine    Referral Type:   Consultation    Referral Reason:   Specialty Services Required    Number of Visits Requested:   1     Meds ordered this encounter  Medications  . triamcinolone cream (KENALOG) 0.5 %    Sig: Apply 1 application topically 2 (two) times daily. To affected areas, for up to 2 weeks.    Dispense:  30 g    Refill:  1  . loratadine (CLARITIN) 10 MG tablet    Sig: Take 1 tablet (10 mg total) by mouth daily. Use for 4-6 weeks then stop, and use as needed or seasonally    Dispense:  90 tablet    Refill:  3     Follow up plan: Return in about 6 months (around 04/19/2019) for 6 month follow-up lab results.   Re order labs 6 months for 03/2019  Nobie Putnam, [facility] 10/18/2018, 11:46 AM

## 2018-10-18 NOTE — Patient Instructions (Addendum)
[name] , Thank you for taking time to come for your Medicare Wellness Visit. I appreciate your ongoing commitment to your health goals. Please review the following plan we discussed and let me know if I can assist you in the future.   Screening recommendations/referrals: Colorectal Screening: no longer required   Vision and Dental Exams:  Recommended annual ophthalmology exams for early detection of glaucoma and other disorders of the eye Recommended annual dental exams for proper oral hygiene  Vaccinations: Influenza vaccine: up to date  Pneumococcal vaccine: up to date  Tdap vaccine: up to date  Shingles vaccine: Please call your insurance company to determine your out of pocket expense for the Shingrix vaccine. You may receive this vaccine at your local pharmacy.  Advanced directives: Advance directives discussed with you today. I have provided a copy for you to complete at home and have notarized. Once this is complete please bring a copy in to our office so we can scan it into your chart.  Goals: Recommend to drink at least 6-8 8oz glasses of water per day.  Next appointment: Please schedule your Annual Wellness Visit with your Nurse Health Advisor in one year.  Preventive Care 82 Years and Older, Male Preventive care refers to lifestyle choices and visits with your health care provider that can promote health and wellness. What does preventive care include?  A yearly physical exam. This is also called an annual well check.  Dental exams once or twice a year.  Routine eye exams. Ask your health care provider how often you should have your eyes checked.  Personal lifestyle choices, including:  Daily care of your teeth and gums.  Regular physical activity.  Eating a healthy diet.  Avoiding tobacco and drug use.  Limiting alcohol use.  Practicing safe sex.  Taking low doses of aspirin every day if recommended by your health care provider..  Taking vitamin and  mineral supplements as recommended by your health care provider. What happens during an annual well check? The services and screenings done by your health care provider during your annual well check will depend on your age, overall health, lifestyle risk factors, and family history of disease. Counseling  Your health care provider may ask you questions about your:  Alcohol use.  Tobacco use.  Drug use.  Emotional well-being.  Home and relationship well-being.  Sexual activity.  Eating habits.  History of falls.  Memory and ability to understand (cognition).  Work and work Statistician. Screening  You may have the following tests or measurements:  Height, weight, and BMI.  Blood pressure.  Lipid and cholesterol levels. These may be checked every 5 years, or more frequently if you are over 20 years old.  Skin check.  Lung cancer screening. You may have this screening every year starting at age 67 if you have a 30-pack-year history of smoking and currently smoke or have quit within the past 15 years.  Fecal occult blood test (FOBT) of the stool. You may have this test every year starting at age 38.  Flexible sigmoidoscopy or colonoscopy. You may have a sigmoidoscopy every 5 years or a colonoscopy every 10 years starting at age 66.  Prostate cancer screening. Recommendations will vary depending on your family history and other risks.  Hepatitis C blood test.  Hepatitis B blood test.  Sexually transmitted disease (STD) testing.  Diabetes screening. This is done by checking your blood sugar (glucose) after you have not eaten for a while (fasting). You may  have this done every 1-3 years.  Abdominal aortic aneurysm (AAA) screening. You may need this if you are a current or former smoker.  Osteoporosis. You may be screened starting at age 30 if you are at high risk. Talk with your health care provider about your test results, treatment options, and if necessary, the need  for more tests. Vaccines  Your health care provider may recommend certain vaccines, such as:  Influenza vaccine. This is recommended every year.  Tetanus, diphtheria, and acellular pertussis (Tdap, Td) vaccine. You may need a Td booster every 10 years.  Zoster vaccine. You may need this after age 84.  Pneumococcal 13-valent conjugate (PCV13) vaccine. One dose is recommended after age 30.  Pneumococcal polysaccharide (PPSV23) vaccine. One dose is recommended after age 51. Talk to your health care provider about which screenings and vaccines you need and how often you need them. This information is not intended to replace advice given to you by your health care provider. Make sure you discuss any questions you have with your health care provider. Document Released: 05/03/2015 Document Revised: 12/25/2015 Document Reviewed: 02/05/2015 Elsevier Interactive Patient Education  2017 [employer] Prevention in the Home Falls can cause injuries. They can happen to people of all ages. There are many things you can do to make your home safe and to help prevent falls. What can I do on the outside of my home?  Regularly fix the edges of walkways and driveways and fix any cracks.  Remove anything that might make you trip as you walk through a door, such as a raised step or threshold.  Trim any bushes or trees on the path to your home.  Use bright outdoor lighting.  Clear any walking paths of anything that might make someone trip, such as rocks or tools.  Regularly check to see if handrails are loose or broken. Make sure that both sides of any steps have handrails.  Any raised decks and porches should have guardrails on the edges.  Have any leaves, snow, or ice cleared regularly.  Use sand or salt on walking paths during winter.  Clean up any spills in your garage right away. This includes oil or grease spills. What can I do in the bathroom?  Use night lights.  Install grab bars  by the toilet and in the tub and shower. Do not use towel bars as grab bars.  Use non-skid mats or decals in the tub or shower.  If you need to sit down in the shower, use a plastic, non-slip stool.  Keep the floor dry. Clean up any water that spills on the floor as soon as it happens.  Remove soap buildup in the tub or shower regularly.  Attach bath mats securely with double-sided non-slip rug tape.  Do not have throw rugs and other things on the floor that can make you trip. What can I do in the bedroom?  Use night lights.  Make sure that you have a light by your bed that is easy to reach.  Do not use any sheets or blankets that are too big for your bed. They should not hang down onto the floor.  Have a firm chair that has side arms. You can use this for support while you get dressed.  Do not have throw rugs and other things on the floor that can make you trip. What can I do in the kitchen?  Clean up any spills right away.  Avoid walking on wet  floors.  Keep items that you use a lot in easy-to-reach places.  If you need to reach something above you, use a strong step stool that has a grab bar.  Keep electrical cords out of the way.  Do not use floor polish or wax that makes floors slippery. If you must use wax, use non-skid floor wax.  Do not have throw rugs and other things on the floor that can make you trip. What can I do with my stairs?  Do not leave any items on the stairs.  Make sure that there are handrails on both sides of the stairs and use them. Fix handrails that are broken or loose. Make sure that handrails are as long as the stairways.  Check any carpeting to make sure that it is firmly attached to the stairs. Fix any carpet that is loose or worn.  Avoid having throw rugs at the top or bottom of the stairs. If you do have throw rugs, attach them to the floor with carpet tape.  Make sure that you have a light switch at the top of the stairs and the  bottom of the stairs. If you do not have them, ask someone to add them for you. What else can I do to help prevent falls?  Wear shoes that:  Do not have high heels.  Have rubber bottoms.  Are comfortable and fit you well.  Are closed at the toe. Do not wear sandals.  If you use a stepladder:  Make sure that it is fully opened. Do not climb a closed stepladder.  Make sure that both sides of the stepladder are locked into place.  Ask someone to hold it for you, if possible.  Clearly mark and make sure that you can see:  Any grab bars or handrails.  First and last steps.  Where the edge of each step is.  Use tools that help you move around (mobility aids) if they are needed. These include:  Canes.  Walkers.  Scooters.  Crutches.  Turn on the lights when you go into a dark area. Replace any light bulbs as soon as they burn out.  Set up your furniture so you have a clear path. Avoid moving your furniture around.  If any of your floors are uneven, fix them.  If there are any pets around you, be aware of where they are.  Review your medicines with your doctor. Some medicines can make you feel dizzy. This can increase your chance of falling. Ask your doctor what other things that you can do to help prevent falls. This information is not intended to replace advice given to you by your health care provider. Make sure you discuss any questions you have with your health care provider. Document Released: 01/31/2009 Document Revised: 09/12/2015 Document Reviewed: 05/11/2014 Elsevier Interactive Patient Education  2017 [employer].

## 2018-10-19 ENCOUNTER — Encounter: Payer: Self-pay | Admitting: *Deleted

## 2018-11-07 ENCOUNTER — Other Ambulatory Visit: Payer: Self-pay | Admitting: Family Medicine

## 2018-11-07 DIAGNOSIS — R972 Elevated prostate specific antigen [PSA]: Secondary | ICD-10-CM

## 2018-11-08 ENCOUNTER — Other Ambulatory Visit: Payer: Medicare Other

## 2018-11-08 ENCOUNTER — Other Ambulatory Visit: Payer: Self-pay

## 2018-11-08 DIAGNOSIS — R972 Elevated prostate specific antigen [PSA]: Secondary | ICD-10-CM

## 2018-11-09 LAB — PSA: Prostate Specific Ag, Serum: 17.4 ng/mL — ABNORMAL HIGH (ref 0.0–4.0)

## 2018-11-14 NOTE — Progress Notes (Signed)
11/15/2018 3:12 PM   [name] [birth year] [id number]  Referring provider: [name], DO [address] [place],  [place] [address]  Chief Complaint  Patient presents with  . Elevated PSA    HPI: [name] is an 84 -year-old male presents to urology today for further evaluation and management of an elevated PSA.  He was last seen in our office with Dr. Pilar Jarvis in 2018 for a similar issue.   The patient was noted to have an elevated PSA as part of his routine screening. The patient has no significant family history of prostate cancer.  At his last appointment, it was being followed for stability in lieu of a biopsy.  PSA history:  9/12: 2.34 4/18: 11.8   6/18: 10.6  03/2017: 11.1 09/2017: 17.9 10/2018: 17.4  BPH WITH LUTS  (prostate and/or bladder) IPSS score: 20/2   PVR: 21 mL       Major complaint(s):  No complaints at this visit.  Denies any dysuria, hematuria or suprapubic pain.   Denies any recent fevers, chills, nausea or vomiting.  IPSS    Row Name 11/15/18 0900         International Prostate Symptom Score   How often have you had the sensation of not emptying your bladder?  About half the time     How often have you had to urinate less than every two hours?  Less than 1 in 5 times     How often have you found you stopped and started again several times when you urinated?  Almost always     How often have you found it difficult to postpone urination?  More than half the time     How often have you had a weak urinary stream?  About half the time     How often have you had to strain to start urination?  About half the time     How many times did you typically get up at night to urinate?  1 Time     Total IPSS Score  20       Quality of Life due to urinary symptoms   If you were to spend the rest of your life with your urinary condition just the way it is now how would you feel about that?  Mostly Satisfied        Score:  1-7 Mild 8-19  Moderate 20-35 Severe    PMH: Past Medical History:  Diagnosis Date  . Bowel obstruction ([place])   . BPH (benign prostatic hypertrophy)   . Gastric outlet obstruction   . High cholesterol   . Hyperchloremia   . Hypertension   . Pancreatitis     Surgical History: Past Surgical History:  Procedure Laterality Date  . ESOPHAGOGASTRODUODENOSCOPY N/A 12/03/2014   Procedure: ESOPHAGOGASTRODUODENOSCOPY (EGD);  Surgeon: Hulen Luster, MD;  Location: [facility] ENDOSCOPY;  Service: Endoscopy;  Laterality: N/A;    Home Medications:  Allergies as of 11/15/2018   No Known Allergies     Medication List       Accurate as of November 15, 2018  3:12 PM. If you have any questions, ask your nurse or doctor.        aspirin EC 81 MG tablet Take 81 mg by mouth daily.   atorvastatin 40 MG tablet Commonly known as: LIPITOR TAKE 1 TABLET DAILY AT 6 P.M.   clopidogrel 75 MG tablet Commonly known as: PLAVIX TAKE 1 TABLET DAILY   fluticasone  50 MCG/ACT nasal spray Commonly known as: FLONASE Place 2 sprays into both nostrils daily. Use for 4-6 weeks then stop and use seasonally or as needed.   lisinopril 20 MG tablet Commonly known as: ZESTRIL TAKE 1 TABLET DAILY   loratadine 10 MG tablet Commonly known as: CLARITIN Take 1 tablet (10 mg total) by mouth daily. Use for 4-6 weeks then stop, and use as needed or seasonally   terazosin 5 MG capsule Commonly known as: HYTRIN Take 1 capsule (5 mg total) by mouth at bedtime.   triamcinolone cream 0.5 % Commonly known as: KENALOG Apply 1 application topically 2 (two) times daily. To affected areas, for up to 2 weeks.       Allergies: No Known Allergies  Family History: Family History  Problem Relation Age of Onset  . Heart disease Mother   . Heart disease Father   . Heart disease Brother     Social History:  reports that he quit smoking about 35 years ago. He has quit using smokeless tobacco. He reports that he does not drink alcohol or use  drugs.  ROS: UROLOGY Frequent Urination?: No Hard to postpone urination?: No Burning/pain with urination?: No Get up at night to urinate?: No Leakage of urine?: No Urine stream starts and stops?: No Trouble starting stream?: No Do you have to strain to urinate?: No Blood in urine?: No Urinary tract infection?: No Sexually transmitted disease?: No Injury to kidneys or bladder?: No Painful intercourse?: No Weak stream?: No Erection problems?: No Penile pain?: No  Gastrointestinal Nausea?: No Vomiting?: No Indigestion/heartburn?: No Diarrhea?: No Constipation?: No  Constitutional Fever: No Night sweats?: No Weight loss?: No Fatigue?: Yes  Skin Skin rash/lesions?: No Itching?: No  Eyes Blurred vision?: No Double vision?: No  Ears/Nose/Throat Sore throat?: No Sinus problems?: No  Hematologic/Lymphatic Swollen glands?: No Easy bruising?: No  Cardiovascular Leg swelling?: Yes Chest pain?: No  Respiratory Cough?: No Shortness of breath?: No  Endocrine Excessive thirst?: No  Musculoskeletal Back pain?: No Joint pain?: No  Neurological Headaches?: No Dizziness?: No  Psychologic Depression?: No Anxiety?: No  Physical Exam: BP 125/73 (BP Location: Left Arm, Patient Position: Sitting, Cuff Size: Normal)   Pulse 63   Ht 5\' 11"  (1.803 m)   Wt 215 lb (97.5 kg)   BMI 29.99 kg/m   Constitutional:  Well nourished. Alert and oriented, No acute distress. HEENT: [place] AT, moist mucus membranes.  Trachea midline, no masses. Cardiovascular: No clubbing, cyanosis, or edema. Respiratory: Normal respiratory effort, no increased work of breathing. GI: Abdomen is soft, non tender, non distended, no abdominal masses. Liver and spleen not palpable.  No hernias appreciated.  Stool sample for occult testing is not indicated.   GU: No CVA tenderness.  No bladder fullness or masses.  Patient with uncircumcised phallus.  Foreskin easily retracted  Urethral meatus is  patent.  No penile discharge. No penile lesions or rashes. Scrotum without lesions, cysts, rashes and/or edema.  Testicles are located scrotally bilaterally. No masses are appreciated in the testicles. Left and right epididymis are normal. Rectal: Patient with  normal sphincter tone. Anus and perineum without scarring or rashes. No rectal masses are appreciated. Prostate is approximately 50 grams, irregular, no nodules are appreciated. Seminal vesicles could not be palpated Skin: No rashes, bruises or suspicious lesions. Lymph: No inguinal adenopathy. Neurologic: Grossly intact, no focal deficits, moving all 4 extremities. Psychiatric: Normal mood and affect.   Laboratory Data: Lab Results  Component Value Date  WBC 6.2 08/07/2018   HGB 12.9 (L) 08/07/2018   HCT 39.4 08/07/2018   MCV 99.0 08/07/2018   PLT 166 08/07/2018    Lab Results  Component Value Date   CREATININE 0.98 08/07/2018    No results found for: PSA  No results found for: TESTOSTERONE  Lab Results  Component Value Date   HGBA1C 5.4 09/09/2017    Urinalysis    Component Value Date/Time   COLORURINE YELLOW (A) 09/18/2017 1255   APPEARANCEUR CLEAR (A) 09/18/2017 1255   APPEARANCEUR Clear 10/06/2016 1534   LABSPEC 1.015 09/18/2017 1255   LABSPEC >=1.030 07/18/2013 0931   PHURINE 6.0 09/18/2017 1255   GLUCOSEU 50 (A) 09/18/2017 1255   GLUCOSEU NEGATIVE 07/18/2013 0931   HGBUR NEGATIVE 09/18/2017 1255   BILIRUBINUR NEGATIVE 09/18/2017 1255   BILIRUBINUR Negative 10/06/2016 1534   BILIRUBINUR 1+ 07/18/2013 [place] 09/18/2017 [place] 09/18/2017 1255   NITRITE NEGATIVE 09/18/2017 1255   LEUKOCYTESUR NEGATIVE 09/18/2017 1255   LEUKOCYTESUR Negative 10/06/2016 1534   LEUKOCYTESUR NEGATIVE 07/18/2013 0931    Assessment & Plan:    1.  Elevated PSA/Abnormal exam I discussed with the patient that PSA is an acronym for  prostate specific antigen,  which is a protein made by  the prostate gland and can be detected in the blood stream. I explained to the patient situations that would increase the PSA, such as: a man's age,  BPH, infection, recent intercourse/ejaculation, prostate infarction, recent urethroscopic manipulation (Foley placement/cystoscopy) and prostate cancer.  At this time, it is likely he has prostate cancer.  Due to his age and co-morbdies it would be appropriate to continue with observation (only treating for palliative reasons, urinary retention/difficulty, pain and/or pathological fractures).  If he desires, he may choose to undergo a prostate MRI for further evaluation into his PSA elevation.  I explained that this would look for areas in the prostate that would be of high concern for an aggressive prostate cancer He would like to have a prostate MRI at this time for further evaluation of the elevated PSA RTC to discuss prostate MRI report   2. BPH with LUTS IPSS score is 20/2 Continue conservative management, avoiding bladder irritants and timed voiding's RTC to discuss prostate MRI report   Return for prostate mri report .     [name], PA-C  [facility] [address] [place], [place] [address] [phone]

## 2018-11-15 ENCOUNTER — Encounter: Payer: Self-pay | Admitting: Urology

## 2018-11-15 ENCOUNTER — Other Ambulatory Visit: Payer: Self-pay

## 2018-11-15 ENCOUNTER — Ambulatory Visit (INDEPENDENT_AMBULATORY_CARE_PROVIDER_SITE_OTHER): Payer: Medicare Other | Admitting: Urology

## 2018-11-15 VITALS — BP 125/73 | HR 63 | Ht 71.0 in | Wt 215.0 lb

## 2018-11-15 DIAGNOSIS — N401 Enlarged prostate with lower urinary tract symptoms: Secondary | ICD-10-CM | POA: Diagnosis not present

## 2018-11-15 DIAGNOSIS — R3989 Other symptoms and signs involving the genitourinary system: Secondary | ICD-10-CM | POA: Diagnosis not present

## 2018-11-15 DIAGNOSIS — N138 Other obstructive and reflux uropathy: Secondary | ICD-10-CM | POA: Diagnosis not present

## 2018-11-15 DIAGNOSIS — R972 Elevated prostate specific antigen [PSA]: Secondary | ICD-10-CM

## 2018-11-15 LAB — BLADDER SCAN AMB NON-IMAGING: Scan Result: 21

## 2018-11-18 ENCOUNTER — Ambulatory Visit (INDEPENDENT_AMBULATORY_CARE_PROVIDER_SITE_OTHER): Payer: Medicare Other | Admitting: Gastroenterology

## 2018-11-18 ENCOUNTER — Encounter: Payer: Self-pay | Admitting: Gastroenterology

## 2018-11-18 ENCOUNTER — Other Ambulatory Visit: Payer: Self-pay

## 2018-11-18 VITALS — BP 146/70 | HR 66 | Temp 97.9°F | Ht 71.0 in | Wt 224.8 lb

## 2018-11-18 DIAGNOSIS — B9681 Helicobacter pylori [H. pylori] as the cause of diseases classified elsewhere: Secondary | ICD-10-CM | POA: Diagnosis not present

## 2018-11-18 DIAGNOSIS — K2970 Gastritis, unspecified, without bleeding: Secondary | ICD-10-CM

## 2018-11-18 DIAGNOSIS — K921 Melena: Secondary | ICD-10-CM

## 2018-11-18 DIAGNOSIS — D649 Anemia, unspecified: Secondary | ICD-10-CM | POA: Diagnosis not present

## 2018-11-18 MED ORDER — OMEPRAZOLE 40 MG PO CPDR: 40.0000 mg | DELAYED_RELEASE_CAPSULE | Freq: Every day | ORAL | 1 refills | Status: DC

## 2018-11-18 NOTE — Progress Notes (Signed)
Cephas Darby, MD [address]  [place]  [place], [place] [address]  Main: [phone]  Fax: [phone]    Gastroenterology Consultation  Referring Provider:     Nobie Putnam * Primary Care Physician:  [name], DO Primary Gastroenterologist:  Dr. Cephas Darby Reason for Consultation:     Intermittent melena, anemia        HPI:   [name] is a 84 y.o. male referred by Dr. Parks Ranger, Devonne Doughty, DO  for consultation & management of intermittent melena, anemia Patient has history of stroke occurred in 06/2017, has been on aspirin and Plavix since then.  He does have history of gastric ulcers, pyloric stenosis that was dilated in 2016 by Dr. Verdie Shire, found to have H. pylori gastritis and he was treated with triple therapy at that time, unclear if infection eradication was confirmed.  He did not see him since then.  He is not on any acid suppressive therapy at present.  Patient denies abdominal pain, nausea or vomiting.  He does notice intermittent black stools sometimes once a week or less.  He denies generalized weakness, lethargy, rectal bleeding.  He does not know when he had a colonoscopy He has some residual memory loss since stroke He is functionally independent, active otherwise  NSAIDs: None  Antiplts/Anticoagulants/Anti thrombotics: Aspirin and Plavix given his history of stroke  GI Procedures: EGD in 2016 - Normal esophagus. - Gastric ulcers oozing blood. - Gastric ulcer with clean base. Biopsied. - Normal examined duodenum. 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  DIAGNOSIS:  A. STOMACH; COLD BIOPSY:  - MODERATE CHRONIC GASTRITIS WITH MILD TO MODERATE ACTIVITY,  HELICOBACTER PYLORI-ASSOCIATED.  - NEGATIVE FOR DYSPLASIA AND MALIGNANCY.    Past Medical History:  Diagnosis Date  . Bowel obstruction ([place])   . BPH (benign prostatic hypertrophy)   . Gastric outlet obstruction   . High cholesterol   . Hyperchloremia    . Hypertension   . Pancreatitis     Past Surgical History:  Procedure Laterality Date  . ESOPHAGOGASTRODUODENOSCOPY N/A 12/03/2014   Procedure: ESOPHAGOGASTRODUODENOSCOPY (EGD);  Surgeon: Hulen Luster, MD;  [facility] END[facility]COPY;  Service: Endoscopy;  Laterality: N/A;    Current Outpatient Medications:  .  aspirin EC 81 MG tablet, Take 81 mg by mouth daily., Disp: , Rfl:  .  atorvastatin (LIPITOR) 40 MG tablet, TAKE 1 TABLET DAILY AT 6 P.M., Disp: 90 tablet, Rfl: 3 .  clopidogrel (PLAVIX) 75 MG tablet, TAKE 1 TABLET DAILY, Disp: 90 tablet, Rfl: 3 .  lisinopril (ZESTRIL) 20 MG tablet, TAKE 1 TABLET DAILY, Disp: 90 tablet, Rfl: 3 .  loratadine (CLARITIN) 10 MG tablet, Take 1 tablet (10 mg total) by mouth daily. Use for 4-6 weeks then stop, and use as needed or seasonally, Disp: 90 tablet, Rfl: 3 .  terazosin (HYTRIN) 5 MG capsule, Take 1 capsule (5 mg total) by mouth at bedtime., Disp: 90 capsule, Rfl: 1 .  triamcinolone cream (KENALOG) 0.5 %, Apply 1 application topically 2 (two) times daily. To affected areas, for up to 2 weeks., Disp: 30 g, Rfl: 1 .  fluticasone (FLONASE) 50 MCG/ACT nasal spray, Place 2 sprays into both nostrils daily. Use for 4-6 weeks then stop and use seasonally or as needed. (Patient not taking: Reported on 10/18/2018), Disp: 48 g, Rfl: 1 .  omeprazole (PRILOSEC) 40 MG capsule, Take 1 capsule (40 mg total) by mouth daily., Disp: 90 capsule, Rfl: 1    Family  History  Problem Relation Age of Onset  . Heart disease Mother   . Heart disease Father   . Heart disease Brother      Social History   Tobacco Use  . Smoking status: Former Smoker    Quit date: 1985    Years since quitting: 35.6  . Smokeless tobacco: Former Network engineer Use Topics  . Alcohol use: No  . Drug use: No    Allergies as of 11/18/2018  . (No Known Allergies)    Review of Systems:    All systems reviewed and negative except where noted in HPI.   Physical Exam:  BP (!) 146/70    Pulse 66   Temp 97.9 F (36.6 C) (Oral)   Ht 5\' 11"  (1.803 m)   Wt 224 lb 12.8 oz (102 kg)   BMI 31.35 kg/m  No LMP for male patient.  General:   Alert,  Well-developed, well-nourished, pleasant and cooperative in NAD Head:  Normocephalic and atraumatic. Eyes:  Sclera clear, no icterus.   Conjunctiva pink. Ears:  Normal auditory acuity. Nose:  No deformity, discharge, or lesions. Mouth:  No deformity or lesions,oropharynx pink & moist. Neck:  Supple; no masses or thyromegaly. Lungs:  Respirations even and unlabored.  Clear throughout to auscultation.   No wheezes, crackles, or rhonchi. No acute distress. Heart:  Regular rate and rhythm; no murmurs, clicks, rubs, or gallops. Abdomen:  Normal bowel sounds. Soft, non-tender and non-distended without masses, hepatosplenomegaly or hernias noted.  No guarding or rebound tenderness.   Rectal: Not performed Msk:  Symmetrical without gross deformities. Good, equal movement & strength bilaterally. Pulses:  Normal pulses noted. Extremities:  No clubbing or edema.  No cyanosis. Neurologic:  Alert and oriented x3;  grossly normal neurologically. Skin:  Intact without significant lesions or rashes. No jaundice. Lymph Nodes:  No significant cervical adenopathy. Psych:  Alert and cooperative. Normal mood and affect.  Imaging Studies: Reviewed  Assessment and Plan:   [name] is a 84 y.o. male with obesity, hypertension, BPH, history of stroke on aspirin and Plavix, known history of peptic ulcer disease secondary to H. pylori infection status post treatment with triple therapy in 2016 seen in consultation for anemia and intermittent melena.  Concern for recurrence of gastric ulcers  Recommend to start omeprazole 40 mg daily before breakfast Recommend upper endoscopy next week Recheck CBC, iron studies, perform H. pylori breath test as patient is off PPI at this time   Follow up in 1 month   Cephas Darby, MD

## 2018-11-19 LAB — IRON AND TIBC
Iron Saturation: 35 % (ref 15–55)
Iron: 94 ug/dL (ref 38–169)
Total Iron Binding Capacity: 272 ug/dL (ref 250–450)
UIBC: 178 ug/dL (ref 111–343)

## 2018-11-19 LAB — CBC
Hematocrit: 39.8 % (ref 37.5–51.0)
Hemoglobin: 13.4 g/dL (ref 13.0–17.7)
MCH: 32.9 pg (ref 26.6–33.0)
MCHC: 33.7 g/dL (ref 31.5–35.7)
MCV: 98 fL — ABNORMAL HIGH (ref 79–97)
Platelets: 159 10*3/uL (ref 150–450)
RBC: 4.07 x10E6/uL — ABNORMAL LOW (ref 4.14–5.80)
RDW: 13 % (ref 11.6–15.4)
WBC: 6.7 10*3/uL (ref 3.4–10.8)

## 2018-11-19 LAB — FERRITIN: Ferritin: 245 ng/mL (ref 30–400)

## 2018-11-19 LAB — H. PYLORI BREATH TEST: H pylori Breath Test: NEGATIVE

## 2018-11-21 ENCOUNTER — Other Ambulatory Visit: Admission: RE | Admit: 2018-11-21 | Payer: Medicare Other | Source: Ambulatory Visit

## 2018-11-24 ENCOUNTER — Encounter: Admission: RE | Payer: Self-pay | Source: Home / Self Care

## 2018-11-24 ENCOUNTER — Ambulatory Visit: Admission: RE | Admit: 2018-11-24 | Payer: Medicare Other | Source: Home / Self Care | Admitting: Gastroenterology

## 2018-11-24 SURGERY — ESOPHAGOGASTRODUODENOSCOPY (EGD) WITH PROPOFOL
Anesthesia: General

## 2018-11-29 ENCOUNTER — Ambulatory Visit: Payer: Medicare Other

## 2018-12-14 ENCOUNTER — Ambulatory Visit (INDEPENDENT_AMBULATORY_CARE_PROVIDER_SITE_OTHER): Payer: Medicare Other

## 2018-12-14 ENCOUNTER — Other Ambulatory Visit: Payer: Self-pay

## 2018-12-14 DIAGNOSIS — Z23 Encounter for immunization: Secondary | ICD-10-CM

## 2018-12-27 ENCOUNTER — Telehealth: Payer: Self-pay | Admitting: Urology

## 2018-12-27 NOTE — Telephone Encounter (Signed)
According to Epic the patient was scheduled for 11-16-18 and 11-25-18 and CX both appts stating that our office wanted him to CX?? Not sure why he would say that? Do you have any idea?   Sharyn Lull

## 2018-12-27 NOTE — Telephone Encounter (Signed)
I do not have any idea why we wanted to cancel his prostate MRI.  Please schedule the prostate MRI.

## 2018-12-27 NOTE — Telephone Encounter (Signed)
Would you look into the status of [name] prostate MRI?

## 2018-12-28 ENCOUNTER — Other Ambulatory Visit: Payer: Self-pay | Admitting: Family Medicine

## 2018-12-28 DIAGNOSIS — N401 Enlarged prostate with lower urinary tract symptoms: Secondary | ICD-10-CM

## 2018-12-28 DIAGNOSIS — N138 Other obstructive and reflux uropathy: Secondary | ICD-10-CM

## 2018-12-28 DIAGNOSIS — I10 Essential (primary) hypertension: Secondary | ICD-10-CM

## 2018-12-28 NOTE — Telephone Encounter (Signed)
Called patient and he said he was not doing, he does not need it and he is not going to do it. End of story.   Sharyn Lull

## 2019-01-06 ENCOUNTER — Ambulatory Visit: Payer: Medicare Other | Admitting: Gastroenterology

## 2019-01-10 ENCOUNTER — Ambulatory Visit (INDEPENDENT_AMBULATORY_CARE_PROVIDER_SITE_OTHER): Payer: Medicare Other | Admitting: Gastroenterology

## 2019-01-10 ENCOUNTER — Encounter: Payer: Self-pay | Admitting: Gastroenterology

## 2019-01-10 ENCOUNTER — Other Ambulatory Visit: Payer: Self-pay

## 2019-01-10 ENCOUNTER — Encounter (INDEPENDENT_AMBULATORY_CARE_PROVIDER_SITE_OTHER): Payer: Self-pay

## 2019-01-10 VITALS — BP 115/56 | HR 78 | Temp 98.1°F | Wt 227.0 lb

## 2019-01-10 DIAGNOSIS — Z8711 Personal history of peptic ulcer disease: Secondary | ICD-10-CM

## 2019-01-10 MED ORDER — PANTOPRAZOLE SODIUM 40 MG PO TBEC: 40.0000 mg | DELAYED_RELEASE_TABLET | Freq: Every day | ORAL | 3 refills | Status: DC

## 2019-01-10 NOTE — Progress Notes (Signed)
Jonathan Darby, MD [address]  [place]  [place], [place] [address]  Main: [phone]  Fax: [phone]    Gastroenterology Consultation  Referring Provider:     Nobie Putnam * Primary Care Physician:  [name], DO Primary Gastroenterologist:  Dr. Cephas Nguyen Reason for Consultation:     Intermittent melena, anemia        HPI:   SHAQUIEL WESTOVER is a 84 y.o. male referred by Dr. Parks Ranger, Devonne Doughty, DO  for consultation & management of intermittent melena, anemia Patient has history of stroke occurred in 06/2017, has been on aspirin and Plavix since then.  He does have history of gastric ulcers, pyloric stenosis that was dilated in 2016 by Dr. Verdie Shire, found to have H. pylori gastritis and he was treated with triple therapy at that time, unclear if infection eradication was confirmed.  He did not see him since then.  He is not on any acid suppressive therapy at present.  Patient denies abdominal pain, nausea or vomiting.  He does notice intermittent black stools sometimes once a week or less.  He denies generalized weakness, lethargy, rectal bleeding.  He does not know when he had a colonoscopy He has some residual memory loss since stroke He is functionally independent, active otherwise  Follow-up visit 01/10/2019 Patient canceled his upper endoscopy because he felt he is no longer bleeding and he felt fine.  He did not start omeprazole that I prescribed during last visit.  He continues to take aspirin and Plavix daily.  He denies black stools, reports his stools are hard and brown.  He does report constipation for which he is taking over-the-counter stool softener as needed.  NSAIDs: None  Antiplts/Anticoagulants/Anti thrombotics: Aspirin and Plavix given his history of stroke  GI Procedures: EGD in 2016 - Normal esophagus. - Gastric ulcers oozing blood. - Gastric ulcer with clean base. Biopsied. - Normal examined duodenum. - Pyloric  stenosis from prepyloric ulcers.  DIAGNOSIS:  A. STOMACH; COLD BIOPSY:  - MODERATE CHRONIC GASTRITIS WITH MILD TO MODERATE ACTIVITY,  HELICOBACTER PYLORI-ASSOCIATED.  - NEGATIVE FOR DYSPLASIA AND MALIGNANCY.    Past Medical History:  Diagnosis Date  . Bowel obstruction ([place])   . BPH (benign prostatic hypertrophy)   . Gastric outlet obstruction   . High cholesterol   . Hyperchloremia   . Hypertension   . Pancreatitis     Past Surgical History:  Procedure Laterality Date  . ESOPHAGOGASTRODUODENOSCOPY N/A 12/03/2014   Procedure: ESOPHAGOGASTRODUODENOSCOPY (EGD);  Surgeon: Hulen Luster, MD;  Location: [facility] ENDOSCOPY;  Service: Endoscopy;  Laterality: N/A;    Current Outpatient Medications:  .  aspirin EC 81 MG tablet, Take 81 mg by mouth daily., Disp: , Rfl:  .  atorvastatin (LIPITOR) 40 MG tablet, TAKE 1 TABLET DAILY AT 6 P.M., Disp: 90 tablet, Rfl: 3 .  clopidogrel (PLAVIX) 75 MG tablet, TAKE 1 TABLET DAILY, Disp: 90 tablet, Rfl: 3 .  fluticasone (FLONASE) 50 MCG/ACT nasal spray, Place 2 sprays into both nostrils daily. Use for 4-6 weeks then stop and use seasonally or as needed., Disp: 48 g, Rfl: 1 .  lisinopril (ZESTRIL) 20 MG tablet, TAKE 1 TABLET DAILY, Disp: 90 tablet, Rfl: 3 .  loratadine (CLARITIN) 10 MG tablet, Take 1 tablet (10 mg total) by mouth daily. Use for 4-6 weeks then stop, and use as needed or seasonally, Disp: 90 tablet, Rfl: 3 .  terazosin (HYTRIN) 5 MG capsule, TAKE 1 CAPSULE AT BEDTIME,  Disp: 90 capsule, Rfl: 3 .  triamcinolone cream (KENALOG) 0.5 %, Apply 1 application topically 2 (two) times daily. To affected areas, for up to 2 weeks., Disp: 30 g, Rfl: 1 .  pantoprazole (PROTONIX) 40 MG tablet, Take 1 tablet (40 mg total) by mouth daily before breakfast., Disp: 30 tablet, Rfl: 3    Family History  Problem Relation Age of Onset  . Heart disease Mother   . Heart disease Father   . Heart disease Brother      Social History   Tobacco Use  . Smoking  status: Former Smoker    Quit date: 1985    Years since quitting: 35.7  . Smokeless tobacco: Former Network engineer Use Topics  . Alcohol use: No  . Drug use: No    Allergies as of 01/10/2019  . (No Known Allergies)    Review of Systems:    All systems reviewed and negative except where noted in HPI.   Physical Exam:  BP (!) 115/56 (BP Location: Left Arm, Patient Position: Sitting, Cuff Size: Normal)   Pulse 78   Temp 98.1 F (36.7 C) (Oral)   Wt 227 lb (103 kg)   BMI 31.66 kg/m  No LMP for male patient.  General:   Alert,  Well-developed, well-nourished, pleasant and cooperative in NAD Head:  Normocephalic and atraumatic. Eyes:  Sclera clear, no icterus.   Conjunctiva pink. Ears:  Normal auditory acuity. Nose:  No deformity, discharge, or lesions. Mouth:  No deformity or lesions,oropharynx pink & moist. Neck:  Supple; no masses or thyromegaly. Lungs:  Respirations even and unlabored.  Clear throughout to auscultation.   No wheezes, crackles, or rhonchi. No acute distress. Heart:  Regular rate and rhythm; no murmurs, clicks, rubs, or gallops. Abdomen:  Normal bowel sounds. Soft, non-tender and non-distended without masses, hepatosplenomegaly or hernias noted.  No guarding or rebound tenderness.   Rectal: Not performed Msk:  Symmetrical without gross deformities. Good, equal movement & strength bilaterally. Pulses:  Normal pulses noted. Extremities:  No clubbing or edema.  No cyanosis. Neurologic:  Alert and oriented x3;  grossly normal neurologically. Skin:  Intact without significant lesions or rashes. No jaundice. Psych:  Alert and cooperative. Normal mood and affect.  Imaging Studies: Reviewed  Assessment and Plan:   DYLLAN SCHLEETER is a 84 y.o. male with obesity, hypertension, BPH, history of stroke on aspirin and Plavix, known history of peptic ulcer disease secondary to H. pylori infection status post treatment with triple therapy in 2016 seen for follow-up of  anemia and intermittent melena.  I expressed concern regarding recurrence of gastric ulcers and therefore recommended upper endoscopy.  H. pylori breath test negative, normal hemoglobin and no evidence of iron deficiency.  Today, patient is persistent about not pursuing this procedure at this time  Recommend to start pantoprazole 40 mg daily before breakfast and continue long-term as he is on DAPT  Chronic constipation Discussed with him about high-fiber diet Fiber supplements, information provided  Follow up as needed   Jonathan Darby, MD

## 2019-01-10 NOTE — Patient Instructions (Signed)

## 2019-04-26 ENCOUNTER — Encounter: Payer: Self-pay | Admitting: Family Medicine

## 2019-04-26 ENCOUNTER — Ambulatory Visit (INDEPENDENT_AMBULATORY_CARE_PROVIDER_SITE_OTHER): Payer: Medicare Other | Admitting: Family Medicine

## 2019-04-26 ENCOUNTER — Other Ambulatory Visit: Payer: Self-pay

## 2019-04-26 DIAGNOSIS — R195 Other fecal abnormalities: Secondary | ICD-10-CM

## 2019-04-26 DIAGNOSIS — R338 Other retention of urine: Secondary | ICD-10-CM | POA: Diagnosis not present

## 2019-04-26 DIAGNOSIS — R152 Fecal urgency: Secondary | ICD-10-CM | POA: Diagnosis not present

## 2019-04-26 DIAGNOSIS — N401 Enlarged prostate with lower urinary tract symptoms: Secondary | ICD-10-CM | POA: Diagnosis not present

## 2019-04-26 NOTE — Progress Notes (Signed)
Virtual Visit via Telephone The purpose of this virtual visit is to provide medical care while limiting exposure to the novel coronavirus (COVID19) for both patient and office staff.  Consent was obtained for phone visit:  Yes.   Answered questions that patient had about telehealth interaction:  Yes.   I discussed the limitations, risks, security and privacy concerns of performing an evaluation and management service by telephone. I also discussed with the patient that there may be a patient responsible charge related to this service. The patient expressed understanding and agreed to proceed.  Patient Location: Home Provider Location: [facility])  ---------------------------------------------------------------------- Chief Complaint  Patient presents with  . Urinary Retention    pt state he feel like he got use the bathroom and unable to urinate. x 1 week   . Constipation    irregular bm x 1 week    S: Reviewed CMA documentation. I have called patient and gathered additional HPI as follows:  Irregular Bowel Functions with urgency / fecal incontinence Reports that symptoms started >1 month ago with some urgency to have BM with soft stool, occasionally has episode of incontinence of stool if he cannot make it to bathroom soon enough. He has difficulty getting to bathroom in time. He does not endorse abdominal pain, nausea vomiting, dark stools, abdominal cramping or other associated symptoms. He does not take any fiber. He has had some changes to diet lately. He used to see [facility] for melena history and gastritis. He is not seeing them now, he does not endorse those problems.  Additional complaint Urinary retention - history of BPH. Followed by [facility], last seen few months ago, on Terazosin currently. He will follow-up with them as this problem has been present for over month or more.  Denies any high risk travel to areas of current concern for COVID19. Denies any  known or suspected exposure to person with or possibly with COVID19.  Denies any fevers, chills, sweats, body ache, cough, shortness of breath, sinus pain or pressure, headache, abdominal pain, dark stool blood in stool  Past Medical History:  Diagnosis Date  . Bowel obstruction ([place])   . BPH (benign prostatic hypertrophy)   . Gastric outlet obstruction   . High cholesterol   . Hyperchloremia   . Hypertension   . Pancreatitis    Social History   Tobacco Use  . Smoking status: Former Smoker    Quit date: 1985    Years since quitting: 36.0  . Smokeless tobacco: Former Network engineer Use Topics  . Alcohol use: No  . Drug use: No    Current Outpatient Medications:  .  aspirin EC 81 MG tablet, Take 81 mg by mouth daily., Disp: , Rfl:  .  atorvastatin (LIPITOR) 40 MG tablet, TAKE 1 TABLET DAILY AT 6 P.M. (Patient not taking: Reported on 04/26/2019), Disp: 90 tablet, Rfl: 3 .  clopidogrel (PLAVIX) 75 MG tablet, TAKE 1 TABLET DAILY (Patient not taking: Reported on 04/26/2019), Disp: 90 tablet, Rfl: 3 .  fluticasone (FLONASE) 50 MCG/ACT nasal spray, Place 2 sprays into both nostrils daily. Use for 4-6 weeks then stop and use seasonally or as needed., Disp: 48 g, Rfl: 1 .  lisinopril (ZESTRIL) 20 MG tablet, TAKE 1 TABLET DAILY, Disp: 90 tablet, Rfl: 3 .  loratadine (CLARITIN) 10 MG tablet, Take 1 tablet (10 mg total) by mouth daily. Use for 4-6 weeks then stop, and use as needed or seasonally, Disp: 90 tablet, Rfl: 3 .  pantoprazole (PROTONIX) 40 MG tablet, Take 1 tablet (40 mg total) by mouth daily before breakfast., Disp: 30 tablet, Rfl: 3 .  terazosin (HYTRIN) 5 MG capsule, TAKE 1 CAPSULE AT BEDTIME, Disp: 90 capsule, Rfl: 3 .  triamcinolone cream (KENALOG) 0.5 %, Apply 1 application topically 2 (two) times daily. To affected areas, for up to 2 weeks., Disp: 30 g, Rfl: 1  Depression screen [facility] 2/9 10/18/2018 03/02/2018 02/08/2018  Decreased Interest 0 0 0  Down, Depressed, Hopeless 0 0 0   PHQ - 2 Score 0 0 0    No flowsheet data found.  -------------------------------------------------------------------------- O: No physical exam performed due to remote telephone encounter.  Lab results reviewed.  No results found for this or any previous visit (from the past 2160 hour(s)).  -------------------------------------------------------------------------- A&P:  Problem List Items Addressed This Visit    None    Visit Diagnoses    Fecal urgency    -  Primary   Loose stools       Urinary retention due to benign prostatic hyperplasia         Uncertain etiology for new stool changes in past 1 month, seems episodic fecal urgency without any other alarm symptoms. Duration seems inconsistent with viral gastro or covid type symptoms, but cannot rule out. Less likely infectious nature based on history, seems to be primary issue of getting to bathroom in time, otherwise he just has loose stools.  Recommend metamucil fiber supplement for bulking agent Can try Imodium PRN OTC in more urgent circumstances if persistent problem He can follow-up with GI if not improving  Regarding urinary symptoms, advised he return to [facility] to discuss BPH urinary retention symptoms further. Seems to be persistent chronic problem.   No orders of the defined types were placed in this encounter.   Follow-up: - Return as needed  Patient verbalizes understanding with the above medical recommendations including the limitation of remote medical advice.  Specific follow-up and call-back criteria were given for patient to follow-up or seek medical care more urgently if needed.   - Time spent in direct consultation with patient on phone: 12 minutes  Nobie Putnam, [facility] 04/26/2019, 11:42 AM

## 2019-05-04 ENCOUNTER — Telehealth: Payer: Self-pay | Admitting: Family Medicine

## 2019-05-04 NOTE — Chronic Care Management (AMB) (Signed)
  Chronic Care Management   Outreach Note  05/04/2019 Name: [name] MRN: [id number] DOB: [birth year]  Referred by: [name], DO Reason for referral : Chronic Care Management (initial CCM outreach was unsuccessful)   An unsuccessful telephone outreach was attempted today. The patient was referred to the case management team by for assistance with care management and care coordination.   Follow Up Plan: The care management team will reach out to the patient again over the next 7 days.   [place], [place] [address] Direct Dial: [email or web address]  Website: [email or web address]

## 2019-05-12 NOTE — Chronic Care Management (AMB) (Signed)
  Chronic Care Management   Note  05/12/2019 Name: NORTON BIVINS MRN: [id number] DOB: 12/14/1934  TYRAIL GRANDFIELD is a 85 y.o. year old male who is a primary care patient of [name], DO. I reached out to [name] by phone today in response to a referral sent by Mr. [name] Leppanen's health plan.     Mr. Murley was given information about Chronic Care Management services today including:  1. CCM service includes personalized support from designated clinical staff supervised by his physician, including individualized plan of care and coordination with other care providers 2. 24/7 contact phone numbers for assistance for urgent and routine care needs. 3. Service will only be billed when office clinical staff spend 20 minutes or more in a month to coordinate care. 4. Only one practitioner may furnish and bill the service in a calendar month. 5. The patient may stop CCM services at any time (effective at the end of the month) by phone call to the office staff. 6. The patient will be responsible for cost sharing (co-pay) of up to 20% of the service fee (after annual deductible is met).  Patient agreed to services and verbal consent obtained.   Follow up plan: Telephone appointment with care management team member scheduled for: 05/16/2019  [place], [place] [facility]  [place], [place] [address] Direct Dial: [email or web address]  Website: [email or web address]

## 2019-05-16 ENCOUNTER — Ambulatory Visit (INDEPENDENT_AMBULATORY_CARE_PROVIDER_SITE_OTHER): Payer: Medicare Other | Admitting: Licensed Clinical Social Worker

## 2019-05-16 DIAGNOSIS — R42 Dizziness and giddiness: Secondary | ICD-10-CM

## 2019-05-16 DIAGNOSIS — N183 Chronic kidney disease, stage 3 unspecified: Secondary | ICD-10-CM | POA: Diagnosis not present

## 2019-05-16 DIAGNOSIS — I10 Essential (primary) hypertension: Secondary | ICD-10-CM

## 2019-05-16 DIAGNOSIS — E782 Mixed hyperlipidemia: Secondary | ICD-10-CM

## 2019-05-16 DIAGNOSIS — R413 Other amnesia: Secondary | ICD-10-CM

## 2019-05-16 NOTE — Chronic Care Management (AMB) (Signed)
  Care Management Note   [name] is a 85 y.o. year old male who is a primary care patient of Jonathan Hauser, DO. The CM team was consulted for assistance with chronic disease management and care coordination.   I reached out to [name] by phone today.   [name] was given information about Chronic Care Management services today including:  1. CCM service includes personalized support from designated clinical staff supervised by his physician, including individualized plan of care and coordination with other care providers 2. 24/7 contact phone numbers for assistance for urgent and routine care needs. 3. Service will only be billed when office clinical staff spend 20 minutes or more in a month to coordinate care. 4. Only one practitioner may furnish and bill the service in a calendar month. 5. The patient may stop CCM services at any time (effective at the end of the month) by phone call to the office staff. 6. The patient will be responsible for cost sharing (co-pay) of up to 20% of the service fee (after annual deductible is met). Patient agreed to services and verbal consent obtained.    Review of patient status, including review of consultants reports, relevant laboratory and other test results, and collaboration with appropriate care team members and the patient's provider was performed as part of comprehensive patient evaluation and provision of chronic care management services.  Goals Addressed    . "I need help with caregiving in the home" (pt-stated)       Current Barriers:  . Limited social support . ADL IADL limitations . Social Isolation . Limited access to caregiver . Inability to perform ADL's independently . Inability to perform IADL's independently . Lacks knowledge of community resource: personal care services  Clinical Social Work Clinical Goal(s):  [name] Kitchen Over the next 120 days, patient will work with SW to address concerns related to lack of care within  the home  Interventions: . Patient interviewed and appropriate assessments performed . Provided patient with information about available personal care services within the area such as C.H.O.R.E program. Patient feels that he will need be eligible for Medicaid. . Discussed plans with patient for ongoing care management follow up and provided patient with direct contact information for care management team . Advised patient to considering hiring a private pay caregiver as Medicare does not cover these services . Assisted patient/caregiver with obtaining information about health plan benefits . Provided education and assistance to client regarding Advanced Directives. . Provided education to patient/caregiver regarding level of care options. . Provided education to patient/caregiver about Hospice and/or Palliative Care services  Patient Self Care Activities:  . Attends all scheduled provider appointments . Calls provider office for new concerns or questions . Lacks social connections . Unable to perform ADLs independently . Unable to perform IADLs independently  Initial goal documentation     Follow Up Plan: The care management team will reach out to the patient again over the next 90 days.   [name], BSW, MSW, [email or web address] Phone: [phone]

## 2019-06-20 ENCOUNTER — Ambulatory Visit (INDEPENDENT_AMBULATORY_CARE_PROVIDER_SITE_OTHER): Payer: Medicare Other | Admitting: Licensed Clinical Social Worker

## 2019-06-20 ENCOUNTER — Other Ambulatory Visit: Payer: Self-pay | Admitting: Family Medicine

## 2019-06-20 DIAGNOSIS — R413 Other amnesia: Secondary | ICD-10-CM

## 2019-06-20 DIAGNOSIS — M21372 Foot drop, left foot: Secondary | ICD-10-CM

## 2019-06-20 DIAGNOSIS — N183 Chronic kidney disease, stage 3 unspecified: Secondary | ICD-10-CM

## 2019-06-20 DIAGNOSIS — I10 Essential (primary) hypertension: Secondary | ICD-10-CM

## 2019-06-20 DIAGNOSIS — R42 Dizziness and giddiness: Secondary | ICD-10-CM

## 2019-06-20 DIAGNOSIS — L97521 Non-pressure chronic ulcer of other part of left foot limited to breakdown of skin: Secondary | ICD-10-CM

## 2019-06-20 NOTE — Chronic Care Management (AMB) (Signed)
Chronic Care Management    Clinical Social Work Follow Up Note  06/20/2019 Name: DIONY SINIBALDI MRN: [id number] DOB: 1934-06-23  [name] is a 85 y.o. year old male who is a primary care patient of [name], DO. The CCM team was consulted for assistance with [employer]  and Level of Care Concerns.   Review of patient status, including review of consultants reports, other relevant assessments, and collaboration with appropriate care team members and the patient's provider was performed as part of comprehensive patient evaluation and provision of chronic care management services.    SDOH (Social Determinants of Health) assessments performed: Yes    Advanced Directives Status: <no information> See Care Plan for related entries.   Outpatient Encounter Medications as of 06/20/2019  Medication Sig  . aspirin EC 81 MG tablet Take 81 mg by mouth daily.  Marland Kitchen atorvastatin (LIPITOR) 40 MG tablet TAKE 1 TABLET DAILY AT 6 P.M. (Patient not taking: Reported on 04/26/2019)  . clopidogrel (PLAVIX) 75 MG tablet TAKE 1 TABLET DAILY (Patient not taking: Reported on 04/26/2019)  . fluticasone (FLONASE) 50 MCG/ACT nasal spray Place 2 sprays into both nostrils daily. Use for 4-6 weeks then stop and use seasonally or as needed.  Marland Kitchen lisinopril (ZESTRIL) 20 MG tablet TAKE 1 TABLET DAILY  . loratadine (CLARITIN) 10 MG tablet Take 1 tablet (10 mg total) by mouth daily. Use for 4-6 weeks then stop, and use as needed or seasonally  . pantoprazole (PROTONIX) 40 MG tablet Take 1 tablet (40 mg total) by mouth daily before breakfast.  . terazosin (HYTRIN) 5 MG capsule TAKE 1 CAPSULE AT BEDTIME  . triamcinolone cream (KENALOG) 0.5 % Apply 1 application topically 2 (two) times daily. To affected areas, for up to 2 weeks.   No facility-administered encounter medications on file as of 06/20/2019.     Goals Addressed    . "I need help with caregiving in the home" (pt-stated)       Current Barriers:   . Limited social support . ADL IADL limitations . Social Isolation . Limited access to caregiver . Inability to perform ADL's independently . Inability to perform IADL's independently . Lacks knowledge of community resource: personal care services  Clinical Social Work Clinical Goal(s):  Marland Kitchen Over the next 120 days, patient will work with SW to address concerns related to lack of care within the home  Interventions: . Patient interviewed and appropriate assessments performed . Provided patient with information about available personal care services within the area such as C.H.O.R.E program. Patient feels that he will need be eligible for Medicaid. . Discussed plans with patient for ongoing care management follow up and provided patient with direct contact information for care management team . Advised patient to considering hiring a private pay caregiver as Medicare does not cover these services . Assisted patient/caregiver with obtaining information about health plan benefits . Provided education and assistance to client regarding Advanced Directives. . Provided education to patient/caregiver regarding level of care options. . Provided education to patient/caregiver about Hospice and/or Palliative Care services . UPDATE--Patient is wanting a Podiatrist referral. Patient shares that since his stroke, he injured several parts of his foot. He shares that he can tell that these areas are not healing appropriately. Patient denies having any pain from these wounds though. [name] will sent referral request to PCP. [name] will update CCM RNCM as well. Marland Kitchen [name] will place patient on wait list for C.H.O.R.E program during next scheduled CCM appointment  Patient Self Care Activities:  . Attends all scheduled provider appointments . Calls provider office for new concerns or questions . Lacks social connections . Unable to perform ADLs independently . Unable to perform IADLs independently  Please see past  updates related to this goal by clicking on the "Past Updates" button in the selected goal      Follow Up Plan: SW will follow up with patient by phone over the next quarter  Eula Fried, [place], MSW, [email or web address] Phone: [phone]

## 2019-06-26 ENCOUNTER — Ambulatory Visit: Payer: Self-pay | Admitting: General Practice

## 2019-06-26 ENCOUNTER — Telehealth: Payer: Self-pay | Admitting: General Practice

## 2019-06-26 DIAGNOSIS — N183 Chronic kidney disease, stage 3 unspecified: Secondary | ICD-10-CM

## 2019-06-26 DIAGNOSIS — E782 Mixed hyperlipidemia: Secondary | ICD-10-CM

## 2019-06-26 DIAGNOSIS — I10 Essential (primary) hypertension: Secondary | ICD-10-CM

## 2019-06-26 DIAGNOSIS — R413 Other amnesia: Secondary | ICD-10-CM

## 2019-06-26 DIAGNOSIS — L97521 Non-pressure chronic ulcer of other part of left foot limited to breakdown of skin: Secondary | ICD-10-CM

## 2019-06-26 NOTE — Chronic Care Management (AMB) (Signed)
Chronic Care Management   Initial Visit Note  06/26/2019 Name: [name] MRN: [id number] DOB: 01/31/1935  Referred by: [name], DO Reason for referral : Chronic Care Management (Initial: HTN/CKD3/HLD- foot issues)   [name] is a 85 y.o. year old male who is a primary care patient of [name], DO. The CCM team was consulted for assistance with chronic disease management and care coordination needs related to HTN, HLD and CKD Stage 3  Review of patient status, including review of consultants reports, relevant laboratory and other test results, and collaboration with appropriate care team members and the patient's provider was performed as part of comprehensive patient evaluation and provision of chronic care management services.    SDOH (Social Determinants of Health) assessments performed: Yes See Care Plan activities for detailed interventions related to SDOH)  SDOH Interventions     Most Recent Value  SDOH Interventions  Alcohol Brief Interventions/Follow-up  AUDIT Score <7 follow-up not indicated       Medications: Outpatient Encounter Medications as of 06/26/2019  Medication Sig   aspirin EC 81 MG tablet Take 81 mg by mouth daily.   atorvastatin (LIPITOR) 40 MG tablet TAKE 1 TABLET DAILY AT 6 P.M. (Patient not taking: Reported on 04/26/2019)   clopidogrel (PLAVIX) 75 MG tablet TAKE 1 TABLET DAILY (Patient not taking: Reported on 04/26/2019)   fluticasone (FLONASE) 50 MCG/ACT nasal spray Place 2 sprays into both nostrils daily. Use for 4-6 weeks then stop and use seasonally or as needed.   lisinopril (ZESTRIL) 20 MG tablet TAKE 1 TABLET DAILY   loratadine (CLARITIN) 10 MG tablet Take 1 tablet (10 mg total) by mouth daily. Use for 4-6 weeks then stop, and use as needed or seasonally   pantoprazole (PROTONIX) 40 MG tablet Take 1 tablet (40 mg total) by mouth daily before breakfast.   terazosin (HYTRIN) 5 MG capsule TAKE 1 CAPSULE AT BEDTIME     triamcinolone cream (KENALOG) 0.5 % Apply 1 application topically 2 (two) times daily. To affected areas, for up to 2 weeks.   No facility-administered encounter medications on file as of 06/26/2019.     Objective:  BP Readings from Last 3 Encounters:  01/10/19 (!) 115/56  11/18/18 (!) 146/70  11/15/18 125/73    Goals Addressed            This Visit's Progress    RNCM: PT-"Something is wrong with my foot" (pt-stated)       CARE PLAN ENTRY (see longtitudinal plan of care for additional care plan information)  Current Barriers:   Chronic Disease Management support, education, and care coordination needs related to HTN, HLD, and CKD Stage 3  Memory issues  Limited social support  Safety concerns  Knowledge deficit related to area on his foot that is not healing  Clinical Goal(s) related to HTN, HLD, and CKD Stage 3 :  Over the next 120 days, patient will:   Work with the care management team to address educational, disease management, and care coordination needs   Begin or continue self health monitoring activities as directed today Measure and record blood pressure 3 times per week and adhere to a heart healthy diet  Call provider office for new or worsened signs and symptoms Blood pressure findings outside established parameters, New or worsened symptom related to HLD/CKD, and new issues related to non-healing area on his foot  Call care management team with questions or concerns  Verbalize basic understanding of patient centered plan  of care established today  Interventions related to HTN, HLD, and CKD Stage 3 :   Evaluation of current treatment plans and patient's adherence to plan as established by provider  Assessed patient understanding of disease states  Assessed patient's education and care coordination needs-the patient has an appointment on 3-18 with podiatrist to evaluate his foot   Provided disease specific education to patient- education and  support on a heart healthy diet.  The patient states he eats well. Denies any issues with current diet  Collaborated with appropriate clinical care team members regarding patient needs- the patient is currently working with [name]. Knows resources are available  Patient Self Care Activities related to HTN, HLD, and CKD Stage 3 :   Patient is unable to independently self-manage chronic health conditions  Initial goal documentation         [name] was given information about Chronic Care Management services today including:  1. CCM service includes personalized support from designated clinical staff supervised by his physician, including individualized plan of care and coordination with other care providers 2. 24/7 contact phone numbers for assistance for urgent and routine care needs. 3. Service will only be billed when office clinical staff spend 20 minutes or more in a month to coordinate care. 4. Only one practitioner may furnish and bill the service in a calendar month. 5. The patient may stop CCM services at any time (effective at the end of the month) by phone call to the office staff. 6. The patient will be responsible for cost sharing (co-pay) of up to 20% of the service fee (after annual deductible is met).  Patient agreed to services and verbal consent obtained.   Plan:   The care management team will reach out to the patient again over the next 30 to 60  days.   [name] RN, MSN, [place] Mobile: [phone]

## 2019-06-26 NOTE — Patient Instructions (Signed)
Visit Information  Goals Addressed            This Visit's Progress   . RNCM: PT-"Something is wrong with my foot" (pt-stated)       CARE PLAN ENTRY (see longtitudinal plan of care for additional care plan information)  Current Barriers:  . Chronic Disease Management support, education, and care coordination needs related to HTN, HLD, and CKD Stage 3 . Memory issues . Limited social support . Safety concerns . Knowledge deficit related to area on his foot that is not healing  Clinical Goal(s) related to HTN, HLD, and CKD Stage 3 :  Over the next 120 days, patient will:  . Work with the care management team to address educational, disease management, and care coordination needs  . Begin or continue self health monitoring activities as directed today Measure and record blood pressure 3 times per week and adhere to a heart healthy diet . Call provider office for new or worsened signs and symptoms Blood pressure findings outside established parameters, New or worsened symptom related to HLD/CKD, and new issues related to non-healing area on his foot . Call care management team with questions or concerns . Verbalize basic understanding of patient centered plan of care established today  Interventions related to HTN, HLD, and CKD Stage 3 :  . Evaluation of current treatment plans and patient's adherence to plan as established by provider . Assessed patient understanding of disease states . Assessed patient's education and care coordination needs-the patient has an appointment on 3-18 with podiatrist to evaluate his foot  . Provided disease specific education to patient- education and support on a heart healthy diet.  The patient states he eats well. Denies any issues with current diet . Collaborated with appropriate clinical care team members regarding patient needs- the patient is currently working with [name]. Knows resources are available  Patient Self Care Activities related to HTN,  HLD, and CKD Stage 3 :  . Patient is unable to independently self-manage chronic health conditions  Initial goal documentation        [name] was given information about Chronic Care Management services today including:  1. CCM service includes personalized support from designated clinical staff supervised by his physician, including individualized plan of care and coordination with other care providers 2. 24/7 contact phone numbers for assistance for urgent and routine care needs. 3. Service will only be billed when office clinical staff spend 20 minutes or more in a month to coordinate care. 4. Only one practitioner may furnish and bill the service in a calendar month. 5. The patient may stop CCM services at any time (effective at the end of the month) by phone call to the office staff. 6. The patient will be responsible for cost sharing (co-pay) of up to 20% of the service fee (after annual deductible is met).  Patient agreed to services and verbal consent obtained.   Patient verbalizes understanding of instructions provided today.   The care management team will reach out to the patient again over the next 60 days.   [name] RN, MSN, [place] Mobile: [phone]

## 2019-07-04 ENCOUNTER — Other Ambulatory Visit: Payer: Self-pay

## 2019-07-04 ENCOUNTER — Ambulatory Visit (INDEPENDENT_AMBULATORY_CARE_PROVIDER_SITE_OTHER): Payer: Medicare Other | Admitting: Podiatry

## 2019-07-04 ENCOUNTER — Ambulatory Visit (INDEPENDENT_AMBULATORY_CARE_PROVIDER_SITE_OTHER): Payer: Medicare Other

## 2019-07-04 DIAGNOSIS — L97412 Non-pressure chronic ulcer of right heel and midfoot with fat layer exposed: Secondary | ICD-10-CM | POA: Diagnosis not present

## 2019-07-04 DIAGNOSIS — M779 Enthesopathy, unspecified: Secondary | ICD-10-CM

## 2019-07-04 MED ORDER — GENTAMICIN SULFATE 0.1 % EX CREA: 1.0000 "application " | TOPICAL_CREAM | Freq: Two times a day (BID) | CUTANEOUS | 1 refills | Status: DC

## 2019-07-04 MED ORDER — DOXYCYCLINE HYCLATE 100 MG PO TABS: 100.0000 mg | ORAL_TABLET | Freq: Two times a day (BID) | ORAL | 0 refills | Status: DC

## 2019-07-06 NOTE — Progress Notes (Signed)
   HPI: 85 y.o. male presenting today as a new patient with a chief complaint of soreness of the right heel secondary to a wound that appeared about one month ago. Walking increases the pain. He has been pulling skin from the wound himself for treatment. Patient is here for further evaluation and treatment.   Past Medical History:  Diagnosis Date  . Bowel obstruction ([place])   . BPH (benign prostatic hypertrophy)   . Gastric outlet obstruction   . High cholesterol   . Hyperchloremia   . Hypertension   . Pancreatitis      Physical Exam: General: The patient is alert and oriented x3 in no acute distress.  Dermatology: Wound noted to the right heel measuring approximately 0.7 x 0.7 x 0.2 cm.   To the above-noted ulceration, there is no eschar. There is a moderate amount of slough, fibrin and necrotic tissue. Granulation tissue and wound base is red. There is no malodor. There is a minimal amount of serosanginous drainage noted. Periwound integrity is intact.  Skin is warm, dry and supple bilateral lower extremities.   Vascular: Palpable pedal pulses bilaterally. No edema or erythema noted. Capillary refill within normal limits.  Neurological: Epicritic and protective threshold grossly intact bilaterally.   Musculoskeletal Exam: Range of motion within normal limits to all pedal and ankle joints bilateral. Muscle strength 5/5 in all groups bilateral.   Radiographic Exam:  Normal osseous mineralization. Joint spaces preserved. No fracture/dislocation/boney destruction.    Assessment: 1. Ulceration of the right heel   Plan of Care:  1. Patient evaluated. X-Rays reviewed.  2. Medically necessary excisional debridement including subcutaneous tissue was performed using a tissue nipper and a chisel blade. Excisional debridement of all the necrotic nonviable tissue down to healthy bleeding viable tissue was performed with post-debridement measurements same as pre-. 3. The wound was cleansed  and dry sterile dressing applied. 4. Prescription for Doxycycline 100 mg provided to patient.  5. Prescription for Gentamicin cream provided to patient to use daily with a bandage.  6. Return to clinic in 3 weeks.      Edrick Kins, DPM [facility]  Dr. Edrick Kins, DPM    [address], [place] [address]                Office [phone]  Fax [phone]

## 2019-07-19 ENCOUNTER — Other Ambulatory Visit: Payer: Self-pay | Admitting: Podiatry

## 2019-07-19 DIAGNOSIS — L97412 Non-pressure chronic ulcer of right heel and midfoot with fat layer exposed: Secondary | ICD-10-CM

## 2019-07-25 ENCOUNTER — Ambulatory Visit (INDEPENDENT_AMBULATORY_CARE_PROVIDER_SITE_OTHER): Payer: Medicare Other | Admitting: Podiatry

## 2019-07-25 ENCOUNTER — Other Ambulatory Visit: Payer: Self-pay

## 2019-07-25 DIAGNOSIS — L97412 Non-pressure chronic ulcer of right heel and midfoot with fat layer exposed: Secondary | ICD-10-CM

## 2019-07-27 NOTE — Progress Notes (Signed)
   HPI: 85 y.o. male presenting today for follow up evaluation of an ulceration of the right heel. He states the wound looks better and is improving. He states he started taking the Doxycycline and using the Gentamicin cream for about one week then discontinued both. He denies worsening factors at this time. Patient is here for further evaluation and treatment.   Past Medical History:  Diagnosis Date  . Bowel obstruction ([place])   . BPH (benign prostatic hypertrophy)   . Gastric outlet obstruction   . High cholesterol   . Hyperchloremia   . Hypertension   . Pancreatitis      Physical Exam: General: The patient is alert and oriented x3 in no acute distress.  Dermatology: Wound noted to the right heel measuring approximately 0.6 x 0.6 x 0.2 cm.   To the above-noted ulceration, there is no eschar. There is a moderate amount of slough, fibrin and necrotic tissue. Granulation tissue and wound base is red. There is no malodor. There is a minimal amount of serosanginous drainage noted. Periwound integrity is intact.  Skin is warm, dry and supple bilateral lower extremities.   Vascular: Palpable pedal pulses bilaterally. No edema or erythema noted. Capillary refill within normal limits.  Neurological: Epicritic and protective threshold grossly intact bilaterally.   Musculoskeletal Exam: Range of motion within normal limits to all pedal and ankle joints bilateral. Muscle strength 5/5 in all groups bilateral.   Assessment: 1. Ulceration of the right heel   Plan of Care:  1. Patient evaluated. X 2. Medically necessary excisional debridement including subcutaneous tissue was performed using a tissue nipper and a chisel blade. Excisional debridement of all the necrotic nonviable tissue down to healthy bleeding viable tissue was performed with post-debridement measurements same as pre-. 3. The wound was cleansed and dry sterile dressing applied. 4. Continue using Gentamicin cream daily.  5.  Explained that patient needs to finish oral Doxycycline BID as directed. Patient was only taking one per day.  6. Return to clinic in 3 weeks.      Edrick Kins, DPM [facility]  Dr. Edrick Kins, DPM    [address], [place] [address]                Office [phone]  Fax [phone]

## 2019-07-28 ENCOUNTER — Ambulatory Visit: Payer: Medicare Other | Admitting: Podiatry

## 2019-08-08 ENCOUNTER — Ambulatory Visit: Payer: Self-pay | Admitting: Licensed Clinical Social Worker

## 2019-08-08 NOTE — Chronic Care Management (AMB) (Signed)
  Care Management   Follow Up Note   08/08/2019 Name: [name] MRN: [id number] DOB: October 12, 1934  Referred by: [name], DO Reason for referral : Care Coordination   [name] is a 85 y.o. year old male who is a primary care patient of [name], DO. The care management team was consulted for assistance with care management and care coordination needs.    Review of patient status, including review of consultants reports, relevant laboratory and other test results, and collaboration with appropriate care team members and the patient's provider was performed as part of comprehensive patient evaluation and provision of chronic care management services.    Goals Addressed    . "I need help with caregiving in the home" (pt-stated)       Current Barriers:  . Limited social support . ADL IADL limitations . Social Isolation . Limited access to caregiver . Inability to perform ADL's independently . Inability to perform IADL's independently . Lacks knowledge of community resource: personal care services  Clinical Social Work Clinical Goal(s):  Marland Kitchen Over the next 120 days, patient will work with SW to address concerns related to lack of care within the home  Interventions: . Patient interviewed and appropriate assessments performed . Provided patient with information about available personal care services within the area such as C.H.O.R.E program. Patient feels that he will need be eligible for Medicaid. . Discussed plans with patient for ongoing care management follow up and provided patient with direct contact information for care management team . Advised patient to considering hiring a private pay caregiver as Medicare does not cover these services . Assisted patient/caregiver with obtaining information about health plan benefits . Provided education and assistance to client regarding Advanced Directives. . Provided education to patient/caregiver regarding  level of care options. . Provided education to patient/caregiver about Hospice and/or Palliative Care services . UPDATE--[name] will place patient on wait list for C.H.O.R.E program during next scheduled CCM appointment---Application completed and patient was successfully added on the wait list for C.H.O.R.E program on 08/08/19.  Patient Self Care Activities:  . Attends all scheduled provider appointments . Calls provider office for new concerns or questions . Lacks social connections . Unable to perform ADLs independently . Unable to perform IADLs independently  Please see past updates related to this goal by clicking on the "Past Updates" button in the selected goal      The patient has been provided with contact information for the care management team and has been advised to call with any health related questions or concerns.   Eula Fried, BSW, MSW, [email or web address] Phone: [phone]

## 2019-08-11 ENCOUNTER — Ambulatory Visit: Payer: Medicare Other | Admitting: Podiatry

## 2019-08-14 ENCOUNTER — Telehealth: Payer: Self-pay

## 2019-08-14 ENCOUNTER — Ambulatory Visit: Payer: Self-pay | Admitting: General Practice

## 2019-08-14 NOTE — Chronic Care Management (AMB) (Signed)
  Chronic Care Management   Outreach Note  08/14/2019 Name: [name] MRN: [id number] DOB: 1935/02/08  Referred by: [name], DO Reason for referral : Chronic Care Management (Follow up on Right heel ulcer/HTN/HLD/CKD3)   An unsuccessful telephone outreach was attempted today. The patient was referred to the case management team for assistance with care management and care coordination.   Follow Up Plan: A HIPPA compliant phone message was left for the patient providing contact information and requesting a return call.   [name] RN, MSN, [place] Mobile: [phone]

## 2019-08-18 ENCOUNTER — Ambulatory Visit: Payer: Medicare Other | Admitting: Podiatry

## 2019-08-29 ENCOUNTER — Ambulatory Visit (INDEPENDENT_AMBULATORY_CARE_PROVIDER_SITE_OTHER): Payer: Medicare Other | Admitting: Podiatry

## 2019-08-29 ENCOUNTER — Other Ambulatory Visit: Payer: Self-pay

## 2019-08-29 DIAGNOSIS — L97412 Non-pressure chronic ulcer of right heel and midfoot with fat layer exposed: Secondary | ICD-10-CM | POA: Diagnosis not present

## 2019-09-01 NOTE — Progress Notes (Signed)
   HPI: 85 y.o. male presenting today for follow up evaluation of an ulceration of the right heel. He states he is doing well. He believes the wound has now healed. He has been using the Gentamicin cream as directed. There are no worsening factors noted. Patient is here for further evaluation and treatment.   Past Medical History:  Diagnosis Date  . Bowel obstruction ([place])   . BPH (benign prostatic hypertrophy)   . Gastric outlet obstruction   . High cholesterol   . Hyperchloremia   . Hypertension   . Pancreatitis      Physical Exam: General: The patient is alert and oriented x3 in no acute distress.  Dermatology: Wound noted to the right heel has healed. Complete re-epithelialization has occurred. No drainage noted. Skin is warm, dry and supple bilateral lower extremities.   Vascular: Palpable pedal pulses bilaterally. No edema or erythema noted. Capillary refill within normal limits.  Neurological: Epicritic and protective threshold grossly intact bilaterally.   Musculoskeletal Exam: Range of motion within normal limits to all pedal and ankle joints bilateral. Muscle strength 5/5 in all groups bilateral.   Assessment: 1. Ulceration of the right heel - healed   Plan of Care:  1. Patient evaluated.  2. Light debridement of the wound performed with a tissue nipper without issue.  3. Continue wearing good shoe gear.  4. Return to clinic as needed.      Edrick Kins, DPM [facility]  Dr. Edrick Kins, DPM    [address], [place] [address]                Office [phone]  Fax [phone]

## 2019-09-04 ENCOUNTER — Telehealth: Payer: Self-pay

## 2019-09-04 ENCOUNTER — Ambulatory Visit: Payer: Self-pay | Admitting: General Practice

## 2019-09-04 NOTE — Chronic Care Management (AMB) (Signed)
  Chronic Care Management   Outreach Note  09/04/2019 Name: [name] MRN: [id number] DOB: 1934-09-10  Referred by: [name], DO Reason for referral : Chronic Care Management (Follow up on RNCM Chronic Disease Management and Care Coordination Needs- also status of foot ulcer)   An unsuccessful telephone outreach was attempted today. The patient was referred to the case management team for assistance with care management and care coordination.   Follow Up Plan: The care management team will reach out to the patient again over the next 30 days.   [name] RN, MSN, [facility] Mobile: [phone]

## 2019-10-05 ENCOUNTER — Ambulatory Visit: Payer: Self-pay

## 2019-10-05 NOTE — Chronic Care Management (AMB) (Signed)
  Care Management   Follow Up Note   10/05/2019 Name: [name] MRN: [id number] DOB: 08-07-1934  Referred by: [name], DO Reason for referral : Care Coordination   [name] is a 85 y.o. year old male who is a primary care patient of [name], DO. The care management team was consulted for assistance with care management and care coordination needs.    Review of patient status, including review of consultants reports, relevant laboratory and other test results, and collaboration with appropriate care team members and the patient's provider was performed as part of comprehensive patient evaluation and provision of chronic care management services.    [name] completed CCM outreach attempt today but was unable to reach patient successfully. A HIPPA compliant voice message was left encouraging patient to return call once available. [name] rescheduled CCM SW appointment as well.  A HIPPA compliant phone message was left for the patient providing contact information and requesting a return call.   Eula Fried, BSW, MSW, [email or web address] Phone: [phone]

## 2019-10-19 ENCOUNTER — Ambulatory Visit (INDEPENDENT_AMBULATORY_CARE_PROVIDER_SITE_OTHER): Payer: Medicare Other | Admitting: General Practice

## 2019-10-19 ENCOUNTER — Telehealth: Payer: Self-pay | Admitting: General Practice

## 2019-10-19 DIAGNOSIS — N183 Chronic kidney disease, stage 3 unspecified: Secondary | ICD-10-CM | POA: Diagnosis not present

## 2019-10-19 DIAGNOSIS — E782 Mixed hyperlipidemia: Secondary | ICD-10-CM | POA: Diagnosis not present

## 2019-10-19 DIAGNOSIS — I10 Essential (primary) hypertension: Secondary | ICD-10-CM | POA: Diagnosis not present

## 2019-10-19 NOTE — Chronic Care Management (AMB) (Signed)
Chronic Care Management   Follow Up Note   10/19/2019 Name: [name] MRN: [id number] DOB: July 21, 1934  Referred by: [name], DO Reason for referral : Chronic Care Management (RNCM Follow up: Chronic Disease Management and Care Coordination needs)   [name] is a 85 y.o. year old male who is a primary care patient of [name], DO. The CCM team was consulted for assistance with chronic disease management and care coordination needs.    Review of patient status, including review of consultants reports, relevant laboratory and other test results, and collaboration with appropriate care team members and the patient's provider was performed as part of comprehensive patient evaluation and provision of chronic care management services.    SDOH (Social Determinants of Health) assessments performed: Yes See Care Plan activities for detailed interventions related to [facility])     Outpatient Encounter Medications as of 10/19/2019  Medication Sig  . aspirin EC 81 MG tablet Take 81 mg by mouth daily.  Marland Kitchen atorvastatin (LIPITOR) 40 MG tablet TAKE 1 TABLET DAILY AT 6 P.M. (Patient not taking: Reported on 04/26/2019)  . clopidogrel (PLAVIX) 75 MG tablet TAKE 1 TABLET DAILY (Patient not taking: Reported on 04/26/2019)  . doxycycline (VIBRA-TABS) 100 MG tablet Take 1 tablet (100 mg total) by mouth 2 (two) times daily.  . fluticasone (FLONASE) 50 MCG/ACT nasal spray Place 2 sprays into both nostrils daily. Use for 4-6 weeks then stop and use seasonally or as needed.  Marland Kitchen gentamicin cream (GARAMYCIN) 0.1 % Apply 1 application topically 2 (two) times daily.  Marland Kitchen lisinopril (ZESTRIL) 20 MG tablet TAKE 1 TABLET DAILY  . loratadine (CLARITIN) 10 MG tablet Take 1 tablet (10 mg total) by mouth daily. Use for 4-6 weeks then stop, and use as needed or seasonally  . pantoprazole (PROTONIX) 40 MG tablet Take 1 tablet (40 mg total) by mouth daily before breakfast.  . terazosin (HYTRIN) 5 MG  capsule TAKE 1 CAPSULE AT BEDTIME  . triamcinolone cream (KENALOG) 0.5 % Apply 1 application topically 2 (two) times daily. To affected areas, for up to 2 weeks.   No facility-administered encounter medications on file as of 10/19/2019.     Objective:   BP Readings from Last 3 Encounters:  01/10/19 (!) 115/56  11/18/18 (!) 146/70  11/15/18 125/73   Goals Addressed              This Visit's Progress   .  RNCM: PT-"Something is wrong with my foot" (pt-stated)        CARE PLAN ENTRY (see longtitudinal plan of care for additional care plan information)  Current Barriers:  . Chronic Disease Management support, education, and care coordination needs related to HTN, HLD, and CKD Stage 3 . Memory issues . Limited social support . Safety concerns . Knowledge deficit related to area on his foot that is not healing  Clinical Goal(s) related to HTN, HLD, and CKD Stage 3 :  Over the next 120 days, patient will:  . Work with the care management team to address educational, disease management, and care coordination needs  . Begin or continue self health monitoring activities as directed today Measure and record blood pressure 3 times per week and adhere to a heart healthy diet . Call provider office for new or worsened signs and symptoms Blood pressure findings outside established parameters, New or worsened symptom related to HLD/CKD, and new issues related to non-healing area on his foot . Call care management team with  questions or concerns . Verbalize basic understanding of patient centered plan of care established today  Interventions related to HTN, HLD, and CKD Stage 3 :  . Evaluation of current treatment plans and patient's adherence to plan as established by provider.  The patient is adherent to the plan of care.  . Assessed patient understanding of disease states.  The patient verbalized understanding of his chronic conditions. The patient states that he is doing well. Taking his  blood pressure every other day and it is good. Does state that he doesn't have as much energy and needs more energy. Discussed ways to pace activity and take rest breaks when needed.  . Assessed patient's education and care coordination needs-the patient has an appointment on 3-18 with podiatrist to evaluate his foot.  10-19-2019: The patient states he is doing well and has no further issues with his foot. The patient denies any concerns related to his foot or other health problems.  . Provided disease specific education to patient- education and support on a heart healthy diet.  The patient states he eats well. Denies any issues with current diet.  10-19-2019: The patient admits that he could do better at watching his sodium and fats. Denies issues with elevated blood pressure takes every other day and records at home. Reminded the patient to watch his sodium content.  [name] with appropriate clinical care team members regarding patient needs- the patient is currently working with [name]. Knows resources are available  Patient Self Care Activities related to HTN, HLD, and CKD Stage 3 :  . Patient is unable to independently self-manage chronic health conditions  Please see past updates related to this goal by clicking on the "Past Updates" button in the selected goal          Plan:   The care management team will reach out to the patient again over the next 60 to 90 days.    [name] RN, MSN, [place] Mobile: [phone]

## 2019-10-19 NOTE — Patient Instructions (Signed)
Visit Information  Goals Addressed              This Visit's Progress   .  RNCM: PT-"Something is wrong with my foot" (pt-stated)        CARE PLAN ENTRY (see longtitudinal plan of care for additional care plan information)  Current Barriers:  . Chronic Disease Management support, education, and care coordination needs related to HTN, HLD, and CKD Stage 3 . Memory issues . Limited social support . Safety concerns . Knowledge deficit related to area on his foot that is not healing  Clinical Goal(s) related to HTN, HLD, and CKD Stage 3 :  Over the next 120 days, patient will:  . Work with the care management team to address educational, disease management, and care coordination needs  . Begin or continue self health monitoring activities as directed today Measure and record blood pressure 3 times per week and adhere to a heart healthy diet . Call provider office for new or worsened signs and symptoms Blood pressure findings outside established parameters, New or worsened symptom related to HLD/CKD, and new issues related to non-healing area on his foot . Call care management team with questions or concerns . Verbalize basic understanding of patient centered plan of care established today  Interventions related to HTN, HLD, and CKD Stage 3 :  . Evaluation of current treatment plans and patient's adherence to plan as established by provider.  The patient is adherent to the plan of care.  . Assessed patient understanding of disease states.  The patient verbalized understanding of his chronic conditions. The patient states that he is doing well. Taking his blood pressure every other day and it is good. Does state that he doesn't have as much energy and needs more energy. Discussed ways to pace activity and take rest breaks when needed.  . Assessed patient's education and care coordination needs-the patient has an appointment on 3-18 with podiatrist to evaluate his foot.  10-19-2019: The patient  states he is doing well and has no further issues with his foot. The patient denies any concerns related to his foot or other health problems.  . Provided disease specific education to patient- education and support on a heart healthy diet.  The patient states he eats well. Denies any issues with current diet.  10-19-2019: The patient admits that he could do better at watching his sodium and fats. Denies issues with elevated blood pressure takes every other day and records at home. Reminded the patient to watch his sodium content.  [name] with appropriate clinical care team members regarding patient needs- the patient is currently working with [name]. Knows resources are available  Patient Self Care Activities related to HTN, HLD, and CKD Stage 3 :  . Patient is unable to independently self-manage chronic health conditions  Please see past updates related to this goal by clicking on the "Past Updates" button in the selected goal         Patient verbalizes understanding of instructions provided today.   The care management team will reach out to the patient again over the next 60 to 90  days.   [name] RN, MSN, [place] Mobile: [phone]

## 2019-12-18 ENCOUNTER — Telehealth: Payer: Self-pay | Admitting: General Practice

## 2019-12-18 ENCOUNTER — Telehealth: Payer: Self-pay

## 2019-12-18 NOTE — Telephone Encounter (Signed)
  Chronic Care Management   Outreach Note  12/18/2019 Name: [name] MRN: [id number] DOB: Apr 23, 1934  Referred by: [name], DO Reason for referral : Chronic Care Management (RNCM Follow up Call for Chronic Disease Management and Care Coordination Needs.)   An unsuccessful telephone outreach was attempted today. The patient was referred to the case management team for assistance with care management and care coordination.   Follow Up Plan: A HIPPA compliant phone message was left for the patient providing contact information and requesting a return call.   [name] RN, MSN, [place] Mobile: [phone]

## 2019-12-20 ENCOUNTER — Telehealth: Payer: Self-pay

## 2019-12-20 NOTE — Chronic Care Management (AMB) (Signed)
  Care Management   Note  12/20/2019 Name: [name] MRN: [id number] DOB: Feb 09, 1935  [name] is a 85 y.o. year old male who is a primary care patient of [name], DO and is actively engaged with the care management team. I reached out to [name] by phone today to assist with re-scheduling a follow up visit with the RN Case Manager  Follow up plan: Unsuccessful telephone outreach attempt made. A HIPPA compliant phone message was left for the patient providing contact information and requesting a return call.  The care management team will reach out to the patient again over the next 7 days.  If patient returns call to provider office, please advise to call [facility] at [place], [place], [place], [place] [address] Direct Dial: [phone] [email or web address] Website: [email or web address]

## 2019-12-28 ENCOUNTER — Ambulatory Visit: Payer: Medicare Other | Admitting: Licensed Clinical Social Worker

## 2019-12-28 DIAGNOSIS — I129 Hypertensive chronic kidney disease with stage 1 through stage 4 chronic kidney disease, or unspecified chronic kidney disease: Secondary | ICD-10-CM

## 2019-12-28 DIAGNOSIS — N183 Chronic kidney disease, stage 3 unspecified: Secondary | ICD-10-CM

## 2019-12-28 DIAGNOSIS — E782 Mixed hyperlipidemia: Secondary | ICD-10-CM

## 2019-12-28 DIAGNOSIS — I10 Essential (primary) hypertension: Secondary | ICD-10-CM

## 2019-12-28 NOTE — Chronic Care Management (AMB) (Signed)
Chronic Care Management    Clinical Social Work Follow Up Note  12/28/2019 Name: [name] MRN: [id number] DOB: 08/14/1934  [name] is a 85 y.o. year old male who is a primary care patient of [name], DO. The CCM team was consulted for assistance with [employer]  and Level of Care Concerns.   Review of patient status, including review of consultants reports, other relevant assessments, and collaboration with appropriate care team members and the patient's provider was performed as part of comprehensive patient evaluation and provision of chronic care management services.    SDOH (Social Determinants of Health) assessments performed: Yes    Outpatient Encounter Medications as of 12/28/2019  Medication Sig  . aspirin EC 81 MG tablet Take 81 mg by mouth daily.  Marland Kitchen atorvastatin (LIPITOR) 40 MG tablet TAKE 1 TABLET DAILY AT 6 P.M. (Patient not taking: Reported on 04/26/2019)  . clopidogrel (PLAVIX) 75 MG tablet TAKE 1 TABLET DAILY (Patient not taking: Reported on 04/26/2019)  . doxycycline (VIBRA-TABS) 100 MG tablet Take 1 tablet (100 mg total) by mouth 2 (two) times daily.  . fluticasone (FLONASE) 50 MCG/ACT nasal spray Place 2 sprays into both nostrils daily. Use for 4-6 weeks then stop and use seasonally or as needed.  Marland Kitchen gentamicin cream (GARAMYCIN) 0.1 % Apply 1 application topically 2 (two) times daily.  Marland Kitchen lisinopril (ZESTRIL) 20 MG tablet TAKE 1 TABLET DAILY  . loratadine (CLARITIN) 10 MG tablet Take 1 tablet (10 mg total) by mouth daily. Use for 4-6 weeks then stop, and use as needed or seasonally  . pantoprazole (PROTONIX) 40 MG tablet Take 1 tablet (40 mg total) by mouth daily before breakfast.  . terazosin (HYTRIN) 5 MG capsule TAKE 1 CAPSULE AT BEDTIME  . triamcinolone cream (KENALOG) 0.5 % Apply 1 application topically 2 (two) times daily. To affected areas, for up to 2 weeks.   No facility-administered encounter medications on file as of 12/28/2019.       Goals Addressed    .  COMPLETED: "I need help with caregiving in the home" (pt-stated)        Current Barriers:  . Limited social support . ADL IADL limitations . Social Isolation . Limited access to caregiver . Inability to perform ADL's independently . Inability to perform IADL's independently . Lacks knowledge of community resource: personal care services  Clinical Social Work Clinical Goal(s):  Marland Kitchen Over the next 120 days, patient will work with SW to address concerns related to lack of care within the home  Interventions: . Patient interviewed and appropriate assessments performed . Provided patient with information about available personal care services within the area such as C.H.O.R.E program. Patient feels that he will need be eligible for Medicaid. . Discussed plans with patient for ongoing care management follow up and provided patient with direct contact information for care management team . Advised patient to considering hiring a private pay caregiver as Medicare does not cover these services . Assisted patient/caregiver with obtaining information about health plan benefits . Provided education and assistance to client regarding Advanced Directives. . Provided education to patient/caregiver regarding level of care options. . Provided education to patient/caregiver about Hospice and/or Palliative Care services . UPDATE--[name] will place patient on wait list for C.H.O.R.E program during next scheduled CCM appointment---Application completed and patient was successfully added on the wait list for C.H.O.R.E program on 08/08/19. Patient was provided this update on 12/28/19. Patient was provided Home Care Providers contact information in the  case that he wishes to inquire where he is at on the wait list.  . Patient reports no further social work needs and is agreeable to social work case closure. . Patient shares that he wishes to speak to PCP regarding something he is only comfortable  talking about with a male. [name] expressed understanding and encouraged patient to contact [facility] to schedule appointment regarding this concern.   Patient Self Care Activities:  . Attends all scheduled provider appointments . Calls provider office for new concerns or questions . Lacks social connections . Unable to perform ADLs independently . Unable to perform IADLs independently  Please see past updates related to this goal by clicking on the "Past Updates" button in the selected goal       Follow Up Plan: Patient will contact CCM [name] directly if any future social work needs arise.  Eula Fried, BSW, MSW, [email or web address] Phone: [phone]

## 2020-01-02 NOTE — Chronic Care Management (AMB) (Signed)
°  Care Management   Note  01/02/2020 Name: [name] MRN: [id number] DOB: Nov 03, 1934  [name] is a 85 y.o. year old male who is a primary care patient of [name], DO and is actively engaged with the care management team. I reached out to [name] by phone today to assist with re-scheduling a follow up visit with the RN Case Manager  Follow up plan: The care management team will reach out to the patient again over the next 7 days.  If patient returns call to provider office, please advise to call [facility]  at [place], [place], [place], [place] [address] Direct Dial: [phone] [email or web address] Website: [email or web address]

## 2020-01-10 NOTE — Telephone Encounter (Signed)
3rd unsuccessful outreach  

## 2020-01-10 NOTE — Chronic Care Management (AMB) (Signed)
  Care Management   Note  01/10/2020 Name: TREVER STREATER MRN: [id number] DOB: Feb 08, 1935  [name] is a 85 y.o. year old male who is a primary care patient of [name], DO and is actively engaged with the care management team. I reached out to [name] by phone today to assist with re-scheduling a follow up visit with the RN Case Manager  Follow up plan: Unable to make contact on outreach attempts x 3. PCP Dr. Parks Ranger and Dellie Catholic RN CM  notified via routed documentation in medical record.   [name], [place], [place], [place] [address] Direct Dial: [phone] [email or web address] Website: [email or web address]

## 2020-01-15 ENCOUNTER — Telehealth: Payer: Self-pay | Admitting: Family Medicine

## 2020-01-15 NOTE — Telephone Encounter (Signed)
I called the patient to schedule AWV, but there was no answer and no option to leave a message because the voicemail hasn't been set up yet. VDM (DD)

## 2020-05-23 ENCOUNTER — Ambulatory Visit
Admission: EM | Admit: 2020-05-23 | Discharge: 2020-05-23 | Disposition: A | Discharge status: Home / Self Care | Payer: Medicare Other | Attending: Emergency Medicine | Admitting: Emergency Medicine

## 2020-05-23 ENCOUNTER — Other Ambulatory Visit: Payer: Self-pay

## 2020-05-23 ENCOUNTER — Ambulatory Visit (INDEPENDENT_AMBULATORY_CARE_PROVIDER_SITE_OTHER): Payer: Medicare Other

## 2020-05-23 DIAGNOSIS — I6930 Unspecified sequelae of cerebral infarction: Secondary | ICD-10-CM

## 2020-05-23 DIAGNOSIS — E782 Mixed hyperlipidemia: Secondary | ICD-10-CM

## 2020-05-23 DIAGNOSIS — M545 Low back pain, unspecified: Secondary | ICD-10-CM | POA: Diagnosis not present

## 2020-05-23 DIAGNOSIS — M47816 Spondylosis without myelopathy or radiculopathy, lumbar region: Secondary | ICD-10-CM

## 2020-05-23 DIAGNOSIS — R202 Paresthesia of skin: Secondary | ICD-10-CM | POA: Diagnosis not present

## 2020-05-23 MED ORDER — ATORVASTATIN CALCIUM 40 MG PO TABS: ORAL_TABLET | ORAL | 2 refills | Status: DC

## 2020-05-23 MED ORDER — ASPIRIN EC 81 MG PO TBEC: 81.0000 mg | DELAYED_RELEASE_TABLET | Freq: Every day | ORAL | 2 refills | Status: DC

## 2020-05-23 MED ORDER — CLOPIDOGREL BISULFATE 75 MG PO TABS: 75.0000 mg | ORAL_TABLET | Freq: Every day | ORAL | 2 refills | Status: DC

## 2020-05-23 NOTE — ED Provider Notes (Signed)
[facility]    CSN: [id number] Arrival date & time: 05/23/20  1030      History   Chief Complaint Chief Complaint  Patient presents with  . Leg Pain    HPI [name] is a 86 y.o. male.   HPI   Patient is an 86 year old male here for evaluation of tingling in his right leg x3 to 4 months.  Patient reports that he has had tingling from his right hip all the way down to his entire foot for last 3 to 4 months.  In the last 2 months he has developed a constant needle pain sensation in his right leg.  He has not taken anything for this pain and has not seen anybody about this.  He states that it does calm down enough for him to get sleep at night.  Patient does report that he has had multiple falls and that his right leg will give out on him.  Patient denies using a cane or a walker for stability.  Additionally, patient reports that he is not taking any medications at present.  He had been receiving his medications in the mail through [facility] but they stopped coming 6 months ago or better.  Patient states that he is not called to inquire about his medications.  Patient is usually seen by Dr. Parks Ranger at [facility] and reports that he has not seen him in over a year.   Past Medical History:  Diagnosis Date  . Bowel obstruction ([place])   . BPH (benign prostatic hypertrophy)   . Gastric outlet obstruction   . High cholesterol   . Hyperchloremia   . Hypertension   . Pancreatitis     Patient Active Problem List   Diagnosis Date Noted  . Imbalance 02/01/2018  . Humeral head fracture, left, closed, initial encounter 09/22/2017  . History of cerebrovascular accident (CVA) with residual deficit 09/09/2017  . CKD (chronic kidney disease), stage III ([place]) 09/01/2017  . Elevated PSA, between 10 and less than 20 ng/ml 07/22/2016  . Bilateral lower extremity edema 07/22/2016  . Abnormal glucose 07/15/2016  . BPH with obstruction/lower urinary tract symptoms  04/10/2016  . Benign hypertension with CKD (chronic kidney disease) stage III ([place]) 04/10/2016  . Memory loss 04/10/2016  . Gastric outlet obstruction 12/02/2014  . Generalized abdominal pain   . Benign fibroma of prostate 01/02/2011  . HLD (hyperlipidemia) 01/02/2011  . Obesity (BMI 30.0-34.9) 01/02/2011    Past Surgical History:  Procedure Laterality Date  . ESOPHAGOGASTRODUODENOSCOPY N/A 12/03/2014   Procedure: ESOPHAGOGASTRODUODENOSCOPY (EGD);  Surgeon: Hulen Luster, MD;  Location: [facility] ENDOSCOPY;  Service: Endoscopy;  Laterality: N/A;       Home Medications    Prior to Admission medications   Medication Sig Start Date End Date Taking? Authorizing Provider  aspirin EC 81 MG tablet Take 1 tablet (81 mg total) by mouth daily. 05/23/20   Margarette Canada, NP  atorvastatin (LIPITOR) 40 MG tablet TAKE 1 TABLET DAILY AT 6 P.M. 05/23/20   Margarette Canada, NP  clopidogrel (PLAVIX) 75 MG tablet Take 1 tablet (75 mg total) by mouth daily. 05/23/20   Margarette Canada, NP  doxycycline (VIBRA-TABS) 100 MG tablet Take 1 tablet (100 mg total) by mouth 2 (two) times daily. 07/04/19   Edrick Kins, DPM  fluticasone (FLONASE) 50 MCG/ACT nasal spray Place 2 sprays into both nostrils daily. Use for 4-6 weeks then stop and use seasonally or as needed. 01/06/18   Karamalegos,  Devonne Doughty, DO  gentamicin cream (GARAMYCIN) 0.1 % Apply 1 application topically 2 (two) times daily. 07/04/19   Edrick Kins, DPM  lisinopril (ZESTRIL) 20 MG tablet TAKE 1 TABLET DAILY 09/19/18   Karamalegos, Devonne Doughty, DO  loratadine (CLARITIN) 10 MG tablet Take 1 tablet (10 mg total) by mouth daily. Use for 4-6 weeks then stop, and use as needed or seasonally 10/18/18   Parks Ranger, Devonne Doughty, DO  pantoprazole (PROTONIX) 40 MG tablet Take 1 tablet (40 mg total) by mouth daily before breakfast. 01/10/19   Lin Landsman, MD  terazosin (HYTRIN) 5 MG capsule TAKE 1 CAPSULE AT BEDTIME 12/28/18   Karamalegos, Devonne Doughty, DO  triamcinolone cream  (KENALOG) 0.5 % Apply 1 application topically 2 (two) times daily. To affected areas, for up to 2 weeks. 10/18/18   [name], DO    Family History Family History  Problem Relation Age of Onset  . Heart disease Mother   . Heart disease Father   . Heart disease Brother     Social History Social History   Tobacco Use  . Smoking status: Former Smoker    Quit date: 1985    Years since quitting: 37.1  . Smokeless tobacco: Former Network engineer  . Vaping Use: Never used  Substance Use Topics  . Alcohol use: No  . Drug use: No     Allergies   Patient has no known allergies.   Review of Systems Review of Systems  Constitutional: Negative for activity change, appetite change and fever.  Respiratory: Negative for shortness of breath and wheezing.   Cardiovascular: Positive for leg swelling. Negative for chest pain.  Neurological: Positive for numbness. Negative for dizziness, facial asymmetry, speech difficulty and headaches.     Physical Exam Triage Vital Signs ED Triage Vitals  Enc Vitals Group     BP 05/23/20 1111 130/85     Pulse Rate 05/23/20 1111 70     Resp 05/23/20 1111 18     Temp 05/23/20 1111 98 F (36.7 C)     Temp Source 05/23/20 1111 Oral     SpO2 05/23/20 1111 97 %     Weight 05/23/20 1108 206 lb (93.4 kg)     Height 05/23/20 1108 5\' 11"  (1.803 m)     Head Circumference --      Peak Flow --      Pain Score 05/23/20 1108 4     Pain Loc --      Pain Edu? --      Excl. in [place]? --    No data found.  Updated Vital Signs BP 130/85 (BP Location: Right Arm)   Pulse 70   Temp 98 F (36.7 C) (Oral)   Resp 18   Ht 5\' 11"  (1.803 m)   Wt 206 lb (93.4 kg)   SpO2 97%   BMI 28.73 kg/m   Visual Acuity Right Eye Distance:   Left Eye Distance:   Bilateral Distance:    Right Eye Near:   Left Eye Near:    Bilateral Near:     Physical Exam Vitals and nursing note reviewed.  Constitutional:      General: He is not in acute distress.     Appearance: Normal appearance. He is not ill-appearing.  HENT:     Head: Normocephalic and atraumatic.  Cardiovascular:     Rate and Rhythm: Normal rate and regular rhythm.     Pulses: Normal pulses.  Heart sounds: Normal heart sounds. No murmur heard. No gallop.   Pulmonary:     Effort: Pulmonary effort is normal.     Breath sounds: Normal breath sounds. No wheezing or rales.  Musculoskeletal:        General: Swelling present. No tenderness, deformity or signs of injury.  Skin:    General: Skin is warm and dry.     Capillary Refill: Capillary refill takes less than 2 seconds.     Findings: No erythema.  Neurological:     General: No focal deficit present.     Mental Status: He is alert and oriented to person, place, and time.     Motor: No weakness.     Deep Tendon Reflexes: Reflexes normal.  Psychiatric:        Mood and Affect: Mood normal.        Behavior: Behavior normal.        Thought Content: Thought content normal.        Judgment: Judgment normal.      UC Treatments / Results  Labs (all labs ordered are listed, but only abnormal results are displayed) Labs Reviewed - No data to display  EKG   Radiology DG Lumbar Spine Complete  Result Date: 05/23/2020 CLINICAL DATA:  Back and hip pain, tingling and needle Ling sensation in right leg for 3-4 months EXAM: LUMBAR SPINE - COMPLETE 4+ VIEW COMPARISON:  None. FINDINGS: Osteopenia. No fracture or dislocation of the lumbar spine. Mild multilevel disc space height loss and osteophytosis of the lower lumbar levels, focally moderate at L5-S1. Mild to moderate facet degenerative disease of the lower lumbar spine. Nonobstructive pattern of overlying bowel gas. IMPRESSION: No fracture or dislocation of the lumbar spine. Mild multilevel disc space height loss and osteophytosis of the lower lumbar levels, focally moderate at L5-S1. Mild to moderate facet degenerative disease of the lower lumbar spine. Lumbar disc and neural  foraminal pathology may be further evaluated by MRI if indicated by neurologically localizing signs and symptoms. Electronically Signed   By: Eddie Candle M.D.   On: 05/23/2020 12:25    Procedures Procedures (including critical care time)  Medications Ordered in UC Medications - No data to display  Initial Impression / Assessment and Plan / UC Course  I have reviewed the triage vital signs and the nursing notes.  Pertinent labs & imaging results that were available during my care of the patient were reviewed by me and considered in my medical decision making (see chart for details).  Clinical Course as of 05/23/20 1240  Thu May 23, 2020  1229 DG Lumbar Spine Complete [MH]    Clinical Course User Index [MH] Luvenia Redden, PA-C  Patient is here for evaluation of tingling in his right leg for last 3 to 4 months and a intense needle pinprick sensation for the last 2 months.  Patient has 2+ DTRs of patella and Achilles.  Lower extremity strength is 5/5 and equal bilaterally.  Patient denies any low back pain and has no pain to palpation.  Patient does have 1+ pitting edema in his lower extremities from ankles to knees.  Patient states that he had noticed this.  No calf tenderness to palpation and negative Homans' sign.  Able falls.  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  He attended PT and OT in 2019 for rehab of this and was started on aspirin and Plavix for prophylaxis.  Patient  has not been taking either 1 of those medications for at least 6 months.  Patient also had an evaluation by neurology January 24, 2018 by Dr. Melrose Nakayama at [facility] for balance issues.  Patient states he is not using a cane currently.  Patient's last confirmed office visit was for visit on 04/26/2019.  Will image patient's lumbar spine.  Plan to restart patient on his atorvastatin, Plavix, and baby aspirin.  Radiology interpretation of lumbar spine films are as  follows: No fracture or dislocation of the lumbar spine. Mild multilevel disc space height loss and osteophytosis of the lower lumbar levels, focally moderate at L5-S1. Mild to moderate facet degenerative disease of the lower lumbar spine. Lumbar disc and neural foraminal pathology may be further evaluated by MRI if indicated by neurologically localizing signs and symptoms.  Suspect patient's degenerative disease of his lumbar spine is contributing to the tingling and pain sensation of his right leg, and possibly his gait issues.  We will have patient follow-up with his primary care provider regarding his chronic medications and will refer to neurology for further examination and more complex imaging.   Final Clinical Impressions(s) / UC Diagnoses   Final diagnoses:  Osteoarthritis of lumbar spine, unspecified spinal osteoarthritis complication status     Discharge Instructions     Your x-rays today showed some degeneration which could be putting pressure on the nerves and causing the tingling and pins-and-needles sensation in your right leg.  This could also be causing her leg to give out and attributing to your falls.  I have given you the number for Dr. Lamount Cranker up above who is a spinal specialist with [facility] in [place].  Please give him a call to schedule appointment to further discuss your case.  I have sent prescription to the pharmacy for your atorvastatin, Plavix, and baby aspirin.  When she to resume these medications for stroke prevention.  Please reach out to Dr. Parks Ranger office and schedule an appointment to follow-up regarding your chronic medical conditions.    ED Prescriptions    Medication Sig Dispense Auth. Provider   aspirin EC 81 MG tablet Take 1 tablet (81 mg total) by mouth daily. 30 tablet Margarette Canada, NP   atorvastatin (LIPITOR) 40 MG tablet TAKE 1 TABLET DAILY AT 6 P.M. 30 tablet Margarette Canada, NP   clopidogrel (PLAVIX) 75 MG tablet Take 1  tablet (75 mg total) by mouth daily. 30 tablet Margarette Canada, NP     PDMP not reviewed this encounter.   Margarette Canada, NP 05/23/20 1241

## 2020-05-23 NOTE — ED Triage Notes (Signed)
Patient states that he has tingling in his right leg. States that it starts in his hip and radiates all the way down. Reports that it feels like he is being stuck with needles x 3-4 months.

## 2020-05-23 NOTE — Discharge Instructions (Addendum)
Your x-rays today showed some degeneration which could be putting pressure on the nerves and causing the tingling and pins-and-needles sensation in your right leg.  This could also be causing her leg to give out and attributing to your falls.  I have given you the number for Dr. Lamount Cranker up above who is a spinal specialist with [facility] in [place].  Please give him a call to schedule appointment to further discuss your case.  I have sent prescription to the pharmacy for your atorvastatin, Plavix, and baby aspirin.  When she to resume these medications for stroke prevention.  Please reach out to Dr. Parks Ranger office and schedule an appointment to follow-up regarding your chronic medical conditions.

## 2020-06-03 ENCOUNTER — Ambulatory Visit
Admission: EM | Admit: 2020-06-03 | Discharge: 2020-06-03 | Disposition: A | Discharge status: Home / Self Care | Payer: Medicare Other | Attending: Internal Medicine | Admitting: Internal Medicine

## 2020-06-03 ENCOUNTER — Encounter: Payer: Medicare Other | Attending: Physician Assistant | Admitting: Physician Assistant

## 2020-06-03 ENCOUNTER — Other Ambulatory Visit: Payer: Self-pay

## 2020-06-03 ENCOUNTER — Encounter: Payer: Self-pay | Admitting: Emergency Medicine

## 2020-06-03 DIAGNOSIS — L89610 Pressure ulcer of right heel, unstageable: Secondary | ICD-10-CM | POA: Diagnosis not present

## 2020-06-03 DIAGNOSIS — L03115 Cellulitis of right lower limb: Secondary | ICD-10-CM

## 2020-06-03 DIAGNOSIS — Z7901 Long term (current) use of anticoagulants: Secondary | ICD-10-CM | POA: Insufficient documentation

## 2020-06-03 DIAGNOSIS — L97513 Non-pressure chronic ulcer of other part of right foot with necrosis of muscle: Secondary | ICD-10-CM

## 2020-06-03 DIAGNOSIS — L89890 Pressure ulcer of other site, unstageable: Secondary | ICD-10-CM | POA: Diagnosis not present

## 2020-06-03 MED ORDER — DOXYCYCLINE HYCLATE 100 MG PO CAPS: 100.0000 mg | ORAL_CAPSULE | Freq: Two times a day (BID) | ORAL | 0 refills | Status: DC

## 2020-06-03 NOTE — Discharge Instructions (Addendum)
You have a bad infection of your right foot and it looks like the muscle is dying and I am concerned the infection may be moving to your bone.The wound clinic provider Margarita Grizzle will see you today. Be there by 12:30, your appointment is at 1 pm. He may have to send you to the ER if it cant be cared there.

## 2020-06-03 NOTE — ED Notes (Signed)
Patient is being discharged from the Urgent Care and sent to the Emergency Department via POV . Per Marga Hoots, patient is in need of higher level of care due to ulcer on the bottom of his right heel. Patient is aware and verbalizes understanding of plan of care.  Vitals:   06/03/20 1101  BP: 126/80  Pulse: 66  Resp: 18  Temp: 98.1 F (36.7 C)  SpO2: 100%

## 2020-06-03 NOTE — Progress Notes (Signed)
[name], [name] ([id number]) Visit Report for 06/03/2020 Abuse/Suicide Risk Screen Details [name], [name] Date of Service: 06/03/2020 1:00 PM Medical Record Number: [id number] Patient Account Number: [id number] Date of Birth/Sex: [birth year] (85 y.o. M) Treating RN: [name] Primary Care Cyle Kenyon: Nobie Putnam Other Clinician: Referring Jonavan Vanhorn: Shelby Mattocks Treating Johnryan Sao/Extender: Skipper Cliche in Treatment: 0 Abuse/Suicide Risk Screen Items Answer ABUSE RISK SCREEN: Has anyone close to you tried to hurt or harm you recentlyo No Do you feel uncomfortable with anyone in your familyo No Has anyone forced you do things that you didnot want to doo No Electronic Signature(s) Signed: 06/03/2020 4:29:15 PM By: Gretta Cool, BSN, RN, CWS, Kim RN, BSN Entered By: Gretta Cool, BSN, RN, CWS, Kim on 06/03/2020 12:51:23 Lenzo, [name] ([id number]) -------------------------------------------------------------------------------- Activities of Daily Living Details [name], [name] Date of Service: 06/03/2020 1:00 PM Medical Record Number: [id number] Patient Account Number: [id number] Date of Birth/Sex: 1934/07/14 (85 y.o. M) Treating RN: [name] Primary Care Marisela Line: Nobie Putnam Other Clinician: Referring Miyah Hampshire: Shelby Mattocks Treating Miaya Lafontant/Extender: Skipper Cliche in Treatment: 0 Activities of Daily Living Items Answer Activities of Daily Living (Please select one for each item) Drive Automobile Completely Able Take Medications Need Assistance Use Telephone Completely Able Care for Appearance Completely Able Use Toilet Completely Able Bath / Shower Completely Able Dress Self Completely Able Feed Self Completely Able Walk Completely Able Get In / Out Bed Completely Able Housework Completely Able Prepare Meals Completely [place]ier for Self Completely Able Electronic  Signature(s) Signed: 06/03/2020 4:29:15 PM By: Gretta Cool, BSN, RN, CWS, Kim RN, BSN Entered By: Gretta Cool, BSN, RN, CWS, Kim on 06/03/2020 12:51:45 Musgrave, [name] ([id number]) -------------------------------------------------------------------------------- Education Screening Details [name] Date of Service: 06/03/2020 1:00 PM Medical Record Number: [id number] Patient Account Number: [id number] Date of Birth/Sex: [birth year] (85 y.o. M) Treating RN: [name] Primary Care Yeraldy Spike: Nobie Putnam Other Clinician: Referring Morna Flud: Shelby Mattocks Treating Diallo Ponder/Extender: Skipper Cliche in Treatment: 0 Primary Learner Assessed: Patient Learning Preferences/Education Level/Primary Language Learning Preference: Explanation Highest Education Level: College or Above Preferred Language: English Cognitive Barrier Language Barrier: No Translator Needed: No Memory Deficit: No Emotional Barrier: No Physical Barrier Impaired Vision: No Impaired Hearing: No Decreased Hand dexterity: No Knowledge/Comprehension Knowledge Level: High Comprehension Level: High Ability to understand written instructions: High Ability to understand verbal instructions: High Motivation Anxiety Level: Calm Cooperation: Cooperative Education Importance: Acknowledges Need Interest in Health Problems: Asks Questions Perception: Coherent Willingness to Engage in Self-Management High Activities: Readiness to Engage in Self-Management High Activities: Electronic Signature(s) Signed: 06/03/2020 4:29:15 PM By: Gretta Cool, BSN, RN, CWS, Kim RN, BSN Entered By: Gretta Cool, BSN, RN, CWS, Kim on 06/03/2020 12:52:30 SHAFTER, JUPIN ([id number]) -------------------------------------------------------------------------------- Fall Risk Assessment Details [name] Date of Service: 06/03/2020 1:00 PM Medical Record Number: [id number] Patient Account Number: [id number] Date  of Birth/Sex: 12-20-1934 (85 y.o. M) Treating RN: [name] Primary Care Nathyn Luiz: Nobie Putnam Other Clinician: Referring Lisia Westbay: Shelby Mattocks Treating Latiesha Harada/Extender: Skipper Cliche in Treatment: 0 Fall Risk Assessment Items Have you had 2 or more falls in the last 12 monthso 0 Yes Have you had any fall that resulted in injury in the last 12 monthso 0 No FALLS RISK SCREEN History of falling - immediate or within 3 months 25 Yes Secondary diagnosis (Do you have 2 or more medical diagnoseso) 0 No Ambulatory aid None/bed rest/wheelchair/nurse 0 No Crutches/cane/walker 0 No Furniture 30 Yes Intravenous therapy  Access/Saline/Heparin Lock 0 No Gait/Transferring Normal/ bed rest/ wheelchair 0 No Weak (short steps with or without shuffle, stooped but able to lift head while walking, may 10 Yes seek support from furniture) Impaired (short steps with shuffle, may have difficulty arising from chair, head down, impaired 0 No balance) Mental Status Oriented to own ability 0 No Notes Patient states he has had multiple falls in the past year. Has a cane but does not use it. Electronic Signature(s) Signed: 06/03/2020 1:15:59 PM By: Gretta Cool, BSN, RN, CWS, Kim RN, BSN Entered By: Gretta Cool, BSN, RN, CWS, Kim on 06/03/2020 13:15:58 Renk, [name] ([id number]) -------------------------------------------------------------------------------- Foot Assessment Details [name], [name] Date of Service: 06/03/2020 1:00 PM Medical Record Number: [id number] Patient Account Number: [id number] Date of Birth/Sex: 04-Aug-1934 (85 y.o. M) Treating RN: [name] Primary Care Danaiya Steadman: Nobie Putnam Other Clinician: Referring Layani Foronda: Shelby Mattocks Treating Harveer Sadler/Extender: Skipper Cliche in Treatment: 0 Foot Assessment Items Site Locations + = Sensation present, - = Sensation absent, C = Callus, U = Ulcer R = Redness, W = Warmth, M =  Maceration, PU = Pre-ulcerative lesion F = Fissure, S = Swelling, D = Dryness Assessment Right: Left: Other Deformity: No No Prior Foot Ulcer: No No Prior Amputation: No No Charcot Joint: No No Ambulatory Status: Ambulatory Without Help Gait: Steady Electronic Signature(s) Signed: 06/03/2020 4:29:15 PM By: Gretta Cool, BSN, RN, CWS, Kim RN, BSN Entered By: Gretta Cool, BSN, RN, CWS, Kim on 06/03/2020 12:55:35 Cotterill, [name] ([id number]) -------------------------------------------------------------------------------- Nutrition Risk Screening Details [name] Date of Service: 06/03/2020 1:00 PM Medical Record Number: [id number] Patient Account Number: [id number] Date of Birth/Sex: 21-Aug-1934 (85 y.o. M) Treating RN: [name] Primary Care Naavya Postma: Nobie Putnam Other Clinician: Referring Chekesha Behlke: Shelby Mattocks Treating Hevin Jeffcoat/Extender: Skipper Cliche in Treatment: 0 Height (in): 71 Weight (lbs): 216 Body Mass Index (BMI): 30.1 Nutrition Risk Screening Items Score Screening NUTRITION RISK SCREEN: I have an illness or condition that made me change the kind and/or amount of food I eat 0 No I eat fewer than two meals per day 0 No I eat few fruits and vegetables, or milk products 2 Yes I have three or more drinks of beer, liquor or wine almost every day 0 No I have tooth or mouth problems that make it hard for me to eat 0 No I don't always have enough money to buy the food I need 0 No I eat alone most of the time 0 No I take three or more different prescribed or over-the-counter drugs a day 0 No Without wanting to, I have lost or gained 10 pounds in the last six months 0 No I am not always physically able to shop, cook and/or feed myself 0 No Nutrition Protocols Good Risk Protocol 0 No interventions needed Moderate Risk Protocol High Risk Proctocol Risk Level: Good Risk Score: 2 Electronic Signature(s) Signed: 06/03/2020 4:29:15 PM By: Gretta Cool,  BSN, RN, CWS, Kim RN, BSN Entered By: Gretta Cool, BSN, RN, CWS, Kim on 06/03/2020 12:53:42

## 2020-06-03 NOTE — ED Triage Notes (Signed)
Patient c/o ulcer to his right heel. He states this has been there for about one month. He denies pain.

## 2020-06-03 NOTE — Progress Notes (Signed)
EUCLID, CASSETTA ([id number]) Visit Report for 06/03/2020 Allergy List Details [name], [name] Date of Service: 06/03/2020 1:00 PM Medical Record Number: [id number] Patient Account Number: [id number] Date of Birth/Sex: 08/06/1934 (85 y.o. M) Treating RN: [name] Primary Care Liddie Chichester: Nobie Putnam Other Clinician: Referring Zenaya Ulatowski: Shelby Mattocks Treating Suanne Minahan/Extender: Skipper Cliche in Treatment: 0 Allergies Active Allergies No Known Drug Intolerances Type: Allergen Allergy Notes Electronic Signature(s) Signed: 06/03/2020 4:29:15 PM By: Gretta Cool, BSN, RN, CWS, Kim RN, BSN Entered By: Gretta Cool, BSN, RN, CWS, Kim on 06/03/2020 12:45:43 Lievanos, [name] ([id number]) -------------------------------------------------------------------------------- Arrival Information Details [name] Date of Service: 06/03/2020 1:00 PM Medical Record Number: [id number] Patient Account Number: [id number] Date of Birth/Sex: [birth year] (85 y.o. M) Treating RN: [name] Primary Care November Sypher: Nobie Putnam Other Clinician: Referring Hyman Crossan: Shelby Mattocks Treating Shane Badeaux/Extender: Skipper Cliche in Treatment: 0 Visit Information Patient Arrived: Ambulatory Arrival Time: 12:40 Accompanied By: wife in lobby Transfer Assistance: None Patient Identification Verified: Yes Secondary Verification Process Completed: Yes Patient Has Alerts: Yes Patient Alerts: Not Diabetic Plavix Electronic Signature(s) Signed: 06/03/2020 4:29:15 PM By: Gretta Cool, BSN, RN, CWS, Kim RN, BSN Entered By: Gretta Cool, BSN, RN, CWS, Kim on 06/03/2020 12:42:06 Novosad, [name] ([id number]) -------------------------------------------------------------------------------- Lower Extremity Assessment Details [name] Date of Service: 06/03/2020 1:00 PM Medical Record Number: [id number] Patient Account Number: [id number] Date of Birth/Sex:  Jun 13, 1934 (85 y.o. M) Treating RN: [name] Primary Care Naje Rice: Nobie Putnam Other Clinician: Referring Samuella Rasool: Shelby Mattocks Treating Nomar Broad/Extender: Skipper Cliche in Treatment: 0 Edema Assessment Assessed: Shirlyn Goltz: Yes] [Right: Yes] Edema: [Left: No] [Right: Yes] Calf Left: Right: Point of Measurement: 31 cm From Medial Instep 37 cm 28 cm Ankle Left: Right: Point of Measurement: 12 cm From Medial Instep 23.5 cm 37.2 cm Vascular Assessment Pulses: Dorsalis Pedis Palpable: [Left:Yes] [Right:Yes] Doppler Audible: [Left:Yes] [Right:Yes] Posterior Tibial Palpable: [Left:Yes] [Right:Yes] Doppler Audible: [Left:Yes] Blood Pressure: Brachial: [Right:180] Dorsalis Pedis: 200 Ankle: Posterior Tibial: 206 Ankle Brachial Index: [Right:1.14] Electronic Signature(s) Signed: 06/03/2020 1:13:21 PM By: Gretta Cool, BSN, RN, CWS, Kim RN, BSN Entered By: Gretta Cool, BSN, RN, CWS, Kim on 06/03/2020 13:13:21 Navia, [name] ([id number]) -------------------------------------------------------------------------------- Multi Wound Chart Details [name] Date of Service: 06/03/2020 1:00 PM Medical Record Number: [id number] Patient Account Number: [id number] Date of Birth/Sex: 1934/05/15 (85 y.o. M) Treating RN: Carlene Coria Primary Care Joia Doyle: Nobie Putnam Other Clinician: Referring Destany Severns: Shelby Mattocks Treating Khanh Cordner/Extender: Skipper Cliche in Treatment: 0 Vital Signs Height(in): 71 Pulse(bpm): 63 Weight(lbs): 216 Blood Pressure(mmHg): 167/84 Body Mass Index(BMI): 30 Temperature(F): 97.7 Respiratory Rate(breaths/min): 18 Photos: [N/A:N/A] Wound Location: Right, Lateral Foot N/A N/A Wounding Event: Pressure Injury N/A N/A Primary Etiology: Pressure Ulcer N/A N/A Comorbid History: Cataracts, Hypertension N/A N/A Date Acquired: 04/03/2020 N/A N/A Weeks of Treatment: 0 N/A N/A Wound Status: Open N/A  N/A Measurements L x W x D (cm) 5.1x5.6x0.5 N/A N/A Area (cm) : 22.431 N/A N/A Volume (cm) : 11.215 N/A N/A % Reduction in Area: 0.00% N/A N/A % Reduction in Volume: 0.00% N/A N/A Classification: Unstageable/Unclassified N/A N/A Exudate Amount: Large N/A N/A Exudate Type: Serosanguineous N/A N/A Exudate Color: red, brown N/A N/A Wound Margin: Thickened N/A N/A Granulation Amount: None Present (0%) N/A N/A Necrotic Amount: Large (67-100%) N/A N/A Necrotic Tissue: Eschar N/A N/A Epithelialization: None N/A N/A Treatment Notes Electronic Signature(s) Signed: 06/03/2020 4:08:54 PM By: Carlene Coria RN Entered By: Carlene Coria on 06/03/2020 13:12:17 Weld, Aldrick D. ([id number]) -------------------------------------------------------------------------------- Multi-Disciplinary  Care Plan Details [name] Date of Service: 06/03/2020 1:00 PM Medical Record Number: [id number] Patient Account Number: [id number] Date of Birth/Sex: 1935/02/13 (85 y.o. M) Treating RN: Carlene Coria Primary Care Victory Dresden: Nobie Putnam Other Clinician: Referring Pavielle Biggar: Shelby Mattocks Treating Mahira Petras/Extender: Skipper Cliche in Treatment: 0 Active Inactive Wound/Skin Impairment Nursing Diagnoses: Knowledge deficit related to ulceration/compromised skin integrity Goals: Patient/caregiver will verbalize understanding of skin care regimen Date Initiated: 06/03/2020 Target Resolution Date: 07/01/2020 Goal Status: Active Ulcer/skin breakdown will have a volume reduction of 30% by week 4 Date Initiated: 06/03/2020 Target Resolution Date: 07/01/2020 Goal Status: Active Interventions: Assess patient/caregiver ability to obtain necessary supplies Assess patient/caregiver ability to perform ulcer/skin care regimen upon admission and as needed Assess ulceration(s) every visit Notes: Electronic Signature(s) Signed: 06/03/2020 4:08:54 PM By: Carlene Coria RN Entered By:  Carlene Coria on 06/03/2020 13:09:57 Karger, Destry DMarland Kitchen ([id number]) -------------------------------------------------------------------------------- Pain Assessment Details [name] Date of Service: 06/03/2020 1:00 PM Medical Record Number: [id number] Patient Account Number: [id number] Date of Birth/Sex: 05/26/1934 (85 y.o. M) Treating RN: [name] Primary Care Melquan Ernsberger: Nobie Putnam Other Clinician: Referring Seyon Strader: Shelby Mattocks Treating Khyree Carillo/Extender: Skipper Cliche in Treatment: 0 Active Problems Location of Pain Severity and Description of Pain Patient Has Paino No Site Locations Pain Management and Medication Current Pain Management: Notes Patient denies pain at this time Electronic Signature(s) Signed: 06/03/2020 4:29:15 PM By: Gretta Cool, BSN, RN, CWS, Kim RN, BSN Entered By: Gretta Cool, BSN, RN, CWS, Kim on 06/03/2020 12:43:04 Polidori, [name] ([id number]) -------------------------------------------------------------------------------- Wound Assessment Details [name] Date of Service: 06/03/2020 1:00 PM Medical Record Number: [id number] Patient Account Number: [id number] Date of Birth/Sex: March 15, 1935 (85 y.o. M) Treating RN: [name] Primary Care Trevino Wyatt: Nobie Putnam Other Clinician: Referring Dajane Valli: Shelby Mattocks Treating Franciso Dierks/Extender: Skipper Cliche in Treatment: 0 Wound Status Wound Number: 1 Primary Etiology: Pressure Ulcer Wound Location: Right, Lateral Foot Wound Status: Open Wounding Event: Pressure Injury Comorbid History: Cataracts, Hypertension Date Acquired: 04/03/2020 Weeks Of Treatment: 0 Clustered Wound: No Photos Wound Measurements Length: (cm) 5.1 Width: (cm) 5.6 Depth: (cm) 0.5 Area: (cm) 22.431 Volume: (cm) 11.215 % Reduction in Area: 0% % Reduction in Volume: 0% Epithelialization: None Tunneling: No Undermining: No Wound  Description Classification: Unstageable/Unclassified Wound Margin: Thickened Exudate Amount: Large Exudate Type: Serosanguineous Exudate Color: red, brown Foul Odor After Cleansing: No Slough/Fibrino Yes Wound Bed Granulation Amount: None Present (0%) Necrotic Amount: Large (67-100%) Necrotic Quality: Estate manager/land agent Signature(s) Signed: 06/03/2020 4:29:15 PM By: Gretta Cool, BSN, RN, CWS, Kim RN, BSN Entered By: Gretta Cool, BSN, RN, CWS, Kim on 06/03/2020 12:56:59 [place], [name] ([id number]) -------------------------------------------------------------------------------- Vitals Details [name] Date of Service: 06/03/2020 1:00 PM Medical Record Number: [id number] Patient Account Number: [id number] Date of Birth/Sex: [birth year] (85 y.o. M) Treating RN: [name] Primary Care Amerika Nourse: Nobie Putnam Other Clinician: Referring Arleene Settle: Shelby Mattocks Treating Roshaun Pound/Extender: Skipper Cliche in Treatment: 0 Vital Signs Time Taken: 12:45 Temperature (F): 97.7 Height (in): 71 Pulse (bpm): 67 Weight (lbs): 216 Respiratory Rate (breaths/min): 18 Body Mass Index (BMI): 30.1 Blood Pressure (mmHg): 167/84 Reference Range: 80 - 120 mg / dl Electronic Signature(s) Signed: 06/03/2020 4:29:15 PM By: Gretta Cool, BSN, RN, CWS, Kim RN, BSN Entered By: Gretta Cool, BSN, RN, CWS, Kim on 06/03/2020 12:44:14

## 2020-06-04 DIAGNOSIS — L89610 Pressure ulcer of right heel, unstageable: Secondary | ICD-10-CM | POA: Diagnosis not present

## 2020-06-04 NOTE — Progress Notes (Signed)
[name] ([id number]) Visit Report for 06/03/2020 Chief Complaint Document Details [name], [name] Date of Service: 06/03/2020 1:00 PM Medical Record Number: [id number] Patient Account Number: [id number] Date of Birth/Sex: 1935/02/12 (85 y.o. M) Treating RN: [name] Primary Care Provider: Nobie Putnam Other Clinician: Referring Provider: Shelby Nguyen Treating Provider/Extender: [name] in Treatment: 0 Information Obtained from: Patient Chief Complaint Right heel ulcer Electronic Signature(s) Signed: 06/03/2020 1:08:06 PM By: Worthy Keeler PA-C Entered By: Worthy Nguyen on 06/03/2020 13:08:06 Parchment, [name] ([id number]) -------------------------------------------------------------------------------- Debridement Details [name] Date of Service: 06/03/2020 1:00 PM Medical Record Number: [id number] Patient Account Number: [id number] Date of Birth/Sex: July 16, 1934 (85 y.o. M) Treating RN: [name] Primary Care Provider: Nobie Putnam Other Clinician: Referring Provider: Shelby Nguyen Treating Provider/Extender: [name] in Treatment: 0 Debridement Performed for Wound #1 Right,Lateral Foot Assessment: Performed By: Physician Tommie Sams., PA-C Debridement Type: Debridement Level of Consciousness (Pre- Awake and Alert procedure): Pre-procedure Verification/Time Out Yes - 13:11 Taken: Start Time: 13:11 Pain Control: Lidocaine 4% Topical Solution Total Area Debrided (L x W): 5.1 (cm) x 5.6 (cm) = 28.56 (cm) Tissue and other material Viable, Non-Viable, Eschar, Slough, Subcutaneous, Skin: Dermis , Skin: Epidermis, Slough debrided: Level: Skin/Subcutaneous Tissue Debridement Description: Excisional Instrument: Forceps, Scissors Bleeding: Moderate Hemostasis Achieved: Pressure End Time: 13:16 Procedural Pain: 0 Post Procedural Pain: 0 Response to Treatment:  Procedure was tolerated well Level of Consciousness (Post- Awake and Alert procedure): Post Debridement Measurements of Total Wound Length: (cm) 5.1 Stage: Unstageable/Unclassified Width: (cm) 5.6 Depth: (cm) 0.5 Volume: (cm) 11.215 Character of Wound/Ulcer Post Debridement: Improved Post Procedure Diagnosis Same as Pre-procedure Electronic Signature(s) Signed: 06/03/2020 4:08:54 PM By: Jonathan Coria RN Signed: 06/04/2020 9:55:27 AM By: Worthy Keeler PA-C Entered By: [name] on 06/03/2020 13:13:59 Peart, [name] ([id number]) -------------------------------------------------------------------------------- HPI Details [name] Date of Service: 06/03/2020 1:00 PM Medical Record Number: [id number] Patient Account Number: [id number] Date of Birth/Sex: 1934/11/22 (85 y.o. M) Treating RN: [name] Primary Care Provider: Nobie Putnam Other Clinician: Referring Provider: Shelby Nguyen Treating Provider/Extender: [name] in Treatment: 0 History of Present Illness HPI Description: 06/03/2020 upon evaluation today patient actually presents for initial inspection here in our clinic after having gone to urgent care this morning where they were concerned about infection as well as a significant "softball sized wound with eschar" over the heel. With that being said the patient fortunately does not have an area that largely does have an area of eschar which is draining around the edges really not stable and I think we are getting to remove the eschar so we try to clean up the surface of the wound. Fortunately there is no evidence of active infection at this time. I do believe that he is going require some debridement both manually today with scissors and forceps as well as enzymatic/chemically with the medications. I really feel like that he has a good chance to get this to heal if he keep pressure off of it we did discuss that again  today. He does note this has been present for about 2 months. He is not taking any of his current medications that we did put him on the list he tells me that he only occasionally takes them which is obviously not doing anything for him. He does have a cane but he does not use it and his ABI was 1.14 on the right. Electronic Signature(s) Signed: 06/03/2020  1:20:03 PM By: Worthy Keeler PA-C Entered By: Worthy Nguyen on 06/03/2020 13:20:03 Vansickle, [name] ([id number]) -------------------------------------------------------------------------------- Physical Exam Details [name] Date of Service: 06/03/2020 1:00 PM Medical Record Number: [id number] Patient Account Number: [id number] Date of Birth/Sex: 03/06/1935 (85 y.o. M) Treating RN: [name] Primary Care Provider: Nobie Putnam Other Clinician: Referring Provider: Shelby Nguyen Treating Provider/Extender: [name] in Treatment: 0 Constitutional patient is hypertensive.. pulse regular and within target range for patient.Marland Kitchen respirations regular, non-labored and within target range for patient.Marland Kitchen temperature within target range for patient.. Well-nourished and well-hydrated in no acute distress. Eyes conjunctiva clear no eyelid edema noted. pupils equal round and reactive to light and accommodation. Ears, Nose, Mouth, and Throat no gross abnormality of ear auricles or external auditory canals. normal hearing noted during conversation. mucus membranes moist. Respiratory normal breathing without difficulty. Cardiovascular 1+ dorsalis pedis/posterior tibialis pulses. 2+ pitting edema of the bilateral lower extremities. Musculoskeletal unsteady while walking. no significant deformity or arthritic changes, no loss or range of motion, no clubbing. Psychiatric this patient is able to make decisions and demonstrates good insight into disease process. Alert and Oriented x 3. pleasant and  cooperative. Notes Upon inspection patient's wound bed actually showed signs of eschar that was not stable. He had drainage around the edges and it was very soft and did not appear to need to be removed. With that being said after removal he did still have some necrotic tissue noted that I think is going require some debridement by way of dressings and I think Iodoflex would likely be the best way to go at this point. The patient voiced an understanding and understands exactly what were doing as well as far as trying to clean up the surface of the wound. He also discussed with him that he needs to keep pressure off the area that means he is getting need to elevate his legs as much as possible in order to try to keep the edema under control and also making sure to put the pillow below his calf region in order to float the heel. He does not need to be doing anything that is going to cause excess pressure to this heel region. That skin to prevent it from healing and can cause it to even worsen. Electronic Signature(s) Signed: 06/03/2020 1:21:29 PM By: Worthy Keeler PA-C Entered By: Worthy Nguyen on 06/03/2020 13:21:29 Bishara, [name] ([id number]) -------------------------------------------------------------------------------- Physician Orders Details [name] Date of Service: 06/03/2020 1:00 PM Medical Record Number: [id number] Patient Account Number: [id number] Date of Birth/Sex: Feb 20, 1935 (85 y.o. M) Treating RN: [name] Primary Care Provider: Nobie Putnam Other Clinician: Referring Provider: Shelby Nguyen Treating Provider/Extender: [name] in Treatment: 0 Verbal / Phone Orders: No Diagnosis Coding ICD-10 Coding Code Description L89.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, unstageable R29.6 Repeated falls Z79.01 Long term (current) use of anticoagulants Follow-up Appointments o Return Appointment in 1 week. Bathing/ Shower/  Hygiene o May shower; gently cleanse wound with antibacterial soap, rinse and pat dry prior to dressing wounds Edema Control - Lymphedema / Segmental Compressive Device / Other o Elevate, Exercise Daily and Avoid Standing for Long Periods of Time. o Elevate legs to the level of the heart and pump ankles as often as possible o Elevate leg(s) parallel to the floor when sitting. Wound Treatment Wound #1 - Foot Wound Laterality: Right, Lateral Cleanser: Byram Ancillary Kit - 15 Day Supply (DME) (Generic) 3 x Per Week/30  Days Discharge Instructions: Use supplies as instructed; Kit contains: (15) Saline Bullets; (15) 3x3 Gauze; 15 pr Gloves Cleanser: Normal Saline (DME) (Generic) 3 x Per Week/30 Days Discharge Instructions: Wash your hands with soap and water. Remove old dressing, discard into plastic bag and place into trash. Cleanse the wound with Normal Saline prior to applying a clean dressing using gauze sponges, not tissues or cotton balls. Do not scrub or use excessive force. Pat dry using gauze sponges, not tissue or cotton balls. Primary Dressing: IODOFLEX 0.9% Cadexomer Iodine Pad (DME) (Generic) 3 x Per Week/30 Days Discharge Instructions: Apply Iodoflex to wound bed only as directed. Secondary Dressing: ABD Pad 5x9 (in/in) (DME) (Generic) 3 x Per Week/30 Days Discharge Instructions: Cover with ABD pad Secondary Dressing: Kerlix 4.5 x 4.1 (in/yd) (DME) (Generic) 3 x Per Week/30 Days Discharge Instructions: Apply Kerlix 4.5 x 4.1 (in/yd) as instructed Secured With: Tubigrip Size E, 3.5x10 (in/yds) (DME) (Generic) 3 x Per Week/30 Days Discharge Instructions: Apply 3 Tubigrip E 3-finger-widths below knee to base of toes to secure dressing and/or for swelling. Electronic Signature(s) Signed: 06/03/2020 4:08:54 PM By: Jonathan Coria RN Signed: 06/04/2020 9:55:27 AM By: Worthy Keeler PA-C Entered By: [name] on 06/03/2020 13:20:07 Dancy, [name]  ([id number]) -------------------------------------------------------------------------------- Problem List Details [name] Date of Service: 06/03/2020 1:00 PM Medical Record Number: [id number] Patient Account Number: [id number] Date of Birth/Sex: Nov 25, 1934 (85 y.o. M) Treating RN: [name] Primary Care Provider: Nobie Putnam Other Clinician: Referring Provider: Shelby Nguyen Treating Provider/Extender: [name] in Treatment: 0 Active Problems ICD-10 Encounter Code Description Active Date MDM Diagnosis L89.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, unstageable 06/03/2020 No Yes R29.6 Repeated falls 06/03/2020 No Yes Z79.01 Long term (current) use of anticoagulants 06/03/2020 No Yes Inactive Problems Resolved Problems Electronic Signature(s) Signed: 06/03/2020 1:07:53 PM By: Worthy Keeler PA-C Entered By: Worthy Nguyen on 06/03/2020 13:07:52 Pohle, [name] ([id number]) -------------------------------------------------------------------------------- Progress Note Details [name] Date of Service: 06/03/2020 1:00 PM Medical Record Number: [id number] Patient Account Number: [id number] Date of Birth/Sex: 1934-12-26 (85 y.o. M) Treating RN: [name] Primary Care Provider: Nobie Putnam Other Clinician: Referring Provider: Shelby Nguyen Treating Provider/Extender: [name] in Treatment: 0 Subjective Chief Complaint Information obtained from Patient Right heel ulcer History of Present Illness (HPI) 06/03/2020 upon evaluation today patient actually presents for initial inspection here in our clinic after having gone to urgent care this morning where they were concerned about infection as well as a significant "softball sized wound with eschar" over the heel. With that being said the patient fortunately does not have an area that largely does have an area of eschar which is draining  around the edges really not stable and I think we are getting to remove the eschar so we try to clean up the surface of the wound. Fortunately there is no evidence of active infection at this time. I do believe that he is going require some debridement both manually today with scissors and forceps as well as enzymatic/chemically with the medications. I really feel like that he has a good chance to get this to heal if he keep pressure off of it we did discuss that again today. He does note this has been present for about 2 months. He is not taking any of his current medications that we did put him on the list he tells me that he only occasionally takes them which is obviously not doing anything for him.  He does have a cane but he does not use it and his ABI was 1.14 on the right. Patient History Information obtained from Patient. Allergies No Known Drug Intolerances Family History Diabetes - Mother. Social History Never smoker, Marital Status - Married, Alcohol Use - Never, Drug Use - No History, Caffeine Use - Never. Medical History Eyes Patient has history of Cataracts - Removed Cardiovascular Patient has history of Hypertension Medical And Surgical History Notes Cardiovascular HDL Neurologic Stroke X 3 in 2019 Review of Systems (ROS) Ear/Nose/Mouth/Throat Complains or has symptoms of Sinusitis. Hematologic/Lymphatic Denies complaints or symptoms of Bleeding / Clotting Disorders, Human Immunodeficiency Virus. Respiratory Denies complaints or symptoms of Chronic or frequent coughs, Shortness of Breath. Cardiovascular Complains or has symptoms of LE edema. Gastrointestinal Denies complaints or symptoms of Frequent diarrhea, Nausea, Vomiting. Endocrine Denies complaints or symptoms of Hepatitis, Thyroid disease, Polydypsia (Excessive Thirst). Genitourinary Complains or has symptoms of Incontinence/dribbling. Denies complaints or symptoms of Kidney failure/  Dialysis. Immunological Denies complaints or symptoms of Hives, Itching. Integumentary (Skin) Complains or has symptoms of Wounds. Denies complaints or symptoms of Bleeding or bruising tendency. Musculoskeletal Denies complaints or symptoms of Muscle Pain, Muscle Weakness. Psychiatric GOVANI, RADLOFF ([id number]) Denies complaints or symptoms of Anxiety, Claustrophobia. Objective Constitutional patient is hypertensive.. pulse regular and within target range for patient.Marland Kitchen respirations regular, non-labored and within target range for patient.Marland Kitchen temperature within target range for patient.. Well-nourished and well-hydrated in no acute distress. Vitals Time Taken: 12:45 PM, Height: 71 in, Weight: 216 lbs, BMI: 30.1, Temperature: 97.7 F, Pulse: 67 bpm, Respiratory Rate: 18 breaths/min, Blood Pressure: 167/84 mmHg. Eyes conjunctiva clear no eyelid edema noted. pupils equal round and reactive to light and accommodation. Ears, Nose, Mouth, and Throat no gross abnormality of ear auricles or external auditory canals. normal hearing noted during conversation. mucus membranes moist. Respiratory normal breathing without difficulty. Cardiovascular 1+ dorsalis pedis/posterior tibialis pulses. 2+ pitting edema of the bilateral lower extremities. Musculoskeletal unsteady while walking. no significant deformity or arthritic changes, no loss or range of motion, no clubbing. Psychiatric this patient is able to make decisions and demonstrates good insight into disease process. Alert and Oriented x 3. pleasant and cooperative. General Notes: Upon inspection patient's wound bed actually showed signs of eschar that was not stable. He had drainage around the edges and it was very soft and did not appear to need to be removed. With that being said after removal he did still have some necrotic tissue noted that I think is going require some debridement by way of dressings and I think Iodoflex would likely be the  best way to go at this point. The patient voiced an understanding and understands exactly what were doing as well as far as trying to clean up the surface of the wound. He also discussed with him that he needs to keep pressure off the area that means he is getting need to elevate his legs as much as possible in order to try to keep the edema under control and also making sure to put the pillow below his calf region in order to float the heel. He does not need to be doing anything that is going to cause excess pressure to this heel region. That skin to prevent it from healing and can cause it to even worsen. Integumentary (Hair, Skin) Wound #1 status is Open. Original cause of wound was Pressure Injury. The wound is located on the Right,Lateral Foot. The wound measures 5.1cm length x 5.6cm width x  0.5cm depth; 22.431cm^2 area and 11.215cm^3 volume. There is no tunneling or undermining noted. There is a large amount of serosanguineous drainage noted. The wound margin is thickened. There is no granulation within the wound bed. There is a large (67-100%) amount of necrotic tissue within the wound bed including Eschar. Assessment Active Problems ICD-10 Pressure ulcer of right heel, unstageable Repeated falls Long term (current) use of anticoagulants Procedures Wound #1 Pre-procedure diagnosis of Wound #1 is a Pressure Ulcer located on the Right,Lateral Foot . There was a Excisional Skin/Subcutaneous Tissue Debridement with a total area of 28.56 sq cm performed by Tommie Sams., PA-C. With the following instrument(s): Forceps, and Scissors to JADEN, ABREU ([id number]) remove Viable and Non-Viable tissue/material. Material removed includes Eschar, Subcutaneous Tissue, Slough, Skin: Dermis, and Skin: Epidermis after achieving pain control using Lidocaine 4% Topical Solution. No specimens were taken. A time out was conducted at 13:11, prior to the start of the procedure. A Moderate amount of bleeding  was controlled with Pressure. The procedure was tolerated well with a pain level of 0 throughout and a pain level of 0 following the procedure. Post Debridement Measurements: 5.1cm length x 5.6cm width x 0.5cm depth; 11.215cm^3 volume. Post debridement Stage noted as Unstageable/Unclassified. Character of Wound/Ulcer Post Debridement is improved. Post procedure Diagnosis Wound #1: Same as Pre-Procedure Plan Follow-up Appointments: Return Appointment in 1 week. Bathing/ Shower/ Hygiene: May shower; gently cleanse wound with antibacterial soap, rinse and pat dry prior to dressing wounds Edema Control - Lymphedema / Segmental Compressive Device / Other: Elevate, Exercise Daily and Avoid Standing for Long Periods of Time. Elevate legs to the level of the heart and pump ankles as often as possible Elevate leg(s) parallel to the floor when sitting. WOUND #1: - Foot Wound Laterality: Right, Lateral Cleanser: Byram Ancillary Kit - 15 Day Supply (DME) (Generic) 3 x Per Week/30 Days Discharge Instructions: Use supplies as instructed; Kit contains: (15) Saline Bullets; (15) 3x3 Gauze; 15 pr Gloves Cleanser: Normal Saline (DME) (Generic) 3 x Per Week/30 Days Discharge Instructions: Wash your hands with soap and water. Remove old dressing, discard into plastic bag and place into trash. Cleanse the wound with Normal Saline prior to applying a clean dressing using gauze sponges, not tissues or cotton balls. Do not scrub or use excessive force. Pat dry using gauze sponges, not tissue or cotton balls. Primary Dressing: IODOFLEX 0.9% Cadexomer Iodine Pad (DME) (Generic) 3 x Per Week/30 Days Discharge Instructions: Apply Iodoflex to wound bed only as directed. Secondary Dressing: ABD Pad 5x9 (in/in) (DME) (Generic) 3 x Per Week/30 Days Discharge Instructions: Cover with ABD pad Secondary Dressing: Kerlix 4.5 x 4.1 (in/yd) (DME) (Generic) 3 x Per Week/30 Days Discharge Instructions: Apply Kerlix 4.5 x 4.1  (in/yd) as instructed Secured With: Tubigrip Size E, 3.5x10 (in/yds) (DME) (Generic) 3 x Per Week/30 Days Discharge Instructions: Apply 3 Tubigrip E 3-finger-widths below knee to base of toes to secure dressing and/or for swelling. 1. I am good recommend at this time that we have the patient go ahead and continue with the Iodoflex over the next week and wanted to see how things do. I think this is probably can be the best option for him as far as trying to clean up the surface of the wound here. 2. I am also can recommend at this time that the patient continue to keep pressure off of the area I did recommend he float the heel and I do think that is good to be  the best way to go. When he sit down in doing so I think that is good to help relieve the main issue that is causing trouble here for him. 3. I am also going to recommend at this time that we have the patient continue to monitor for any signs of infection again I do not see anything right now that appears to be infected we will be watching at each week but if his wife notices anything or the patient he should let me know soon as possible. 4. We will use Tubigrip for now to try to help with edema control. If that does not do the job or things seem to be somewhat stalled we may initiate a compression wrap. We will see patient back for reevaluation in 1 week here in the clinic. If anything worsens or changes patient will contact our office for additional recommendations. Electronic Signature(s) Signed: 06/03/2020 1:23:05 PM By: Worthy Keeler PA-C Entered By: Worthy Nguyen on 06/03/2020 13:23:05 Mittleman, [name] ([id number]) -------------------------------------------------------------------------------- ROS/PFSH Details [name] Date of Service: 06/03/2020 1:00 PM Medical Record Number: [id number] Patient Account Number: [id number] Date of Birth/Sex: 09-Nov-1934 (85 y.o. M) Treating RN: [name] Primary Care Provider:  Nobie Putnam Other Clinician: Referring Provider: Shelby Nguyen Treating Provider/Extender: [name] in Treatment: 0 Information Obtained From Patient Ear/Nose/Mouth/Throat Complaints and Symptoms: Positive for: Sinusitis Hematologic/Lymphatic Complaints and Symptoms: Negative for: Bleeding / Clotting Disorders; Human Immunodeficiency Virus Respiratory Complaints and Symptoms: Negative for: Chronic or frequent coughs; Shortness of Breath Cardiovascular Complaints and Symptoms: Positive for: LE edema Medical History: Positive for: Hypertension Past Medical History Notes: HDL Gastrointestinal Complaints and Symptoms: Negative for: Frequent diarrhea; Nausea; Vomiting Endocrine Complaints and Symptoms: Negative for: Hepatitis; Thyroid disease; Polydypsia (Excessive Thirst) Genitourinary Complaints and Symptoms: Positive for: Incontinence/dribbling Negative for: Kidney failure/ Dialysis Immunological Complaints and Symptoms: Negative for: Hives; Itching Integumentary (Skin) Complaints and Symptoms: Positive for: Wounds Negative for: Bleeding or bruising tendency Musculoskeletal Complaints and Symptoms: Negative for: Muscle Pain; Muscle Weakness Zappia, Devere D. ([id number]) Psychiatric Complaints and Symptoms: Negative for: Anxiety; Claustrophobia Eyes Medical History: Positive for: Cataracts - Removed Neurologic Medical History: Past Medical History Notes: Stroke X 3 in 2019 Oncologic HBO Extended History Items Eyes: Cataracts Immunizations Pneumococcal Vaccine: Received Pneumococcal Vaccination: No Implantable Devices None Family and Social History Diabetes: Yes - Mother; Never smoker; Marital Status - Married; Alcohol Use: Never; Drug Use: No History; Caffeine Use: Never Electronic Signature(s) Signed: 06/03/2020 4:29:15 PM By: Gretta Cool, BSN, RN, CWS, Kim RN, BSN Signed: 06/04/2020 9:55:27 AM By: Worthy Keeler PA-C Entered  By: Gretta Cool, BSN, RN, CWS, Kim on 06/03/2020 13:14:10 Madding, [name] ([id number]) -------------------------------------------------------------------------------- SuperBill Details [name] Date of Service: 06/03/2020 Medical Record Number: [id number] Patient Account Number: [id number] Date of Birth/Sex: 27-Oct-1934 (86 y.o. M) Treating RN: [name] Primary Care Provider: Nobie Putnam Other Clinician: Referring Provider: Shelby Nguyen Treating Provider/Extender: [name] in Treatment: 0 Diagnosis Coding ICD-10 Codes Code Description L89.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, unstageable R29.6 Repeated falls Z79.01 Long term (current) use of anticoagulants Facility Procedures CPT4 Code: [id number] Description: 68341 - DEB SUBQ TISSUE 20 SQ CM/< Modifier: Quantity: 1 CPT4 Code: Description: ICD-10 Diagnosis Description L89.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, unstageable Modifier: Quantity: CPT4 Code: [id number] Description: 89211 - DEB SUBQ TISS EA ADDL 20CM Modifier: Quantity: 1 CPT4 Code: Description: ICD-10 Diagnosis Description L89.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, unstageable Modifier: Quantity: Physician Procedures CPT4 Code: 9417408 Description:  WC PHYS LEVEL 3 o NEW PT Modifier: 25 Quantity: 1 CPT4 Code: Description: ICD-10 Diagnosis Description L89.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, unstageable R29.6 Repeated falls Z79.01 Long term (current) use of anticoagulants Modifier: Quantity: CPT4 Code: 2355732 Description: 20254 - WC PHYS SUBQ TISS 20 SQ CM Modifier: Quantity: 1 CPT4 Code: Description: ICD-10 Diagnosis Description L89.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, unstageable Modifier: Quantity: CPT4 Code: 2706237 Description: 62831 - WC PHYS SUBQ TISS EA ADDL 20 CM Modifier: Quantity: 1 CPT4 Code: Description: ICD-10 Diagnosis Description L89.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,  unstageable Modifier: Quantity: Electronic Signature(s) Signed: 06/03/2020 1:25:33 PM By: Worthy Keeler PA-C Entered By: Worthy Nguyen on 06/03/2020 13:25:32

## 2020-06-10 ENCOUNTER — Encounter: Payer: Medicare Other | Admitting: Physician Assistant

## 2020-06-10 ENCOUNTER — Other Ambulatory Visit
Admission: RE | Admit: 2020-06-10 | Discharge: 2020-06-10 | Disposition: A | Discharge status: Home / Self Care | Payer: Medicare Other | Source: Ambulatory Visit | Attending: Physician Assistant | Admitting: Physician Assistant

## 2020-06-10 ENCOUNTER — Other Ambulatory Visit: Payer: Self-pay

## 2020-06-10 DIAGNOSIS — Z7901 Long term (current) use of anticoagulants: Secondary | ICD-10-CM | POA: Diagnosis not present

## 2020-06-10 DIAGNOSIS — L89610 Pressure ulcer of right heel, unstageable: Secondary | ICD-10-CM | POA: Diagnosis not present

## 2020-06-10 DIAGNOSIS — L89890 Pressure ulcer of other site, unstageable: Secondary | ICD-10-CM | POA: Diagnosis not present

## 2020-06-10 NOTE — Progress Notes (Addendum)
[name], [name] ([id number]) Visit Report for 06/10/2020 Chief Complaint Document Details [name], [name] Date of Service: 06/10/2020 9:15 AM Medical Record Number: [id number] Patient Account Number: [id number] Date of Birth/Sex: 02-19-1935 (85 y.o. M) Treating RN: Carlene Coria Primary Care Provider: Nobie Putnam Other Clinician: Referring Provider: Nobie Putnam Treating Provider/Extender: Skipper Cliche in Treatment: 1 Information Obtained from: Patient Chief Complaint Right heel ulcer Electronic Signature(s) Signed: 06/10/2020 10:03:28 AM By: Worthy Keeler PA-C Entered By: Worthy Keeler on 06/10/2020 10:03:28 [name], [name] ([id number]) -------------------------------------------------------------------------------- Debridement Details [name] Date of Service: 06/10/2020 9:15 AM Medical Record Number: [id number] Patient Account Number: [id number] Date of Birth/Sex: 1934-11-28 (85 y.o. M) Treating RN: Carlene Coria Primary Care Provider: Nobie Putnam Other Clinician: Referring Provider: Nobie Putnam Treating Provider/Extender: Skipper Cliche in Treatment: 1 Debridement Performed for Wound #1 Right,Lateral Foot Assessment: Performed By: Physician Tommie Sams., PA-C Debridement Type: Debridement Level of Consciousness (Pre- Awake and Alert procedure): Pre-procedure Verification/Time Out Yes - 10:03 Taken: Start Time: 10:03 Pain Control: Lidocaine 4% Topical Solution Total Area Debrided (L x W): 5 (cm) x 5.3 (cm) = 26.5 (cm) Tissue and other material Viable, Non-Viable, Eschar, Slough, Subcutaneous, Skin: Dermis , Skin: Epidermis, Slough debrided: Level: Skin/Subcutaneous Tissue Debridement Description: Excisional Instrument: Curette Bleeding: Moderate Hemostasis Achieved: Pressure End Time: 10:10 Procedural Pain: 0 Post Procedural Pain: 0 Response to Treatment: Procedure was tolerated  well Level of Consciousness (Post- Awake and Alert procedure): Post Debridement Measurements of Total Wound Length: (cm) 5 Stage: Unstageable/Unclassified Width: (cm) 5.3 Depth: (cm) 0.5 Volume: (cm) 10.407 Character of Wound/Ulcer Post Debridement: Improved Post Procedure Diagnosis Same as Pre-procedure Electronic Signature(s) Signed: 06/10/2020 4:57:18 PM By: Carlene Coria RN Signed: 06/10/2020 5:46:59 PM By: Worthy Keeler PA-C Entered By: Carlene Coria on 06/10/2020 10:08:36 [name], [name] Kitchen ([id number]) -------------------------------------------------------------------------------- HPI Details [name] Date of Service: 06/10/2020 9:15 AM Medical Record Number: [id number] Patient Account Number: [id number] Date of Birth/Sex: 02/20/1935 (85 y.o. M) Treating RN: Carlene Coria Primary Care Provider: Nobie Putnam Other Clinician: Referring Provider: Nobie Putnam Treating Provider/Extender: Skipper Cliche in Treatment: 1 History of Present Illness HPI Description: 06/03/2020 upon evaluation today patient actually presents for initial inspection here in our clinic after having gone to urgent care this morning where they were concerned about infection as well as a significant "softball sized wound with eschar" over the heel. With that being said the patient fortunately does not have an area that largely does have an area of eschar which is draining around the edges really not stable and I think we are getting to remove the eschar so we try to clean up the surface of the wound. Fortunately there is no evidence of active infection at this time. I do believe that he is going require some debridement both manually today with scissors and forceps as well as enzymatic/chemically with the medications. I really feel like that he has a good chance to get this to heal if he keep pressure off of it we did discuss that again today. He does note this has been  present for about 2 months. He is not taking any of his current medications that we did put him on the list he tells me that he only occasionally takes them which is obviously not doing anything for him. He does have a cane but he does not use it and his ABI was 1.14 on the right. 06/10/2020 upon evaluation today patient  appears to be doing well with regard to his wound all things considered. He still has a lot of necrotic tissue in the central portion of the wound. I think the Iodosorb is still the right thing to do and again the biggest issue is he quit using it as he and his wife felt like that the drainage following putting on the medicine was really "nasty". Nonetheless I explained that the weight Iodoflex breaks down or Iodosorb and that round does make it look really bad but it actually is not as bad as what it seems. In fact it is the normal course of things. Electronic Signature(s) Signed: 06/10/2020 10:56:10 AM By: Worthy Keeler PA-C Entered By: Worthy Keeler on 06/10/2020 10:56:09 [name], [name] ([id number]) -------------------------------------------------------------------------------- Physical Exam Details [name] Date of Service: 06/10/2020 9:15 AM Medical Record Number: [id number] Patient Account Number: [id number] Date of Birth/Sex: 1934-10-09 (85 y.o. M) Treating RN: Carlene Coria Primary Care Provider: Nobie Putnam Other Clinician: Referring Provider: Nobie Putnam Treating Provider/Extender: Skipper Cliche in Treatment: 1 Constitutional Well-nourished and well-hydrated in no acute distress. Respiratory normal breathing without difficulty. Psychiatric this patient is able to make decisions and demonstrates good insight into disease process. Alert and Oriented x 3. pleasant and cooperative. Notes Upon inspection patient's wound bed actually showed signs of good granulation epithelization at this point and some areas around the  edge. With that being said there was a lot of necrotic tissue in the central portion of the wound which did require sharp debridement. I did perform debridement clear away some of this necrotic debris he tolerated all this today without complication post debridement wound bed appears to be doing much better which is great news. Electronic Signature(s) Signed: 06/10/2020 10:56:32 AM By: Worthy Keeler PA-C Entered By: Worthy Keeler on 06/10/2020 10:56:32 [name], [name] ([id number]) -------------------------------------------------------------------------------- Physician Orders Details [name] Date of Service: 06/10/2020 9:15 AM Medical Record Number: [id number] Patient Account Number: [id number] Date of Birth/Sex: Sep 28, 1934 (85 y.o. M) Treating RN: Carlene Coria Primary Care Provider: Nobie Putnam Other Clinician: Referring Provider: Nobie Putnam Treating Provider/Extender: Skipper Cliche in Treatment: 1 Verbal / Phone Orders: No Diagnosis Coding ICD-10 Coding Code Description L89.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, unstageable R29.6 Repeated falls Z79.01 Long term (current) use of anticoagulants Follow-up Appointments o Return Appointment in 1 week. Bathing/ Shower/ Hygiene o May shower; gently cleanse wound with antibacterial soap, rinse and pat dry prior to dressing wounds Edema Control - Lymphedema / Segmental Compressive Device / Other o Elevate, Exercise Daily and Avoid Standing for Long Periods of Time. o Elevate legs to the level of the heart and pump ankles as often as possible o Elevate leg(s) parallel to the floor when sitting. Wound Treatment Wound #1 - Foot Wound Laterality: Right, Lateral Cleanser: Byram Ancillary Kit - 15 Day Supply (Generic) 3 x Per Week/30 Days Discharge Instructions: Use supplies as instructed; Kit contains: (15) Saline Bullets; (15) 3x3 Gauze; 15 pr Gloves Cleanser: Normal Saline (Generic) 3 x  Per Week/30 Days Discharge Instructions: Wash your hands with soap and water. Remove old dressing, discard into plastic bag and place into trash. Cleanse the wound with Normal Saline prior to applying a clean dressing using gauze sponges, not tissues or cotton balls. Do not scrub or use excessive force. Pat dry using gauze sponges, not tissue or cotton balls. Primary Dressing: IODOFLEX 0.9% Cadexomer Iodine Pad (Generic) 3 x Per Week/30 Days Discharge Instructions: Apply  Iodoflex to wound bed only as directed. Secondary Dressing: ABD Pad 5x9 (in/in) (Generic) 3 x Per Week/30 Days Discharge Instructions: Cover with ABD pad Secondary Dressing: Kerlix 4.5 x 4.1 (in/yd) (Generic) 3 x Per Week/30 Days Discharge Instructions: Apply Kerlix 4.5 x 4.1 (in/yd) as instructed Secured With: Tubigrip Size E, 3.5x10 (in/yds) (Generic) 3 x Per Week/30 Days Discharge Instructions: Apply 3 Tubigrip E 3-finger-widths below knee to base of toes to secure dressing and/or for swelling. Laboratory o Bacteria identified in Wound by Culture (MICRO) - non healing wound right heel - (ICD10 L89.610 - Pressure ulcer of right heel, unstageable) oooo LOINC Code: 4734-0 oooo Convenience Name: Wound culture routine Electronic Signature(s) Signed: 06/10/2020 4:57:18 PM By: Carlene Coria RN Signed: 06/10/2020 5:46:59 PM By: Worthy Keeler PA-C Entered By: Carlene Coria on 06/10/2020 10:10:00 [name], [name] ([id number]) [name], [name] ([id number]) -------------------------------------------------------------------------------- Problem List Details [name] Date of Service: 06/10/2020 9:15 AM Medical Record Number: [id number] Patient Account Number: [id number] Date of Birth/Sex: 05/20/1934 (85 y.o. M) Treating RN: Carlene Coria Primary Care Provider: Nobie Putnam Other Clinician: Referring Provider: Nobie Putnam Treating Provider/Extender: Skipper Cliche in Treatment: 1 Active  Problems ICD-10 Encounter Code Description Active Date MDM Diagnosis L89.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, unstageable 06/03/2020 No Yes R29.6 Repeated falls 06/03/2020 No Yes Z79.01 Long term (current) use of anticoagulants 06/03/2020 No Yes Inactive Problems Resolved Problems Electronic Signature(s) Signed: 06/10/2020 10:03:22 AM By: Worthy Keeler PA-C Entered By: Worthy Keeler on 06/10/2020 10:03:22 [name], [name] ([id number]) -------------------------------------------------------------------------------- Progress Note Details [name] Date of Service: 06/10/2020 9:15 AM Medical Record Number: [id number] Patient Account Number: [id number] Date of Birth/Sex: 1934-06-24 (85 y.o. M) Treating RN: Carlene Coria Primary Care Provider: Nobie Putnam Other Clinician: Referring Provider: Nobie Putnam Treating Provider/Extender: Skipper Cliche in Treatment: 1 Subjective Chief Complaint Information obtained from Patient Right heel ulcer History of Present Illness (HPI) 06/03/2020 upon evaluation today patient actually presents for initial inspection here in our clinic after having gone to urgent care this morning where they were concerned about infection as well as a significant "softball sized wound with eschar" over the heel. With that being said the patient fortunately does not have an area that largely does have an area of eschar which is draining around the edges really not stable and I think we are getting to remove the eschar so we try to clean up the surface of the wound. Fortunately there is no evidence of active infection at this time. I do believe that he is going require some debridement both manually today with scissors and forceps as well as enzymatic/chemically with the medications. I really feel like that he has a good chance to get this to heal if he keep pressure off of it we did discuss that again today. He does note this has been  present for about 2 months. He is not taking any of his current medications that we did put him on the list he tells me that he only occasionally takes them which is obviously not doing anything for him. He does have a cane but he does not use it and his ABI was 1.14 on the right. 06/10/2020 upon evaluation today patient appears to be doing well with regard to his wound all things considered. He still has a lot of necrotic tissue in the central portion of the wound. I think the Iodosorb is still the right thing to do and again the  biggest issue is he quit using it as he and his wife felt like that the drainage following putting on the medicine was really "nasty". Nonetheless I explained that the weight Iodoflex breaks down or Iodosorb and that round does make it look really bad but it actually is not as bad as what it seems. In fact it is the normal course of things. Objective Constitutional Well-nourished and well-hydrated in no acute distress. Vitals Time Taken: 9:53 AM, Height: 71 in, Weight: 216 lbs, BMI: 30.1, Temperature: 98.2 F, Pulse: 62 bpm, Respiratory Rate: 18 breaths/min, Blood Pressure: 182/96 mmHg. Respiratory normal breathing without difficulty. Psychiatric this patient is able to make decisions and demonstrates good insight into disease process. Alert and Oriented x 3. pleasant and cooperative. General Notes: Upon inspection patient's wound bed actually showed signs of good granulation epithelization at this point and some areas around the edge. With that being said there was a lot of necrotic tissue in the central portion of the wound which did require sharp debridement. I did perform debridement clear away some of this necrotic debris he tolerated all this today without complication post debridement wound bed appears to be doing much better which is great news. Integumentary (Hair, Skin) Wound #1 status is Open. Original cause of wound was Pressure Injury. The date acquired  was: 04/03/2020. The wound has been in treatment 1 weeks. The wound is located on the Right,Lateral Foot. The wound measures 5cm length x 5.3cm width x 0.5cm depth; 20.813cm^2 area and 10.407cm^3 volume. There is Fat Layer (Subcutaneous Tissue) exposed. There is no tunneling noted. There is a medium amount of serosanguineous drainage noted. The wound margin is thickened. There is small (1-33%) granulation within the wound bed. There is a large (67- 100%) amount of necrotic tissue within the wound bed including Eschar and Adherent Slough. Assessment [name], [name] ([id number]) Active Problems ICD-10 Pressure ulcer of right heel, unstageable Repeated falls Long term (current) use of anticoagulants Procedures Wound #1 Pre-procedure diagnosis of Wound #1 is a Pressure Ulcer located on the Right,Lateral Foot . There was a Excisional Skin/Subcutaneous Tissue Debridement with a total area of 26.5 sq cm performed by Tommie Sams., PA-C. With the following instrument(s): Curette to remove Viable and Non-Viable tissue/material. Material removed includes Eschar, Subcutaneous Tissue, Slough, Skin: Dermis, and Skin: Epidermis after achieving pain control using Lidocaine 4% Topical Solution. No specimens were taken. A time out was conducted at 10:03, prior to the start of the procedure. A Moderate amount of bleeding was controlled with Pressure. The procedure was tolerated well with a pain level of 0 throughout and a pain level of 0 following the procedure. Post Debridement Measurements: 5cm length x 5.3cm width x 0.5cm depth; 10.407cm^3 volume. Post debridement Stage noted as Unstageable/Unclassified. Character of Wound/Ulcer Post Debridement is improved. Post procedure Diagnosis Wound #1: Same as Pre-Procedure Plan Follow-up Appointments: Return Appointment in 1 week. Bathing/ Shower/ Hygiene: May shower; gently cleanse wound with antibacterial soap, rinse and pat dry prior to dressing wounds Edema  Control - Lymphedema / Segmental Compressive Device / Other: Elevate, Exercise Daily and Avoid Standing for Long Periods of Time. Elevate legs to the level of the heart and pump ankles as often as possible Elevate leg(s) parallel to the floor when sitting. Laboratory ordered were: Wound culture routine - non healing wound right heel WOUND #1: - Foot Wound Laterality: Right, Lateral Cleanser: Byram Ancillary Kit - 15 Day Supply (Generic) 3 x Per Week/30 Days Discharge Instructions: Use supplies as  instructed; Kit contains: (15) Saline Bullets; (15) 3x3 Gauze; 15 pr Gloves Cleanser: Normal Saline (Generic) 3 x Per Week/30 Days Discharge Instructions: Wash your hands with soap and water. Remove old dressing, discard into plastic bag and place into trash. Cleanse the wound with Normal Saline prior to applying a clean dressing using gauze sponges, not tissues or cotton balls. Do not scrub or use excessive force. Pat dry using gauze sponges, not tissue or cotton balls. Primary Dressing: IODOFLEX 0.9% Cadexomer Iodine Pad (Generic) 3 x Per Week/30 Days Discharge Instructions: Apply Iodoflex to wound bed only as directed. Secondary Dressing: ABD Pad 5x9 (in/in) (Generic) 3 x Per Week/30 Days Discharge Instructions: Cover with ABD pad Secondary Dressing: Kerlix 4.5 x 4.1 (in/yd) (Generic) 3 x Per Week/30 Days Discharge Instructions: Apply Kerlix 4.5 x 4.1 (in/yd) as instructed Secured With: Tubigrip Size E, 3.5x10 (in/yds) (Generic) 3 x Per Week/30 Days Discharge Instructions: Apply 3 Tubigrip E 3-finger-widths below knee to base of toes to secure dressing and/or for swelling. 1. Would recommend currently that we continue with the Iodosorb at home we did apply Iodoflex here. I think this is doing a good job for the patient. 2. I am also can recommend we have the patient going to continue with offloading I think he needs to try to keep pressure off of this as much as possible. 3. I am also can  recommend that the patient continue to float the heel when he is laying down I think that is one of the big things at this point. We will see patient back for reevaluation in 1 week here in the clinic. If anything worsens or changes patient will contact our office for additional recommendations. Electronic Signature(s) Signed: 06/10/2020 10:57:10 AM By: Worthy Keeler PA-C Entered By: Worthy Keeler on 06/10/2020 10:57:10 [name], [name] ([id number]) [address], [name] ([id number]) -------------------------------------------------------------------------------- SuperBill Details [name] Date of Service: 06/10/2020 Medical Record Number: [id number] Patient Account Number: [id number] Date of Birth/Sex: Jan 05, 1935 (86 y.o. M) Treating RN: Carlene Coria Primary Care Provider: Nobie Putnam Other Clinician: Referring Provider: Nobie Putnam Treating Provider/Extender: Skipper Cliche in Treatment: 1 Diagnosis Coding ICD-10 Codes Code Description L89.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, unstageable R29.6 Repeated falls Z79.01 Long term (current) use of anticoagulants Facility Procedures CPT4 Code: [id number] Description: 22840 - DEB SUBQ TISSUE 20 SQ CM/< Modifier: Quantity: 1 CPT4 Code: Description: ICD-10 Diagnosis Description L89.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, unstageable Modifier: Quantity: CPT4 Code: 69861483 Description: 07354 - DEB SUBQ TISS EA ADDL 20CM Modifier: Quantity: 1 CPT4 Code: Description: ICD-10 Diagnosis Description L89.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, unstageable Modifier: Quantity: Physician Procedures CPT4 Code: 3014840 Description: 39795 - WC PHYS SUBQ TISS 20 SQ CM Modifier: Quantity: 1 CPT4 Code: Description: ICD-10 Diagnosis Description L89.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, unstageable Modifier: Quantity: CPT4 Code: 3692230 Description: 09794 - WC PHYS SUBQ TISS EA ADDL 20 CM Modifier: Quantity: 1 CPT4  Code: Description: ICD-10 Diagnosis Description L89.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, unstageable Modifier: Quantity: Electronic Signature(s) Signed: 06/10/2020 10:57:21 AM By: Worthy Keeler PA-C Entered By: Worthy Keeler on 06/10/2020 10:57:21

## 2020-06-10 NOTE — Progress Notes (Signed)
[name], [name] ([id number]) Visit Report for 06/10/2020 Arrival Information Details [name], [name] Date of Service: 06/10/2020 9:15 AM Medical Record Number: [id number] Patient Account Number: [id number] Date of Birth/Sex: 1934/05/06 (85 y.o. M) Treating RN: [name] Primary Care [name]: [name] Other Clinician: Referring [name]: [name] Treating [name]/Extender: [name] in Treatment: 1 Visit Information History Since Last Visit All ordered tests and consults were completed: No Patient Arrived: Ambulatory Added or deleted any medications: No Arrival Time: 09:44 Any new allergies or adverse reactions: No Accompanied By: self Had a fall or experienced change in No Transfer Assistance: None activities of daily living that may affect Patient Identification Verified: Yes risk of falls: Secondary Verification Process Completed: Yes Signs or symptoms of abuse/neglect since last visito No Patient Has Alerts: Yes Hospitalized since last visit: No Patient Alerts: Not Diabetic Implantable device outside of the clinic excluding No Plavix cellular tissue based products placed in the center since last visit: Has Dressing in Place as Prescribed: Yes Pain Present Now: No Electronic Signature(s) Signed: 06/10/2020 4:57:18 PM By: Jonathan Coria RN Entered By: [name] on 06/10/2020 09:52:58 [name], [name] ([id number]) -------------------------------------------------------------------------------- Clinic Level of Care Assessment Details [name] Date of Service: 06/10/2020 9:15 AM Medical Record Number: [id number] Patient Account Number: [id number] Date of Birth/Sex: 1935-01-22 (85 y.o. M) Treating RN: [name] Primary Care [name]: [name] Other Clinician: Referring [name]: [name] Treating [name]/Extender: [name] in Treatment: 1 Clinic Level of Care  Assessment Items TOOL 1 Quantity Score []  - Use when EandM and Procedure is performed on INITIAL visit 0 ASSESSMENTS - Nursing Assessment / Reassessment []  - General Physical Exam (combine w/ comprehensive assessment (listed just below) when performed on new 0 pt. evals) []  - 0 Comprehensive Assessment (HX, ROS, Risk Assessments, Wounds Hx, etc.) ASSESSMENTS - Wound and Skin Assessment / Reassessment []  - Dermatologic / Skin Assessment (not related to wound area) 0 ASSESSMENTS - Ostomy and/or Continence Assessment and Care []  - Incontinence Assessment and Management 0 []  - 0 Ostomy Care Assessment and Management (repouching, etc.) PROCESS - Coordination of Care []  - Simple Patient / Family Education for ongoing care 0 []  - 0 Complex (extensive) Patient / Family Education for ongoing care []  - 0 Staff obtains Programmer, systems, Records, Test Results / Process Orders []  - 0 Staff telephones HHA, Nursing Homes / Clarify orders / etc []  - 0 Routine Transfer to another Facility (non-emergent condition) []  - 0 Routine Hospital Admission (non-emergent condition) []  - 0 New Admissions / Biomedical engineer / Ordering NPWT, Apligraf, etc. []  - 0 Emergency Hospital Admission (emergent condition) PROCESS - Special Needs []  - Pediatric / Minor Patient Management 0 []  - 0 Isolation Patient Management []  - 0 Hearing / Language / Visual special needs []  - 0 Assessment of Community assistance (transportation, D/C planning, etc.) []  - 0 Additional assistance / Altered mentation []  - 0 Support Surface(s) Assessment (bed, cushion, seat, etc.) INTERVENTIONS - Miscellaneous []  - External ear exam 0 []  - 0 Patient Transfer (multiple staff / Civil Service fast streamer / Similar devices) []  - 0 Simple Staple / Suture removal (25 or less) []  - 0 Complex Staple / Suture removal (26 or more) []  - 0 Hypo/Hyperglycemic Management (do not check if billed separately) []  - 0 Ankle / Brachial Index (ABI) - do not  check if billed separately Has the patient been seen at the hospital within the last three years: Yes Total Score: 0 Level  Of Care: ____ [name] ([id number]) Electronic Signature(s) Signed: 06/10/2020 4:57:18 PM By: Jonathan Coria RN Entered By: [name] on 06/10/2020 10:18:34 [place], [name] ([id number]) -------------------------------------------------------------------------------- Lower Extremity Assessment Details [name] Date of Service: 06/10/2020 9:15 AM Medical Record Number: [id number] Patient Account Number: [id number] Date of Birth/Sex: 09-13-1934 (85 y.o. M) Treating RN: [name] Primary Care Chrisean Kloth: [name] Other Clinician: Referring [name]: [name] Treating Donika Butner/Extender: [name] in Treatment: 1 Edema Assessment Assessed: [Left: No] [Right: No] Edema: [Left: Ye] [Right: s] Calf Left: Right: Point of Measurement: 31 cm From Medial Instep 41 cm Ankle Left: Right: Point of Measurement: 12 cm From Medial Instep 28 cm Vascular Assessment Pulses: Dorsalis Pedis Palpable: [Right:Yes] Electronic Signature(s) Signed: 06/10/2020 4:57:18 PM By: Jonathan Coria RN Entered By: [name] on 06/10/2020 09:55:45 [name], Jonathan DMarland Kitchen ([id number]) -------------------------------------------------------------------------------- Multi Wound Chart Details Patient Name: Jonathan Moros D. Date of Service: 06/10/2020 9:15 AM Medical Record Number: [id number] Patient Account Number: [id number] Date of Birth/Sex: 01-18-1935 (85 y.o. M) Treating RN: [name] Primary Care Deionte Spivack: [name] Other Clinician: Referring [name]: [name] Treating [name]/Extender: [name] in Treatment: 1 Vital Signs Height(in): 71 Pulse(bpm): 37 Weight(lbs): 216 Blood Pressure(mmHg): 182/96 Body Mass Index(BMI): 30 Temperature(F): 98.2 Respiratory Rate(breaths/min): 18 Photos:  [N/A:N/A] Wound Location: Right, Lateral Foot N/A N/A Wounding Event: Pressure Injury N/A N/A Primary Etiology: Pressure Ulcer N/A N/A Comorbid History: Cataracts, Hypertension N/A N/A Date Acquired: 04/03/2020 N/A N/A Weeks of Treatment: 1 N/A N/A Wound Status: Open N/A N/A Measurements L x W x D (cm) 5x5.3x0.5 N/A N/A Area (cm) : 20.813 N/A N/A Volume (cm) : 10.407 N/A N/A % Reduction in Area: 7.20% N/A N/A % Reduction in Volume: 7.20% N/A N/A Classification: Unstageable/Unclassified N/A N/A Exudate Amount: Medium N/A N/A Exudate Type: Serosanguineous N/A N/A Exudate Color: red, brown N/A N/A Wound Margin: Thickened N/A N/A Granulation Amount: Small (1-33%) N/A N/A Necrotic Amount: Large (67-100%) N/A N/A Necrotic Tissue: Eschar, Adherent Slough N/A N/A Exposed Structures: Fat Layer (Subcutaneous Tissue): N/A N/A Yes Epithelialization: None N/A N/A Treatment Notes Electronic Signature(s) Signed: 06/10/2020 4:57:18 PM By: Jonathan Coria RN Entered By: [name] on 06/10/2020 10:04:39 [name], [name] ([id number]) -------------------------------------------------------------------------------- [place] Details [name] Date of Service: 06/10/2020 9:15 AM Medical Record Number: [id number] Patient Account Number: [id number] Date of Birth/Sex: January 20, 1935 (85 y.o. M) Treating RN: [name] Primary Care Kady Toothaker: [name] Other Clinician: Referring Aleane Wesenberg: [name] Treating Timathy Newberry/Extender: [name] in Treatment: 1 Active Inactive Wound/Skin Impairment Nursing Diagnoses: Knowledge deficit related to ulceration/compromised skin integrity Goals: Patient/caregiver will verbalize understanding of skin care regimen Date Initiated: 06/03/2020 Target Resolution Date: 07/01/2020 Goal Status: Active Ulcer/skin breakdown will have a volume reduction of 30% by week 4 Date Initiated: 06/03/2020 Target  Resolution Date: 07/01/2020 Goal Status: Active Interventions: Assess patient/caregiver ability to obtain necessary supplies Assess patient/caregiver ability to perform ulcer/skin care regimen upon admission and as needed Assess ulceration(s) every visit Notes: Electronic Signature(s) Signed: 06/10/2020 4:57:18 PM By: Jonathan Coria RN Entered By: [name] on 06/10/2020 10:04:28 Bo[place]hower, [name] ([id number]) -------------------------------------------------------------------------------- Pain Assessment Details [name] Date of Service: 06/10/2020 9:15 AM Medical Record Number: [id number] Patient Account Number: [id number] Date of Birth/Sex: 07-Jan-1935 (85 y.o. M) Treating RN: [name] Primary Care Annette Bertelson: [name] Other Clinician: Referring Lamon Rotundo: [name] Treating Yusra Ravert/Extender: [name] in Treatment: 1 Active Problems Location of Pain Severity and Description of  Pain Patient Has Paino No Site Locations Pain Management and Medication Current Pain Management: Electronic Signature(s) Signed: 06/10/2020 4:57:18 PM By: Jonathan Coria RN Entered By: [name] on 06/10/2020 09:53:23 Crook, [name] ([id number]) -------------------------------------------------------------------------------- Patient/Caregiver Education Details [name] Date of Service: 06/10/2020 9:15 AM Medical Record Number: [id number] Patient Account Number: [id number] Date of Birth/Gender: 1935-04-09 (85 y.o. M) Treating RN: [name] Primary Care Physician: [name] Other Clinician: Referring Physician: Nobie Nguyen Treating Physician/Extender: [name] in Treatment: 1 Education Assessment Education Provided To: Patient Education Topics Provided Wound/Skin Impairment: Methods: Explain/Verbal Responses: State content correctly Electronic Signature(s) Signed: 06/10/2020 4:57:18 PM By:  Jonathan Coria RN Entered By: [name] on 06/10/2020 10:19:07 Yaun, [name] ([id number]) -------------------------------------------------------------------------------- Wound Assessment Details [name] Date of Service: 06/10/2020 9:15 AM Medical Record Number: [id number] Patient Account Number: [id number] Date of Birth/Sex: 04-16-1935 (85 y.o. M) Treating RN: [name] Primary Care Jaella Weinert: [name] Other Clinician: Referring Tawny Raspberry: [name] Treating Sabri Teal/Extender: [name] in Treatment: 1 Wound Status Wound Number: 1 Primary Etiology: Pressure Ulcer Wound Location: Right, Lateral Foot Wound Status: Open Wounding Event: Pressure Injury Comorbid History: Cataracts, Hypertension Date Acquired: 04/03/2020 Weeks Of Treatment: 1 Clustered Wound: No Photos Wound Measurements Length: (cm) 5 Width: (cm) 5.3 Depth: (cm) 0.5 Area: (cm) 20.813 Volume: (cm) 10.407 % Reduction in Area: 7.2% % Reduction in Volume: 7.2% Epithelialization: None Tunneling: No Wound Description Classification: Unstageable/Unclassified Wound Margin: Thickened Exudate Amount: Medium Exudate Type: Serosanguineous Exudate Color: red, brown Foul Odor After Cleansing: No Slough/Fibrino Yes Wound Bed Granulation Amount: Small (1-33%) Exposed Structure Necrotic Amount: Large (67-100%) Fat Layer (Subcutaneous Tissue) Exposed: Yes Necrotic Quality: Eschar, Adherent Therapist, music) Signed: 06/10/2020 4:57:18 PM By: Jonathan Coria RN Entered By: [name] on 06/10/2020 09:55:03 Lottman, [name] ([id number]) -------------------------------------------------------------------------------- Vitals Details [name] Date of Service: 06/10/2020 9:15 AM Medical Record Number: [id number] Patient Account Number: [id number] Date of Birth/Sex: 1934/11/30 (85 y.o. M) Treating RN: [name] Primary Care Lyrah Bradt:  [name] Other Clinician: Referring Jaceon Heiberger: [name] Treating Tyffani Foglesong/Extender: [name] in Treatment: 1 Vital Signs Time Taken: 09:53 Temperature (F): 98.2 Height (in): 71 Pulse (bpm): 62 Weight (lbs): 216 Respiratory Rate (breaths/min): 18 Body Mass Index (BMI): 30.1 Blood Pressure (mmHg): 182/96 Reference Range: 80 - 120 mg / dl Electronic Signature(s) Signed: 06/10/2020 4:57:18 PM By: Jonathan Coria RN Entered By: [name] on 06/10/2020 09:53:16

## 2020-06-13 ENCOUNTER — Other Ambulatory Visit: Payer: Self-pay

## 2020-06-13 ENCOUNTER — Encounter: Payer: Self-pay | Admitting: Family Medicine

## 2020-06-13 ENCOUNTER — Ambulatory Visit (INDEPENDENT_AMBULATORY_CARE_PROVIDER_SITE_OTHER): Payer: Medicare Other | Admitting: Family Medicine

## 2020-06-13 VITALS — BP 151/78 | HR 66 | Temp 97.5°F | Ht 70.0 in | Wt 226.4 lb

## 2020-06-13 DIAGNOSIS — I6930 Unspecified sequelae of cerebral infarction: Secondary | ICD-10-CM | POA: Diagnosis not present

## 2020-06-13 DIAGNOSIS — N401 Enlarged prostate with lower urinary tract symptoms: Secondary | ICD-10-CM

## 2020-06-13 DIAGNOSIS — I10 Essential (primary) hypertension: Secondary | ICD-10-CM

## 2020-06-13 DIAGNOSIS — R7309 Other abnormal glucose: Secondary | ICD-10-CM | POA: Diagnosis not present

## 2020-06-13 DIAGNOSIS — E782 Mixed hyperlipidemia: Secondary | ICD-10-CM

## 2020-06-13 DIAGNOSIS — N138 Other obstructive and reflux uropathy: Secondary | ICD-10-CM

## 2020-06-13 MED ORDER — CLOPIDOGREL BISULFATE 75 MG PO TABS: 75.0000 mg | ORAL_TABLET | Freq: Every day | ORAL | 3 refills | Status: DC

## 2020-06-13 MED ORDER — TERAZOSIN HCL 5 MG PO CAPS: 5.0000 mg | ORAL_CAPSULE | Freq: Every day | ORAL | 3 refills | Status: DC

## 2020-06-13 MED ORDER — ASPIRIN EC 81 MG PO TBEC: 81.0000 mg | DELAYED_RELEASE_TABLET | Freq: Every day | ORAL | 3 refills | Status: DC

## 2020-06-13 MED ORDER — LISINOPRIL 20 MG PO TABS
20.0000 mg | ORAL_TABLET | Freq: Every day | ORAL | 3 refills | Status: DC
Start: 2020-06-13 — End: 2020-06-25

## 2020-06-13 MED ORDER — ATORVASTATIN CALCIUM 40 MG PO TABS: 40.0000 mg | ORAL_TABLET | Freq: Every day | ORAL | 3 refills | Status: DC

## 2020-06-13 NOTE — Progress Notes (Signed)
Subjective:    Patient ID: [name], male    DOB: October 23, 1934, 86 y.o.   MRN: [id number]  [name] is a 86 y.o. male presenting on 06/13/2020 for Annual Exam   HPI   CHRONIC HTN w/ CKD III Out of medications, needs re order. He stopped taking meds. Due for labs. Current Meds -Lisinopril 20mg  daily, Terazosin 5mg  daily Reports good compliance, took meds today. Tolerating well, w/o complaints. Lifestyle: - Diet:balanced diet - Exercise:walking Denies CP, dyspnea, HA, edema, dizziness / lightheadedness  Hyperlipidemia History of CVA Off Atorvastatin 40mg , not taking ASA 81 and Plavix 75mg  was on DAPT for stroke secondary prevention.  BPH / Elevated PSA Previously elevated PSA followed by Urology On Terazosin 5mg  for LUTS He stopped medication   Depression screen [facility] 2/9 06/26/2019 10/18/2018 03/02/2018  Decreased Interest 0 0 0  Down, Depressed, Hopeless 0 0 0  PHQ - 2 Score 0 0 0    Social History   Tobacco Use  . Smoking status: Former Smoker    Quit date: 1985    Years since quitting: 37.1  . Smokeless tobacco: Former Network engineer  . Vaping Use: Never used  Substance Use Topics  . Alcohol use: No  . Drug use: No    Review of Systems Per HPI unless specifically indicated above     Objective:    BP (!) 151/78 (BP Location: Left Arm, Patient Position: Sitting, Cuff Size: Normal)   Pulse 66   Temp (!) 97.5 F (36.4 C) (Temporal)   Ht 5\' 10"  (1.778 m)   Wt 226 lb 6.4 oz (102.7 kg)   SpO2 99%   BMI 32.49 kg/m   Wt Readings from Last 3 Encounters:  06/13/20 226 lb 6.4 oz (102.7 kg)  06/03/20 205 lb 14.6 oz (93.4 kg)  05/23/20 206 lb (93.4 kg)    Physical Exam Vitals and nursing note reviewed.  Constitutional:      General: He is not in acute distress.    Appearance: He is well-developed and well-nourished. He is not diaphoretic.     Comments: Well-appearing, comfortable, cooperative  HENT:     Head: Normocephalic and atraumatic.      Mouth/Throat:     Mouth: Oropharynx is clear and moist.  Eyes:     General:        Right eye: No discharge.        Left eye: No discharge.     Conjunctiva/sclera: Conjunctivae normal.  Cardiovascular:     Rate and Rhythm: Normal rate.  Pulmonary:     Effort: Pulmonary effort is normal.  Musculoskeletal:        General: No edema.  Skin:    General: Skin is warm and dry.     Findings: No erythema or rash.  Neurological:     Mental Status: He is alert and oriented to person, place, and time.  Psychiatric:        Mood and Affect: Mood and affect normal.        Behavior: Behavior normal.     Comments: Well groomed, good eye contact, normal speech and thoughts       Results for orders placed or performed during the hospital encounter of 06/10/20  Aerobic Culture w Gram Stain (superficial specimen)   Specimen: Wound  Result Value Ref Range   Specimen Description      WOUND Performed at [name], [address], [place], [place] [address]  Special Requests      RIGHT HEEL Performed at [facility], [place], [place] [address]    Gram Stain      NO WBC SEEN ABUNDANT GRAM NEGATIVE RODS ABUNDANT GRAM POSITIVE COCCI FEW GRAM POSITIVE RODS    Culture      FEW ESCHERICHIA COLI FEW PSEUDOMONAS AERUGINOSA FEW STAPHYLOCOCCUS AUREUS SUSCEPTIBILITIES TO FOLLOW Performed at [facility], [place] [address] [place], [place] [address]    Report Status PENDING    Organism ID, Bacteria ESCHERICHIA COLI    Organism ID, Bacteria PSEUDOMONAS AERUGINOSA       Susceptibility   Escherichia coli - MIC*    AMPICILLIN >=32 RESISTANT Resistant     CEFAZOLIN <=4 SENSITIVE Sensitive     CEFEPIME <=0.12 SENSITIVE Sensitive     CEFTAZIDIME <=1 SENSITIVE Sensitive     CEFTRIAXONE <=0.25 SENSITIVE Sensitive     CIPROFLOXACIN <=0.25 SENSITIVE Sensitive     GENTAMICIN <=1 SENSITIVE Sensitive     IMIPENEM <=0.25 SENSITIVE Sensitive     TRIMETH/SULFA  <=20 SENSITIVE Sensitive     AMPICILLIN/SULBACTAM >=32 RESISTANT Resistant     PIP/TAZO <=4 SENSITIVE Sensitive     * FEW ESCHERICHIA COLI   Pseudomonas aeruginosa - MIC*    CEFTAZIDIME 4 SENSITIVE Sensitive     CIPROFLOXACIN <=0.25 SENSITIVE Sensitive     GENTAMICIN <=1 SENSITIVE Sensitive     IMIPENEM 1 SENSITIVE Sensitive     PIP/TAZO 8 SENSITIVE Sensitive     CEFEPIME 2 SENSITIVE Sensitive     * FEW PSEUDOMONAS AERUGINOSA      Assessment & Plan:   Problem List Items Addressed This Visit    HLD (hyperlipidemia)   Relevant Medications   atorvastatin (LIPITOR) 40 MG tablet   lisinopril (ZESTRIL) 20 MG tablet   terazosin (HYTRIN) 5 MG capsule   aspirin EC 81 MG tablet   Other Relevant Orders   Hemoglobin A1c   COMPLETE METABOLIC PANEL WITH GFR   Lipid panel   History of cerebrovascular accident (CVA) with residual deficit   Relevant Medications   clopidogrel (PLAVIX) 75 MG tablet   aspirin EC 81 MG tablet   Other Relevant Orders   CBC with Differential/Platelet   BPH with obstruction/lower urinary tract symptoms   Relevant Medications   terazosin (HYTRIN) 5 MG capsule   Other Relevant Orders   PSA    Other Visit Diagnoses    Essential hypertension       Relevant Medications   atorvastatin (LIPITOR) 40 MG tablet   lisinopril (ZESTRIL) 20 MG tablet   terazosin (HYTRIN) 5 MG capsule   aspirin EC 81 MG tablet   Other Relevant Orders   Hemoglobin A1c   CBC with Differential/Platelet   COMPLETE METABOLIC PANEL WITH GFR      #Hx CVA, residual deficit Previously on DAPT Plavix 75mg  and ASA 81mg  daily  #HTN Elevated BP in office. Off medication  #HLD Off Statin medication, needs to restart.  #BPH Chronic issue with LUTS, difficulty with voiding. Weaker stream urinary frequency Re start Terazosin 5mg  daily  Multiple chronic medication conditions, inadequately controlled, was lost to follow-up and now non adherent to medications. Previously briefly in contact  with Pam through [place] program but has been unable to be reached back in 2021 and this was not pursued.  Orders Placed This Encounter  Procedures  . Hemoglobin A1c  . CBC with Differential/Platelet  . COMPLETE METABOLIC PANEL WITH GFR  . Lipid panel  Order Specific Question:   Has the patient fasted?    Answer:   Yes  . PSA  . AMB Referral to [facility] Coordinaton    Referral Priority:   Routine    Referral Type:   Consultation    Referral Reason:   Care Coordination    Number of Visits Requested:   1      Meds ordered this encounter  Medications  . atorvastatin (LIPITOR) 40 MG tablet    Sig: Take 1 tablet (40 mg total) by mouth at bedtime.    Dispense:  90 tablet    Refill:  3  . clopidogrel (PLAVIX) 75 MG tablet    Sig: Take 1 tablet (75 mg total) by mouth daily.    Dispense:  90 tablet    Refill:  3  . lisinopril (ZESTRIL) 20 MG tablet    Sig: Take 1 tablet (20 mg total) by mouth daily.    Dispense:  90 tablet    Refill:  3  . terazosin (HYTRIN) 5 MG capsule    Sig: Take 1 capsule (5 mg total) by mouth at bedtime.    Dispense:  90 capsule    Refill:  3  . aspirin EC 81 MG tablet    Sig: Take 1 tablet (81 mg total) by mouth daily.    Dispense:  90 tablet    Refill:  3      Follow up plan: Return in about 6 months (around 12/11/2020) for 6 month follow-up HTN, HLD, BPH.   Nobie Putnam, DO [facility] 06/13/2020, 11:01 AM

## 2020-06-13 NOTE — Patient Instructions (Addendum)
Thank you for coming to the office today.  Labs today. Stay tuned for result. We will call next week.  Restart these medications, list is updated.  Care Management Team - through [facility] and Jerene Pitch will call you at some point to check in.  Please schedule a Follow-up Appointment to: Return in about 6 months (around 12/11/2020) for 6 month follow-up HTN, HLD, BPH.  If you have any other questions or concerns, please feel free to call the office or send a message through [place]. You may also schedule an earlier appointment if necessary.  Additionally, you may be receiving a survey about your experience at our office within a few days to 1 week by e-mail or mail. We value your feedback.  Nobie Putnam, DO [place]

## 2020-06-14 LAB — CBC WITH DIFFERENTIAL/PLATELET
Absolute Monocytes: 631 cells/uL (ref 200–950)
Basophils Absolute: 39 cells/uL (ref 0–200)
Basophils Relative: 0.5 %
Eosinophils Absolute: 277 cells/uL (ref 15–500)
Eosinophils Relative: 3.6 %
HCT: 35 % — ABNORMAL LOW (ref 38.5–50.0)
Hemoglobin: 12.1 g/dL — ABNORMAL LOW (ref 13.2–17.1)
Lymphs Abs: 1725 cells/uL (ref 850–3900)
MCH: 32.4 pg (ref 27.0–33.0)
MCHC: 34.6 g/dL (ref 32.0–36.0)
MCV: 93.8 fL (ref 80.0–100.0)
MPV: 9 fL (ref 7.5–12.5)
Monocytes Relative: 8.2 %
Neutro Abs: 5028 cells/uL (ref 1500–7800)
Neutrophils Relative %: 65.3 %
Platelets: 194 10*3/uL (ref 140–400)
RBC: 3.73 10*6/uL — ABNORMAL LOW (ref 4.20–5.80)
RDW: 12.4 % (ref 11.0–15.0)
Total Lymphocyte: 22.4 %
WBC: 7.7 10*3/uL (ref 3.8–10.8)

## 2020-06-14 LAB — LIPID PANEL
Cholesterol: 163 mg/dL (ref <200)
HDL: 37 mg/dL — ABNORMAL LOW (ref >=40)
LDL Cholesterol (Calc): 106 mg/dL (calc) — ABNORMAL HIGH
Non-HDL Cholesterol (Calc): 126 mg/dL (calc) (ref <130)
Total CHOL/HDL Ratio: 4.4 (calc) (ref <5.0)
Triglycerides: 100 mg/dL (ref <150)

## 2020-06-14 LAB — COMPLETE METABOLIC PANEL WITH GFR
AG Ratio: 1 (calc) (ref 1.0–2.5)
ALT: 14 U/L (ref 9–46)
AST: 17 U/L (ref 10–35)
Albumin: 3.2 g/dL — ABNORMAL LOW (ref 3.6–5.1)
Alkaline phosphatase (APISO): 73 U/L (ref 35–144)
BUN/Creatinine Ratio: 19 (calc) (ref 6–22)
BUN: 24 mg/dL (ref 7–25)
CO2: 33 mmol/L — ABNORMAL HIGH (ref 20–32)
Calcium: 8.1 mg/dL — ABNORMAL LOW (ref 8.6–10.3)
Chloride: 104 mmol/L (ref 98–110)
Creat: 1.25 mg/dL — ABNORMAL HIGH (ref 0.70–1.11)
GFR, Est African American: 61 mL/min/{1.73_m2} (ref >=60)
GFR, Est Non African American: 53 mL/min/{1.73_m2} — ABNORMAL LOW (ref >=60)
Globulin: 3.3 g/dL (calc) (ref 1.9–3.7)
Glucose, Bld: 107 mg/dL — ABNORMAL HIGH (ref 65–99)
Potassium: 4.3 mmol/L (ref 3.5–5.3)
Sodium: 142 mmol/L (ref 135–146)
Total Bilirubin: 0.6 mg/dL (ref 0.2–1.2)
Total Protein: 6.5 g/dL (ref 6.1–8.1)

## 2020-06-14 LAB — HEMOGLOBIN A1C
Hgb A1c MFr Bld: 5.8 % of total Hgb — ABNORMAL HIGH (ref <5.7)
Mean Plasma Glucose: 120 mg/dL
eAG (mmol/L): 6.6 mmol/L

## 2020-06-14 LAB — PSA: PSA: 24.34 ng/mL — ABNORMAL HIGH (ref <=4.0)

## 2020-06-15 LAB — AEROBIC CULTURE W GRAM STAIN (SUPERFICIAL SPECIMEN): Gram Stain: NONE SEEN

## 2020-06-17 ENCOUNTER — Ambulatory Visit: Payer: Medicare Other | Admitting: Physician Assistant

## 2020-06-17 ENCOUNTER — Telehealth: Payer: Self-pay

## 2020-06-17 NOTE — Chronic Care Management (AMB) (Signed)
  Care Management   Note  06/17/2020 Name: [name] MRN: [id number] DOB: 1934/09/17  [name] is a 86 y.o. year old male who is a primary care patient of [name], DO and is actively engaged with the care management team. I reached out to [name] by phone today to assist with re-scheduling a follow up visit with the RN Case Manager Pharmacist Licensed Clinical Social Worker  Follow up plan: Telephone appointment with care management team member scheduled for: Licensed Clinical SW 06/19/2020 Pharm D 06/24/2020 RN CM 07/04/2020  [name], [place], [facility]  [place], [place] [address] Direct Dial: [phone] [email or web address] Website: [email or web address]

## 2020-06-18 ENCOUNTER — Telehealth: Payer: Self-pay | Admitting: Family Medicine

## 2020-06-18 ENCOUNTER — Other Ambulatory Visit: Payer: Self-pay | Admitting: Family Medicine

## 2020-06-18 DIAGNOSIS — R972 Elevated prostate specific antigen [PSA]: Secondary | ICD-10-CM

## 2020-06-18 NOTE — Telephone Encounter (Signed)
Patient was seen recently, we did discuss this particular concern.  He was referred to Chronic Care Management services through our office.  Multiple chronic medication conditions, inadequately controlled, was lost to follow-up and now non adherent to medications. Previously briefly in contact with Pam through [place] program but has been unable to be reached back in 2021 and this was not pursued.  He does have some cognitive symptoms, but we would need further input from CCM team regarding how he is managing in other areas of social / daily function to help determine further advice or level of care if he needs more help at home or other options for him.  Nobie Putnam, [facility] 06/18/2020, 4:19 PM

## 2020-06-18 NOTE — Telephone Encounter (Signed)
Called to speak with the nurse or doctor regarding patient's cognitive capabilities.  She stated that there has been a decline and would like to know from the doctor if he should have a referral for different care.  Please advise and call to discuss at [phone]

## 2020-06-19 ENCOUNTER — Ambulatory Visit (INDEPENDENT_AMBULATORY_CARE_PROVIDER_SITE_OTHER): Payer: Medicare Other | Admitting: Licensed Clinical Social Worker

## 2020-06-19 ENCOUNTER — Telehealth: Payer: Self-pay | Admitting: Licensed Clinical Social Worker

## 2020-06-19 DIAGNOSIS — N183 Chronic kidney disease, stage 3 unspecified: Secondary | ICD-10-CM

## 2020-06-19 DIAGNOSIS — E782 Mixed hyperlipidemia: Secondary | ICD-10-CM

## 2020-06-19 DIAGNOSIS — I129 Hypertensive chronic kidney disease with stage 1 through stage 4 chronic kidney disease, or unspecified chronic kidney disease: Secondary | ICD-10-CM

## 2020-06-19 DIAGNOSIS — I10 Essential (primary) hypertension: Secondary | ICD-10-CM

## 2020-06-19 NOTE — Telephone Encounter (Signed)
  Chronic Care Management    Clinical Social Work General Follow Up Note  06/19/2020 Name: [name] MRN: [id number] DOB: 09/13/1934  [name] is a 86 y.o. year old male who is a primary care patient of [name], DO. The CCM team was consulted for assistance with Level of Care Concerns.   Review of patient status, including review of consultants reports, relevant laboratory and other test results, and collaboration with appropriate care team members and the patient's provider was performed as part of comprehensive patient evaluation and provision of chronic care management services.    [name] completed CCM outreach attempt today but was unable to reach patient successfully. A HIPPA compliant voice message was unable to be left encouraging patient to return call once available. [name] will reschedule patient's CCM Social Work appointment if no return call has been made.  Outpatient Encounter Medications as of 06/19/2020  Medication Sig  . aspirin EC 81 MG tablet Take 1 tablet (81 mg total) by mouth daily.  Marland Kitchen atorvastatin (LIPITOR) 40 MG tablet Take 1 tablet (40 mg total) by mouth at bedtime.  . clopidogrel (PLAVIX) 75 MG tablet Take 1 tablet (75 mg total) by mouth daily.  Marland Kitchen lisinopril (ZESTRIL) 20 MG tablet Take 1 tablet (20 mg total) by mouth daily.  Marland Kitchen terazosin (HYTRIN) 5 MG capsule Take 1 capsule (5 mg total) by mouth at bedtime.   No facility-administered encounter medications on file as of 06/19/2020.    Follow Up Plan: SW will follow up with patient by phone over the next quarter   Eula Fried, [place], MSW, [email or web address] Phone: [phone]

## 2020-06-19 NOTE — Chronic Care Management (AMB) (Signed)
Chronic Care Management    Clinical Social Work Note  06/19/2020 Name: [name] MRN: [id number] DOB: 04-15-1935  [name] is a 86 y.o. year old male who is a primary care patient of [name], DO. The CCM team was consulted to assist the patient with chronic disease management and/or care coordination needs related to: Level of Care Concerns.   Engaged contact with sister of patient for follow up visit in response to provider referral for social work chronic care management and care coordination services.   Consent to Services:  The patient was given the following information about Chronic Care Management services today, agreed to services, and gave verbal consent: 1. CCM service includes personalized support from designated clinical staff supervised by the primary care provider, including individualized plan of care and coordination with other care providers 2. 24/7 contact phone numbers for assistance for urgent and routine care needs. 3. Service will only be billed when office clinical staff spend 20 minutes or more in a month to coordinate care. 4. Only one practitioner may furnish and bill the service in a calendar month. 5.The patient may stop CCM services at any time (effective at the end of the month) by phone call to the office staff. 6. The patient will be responsible for cost sharing (co-pay) of up to 20% of the service fee (after annual deductible is met). Patient agreed to services and consent obtained.  Patient agreed to services and consent obtained.   Assessment: Review of patient past medical history, allergies, medications, and health status, including review of relevant consultants reports was performed today as part of a comprehensive evaluation and provision of chronic care management and care coordination services.     SDOH (Social Determinants of Health) assessments and interventions performed:    Advanced Directives Status: Not addressed in this  encounter.  CCM Care Plan  No Known Allergies  Outpatient Encounter Medications as of 06/19/2020  Medication Sig   aspirin EC 81 MG tablet Take 1 tablet (81 mg total) by mouth daily.   atorvastatin (LIPITOR) 40 MG tablet Take 1 tablet (40 mg total) by mouth at bedtime.   clopidogrel (PLAVIX) 75 MG tablet Take 1 tablet (75 mg total) by mouth daily.   lisinopril (ZESTRIL) 20 MG tablet Take 1 tablet (20 mg total) by mouth daily.   terazosin (HYTRIN) 5 MG capsule Take 1 capsule (5 mg total) by mouth at bedtime.   No facility-administered encounter medications on file as of 06/19/2020.    Patient Active Problem List   Diagnosis Date Noted   Imbalance 02/01/2018   Humeral head fracture, left, closed, initial encounter 09/22/2017   History of cerebrovascular accident (CVA) with residual deficit 09/09/2017   CKD (chronic kidney disease), stage III ([place]) 09/01/2017   Elevated PSA, between 10 and less than 20 ng/ml 07/22/2016   Bilateral lower extremity edema 07/22/2016   Abnormal glucose 07/15/2016   BPH with obstruction/lower urinary tract symptoms 04/10/2016   Benign hypertension with CKD (chronic kidney disease) stage III ([place]) 04/10/2016   Memory loss 04/10/2016   Gastric outlet obstruction 12/02/2014   Generalized abdominal pain    Benign fibroma of prostate 01/02/2011   HLD (hyperlipidemia) 01/02/2011   Obesity (BMI 30.0-34.9) 01/02/2011   Care Plan : General Social Work (Adult)  Updates made by Greg Cutter, [name] since 06/19/2020 12:00 AM    Problem: Quality of Life (General Plan of Care)     Long-Range Goal: Quality of Life Maintained  Start Date: 06/19/2020  Priority: Medium  Note:   Timeframe:  Long-Range Goal Priority:  Medium Start Date:   06/19/20                     Expected End Date: 08/19/20                    Follow Up Date- 07/23/20  Current Barrers:   Limited social support  ADL IADL limitations  Social Isolation  Limited access to  caregiver  Inability to perform ADL's independently  Inability to perform IADL's independently  Lacks knowledge of community resource: personal care services  Clinical Social Work Clinical Goal(s):   Over the next 120 days, patient will work with SW to address concerns related to lack of care within the home  Interventions:  Patient and family interviewed and appropriate assessments performed  CCM [name] was unable to reach patient or spouse but successfully spoke with his sister on 06/19/20. Sister reports that patient is doing better and that they go to church together each Sunday. She reports that she cares a lot for her brother and is there for him when needed but she is 86 years old and has her own health concerns.    Provided patient and family with information about available personal care services within the area such as C.H.O.R.E program. Patient and family feel that he will not be eligible for Medicaid.  Discussed plans with patient for ongoing care management follow up and provided patient with direct contact information for care management team  Advised patient to considering hiring a private pay caregiver as Medicare does not cover these services  Assisted patient/caregiver with obtaining information about health plan benefits  Provided education and assistance to client regarding Advanced Directives.  Provided education to patient/caregiver regarding level of care options.  Provided education to patient/caregiver about Hospice and/or Palliative Care services  [name] will place patient on wait list for C.H.O.R.E program during next scheduled CCM appointment---Application completed and patient was successfully added on the wait list for C.H.O.R.E program on 08/08/19. Patient was provided this update. Patient was provided Home Care Providers contact information in the case that he wishes to inquire where he is at on the wait list. Sister was provided this information as well on  06/19/20  Patient Self Care Activities:   Attends all scheduled provider appointments  Calls provider office for new concerns or questions  Lacks social connections  Unable to perform ADLs independently  Unable to perform IADLs independently  Please see past updates related to this goal by clicking on the "Past Updates" button in the selected goal    Task: Support and Maintain Acceptable Degree of Health, Comfort and Happiness   Note:   Care Management Activities:    - affirmation provided - community involvement promoted - expression of thoughts about present/future encouraged - independence in all possible areas promoted - life review by storytelling encouraged - patient strengths promoted - psychosocial concerns monitored - self-expression encouraged - sleep diary encouraged - sleep hygiene techniques encouraged - social relationships promoted - strategies to maintain hearing and/or vision promoted - strategies to maintain intimacy promoted - wellness behaviors promoted        Follow Up Plan: SW will follow up with patient by phone over the next month      Eula Fried, [place], MSW, [email or web address] Phone: [phone]

## 2020-06-19 NOTE — Patient Instructions (Signed)
Licensed Clinical Social Worker Visit Information  Goals we discussed today:  Goals Addressed              This Visit's Progress   .  "I need help with caregiving in the home" (pt-stated)        Timeframe:  Long-Range Goal Priority:  Medium Start Date:   06/19/20                     Expected End Date: 08/19/20                    Follow Up Date- 07/23/20  Current Barrers:  . Limited social support . ADL IADL limitations . Social Isolation . Limited access to caregiver . Inability to perform ADL's independently . Inability to perform IADL's independently . Lacks knowledge of community resource: personal care services  Clinical Social Work Clinical Goal(s):  Marland Kitchen Over the next 120 days, patient will work with SW to address concerns related to lack of care within the home  Interventions: . Patient and family interviewed and appropriate assessments performed . CCM [name] was unable to reach patient or spouse but successfully spoke with his sister on 06/19/20. Sister reports that patient is doing better and that they go to church together each Sunday. She reports that she cares a lot for her brother and is there for him when needed but she is 86 years old and has her own health concerns.   . Provided patient and family with information about available personal care services within the area such as C.H.O.R.E program. Patient and family feel that he will not be eligible for Medicaid. . Discussed plans with patient for ongoing care management follow up and provided patient with direct contact information for care management team . Advised patient to considering hiring a private pay caregiver as Medicare does not cover these services . Assisted patient/caregiver with obtaining information about health plan benefits . Provided education and assistance to client regarding Advanced Directives. . Provided education to patient/caregiver regarding level of care options. . Provided education to  patient/caregiver about Hospice and/or Palliative Care services . [name] will place patient on wait list for C.H.O.R.E program during next scheduled CCM appointment---Application completed and patient was successfully added on the wait list for C.H.O.R.E program on 08/08/19. Patient was provided this update. Patient was provided Home Care Providers contact information in the case that he wishes to inquire where he is at on the wait list. Sister was provided this information as well on 06/19/20  Patient Self Care Activities:  . Attends all scheduled provider appointments . Calls provider office for new concerns or questions . Lacks social connections . Unable to perform ADLs independently . Unable to perform IADLs independently  Please see past updates related to this goal by clicking on the "Past Updates" button in the selected goal         Eula Fried, [place], MSW, [email or web address] Phone: [phone]

## 2020-06-19 NOTE — Telephone Encounter (Signed)
I attempted to contact Gibson. She was with a patient. I informed the receptionist that I will send her a secure chat message. I sent a secure chat message to notify her that Dr. Parks Ranger recently seen the patient in the office and is aware of his decline. The patient was referred to Chronic Care Management services through our office.

## 2020-06-24 ENCOUNTER — Telehealth: Payer: Medicare Other

## 2020-06-24 ENCOUNTER — Telehealth: Payer: Self-pay | Admitting: Pharmacist

## 2020-06-24 ENCOUNTER — Ambulatory Visit: Payer: Self-pay | Admitting: General Practice

## 2020-06-24 ENCOUNTER — Other Ambulatory Visit: Payer: Self-pay

## 2020-06-24 ENCOUNTER — Encounter: Payer: Medicare Other | Attending: Physician Assistant | Admitting: Physician Assistant

## 2020-06-24 DIAGNOSIS — E782 Mixed hyperlipidemia: Secondary | ICD-10-CM

## 2020-06-24 DIAGNOSIS — R296 Repeated falls: Secondary | ICD-10-CM | POA: Insufficient documentation

## 2020-06-24 DIAGNOSIS — N183 Chronic kidney disease, stage 3 unspecified: Secondary | ICD-10-CM

## 2020-06-24 DIAGNOSIS — L97513 Non-pressure chronic ulcer of other part of right foot with necrosis of muscle: Secondary | ICD-10-CM

## 2020-06-24 DIAGNOSIS — I129 Hypertensive chronic kidney disease with stage 1 through stage 4 chronic kidney disease, or unspecified chronic kidney disease: Secondary | ICD-10-CM | POA: Diagnosis not present

## 2020-06-24 DIAGNOSIS — I10 Essential (primary) hypertension: Secondary | ICD-10-CM

## 2020-06-24 DIAGNOSIS — Z7901 Long term (current) use of anticoagulants: Secondary | ICD-10-CM | POA: Insufficient documentation

## 2020-06-24 DIAGNOSIS — L89610 Pressure ulcer of right heel, unstageable: Secondary | ICD-10-CM | POA: Insufficient documentation

## 2020-06-24 DIAGNOSIS — N138 Other obstructive and reflux uropathy: Secondary | ICD-10-CM | POA: Diagnosis not present

## 2020-06-24 DIAGNOSIS — N401 Enlarged prostate with lower urinary tract symptoms: Secondary | ICD-10-CM

## 2020-06-24 DIAGNOSIS — L97419 Non-pressure chronic ulcer of right heel and midfoot with unspecified severity: Secondary | ICD-10-CM | POA: Diagnosis present

## 2020-06-24 NOTE — Progress Notes (Addendum)
AMADI, FRADY ([id number]) Visit Report for 06/24/2020 Chief Complaint Document Details [name], [name] Date of Service: 06/24/2020 9:15 AM Medical Record Number: [id number] Patient Account Number: [id number] Date of Birth/Sex: 05/02/1934 (85 y.o. M) Treating RN: Carlene Coria Primary Care Provider: Nobie Putnam Other Clinician: Jeanine Luz Referring Provider: Nobie Putnam Treating Provider/Extender: Skipper Cliche in Treatment: 3 Information Obtained from: Patient Chief Complaint Right heel ulcer Electronic Signature(s) Signed: 06/24/2020 10:01:21 AM By: Worthy Keeler PA-C Entered By: Worthy Keeler on 06/24/2020 10:01:21 Leete, [name] ([id number]) -------------------------------------------------------------------------------- HPI Details [name] Date of Service: 06/24/2020 9:15 AM Medical Record Number: [id number] Patient Account Number: [id number] Date of Birth/Sex: 1935/03/25 (85 y.o. M) Treating RN: Carlene Coria Primary Care Provider: Nobie Putnam Other Clinician: Jeanine Luz Referring Provider: Nobie Putnam Treating Provider/Extender: Skipper Cliche in Treatment: 3 History of Present Illness HPI Description: 06/03/2020 upon evaluation today patient actually presents for initial inspection here in our clinic after having gone to urgent care this morning where they were concerned about infection as well as a significant "softball sized wound with eschar" over the heel. With that being said the patient fortunately does not have an area that largely does have an area of eschar which is draining around the edges really not stable and I think we are getting to remove the eschar so we try to clean up the surface of the wound. Fortunately there is no evidence of active infection at this time. I do believe that he is going require some debridement both manually today with scissors and forceps as well  as enzymatic/chemically with the medications. I really feel like that he has a good chance to get this to heal if he keep pressure off of it we did discuss that again today. He does note this has been present for about 2 months. He is not taking any of his current medications that we did put him on the list he tells me that he only occasionally takes them which is obviously not doing anything for him. He does have a cane but he does not use it and his ABI was 1.14 on the right. 06/10/2020 upon evaluation today patient appears to be doing well with regard to his wound all things considered. He still has a lot of necrotic tissue in the central portion of the wound. I think the Iodosorb is still the right thing to do and again the biggest issue is he quit using it as he and his wife felt like that the drainage following putting on the medicine was really "nasty". Nonetheless I explained that the weight Iodoflex breaks down or Iodosorb and that round does make it look really bad but it actually is not as bad as what it seems. In fact it is the normal course of things. 06/24/2020 upon evaluation today patient appears to be doing unfortunately about the same in regard to his heel. I do not think this is any worse. However its become increasingly clear that he has significant dementia in my opinion. We have had multiple instances going on here from the standpoint of trying to get him to take the oral antibiotic. Initially we ordered it for him he picked it up on 1 afternoon and by the next morning we called to make sure that he had gotten it he told us yes but that he had thrown it away. That he was not to take any additional medications. We then told him he needed  to be taken this to get the infection under control he subsequently look for it only found the bottle never took the medication therefore I sent it in a second time for him. After sending this and it looks like he has not really taken it since that  time he tells me that he took 7 pills at once although I do not think they were all of the same medication and then following this it made him sick I think these were all of his different medicines. He decided he was not to take any further medication after that point. Nonetheless I do believe he still is going require treatment for this infection the good news is he has a friend who is a very good friend that is with him here today to try to help with figuring out what to do and how going forward this should be taken care of. Electronic Signature(s) Signed: 06/24/2020 11:21:57 AM By: Worthy Keeler PA-C Entered By: Worthy Keeler on 06/24/2020 11:21:57 Rasp, [name] ([id number]) -------------------------------------------------------------------------------- Physical Exam Details [name] Date of Service: 06/24/2020 9:15 AM Medical Record Number: [id number] Patient Account Number: [id number] Date of Birth/Sex: 10-11-1934 (85 y.o. M) Treating RN: Carlene Coria Primary Care Provider: Nobie Putnam Other Clinician: Jeanine Luz Referring Provider: Nobie Putnam Treating Provider/Extender: Skipper Cliche in Treatment: 3 Constitutional Well-nourished and well-hydrated in no acute distress. Respiratory normal breathing without difficulty. Psychiatric this patient is able to make decisions and demonstrates good insight into disease process. Alert and Oriented x 3. pleasant and cooperative. Notes Upon inspection patient appears to be doing about the same in regard to the heel ulcer there is still eschar noted which is good to require debridement. Right now we will try to let the Iodoflex work on this. He has not been using Iodoflex he cannot really give me a reason every time I skin he keeps going back to tell me about the pills. Nonetheless I think he needs to take the antibiotic pills that he also needs to appropriately take his dressings and apply  them. Electronic Signature(s) Signed: 06/24/2020 11:23:12 AM By: Worthy Keeler PA-C Entered By: Worthy Keeler on 06/24/2020 11:23:11 Learn, [name] ([id number]) -------------------------------------------------------------------------------- Physician Orders Details [name] Date of Service: 06/24/2020 9:15 AM Medical Record Number: [id number] Patient Account Number: [id number] Date of Birth/Sex: 1934/11/26 (85 y.o. M) Treating RN: Carlene Coria Primary Care Provider: Nobie Putnam Other Clinician: Jeanine Luz Referring Provider: Nobie Putnam Treating Provider/Extender: Skipper Cliche in Treatment: 3 Verbal / Phone Orders: No Diagnosis Coding ICD-10 Coding Code Description L89.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, unstageable R29.6 Repeated falls Z79.01 Long term (current) use of anticoagulants Follow-up Appointments o Return Appointment in 1 week. Bathing/ Shower/ Hygiene o May shower; gently cleanse wound with antibacterial soap, rinse and pat dry prior to dressing wounds Edema Control - Lymphedema / Segmental Compressive Device / Other o Elevate, Exercise Daily and Avoid Standing for Long Periods of Time. o Elevate legs to the level of the heart and pump ankles as often as possible o Elevate leg(s) parallel to the floor when sitting. Wound Treatment Wound #1 - Foot Wound Laterality: Right, Lateral Cleanser: Byram Ancillary Kit - 15 Day Supply (Generic) 3 x Per Week/30 Days Discharge Instructions: Use supplies as instructed; Kit contains: (15) Saline Bullets; (15) 3x3 Gauze; 15 pr Gloves Cleanser: Normal Saline (Generic) 3 x Per Week/30 Days Discharge Instructions: Wash your hands with soap and water. Remove  old dressing, discard into plastic bag and place into trash. Cleanse the wound with Normal Saline prior to applying a clean dressing using gauze sponges, not tissues or cotton balls. Do not scrub or use excessive force. Pat  dry using gauze sponges, not tissue or cotton balls. Primary Dressing: IODOFLEX 0.9% Cadexomer Iodine Pad (Generic) 3 x Per Week/30 Days Discharge Instructions: Apply Iodoflex to wound bed only as directed. Secondary Dressing: ABD Pad 5x9 (in/in) (Generic) 3 x Per Week/30 Days Discharge Instructions: Cover with ABD pad Secondary Dressing: Kerlix 4.5 x 4.1 (in/yd) (Generic) 3 x Per Week/30 Days Discharge Instructions: Apply Kerlix 4.5 x 4.1 (in/yd) as instructed Secured With: Tubigrip Size E, 3.5x10 (in/yds) (Generic) 3 x Per Week/30 Days Discharge Instructions: Apply 3 Tubigrip E 3-finger-widths below knee to base of toes to secure dressing and/or for swelling. Electronic Signature(s) Signed: 06/24/2020 12:29:11 PM By: Worthy Keeler PA-C Signed: 06/26/2020 12:09:52 PM By: Carlene Coria RN Entered By: Carlene Coria on 06/24/2020 10:10:07 Horine, [name] ([id number]) -------------------------------------------------------------------------------- Problem List Details [name] Date of Service: 06/24/2020 9:15 AM Medical Record Number: [id number] Patient Account Number: [id number] Date of Birth/Sex: Dec 04, 1934 (85 y.o. M) Treating RN: Carlene Coria Primary Care Provider: Nobie Putnam Other Clinician: Jeanine Luz Referring Provider: Nobie Putnam Treating Provider/Extender: Skipper Cliche in Treatment: 3 Active Problems ICD-10 Encounter Code Description Active Date MDM Diagnosis L89.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, unstageable 06/03/2020 No Yes R29.6 Repeated falls 06/03/2020 No Yes Z79.01 Long term (current) use of anticoagulants 06/03/2020 No Yes Inactive Problems Resolved Problems Electronic Signature(s) Signed: 06/24/2020 10:01:14 AM By: Worthy Keeler PA-C Entered By: Worthy Keeler on 06/24/2020 10:01:13 Schrom, [name] ([id number]) -------------------------------------------------------------------------------- Progress Note Details Patient  Name: [name] Date of Service: 06/24/2020 9:15 AM Medical Record Number: [id number] Patient Account Number: [id number] Date of Birth/Sex: 06/11/1934 (85 y.o. M) Treating RN: Carlene Coria Primary Care Provider: Nobie Putnam Other Clinician: Jeanine Luz Referring Provider: Nobie Putnam Treating Provider/Extender: Skipper Cliche in Treatment: 3 Subjective Chief Complaint Information obtained from Patient Right heel ulcer History of Present Illness (HPI) 06/03/2020 upon evaluation today patient actually presents for initial inspection here in our clinic after having gone to urgent care this morning where they were concerned about infection as well as a significant "softball sized wound with eschar" over the heel. With that being said the patient fortunately does not have an area that largely does have an area of eschar which is draining around the edges really not stable and I think we are getting to remove the eschar so we try to clean up the surface of the wound. Fortunately there is no evidence of active infection at this time. I do believe that he is going require some debridement both manually today with scissors and forceps as well as enzymatic/chemically with the medications. I really feel like that he has a good chance to get this to heal if he keep pressure off of it we did discuss that again today. He does note this has been present for about 2 months. He is not taking any of his current medications that we did put him on the list he tells me that he only occasionally takes them which is obviously not doing anything for him. He does have a cane but he does not use it and his ABI was 1.14 on the right. 06/10/2020 upon evaluation today patient appears to be doing well with regard to his wound all things considered. He  still has a lot of necrotic tissue in the central portion of the wound. I think the Iodosorb is still the right thing to do and again the  biggest issue is he quit using it as he and his wife felt like that the drainage following putting on the medicine was really "nasty". Nonetheless I explained that the weight Iodoflex breaks down or Iodosorb and that round does make it look really bad but it actually is not as bad as what it seems. In fact it is the normal course of things. 06/24/2020 upon evaluation today patient appears to be doing unfortunately about the same in regard to his heel. I do not think this is any worse. However its become increasingly clear that he has significant dementia in my opinion. We have had multiple instances going on here from the standpoint of trying to get him to take the oral antibiotic. Initially we ordered it for him he picked it up on 1 afternoon and by the next morning we called to make sure that he had gotten it he told us yes but that he had thrown it away. That he was not to take any additional medications. We then told him he needed to be taken this to get the infection under control he subsequently look for it only found the bottle never took the medication therefore I sent it in a second time for him. After sending this and it looks like he has not really taken it since that time he tells me that he took 7 pills at once although I do not think they were all of the same medication and then following this it made him sick I think these were all of his different medicines. He decided he was not to take any further medication after that point. Nonetheless I do believe he still is going require treatment for this infection the good news is he has a friend who is a very good friend that is with him here today to try to help with figuring out what to do and how going forward this should be taken care of. Objective Constitutional Well-nourished and well-hydrated in no acute distress. Vitals Time Taken: 9:35 AM, Height: 71 in, Weight: 216 lbs, BMI: 30.1, Temperature: 98.6 F, Pulse: 78 bpm, Respiratory  Rate: 20 breaths/min, Blood Pressure: 192/91 mmHg. Respiratory normal breathing without difficulty. Psychiatric this patient is able to make decisions and demonstrates good insight into disease process. Alert and Oriented x 3. pleasant and cooperative. General Notes: Upon inspection patient appears to be doing about the same in regard to the heel ulcer there is still eschar noted which is good to require debridement. Right now we will try to let the Iodoflex work on this. He has not been using Iodoflex he cannot really give me a reason every time I skin he keeps going back to tell me about the pills. Nonetheless I think he needs to take the antibiotic pills that he also needs to appropriately take his dressings and apply them. Integumentary (Hair, Skin) Wound #1 status is Open. Original cause of wound was Pressure Injury. The date acquired was: 04/03/2020. The wound has been in treatment 3 Kunzler, [name] ([id number]) weeks. The wound is located on the Right,Lateral Foot. The wound measures 5cm length x 5.5cm width x 0.4cm depth; 21.598cm^2 area and 8.639cm^3 volume. There is Fat Layer (Subcutaneous Tissue) exposed. There is no tunneling or undermining noted. There is a medium amount of serosanguineous drainage noted. Foul odor after cleansing  was noted. The wound margin is thickened. There is small (1-33%) pink granulation within the wound bed. There is a large (67-100%) amount of necrotic tissue within the wound bed including Eschar and Adherent Slough. Assessment Active Problems ICD-10 Pressure ulcer of right heel, unstageable Repeated falls Long term (current) use of anticoagulants Plan Follow-up Appointments: Return Appointment in 1 week. Bathing/ Shower/ Hygiene: May shower; gently cleanse wound with antibacterial soap, rinse and pat dry prior to dressing wounds Edema Control - Lymphedema / Segmental Compressive Device / Other: Elevate, Exercise Daily and Avoid Standing for Long  Periods of Time. Elevate legs to the level of the heart and pump ankles as often as possible Elevate leg(s) parallel to the floor when sitting. WOUND #1: - Foot Wound Laterality: Right, Lateral Cleanser: Byram Ancillary Kit - 15 Day Supply (Generic) 3 x Per Week/30 Days Discharge Instructions: Use supplies as instructed; Kit contains: (15) Saline Bullets; (15) 3x3 Gauze; 15 pr Gloves Cleanser: Normal Saline (Generic) 3 x Per Week/30 Days Discharge Instructions: Wash your hands with soap and water. Remove old dressing, discard into plastic bag and place into trash. Cleanse the wound with Normal Saline prior to applying a clean dressing using gauze sponges, not tissues or cotton balls. Do not scrub or use excessive force. Pat dry using gauze sponges, not tissue or cotton balls. Primary Dressing: IODOFLEX 0.9% Cadexomer Iodine Pad (Generic) 3 x Per Week/30 Days Discharge Instructions: Apply Iodoflex to wound bed only as directed. Secondary Dressing: ABD Pad 5x9 (in/in) (Generic) 3 x Per Week/30 Days Discharge Instructions: Cover with ABD pad Secondary Dressing: Kerlix 4.5 x 4.1 (in/yd) (Generic) 3 x Per Week/30 Days Discharge Instructions: Apply Kerlix 4.5 x 4.1 (in/yd) as instructed Secured With: Tubigrip Size E, 3.5x10 (in/yds) (Generic) 3 x Per Week/30 Days Discharge Instructions: Apply 3 Tubigrip E 3-finger-widths below knee to base of toes to secure dressing and/or for swelling. 1. Would recommend currently that we continue with the Iodosorb which I think has been beneficial for him we will use Iodoflex here in the clinic. 2. I am also can recommend that we have the patient continue with an ABD pad and roll gauze to secure followed by Tubigrip. 3. I am also can recommend he needs to elevate his legs much as possible. 4. The patient does need to be taking the Levaquin. If he can get this in his system and we get the infection under control then the goal will be to switch him over to just  using the compression wrap that we apply here in the clinic and he will have to worry about dressing changes at all at home which would be the ideal situation his friend noted this as well and states he is can work on getting the antibiotic in him and taking care of. We will see patient back for reevaluation in 1 week here in the clinic. If anything worsens or changes patient will contact our office for additional recommendations. Electronic Signature(s) Signed: 06/24/2020 11:24:05 AM By: Worthy Keeler PA-C Entered By: Worthy Keeler on 06/24/2020 11:24:05 Schroeter, [name] ([id number]) -------------------------------------------------------------------------------- SuperBill Details [name] Date of Service: 06/24/2020 Medical Record Number: [id number] Patient Account Number: [id number] Date of Birth/Sex: 21-Oct-1934 (86 y.o. M) Treating RN: Carlene Coria Primary Care Provider: Nobie Putnam Other Clinician: Jeanine Luz Referring Provider: Nobie Putnam Treating Provider/Extender: Skipper Cliche in Treatment: 3 Diagnosis Coding ICD-10 Codes Code Description L89.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, unstageable R29.6 Repeated falls Z79.01 Long  term (current) use of anticoagulants Facility Procedures CPT4 Code: [id number] Description: [phone] - WOUND CARE VISIT-LEV 3 EST PT Modifier: Quantity: 1 Physician Procedures CPT4 Code: [id number] Description: 88677 - WC PHYS LEVEL 4 - EST PT Modifier: Quantity: 1 CPT4 Code: Description: ICD-10 Diagnosis Description L89.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, unstageable R29.6 Repeated falls Z79.01 Long term (current) use of anticoagulants Modifier: Quantity: Electronic Signature(s) Signed: 06/24/2020 11:24:25 AM By: Worthy Keeler PA-C Entered By: Worthy Keeler on 06/24/2020 11:24:25

## 2020-06-24 NOTE — Chronic Care Management (AMB) (Signed)
Chronic Care Management   CCM RN Visit Note  06/24/2020 Name: DNAIEL VOLLER MRN: [id number] DOB: [birth year]  Subjective: KOLTER REAVER is a 86 y.o. year old male who is a primary care patient of [name], DO. The care management team was consulted for assistance with disease management and care coordination needs.    Collaboration with wound clinic nurse [name] for follow up visit in response to provider referral for case management and/or care coordination services. Concerns about the patients mental capacity and ability to be safe in home environment   Consent to Services:  follow up call from staff at [facility]  Patient agreed to services and verbal consent obtained.   Assessment: Review of patient past medical history, allergies, medications, health status, including review of consultants reports, laboratory and other test data, was performed as part of comprehensive evaluation and provision of chronic care management services.   SDOH (Social Determinants of Health) assessments and interventions performed:    CCM Care Plan  No Known Allergies  Outpatient Encounter Medications as of 06/24/2020  Medication Sig  . aspirin EC 81 MG tablet Take 1 tablet (81 mg total) by mouth daily.  Marland Kitchen atorvastatin (LIPITOR) 40 MG tablet Take 1 tablet (40 mg total) by mouth at bedtime.  . clopidogrel (PLAVIX) 75 MG tablet Take 1 tablet (75 mg total) by mouth daily.  Marland Kitchen lisinopril (ZESTRIL) 20 MG tablet Take 1 tablet (20 mg total) by mouth daily.  Marland Kitchen terazosin (HYTRIN) 5 MG capsule Take 1 capsule (5 mg total) by mouth at bedtime.   No facility-administered encounter medications on file as of 06/24/2020.    Patient Active Problem List   Diagnosis Date Noted  . Imbalance 02/01/2018  . Humeral head fracture, left, closed, initial encounter 09/22/2017  . History of cerebrovascular accident (CVA) with residual deficit 09/09/2017  . CKD (chronic kidney disease), stage III  ([place]) 09/01/2017  . Elevated PSA, between 10 and less than 20 ng/ml 07/22/2016  . Bilateral lower extremity edema 07/22/2016  . Abnormal glucose 07/15/2016  . BPH with obstruction/lower urinary tract symptoms 04/10/2016  . Benign hypertension with CKD (chronic kidney disease) stage III ([place]) 04/10/2016  . Memory loss 04/10/2016  . Gastric outlet obstruction 12/02/2014  . Generalized abdominal pain   . Benign fibroma of prostate 01/02/2011  . HLD (hyperlipidemia) 01/02/2011  . Obesity (BMI 30.0-34.9) 01/02/2011    Conditions to be addressed/monitored:HTN, HLD, CKD Stage 3 and pressure ulcer to right heel  Goals Addressed              This Visit's Progress   .  RNCM: PT-"Something is wrong with my foot" (pt-stated)        CARE PLAN ENTRY (see longtitudinal plan of care for additional care plan information)  Current Barriers:  . Chronic Disease Management support, education, and care coordination needs related to HTN, HLD, and CKD Stage 3 . Pressure ulcer to right heel- working with wound clinic . Memory issues . Limited social support . Safety concerns . Knowledge deficit related to area on his foot that is not healing  Clinical Goal(s) related to HTN, HLD, and CKD Stage 3 :  Over the next 120 days, patient will:  . Work with the care management team to address educational, disease management, and care coordination needs  . Begin or continue self health monitoring activities as directed today Measure and record blood pressure 3 times per week and adhere to a heart healthy diet .  Call provider office for new or worsened signs and symptoms Blood pressure findings outside established parameters, New or worsened symptom related to HLD/CKD, and new issues related to non-healing area on his foot . Call care management team with questions or concerns . Verbalize basic understanding of patient centered plan of care established today  Interventions related to HTN, HLD, and CKD Stage 3  :  . Evaluation of current treatment plans and patient's adherence to plan as established by provider.  The patient is adherent to the plan of care. 06-24-2020: Incoming call from the wound clinic nurse [facility]. Had received an email about concerns that needed to be discussed from [name] from [facility].  The email was to [name] and [facility]. Had left a message with when calling the wound clinic and did receive a call back. The patient has been coming to the wound clinic for an stage 4/unstageable wound to right heel.  The patient has been coming 3 weeks. The wound clinic has significant concerns because the last 2 weeks when the patient had an appointment he went to the pcp office first before coming to the wound clinic because he was confused.  The patient also had a script for [place]. The medications was picked up but the patient denied taking it, states he only had an empty bottle. The wound clinic did send the order again and the script was picked up. The patient told the staff at the wound clinic that he took 7 pills at one time. [name] expressed they are very concerned as the patients mental capacity appears to be impaired. The patient lost his keys for 5 days.  His wife comes with him to appointments but has confusion as well. A friend came with the patient today; however does not come with him every time. The friend was going to get a pill container and fix the patients medications for him.  The staff are concerned for the safety of the patient and reached out to the [facility] for assistance. Will collaborate with the CCM team and pcp on recommendations. The wound clinic has the [facility] information to call for updates accordingly.  . Assessed patient understanding of disease states.  The patient verbalized understanding of his chronic conditions. The patient states that he is doing well. Taking his blood pressure every other day and it is good. Does state that he doesn't have as much  energy and needs more energy. Discussed ways to pace activity and take rest breaks when needed.  . Assessed patient's education and care coordination needs-the patient has an appointment on 3-18 with podiatrist to evaluate his foot.  10-19-2019: The patient states he is doing well and has no further issues with his foot. The patient denies any concerns related to his foot or other health problems.  . Provided disease specific education to patient- education and support on a heart healthy diet.  The patient states he eats well. Denies any issues with current diet.  10-19-2019: The patient admits that he could do better at watching his sodium and fats. Denies issues with elevated blood pressure takes every other day and records at home. Reminded the patient to watch his sodium content.  [name] with appropriate clinical care team members regarding patient needs- the patient is currently working with [name]. Knows resources are available  Patient Self Care Activities related to HTN, HLD, and CKD Stage 3 :  . Patient is unable to independently self-manage chronic health conditions  Please see  past updates related to this goal by clicking on the "Past Updates" button in the selected goal         Plan:Telephone follow up appointment with care management team member scheduled for:  06-27-2020  [name] RN, MSN, [place] [employer] Mobile: [phone]

## 2020-06-24 NOTE — Patient Instructions (Signed)
Visit Information  PATIENT GOALS: Goals Addressed              This Visit's Progress   .  RNCM: PT-"Something is wrong with my foot" (pt-stated)        CARE PLAN ENTRY (see longtitudinal plan of care for additional care plan information)  Current Barriers:  . Chronic Disease Management support, education, and care coordination needs related to HTN, HLD, and CKD Stage 3 . Pressure ulcer to right heel- working with wound clinic . Memory issues . Limited social support . Safety concerns . Knowledge deficit related to area on his foot that is not healing  Clinical Goal(s) related to HTN, HLD, and CKD Stage 3 :  Over the next 120 days, patient will:  . Work with the care management team to address educational, disease management, and care coordination needs  . Begin or continue self health monitoring activities as directed today Measure and record blood pressure 3 times per week and adhere to a heart healthy diet . Call provider office for new or worsened signs and symptoms Blood pressure findings outside established parameters, New or worsened symptom related to HLD/CKD, and new issues related to non-healing area on his foot . Call care management team with questions or concerns . Verbalize basic understanding of patient centered plan of care established today  Interventions related to HTN, HLD, and CKD Stage 3 :  . Evaluation of current treatment plans and patient's adherence to plan as established by provider.  The patient is adherent to the plan of care. 06-24-2020: Incoming call from the wound clinic nurse [name]. Had received an email about concerns that needed to be discussed from [name] from [facility].  The email was to [name] and [name]. Had left a message with when calling the wound clinic and did receive a call back. The patient has been coming to the wound clinic for an stage 4/unstageable wound to right heel.  The patient has been coming 3 weeks. The  wound clinic has significant concerns because the last 2 weeks when the patient had an appointment he went to the pcp office first before coming to the wound clinic because he was confused.  The patient also had a script for [place]. The medications was picked up but the patient denied taking it, states he only had an empty bottle. The wound clinic did send the order again and the script was picked up. The patient told the staff at the wound clinic that he took 7 pills at one time. [name] expressed they are very concerned as the patients mental capacity appears to be impaired. The patient lost his keys for 5 days.  His wife comes with him to appointments but has confusion as well. A friend came with the patient today; however does not come with him every time. The friend was going to get a pill container and fix the patients medications for him.  The staff are concerned for the safety of the patient and reached out to the [name] for assistance. Will collaborate with the CCM team and pcp on recommendations. The wound clinic has the [name] information to call for updates accordingly.  . Assessed patient understanding of disease states.  The patient verbalized understanding of his chronic conditions. The patient states that he is doing well. Taking his blood pressure every other day and it is good. Does state that he doesn't have as much energy and needs more energy. Discussed ways to pace activity  and take rest breaks when needed.  . Assessed patient's education and care coordination needs-the patient has an appointment on 3-18 with podiatrist to evaluate his foot.  10-19-2019: The patient states he is doing well and has no further issues with his foot. The patient denies any concerns related to his foot or other health problems.  . Provided disease specific education to patient- education and support on a heart healthy diet.  The patient states he eats well. Denies any issues with current diet.  10-19-2019: The patient  admits that he could do better at watching his sodium and fats. Denies issues with elevated blood pressure takes every other day and records at home. Reminded the patient to watch his sodium content.  [name] with appropriate clinical care team members regarding patient needs- the patient is currently working with [name]. Knows resources are available  Patient Self Care Activities related to HTN, HLD, and CKD Stage 3 :  . Patient is unable to independently self-manage chronic health conditions  Please see past updates related to this goal by clicking on the "Past Updates" button in the selected goal         The patient verbalized understanding of instructions, educational materials, and care plan provided today and declined offer to receive copy of patient instructions, educational materials, and care plan.   Telephone follow up appointment with care management team member scheduled for: 06-27-2020  [name] RN, MSN, [place] Mobile: [phone]

## 2020-06-24 NOTE — Telephone Encounter (Signed)
  Chronic Care Management   Outreach Note  06/24/2020 Name: [name] MRN: [id number] DOB: [birth year]  Referred by: [name], DO Reason for referral : No chief complaint on file.  Collaborate with CCM Nurse Case Manager.  Was unable to reach patient via telephone and [place] today and unable to leave a message as no voicemail is setup.  Follow Up Plan: Will collaborate with Care Guide to outreach to schedule follow up with me  Harlow Asa, PharmD, [place] Management [phone]

## 2020-06-25 ENCOUNTER — Other Ambulatory Visit: Payer: Self-pay

## 2020-06-25 ENCOUNTER — Telehealth: Payer: Self-pay | Admitting: Family Medicine

## 2020-06-25 ENCOUNTER — Encounter: Payer: Self-pay | Admitting: Family Medicine

## 2020-06-25 ENCOUNTER — Ambulatory Visit: Payer: Medicare Other | Admitting: Licensed Clinical Social Worker

## 2020-06-25 ENCOUNTER — Ambulatory Visit (INDEPENDENT_AMBULATORY_CARE_PROVIDER_SITE_OTHER): Payer: Medicare Other | Admitting: Family Medicine

## 2020-06-25 VITALS — BP 140/84 | HR 73 | Temp 98.0°F | Resp 18 | Ht 70.0 in | Wt 222.8 lb

## 2020-06-25 DIAGNOSIS — I69319 Unspecified symptoms and signs involving cognitive functions following cerebral infarction: Secondary | ICD-10-CM

## 2020-06-25 DIAGNOSIS — N138 Other obstructive and reflux uropathy: Secondary | ICD-10-CM

## 2020-06-25 DIAGNOSIS — N183 Chronic kidney disease, stage 3 unspecified: Secondary | ICD-10-CM

## 2020-06-25 DIAGNOSIS — I129 Hypertensive chronic kidney disease with stage 1 through stage 4 chronic kidney disease, or unspecified chronic kidney disease: Secondary | ICD-10-CM

## 2020-06-25 DIAGNOSIS — E782 Mixed hyperlipidemia: Secondary | ICD-10-CM

## 2020-06-25 DIAGNOSIS — I6930 Unspecified sequelae of cerebral infarction: Secondary | ICD-10-CM

## 2020-06-25 DIAGNOSIS — I10 Essential (primary) hypertension: Secondary | ICD-10-CM

## 2020-06-25 DIAGNOSIS — N401 Enlarged prostate with lower urinary tract symptoms: Secondary | ICD-10-CM | POA: Diagnosis not present

## 2020-06-25 MED ORDER — ATORVASTATIN CALCIUM 40 MG PO TABS: 40.0000 mg | ORAL_TABLET | Freq: Every day | ORAL | 3 refills | Status: DC

## 2020-06-25 NOTE — Chronic Care Management (AMB) (Signed)
Chronic Care Management    Clinical Social Work Note  06/25/2020 Name: [name] MRN: [id number] DOB: 1935-03-12  [name] is a 86 y.o. year old male who is a primary care patient of Jonathan Hauser, DO. The CCM team was consulted to assist the patient with chronic disease management and/or care coordination needs related to: Level of Care Concerns.   Engaged with patient by telephone for follow up visit in response to provider referral for social work chronic care management and care coordination services.   Consent to Services:  The patient was given the following information about Chronic Care Management services today, agreed to services, and gave verbal consent: 1. CCM service includes personalized support from designated clinical staff supervised by the primary care provider, including individualized plan of care and coordination with other care providers 2. 24/7 contact phone numbers for assistance for urgent and routine care needs. 3. Service will only be billed when office clinical staff spend 20 minutes or more in a month to coordinate care. 4. Only one practitioner may furnish and bill the service in a calendar month. 5.The patient may stop CCM services at any time (effective at the end of the month) by phone call to the office staff. 6. The patient will be responsible for cost sharing (co-pay) of up to 20% of the service fee (after annual deductible is met). Patient agreed to services and consent obtained.  Patient agreed to services and consent obtained.   Assessment: Review of patient past medical history, allergies, medications, and health status, including review of relevant consultants reports was performed today as part of a comprehensive evaluation and provision of chronic care management and care coordination services.     SDOH (Social Determinants of Health) assessments and interventions performed:    Advanced Directives Status: See Care Plan for related  entries.  CCM Care Plan  No Known Allergies  Outpatient Encounter Medications as of 06/25/2020  Medication Sig   aspirin EC 81 MG tablet Take 1 tablet (81 mg total) by mouth in the morning. (blood thinner)   atorvastatin (LIPITOR) 40 MG tablet Take 1 tablet (40 mg total) by mouth at bedtime. (Cholesterol)   clopidogrel (PLAVIX) 75 MG tablet Take 1 tablet (75 mg total) by mouth in the morning. (blood thinner)   levofloxacin (LEVAQUIN) 500 MG tablet Take 500 mg by mouth daily. (Patient not taking: Reported on 06/25/2020)   lisinopril (ZESTRIL) 20 MG tablet Take 1 tablet (20 mg total) by mouth in the morning. (Blood pressure)   terazosin (HYTRIN) 5 MG capsule Take 1 capsule (5 mg total) by mouth at bedtime. (blood pressure, prostate)   No facility-administered encounter medications on file as of 06/25/2020.    Patient Active Problem List   Diagnosis Date Noted   Imbalance 02/01/2018   Humeral head fracture, left, closed, initial encounter 09/22/2017   History of cerebrovascular accident (CVA) with residual deficit 09/09/2017   CKD (chronic kidney disease), stage III ([place]) 09/01/2017   Elevated PSA, between 10 and less than 20 ng/ml 07/22/2016   Bilateral lower extremity edema 07/22/2016   Abnormal glucose 07/15/2016   BPH with obstruction/lower urinary tract symptoms 04/10/2016   Benign hypertension with CKD (chronic kidney disease) stage III ([place]) 04/10/2016   Memory loss 04/10/2016   Gastric outlet obstruction 12/02/2014   Generalized abdominal pain    Benign fibroma of prostate 01/02/2011   HLD (hyperlipidemia) 01/02/2011   Obesity (BMI 30.0-34.9) 01/02/2011    Care Plan : General Social  Work (Adult)  Updates made by Jonathan Cutter, [name] since 06/25/2020 12:00 AM    Problem: Quality of Life (General Plan of Care)     Long-Range Goal: Quality of Life Maintained   Start Date: 06/19/2020  Priority: Medium  Note:   Timeframe:  Long-Range Goal Priority:   Medium Start Date:   06/19/20                     Expected End Date: 08/19/20                    Follow Up Date- 07/23/20  Current [place]:   Limited social support  ADL IADL limitations  Social Isolation  Limited access to caregiver  Inability to perform ADL's independently  Inability to perform IADL's independently  Lacks knowledge of community resource: personal care services  Clinical Social Work Clinical Goal(s):   Over the next 120 days, patient will work with SW to address concerns related to lack of care within the home  Interventions:  Patient and family interviewed and appropriate assessments performed  Update 06/25/20-[name] and his wife are close church friends to patient for over 30 years and have been assisting him with his care. They live in [facility] [place] [facility] [facility] but have been staying at patient's house since this weekend. Jonathan's number is [phone]. [name] took patient to PCP office visit today on 06/25/20 and picked up his medications. Jonathan's wife is going to order patient some medical supplies which include a pill box for both morning and evenings. [name] was unaware of patient's urology appointment tomorrow and was appreciative of CCM [name]'s reminder. [name] and spouse will transport him there today on 06/26/20 at 1:30 pm. CCM [name] advised [name] to return CCM Pharmacist's call and he was agreeable to this plan.   CCM [name] was unable to reach patient or spouse but successfully spoke with his sister on 06/19/20. Sister reports that patient is doing better and that they go to church together each Sunday. She reports that she cares a lot for her brother and is there for him when needed but she is 84 years old and has her own health concerns.    Provided patient and family with information about available personal care services within the area such as [name] program. Patient and family feel that he will not be eligible for Medicaid. Patient is now on wait list for  [name]  Discussed plans with patient for ongoing care management follow up and provided patient with direct contact information for care management team  Advised patient to considering hiring a private pay caregiver as Medicare does not cover these services  Assisted patient/caregiver with obtaining information about health plan benefits  Provided education and assistance to client regarding Advanced Directives.  Provided education to patient/caregiver regarding level of care options.  Provided education to patient/caregiver about Hospice and/or Palliative Care services  [name] will place patient on wait list for [name] program during next scheduled CCM appointment---Application completed and patient was successfully added on the wait list for [name] program on 08/08/19. Patient was provided this update. Patient was provided Home Care Providers contact information in the case that he wishes to inquire where he is at on the wait list. Sister was provided this information as well on 06/19/20  Patient Self Care Activities:   Attends all scheduled provider appointments  Calls provider office for new concerns or questions  Lacks social connections  Unable to perform ADLs independently  Unable to  perform IADLs independently  Please see past updates related to this goal by clicking on the "Past Updates" button in the selected goal       Follow Up Plan: SW will follow up with patient by phone over the next 30 days      Eula Fried, [place], MSW, [email or web address] Phone: [phone]

## 2020-06-25 NOTE — Telephone Encounter (Signed)
When pt got home, they forgot to let the dr know pt needs the atorvastatin (LIPITOR) 40 MG tablet  It appears it was order, but not sure the Rx when through.  It doesn't say the pharmacy received. Please advise if this was a written or e-scriibed .

## 2020-06-25 NOTE — Addendum Note (Signed)
Addended by: Wilson Singer on: 06/25/2020 10:47 AM   Modules accepted: Orders

## 2020-06-25 NOTE — Patient Instructions (Addendum)
Thank you for coming to the office today.  Medication list: updated with AM / PM and reason for taking.  Finish Levaquin (Levofloxacin) 500mg  once daily (antibiotic) - finish last 9 days for foot, from wound care.  Chronic Care Management Team:  Pharmacist - Harlow Asa - we can re-schedule her phone call, and she can help with Pill Pack.  Nurse Case Manager - [name] - can help with arranging home health and checking on you  Social Workers - Can help with arranging home health / future in home care aide ----------------------------------------  New urology apt tomorrow  Will place referral for [facility] to assist with medications and check up, only few times a week and short time period, can get a home aide for longer duration in future.   Please schedule a Follow-up Appointment to: Return in about 3 months (around 09/25/2020) for 3 month follow-up HTN Memory, Meds.  If you have any other questions or concerns, please feel free to call the office or send a message through [place]. You may also schedule an earlier appointment if necessary.  Additionally, you may be receiving a survey about your experience at our office within a few days to 1 week by e-mail or mail. We value your feedback.  Nobie Putnam, DO [place]

## 2020-06-25 NOTE — Patient Instructions (Signed)
Licensed Clinical Social Worker Visit Information  Goals we discussed today:  Goals Addressed              This Visit's Progress   .  "I need help with caregiving in the home" (pt-stated)        Timeframe:  Long-Range Goal Priority:  Medium Start Date:   06/19/20                     Expected End Date: 08/19/20                    Follow Up Date- 07/23/20  Current [place]:  . Limited social support . ADL IADL limitations . Social Isolation . Limited access to caregiver . Inability to perform ADL's independently . Inability to perform IADL's independently . Lacks knowledge of community resource: personal care services  Clinical Social Work Clinical Goal(s):  Marland Kitchen Over the next 120 days, patient will work with SW to address concerns related to lack of care within the home  Interventions: . Patient and family interviewed and appropriate assessments performed . Update 06/25/20-[name] and his wife are close church friends to patient for over 30 years and have been assisting him with his care. They live in [facility] but have been staying at patient's house since this weekend. [name] number is [phone]. [name] took patient to PCP office visit today on 06/25/20 and picked up his medications. [name] wife is going to order patient some medical supplies which include a pill box for both morning and evenings. [name] was unaware of patient's urology appointment tomorrow and was appreciative of CCM [name]'s reminder. [name] and spouse will transport him there today on 06/26/20 at 1:30 pm. CCM [name] advised [name] to return CCM Pharmacist's call and he was agreeable to this plan.  Marland Kitchen CCM [name] was unable to reach patient or spouse but successfully spoke with his sister on 06/19/20. Sister reports that patient is doing better and that they go to church together each Sunday. She reports that she cares a lot for her brother and is there for him when needed but she is 84 years old and has her own  health concerns.   . Provided patient and family with information about available personal care services within the area such as C.H.O.R.E program. Patient and family feel that he will not be eligible for Medicaid. Patient is now on wait list for C.H.O.R.E . Discussed plans with patient for ongoing care management follow up and provided patient with direct contact information for care management team . Advised patient to considering hiring a private pay caregiver as Medicare does not cover these services . Assisted patient/caregiver with obtaining information about health plan benefits . Provided education and assistance to client regarding Advanced Directives. . Provided education to patient/caregiver regarding level of care options. . Provided education to patient/caregiver about Hospice and/or Palliative Care services . [name] will place patient on wait list for C.H.O.R.E program during next scheduled CCM appointment---Application completed and patient was successfully added on the wait list for C.H.O.R.E program on 08/08/19. Patient was provided this update. Patient was provided Home Care Providers contact information in the case that he wishes to inquire where he is at on the wait list. Sister was provided this information as well on 06/19/20  Patient Self Care Activities:  . Attends all scheduled provider appointments . Calls provider office for new concerns or questions . Lacks social connections . Unable to perform ADLs independently .  Unable to perform IADLs independently  Please see past updates related to this goal by clicking on the "Past Updates" button in the selected goal         Eula Fried, [place], MSW, [email or web address] Phone: [phone]

## 2020-06-25 NOTE — Progress Notes (Signed)
06/26/2020 2:31 PM   [name] May 19, 1934 [id number]  Referring provider: [name], DO [address] [place],  [place] [address]  Chief Complaint  Patient presents with  . Benign Prostatic Hypertrophy   Urological history: 1. Elevated PSA - PSA Trend PSA, Total 0 - 4 ng/mL 2.34   2012   Component     Latest Ref Rng & Units 07/20/2016  PSA, Total     <=4.0 ng/mL 11.8 (H)   Component     Latest Ref Rng & Units 10/01/2016 04/09/2017 10/04/2017 11/08/2018  Prostate Specific Ag, Serum     0.0 - 4.0 ng/mL 10.6 (H) 11.1 (H) 17.9 (H) 17.4 (H)   Component     Latest Ref Rng & Units 06/13/2020  PSA     < OR = 4.0 ng/mL 24.34 (H)   Advised to undergo a prostate MRI in 10/2018 but patient refused  2. BPH with LU TS - I PSS 21/5 - PVR 0 mL - managed with terazosin 4 mg daily   HPI: [name] is a 86 y.o. male who is referred by Dr. Parks Ranger for elevated PSA and urinary frequency.    His main urinary complaint is nocturia x5-6.  He states he does not have daytime frequency.  His PVR is 0 mL.  Patient denies any modifying or aggravating factors.  Patient denies any gross hematuria, dysuria or suprapubic/flank pain.  Patient denies any fevers, chills, nausea or vomiting.   He states he does drink fluids and in the evening and he is currently having pedal edema due to a stasis ulcer in his right ankle.    I had seen him in July of 2020 for a PSA of 17.4 and at that visit, his prostate was also irregular on exam.  I recommended that he undergo a prostate MRI at that time to be evaluated for high-grade prostate cancer, but he refused.      IPSS    Row Name 06/26/20 1300         International Prostate Symptom Score   How often have you had the sensation of not emptying your bladder? Less than half the time     How often have you had to urinate less than every two hours? About half the time     How often have you found you stopped and started again several times  when you urinated? Less than 1 in 5 times     How often have you found it difficult to postpone urination? Almost always     How often have you had a weak urinary stream? More than half the time     How often have you had to strain to start urination? About half the time     How many times did you typically get up at night to urinate? 3 Times     Total IPSS Score 21           Quality of Life due to urinary symptoms   If you were to spend the rest of your life with your urinary condition just the way it is now how would you feel about that? Unhappy            Score:  1-7 Mild 8-19 Moderate 20-35 Severe    PMH: Past Medical History:  Diagnosis Date  . Bowel obstruction ([place])   . BPH (benign prostatic hypertrophy)   . Gastric outlet obstruction   . High cholesterol   .  Hyperchloremia   . Hypertension   . Pancreatitis     Surgical History: Past Surgical History:  Procedure Laterality Date  . ESOPHAGOGASTRODUODENOSCOPY N/A 12/03/2014   Procedure: ESOPHAGOGASTRODUODENOSCOPY (EGD);  Surgeon: Hulen Luster, MD;  Location: [facility] ENDOSCOPY;  Service: Endoscopy;  Laterality: N/A;    Home Medications:  Allergies as of 06/26/2020   No Known Allergies     Medication List       Accurate as of June 26, 2020  2:31 PM. If you have any questions, ask your nurse or doctor.        aspirin EC 81 MG tablet Take 1 tablet (81 mg total) by mouth in the morning. (blood thinner)   atorvastatin 40 MG tablet Commonly known as: LIPITOR Take 1 tablet (40 mg total) by mouth at bedtime. (Cholesterol)   clopidogrel 75 MG tablet Commonly known as: PLAVIX Take 1 tablet (75 mg total) by mouth in the morning. (blood thinner)   levofloxacin 500 MG tablet Commonly known as: LEVAQUIN Take 500 mg by mouth daily.   lisinopril 20 MG tablet Commonly known as: ZESTRIL Take 1 tablet (20 mg total) by mouth in the morning. (Blood pressure)   terazosin 5 MG capsule Commonly known as: HYTRIN Take 1  capsule (5 mg total) by mouth at bedtime. (blood pressure, prostate)       Allergies: No Known Allergies  Family History: Family History  Problem Relation Age of Onset  . Heart disease Mother   . Heart disease Father   . Heart disease Brother     Social History:  reports that he quit smoking about 37 years ago. He has quit using smokeless tobacco. He reports that he does not drink alcohol and does not use drugs.  For pertinent review of systems please refer to history of present illness  Physical Exam: BP (!) 160/74   Pulse 73   Ht 5\' 10"  (1.778 m)   Wt 224 lb (101.6 kg)   BMI 32.14 kg/m   Constitutional:  Well nourished. Alert and oriented, No acute distress. HEENT: [place] AT, mask in place.  Trachea midline Cardiovascular: No clubbing, cyanosis, or edema. Respiratory: Normal respiratory effort, no increased work of breathing. Neurologic: Grossly intact, no focal deficits, moving all 4 extremities. Psychiatric: Normal mood and affect.   Laboratory Data: Lab Results  Component Value Date   WBC 7.7 06/13/2020   HGB 12.1 (L) 06/13/2020   HCT 35.0 (L) 06/13/2020   MCV 93.8 06/13/2020   PLT 194 06/13/2020    Lab Results  Component Value Date   CREATININE 1.25 (H) 06/13/2020    Lab Results  Component Value Date   PSA 24.34 (H) 06/13/2020    Lab Results  Component Value Date   HGBA1C 5.8 (H) 06/13/2020  I have reviewed the labs.  Pertinent Imaging Results for [name], [name] (MRN [id number]) as of 06/26/2020 13:54  Ref. Range 06/26/2020 13:49  Scan Result Unknown 30mL    Assessment & Plan:    1. Nocturia -I explained to the patient that he has a high probability of having sleep apnea due to his frequent nighttime urinations, lack of daytime urinations and having no bladder residual of urine after voiding.  I recommended that he speak with Dr. Parks Ranger regarding undergoing a sleep study to be evaluated for sleep apnea.  If he has sleep apnea he will need to sleep  with a CPAP machine in order to decrease his nocturia.  2.  Elevated PSA/Abnormal exam -I explained to  the patient that his further increase in PSA and his abnormal prostate exam at his visit with me in July were concerning for prostate cancer.  As his PVR is 0, it is unlikely that his prostate is causing obstructive symptoms.  If he was found to have prostate cancer at this time, I do not believe treating the prostate cancer with improve his nocturia.  He would like further exploration into the etiology of his elevated PSA at this time, so I have elected to schedule him for a prostate MRI as he is elderly and on Plavix for further evaluation.  3. BPH with LU TS - mr of prostate pending    Return for MRI results.     [name], PA-C  [facility] [address] [place], [place] [address] [phone]

## 2020-06-25 NOTE — Progress Notes (Signed)
Subjective:    Patient ID: [name], male    DOB: [birth year], 86 y.o.   MRN: [id number]  [name] is a 86 y.o. male presenting on 06/25/2020 for Hypertension (Medication recouncilations. The pt comes in today with a church member , [name] who is concern about the patient health. He state the patient is not taking his medications as directed and concern he might need home health nurse to come assisted him. Concerns for possible cognitive impaired.  )   HPI   Friend from church, [name] is here, he is retired and able to help often. He will try to help set up medicines.  He plans to get pill boxes to help.  CHRONIC HTNw/ CKD III Issue with med adherence, not taking properly needs help at home Current Meds -Lisinopril 20mg  daily, Terazosin 5mg  daily Reports good compliance, took meds today. Tolerating well, w/o complaints. Lifestyle: - Diet:balanced diet - Exercise:walking Denies CP, dyspnea, HA, edema, dizziness / lightheadedness  Hyperlipidemia History of CVA Cognitive Decline following stroke. Recently re ordered. Atorvastatin 40mg , ASA 81 and Plavix 75mg  was on DAPT for stroke secondary prevention. Needs help adhering to medicines.  BPH / Elevated PSA Previously elevated PSA followed by Urology On Terazosin 5mg  for LUTS - had lab with elevated PSA 24 he was referred to urology apt is tomorrow 3/9   Depression screen [facility] 2/9 06/26/2019 10/18/2018 03/02/2018  Decreased Interest 0 0 0  Down, Depressed, Hopeless 0 0 0  PHQ - 2 Score 0 0 0    Social History   Tobacco Use  . Smoking status: Former Smoker    Quit date: 1985    Years since quitting: 37.2  . Smokeless tobacco: Former Network engineer  . Vaping Use: Never used  Substance Use Topics  . Alcohol use: No  . Drug use: No    Review of Systems Per HPI unless specifically indicated above     Objective:    BP 140/84 (BP Location: Left Arm, Cuff Size: Normal)   Pulse 73   Temp 98 F  (36.7 C) (Temporal)   Resp 18   Ht 5\' 10"  (1.778 m)   Wt 222 lb 12.8 oz (101.1 kg)   SpO2 97%   BMI 31.97 kg/m   Wt Readings from Last 3 Encounters:  06/25/20 222 lb 12.8 oz (101.1 kg)  06/13/20 226 lb 6.4 oz (102.7 kg)  06/03/20 205 lb 14.6 oz (93.4 kg)    Physical Exam Vitals and nursing note reviewed.  Constitutional:      General: He is not in acute distress.    Appearance: He is well-developed and well-nourished. He is not diaphoretic.     Comments: Well-appearing, comfortable, cooperative  HENT:     Head: Normocephalic and atraumatic.     Mouth/Throat:     Mouth: Oropharynx is clear and moist.  Eyes:     General:        Right eye: No discharge.        Left eye: No discharge.     Conjunctiva/sclera: Conjunctivae normal.  Neck:     Thyroid: No thyromegaly.  Cardiovascular:     Rate and Rhythm: Normal rate and regular rhythm.     Pulses: Intact distal pulses.     Heart sounds: Normal heart sounds. No murmur heard.   Pulmonary:     Effort: Pulmonary effort is normal. No respiratory distress.     Breath sounds: Normal breath sounds. No wheezing  or rales.  Musculoskeletal:        General: No edema. Normal range of motion.     Cervical back: Normal range of motion and neck supple.  Lymphadenopathy:     Cervical: No cervical adenopathy.  Skin:    General: Skin is warm and dry.     Findings: No erythema or rash.  Neurological:     Mental Status: He is alert.  Psychiatric:        Mood and Affect: Mood and affect normal.        Behavior: Behavior normal.     Comments: Well groomed, good eye contact, normal speech and thoughts        Results for orders placed or performed in visit on 06/13/20  Hemoglobin A1c  Result Value Ref Range   Hgb A1c MFr Bld 5.8 (H) <5.7 % of total Hgb   Mean Plasma Glucose 120 mg/dL   eAG (mmol/L) 6.6 mmol/L  CBC with Differential/Platelet  Result Value Ref Range   WBC 7.7 3.8 - 10.8 Thousand/uL   RBC 3.73 (L) 4.20 - 5.80  Million/uL   Hemoglobin 12.1 (L) 13.2 - 17.1 g/dL   HCT 35.0 (L) 38.5 - 50.0 %   MCV 93.8 80.0 - 100.0 fL   MCH 32.4 27.0 - 33.0 pg   MCHC 34.6 32.0 - 36.0 g/dL   RDW 12.4 11.0 - 15.0 %   Platelets 194 140 - 400 Thousand/uL   MPV 9.0 7.5 - 12.5 fL   Neutro Abs 5,028 1,500 - 7,800 cells/uL   Lymphs Abs 1,725 850 - 3,900 cells/uL   Absolute Monocytes 631 200 - 950 cells/uL   Eosinophils Absolute 277 15 - 500 cells/uL   Basophils Absolute 39 0 - 200 cells/uL   Neutrophils Relative % 65.3 %   Total Lymphocyte 22.4 %   Monocytes Relative 8.2 %   Eosinophils Relative 3.6 %   Basophils Relative 0.5 %  COMPLETE METABOLIC PANEL WITH GFR  Result Value Ref Range   Glucose, Bld 107 (H) 65 - 99 mg/dL   BUN 24 7 - 25 mg/dL   Creat 1.25 (H) 0.70 - 1.11 mg/dL   GFR, Est Non African American 53 (L) > OR = 60 mL/min/1.6m2   GFR, Est African American 61 > OR = 60 mL/min/1.79m2   BUN/Creatinine Ratio 19 6 - 22 (calc)   Sodium 142 135 - 146 mmol/L   Potassium 4.3 3.5 - 5.3 mmol/L   Chloride 104 98 - 110 mmol/L   CO2 33 (H) 20 - 32 mmol/L   Calcium 8.1 (L) 8.6 - 10.3 mg/dL   Total Protein 6.5 6.1 - 8.1 g/dL   Albumin 3.2 (L) 3.6 - 5.1 g/dL   Globulin 3.3 1.9 - 3.7 g/dL (calc)   AG Ratio 1.0 1.0 - 2.5 (calc)   Total Bilirubin 0.6 0.2 - 1.2 mg/dL   Alkaline phosphatase (APISO) 73 35 - 144 U/L   AST 17 10 - 35 U/L   ALT 14 9 - 46 U/L  Lipid panel  Result Value Ref Range   Cholesterol 163 <200 mg/dL   HDL 37 (L) > OR = 40 mg/dL   Triglycerides 100 <150 mg/dL   LDL Cholesterol (Calc) 106 (H) mg/dL (calc)   Total CHOL/HDL Ratio 4.4 <5.0 (calc)   Non-HDL Cholesterol (Calc) 126 <130 mg/dL (calc)  PSA  Result Value Ref Range   PSA 24.34 (H) < OR = 4.0 ng/mL      Assessment & Plan:  Problem List Items Addressed This Visit    HLD (hyperlipidemia)   Relevant Medications   aspirin EC 81 MG tablet   lisinopril (ZESTRIL) 20 MG tablet   terazosin (HYTRIN) 5 MG capsule   Other Relevant Orders    Ambulatory referral to [place]   History of cerebrovascular accident (CVA) with residual deficit   Relevant Medications   aspirin EC 81 MG tablet   clopidogrel (PLAVIX) 75 MG tablet   Other Relevant Orders   Ambulatory referral to [place] hypertension   Relevant Medications   aspirin EC 81 MG tablet   lisinopril (ZESTRIL) 20 MG tablet   terazosin (HYTRIN) 5 MG capsule   Cognitive deficit as late effect of cerebrovascular accident (CVA) - Primary   Relevant Orders   Ambulatory referral to [facility]   CKD (chronic kidney disease), stage III ([place])   Relevant Orders   Ambulatory referral to [facility]   BPH with obstruction/lower urinary tract symptoms   Relevant Medications   terazosin (HYTRIN) 5 MG capsule   Benign hypertension with CKD (chronic kidney disease) stage III (H[facility])   Relevant Medications   aspirin EC 81 MG tablet   lisinopril (ZESTRIL) 20 MG tablet   terazosin (HYTRIN) 5 MG capsule   Other Relevant Orders   Ambulatory referral to [facility]      #Hx CVA, residual deficit Cognitive decline after stroke 2019 He has had progressive decline DAPT Plavix 75mg  and ASA 81mg  daily  #HTN Improved on medication Lisinopril 20mg  daily in AM Terazosin 5mg  nightly PM  #HLD Need to restart Atorvastatin. New order 40mg  daily PM  #BPH Chronic issue with LUTS, difficulty with voiding. Weaker stream urinary frequency Restart Terazosin 5mg  daily +Urology apt tomorrow, elevated PSA >24  Multiple chronic medication conditions, inadequately controlled recently now has help of family friend from church - [name] can help as caregiver with other neighbor and friends.  CCM team is involved.  Here today to get medication organized.  Referral to [facility] for med management, decline in function, memory - Re-schedule CCM Call Pharmacy with [name] - help with pharmacy doing a pill pack for him.  Today wrote detailed list of what  medications are for and when to take them, marked on bottles  Finish levaquin last 9 days once daily per Wound Care.    Orders Placed This Encounter  Procedures  . Ambulatory referral to Home Health    Referral Priority:   Routine    Referral Type:   Home Health Care    Referral Reason:   Specialty Services Required    Requested Specialty:   [place]    Number of Visits Requested:   1     No orders of the defined types were placed in this encounter.    Follow up plan: Return in about 3 months (around 09/25/2020) for 3 month follow-up HTN Memory, Meds.    Nobie Putnam, [facility] 06/25/2020, 9:05 AM

## 2020-06-25 NOTE — Telephone Encounter (Signed)
The prescription sent to the [facility]. The pt friend [name] was called and notified that the prescription was sent over.

## 2020-06-26 ENCOUNTER — Encounter: Payer: Self-pay | Admitting: Urology

## 2020-06-26 ENCOUNTER — Telehealth: Payer: Self-pay

## 2020-06-26 ENCOUNTER — Ambulatory Visit (INDEPENDENT_AMBULATORY_CARE_PROVIDER_SITE_OTHER): Payer: Medicare Other | Admitting: Urology

## 2020-06-26 VITALS — BP 160/74 | HR 73 | Ht 70.0 in | Wt 224.0 lb

## 2020-06-26 DIAGNOSIS — N401 Enlarged prostate with lower urinary tract symptoms: Secondary | ICD-10-CM

## 2020-06-26 DIAGNOSIS — R972 Elevated prostate specific antigen [PSA]: Secondary | ICD-10-CM | POA: Diagnosis not present

## 2020-06-26 DIAGNOSIS — N138 Other obstructive and reflux uropathy: Secondary | ICD-10-CM

## 2020-06-26 DIAGNOSIS — R3989 Other symptoms and signs involving the genitourinary system: Secondary | ICD-10-CM | POA: Diagnosis not present

## 2020-06-26 LAB — BLADDER SCAN AMB NON-IMAGING

## 2020-06-26 NOTE — Patient Instructions (Signed)
Prostate MRI Prep: ? ?1- No ejaculation 48 hours prior to exam ? ?2- No food or drink or caffeine 4 hours prior to exam ? ?3- Fleets enema needs to be done 4 hours prior to exam  ? ?4- Urinate just prior to exam  ?

## 2020-06-26 NOTE — Telephone Encounter (Signed)
Alright. Thanks for update. They may need some out of pocket intermittent help then in the future. Probably my guess is we can hold off for now and try to aggressively manage while working with his caregiver now. If they need help or cannot keep up - they should be aware that may need to do out of pocket extra PCS services if needed.  Nobie Putnam, [facility] 06/26/2020, 4:45 PM

## 2020-06-26 NOTE — Telephone Encounter (Signed)
FYI to [facility] Clinical Pool.  Will route this to CCM Team:  We are having a Home Health crisis lately with regards to availability. Multiple patients who easily qualify and need home health services, all agencies are not accepting patients due to staffing.  For this patient who needs help with medication, I imagine next best step may be trying to get [facility] setup for him.  For now, I am comfortable with patient's caregiver/friend assisting with med pill boxes  Jerene Pitch, would you be able to assist in working towards initiating PCS for this patient if he qualifies?  Nobie Putnam, [facility] 06/26/2020, 4:30 PM

## 2020-06-26 NOTE — Telephone Encounter (Signed)
[name] message stating she cannot get Home Health service to take him as a patient due to staffing issues. She sent the referral to [name], [facility], [facility],and [facility].

## 2020-06-26 NOTE — Progress Notes (Signed)
HAYK, DIVIS ([id number]) Visit Report for 06/24/2020 Arrival Information Details [name], [name] Date of Service: 06/24/2020 9:15 AM Medical Record Number: [id number] Patient Account Number: [id number] Date of Birth/Sex: 1934-07-15 (85 y.o. M) Treating RN: Dolan Amen Primary Care Pavan Bring: Nobie Putnam Other Clinician: Jeanine Luz Referring Marranda Arakelian: Nobie Putnam Treating Doniel Maiello/Extender: Skipper Cliche in Treatment: 3 Visit Information History Since Last Visit Pain Present Now: No Patient Arrived: Ambulatory Arrival Time: 09:33 Accompanied By: friend Transfer Assistance: None Patient Identification Verified: Yes Secondary Verification Process Completed: Yes Patient Has Alerts: Yes Patient Alerts: Not Diabetic Plavix Electronic Signature(s) Signed: 06/24/2020 1:33:39 PM By: Georges Mouse, Minus Breeding RN Entered By: Georges Mouse, Minus Breeding on 06/24/2020 09:35:31 Pavao, [name] ([id number]) -------------------------------------------------------------------------------- Clinic Level of Care Assessment Details [name] Date of Service: 06/24/2020 9:15 AM Medical Record Number: [id number] Patient Account Number: [id number] Date of Birth/Sex: May 20, 1934 (85 y.o. M) Treating RN: Carlene Coria Primary Care Vicente Weidler: Nobie Putnam Other Clinician: Jeanine Luz Referring Niah Heinle: Nobie Putnam Treating Wilburt Messina/Extender: Skipper Cliche in Treatment: 3 Clinic Level of Care Assessment Items TOOL 4 Quantity Score X - Use when only an EandM is performed on FOLLOW-UP visit 1 0 ASSESSMENTS - Nursing Assessment / Reassessment X - Reassessment of Co-morbidities (includes updates in patient status) 1 10 X- 1 5 Reassessment of Adherence to Treatment Plan ASSESSMENTS - Wound and Skin Assessment / Reassessment X - Simple Wound Assessment / Reassessment - one wound 1 5 []  - 0 Complex Wound Assessment / Reassessment  - multiple wounds []  - 0 Dermatologic / Skin Assessment (not related to wound area) ASSESSMENTS - Focused Assessment []  - Circumferential Edema Measurements - multi extremities 0 []  - 0 Nutritional Assessment / Counseling / Intervention []  - 0 Lower Extremity Assessment (monofilament, tuning fork, pulses) []  - 0 Peripheral Arterial Disease Assessment (using hand held doppler) ASSESSMENTS - Ostomy and/or Continence Assessment and Care []  - Incontinence Assessment and Management 0 []  - 0 Ostomy Care Assessment and Management (repouching, etc.) PROCESS - Coordination of Care X - Simple Patient / Family Education for ongoing care 1 15 []  - 0 Complex (extensive) Patient / Family Education for ongoing care []  - 0 Staff obtains Programmer, systems, Records, Test Results / Process Orders []  - 0 Staff telephones HHA, Nursing Homes / Clarify orders / etc []  - 0 Routine Transfer to another Facility (non-emergent condition) []  - 0 Routine Hospital Admission (non-emergent condition) []  - 0 New Admissions / Biomedical engineer / Ordering NPWT, Apligraf, etc. []  - 0 Emergency Hospital Admission (emergent condition) X- 1 10 Simple Discharge Coordination []  - 0 Complex (extensive) Discharge Coordination PROCESS - Special Needs []  - Pediatric / Minor Patient Management 0 []  - 0 Isolation Patient Management []  - 0 Hearing / Language / Visual special needs []  - 0 Assessment of Community assistance (transportation, D/C planning, etc.) []  - 0 Additional assistance / Altered mentation []  - 0 Support Surface(s) Assessment (bed, cushion, seat, etc.) INTERVENTIONS - Wound Cleansing / Measurement Leib, Phong D. ([id number]) X- 1 5 Simple Wound Cleansing - one wound []  - 0 Complex Wound Cleansing - multiple wounds X- 1 5 Wound Imaging (photographs - any number of wounds) []  - 0 Wound Tracing (instead of photographs) X- 1 5 Simple Wound Measurement - one wound []  - 0 Complex Wound Measurement  - multiple wounds INTERVENTIONS - Wound Dressings X - Small Wound Dressing one or multiple wounds 1 10 []  - 0 Medium Wound Dressing one or multiple wounds []  -  0 Large Wound Dressing one or multiple wounds X- 1 5 Application of Medications - topical []  - 0 Application of Medications - injection INTERVENTIONS - Miscellaneous []  - External ear exam 0 []  - 0 Specimen Collection (cultures, biopsies, blood, body fluids, etc.) []  - 0 Specimen(s) / Culture(s) sent or taken to Lab for analysis []  - 0 Patient Transfer (multiple staff / Civil Service fast streamer / Similar devices) []  - 0 Simple Staple / Suture removal (25 or less) []  - 0 Complex Staple / Suture removal (26 or more) []  - 0 Hypo / Hyperglycemic Management (close monitor of Blood Glucose) []  - 0 Ankle / Brachial Index (ABI) - do not check if billed separately X- 1 5 Vital Signs Has the patient been seen at the hospital within the last three years: Yes Total Score: 80 Level Of Care: New/Established - Level 3 Electronic Signature(s) Signed: 06/26/2020 12:09:52 PM By: Carlene Coria RN Entered By: Carlene Coria on 06/24/2020 10:12:46 [place], Jiovanni DMarland Kitchen ([id number]) -------------------------------------------------------------------------------- Lower Extremity Assessment Details [name] Date of Service: 06/24/2020 9:15 AM Medical Record Number: [id number] Patient Account Number: [id number] Date of Birth/Sex: Aug 25, 1934 (85 y.o. M) Treating RN: Dolan Amen Primary Care Harding Thomure: Nobie Putnam Other Clinician: Jeanine Luz Referring Cherissa Hook: Nobie Putnam Treating Damara Klunder/Extender: Skipper Cliche in Treatment: 3 Edema Assessment Assessed: [Left: No] Patrice Paradise: Yes] Edema: [Left: Ye] [Right: s] Calf Left: Right: Point of Measurement: 31 cm From Medial Instep 40 cm Ankle Left: Right: Point of Measurement: 12 cm From Medial Instep 27.5 cm Vascular Assessment Pulses: Dorsalis Pedis Palpable:  [Right:Yes] Electronic Signature(s) Signed: 06/24/2020 1:33:39 PM By: Georges Mouse, Minus Breeding RN Entered By: Georges Mouse, Minus Breeding on 06/24/2020 09:45:15 Brannan, [name] ([id number]) -------------------------------------------------------------------------------- Multi Wound Chart Details [name] Date of Service: 06/24/2020 9:15 AM Medical Record Number: [id number] Patient Account Number: [id number] Date of Birth/Sex: 1934-11-10 (85 y.o. M) Treating RN: Carlene Coria Primary Care Chloey Ricard: Nobie Putnam Other Clinician: Jeanine Luz Referring Ladeana Laplant: Nobie Putnam Treating Burnham Trost/Extender: Skipper Cliche in Treatment: 3 Vital Signs Height(in): 71 Pulse(bpm): 78 Weight(lbs): 216 Blood Pressure(mmHg): 192/91 Body Mass Index(BMI): 30 Temperature(F): 98.6 Respiratory Rate(breaths/min): 20 Photos: [1:No Photos] [N/A:N/A] Wound Location: [1:Right, Lateral Foot] [N/A:N/A] Wounding Event: [1:Pressure Injury] [N/A:N/A] Primary Etiology: [1:Pressure Ulcer] [N/A:N/A] Comorbid History: [1:Cataracts, Hypertension] [N/A:N/A] Date Acquired: [1:04/03/2020] [N/A:N/A] Weeks of Treatment: [1:3] [N/A:N/A] Wound Status: [1:Open] [N/A:N/A] Measurements L x W x D (cm) [1:5x5.5x0.4] [N/A:N/A] Area (cm) : [1:21.598] [N/A:N/A] Volume (cm) : [1:8.639] [N/A:N/A] % Reduction in Area: [1:3.70%] [N/A:N/A] % Reduction in Volume: [1:23.00%] [N/A:N/A] Classification: [1:Unstageable/Unclassified] [N/A:N/A] Exudate Amount: [1:Medium] [N/A:N/A] Exudate Type: [1:Serosanguineous] [N/A:N/A] Exudate Color: [1:red, brown] [N/A:N/A] Foul Odor After Cleansing: [1:Yes] [N/A:N/A] Odor Anticipated Due to Product [1:No] [N/A:N/A] Use: Wound Margin: [1:Thickened] [N/A:N/A] Granulation Amount: [1:Small (1-33%)] [N/A:N/A] Granulation Quality: [1:Pink] [N/A:N/A] Necrotic Amount: [1:Large (67-100%)] [N/A:N/A] Necrotic Tissue: [1:Eschar, Adherent Slough] [N/A:N/A] Exposed  Structures: [1:Fat Layer (Subcutaneous Tissue): Yes Fascia: No Tendon: No Muscle: No Joint: No Bone: No None] [N/A:N/A N/A] Treatment Notes Electronic Signature(s) Signed: 06/26/2020 12:09:52 PM By: Carlene Coria RN Entered By: Carlene Coria on 06/24/2020 10:03:04 Lunde, [name] ([id number]) -------------------------------------------------------------------------------- [place] Details [name] Date of Service: 06/24/2020 9:15 AM Medical Record Number: [id number] Patient Account Number: [id number] Date of Birth/Sex: [birth year] (85 y.o. M) Treating RN: Carlene Coria Primary Care Cole Eastridge: Nobie Putnam Other Clinician: Jeanine Luz Referring Cleve Paolillo: Nobie Putnam Treating Stellar Gensel/Extender: Skipper Cliche in Treatment: 3 Active Inactive Wound/Skin Impairment Nursing Diagnoses: Knowledge  deficit related to ulceration/compromised skin integrity Goals: Patient/caregiver will verbalize understanding of skin care regimen Date Initiated: 06/03/2020 Target Resolution Date: 07/01/2020 Goal Status: Active Ulcer/skin breakdown will have a volume reduction of 30% by week 4 Date Initiated: 06/03/2020 Target Resolution Date: 07/01/2020 Goal Status: Active Interventions: Assess patient/caregiver ability to obtain necessary supplies Assess patient/caregiver ability to perform ulcer/skin care regimen upon admission and as needed Assess ulceration(s) every visit Notes: Electronic Signature(s) Signed: 06/26/2020 12:09:52 PM By: Carlene Coria RN Entered By: Carlene Coria on 06/24/2020 10:02:42 Silvera, [name] ([id number]) -------------------------------------------------------------------------------- Pain Assessment Details [name] Date of Service: 06/24/2020 9:15 AM Medical Record Number: [id number] Patient Account Number: [id number] Date of Birth/Sex: December 05, 1934 (85 y.o. M) Treating RN: Dolan Amen Primary Care  Caeley Dohrmann: Nobie Putnam Other Clinician: Jeanine Luz Referring Avion Patella: Nobie Putnam Treating Khalila Buechner/Extender: Skipper Cliche in Treatment: 3 Active Problems Location of Pain Severity and Description of Pain Patient Has Paino No Site Locations Rate the pain. Current Pain Level: 0 Pain Management and Medication Current Pain Management: Electronic Signature(s) Signed: 06/24/2020 1:33:39 PM By: Georges Mouse, Minus Breeding RN Entered By: Georges Mouse, Minus Breeding on 06/24/2020 09:38:19 [place]ano, [name] ([id number]) -------------------------------------------------------------------------------- Patient/Caregiver Education Details [name] Date of Service: 06/24/2020 9:15 AM Medical Record Number: [id number] Patient Account Number: [id number] Date of Birth/Gender: [birth year] (85 y.o. M) Treating RN: Carlene Coria Primary Care Physician: Nobie Putnam Other Clinician: Jeanine Luz Referring Physician: Nobie Putnam Treating Physician/Extender: Skipper Cliche in Treatment: 3 Education Assessment Education Provided To: Patient Education Topics Provided Wound/Skin Impairment: Methods: Explain/Verbal Responses: State content correctly Electronic Signature(s) Signed: 06/26/2020 12:09:52 PM By: Carlene Coria RN Entered By: Carlene Coria on 06/24/2020 10:13:00 Furry, [name] ([id number]) -------------------------------------------------------------------------------- Wound Assessment Details [name] Date of Service: 06/24/2020 9:15 AM Medical Record Number: [id number] Patient Account Number: [id number] Date of Birth/Sex: 11-23-1934 (85 y.o. M) Treating RN: Dolan Amen Primary Care Jaquon Gingerich: Nobie Putnam Other Clinician: Jeanine Luz Referring Iriana Artley: Nobie Putnam Treating Archer Vise/Extender: Skipper Cliche in Treatment: 3 Wound Status Wound Number: 1 Primary Etiology: Pressure  Ulcer Wound Location: Right, Lateral Foot Wound Status: Open Wounding Event: Pressure Injury Comorbid History: Cataracts, Hypertension Date Acquired: 04/03/2020 Weeks Of Treatment: 3 Clustered Wound: No Wound Measurements Length: (cm) 5 Width: (cm) 5.5 Depth: (cm) 0.4 Area: (cm) 21.598 Volume: (cm) 8.639 % Reduction in Area: 3.7% % Reduction in Volume: 23% Epithelialization: None Tunneling: No Undermining: No Wound Description Classification: Unstageable/Unclassified Wound Margin: Thickened Exudate Amount: Medium Exudate Type: Serosanguineous Exudate Color: red, brown Foul Odor After Cleansing: Yes Due to Product Use: No Slough/Fibrino Yes Wound Bed Granulation Amount: Small (1-33%) Exposed Structure Granulation Quality: Pink Fascia Exposed: No Necrotic Amount: Large (67-100%) Fat Layer (Subcutaneous Tissue) Exposed: Yes Necrotic Quality: Eschar, Adherent Slough Tendon Exposed: No Muscle Exposed: No Joint Exposed: No Bone Exposed: No Electronic Signature(s) Signed: 06/24/2020 1:33:39 PM By: Georges Mouse, Minus Breeding RN Entered By: Georges Mouse, Minus Breeding on 06/24/2020 09:42:05 Provencher, [name] ([id number]) -------------------------------------------------------------------------------- Vitals Details [name] Date of Service: 06/24/2020 9:15 AM Medical Record Number: [id number] Patient Account Number: [id number] Date of Birth/Sex: 01/12/1935 (85 y.o. M) Treating RN: Dolan Amen Primary Care Glendi Mohiuddin: Nobie Putnam Other Clinician: Jeanine Luz Referring Cherylynn Liszewski: Nobie Putnam Treating Shadrack Brummitt/Extender: Skipper Cliche in Treatment: 3 Vital Signs Time Taken: 09:35 Temperature (F): 98.6 Height (in): 71 Pulse (bpm): 78 Weight (lbs): 216 Respiratory Rate (breaths/min): 20 Body Mass Index (BMI): 30.1 Blood Pressure (mmHg): 192/91 Reference Range: 80 -  120 mg / dl Electronic Signature(s) Signed: 06/24/2020 1:33:39 PM By:  Georges Mouse, Minus Breeding RN Entered By: Georges Mouse, Minus Breeding on 06/24/2020 09:38:12

## 2020-06-27 ENCOUNTER — Ambulatory Visit: Payer: Self-pay | Admitting: General Practice

## 2020-06-27 ENCOUNTER — Telehealth: Payer: Self-pay | Admitting: General Practice

## 2020-06-27 DIAGNOSIS — L97513 Non-pressure chronic ulcer of other part of right foot with necrosis of muscle: Secondary | ICD-10-CM

## 2020-06-27 DIAGNOSIS — I129 Hypertensive chronic kidney disease with stage 1 through stage 4 chronic kidney disease, or unspecified chronic kidney disease: Secondary | ICD-10-CM

## 2020-06-27 DIAGNOSIS — I69319 Unspecified symptoms and signs involving cognitive functions following cerebral infarction: Secondary | ICD-10-CM

## 2020-06-27 DIAGNOSIS — R413 Other amnesia: Secondary | ICD-10-CM

## 2020-06-27 DIAGNOSIS — E782 Mixed hyperlipidemia: Secondary | ICD-10-CM

## 2020-06-27 NOTE — Chronic Care Management (AMB) (Signed)
Chronic Care Management   CCM RN Visit Note  06/27/2020 Name: [name] MRN: [id number] DOB: 1934-06-21  Subjective: [name] is a 86 y.o. year old male who is a primary care patient of [name], DO. The care management team was consulted for assistance with disease management and care coordination needs.    Collaboration with with the patients friend, [name], who also has ROI priveledges by telephone  for follow up visit in response to provider referral for case management and/or care coordination services.   Consent to Services:  The patient was given information about Chronic Care Management services, agreed to services, and gave verbal consent prior to initiation of services.  Please see initial visit note for detailed documentation.   Patient agreed to services and verbal consent obtained.   Assessment: Review of patient past medical history, allergies, medications, health status, including review of consultants reports, laboratory and other test data, was performed as part of comprehensive evaluation and provision of chronic care management services.   SDOH (Social Determinants of Health) assessments and interventions performed:    CCM Care Plan  No Known Allergies  Outpatient Encounter Medications as of 06/27/2020  Medication Sig   aspirin EC 81 MG tablet Take 1 tablet (81 mg total) by mouth in the morning. (blood thinner)   atorvastatin (LIPITOR) 40 MG tablet Take 1 tablet (40 mg total) by mouth at bedtime. (Cholesterol)   clopidogrel (PLAVIX) 75 MG tablet Take 1 tablet (75 mg total) by mouth in the morning. (blood thinner)   levofloxacin (LEVAQUIN) 500 MG tablet Take 500 mg by mouth daily. (Patient not taking: No sig reported)   lisinopril (ZESTRIL) 20 MG tablet Take 1 tablet (20 mg total) by mouth in the morning. (Blood pressure)   terazosin (HYTRIN) 5 MG capsule Take 1 capsule (5 mg total) by mouth at bedtime. (blood pressure, prostate)    No facility-administered encounter medications on file as of 06/27/2020.    Patient Active Problem List   Diagnosis Date Noted   Cognitive deficit as late effect of cerebrovascular accident (CVA) 06/25/2020   Essential hypertension 06/25/2020   Imbalance 02/01/2018   Humeral head fracture, left, closed, initial encounter 09/22/2017   History of cerebrovascular accident (CVA) with residual deficit 09/09/2017   CKD (chronic kidney disease), stage III ([place]) 09/01/2017   Elevated PSA, between 10 and less than 20 ng/ml 07/22/2016   Bilateral lower extremity edema 07/22/2016   Abnormal glucose 07/15/2016   BPH with obstruction/lower urinary tract symptoms 04/10/2016   Benign hypertension with CKD (chronic kidney disease) stage III ([place]) 04/10/2016   Memory loss 04/10/2016   Gastric outlet obstruction 12/02/2014   Generalized abdominal pain    Benign fibroma of prostate 01/02/2011   HLD (hyperlipidemia) 01/02/2011   Obesity (BMI 30.0-34.9) 01/02/2011    Conditions to be addressed/monitored:HTN, HLD and Memory loss and wound to right heel   Care Plan : RNCM: Memory Loss  Updates made by [name] since 06/27/2020 12:00 AM    Problem: RNCM: Memory Loss   Priority: High    Long-Range Goal: RNCM: Memory Loss   Priority: High  Note:   Current Barriers:   Knowledge Deficits related to resources to assist with patient with changes in memory and being forgetful to take medications and care for self   Chronic Disease Management support and education needs related to memory loss and changes   Lacks caregiver support.   Non-adherence to scheduled provider appointments  Non-adherence to  prescribed medication regimen  Unable to self administer medications as prescribed  Does not attend all scheduled provider appointments  Does not adhere to prescribed medication regimen  Lacks social connections  Unable to perform IADLs independently  Does not maintain  contact with provider office  Does not contact provider office for questions/concerns  Nurse Case Manager Clinical Goal(s):   patient will work with CCM team and pcp  to address needs related to memory loss and changes in chronic conditions   patient will attend all scheduled medical appointments: 09-30-2020  patient will demonstrate improved adherence to prescribed treatment plan for wound care and other chronic conditions  as evidenced bykeeping appointments, taking medications as prescribed, and working with the CCM team to optimize health and well being   patient will work with CM team pharmacist to assist with medication needs and pill packaging.   patient will work with CM clinical social worker to help with resources for help in the home and changes in memory  Interventions:   1:1 collaboration with Parks Ranger, Devonne Doughty, DO regarding development and update of comprehensive plan of care as evidenced by provider attestation and co-signature  Inter-disciplinary care team collaboration (see longitudinal plan of care)  Evaluation of current treatment plan related to memory loss  and patient's adherence to plan as established by provider.  Advised patient to call the office for changes or questions   Provided education to patient re: CCM team and the role of the CCM team in helping the patient meet needs  Reviewed medications with patient and discussed compliance, the friend assisting the patient would like to get the pill packs for the patient. Pharmacist scheduled to call next week.   Collaborated with CCM team and pcp  regarding effective management of memory changes and support to the patient   Reviewed scheduled/upcoming provider appointments including: 09-30-2020   Social Work referral for memory changes and support  Pharmacy referral for medications management and pill packaging system   Discussed plans with patient for ongoing care management follow up and provided  patient with direct contact information for care management team  Patient Goals/Self-Care Activities Over the next 120 days, patient will:  - Patient will self administer medications as prescribed Patient will attend all scheduled provider appointments Patient will call pharmacy for medication refills Patient will attend church or other social activities Patient will continue to perform ADL's independently Patient will continue to perform IADL's independently Patient will call provider office for new concerns or questions Patient will work with BSW to address care coordination needs and will continue to work with the clinical team to address health care and disease management related needs.    - action plan for worsening symptoms mutually developed - community resource information provided - medication list reviewed - medication side effects managed - pain assessed - pain management plan developed - response to pharmacologic therapy monitored - symptom review completed Follow Up Plan: Telephone follow up appointment with care management team member scheduled for: 08-08-2020 at 345 pm       Task: RNCM: Develop Strategies to Manage Behavior   Note:   Care Management Activities:    - action plan for worsening symptoms mutually developed - community resource information provided - medication list reviewed - medication side effects managed - pain assessed - pain management plan developed - response to pharmacologic therapy monitored - symptom review completed       Care Plan : RNCM: Hypertension (Adult)  Updates made by [name]  since 06/27/2020 12:00 AM    Problem: RNCM: Hypertension (Hypertension)   Priority: Medium    Long-Range Goal: RNCM: Hypertension Monitored   Priority: Medium  Note:   Objective:   Last practice recorded BP readings:  BP Readings from Last 3 Encounters:  06/26/20 (!) 160/74  06/25/20 140/84  06/13/20 (!) 151/78     Most recent  eGFR/CrCl: No results found for: EGFR  No components found for: CRCL Current Barriers:   Knowledge Deficits related to basic understanding of hypertension pathophysiology and self care management  Knowledge Deficits related to understanding of medications prescribed for management of hypertension  Non-adherence to prescribed medication regimen  Non-adherence to scheduled provider appointments  Limited Social Support  Unable to independently manage HTN  Unable to self administer medications as prescribed  Does not attend all scheduled provider appointments  Does not adhere to prescribed medication regimen  Lacks social connections  Unable to perform IADLs independently  Does not maintain contact with provider office  Does not contact provider office for questions/concerns Case Manager Clinical Goal(s):   Over the next 120 days, patient will verbalize understanding of plan for hypertension management  Over the next 120 days, patient will attend all scheduled medical appointments: 09-30-2020  Over the next 120 days, patient will demonstrate improved adherence to prescribed treatment plan for hypertension as evidenced by taking all medications as prescribed, monitoring and recording blood pressure as directed, adhering to low sodium/DASH diet  Over the next 120 days, patient will demonstrate improved health management independence as evidenced by checking blood pressure as directed and notifying PCP if SBP>160 or DBP > 90, taking all medications as prescribe, and adhering to a low sodium diet as discussed.  Over the next 120 days, patient will verbalize basic understanding of hypertension disease process and self health management plan as evidenced by compliance with medications, compliance with dietary habits- heart healthy/ADA Diet, and working with the CCM team to meet health and wellness goals  Interventions:   Collaboration with Parks Ranger, Devonne Doughty, DO regarding  development and update of comprehensive plan of care as evidenced by provider attestation and co-signature  Inter-disciplinary care team collaboration (see longitudinal plan of care)  Evaluation of current treatment plan related to hypertension self management and patient's adherence to plan as established by provider.  Provided education to patient re: stroke prevention, s/s of heart attack and stroke, DASH diet, complications of uncontrolled blood pressure  Reviewed medications with patient and discussed importance of compliance  Discussed plans with patient for ongoing care management follow up and provided patient with direct contact information for care management team  Advised patient, providing education and rationale, to monitor blood pressure daily and record, calling PCP for findings outside established parameters.   Reviewed scheduled/upcoming provider appointments including: 09-30-2020 Patient Goals: - blood pressure trends reviewed - depression screen reviewed - home or ambulatory blood pressure monitoring encouraged Self-Care Activities: - Self administers medications as prescribed Attends all scheduled provider appointments Calls provider office for new concerns, questions, or BP outside discussed parameters Checks BP and records as discussed Follows a low sodium diet/DASH diet Follow Up Plan: Telephone follow up appointment with care management team member scheduled for: 08-08-2020 at 345 pm   Task: RNCM: Identify and Monitor Blood Pressure Elevation   Note:   Care Management Activities:    - blood pressure trends reviewed - depression screen reviewed - home or ambulatory blood pressure monitoring encouraged       Care Plan : RNCM: HLD  Management  Updates made by [name] since 06/27/2020 12:00 AM    Problem: RNCM: Management of HLD   Priority: Medium    Long-Range Goal: RNCM: Management of HLD   Priority: Medium  Note:   Current Barriers:   Poorly  controlled hyperlipidemia, complicated by Memory changes and non-compliance with medications   Current antihyperlipidemic regimen: Atorvastatin 40 mg QD   Most recent lipid panel:     Component Value Date/Time   CHOL 163 06/13/2020 1113   TRIG 100 06/13/2020 1113   HDL 37 (L) 06/13/2020 1113   CHOLHDL 4.4 06/13/2020 1113   VLDL 21 07/20/2016 0001   LDLCALC 106 (H) 06/13/2020 1113     ASCVD risk enhancing conditions: age >48,  HTN, former smoker  Unable to independently manage chronic conditions due to memory impairment   Unable to self administer medications as prescribed  Does not attend all scheduled provider appointments  Does not adhere to prescribed medication regimen  Lacks social connections  Unable to perform IADLs independently  Does not maintain contact with provider office  Does not contact provider office for questions/concerns RN Care Manager Clinical Goal(s):   patient will work with Consulting civil engineer, providers, and care team towards execution of optimized self-health management plan  patient will verbalize understanding of plan for effective management of HLD   patient will work with [facility], CCM team and pcp  to address needs related to effective management of HLD   patient will attend all scheduled medical appointments: 09-30-2020  Interventions:  Collaboration with [name], DO regarding development and update of comprehensive plan of care as evidenced by provider attestation and co-signature  Inter-disciplinary care team collaboration (see longitudinal plan of care)  Medication review performed; medication list updated in electronic medical record.   Inter-disciplinary care team collaboration (see longitudinal plan of care)  Referred to pharmacy team for assistance with HLD medication management  Evaluation of current treatment plan related to HLD  and patient's adherence to plan as established by provider.  Advised patient to call  the office for changes in conditions or questions  Provided education to patient re: effective management of HLD   Reviewed medications with patient and discussed compliance, the patient has not been taking medications as prescribed, his friend [name] has been helping him this week and they feel like they are back on track. Does want to talk to the pharmacist about pill packaging system.   Collaborated with CCM team and pcp  regarding changes in the patients condition and new needs the patient has.    Reviewed scheduled/upcoming provider appointments including: Reviewed with [name] all upcoming appointments the patient has and next pcp appointment on 09-30-2020  Discussed plans with patient for ongoing care management follow up and provided patient with direct contact information for care management team Patient Goals/Self-Care Activities: - call for medicine refill 2 or 3 days before it runs out - call if I am sick and can't take my medicine - keep a list of all the medicines I take; vitamins and herbals too - learn to read medicine labels - use a pillbox to sort medicine - use an alarm clock or phone to remind me to take my medicine - change to whole grain breads, cereal, pasta - drink 6 to 8 glasses of water each day - eat 3 to 5 servings of fruits and vegetables each day - eat 5 or 6 small meals each day - fill half the plate with nonstarchy  vegetables - limit fast food meals to no more than 1 per week - manage portion size - prepare main meal at home 3 to 5 days each week - read food labels for fat, fiber, carbohydrates and portion size - be open to making changes - I can manage, know and watch for signs of a heart attack - if I have chest pain, call for help - learn about small changes that will make a big difference - learn my personal risk factors  Follow Up Plan: Telephone follow up appointment with care management team member scheduled for: 08-08-2020 at 345 pm      Task: RNCM: Management of HLD   Note:   Care Management Activities:    - barriers to meeting goals identified - change-talk evoked - choices provided - collaboration with team encouraged - decision-making supported - difficulty of making life-long changes acknowledged - health risks reviewed - problem-solving facilitated - questions answered - readiness for change evaluated - reassurance provided - resources needed to meet goals identified - self-reflection promoted - self-reliance encouraged       Care Plan : RNCM: Management of wound to right heel  Updates made by [name] since 06/27/2020 12:00 AM    Problem: RNCM: Management of wound to right heel   Priority: High    Long-Range Goal: RNCM: Management of Right heel wound   Priority: High  Note:   Current Barriers:   Knowledge Deficits related to resources to help in the management of chronic condtions  Chronic Disease Management support and education needs related to wound to right heel  Lacks caregiver support.   Non-adherence to scheduled provider appointments  Non-adherence to prescribed medication regimen  Unable to independently manage wound to right heel   Unable to self administer medications as prescribed  Does not attend all scheduled provider appointments  Does not adhere to prescribed medication regimen  Lacks social connections  Unable to perform IADLs independently  Does not maintain contact with provider office  Does not contact provider office for questions/concerns  Nurse Case Manager Clinical Goal(s):   patient will verbalize understanding of plan for effective management of wound to right heel   patient will work with [facility], CCM team, pcp and specialist  to address needs related to wound to right heel   patient will attend all scheduled medical appointments: 09-30-2020 and wound center appointments   patient will demonstrate improved adherence to prescribed treatment plan for  wound to right heel as evidenced bykeeping appointments, taking medications as prescribed, healing wound, and working with the CCM team to effectively manage health and well being   Interventions:   1:1 collaboration with [name], DO regarding development and update of comprehensive plan of care as evidenced by provider attestation and co-signature  Inter-disciplinary care team collaboration (see longitudinal plan of care)  Evaluation of current treatment plan related to wound care to right heel  and patient's adherence to plan as established by provider.  Advised patient to call for new concerns or questions   Provided education to patient re: keeping appointments with the wound clinic and following recommendations by the specialist for effective healing of right heel wound   Collaborated with RNCM, CCM team, pcp and specialist  regarding care of right heel wound   Reviewed scheduled/upcoming provider appointments including: 09-30-2020, also reviewed upcoming appointments to the wound center with [name], friend for the patient.   Discussed plans with patient for ongoing care management follow up and  provided patient with direct contact information for care management team  Patient Goals/Self-Care Activities Over the next 120 days, patient will:  - Patient will self administer medications as prescribed Patient will attend all scheduled provider appointments Patient will call pharmacy for medication refills Patient will attend church or other social activities Patient will continue to perform ADL's independently Patient will continue to perform IADL's independently Patient will call provider office for new concerns or questions Patient will work with BSW to address care coordination needs and will continue to work with the clinical team to address health care and disease management related needs.    - barriers to meeting goals identified - change-talk evoked -  choices provided - collaboration with team encouraged - decision-making supported - health risks reviewed - problem-solving facilitated - questions answered - readiness for change evaluated - reassurance provided - resources needed to meet goals identified - self-reflection promoted - self-reliance encouraged Follow Up Plan: Telephone follow up appointment with care management team member scheduled for: 08-08-2020 at 345 pm       Task: RNCM: Wound to Right heel   Note:   Care Management Activities:    - barriers to meeting goals identified - change-talk evoked - choices provided - collaboration with team encouraged - decision-making supported - health risks reviewed - problem-solving facilitated - questions answered - readiness for change evaluated - reassurance provided - resources needed to meet goals identified - self-reflection promoted - self-reliance encouraged       Plan:Telephone follow up appointment with care management team member scheduled for:  08-08-2020 at 345 pm   [place], MSN, [place] Mobile: [phone]

## 2020-06-27 NOTE — Patient Instructions (Signed)
Visit Information  PATIENT GOALS: Goals Addressed              This Visit's Progress   .  RNCM: Manage My Medicine        Timeframe:  Short-Term Goal Priority:  High Start Date:                             Expected End Date:                       Follow Up Date 08-08-2020   - call for medicine refill 2 or 3 days before it runs out - call if I am sick and can't take my medicine - keep a list of all the medicines I take; vitamins and herbals too - learn to read medicine labels - use a pillbox to sort medicine - use an alarm clock or phone to remind me to take my medicine    Why is this important?   . These steps will help you keep on track with your medicines.   Notes: [name] is interested in getting the patient on pill packaging system to help him remember to take. Has an appointment next week with pharm D    .  COMPLETED: RNCM: PT-"Something is wrong with my foot" (pt-stated)        CARE PLAN ENTRY (see longtitudinal plan of care for additional care plan information)  Current Barriers: Closing this goal and opening in new ELS . Chronic Disease Management support, education, and care coordination needs related to HTN, HLD, and CKD Stage 3 . Pressure ulcer to right heel- working with wound clinic . Memory issues . Limited social support . Safety concerns . Knowledge deficit related to area on his foot that is not healing  Clinical Goal(s) related to HTN, HLD, and CKD Stage 3 :  Over the next 120 days, patient will:  . Work with the care management team to address educational, disease management, and care coordination needs  . Begin or continue self health monitoring activities as directed today Measure and record blood pressure 3 times per week and adhere to a heart healthy diet . Call provider office for new or worsened signs and symptoms Blood pressure findings outside established parameters, New or worsened symptom related to HLD/CKD, and new issues related to  non-healing area on his foot . Call care management team with questions or concerns . Verbalize basic understanding of patient centered plan of care established today  Interventions related to HTN, HLD, and CKD Stage 3 :  . Evaluation of current treatment plans and patient's adherence to plan as established by provider.  The patient is adherent to the plan of care. 06-24-2020: Incoming call from the wound clinic nurse [name] Had received an email about concerns that needed to be discussed from [name] from [facility].  The email was to [name] and [name] Had left a message with when calling the wound clinic and did receive a call back. The patient has been coming to the wound clinic for an stage 4/unstageable wound to right heel.  The patient has been coming 3 weeks. The wound clinic has significant concerns because the last 2 weeks when the patient had an appointment he went to the pcp office first before coming to the wound clinic because he was confused.  The patient also had a script for [place]. The medications was picked  up but the patient denied taking it, states he only had an empty bottle. The wound clinic did send the order again and the script was picked up. The patient told the staff at the wound clinic that he took 7 pills at one time. [name] expressed they are very concerned as the patients mental capacity appears to be impaired. The patient lost his keys for 5 days.  His wife comes with him to appointments but has confusion as well. A friend came with the patient today; however does not come with him every time. The friend was going to get a pill container and fix the patients medications for him.  The staff are concerned for the safety of the patient and reached out to the [facility] for assistance. Will collaborate with the CCM team and pcp on recommendations. The wound clinic has the [facility] information to call for updates accordingly.  . Assessed patient understanding of  disease states.  The patient verbalized understanding of his chronic conditions. The patient states that he is doing well. Taking his blood pressure every other day and it is good. Does state that he doesn't have as much energy and needs more energy. Discussed ways to pace activity and take rest breaks when needed.  . Assessed patient's education and care coordination needs-the patient has an appointment on 3-18 with podiatrist to evaluate his foot.  10-19-2019: The patient states he is doing well and has no further issues with his foot. The patient denies any concerns related to his foot or other health problems.  . Provided disease specific education to patient- education and support on a heart healthy diet.  The patient states he eats well. Denies any issues with current diet.  10-19-2019: The patient admits that he could do better at watching his sodium and fats. Denies issues with elevated blood pressure takes every other day and records at home. Reminded the patient to watch his sodium content.  [name] with appropriate clinical care team members regarding patient needs- the patient is currently working with [name]. Knows resources are available  Patient Self Care Activities related to HTN, HLD, and CKD Stage 3 :  . Patient is unable to independently self-manage chronic health conditions  Please see past updates related to this goal by clicking on the "Past Updates" button in the selected goal        Patient Care Plan: General Social Work (Adult)    Problem Identified: Quality of Life (General Plan of Care)     Long-Range Goal: Quality of Life Maintained   Start Date: 06/19/2020  Priority: Medium  Note:   Timeframe:  Long-Range Goal Priority:  Medium Start Date:   06/19/20                     Expected End Date: 08/19/20                    Follow Up Date- 07/23/20  Current Barrers:  . Limited social support . ADL IADL limitations . Social Isolation . Limited access to  caregiver . Inability to perform ADL's independently . Inability to perform IADL's independently . Lacks knowledge of community resource: personal care services  Clinical Social Work Clinical Goal(s):  Marland Kitchen Over the next 120 days, patient will work with SW to address concerns related to lack of care within the home  Interventions: . Patient and family interviewed and appropriate assessments performed . Update 06/25/20-[name] and his wife are close church friends  to patient for over 30 years and have been assisting him with his care. They live in [place] but have been staying at patient's house since this weekend. Jonathan's number is [phone]. [name] took patient to PCP office visit today on 06/25/20 and picked up his medications. Jonathan's wife is going to order patient some medical supplies which include a pill box for both morning and evenings. [name] was unaware of patient's urology appointment tomorrow and was appreciative of CCM [name]'s reminder. [name] and spouse will transport him there today on 06/26/20 at 1:30 pm. CCM [name] advised [name] to return CCM Pharmacist's call and he was agreeable to this plan.  Marland Kitchen CCM [name] was unable to reach patient or spouse but successfully spoke with his sister on 06/19/20. Sister reports that patient is doing better and that they go to church together each Sunday. She reports that she cares a lot for her brother and is there for him when needed but she is 84 years old and has her own health concerns.   . Provided patient and family with information about available personal care services within the area such as C.H.O.R.E program. Patient and family feel that he will not be eligible for Medicaid. Patient is now on wait list for C.H.O.R.E . Discussed plans with patient for ongoing care management follow up and provided patient with direct contact information for care management team . Advised patient to considering hiring a private pay caregiver as  Medicare does not cover these services . Assisted patient/caregiver with obtaining information about health plan benefits . Provided education and assistance to client regarding Advanced Directives. . Provided education to patient/caregiver regarding level of care options. . Provided education to patient/caregiver about Hospice and/or Palliative Care services . [name] will place patient on wait list for C.H.O.R.E program during next scheduled CCM appointment---Application completed and patient was successfully added on the wait list for C.H.O.R.E program on 08/08/19. Patient was provided this update. Patient was provided Home Care Providers contact information in the case that he wishes to inquire where he is at on the wait list. Sister was provided this information as well on 06/19/20  Patient Self Care Activities:  . Attends all scheduled provider appointments . Calls provider office for new concerns or questions . Lacks social connections . Unable to perform ADLs independently . Unable to perform IADLs independently  Please see past updates related to this goal by clicking on the "Past Updates" button in the selected goal    Task: Support and Maintain Acceptable Degree of Health, Comfort and Happiness   Note:   Care Management Activities:    - affirmation provided - community involvement promoted - expression of thoughts about present/future encouraged - independence in all possible areas promoted - life review by storytelling encouraged - patient strengths promoted - psychosocial concerns monitored - self-expression encouraged - sleep diary encouraged - sleep hygiene techniques encouraged - social relationships promoted - strategies to maintain hearing and/or vision promoted - strategies to maintain intimacy promoted - wellness behaviors promoted     Patient Care Plan: RNCM: Memory Loss    Problem Identified: RNCM: Memory Loss   Priority: High    Long-Range Goal: RNCM: Memory  Loss   Priority: High  Note:   Current Barriers:  Marland Kitchen Knowledge Deficits related to resources to assist with patient with changes in memory and being forgetful to take medications and care for self  . Chronic Disease Management support and education needs related to memory loss and changes  .  Lacks caregiver support.  . Non-adherence to scheduled provider appointments . Non-adherence to prescribed medication regimen . Unable to self administer medications as prescribed . Does not attend all scheduled provider appointments . Does not adhere to prescribed medication regimen . Lacks social connections . Unable to perform IADLs independently . Does not maintain contact with provider office . Does not contact provider office for questions/concerns  Nurse Case Manager Clinical Goal(s):  . patient will work with CCM team and pcp  to address needs related to memory loss and changes in chronic conditions  . patient will attend all scheduled medical appointments: 09-30-2020 . patient will demonstrate improved adherence to prescribed treatment plan for wound care and other chronic conditions  as evidenced bykeeping appointments, taking medications as prescribed, and working with the CCM team to optimize health and well being  . patient will work with CM team pharmacist to assist with medication needs and pill packaging.  . patient will work with CM clinical social worker to help with resources for help in the home and changes in memory  Interventions:  . 1:1 collaboration with Jonathan Hauser, DO regarding development and update of comprehensive plan of care as evidenced by provider attestation and co-signature . Inter-disciplinary care team collaboration (see longitudinal plan of care) . Evaluation of current treatment plan related to memory loss  and patient's adherence to plan as established by provider. . Advised patient to call the office for changes or questions  . Provided education to  patient re: CCM team and the role of the CCM team in helping the patient meet needs . Reviewed medications with patient and discussed compliance, the friend assisting the patient would like to get the pill packs for the patient. Pharmacist scheduled to call next week.  [name] with CCM team and pcp  regarding effective management of memory changes and support to the patient  . Reviewed scheduled/upcoming provider appointments including: 09-30-2020  . Social Work referral for memory changes and support . Pharmacy referral for medications management and pill packaging system  . Discussed plans with patient for ongoing care management follow up and provided patient with direct contact information for care management team  Patient Goals/Self-Care Activities Over the next 120 days, patient will:  - Patient will self administer medications as prescribed Patient will attend all scheduled provider appointments Patient will call pharmacy for medication refills Patient will attend church or other social activities Patient will continue to perform ADL's independently Patient will continue to perform IADL's independently Patient will call provider office for new concerns or questions Patient will work with BSW to address care coordination needs and will continue to work with the clinical team to address health care and disease management related needs.    - action plan for worsening symptoms mutually developed - community resource information provided - medication list reviewed - medication side effects managed - pain assessed - pain management plan developed - response to pharmacologic therapy monitored - symptom review completed Follow Up Plan: Telephone follow up appointment with care management team member scheduled for: 08-08-2020 at 345 pm       Task: RNCM: Develop Strategies to Manage Behavior   Note:   Care Management Activities:    - action plan for worsening symptoms mutually  developed - community resource information provided - medication list reviewed - medication side effects managed - pain assessed - pain management plan developed - response to pharmacologic therapy monitored - symptom review completed  Patient Care Plan: RNCM: Hypertension (Adult)    Problem Identified: RNCM: Hypertension (Hypertension)   Priority: Medium    Long-Range Goal: RNCM: Hypertension Monitored   Priority: Medium  Note:   Objective:  . Last practice recorded BP readings:  BP Readings from Last 3 Encounters:  06/26/20 (!) 160/74  06/25/20 140/84  06/13/20 (!) 151/78 .   Marland Kitchen Most recent eGFR/CrCl: No results found for: EGFR  No components found for: CRCL Current Barriers:  Marland Kitchen Knowledge Deficits related to basic understanding of hypertension pathophysiology and self care management . Knowledge Deficits related to understanding of medications prescribed for management of hypertension . Non-adherence to prescribed medication regimen . Non-adherence to scheduled provider appointments . Limited Social Support . Unable to independently manage HTN . Unable to self administer medications as prescribed . Does not attend all scheduled provider appointments . Does not adhere to prescribed medication regimen . Lacks social connections . Unable to perform IADLs independently . Does not maintain contact with provider office . Does not contact provider office for questions/concerns Case Manager Clinical Goal(s):  Marland Kitchen Over the next 120 days, patient will verbalize understanding of plan for hypertension management . Over the next 120 days, patient will attend all scheduled medical appointments: 09-30-2020 . Over the next 120 days, patient will demonstrate improved adherence to prescribed treatment plan for hypertension as evidenced by taking all medications as prescribed, monitoring and recording blood pressure as directed, adhering to low sodium/DASH diet . Over the next 120 days,  patient will demonstrate improved health management independence as evidenced by checking blood pressure as directed and notifying PCP if SBP>160 or DBP > 90, taking all medications as prescribe, and adhering to a low sodium diet as discussed. . Over the next 120 days, patient will verbalize basic understanding of hypertension disease process and self health management plan as evidenced by compliance with medications, compliance with dietary habits- heart healthy/ADA Diet, and working with the CCM team to meet health and wellness goals  Interventions:  . Collaboration with Jonathan Hauser, DO regarding development and update of comprehensive plan of care as evidenced by provider attestation and co-signature . Inter-disciplinary care team collaboration (see longitudinal plan of care) . Evaluation of current treatment plan related to hypertension self management and patient's adherence to plan as established by provider. . Provided education to patient re: stroke prevention, s/s of heart attack and stroke, DASH diet, complications of uncontrolled blood pressure . Reviewed medications with patient and discussed importance of compliance . Discussed plans with patient for ongoing care management follow up and provided patient with direct contact information for care management team . Advised patient, providing education and rationale, to monitor blood pressure daily and record, calling PCP for findings outside established parameters.  . Reviewed scheduled/upcoming provider appointments including: 09-30-2020 Patient Goals: - blood pressure trends reviewed - depression screen reviewed - home or ambulatory blood pressure monitoring encouraged Self-Care Activities: - Self administers medications as prescribed Attends all scheduled provider appointments Calls provider office for new concerns, questions, or BP outside discussed parameters Checks BP and records as discussed Follows a low sodium  diet/DASH diet Follow Up Plan: Telephone follow up appointment with care management team member scheduled for: 08-08-2020 at 345 pm   Task: RNCM: Identify and Monitor Blood Pressure Elevation   Note:   Care Management Activities:    - blood pressure trends reviewed - depression screen reviewed - home or ambulatory blood pressure monitoring encouraged       Patient Care  Plan: RNCM: HLD Management    Problem Identified: RNCM: Management of HLD   Priority: Medium    Long-Range Goal: RNCM: Management of HLD   Priority: Medium  Note:   Current Barriers:  . Poorly controlled hyperlipidemia, complicated by Memory changes and non-compliance with medications  . Current antihyperlipidemic regimen: Atorvastatin 40 mg QD  . Most recent lipid panel:     Component Value Date/Time   CHOL 163 06/13/2020 1113   TRIG 100 06/13/2020 1113   HDL 37 (L) 06/13/2020 1113   CHOLHDL 4.4 06/13/2020 1113   VLDL 21 07/20/2016 0001   LDLCALC 106 (H) 06/13/2020 1113 .   Marland Kitchen ASCVD risk enhancing conditions: age >44,  HTN, former smoker . Unable to independently manage chronic conditions due to memory impairment  . Unable to self administer medications as prescribed . Does not attend all scheduled provider appointments . Does not adhere to prescribed medication regimen . Lacks social connections . Unable to perform IADLs independently . Does not maintain contact with provider office . Does not contact provider office for questions/concerns RN Care Manager Clinical Goal(s):  . patient will work with Consulting civil engineer, providers, and care team towards execution of optimized self-health management plan . patient will verbalize understanding of plan for effective management of HLD  . patient will work with [facility], CCM team and pcp  to address needs related to effective management of HLD  . patient will attend all scheduled medical appointments: 09-30-2020  Interventions: . Collaboration with Jonathan Hauser, DO regarding development and update of comprehensive plan of care as evidenced by provider attestation and co-signature . Inter-disciplinary care team collaboration (see longitudinal plan of care) . Medication review performed; medication list updated in electronic medical record.  Bertram Savin care team collaboration (see longitudinal plan of care) . Referred to pharmacy team for assistance with HLD medication management . Evaluation of current treatment plan related to HLD  and patient's adherence to plan as established by provider. . Advised patient to call the office for changes in conditions or questions . Provided education to patient re: effective management of HLD  . Reviewed medications with patient and discussed compliance, the patient has not been taking medications as prescribed, his friend [name] has been helping him this week and they feel like they are back on track. Does want to talk to the pharmacist about pill packaging system.  [name] with CCM team and pcp  regarding changes in the patients condition and new needs the patient has.   . Reviewed scheduled/upcoming provider appointments including: Reviewed with [name] all upcoming appointments the patient has and next pcp appointment on 09-30-2020 . Discussed plans with patient for ongoing care management follow up and provided patient with direct contact information for care management team Patient Goals/Self-Care Activities: - call for medicine refill 2 or 3 days before it runs out - call if I am sick and can't take my medicine - keep a list of all the medicines I take; vitamins and herbals too - learn to read medicine labels - use a pillbox to sort medicine - use an alarm clock or phone to remind me to take my medicine - change to whole grain breads, cereal, pasta - drink 6 to 8 glasses of water each day - eat 3 to 5 servings of fruits and vegetables each day - eat 5 or 6 small meals each  day - fill half the plate with nonstarchy vegetables - limit fast food meals to  no more than 1 per week - manage portion size - prepare main meal at home 3 to 5 days each week - read food labels for fat, fiber, carbohydrates and portion size - be open to making changes - I can manage, know and watch for signs of a heart attack - if I have chest pain, call for help - learn about small changes that will make a big difference - learn my personal risk factors  Follow Up Plan: Telephone follow up appointment with care management team member scheduled for: 08-08-2020 at 345 pm     Task: RNCM: Management of HLD   Note:   Care Management Activities:    - barriers to meeting goals identified - change-talk evoked - choices provided - collaboration with team encouraged - decision-making supported - difficulty of making life-long changes acknowledged - health risks reviewed - problem-solving facilitated - questions answered - readiness for change evaluated - reassurance provided - resources needed to meet goals identified - self-reflection promoted - self-reliance encouraged       Patient Care Plan: RNCM: Management of wound to right heel    Problem Identified: RNCM: Management of wound to right heel   Priority: High    Long-Range Goal: RNCM: Management of Right heel wound   Priority: High  Note:   Current Barriers:  Marland Kitchen Knowledge Deficits related to resources to help in the management of chronic condtions . Chronic Disease Management support and education needs related to wound to right heel . Lacks caregiver support.  . Non-adherence to scheduled provider appointments . Non-adherence to prescribed medication regimen . Unable to independently manage wound to right heel  . Unable to self administer medications as prescribed . Does not attend all scheduled provider appointments . Does not adhere to prescribed medication regimen . Lacks social connections . Unable to perform  IADLs independently . Does not maintain contact with provider office . Does not contact provider office for questions/concerns  Nurse Case Manager Clinical Goal(s):  . patient will verbalize understanding of plan for effective management of wound to right heel  . patient will work with [facility] team, pcp and specialist  to address needs related to wound to right heel  . patient will attend all scheduled medical appointments: 09-30-2020 and wound center appointments  . patient will demonstrate improved adherence to prescribed treatment plan for wound to right heel as evidenced bykeeping appointments, taking medications as prescribed, healing wound, and working with the CCM team to effectively manage health and well being   Interventions:  . 1:1 collaboration with Jonathan Hauser, DO regarding development and update of comprehensive plan of care as evidenced by provider attestation and co-signature . Inter-disciplinary care team collaboration (see longitudinal plan of care) . Evaluation of current treatment plan related to wound care to right heel  and patient's adherence to plan as established by provider. . Advised patient to call for new concerns or questions  . Provided education to patient re: keeping appointments with the wound clinic and following recommendations by the specialist for effective healing of right heel wound  . Collaborated with RNCM, CCM team, pcp and specialist  regarding care of right heel wound  . Reviewed scheduled/upcoming provider appointments including: 09-30-2020, also reviewed upcoming appointments to the wound center with [name], friend for the patient.  . Discussed plans with patient for ongoing care management follow up and provided patient with direct contact information for care management team  Patient Goals/Self-Care Activities Over the next 120  days, patient will:  - Patient will self administer medications as prescribed Patient will attend  all scheduled provider appointments Patient will call pharmacy for medication refills Patient will attend church or other social activities Patient will continue to perform ADL's independently Patient will continue to perform IADL's independently Patient will call provider office for new concerns or questions Patient will work with BSW to address care coordination needs and will continue to work with the clinical team to address health care and disease management related needs.    - barriers to meeting goals identified - change-talk evoked - choices provided - collaboration with team encouraged - decision-making supported - health risks reviewed - problem-solving facilitated - questions answered - readiness for change evaluated - reassurance provided - resources needed to meet goals identified - self-reflection promoted - self-reliance encouraged Follow Up Plan: Telephone follow up appointment with care management team member scheduled for: 08-08-2020 at 345 pm       Task: RNCM: Wound to Right heel   Note:   Care Management Activities:    - barriers to meeting goals identified - change-talk evoked - choices provided - collaboration with team encouraged - decision-making supported - health risks reviewed - problem-solving facilitated - questions answered - readiness for change evaluated - reassurance provided - resources needed to meet goals identified - self-reflection promoted - self-reliance encouraged       The patient verbalized understanding of instructions, educational materials, and care plan provided today and declined offer to receive copy of patient instructions, educational materials, and care plan.   Telephone follow up appointment with care management team member scheduled for: 08-08-2020 at 345pm  [place], MSN, [place] Mobile: [phone]

## 2020-07-01 ENCOUNTER — Encounter: Payer: Medicare Other | Admitting: Physician Assistant

## 2020-07-01 ENCOUNTER — Other Ambulatory Visit: Payer: Self-pay

## 2020-07-01 DIAGNOSIS — R296 Repeated falls: Secondary | ICD-10-CM | POA: Diagnosis not present

## 2020-07-01 DIAGNOSIS — Z7901 Long term (current) use of anticoagulants: Secondary | ICD-10-CM | POA: Diagnosis not present

## 2020-07-01 DIAGNOSIS — L89890 Pressure ulcer of other site, unstageable: Secondary | ICD-10-CM | POA: Diagnosis not present

## 2020-07-01 DIAGNOSIS — L89610 Pressure ulcer of right heel, unstageable: Secondary | ICD-10-CM | POA: Diagnosis not present

## 2020-07-01 NOTE — Progress Notes (Addendum)
[name], [name] ([id number]) Visit Report for 07/01/2020 Chief Complaint Document Details [name], [name] Date of Service: 07/01/2020 10:30 AM Medical Record Number: [id number] Patient Account Number: [id number] Date of Birth/Sex: Jan 03, 1935 (86 y.o. M) Treating RN: Carlene Coria Primary Care Provider: Nobie Putnam Other Clinician: Jeanine Luz Referring Provider: Nobie Putnam Treating Provider/Extender: Skipper Cliche in Treatment: 4 Information Obtained from: Patient Chief Complaint Right heel ulcer Electronic Signature(s) Signed: 07/01/2020 10:48:07 AM By: Worthy Keeler PA-C Entered By: Worthy Keeler on 07/01/2020 10:48:07 [name], [name] ([id number]) -------------------------------------------------------------------------------- HPI Details [name] Date of Service: 07/01/2020 10:30 AM Medical Record Number: [id number] Patient Account Number: [id number] Date of Birth/Sex: 1935-02-26 (86 y.o. M) Treating RN: Carlene Coria Primary Care Provider: Nobie Putnam Other Clinician: Jeanine Luz Referring Provider: Nobie Putnam Treating Provider/Extender: Skipper Cliche in Treatment: 4 History of Present Illness HPI Description: 06/03/2020 upon evaluation today patient actually presents for initial inspection here in our clinic after having gone to urgent care this morning where they were concerned about infection as well as a significant "softball sized wound with eschar" over the heel. With that being said the patient fortunately does not have an area that largely does have an area of eschar which is draining around the edges really not stable and I think we are getting to remove the eschar so we try to clean up the surface of the wound. Fortunately there is no evidence of active infection at this time. I do believe that he is going require some debridement both manually today with scissors and forceps as well  as enzymatic/chemically with the medications. I really feel like that he has a good chance to get this to heal if he keep pressure off of it we did discuss that again today. He does note this has been present for about 2 months. He is not taking any of his current medications that we did put him on the list he tells me that he only occasionally takes them which is obviously not doing anything for him. He does have a cane but he does not use it and his ABI was 1.14 on the right. 06/10/2020 upon evaluation today patient appears to be doing well with regard to his wound all things considered. He still has a lot of necrotic tissue in the central portion of the wound. I think the Iodosorb is still the right thing to do and again the biggest issue is he quit using it as he and his wife felt like that the drainage following putting on the medicine was really "nasty". Nonetheless I explained that the weight Iodoflex breaks down or Iodosorb and that round does make it look really bad but it actually is not as bad as what it seems. In fact it is the [name] course of things. 06/24/2020 upon evaluation today patient appears to be doing unfortunately about the same in regard to his heel. I do not think this is any worse. However its become increasingly clear that he has significant dementia in my opinion. We have had multiple instances going on here from the standpoint of trying to get him to take the oral antibiotic. Initially we ordered it for him he picked it up on 1 afternoon and by the next morning we called to make sure that he had gotten it he told us yes but that he had thrown it away. That he was not to take any additional medications. We then told him he needed  to be taken this to get the infection under control he subsequently look for it only found the bottle never took the medication therefore I sent it in a second time for him. After sending this and it looks like he has not really taken it since that  time he tells me that he took 7 pills at once although I do not think they were all of the same medication and then following this it made him sick I think these were all of his different medicines. He decided he was not to take any further medication after that point. Nonetheless I do believe he still is going require treatment for this infection the good news is he has a friend who is a very good friend that is with him here today to try to help with figuring out what to do and how going forward this should be taken care of. 07/01/2020 upon evaluation today patient appears to be doing really about the same. His measurements are maybe slightly smaller than previous. Fortunately there is no signs of active infection which is great news. No fevers, chills, nausea, vomiting, or diarrhea. Electronic Signature(s) Signed: 07/01/2020 11:31:34 AM By: Worthy Keeler PA-C Entered By: Worthy Keeler on 07/01/2020 11:31:34 [name], [name] ([id number]) -------------------------------------------------------------------------------- Physical Exam Details [name] Date of Service: 07/01/2020 10:30 AM Medical Record Number: [id number] Patient Account Number: [id number] Date of Birth/Sex: Feb 08, 1935 (86 y.o. M) Treating RN: Carlene Coria Primary Care Provider: Nobie Putnam Other Clinician: Jeanine Luz Referring Provider: Nobie Putnam Treating Provider/Extender: Skipper Cliche in Treatment: 4 Constitutional Well-nourished and well-hydrated in no acute distress. Respiratory [name] breathing without difficulty. Psychiatric this patient is able to make decisions and demonstrates good insight into disease process. Alert and Oriented x 3. pleasant and cooperative. Notes Upon inspection patient's wound bed showed signs of good granulation epithelization at this point. There does not appear to be any evidence of infection which is great news and overall very pleased  with where things stand today. No fevers, chills, nausea, vomiting, or diarrhea. Electronic Signature(s) Signed: 07/01/2020 11:32:52 AM By: Worthy Keeler PA-C Entered By: Worthy Keeler on 07/01/2020 11:32:52 [name], [name] ([id number]) -------------------------------------------------------------------------------- Physician Orders Details [name] Date of Service: 07/01/2020 10:30 AM Medical Record Number: [id number] Patient Account Number: [id number] Date of Birth/Sex: May 24, 1934 (86 y.o. M) Treating RN: Carlene Coria Primary Care Provider: Nobie Putnam Other Clinician: Jeanine Luz Referring Provider: Nobie Putnam Treating Provider/Extender: Skipper Cliche in Treatment: 4 Verbal / Phone Orders: No Diagnosis Coding ICD-10 Coding Code Description L89.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, unstageable R29.6 Repeated falls Z79.01 Long term (current) use of anticoagulants Follow-up Appointments o Return Appointment in 1 week. Bathing/ Shower/ Hygiene o May shower; gently cleanse wound with antibacterial soap, rinse and pat dry prior to dressing wounds Edema Control - Lymphedema / Segmental Compressive Device / Other o Elevate, Exercise Daily and Avoid Standing for Long Periods of Time. o Elevate legs to the level of the heart and pump ankles as often as possible o Elevate leg(s) parallel to the floor when sitting. Wound Treatment Wound #1 - Foot Wound Laterality: Right, Lateral Cleanser: Byram Ancillary Kit - 15 Day Supply (Generic) 3 x Per Week/30 Days Discharge Instructions: Use supplies as instructed; Kit contains: (15) Saline Bullets; (15) 3x3 Gauze; 15 pr Gloves Cleanser: [name] Saline (Generic) 3 x Per Week/30 Days Discharge Instructions: Wash your hands with soap and water. Remove old dressing, discard into  plastic bag and place into trash. Cleanse the wound with [name] Saline prior to applying a clean dressing using gauze  sponges, not tissues or cotton balls. Do not scrub or use excessive force. Pat dry using gauze sponges, not tissue or cotton balls. Primary Dressing: IODOFLEX 0.9% Cadexomer Iodine Pad (Generic) 3 x Per Week/30 Days Discharge Instructions: Apply Iodoflex to wound bed only as directed. Secondary Dressing: ABD Pad 5x9 (in/in) (Generic) 3 x Per Week/30 Days Discharge Instructions: Cover with ABD pad Secondary Dressing: Kerlix 4.5 x 4.1 (in/yd) (Generic) 3 x Per Week/30 Days Discharge Instructions: Apply Kerlix 4.5 x 4.1 (in/yd) as instructed Secured With: Tubigrip Size E, 3.5x10 (in/yds) (Generic) 3 x Per Week/30 Days Discharge Instructions: Apply 3 Tubigrip E 3-finger-widths below knee to base of toes to secure dressing and/or for swelling. Electronic Signature(s) Signed: 07/01/2020 4:25:37 PM By: Worthy Keeler PA-C Signed: 07/02/2020 4:27:39 PM By: Carlene Coria RN Entered By: Carlene Coria on 07/01/2020 11:27:48 [name], [name] ([id number]) -------------------------------------------------------------------------------- Problem List Details [name] Date of Service: 07/01/2020 10:30 AM Medical Record Number: [id number] Patient Account Number: [id number] Date of Birth/Sex: 01/26/1935 (86 y.o. M) Treating RN: Carlene Coria Primary Care Provider: Nobie Putnam Other Clinician: Jeanine Luz Referring Provider: Nobie Putnam Treating Provider/Extender: Skipper Cliche in Treatment: 4 Active Problems ICD-10 Encounter Code Description Active Date MDM Diagnosis L89.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, unstageable 06/03/2020 No Yes R29.6 Repeated falls 06/03/2020 No Yes Z79.01 Long term (current) use of anticoagulants 06/03/2020 No Yes Inactive Problems Resolved Problems Electronic Signature(s) Signed: 07/01/2020 10:47:58 AM By: Worthy Keeler PA-C Entered By: Worthy Keeler on 07/01/2020 10:47:58 [name], [name]  ([id number]) -------------------------------------------------------------------------------- Progress Note Details [name] Date of Service: 07/01/2020 10:30 AM Medical Record Number: [id number] Patient Account Number: [id number] Date of Birth/Sex: May 26, 1934 (86 y.o. M) Treating RN: Carlene Coria Primary Care Provider: Nobie Putnam Other Clinician: Jeanine Luz Referring Provider: Nobie Putnam Treating Provider/Extender: Skipper Cliche in Treatment: 4 Subjective Chief Complaint Information obtained from Patient Right heel ulcer History of Present Illness (HPI) 06/03/2020 upon evaluation today patient actually presents for initial inspection here in our clinic after having gone to urgent care this morning where they were concerned about infection as well as a significant "softball sized wound with eschar" over the heel. With that being said the patient fortunately does not have an area that largely does have an area of eschar which is draining around the edges really not stable and I think we are getting to remove the eschar so we try to clean up the surface of the wound. Fortunately there is no evidence of active infection at this time. I do believe that he is going require some debridement both manually today with scissors and forceps as well as enzymatic/chemically with the medications. I really feel like that he has a good chance to get this to heal if he keep pressure off of it we did discuss that again today. He does note this has been present for about 2 months. He is not taking any of his current medications that we did put him on the list he tells me that he only occasionally takes them which is obviously not doing anything for him. He does have a cane but he does not use it and his ABI was 1.14 on the right. 06/10/2020 upon evaluation today patient appears to be doing well with regard to his wound all things considered. He still has a lot  of necrotic tissue in the central portion of the wound. I think the Iodosorb is still the right thing to do and again the biggest issue is he quit using it as he and his wife felt like that the drainage following putting on the medicine was really "nasty". Nonetheless I explained that the weight Iodoflex breaks down or Iodosorb and that round does make it look really bad but it actually is not as bad as what it seems. In fact it is the [name] course of things. 06/24/2020 upon evaluation today patient appears to be doing unfortunately about the same in regard to his heel. I do not think this is any worse. However its become increasingly clear that he has significant dementia in my opinion. We have had multiple instances going on here from the standpoint of trying to get him to take the oral antibiotic. Initially we ordered it for him he picked it up on 1 afternoon and by the next morning we called to make sure that he had gotten it he told us yes but that he had thrown it away. That he was not to take any additional medications. We then told him he needed to be taken this to get the infection under control he subsequently look for it only found the bottle never took the medication therefore I sent it in a second time for him. After sending this and it looks like he has not really taken it since that time he tells me that he took 7 pills at once although I do not think they were all of the same medication and then following this it made him sick I think these were all of his different medicines. He decided he was not to take any further medication after that point. Nonetheless I do believe he still is going require treatment for this infection the good news is he has a friend who is a very good friend that is with him here today to try to help with figuring out what to do and how going forward this should be taken care of. 07/01/2020 upon evaluation today patient appears to be doing really about the same.  His measurements are maybe slightly smaller than previous. Fortunately there is no signs of active infection which is great news. No fevers, chills, nausea, vomiting, or diarrhea. Objective Constitutional Well-nourished and well-hydrated in no acute distress. Vitals Time Taken: 11:02 AM, Height: 71 in, Weight: 216 lbs, BMI: 30.1, Temperature: 97.9 F, Pulse: 74 bpm, Respiratory Rate: 16 breaths/min, Blood Pressure: 144/83 mmHg. Respiratory [name] breathing without difficulty. Psychiatric this patient is able to make decisions and demonstrates good insight into disease process. Alert and Oriented x 3. pleasant and cooperative. General Notes: Upon inspection patient's wound bed showed signs of good granulation epithelization at this point. There does not appear to be any evidence of infection which is great news and overall very pleased with where things stand today. No fevers, chills, nausea, vomiting, or diarrhea. [name], [name] ([id number]) Integumentary (Hair, Skin) Wound #1 status is Open. Original cause of wound was Pressure Injury. The date acquired was: 04/03/2020. The wound has been in treatment 4 weeks. The wound is located on the Right,Lateral Foot. The wound measures 5cm length x 4.5cm width x 0.4cm depth; 17.671cm^2 area and 7.069cm^3 volume. There is Fat Layer (Subcutaneous Tissue) exposed. There is no tunneling or undermining noted. There is a medium amount of serosanguineous drainage noted. Foul odor after cleansing was noted. The wound margin is thickened. There is  small (1-33%) red, pink, hyper - granulation within the wound bed. There is a large (67-100%) amount of necrotic tissue within the wound bed including Eschar and Adherent Slough. Assessment Active Problems ICD-10 Pressure ulcer of right heel, unstageable Repeated falls Long term (current) use of anticoagulants Plan Follow-up Appointments: Return Appointment in 1 week. Bathing/ Shower/ Hygiene: May shower;  gently cleanse wound with antibacterial soap, rinse and pat dry prior to dressing wounds Edema Control - Lymphedema / Segmental Compressive Device / Other: Elevate, Exercise Daily and Avoid Standing for Long Periods of Time. Elevate legs to the level of the heart and pump ankles as often as possible Elevate leg(s) parallel to the floor when sitting. WOUND #1: - Foot Wound Laterality: Right, Lateral Cleanser: Byram Ancillary Kit - 15 Day Supply (Generic) 3 x Per Week/30 Days Discharge Instructions: Use supplies as instructed; Kit contains: (15) Saline Bullets; (15) 3x3 Gauze; 15 pr Gloves Cleanser: [name] Saline (Generic) 3 x Per Week/30 Days Discharge Instructions: Wash your hands with soap and water. Remove old dressing, discard into plastic bag and place into trash. Cleanse the wound with [name] Saline prior to applying a clean dressing using gauze sponges, not tissues or cotton balls. Do not scrub or use excessive force. Pat dry using gauze sponges, not tissue or cotton balls. Primary Dressing: IODOFLEX 0.9% Cadexomer Iodine Pad (Generic) 3 x Per Week/30 Days Discharge Instructions: Apply Iodoflex to wound bed only as directed. Secondary Dressing: ABD Pad 5x9 (in/in) (Generic) 3 x Per Week/30 Days Discharge Instructions: Cover with ABD pad Secondary Dressing: Kerlix 4.5 x 4.1 (in/yd) (Generic) 3 x Per Week/30 Days Discharge Instructions: Apply Kerlix 4.5 x 4.1 (in/yd) as instructed Secured With: Tubigrip Size E, 3.5x10 (in/yds) (Generic) 3 x Per Week/30 Days Discharge Instructions: Apply 3 Tubigrip E 3-finger-widths below knee to base of toes to secure dressing and/or for swelling. 1. I am going to recommend currently that we go ahead and continue with the Iodoflex. I do think the patient needs to change this more frequently. For that reason I am recommending a Wednesday and Friday change for him to do at home. Subsequently will be changed on Mondays here. 2. I am also can recommend that  he continue to finish off the antibiotics to completion. I think he may still have some of that left. 3. I am also can recommend the patient continue to monitor for any signs of worsening infection. Office if anything occurs they should let me know soon as possible. We will see patient back for reevaluation in 1 week here in the clinic. If anything worsens or changes patient will contact our office for additional recommendations. Electronic Signature(s) Signed: 07/01/2020 11:33:18 AM By: Worthy Keeler PA-C Entered By: Worthy Keeler on 07/01/2020 11:33:17 [name], [name] ([id number]) -------------------------------------------------------------------------------- SuperBill Details [name] Date of Service: 07/01/2020 Medical Record Number: [id number] Patient Account Number: [id number] Date of Birth/Sex: Feb 09, 1935 (86 y.o. M) Treating RN: Carlene Coria Primary Care Provider: Nobie Putnam Other Clinician: Jeanine Luz Referring Provider: Nobie Putnam Treating Provider/Extender: Skipper Cliche in Treatment: 4 Diagnosis Coding ICD-10 Codes Code Description L89.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, unstageable R29.6 Repeated falls Z79.01 Long term (current) use of anticoagulants Facility Procedures CPT4 Code: [id number] Description: 99213 - WOUND CARE VISIT-LEV 3 EST PT Modifier: Quantity: 1 Physician Procedures CPT4 Code: [id number] Description: 35465 - WC PHYS LEVEL 3 - EST PT Modifier: Quantity: 1 CPT4 Code: Description: ICD-10 Diagnosis Description L89.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, unstageable  R29.6 Repeated falls Z79.01 Long term (current) use of anticoagulants Modifier: Quantity: Electronic Signature(s) Signed: 07/01/2020 4:25:37 PM By: Worthy Keeler PA-C Signed: 07/02/2020 4:27:39 PM By: Carlene Coria RN Previous Signature: 07/01/2020 11:33:47 AM Version By: Worthy Keeler PA-C Entered By: Carlene Coria on 07/01/2020 11:34:51

## 2020-07-01 NOTE — Progress Notes (Addendum)
[name], [name] ([id number]) Visit Report for 07/01/2020 Arrival Information Details [name], [name] Date of Service: 07/01/2020 10:30 AM Medical Record Number: [id number] Patient Account Number: [id number] Date of Birth/Sex: 12-19-1934 (85 y.o. M) Treating RN: Carlene Coria Primary Care Rolondo Pierre: Nobie Putnam Other Clinician: Jeanine Luz Referring Remijio Holleran: Nobie Putnam Treating Thijs Brunton/Extender: Skipper Cliche in Treatment: 4 Visit Information History Since Last Visit Added or deleted any medications: No Patient Arrived: Ambulatory Had a fall or experienced change in No Arrival Time: 11:01 activities of daily living that may affect Accompanied By: self risk of falls: Transfer Assistance: None Hospitalized since last visit: No Patient Identification Verified: Yes Pain Present Now: No Secondary Verification Process Completed: Yes Patient Has Alerts: Yes Patient Alerts: Not Diabetic Plavix Electronic Signature(s) Signed: 07/01/2020 11:47:12 AM By: Jeanine Luz Entered By: Jeanine Luz on 07/01/2020 11:02:26 [name], [name] -------------------------------------------------------------------------------- Clinic Level of Care Assessment Details [name] Date of Service: 07/01/2020 10:30 AM Medical Record Number: [id number] Patient Account Number: [id number] Date of Birth/Sex: 06/12/1934 (85 y.o. M) Treating RN: Carlene Coria Primary Care Broady Lafoy: Nobie Putnam Other Clinician: Jeanine Luz Referring Jazzie Trampe: Nobie Putnam Treating Yazaira Speas/Extender: Skipper Cliche in Treatment: 4 Clinic Level of Care Assessment Items TOOL 4 Quantity Score X - Use when only an EandM is performed on FOLLOW-UP visit 1 0 ASSESSMENTS - Nursing Assessment / Reassessment X - Reassessment of Co-morbidities (includes updates in patient status) 1 10 X- 1 5 Reassessment of Adherence to Treatment  Plan ASSESSMENTS - Wound and Skin Assessment / Reassessment X - Simple Wound Assessment / Reassessment - one wound 1 5 _0  - 0 Complex Wound Assessment / Reassessment - multiple wounds _1  - 0 Dermatologic / Skin Assessment (not related to wound area) ASSESSMENTS - Focused Assessment _2  - Circumferential Edema Measurements - multi extremities 0 _3  - 0 Nutritional Assessment / Counseling / Intervention _4  - 0 Lower Extremity Assessment (monofilament, tuning fork, pulses) _5  - 0 Peripheral Arterial Disease Assessment (using hand held doppler) ASSESSMENTS - Ostomy and/or Continence Assessment and Care _6  - Incontinence Assessment and Management 0 _7  - 0 Ostomy Care Assessment and Management (repouching, etc.) PROCESS - Coordination of Care X - Simple Patient / Family Education for ongoing care 1 15 _8  - 0 Complex (extensive) Patient / Family Education for ongoing care _9  - 0 Staff obtains Programmer, systems, Records, Test Results / Process Orders _10  - 0 Staff telephones HHA, Nursing Homes / Clarify orders / etc _11  - 0 Routine Transfer to another Facility (non-emergent condition) _12  - 0 Routine Hospital Admission (non-emergent condition) _13  - 0 New Admissions / Biomedical engineer / Ordering NPWT, Apligraf, etc. _14  - 0 Emergency Hospital Admission (emergent condition) X- 1 10 Simple Discharge Coordination _15  - 0 Complex (extensive) Discharge Coordination PROCESS - Special Needs _16  - Pediatric / Minor Patient Management 0 _17  - 0 Isolation Patient Management _18  - 0 Hearing / Language / Visual special needs _19  - 0 Assessment of Community assistance (transportation, D/C planning, etc.) _20  - 0 Additional assistance / Altered mentation _21  - 0 Support Surface(s) Assessment (bed, cushion, seat, etc.) INTERVENTIONS - Wound Cleansing / Measurement [name], [name] X- 1 5 Simple Wound Cleansing - one wound _22  - 0 Complex Wound Cleansing - multiple wounds X- 1 5 Wound  Imaging (photographs - any number of wounds) _23  - 0 Wound Tracing (instead of photographs) X- 1 5 Simple Wound Measurement - one wound _24  - 0 Complex Wound Measurement - multiple  wounds INTERVENTIONS - Wound Dressings X - Small Wound Dressing one or multiple wounds 1 10 _0  - 0 Medium Wound Dressing one or multiple wounds _1  - 0 Large Wound Dressing one or multiple wounds X- 1 5 Application of Medications - topical <NWGNFAOZHYQMVHQI>_6<\/[id number]>_2  - 0 Application of Medications - injection INTERVENTIONS - Miscellaneous _3  - External ear exam 0 _4  - 0 Specimen Collection (cultures, biopsies, blood, body fluids, etc.) _5  - 0 Specimen(s) / Culture(s) sent or taken to Lab for analysis _6  - 0 Patient Transfer (multiple staff / Harrel Lemon Lift / Similar devices) _7  - 0 Simple Staple / Suture removal (25 or less) _8  - 0 Complex Staple / Suture removal (26 or more) _9  - 0 Hypo / Hyperglycemic Management (close monitor of Blood Glucose) _10  - 0 Ankle / Brachial Index (ABI) - do not check if billed separately X- 1 5 Vital Signs Has the patient been seen at the hospital within the last three years: Yes Total Score: 80 Level Of Care: New/Established - Level 3 Electronic Signature(s) Signed: 07/02/2020 4:27:39 PM By: Carlene Coria RN Entered By: Carlene Coria on 07/01/2020 11:34:41 Polus, [name] ([id number]) -------------------------------------------------------------------------------- Encounter Discharge Information Details [name] Date of Service: 07/01/2020 10:30 AM Medical Record Number: [id number] Patient Account Number: [id number] Date of Birth/Sex: [birth year] (85 y.o. M) Treating RN: Carlene Coria Primary Care Harkirat Orozco: Nobie Putnam Other Clinician: Jeanine Luz Referring Arye Weyenberg: Nobie Putnam Treating Yarisa Lynam/Extender: Skipper Cliche in Treatment: 4 Encounter Discharge Information Items Discharge Condition: Stable Ambulatory Status: Ambulatory Discharge  Destination: Home Transportation: Private Auto Accompanied By: self Schedule Follow-up Appointment: Yes Clinical Summary of Care: Patient Declined Electronic Signature(s) Signed: 07/02/2020 4:27:39 PM By: Carlene Coria RN Entered By: Carlene Coria on 07/01/2020 11:35:42 Dupre, [name] ([id number]) -------------------------------------------------------------------------------- Lower Extremity Assessment Details [name] Date of Service: 07/01/2020 10:30 AM Medical Record Number: [id number] Patient Account Number: [id number] Date of Birth/Sex: 06/05/1934 (85 y.o. M) Treating RN: Carlene Coria Primary Care Carnisha Feltz: Nobie Putnam Other Clinician: Jeanine Luz Referring Shalondra Wunschel: Nobie Putnam Treating Shyonna Carlin/Extender: Skipper Cliche in Treatment: 4 Edema Assessment Assessed: [Left: No] Patrice Paradise: Yes] Edema: [Left: Ye] [Right: s] Calf Left: Right: Point of Measurement: 31 cm From Medial Instep 37.3 cm Ankle Left: Right: Point of Measurement: 12 cm From Medial Instep 26.5 cm Electronic Signature(s) Signed: 07/01/2020 11:47:12 AM By: Jeanine Luz Signed: 07/02/2020 4:27:39 PM By: Carlene Coria RN Entered By: Jeanine Luz on 07/01/2020 11:13:41 Rands, Jonathan D. ([id number]) -------------------------------------------------------------------------------- Multi Wound Chart Details Patient Name: [name] Date of Service: 07/01/2020 10:30 AM Medical Record Number: [id number] Patient Account Number: [id number] Date of Birth/Sex: 05-17-1934 (85 y.o. M) Treating RN: Carlene Coria Primary Care Erron Wengert: Nobie Putnam Other Clinician: Jeanine Luz Referring Brookelin Felber: Nobie Putnam Treating Erasmus Bistline/Extender: Skipper Cliche in Treatment: 4 Vital Signs Height(in): 71 Pulse(bpm): 74 Weight(lbs): 216 Blood Pressure(mmHg): 144/83 Body Mass Index(BMI): 30 Temperature(F): 97.9 Respiratory Rate(breaths/min): 16 Photos:  [N/A:N/A] Wound Location: Right, Lateral Foot N/A N/A Wounding Event: Pressure Injury N/A N/A Primary Etiology: Pressure Ulcer N/A N/A Comorbid History: Cataracts, Hypertension N/A N/A Date Acquired: 04/03/2020 N/A N/A Weeks of Treatment: 4 N/A N/A Wound Status: Open N/A N/A Measurements L x W x D (cm) 5x4.5x0.4 N/A N/A Area (cm) : 17.671 N/A N/A Volume (cm) : 7.069 N/A N/A % Reduction in Area: 21.20% N/A N/A % Reduction in Volume: 37.00% N/A N/A Classification: Unstageable/Unclassified N/A N/A Exudate Amount: Medium N/A N/A Exudate Type: Serosanguineous N/A N/A Exudate Color: red, brown  N/A N/A Foul Odor After Cleansing: Yes N/A N/A Odor Anticipated Due to Product No N/A N/A Use: Wound Margin: Thickened N/A N/A Granulation Amount: Small (1-33%) N/A N/A Granulation Quality: Red, Pink, Hyper-granulation N/A N/A Necrotic Amount: Large (67-100%) N/A N/A Necrotic Tissue: Eschar, Adherent Slough N/A N/A Exposed Structures: Fat Layer (Subcutaneous Tissue): N/A N/A Yes Fascia: No Tendon: No Muscle: No Joint: No Bone: No Epithelialization: None N/A N/A Treatment Notes Electronic Signature(s) Signed: 07/02/2020 4:27:39 PM By: Carlene Coria RN Entered By: Carlene Coria on 07/01/2020 11:26:53 [name], [name] ([id number]) [name], [name] ([id number]) -------------------------------------------------------------------------------- [place] Details [name] Date of Service: 07/01/2020 10:30 AM Medical Record Number: [id number] Patient Account Number: [id number] Date of Birth/Sex: 1935/03/24 (85 y.o. M) Treating RN: Carlene Coria Primary Care Yun Gutierrez: Nobie Putnam Other Clinician: Jeanine Luz Referring Kiernan Farkas: Nobie Putnam Treating Marykathleen Russi/Extender: Skipper Cliche in Treatment: 4 Active Inactive Wound/Skin Impairment Nursing Diagnoses: Knowledge deficit related to ulceration/compromised skin  integrity Goals: Patient/caregiver will verbalize understanding of skin care regimen Date Initiated: 06/03/2020 Target Resolution Date: 08/01/2020 Goal Status: Active Ulcer/skin breakdown will have a volume reduction of 30% by week 4 Date Initiated: 06/03/2020 Date Inactivated: 07/01/2020 Target Resolution Date: 07/01/2020 Goal Status: Met Ulcer/skin breakdown will have a volume reduction of 50% by week 8 Date Initiated: 07/01/2020 Target Resolution Date: 08/01/2020 Goal Status: Active Ulcer/skin breakdown will have a volume reduction of 80% by week 12 Date Initiated: 07/01/2020 Target Resolution Date: 08/31/2020 Goal Status: Active Ulcer/skin breakdown will heal within 14 weeks Date Initiated: 07/01/2020 Target Resolution Date: 10/01/2020 Goal Status: Active Interventions: Assess patient/caregiver ability to obtain necessary supplies Assess patient/caregiver ability to perform ulcer/skin care regimen upon admission and as needed Assess ulceration(s) every visit Notes: Electronic Signature(s) Signed: 07/02/2020 4:27:39 PM By: Carlene Coria RN Entered By: Carlene Coria on 07/01/2020 11:26:42 Swenson, [name] ([id number]) -------------------------------------------------------------------------------- Pain Assessment Details [name] Date of Service: 07/01/2020 10:30 AM Medical Record Number: [id number] Patient Account Number: [id number] Date of Birth/Sex: [birth year] (85 y.o. M) Treating RN: Carlene Coria Primary Care Jaykwon Morones: Nobie Putnam Other Clinician: Jeanine Luz Referring Laurena Valko: Nobie Putnam Treating Mirela Parsley/Extender: Skipper Cliche in Treatment: 4 Active Problems Location of Pain Severity and Description of Pain Patient Has Paino No Site Locations Pain Management and Medication Current Pain Management: Electronic Signature(s) Signed: 07/01/2020 11:47:12 AM By: Jeanine Luz Signed: 07/02/2020 4:27:39 PM By: Carlene Coria  RN Entered By: Jeanine Luz on 07/01/2020 11:04:42 Sullenberger, [name] ([id number]) -------------------------------------------------------------------------------- Patient/Caregiver Education Details [name] Date of Service: 07/01/2020 10:30 AM Medical Record Number: [id number] Patient Account Number: [id number] Date of Birth/Gender: 14-Mar-1935 (85 y.o. M) Treating RN: Carlene Coria Primary Care Physician: Nobie Putnam Other Clinician: Jeanine Luz Referring Physician: Nobie Putnam Treating Physician/Extender: Skipper Cliche in Treatment: 4 Education Assessment Education Provided To: Patient Education Topics Provided Wound/Skin Impairment: Methods: Explain/Verbal Responses: State content correctly Electronic Signature(s) Signed: 07/02/2020 4:27:39 PM By: Carlene Coria RN Entered By: Carlene Coria on 07/01/2020 11:35:01 Mccombie, [name] ([id number]) -------------------------------------------------------------------------------- Wound Assessment Details [name] Date of Service: 07/01/2020 10:30 AM Medical Record Number: [id number] Patient Account Number: [id number] Date of Birth/Sex: [birth year] (85 y.o. M) Treating RN: Carlene Coria Primary Care Doha Boling: Nobie Putnam Other Clinician: Jeanine Luz Referring Jelesa Mangini: Nobie Putnam Treating Ritamarie Arkin/Extender: Skipper Cliche in Treatment: 4 Wound Status Wound Number: 1 Primary Etiology: Pressure Ulcer Wound Location: Right, Lateral Foot Wound Status: Open Wounding Event: Pressure Injury Comorbid History: Cataracts,  Hypertension Date Acquired: 04/03/2020 Weeks Of Treatment: 4 Clustered Wound: No Photos Wound Measurements Length: (cm) 5 Width: (cm) 4.5 Depth: (cm) 0.4 Area: (cm) 17.671 Volume: (cm) 7.069 % Reduction in Area: 21.2% % Reduction in Volume: 37% Epithelialization: None Tunneling: No Undermining: No Wound  Description Classification: Unstageable/Unclassified Wound Margin: Thickened Exudate Amount: Medium Exudate Type: Serosanguineous Exudate Color: red, brown Foul Odor After Cleansing: Yes Due to Product Use: No Slough/Fibrino Yes Wound Bed Granulation Amount: Small (1-33%) Exposed Structure Granulation Quality: Red, Pink, Hyper-granulation Fascia Exposed: No Necrotic Amount: Large (67-100%) Fat Layer (Subcutaneous Tissue) Exposed: Yes Necrotic Quality: Eschar, Adherent Slough Tendon Exposed: No Muscle Exposed: No Joint Exposed: No Bone Exposed: No Treatment Notes Wound #1 (Foot) Wound Laterality: Right, Lateral Cleanser [place] Ancillary Kit - 15 Day Supply Discharge Instruction: Use supplies as instructed; Kit contains: (15) Saline Bullets; (15) 3x3 Gauze; 15 pr Gloves Normal Saline Bullinger, Jonathan D. ([id number]) Discharge Instruction: Wash your hands with soap and water. Remove old dressing, discard into plastic bag and place into trash. Cleanse the wound with Normal Saline prior to applying a clean dressing using gauze sponges, not tissues or cotton balls. Do not scrub or use excessive force. Pat dry using gauze sponges, not tissue or cotton balls. Peri-Wound Care Topical Primary Dressing IODOFLEX 0.9% Cadexomer Iodine Pad Discharge Instruction: Apply Iodoflex to wound bed only as directed. Secondary Dressing ABD Pad 5x9 (in/in) Discharge Instruction: Cover with ABD pad Kerlix 4.5 x 4.1 (in/yd) Discharge Instruction: Apply Kerlix 4.5 x 4.1 (in/yd) as instructed Secured With Tubigrip Size E, 3.5x10 (in/yds) Discharge Instruction: Apply 3 Tubigrip E 3-finger-widths below knee to base of toes to secure dressing and/or for swelling. Compression Wrap Compression Stockings Add-Ons Electronic Signature(s) Signed: 07/01/2020 11:47:12 AM By: Jeanine Luz Signed: 07/02/2020 4:27:39 PM By: Carlene Coria RN Entered By: Jeanine Luz on 07/01/2020 11:11:10 Brar, [name]  ([id number]) -------------------------------------------------------------------------------- Vitals Details [name] Date of Service: 07/01/2020 10:30 AM Medical Record Number: [id number] Patient Account Number: [id number] Date of Birth/Sex: 1934-08-24 (85 y.o. M) Treating RN: Carlene Coria Primary Care Loriene Taunton: Nobie Putnam Other Clinician: Jeanine Luz Referring Marlies Ligman: Nobie Putnam Treating Laniece Hornbaker/Extender: Skipper Cliche in Treatment: 4 Vital Signs Time Taken: 11:02 Temperature (F): 97.9 Height (in): 71 Pulse (bpm): 74 Weight (lbs): 216 Respiratory Rate (breaths/min): 16 Body Mass Index (BMI): 30.1 Blood Pressure (mmHg): 144/83 Reference Range: 80 - 120 mg / dl Electronic Signature(s) Signed: 07/01/2020 11:47:12 AM By: Jeanine Luz Entered By: Jeanine Luz on 07/01/2020 11:04:36

## 2020-07-03 ENCOUNTER — Ambulatory Visit: Payer: Medicare Other | Admitting: Pharmacist

## 2020-07-03 DIAGNOSIS — R413 Other amnesia: Secondary | ICD-10-CM

## 2020-07-03 DIAGNOSIS — I10 Essential (primary) hypertension: Secondary | ICD-10-CM

## 2020-07-03 DIAGNOSIS — E782 Mixed hyperlipidemia: Secondary | ICD-10-CM

## 2020-07-03 NOTE — Patient Instructions (Signed)
Visit Information  PATIENT GOALS: Goals Addressed            This Visit's Progress   . Pharmacy Goals       Our goal bad cholesterol, or LDL, is less than 70 . This is why it is important to continue taking your atorvastatin.  Please check your home blood pressure, keep a log of the results and bring this with you to your medical appointments.  Feel free to call me with any questions or concerns. I look forward to our next call!  Harlow Asa, PharmD, [place] [phone]       The patient verbalized understanding of instructions, educational materials, and care plan provided today and declined offer to receive copy of patient instructions, educational materials, and care plan.   Telephone follow up appointment with care management team member scheduled for: 3/18 at 11:30 am  Harlow Asa, PharmD, [place] [phone]

## 2020-07-03 NOTE — Chronic Care Management (AMB) (Signed)
Chronic Care Management Pharmacy Note  07/03/2020 Name:  [name] MRN:  [id number] DOB:  [birth year]  Subjective: [name] is an 86 y.o. year old male who is a primary patient of Jonathan Hauser, DO.  The CCM team was consulted for assistance with disease management and care coordination needs.    Engaged with patient by telephone for initial visit in response to provider referral for pharmacy case management and/or care coordination services.   Consent to Services:  The patient was given information about Chronic Care Management services, agreed to services, and gave verbal consent prior to initiation of services.  Please see initial visit note for detailed documentation.   Patient Care Team: Jonathan Hauser, DO as PCP - General (Family Medicine) Greg Cutter, [name] as [place] Management (Licensed Clinical Social Worker) Hall Busing, Nobie Putnam, RN as Case Manager ([place]) [place], [name], [facility] as Pharmacist  Recent office visits:  Office Visit with PCP on 3/8 for HTN, medication reconciliation and possible cognitive impairment Friend of patient, [name], presented to appointment with patient. DPR listing [name] scanned into chart.  Recent consult visits:   Office Visit with Urology on 3/9. Provider:  Recommend patient follow up with PCP regarding sleep study  Ordered prostate MRI  Office Visit with Wound Care on 3/14 for follow up. Scheduled to return to [place] on 3/21  Hospital visits: None in previous 6 months  Objective:  Lab Results  Component Value Date   CREATININE 1.25 (H) 06/13/2020   CREATININE 0.98 08/07/2018   CREATININE 1.14 09/18/2017       Component Value Date/Time   CHOL 163 06/13/2020 1113   TRIG 100 06/13/2020 1113   HDL 37 (L) 06/13/2020 1113   CHOLHDL 4.4 06/13/2020 1113   VLDL 21 07/20/2016 0001   LDLCALC 106 (H) 06/13/2020 1113    Hepatic Function Latest Ref  Rng & Units 06/13/2020 09/18/2017 09/08/2017  Total Protein 6.1 - 8.1 g/dL 6.5 7.5 7.8  Albumin 3.5 - 5.0 g/dL - 3.6 4.0  AST 10 - 35 U/L _0 ALT 9 - 46 U/L _1 Alk Phosphatase 38 - 126 U/L - 75 65  Total Bilirubin 0.2 - 1.2 mg/dL 0.6 0.9 0.8  Bilirubin, Direct 0.1 - 0.5 mg/dL - - 0.1    No results found for: TSH, FREET4  CBC Latest Ref Rng & Units 06/13/2020 11/18/2018 08/07/2018  WBC 3.8 - 10.8 Thousand/uL 7.7 6.7 6.2  Hemoglobin 13.2 - 17.1 g/dL 12.1(L) 13.4 12.9(L)  Hematocrit 38.5 - 50.0 % 35.0(L) 39.8 39.4  Platelets 140 - 400 Thousand/uL 194 159 166     Clinical ASCVD: Yes  The ASCVD Risk score Mikey Bussing DC Jr., et al., 2013) failed to calculate for the following reasons:   The 2013 ASCVD risk score is only valid for ages 84 to 26     Social History   Tobacco Use  Smoking Status Former Smoker   Quit date: 1985   Years since quitting: 37.2  Smokeless Tobacco Former User   BP Readings from Last 3 Encounters:  06/26/20 (!) 160/74  06/25/20 140/84  06/13/20 (!) 151/78   Pulse Readings from Last 3 Encounters:  06/26/20 73  06/25/20 73  06/13/20 66   Wt Readings from Last 3 Encounters:  06/26/20 224 lb (101.6 kg)  06/25/20 222 lb 12.8 oz (101.1 kg)  06/13/20 226 lb 6.4 oz (102.7 kg)  Assessment: Review of patient past medical history, allergies, medications, health status, including review of consultants reports, laboratory and other test data, was performed as part of comprehensive evaluation and provision of chronic care management services.   SDOH:  (Social Determinants of Health) assessments and interventions performed: none   CCM Care Plan  No Known Allergies  Medications Reviewed Today    Reviewed by Vella Raring, [place] (Pharmacist) on 07/03/20 at 1456  Med List Status: <None>  Medication Order Taking? Sig Documenting Provider Last Dose Status Informant  aspirin EC 81 MG tablet [id number] Yes Take 1 tablet (81 mg total) by mouth in the  morning. (blood thinner) Karamalegos, Devonne Doughty, DO Taking Active   atorvastatin (LIPITOR) 40 MG tablet [id number] No Take 1 tablet (40 mg total) by mouth at bedtime. (Cholesterol)  Patient not taking: Reported on 07/03/2020   Jonathan Hauser, DO Not Taking Active   clopidogrel (PLAVIX) 75 MG tablet [id number] Yes Take 1 tablet (75 mg total) by mouth in the morning. (blood thinner) [place], Devonne Doughty, DO Taking Active   levofloxacin (LEVAQUIN) 500 MG tablet [id number] Yes Take 500 mg by mouth daily. [provider] Taking Active   lisinopril (ZESTRIL) 20 MG tablet [id number] Yes Take 1 tablet (20 mg total) by mouth in the morning. (Blood pressure) Karamalegos, Devonne Doughty, DO Taking Active   terazosin (HYTRIN) 5 MG capsule [id number] Yes Take 1 capsule (5 mg total) by mouth at bedtime. (blood pressure, prostate) Jonathan Hauser, DO Taking Active           Patient Active Problem List   Diagnosis Date Noted   Cognitive deficit as late effect of cerebrovascular accident (CVA) 06/25/2020   Essential hypertension 06/25/2020   Imbalance 02/01/2018   Humeral head fracture, left, closed, initial encounter 09/22/2017   History of cerebrovascular accident (CVA) with residual deficit 09/09/2017   CKD (chronic kidney disease), stage III ([place]) 09/01/2017   Elevated PSA, between 10 and less than 20 ng/ml 07/22/2016   Bilateral lower extremity edema 07/22/2016   Abnormal glucose 07/15/2016   BPH with obstruction/lower urinary tract symptoms 04/10/2016   Benign hypertension with CKD (chronic kidney disease) stage III ([place]) 04/10/2016   Memory loss 04/10/2016   Gastric outlet obstruction 12/02/2014   Generalized abdominal pain    Benign fibroma of prostate 01/02/2011   HLD (hyperlipidemia) 01/02/2011   Obesity (BMI 30.0-34.9) 01/02/2011    Immunization History  Administered Date(s) Administered   Fluad Quad(high Dose 65+) 12/14/2018   Influenza,  Seasonal, Injecte, Preservative Fre 02/07/2009   Influenza-Unspecified 02/02/2002, 02/04/2004, 01/19/2007, 01/06/2008, 01/18/2010, 12/20/2010, 12/20/2014   Pneumococcal Polysaccharide-23 02/27/2002, 08/31/2017   Td 02/27/2002   Tdap 09/08/2017    Conditions to be addressed/monitored: HTN and HLD  Care Plan : PharmD - Med Mgmt  Updates made by Vella Raring, [place] since 07/03/2020 12:00 AM    Problem: Disease Progression     Long-Range Goal: Disease Progression Prevented or Minimized   Start Date: 07/03/2020  Expected End Date: 10/01/2020  This Visit's Progress: On track  Priority: High  Note:   Current Barriers:   Unable to self-administer medications as prescribed  Chronic Disease Management support and education needs related to memory loss and changes  Does not contact provider office for questions/concerns  Lack of blood pressure readings for clinical team  Pharmacist Clinical Goal(s):   Over the next 90 days, patient will achieve adherence to monitoring guidelines and medication adherence to achieve therapeutic efficacy through collaboration with PharmD  and provider.   Interventions:  1:1 collaboration with Jonathan Hauser, DO regarding development and update of comprehensive plan of care as evidenced by provider attestation and co-signature  Inter-disciplinary care team collaboration (see longitudinal plan of care)  Comprehensive medication review performed; medication list updated in electronic medical record  Speak with patient who has questions about his wound care/changing of dressings.   Place call to [place] with patient on the line today. Advised that RN [name] will call back to patient directly this afternoon to review directions for wound care  Patient reports [name] is not with him today, but has been managing his medications.  Receive call back from patient's friend, [name]  o [name] reports has been filling  weekly pillboxes (AM & PM) for patient o Confirms patient continuing to take course of levofloxacin as directed by Wound Care o Reports was with patient at [place] appointment this week and will review instructions from Wound Care to patient today and attend upcoming appointment with patient as well.  Hypertension:  Current treatment: o Lisinopril 20 mg QAM o Terazosin 5 mg QHS  No home BP readings available today  Hyperlipidemia:  Uncontrolled; current treatment: o None as patient not currently taking atorvastatin o From review of dispensing history in chart, note atorvastatin last filled on 2/3 for 90 day supply o Caregiver reports he and patient have been unable to locate this bottle  Place call to [place] today. Request RPh refill atorvastatin Rx early due to misplaced Rx. Lisa refills Rx and applies discount card for patient  Follow up with Caregiver who agrees to pick up atorvastatin refill for patient to restart medication  Medication Adherence:  Caregiver, [name], currently managing medications and filling weekly pillboxes for patient  Discuss pill packaging with both patient and Caregiver today, but prefer to discuss further on Friday when both can be together  Patient Goals/Self-Care Activities  Over the next 90 days, patient will:  - focus on medication adherence by using weekly pillbox with assistance from/as filled by friend, [name]  Follow Up Plan: Telephone follow up appointment with care management team member scheduled for: 3/18 at 11:30 am     Patient's preferred pharmacy is:  [facility] - [address]ia Buff, [place] [address] Phone: [phone] Fax: [phone]  [place] [address] [place] [address] [place] [address] Phone: [phone] Fax: [phone]  [facility] [place], [place] [address] [place] [address] Phone: [phone] Fax: [place], [place], [address] [place], [place] [address] Phone: [phone] Fax: [place], [place] [address] Phone: [phone] Fax: [phone]  Uses pill box? Yes  Follow Up:  Patient agrees to Care Plan and Follow-up.  Plan: Telephone follow up appointment with care management team member scheduled for:  3/18 at 11:30 am  Harlow Asa, PharmD, [place] [phone]

## 2020-07-04 ENCOUNTER — Telehealth: Payer: Medicare Other

## 2020-07-05 ENCOUNTER — Ambulatory Visit: Payer: Medicare Other | Admitting: Pharmacist

## 2020-07-05 DIAGNOSIS — E782 Mixed hyperlipidemia: Secondary | ICD-10-CM

## 2020-07-05 DIAGNOSIS — R413 Other amnesia: Secondary | ICD-10-CM

## 2020-07-05 NOTE — Patient Instructions (Signed)
Visit Information  PATIENT GOALS: Goals Addressed            This Visit's Progress   . Pharmacy Goals       Our goal bad cholesterol, or LDL, is less than 70 . This is why it is important to continue taking your atorvastatin.  Please check your home blood pressure, keep a log of the results and bring this with you to your medical appointments.  Feel free to call me with any questions or concerns. I look forward to our next call!   Harlow Asa, PharmD, [place] [phone]       The patient verbalized understanding of instructions, educational materials, and care plan provided today and declined offer to receive copy of patient instructions, educational materials, and care plan.   Telephone follow up appointment with care management team member scheduled for: 3/25  Harlow Asa, PharmD, [facility] [phone]

## 2020-07-05 NOTE — Chronic Care Management (AMB) (Signed)
Chronic Care Management Pharmacy Note  07/05/2020 Name:  [name] MRN:  [id number] DOB:  1934/08/08  Subjective: [name] is an 86 y.o. year old male who is a primary patient of Jonathan Hauser, DO.  The CCM team was consulted for assistance with disease management and care coordination needs.    Engaged with patient by telephone for follow up visit in response to provider referral for pharmacy case management and/or care coordination services.   Consent to Services:  The patient was given information about Chronic Care Management services, agreed to services, and gave verbal consent prior to initiation of services.  Please see initial visit note for detailed documentation.   Patient Care Team: Jonathan Hauser, DO as PCP - General (Family Medicine) Greg Cutter, [name] as [place] Management (Licensed Clinical Social Worker) Hall Busing, Nobie Putnam, RN as Case Manager ([place]) [place], Virl [place], [facility] as Pharmacist  Hospital visits: None in previous 6 months  Objective:  Lab Results  Component Value Date   CREATININE 1.25 (H) 06/13/2020   CREATININE 0.98 08/07/2018   CREATININE 1.14 09/18/2017    Lab Results  Component Value Date   HGBA1C 5.8 (H) 06/13/2020       Component Value Date/Time   CHOL 163 06/13/2020 1113   TRIG 100 06/13/2020 1113   HDL 37 (L) 06/13/2020 1113   CHOLHDL 4.4 06/13/2020 1113   VLDL 21 07/20/2016 0001   LDLCALC 106 (H) 06/13/2020 1113    Hepatic Function Latest Ref Rng & Units 06/13/2020 09/18/2017 09/08/2017  Total Protein 6.1 - 8.1 g/dL 6.5 7.5 7.8  Albumin 3.5 - 5.0 g/dL - 3.6 4.0  AST 10 - 35 U/L _0 ALT 9 - 46 U/L _1 Alk Phosphatase 38 - 126 U/L - 75 65  Total Bilirubin 0.2 - 1.2 mg/dL 0.6 0.9 0.8  Bilirubin, Direct 0.1 - 0.5 mg/dL - - 0.1    Social History   Tobacco Use  Smoking Status Former Smoker  . Quit date: 17  . Years since quitting: 37.2  Smokeless  Tobacco Former User   BP Readings from Last 3 Encounters:  06/26/20 (!) 160/74  06/25/20 140/84  06/13/20 (!) 151/78   Pulse Readings from Last 3 Encounters:  06/26/20 73  06/25/20 73  06/13/20 66   Wt Readings from Last 3 Encounters:  06/26/20 224 lb (101.6 kg)  06/25/20 222 lb 12.8 oz (101.1 kg)  06/13/20 226 lb 6.4 oz (102.7 kg)    Assessment: Review of patient past medical history, allergies, medications, health status, including review of consultants reports, laboratory and other test data, was performed as part of comprehensive evaluation and provision of chronic care management services.   SDOH:  (Social Determinants of Health) assessments and interventions performed: none   CCM Care Plan  No Known Allergies  Medications Reviewed Today    Reviewed by [name], [place] (Pharmacist) on 07/05/20 at 1242  Med List Status: <None>  Medication Order Taking? Sig Documenting Provider Last Dose Status Informant  aspirin EC 81 MG tablet [id number]  Take 1 tablet (81 mg total) by mouth in the morning. (blood thinner) Karamalegos, Devonne Doughty, DO  Active   atorvastatin (LIPITOR) 40 MG tablet [id number] Yes Take 1 tablet (40 mg total) by mouth at bedtime. (Cholesterol) Jonathan Hauser, DO Taking Active   clopidogrel (PLAVIX) 75 MG tablet [id number]  Take 1 tablet (75 mg total) by mouth in  the morning. (blood thinner) Karamalegos, Devonne Doughty, DO  Active   levofloxacin (LEVAQUIN) 500 MG tablet [id number]  Take 500 mg by mouth daily. [provider]  Active   lisinopril (ZESTRIL) 20 MG tablet [id number]  Take 1 tablet (20 mg total) by mouth in the morning. (Blood pressure) Karamalegos, Devonne Doughty, DO  Active   terazosin (HYTRIN) 5 MG capsule [id number]  Take 1 capsule (5 mg total) by mouth at bedtime. (blood pressure, prostate) Jonathan Hauser, DO  Active           Patient Active Problem List   Diagnosis Date Noted  . 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 Essential hypertension 06/25/2020  . Imbalance 02/01/2018  . Humeral head fracture, left, closed, initial encounter 09/22/2017  . History of cerebrovascular accident (CVA) with residual deficit 09/09/2017  . CKD (chronic kidney disease), stage III ([place]) 09/01/2017  . Elevated PSA, between 10 and less than 20 ng/ml 07/22/2016  . Bilateral lower extremity edema 07/22/2016  . Abnormal glucose 07/15/2016  . BPH with obstruction/lower urinary tract symptoms 04/10/2016  . Benign hypertension with CKD (chronic kidney disease) stage III ([place]) 04/10/2016  . Memory loss 04/10/2016  . Gastric outlet obstruction 12/02/2014  . Generalized abdominal pain   . Benign fibroma of prostate 01/02/2011  . HLD (hyperlipidemia) 01/02/2011  . Obesity (BMI 30.0-34.9) 01/02/2011    Immunization History  Administered Date(s) Administered  . Fluad Quad(high Dose 65+) 12/14/2018  . Influenza, Seasonal, Injecte, Preservative Fre 02/07/2009  . Influenza-Unspecified 02/02/2002, 02/04/2004, 01/19/2007, 01/06/2008, 01/18/2010, 12/20/2010, 12/20/2014  . Pneumococcal Polysaccharide-23 02/27/2002, 08/31/2017  . Td 02/27/2002  . Tdap 09/08/2017    Conditions to be addressed/monitored: HTN, HLD  Care Plan : PharmD - Med Mgmt  Updates made by [name], [place] since 07/05/2020 12:00 AM    Problem: Disease Progression     Long-Range Goal: Disease Progression Prevented or Minimized   Start Date: 07/03/2020  Expected End Date: 10/01/2020  Recent Progress: On track  Priority: High  Note:   Current Barriers:  . Unable to self-administer medications as prescribed . Chronic Disease Management support and education needs related to memory loss and changes . Does not contact provider office for questions/concerns . Lack of blood pressure readings for clinical team  Pharmacist Clinical Goal(s):   Over the next 90 days, patient will achieve adherence to monitoring  guidelines and medication adherence to achieve therapeutic efficacy through collaboration with PharmD and provider.   Interventions: . 1:1 collaboration with Jonathan Hauser, DO regarding development and update of comprehensive plan of care as evidenced by provider attestation and co-signature . Inter-disciplinary care team collaboration (see longitudinal plan of care) . Reach patient by phone today, but reports not a good time to talk. Marland Kitchen Reach patient's friend/caregiver, [name]. o [name] reports that he is not with patient today o Reports patient has seemed to be feeling better and having more energy o Reports continuing to fill weekly pillboxes (AM & PM) for patient o Reports planning to attend upcoming Wound Care appointment with patient  Hyperlipidemia: . Uncontrolled; current treatment: atorvastatin 40 mg daily . Caregiver reports has picked up atorvastatin from [facility] and patient has resumed taking.  Medication Adherence: . Caregiver, [name], currently managing medications and filling weekly pillboxes for patient . Unable to setup pill packaging for patient today, but [name] reports patient agreeable to start using pill packs from [place] that he/patient can  call [facility] to get process started o Caregiver reports that he and patient prefer to go by [facility] together in person with patient's medications to discuss switching to their pharmacy for pill packaging. Marland Kitchen Counsel caregiver to bring copy of medication list from patient's latest visit with PCP [facility] when goes to [facility].  Patient Goals/Self-Care Activities  Over the next 90 days, patient will:  - focus on medication adherence by using weekly pillbox with assistance from/as filled by friend, [name]  Follow Up Plan: Telephone follow up appointment with care management team member scheduled for: 3/25 at 10:30 am      Patient's preferred pharmacy  is:  [facility] - Vernia [place], [place] [address] Phone: [phone] Fax: [phone]  [place] [address] [place] [address] Phone: [phone] Fax: [phone]  [facility] [place], [place] [address] [place] [address] Phone: [phone] Fax: [place], [place], [address] [place], [place] [address] Phone: [phone] Fax: [place], [place] [address] Phone: [phone] Fax: [phone]  Uses pill box? Yes  Follow Up:  Caregiver agrees to Care Plan and Follow-up.  Plan: Telephone follow up appointment with care management team member scheduled for:  3/25 at 10:30 am  Harlow Asa, PharmD, [place] [phone]

## 2020-07-08 ENCOUNTER — Other Ambulatory Visit: Payer: Self-pay

## 2020-07-08 ENCOUNTER — Telehealth: Payer: Self-pay

## 2020-07-08 ENCOUNTER — Encounter: Payer: Medicare Other | Admitting: Physician Assistant

## 2020-07-08 DIAGNOSIS — Z7901 Long term (current) use of anticoagulants: Secondary | ICD-10-CM | POA: Diagnosis not present

## 2020-07-08 DIAGNOSIS — L89610 Pressure ulcer of right heel, unstageable: Secondary | ICD-10-CM | POA: Diagnosis not present

## 2020-07-08 DIAGNOSIS — L89890 Pressure ulcer of other site, unstageable: Secondary | ICD-10-CM | POA: Diagnosis not present

## 2020-07-08 DIAGNOSIS — R296 Repeated falls: Secondary | ICD-10-CM | POA: Diagnosis not present

## 2020-07-08 NOTE — Progress Notes (Addendum)
DEKLEN, POPELKA ([id number]) Visit Report for 07/08/2020 Chief Complaint Document Details [name], UMHOLTZ. Date of Service: 07/08/2020 10:30 AM Medical Record Number: [id number] Patient Account Number: [id number] Date of Birth/Sex: [birth year] (86 y.o. M) Treating RN: Carlene Coria Primary Care Provider: Nobie Putnam Other Clinician: Jeanine Luz Referring Provider: Nobie Putnam Treating Provider/Extender: Skipper Cliche in Treatment: 5 Information Obtained from: Patient Chief Complaint Right heel ulcer Electronic Signature(s) Signed: 07/08/2020 10:58:44 AM By: Worthy Keeler PA-C Entered By: Worthy Keeler on 07/08/2020 10:58:44 Cullinane, [name] ([id number]) -------------------------------------------------------------------------------- HPI Details [name] Date of Service: 07/08/2020 10:30 AM Medical Record Number: [id number] Patient Account Number: [id number] Date of Birth/Sex: November 11, 1934 (86 y.o. M) Treating RN: Carlene Coria Primary Care Provider: Nobie Putnam Other Clinician: Jeanine Luz Referring Provider: Nobie Putnam Treating Provider/Extender: Skipper Cliche in Treatment: 5 History of Present Illness HPI Description: 06/03/2020 upon evaluation today patient actually presents for initial inspection here in our clinic after having gone to urgent care this morning where they were concerned about infection as well as a significant "softball sized wound with eschar" over the heel. With that being said the patient fortunately does not have an area that largely does have an area of eschar which is draining around the edges really not stable and I think we are getting to remove the eschar so we try to clean up the surface of the wound. Fortunately there is no evidence of active infection at this time. I do believe that he is going require some debridement both manually today with scissors and forceps as well  as enzymatic/chemically with the medications. I really feel like that he has a good chance to get this to heal if he keep pressure off of it we did discuss that again today. He does note this has been present for about 2 months. He is not taking any of his current medications that we did put him on the list he tells me that he only occasionally takes them which is obviously not doing anything for him. He does have a cane but he does not use it and his ABI was 1.14 on the right. 06/10/2020 upon evaluation today patient appears to be doing well with regard to his wound all things considered. He still has a lot of necrotic tissue in the central portion of the wound. I think the Iodosorb is still the right thing to do and again the biggest issue is he quit using it as he and his wife felt like that the drainage following putting on the medicine was really "nasty". Nonetheless I explained that the weight Iodoflex breaks down or Iodosorb and that round does make it look really bad but it actually is not as bad as what it seems. In fact it is the normal course of things. 06/24/2020 upon evaluation today patient appears to be doing unfortunately about the same in regard to his heel. I do not think this is any worse. However its become increasingly clear that he has significant dementia in my opinion. We have had multiple instances going on here from the standpoint of trying to get him to take the oral antibiotic. Initially we ordered it for him he picked it up on 1 afternoon and by the next morning we called to make sure that he had gotten it he told us yes but that he had thrown it away. That he was not to take any additional medications. We then told him he needed  to be taken this to get the infection under control he subsequently look for it only found the bottle never took the medication therefore I sent it in a second time for him. After sending this and it looks like he has not really taken it since that  time he tells me that he took 7 pills at once although I do not think they were all of the same medication and then following this it made him sick I think these were all of his different medicines. He decided he was not to take any further medication after that point. Nonetheless I do believe he still is going require treatment for this infection the good news is he has a friend who is a very good friend that is with him here today to try to help with figuring out what to do and how going forward this should be taken care of. 07/01/2020 upon evaluation today patient appears to be doing really about the same. His measurements are maybe slightly smaller than previous. Fortunately there is no signs of active infection which is great news. No fevers, chills, nausea, vomiting, or diarrhea. 07/08/2020 upon evaluation today patient appears to be doing better in regard to his wounds. He looks like he finally actually took the Levaquin which has helped with the infection. I think things are doing much better in that regard which is great news. No fevers, chills, nausea, vomiting, or diarrhea. Unfortunately he did have actual Kleenex/tissue paper over the wound on really not sure why his wife put that on there but nonetheless I think were ready to start wrapping this week. Electronic Signature(s) Signed: 07/08/2020 12:45:27 PM By: Worthy Keeler PA-C Entered By: Worthy Keeler on 07/08/2020 12:45:26 Highley, [name] ([id number]) -------------------------------------------------------------------------------- Physical Exam Details [name] Date of Service: 07/08/2020 10:30 AM Medical Record Number: [id number] Patient Account Number: [id number] Date of Birth/Sex: [birth year] (86 y.o. M) Treating RN: Carlene Coria Primary Care Provider: Nobie Putnam Other Clinician: Jeanine Luz Referring Provider: Nobie Putnam Treating Provider/Extender: Skipper Cliche in  Treatment: 5 Constitutional Well-nourished and well-hydrated in no acute distress. Respiratory normal breathing without difficulty. Psychiatric this patient is able to make decisions and demonstrates good insight into disease process. Alert and Oriented x 3. pleasant and cooperative. Notes Upon inspection patient's wound bed actually showed signs of some hyper granulation otherwise that it seems that he is doing much better with good improvement in regard to the erythema and drainage which is great news. Obviously needs to continue to finish off the antibiotic and after that hopefully we should be good to go I do think with undergoing compression wrapping today. Electronic Signature(s) Signed: 07/08/2020 12:45:51 PM By: Worthy Keeler PA-C Entered By: Worthy Keeler on 07/08/2020 12:45:51 Fulco, [name] ([id number]) -------------------------------------------------------------------------------- Physician Orders Details [name] Date of Service: 07/08/2020 10:30 AM Medical Record Number: [id number] Patient Account Number: [id number] Date of Birth/Sex: 11-Jan-1935 (86 y.o. M) Treating RN: Carlene Coria Primary Care Provider: Nobie Putnam Other Clinician: Jeanine Luz Referring Provider: Nobie Putnam Treating Provider/Extender: Skipper Cliche in Treatment: 5 Verbal / Phone Orders: No Diagnosis Coding ICD-10 Coding Code Description L89.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, unstageable R29.6 Repeated falls Z79.01 Long term (current) use of anticoagulants Follow-up Appointments o Return Appointment in 1 week. Edema Control - Lymphedema / Segmental Compressive Device / Other o Elevate, Exercise Daily and Avoid Standing for Long Periods of Time. o Elevate legs to the level of  the heart and pump ankles as often as possible o Elevate leg(s) parallel to the floor when sitting. Wound Treatment Wound #1 - Foot Wound Laterality: Right,  Lateral Cleanser: Byram Ancillary Kit - 15 Day Supply (Generic) 3 x Per Week/30 Days Discharge Instructions: Use supplies as instructed; Kit contains: (15) Saline Bullets; (15) 3x3 Gauze; 15 pr Gloves Cleanser: Normal Saline (Generic) 3 x Per Week/30 Days Discharge Instructions: Wash your hands with soap and water. Remove old dressing, discard into plastic bag and place into trash. Cleanse the wound with Normal Saline prior to applying a clean dressing using gauze sponges, not tissues or cotton balls. Do not scrub or use excessive force. Pat dry using gauze sponges, not tissue or cotton balls. Primary Dressing: Hydrofera Blue Ready Transfer Foam, 2.5x2.5 (in/in) (Generic) 3 x Per Week/30 Days Discharge Instructions: Apply Hydrofera Blue Ready to wound bed as directed Secondary Dressing: ABD Pad 5x9 (in/in) (Generic) 3 x Per Week/30 Days Discharge Instructions: Cover with ABD pad Compression Wrap: Profore Lite LF 3 Multilayer Compression Bandaging System (Generic) 3 x Per Week/30 Days Discharge Instructions: Apply 3 multi-layer wrap as prescribed. Electronic Signature(s) Signed: 07/08/2020 4:49:49 PM By: Worthy Keeler PA-C Signed: 07/26/2020 8:11:40 AM By: Carlene Coria RN Entered By: Carlene Coria on 07/08/2020 11:16:46 Blumer, [name] ([id number]) -------------------------------------------------------------------------------- Problem List Details [name] Date of Service: 07/08/2020 10:30 AM Medical Record Number: [id number] Patient Account Number: [id number] Date of Birth/Sex: 05-22-1934 (86 y.o. M) Treating RN: Carlene Coria Primary Care Provider: Nobie Putnam Other Clinician: Jeanine Luz Referring Provider: Nobie Putnam Treating Provider/Extender: Skipper Cliche in Treatment: 5 Active Problems ICD-10 Encounter Code Description Active Date MDM Diagnosis L89.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, unstageable 06/03/2020 No Yes R29.6 Repeated falls  06/03/2020 No Yes Z79.01 Long term (current) use of anticoagulants 06/03/2020 No Yes Inactive Problems Resolved Problems Electronic Signature(s) Signed: 07/08/2020 10:58:39 AM By: Worthy Keeler PA-C Entered By: Worthy Keeler on 07/08/2020 10:58:39 Lapid, [name] ([id number]) -------------------------------------------------------------------------------- Progress Note Details [name] Date of Service: 07/08/2020 10:30 AM Medical Record Number: [id number] Patient Account Number: [id number] Date of Birth/Sex: [birth year] (86 y.o. M) Treating RN: Carlene Coria Primary Care Provider: Nobie Putnam Other Clinician: Jeanine Luz Referring Provider: Nobie Putnam Treating Provider/Extender: Skipper Cliche in Treatment: 5 Subjective Chief Complaint Information obtained from Patient Right heel ulcer History of Present Illness (HPI) 06/03/2020 upon evaluation today patient actually presents for initial inspection here in our clinic after having gone to urgent care this morning where they were concerned about infection as well as a significant "softball sized wound with eschar" over the heel. With that being said the patient fortunately does not have an area that largely does have an area of eschar which is draining around the edges really not stable and I think we are getting to remove the eschar so we try to clean up the surface of the wound. Fortunately there is no evidence of active infection at this time. I do believe that he is going require some debridement both manually today with scissors and forceps as well as enzymatic/chemically with the medications. I really feel like that he has a good chance to get this to heal if he keep pressure off of it we did discuss that again today. He does note this has been present for about 2 months. He is not taking any of his current medications that we did put him on the list he tells me that he  only occasionally  takes them which is obviously not doing anything for him. He does have a cane but he does not use it and his ABI was 1.14 on the right. 06/10/2020 upon evaluation today patient appears to be doing well with regard to his wound all things considered. He still has a lot of necrotic tissue in the central portion of the wound. I think the Iodosorb is still the right thing to do and again the biggest issue is he quit using it as he and his wife felt like that the drainage following putting on the medicine was really "nasty". Nonetheless I explained that the weight Iodoflex breaks down or Iodosorb and that round does make it look really bad but it actually is not as bad as what it seems. In fact it is the normal course of things. 06/24/2020 upon evaluation today patient appears to be doing unfortunately about the same in regard to his heel. I do not think this is any worse. However its become increasingly clear that he has significant dementia in my opinion. We have had multiple instances going on here from the standpoint of trying to get him to take the oral antibiotic. Initially we ordered it for him he picked it up on 1 afternoon and by the next morning we called to make sure that he had gotten it he told us yes but that he had thrown it away. That he was not to take any additional medications. We then told him he needed to be taken this to get the infection under control he subsequently look for it only found the bottle never took the medication therefore I sent it in a second time for him. After sending this and it looks like he has not really taken it since that time he tells me that he took 7 pills at once although I do not think they were all of the same medication and then following this it made him sick I think these were all of his different medicines. He decided he was not to take any further medication after that point. Nonetheless I do believe he still is going require treatment for this  infection the good news is he has a friend who is a very good friend that is with him here today to try to help with figuring out what to do and how going forward this should be taken care of. 07/01/2020 upon evaluation today patient appears to be doing really about the same. His measurements are maybe slightly smaller than previous. Fortunately there is no signs of active infection which is great news. No fevers, chills, nausea, vomiting, or diarrhea. 07/08/2020 upon evaluation today patient appears to be doing better in regard to his wounds. He looks like he finally actually took the Levaquin which has helped with the infection. I think things are doing much better in that regard which is great news. No fevers, chills, nausea, vomiting, or diarrhea. Unfortunately he did have actual Kleenex/tissue paper over the wound on really not sure why his wife put that on there but nonetheless I think were ready to start wrapping this week. Objective Constitutional Well-nourished and well-hydrated in no acute distress. Vitals Time Taken: 10:49 AM, Height: 71 in, Weight: 216 lbs, BMI: 30.1, Temperature: 97.7 F, Pulse: 80 bpm, Respiratory Rate: 16 breaths/min, Blood Pressure: 91/60 mmHg. Respiratory normal breathing without difficulty. Psychiatric this patient is able to make decisions and demonstrates good insight into disease process. Alert and Oriented x 3. pleasant and cooperative.  RAMARI, BRAY ([id number]) General Notes: Upon inspection patient's wound bed actually showed signs of some hyper granulation otherwise that it seems that he is doing much better with good improvement in regard to the erythema and drainage which is great news. Obviously needs to continue to finish off the antibiotic and after that hopefully we should be good to go I do think with undergoing compression wrapping today. Integumentary (Hair, Skin) Wound #1 status is Open. Original cause of wound was Pressure Injury. The date  acquired was: 04/03/2020. The wound has been in treatment 5 weeks. The wound is located on the Right,Lateral Foot. The wound measures 5cm length x 4.5cm width x 0.2cm depth; 17.671cm^2 area and 3.534cm^3 volume. There is Fat Layer (Subcutaneous Tissue) exposed. There is no tunneling or undermining noted. There is a medium amount of serosanguineous drainage noted. The wound margin is thickened. There is medium (34-66%) red, pink, hyper - granulation within the wound bed. There is a medium (34-66%) amount of necrotic tissue within the wound bed including Eschar and Adherent Slough. Assessment Active Problems ICD-10 Pressure ulcer of right heel, unstageable Repeated falls Long term (current) use of anticoagulants Procedures Wound #1 Pre-procedure diagnosis of Wound #1 is a Pressure Ulcer located on the Right,Lateral Foot . There was a Three Layer Compression Therapy Procedure by Carlene Coria, RN. Post procedure Diagnosis Wound #1: Same as Pre-Procedure Plan Follow-up Appointments: Return Appointment in 1 week. Edema Control - Lymphedema / Segmental Compressive Device / Other: Elevate, Exercise Daily and Avoid Standing for Long Periods of Time. Elevate legs to the level of the heart and pump ankles as often as possible Elevate leg(s) parallel to the floor when sitting. WOUND #1: - Foot Wound Laterality: Right, Lateral Cleanser: Byram Ancillary Kit - 15 Day Supply (Generic) 3 x Per Week/30 Days Discharge Instructions: Use supplies as instructed; Kit contains: (15) Saline Bullets; (15) 3x3 Gauze; 15 pr Gloves Cleanser: Normal Saline (Generic) 3 x Per Week/30 Days Discharge Instructions: Wash your hands with soap and water. Remove old dressing, discard into plastic bag and place into trash. Cleanse the wound with Normal Saline prior to applying a clean dressing using gauze sponges, not tissues or cotton balls. Do not scrub or use excessive force. Pat dry using gauze sponges, not tissue or  cotton balls. Primary Dressing: Hydrofera Blue Ready Transfer Foam, 2.5x2.5 (in/in) (Generic) 3 x Per Week/30 Days Discharge Instructions: Apply Hydrofera Blue Ready to wound bed as directed Secondary Dressing: ABD Pad 5x9 (in/in) (Generic) 3 x Per Week/30 Days Discharge Instructions: Cover with ABD pad Compression Wrap: Profore Lite LF 3 Multilayer Compression Bandaging System (Generic) 3 x Per Week/30 Days Discharge Instructions: Apply 3 multi-layer wrap as prescribed. 1. Would recommend currently that we going continue with the wound care measures as before specifically with regard to the compression wrap I think this could be new but nonetheless something better for him. 2. I am also going to suggest switching over to [facility] and away from the Iodoflex as I think that honestly that is no longer necessary. 3. He should complete the antibiotic therapy as I do feel like the Levaquin did a good job in this regard. We will see patient back for reevaluation in 1 week here in the clinic. If anything worsens or changes patient will contact our office for additional recommendations. JERRIT, HOREN ([id number]) Electronic Signature(s) Signed: 07/08/2020 12:46:20 PM By: Worthy Keeler PA-C Entered By: Worthy Keeler on 07/08/2020 12:46:20 Olheiser, [name] ([id number]) --------------------------------------------------------------------------------  SuperBill Details Patient Name: KHALIL, SZCZEPANIK. Date of Service: 07/08/2020 Medical Record Number: [id number] Patient Account Number: [id number] Date of Birth/Sex: 28-Nov-1934 (86 y.o. M) Treating RN: Carlene Coria Primary Care Provider: Nobie Putnam Other Clinician: Jeanine Luz Referring Provider: Nobie Putnam Treating Provider/Extender: Skipper Cliche in Treatment: 5 Diagnosis Coding ICD-10 Codes Code Description L89.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, unstageable R29.6 Repeated falls Z79.01 Long term (current) use of  anticoagulants Facility Procedures CPT4 Code: [id number] Description: (Facility Use Only) [phone] - [facility] RT LEG Modifier: Quantity: 1 Physician Procedures CPT4 Code: [id number] Description: 22979 - WC PHYS LEVEL 3 - EST PT Modifier: Quantity: 1 CPT4 Code: Description: ICD-10 Diagnosis Description L89.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, unstageable R29.6 Repeated falls Z79.01 Long term (current) use of anticoagulants Modifier: Quantity: Electronic Signature(s) Signed: 07/08/2020 12:46:37 PM By: Worthy Keeler PA-C Entered By: Worthy Keeler on 07/08/2020 12:46:37

## 2020-07-08 NOTE — Telephone Encounter (Signed)
Copied from [place] [phone]. Topic: General - Other >> Jul 08, 2020  1:07 PM Pawlus, Brayton Layman A wrote: Reason for CRM: Pts pharmacy called asking for his Med list to be sent or faxed over. FAX [phone]

## 2020-07-09 ENCOUNTER — Ambulatory Visit: Payer: Medicare Other | Admitting: Licensed Clinical Social Worker

## 2020-07-09 DIAGNOSIS — N183 Chronic kidney disease, stage 3 unspecified: Secondary | ICD-10-CM

## 2020-07-09 DIAGNOSIS — E782 Mixed hyperlipidemia: Secondary | ICD-10-CM

## 2020-07-09 DIAGNOSIS — I10 Essential (primary) hypertension: Secondary | ICD-10-CM

## 2020-07-09 DIAGNOSIS — I129 Hypertensive chronic kidney disease with stage 1 through stage 4 chronic kidney disease, or unspecified chronic kidney disease: Secondary | ICD-10-CM

## 2020-07-09 DIAGNOSIS — R413 Other amnesia: Secondary | ICD-10-CM

## 2020-07-09 NOTE — Patient Instructions (Signed)
Licensed Clinical Social Worker Visit Information  Goals we discussed today:  Goals Addressed              This Visit's Progress   .  "I need help with caregiving in the home" (pt-stated)        Timeframe:  Long-Range Goal Priority:  Medium Start Date:   07/09/20                   Expected End Date: 10/09/20                   Follow Up Date- 08/21/20  Current Barrers:  . Limited social support . ADL IADL limitations . Social Isolation . Limited access to caregiver . Inability to perform ADL's independently . Inability to perform IADL's independently . Lacks knowledge of community resource: personal care services  Clinical Social Work Clinical Goal(s):  Marland Kitchen Over the next 120 days, patient will work with SW to address concerns related to lack of care within the home  Interventions: . Patient and family interviewed and appropriate assessments performed . [name] and his wife are close church friends to patient for over 30 years and have been assisting him with his care. They live in [facility] but have been staying at patient's house. [name]'s number is [phone]. [name] took patient to PCP office visit today on 06/25/20 and picked up his medications. [name]'s wife is going to order patient some medical supplies which include a pill box for both morning and evenings. [name] was unaware of patient's urology appointment tomorrow and was appreciative of CCM [name]'s reminder. [name] and spouse will transport him there today on 06/26/20 at 1:30 pm. CCM [name] advised [name] to return CCM Pharmacist's call and he was agreeable to this plan. Update- [name] reports that patient continues to turn his phone off so patient's sister is going to patient's house today to assist him and turn his phone on. [name] reports that he and his wife continue to assist patient daily with his care giving needs. They were able to get all of his medications (besides the antibiotic for his foot) in a pill  pack which will make medication management easier on the patient and family as a whole. [name]'s spouse has a few medication questions and wishes to contact CCM Pharmacist this week for education and assistance. Per [name], patient is doing a lot better since he has been taking his medications as prescribed. [name] reports that patient is more active and is able to get around the house more. [name] reports that [facility] is involved as well.   . Past update-CCM [name] was unable to reach patient or spouse but successfully spoke with his sister on 06/19/20. Sister reports that patient is doing better and that they go to church together each Sunday. She reports that she cares a lot for her brother and is there for him when needed but she is 84 years old and has her own health concerns.   . Provided patient and family with information about available personal care services within the area such as C.H.O.R.E program. Patient and family feel that he will not be eligible for Medicaid. Patient is now on wait list for C.H.O.R.E . Discussed plans with patient for ongoing care management follow up and provided patient with direct contact information for care management team . Advised patient to considering hiring a private pay caregiver as Medicare does not cover these services . Assisted patient/caregiver with obtaining information about health plan  benefits . Provided education and assistance to client regarding Advanced Directives. . Provided education to patient/caregiver regarding level of care options. . Provided education to patient/caregiver about Hospice and/or Palliative Care services . [name] will place patient on wait list for C.H.O.R.E program during next scheduled CCM appointment---Application completed and patient was successfully added on the wait list for C.H.O.R.E program on 08/08/19. Patient was provided this update. Patient was provided Home Care Providers contact information in the case that he wishes to  inquire where he is at on the wait list. Sister was provided this information as well on 06/19/20  Patient Self Care Activities:  . Attends all scheduled provider appointments . Calls provider office for new concerns or questions . Lacks social connections . Unable to perform ADLs independently . Unable to perform IADLs independently  Please see past updates related to this goal by clicking on the "Past Updates" button in the selected goal

## 2020-07-09 NOTE — Chronic Care Management (AMB) (Signed)
Chronic Care Management    Clinical Social Work Note  07/09/2020 Name: [name] MRN: [id number] DOB: 01/12/1935  [name] is a 86 y.o. year old male who is a primary care patient of [name], DO. The CCM team was consulted to assist the patient with chronic disease management and/or care coordination needs related to: Level of Care Concerns.   Collaboration with caregiver, [name] for follow up visit in response to provider referral for social work chronic care management and care coordination services.   Consent to Services:  The patient was given the following information about Chronic Care Management services today, agreed to services, and gave verbal consent: 1. CCM service includes personalized support from designated clinical staff supervised by the primary care provider, including individualized plan of care and coordination with other care providers 2. 24/7 contact phone numbers for assistance for urgent and routine care needs. 3. Service will only be billed when office clinical staff spend 20 minutes or more in a month to coordinate care. 4. Only one practitioner may furnish and bill the service in a calendar month. 5.The patient may stop CCM services at any time (effective at the end of the month) by phone call to the office staff. 6. The patient will be responsible for cost sharing (co-pay) of up to 20% of the service fee (after annual deductible is met). Patient agreed to services and consent obtained.  Patient agreed to services and consent obtained.   Assessment: Review of patient past medical history, allergies, medications, and health status, including review of relevant consultants reports was performed today as part of a comprehensive evaluation and provision of chronic care management and care coordination services.     SDOH (Social Determinants of Health) assessments and interventions performed:    Advanced Directives Status: Not addressed in this  encounter. CCM Care Plan  No Known Allergies  Outpatient Encounter Medications as of 07/09/2020  Medication Sig  . aspirin EC 81 MG tablet Take 1 tablet (81 mg total) by mouth in the morning. (blood thinner)  . atorvastatin (LIPITOR) 40 MG tablet Take 1 tablet (40 mg total) by mouth at bedtime. (Cholesterol)  . clopidogrel (PLAVIX) 75 MG tablet Take 1 tablet (75 mg total) by mouth in the morning. (blood thinner)  . levofloxacin (LEVAQUIN) 500 MG tablet Take 500 mg by mouth daily.  Marland Kitchen lisinopril (ZESTRIL) 20 MG tablet Take 1 tablet (20 mg total) by mouth in the morning. (Blood pressure)  . terazosin (HYTRIN) 5 MG capsule Take 1 capsule (5 mg total) by mouth at bedtime. (blood pressure, prostate)   No facility-administered encounter medications on file as of 07/09/2020.    Patient Active Problem List   Diagnosis Date Noted  . Cognitive deficit as late effect of cerebrovascular accident (CVA) 06/25/2020  . Essential hypertension 06/25/2020  . Imbalance 02/01/2018  . Humeral head fracture, left, closed, initial encounter 09/22/2017  . History of cerebrovascular accident (CVA) with residual deficit 09/09/2017  . CKD (chronic kidney disease), stage III ([place]) 09/01/2017  . Elevated PSA, between 10 and less than 20 ng/ml 07/22/2016  . Bilateral lower extremity edema 07/22/2016  . Abnormal glucose 07/15/2016  . BPH with obstruction/lower urinary tract symptoms 04/10/2016  . Benign hypertension with CKD (chronic kidney disease) stage III ([place]) 04/10/2016  . Memory loss 04/10/2016  . Gastric outlet obstruction 12/02/2014  . Generalized abdominal pain   . Benign fibroma of prostate 01/02/2011  . HLD (hyperlipidemia) 01/02/2011  . Obesity (BMI 30.0-34.9) 01/02/2011  Care Plan : General Social Work (Adult)  Updates made by Greg Cutter, [name] since 07/09/2020 12:00 AM    Problem: Quality of Life (General Plan of Care)     Long-Range Goal: Quality of Life Maintained   Start Date: 06/19/2020   Priority: Medium  Note:   Timeframe:  Long-Range Goal Priority:  Medium Start Date:   07/09/20                   Expected End Date: 10/09/20                   Follow Up Date- 08/21/20  Current Barrers:  . Limited social support . ADL IADL limitations . Social Isolation . Limited access to caregiver . Inability to perform ADL's independently . Inability to perform IADL's independently . Lacks knowledge of community resource: personal care services  Clinical Social Work Clinical Goal(s):  Marland Kitchen Over the next 120 days, patient will work with SW to address concerns related to lack of care within the home  Interventions: . Patient and family interviewed and appropriate assessments performed . [name] and his wife are close church friends to patient for over 30 years and have been assisting him with his care. They live in [facility] but have been staying at patient's house. [name] number is [phone]. [name] took patient to PCP office visit today on 06/25/20 and picked up his medications. [name] wife is going to order patient some medical supplies which include a pill box for both morning and evenings. [name] was unaware of patient's urology appointment tomorrow and was appreciative of CCM [name]'s reminder. [name] and spouse will transport him there today on 06/26/20 at 1:30 pm. CCM [name] advised [name] to return CCM Pharmacist's call and he was agreeable to this plan. Update- [name] reports that patient continues to turn his phone off so patient's sister is going to patient's house today to assist him and turn his phone on. [name] reports that he and his wife continue to assist patient daily with his care giving needs. They were able to get all of his medications (besides the antibiotic for his foot) in a pill pack which will make medication management easier on the patient and family as a whole. [name] spouse has a few medication questions and wishes to contact CCM Pharmacist this  week for education and assistance. Per [name], patient is doing a lot better since he has been taking his medications as prescribed. [name] reports that patient is more active and is able to get around the house more. [name] reports that [facility] is involved as well.   . Past update-CCM [name] was unable to reach patient or spouse but successfully spoke with his sister on 06/19/20. Sister reports that patient is doing better and that they go to church together each Sunday. She reports that she cares a lot for her brother and is there for him when needed but she is 84 years old and has her own health concerns.   . Provided patient and family with information about available personal care services within the area such as C.H.O.R.E program. Patient and family feel that he will not be eligible for Medicaid. Patient is now on wait list for C.H.O.R.E . Discussed plans with patient for ongoing care management follow up and provided patient with direct contact information for care management team . Advised patient to considering hiring a private pay caregiver as Medicare does not cover these services . Assisted patient/caregiver with obtaining information  about health plan benefits . Provided education and assistance to client regarding Advanced Directives. . Provided education to patient/caregiver regarding level of care options. . Provided education to patient/caregiver about Hospice and/or Palliative Care services . [name] will place patient on wait list for C.H.O.R.E program during next scheduled CCM appointment---Application completed and patient was successfully added on the wait list for C.H.O.R.E program on 08/08/19. Patient was provided this update. Patient was provided Home Care Providers contact information in the case that he wishes to inquire where he is at on the wait list. Sister was provided this information as well on 06/19/20  Patient Self Care Activities:  . Attends all scheduled provider  appointments . Calls provider office for new concerns or questions . Lacks social connections . Unable to perform ADLs independently . Unable to perform IADLs independently  Please see past updates related to this goal by clicking on the "Past Updates" button in the selected goal       Follow Up Plan: SW will follow up with patient by phone over the next quarter      Eula Fried, [place], MSW, [email or web address] Phone: [phone]

## 2020-07-10 ENCOUNTER — Ambulatory Visit: Payer: Self-pay | Admitting: Urology

## 2020-07-11 ENCOUNTER — Telehealth: Payer: Self-pay | Admitting: Podiatry

## 2020-07-11 ENCOUNTER — Ambulatory Visit (HOSPITAL_COMMUNITY): Admission: RE | Admit: 2020-07-11 | Payer: Medicare Other | Source: Ambulatory Visit

## 2020-07-11 DIAGNOSIS — I69315 Cognitive social or emotional deficit following cerebral infarction: Secondary | ICD-10-CM | POA: Diagnosis not present

## 2020-07-11 DIAGNOSIS — I129 Hypertensive chronic kidney disease with stage 1 through stage 4 chronic kidney disease, or unspecified chronic kidney disease: Secondary | ICD-10-CM | POA: Diagnosis not present

## 2020-07-11 DIAGNOSIS — N183 Chronic kidney disease, stage 3 unspecified: Secondary | ICD-10-CM | POA: Diagnosis not present

## 2020-07-11 DIAGNOSIS — E785 Hyperlipidemia, unspecified: Secondary | ICD-10-CM | POA: Diagnosis not present

## 2020-07-11 DIAGNOSIS — L89612 Pressure ulcer of right heel, stage 2: Secondary | ICD-10-CM | POA: Diagnosis not present

## 2020-07-11 DIAGNOSIS — R300 Dysuria: Secondary | ICD-10-CM | POA: Diagnosis not present

## 2020-07-11 NOTE — Telephone Encounter (Signed)
[name] from [facility] called stating the pt's bandage came off and in order for her to re wrap it she needs an order sent over. Please advise.

## 2020-07-12 ENCOUNTER — Ambulatory Visit: Payer: Self-pay | Admitting: Pharmacist

## 2020-07-12 DIAGNOSIS — I10 Essential (primary) hypertension: Secondary | ICD-10-CM

## 2020-07-12 DIAGNOSIS — E782 Mixed hyperlipidemia: Secondary | ICD-10-CM | POA: Diagnosis not present

## 2020-07-12 DIAGNOSIS — I129 Hypertensive chronic kidney disease with stage 1 through stage 4 chronic kidney disease, or unspecified chronic kidney disease: Secondary | ICD-10-CM | POA: Diagnosis not present

## 2020-07-12 DIAGNOSIS — N138 Other obstructive and reflux uropathy: Secondary | ICD-10-CM | POA: Diagnosis not present

## 2020-07-12 DIAGNOSIS — N183 Chronic kidney disease, stage 3 unspecified: Secondary | ICD-10-CM | POA: Diagnosis not present

## 2020-07-12 NOTE — Patient Instructions (Signed)
Visit Information  PATIENT GOALS: Goals Addressed            This Visit's Progress   . Pharmacy Goals       Our goal bad cholesterol, or LDL, is less than 70 . This is why it is important to continue taking your atorvastatin.  Please check your home blood pressure, keep a log of the results and bring this with you to your medical appointments.  Feel free to call me with any questions or concerns. I look forward to our next call!    Harlow Asa, PharmD, [place] [phone]       The patient verbalized understanding of instructions, educational materials, and care plan provided today and declined offer to receive copy of patient instructions, educational materials, and care plan.   Telephone follow up appointment with care management team member scheduled for: 07/19/2020 10:30 AM

## 2020-07-12 NOTE — Chronic Care Management (AMB) (Signed)
Chronic Care Management Pharmacy Note  07/12/2020 Name:  [name] MRN:  [id number] DOB:  Feb 28, 1935  Subjective: [name] is an 86 y.o. year old male who is a primary patient of Jonathan Hauser, DO.  The CCM team was consulted for assistance with disease management and care coordination needs.    Engaged with patient and caregiver by telephone for follow up visit in response to provider referral for pharmacy case management and/or care coordination services.   Place coordination of care call to [name]  Consent to Services:  The patient was given information about Chronic Care Management services, agreed to services, and gave verbal consent prior to initiation of services.  Please see initial visit note for detailed documentation.   Patient Care Team: Jonathan Hauser, DO as PCP - General (Family Medicine) Jonathan Cutter, [name] as [place] Management (Licensed Clinical Social Worker) [name], Jonathan Putnam, RN as Case Manager ([name]) [name], [name], [name] as Pharmacist  Recent consult visits: Office Visit with [name] on 3/21  Nguyen visits: None in previous 6 months  Objective:  Lab Results  Component Value Date   CREATININE 1.25 (H) 06/13/2020   CREATININE 0.98 08/07/2018   CREATININE 1.14 09/18/2017    Lab Results  Component Value Date   HGBA1C 5.8 (H) 06/13/2020       Component Value Date/Time   CHOL 163 06/13/2020 1113   TRIG 100 06/13/2020 1113   HDL 37 (L) 06/13/2020 1113   CHOLHDL 4.4 06/13/2020 1113   VLDL 21 07/20/2016 0001   LDLCALC 106 (H) 06/13/2020 1113    Social History   Tobacco Use  Smoking Status Former Smoker  . Quit date: 73  . Years since quitting: 37.2  Smokeless Tobacco Former User   BP Readings from Last 3 Encounters:  06/26/20 (!) 160/74  06/25/20 140/84  06/13/20 (!) 151/78   Pulse Readings from Last 3 Encounters:  06/26/20 73  06/25/20 73  06/13/20 66    Wt Readings from Last 3 Encounters:  06/26/20 224 lb (101.6 kg)  06/25/20 222 lb 12.8 oz (101.1 kg)  06/13/20 226 lb 6.4 oz (102.7 kg)    Assessment: Review of patient past medical history, allergies, medications, health status, including review of consultants reports, laboratory and other test data, was performed as part of comprehensive evaluation and provision of chronic care management services.   SDOH:  (Social Determinants of Health) assessments and interventions performed: none   CCM Care Plan  No Known Allergies  Medications Reviewed Today    Reviewed by [name], [place] (Pharmacist) on 07/05/20 at 1242  Med List Status: <None>  Medication Order Taking? Sig Documenting Provider Last Dose Status Informant  aspirin EC 81 MG tablet [id number]  Take 1 tablet (81 mg total) by mouth in the morning. (blood thinner) Karamalegos, Devonne Doughty, DO  Active   atorvastatin (LIPITOR) 40 MG tablet [id number] Yes Take 1 tablet (40 mg total) by mouth at bedtime. (Cholesterol) Jonathan Hauser, DO Taking Active   clopidogrel (PLAVIX) 75 MG tablet [id number]  Take 1 tablet (75 mg total) by mouth in the morning. (blood thinner) Karamalegos, Devonne Doughty, DO  Active   levofloxacin (LEVAQUIN) 500 MG tablet [id number]  Take 500 mg by mouth daily. [provider]  Active   lisinopril (ZESTRIL) 20 MG tablet [id number]  Take 1 tablet (20 mg total) by mouth in the morning. (Blood pressure) [name], Devonne Doughty, DO  Active  terazosin (HYTRIN) 5 MG capsule [id number]  Take 1 capsule (5 mg total) by mouth at bedtime. (blood pressure, prostate) Jonathan Hauser, DO  Active           Patient Active Problem List   Diagnosis Date Noted  . Cognitive deficit as late effect of cerebrovascular accident (CVA) 06/25/2020  . Essential hypertension 06/25/2020  . Imbalance 02/01/2018  . Humeral head fracture, left, closed, initial encounter 09/22/2017  . History of cerebrovascular  accident (CVA) with residual deficit 09/09/2017  . CKD (chronic kidney disease), stage III ([place]) 09/01/2017  . Elevated PSA, between 10 and less than 20 ng/ml 07/22/2016  . Bilateral lower extremity edema 07/22/2016  . Abnormal glucose 07/15/2016  . BPH with obstruction/lower urinary tract symptoms 04/10/2016  . Benign hypertension with CKD (chronic kidney disease) stage III ([place]) 04/10/2016  . Memory loss 04/10/2016  . Gastric outlet obstruction 12/02/2014  . Generalized abdominal pain   . Benign fibroma of prostate 01/02/2011  . HLD (hyperlipidemia) 01/02/2011  . Obesity (BMI 30.0-34.9) 01/02/2011    Immunization History  Administered Date(s) Administered  . Fluad Quad(high Dose 65+) 12/14/2018  . Influenza, Seasonal, Injecte, Preservative Fre 02/07/2009  . Influenza-Unspecified 02/02/2002, 02/04/2004, 01/19/2007, 01/06/2008, 01/18/2010, 12/20/2010, 12/20/2014  . Pneumococcal Polysaccharide-23 02/27/2002, 08/31/2017  . Td 02/27/2002  . Tdap 09/08/2017    Conditions to be addressed/monitored: HTN and HLD  Care Plan : PharmD - Med Mgmt  Updates made by [name], RPH since 07/12/2020 12:00 AM    Problem: Disease Progression     Long-Range Goal: Disease Progression Prevented or Minimized   Start Date: 07/03/2020  Expected End Date: 10/01/2020  This Visit's Progress: On track  Recent Progress: On track  Priority: High  Note:   Current Barriers:  . Unable to self-administer medications as prescribed . Chronic Disease Management support and education needs related to memory loss and changes . Does not contact provider office for questions/concerns . Lack of blood pressure readings for clinical team  Pharmacist Clinical Goal(s):   Over the next 90 days, patient will achieve adherence to monitoring guidelines and medication adherence to achieve therapeutic efficacy through collaboration with PharmD and provider.   Interventions: . 1:1 collaboration with  Jonathan Hauser, DO regarding development and update of comprehensive plan of care as evidenced by provider attestation and co-signature . Inter-disciplinary care team collaboration (see longitudinal plan of care) . Today speak with both patient as well as patient's friend/caregiver, [name]  Medication Adherence: . Caregiver, [name], currently managing medications and filling weekly pillboxes for patient . Reports spoke with [name] this week regarding switching patient's medications to [name] for pill packaging o [name] reports on Monday will fill up weekly pillbox for patient and then bring current medication bottles to [name] for packaging, to plan to pick up and have patient start pill pack once done with week supply from pillbox o [name] reports patient has a couple of days of levofloxacin Rx from [place] remaining and will help patient take as directed by filling into weekly pillbox . Counsel caregiver to label pill pack with days of the week to aid with adherence . Place call to [place] today and speak with RPh Sam who reports has transferred patient's prescriptions from [facility]. o Review patient's medication list with RPh Sam o Requests office also fax copy of patient's medication list to [place] Nguyen [phone]). - Send message to office CMA team to request copy of latest medication list  be faxed  Hypertension: . Current treatment: o Lisinopril 20 mg QAM o Terazosin 5 mg QHS . Reports has a home wrist monitor o Discuss benefit of upper arm monitor over wrist monitor for greater accuracy . Counsel on BP monitoring technique . Encourage patient to monitor home blood pressure, keep log of results and have record to review during next telephone outreach  Patient Goals/Self-Care Activities  Over the next 90 days, patient will:  - focus on medication adherence by using weekly pillbox with assistance from/as filled by friend, [name]  Follow Up Plan: Telephone follow up appointment with care management team member scheduled for: 07/19/2020 10:30 AM      Medication Assistance: None required.  Patient affirms current coverage meets needs.  Patient's preferred pharmacy is:  [facility] - [address], [place] [address] Phone: [phone] Fax: [phone]  [facility] [address] [place] - [place] [address] [place] [address] Phone: [phone] Fax: [phone]  [facility] [place], [place] [address] [place] [address] Phone: [phone] Fax: [phone]  [place], [place], [place], [place] [address] Phone: [phone] Fax: [place], [place] [address] Phone: [phone] Fax: [place], [place]. [place] [address] Phone: [phone] Fax: [phone]  Uses pill box? Yes  Follow Up:  Patient agrees to Care Plan and Follow-up.  Plan: Telephone follow up appointment with care management team member scheduled for:  07/19/2020 10:30 AM  Harlow Asa, PharmD, [place], [place] [phone]

## 2020-07-15 ENCOUNTER — Other Ambulatory Visit: Payer: Self-pay

## 2020-07-15 ENCOUNTER — Encounter: Payer: Medicare Other | Admitting: Physician Assistant

## 2020-07-15 DIAGNOSIS — R296 Repeated falls: Secondary | ICD-10-CM | POA: Diagnosis not present

## 2020-07-15 DIAGNOSIS — Z7901 Long term (current) use of anticoagulants: Secondary | ICD-10-CM | POA: Diagnosis not present

## 2020-07-15 DIAGNOSIS — L89610 Pressure ulcer of right heel, unstageable: Secondary | ICD-10-CM | POA: Diagnosis not present

## 2020-07-15 DIAGNOSIS — L89890 Pressure ulcer of other site, unstageable: Secondary | ICD-10-CM | POA: Diagnosis not present

## 2020-07-15 NOTE — Progress Notes (Addendum)
TAKARI, LUNDAHL ([id number]) Visit Report for 07/15/2020 Chief Complaint Document Details [name], [name] Date of Service: 07/15/2020 11:00 AM Medical Record Number: [id number] Patient Account Number: [id number] Date of Birth/Sex: February 03, 1935 (85 y.o. M) Treating RN: Carlene Coria Primary Care Provider: Nobie Putnam Other Clinician: Jeanine Luz Referring Provider: Nobie Putnam Treating Provider/Extender: Skipper Cliche in Treatment: 6 Information Obtained from: Patient Chief Complaint Right heel ulcer Electronic Signature(s) Signed: 07/15/2020 11:27:46 AM By: Worthy Keeler PA-C Entered By: Worthy Keeler on 07/15/2020 11:27:46 Kolenda, [name] ([id number]) -------------------------------------------------------------------------------- HPI Details [name] Date of Service: 07/15/2020 11:00 AM Medical Record Number: [id number] Patient Account Number: [id number] Date of Birth/Sex: 01/09/1935 (85 y.o. M) Treating RN: Carlene Coria Primary Care Provider: Nobie Putnam Other Clinician: Jeanine Luz Referring Provider: Nobie Putnam Treating Provider/Extender: Skipper Cliche in Treatment: 6 History of Present Illness HPI Description: 06/03/2020 upon evaluation today patient actually presents for initial inspection here in our clinic after having gone to urgent care this morning where they were concerned about infection as well as a significant "softball sized wound with eschar" over the heel. With that being said the patient fortunately does not have an area that largely does have an area of eschar which is draining around the edges really not stable and I think we are getting to remove the eschar so we try to clean up the surface of the wound. Fortunately there is no evidence of active infection at this time. I do believe that he is going require some debridement both manually today with scissors and forceps as well  as enzymatic/chemically with the medications. I really feel like that he has a good chance to get this to heal if he keep pressure off of it we did discuss that again today. He does note this has been present for about 2 months. He is not taking any of his current medications that we did put him on the list he tells me that he only occasionally takes them which is obviously not doing anything for him. He does have a cane but he does not use it and his ABI was 1.14 on the right. 06/10/2020 upon evaluation today patient appears to be doing well with regard to his wound all things considered. He still has a lot of necrotic tissue in the central portion of the wound. I think the Iodosorb is still the right thing to do and again the biggest issue is he quit using it as he and his wife felt like that the drainage following putting on the medicine was really "nasty". Nonetheless I explained that the weight Iodoflex breaks down or Iodosorb and that round does make it look really bad but it actually is not as bad as what it seems. In fact it is the normal course of things. 06/24/2020 upon evaluation today patient appears to be doing unfortunately about the same in regard to his heel. I do not think this is any worse. However its become increasingly clear that he has significant dementia in my opinion. We have had multiple instances going on here from the standpoint of trying to get him to take the oral antibiotic. Initially we ordered it for him he picked it up on 1 afternoon and by the next morning we called to make sure that he had gotten it he told us yes but that he had thrown it away. That he was not to take any additional medications. We then told him he needed  to be taken this to get the infection under control he subsequently look for it only found the bottle never took the medication therefore I sent it in a second time for him. After sending this and it looks like he has not really taken it since that  time he tells me that he took 7 pills at once although I do not think they were all of the same medication and then following this it made him sick I think these were all of his different medicines. He decided he was not to take any further medication after that point. Nonetheless I do believe he still is going require treatment for this infection the good news is he has a friend who is a very good friend that is with him here today to try to help with figuring out what to do and how going forward this should be taken care of. 07/01/2020 upon evaluation today patient appears to be doing really about the same. His measurements are maybe slightly smaller than previous. Fortunately there is no signs of active infection which is great news. No fevers, chills, nausea, vomiting, or diarrhea. 07/08/2020 upon evaluation today patient appears to be doing better in regard to his wounds. He looks like he finally actually took the Levaquin which has helped with the infection. I think things are doing much better in that regard which is great news. No fevers, chills, nausea, vomiting, or diarrhea. Unfortunately he did have actual Kleenex/tissue paper over the wound on really not sure why his wife put that on there but nonetheless I think were ready to start wrapping this week. 07/15/2020 on evaluation today patient appears to be doing about the same in regard to his wound. Unfortunately there does not appear to be any significant improvement. With that being said he also came in today with the heel of his wrap completely hanging out where the wound was exposed and just rubbing on the bedroom slipper that he has on today. With that being said this is obviously not ideal. I discussed that with the patient I really think that he is getting need to be more careful and conscientious about what is going on with his wrap. With that being said some of this is to know followed on his own that he actually has in my opinion  significant dementia which inhibits his ability to be able to comply. He has a friend with him today who is trying to help out as best he can I am just not certain that Mr. Jedlicka is actually able to do what needs to be done here. Electronic Signature(s) Signed: 07/15/2020 3:45:37 PM By: Worthy Keeler PA-C Entered By: Worthy Keeler on 07/15/2020 15:45:37 Batie, [name] ([id number]) -------------------------------------------------------------------------------- Physical Exam Details [name] Date of Service: 07/15/2020 11:00 AM Medical Record Number: [id number] Patient Account Number: [id number] Date of Birth/Sex: Jul 27, 1934 (85 y.o. M) Treating RN: Carlene Coria Primary Care Provider: Nobie Putnam Other Clinician: Jeanine Luz Referring Provider: Nobie Putnam Treating Provider/Extender: Skipper Cliche in Treatment: 6 Constitutional Well-nourished and well-hydrated in no acute distress. Respiratory normal breathing without difficulty. Psychiatric this patient is able to make decisions and demonstrates good insight into disease process. Alert and Oriented x 3. pleasant and cooperative. Notes Upon inspection patient's wound bed actually showed signs of some good granulation. Does not appear to show any signs of significant infection which is great news. The best news is he actually does have a home health nurse that  will be coming out to help take care of him starting this week it sounds like. This is with [facility]. Electronic Signature(s) Signed: 07/15/2020 3:46:02 PM By: Worthy Keeler PA-C Entered By: Worthy Keeler on 07/15/2020 15:46:02 Cleary, [name] ([id number]) -------------------------------------------------------------------------------- Physician Orders Details [name] Date of Service: 07/15/2020 11:00 AM Medical Record Number: [id number] Patient Account Number: [id number] Date of Birth/Sex: 21-Dec-1934 (85  y.o. M) Treating RN: Carlene Coria Primary Care Provider: Nobie Putnam Other Clinician: Jeanine Luz Referring Provider: Nobie Putnam Treating Provider/Extender: Skipper Cliche in Treatment: 6 Verbal / Phone Orders: No Diagnosis Coding ICD-10 Coding Code Description L89.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, unstageable R29.6 Repeated falls Z79.01 Long term (current) use of anticoagulants Follow-up Appointments o Return Appointment in 1 week. [place] for wound care. May utilize formulary equivalent dressing for wound treatment orders unless otherwise specified. Home Health Nurse may visit [facility] to address patientos wound care needs. - [facility] to change on Wednesdays and Fridays , patient to be seen in clinic on mondays Edema Control - Lymphedema / Segmental Compressive Device / Other o Elevate, Exercise Daily and Avoid Standing for Long Periods of Time. o Elevate legs to the level of the heart and pump ankles as often as possible o Elevate leg(s) parallel to the floor when sitting. Off-Loading o Open toe surgical shoe - right foot Wound Treatment Wound #1 - Foot Wound Laterality: Right, Lateral Cleanser: Byram Ancillary Kit - 15 Day Supply (Generic) 3 x Per Week/30 Days Discharge Instructions: Use supplies as instructed; Kit contains: (15) Saline Bullets; (15) 3x3 Gauze; 15 pr Gloves Cleanser: Normal Saline (Generic) 3 x Per Week/30 Days Discharge Instructions: Wash your hands with soap and water. Remove old dressing, discard into plastic bag and place into trash. Cleanse the wound with Normal Saline prior to applying a clean dressing using gauze sponges, not tissues or cotton balls. Do not scrub or use excessive force. Pat dry using gauze sponges, not tissue or cotton balls. Primary Dressing: Hydrofera Blue Ready Transfer Foam, 2.5x2.5 (in/in) (Generic) 3 x Per Week/30 Days Discharge Instructions: Apply Hydrofera Blue  Ready to wound bed as directed Secondary Dressing: ABD Pad 5x9 (in/in) (Generic) 3 x Per Week/30 Days Discharge Instructions: Cover with ABD pad Compression Wrap: Profore Lite LF 3 Multilayer Compression Bandaging System (Generic) 3 x Per Week/30 Days Discharge Instructions: Apply 3 multi-layer wrap as prescribed. Electronic Signature(s) Signed: 07/15/2020 5:08:01 PM By: Carlene Coria RN Signed: 07/15/2020 5:17:55 PM By: Worthy Keeler PA-C Entered By: Carlene Coria on 07/15/2020 11:43:23 Egloff, [name] ([id number]) -------------------------------------------------------------------------------- Problem List Details [name] Date of Service: 07/15/2020 11:00 AM Medical Record Number: [id number] Patient Account Number: [id number] Date of Birth/Sex: 07-29-1934 (85 y.o. M) Treating RN: Carlene Coria Primary Care Provider: Nobie Putnam Other Clinician: Jeanine Luz Referring Provider: Nobie Putnam Treating Provider/Extender: Skipper Cliche in Treatment: 6 Active Problems ICD-10 Encounter Code Description Active Date MDM Diagnosis L89.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, unstageable 06/03/2020 No Yes R29.6 Repeated falls 06/03/2020 No Yes Z79.01 Long term (current) use of anticoagulants 06/03/2020 No Yes Inactive Problems Resolved Problems Electronic Signature(s) Signed: 07/15/2020 11:27:40 AM By: Worthy Keeler PA-C Entered By: Worthy Keeler on 07/15/2020 11:27:40 Braddock, [name] ([id number]) -------------------------------------------------------------------------------- Progress Note Details [name] Date of Service: 07/15/2020 11:00 AM Medical Record Number: [id number] Patient Account Number: [id number] Date of Birth/Sex: 1934/09/06 (85 y.o. M) Treating RN: Carlene Coria Primary  Care Provider: Nobie Putnam Other Clinician: Jeanine Luz Referring Provider: Nobie Putnam Treating Provider/Extender: Skipper Cliche in Treatment: 6 Subjective Chief Complaint Information obtained from Patient Right heel ulcer History of Present Illness (HPI) 06/03/2020 upon evaluation today patient actually presents for initial inspection here in our clinic after having gone to urgent care this morning where they were concerned about infection as well as a significant "softball sized wound with eschar" over the heel. With that being said the patient fortunately does not have an area that largely does have an area of eschar which is draining around the edges really not stable and I think we are getting to remove the eschar so we try to clean up the surface of the wound. Fortunately there is no evidence of active infection at this time. I do believe that he is going require some debridement both manually today with scissors and forceps as well as enzymatic/chemically with the medications. I really feel like that he has a good chance to get this to heal if he keep pressure off of it we did discuss that again today. He does note this has been present for about 2 months. He is not taking any of his current medications that we did put him on the list he tells me that he only occasionally takes them which is obviously not doing anything for him. He does have a cane but he does not use it and his ABI was 1.14 on the right. 06/10/2020 upon evaluation today patient appears to be doing well with regard to his wound all things considered. He still has a lot of necrotic tissue in the central portion of the wound. I think the Iodosorb is still the right thing to do and again the biggest issue is he quit using it as he and his wife felt like that the drainage following putting on the medicine was really "nasty". Nonetheless I explained that the weight Iodoflex breaks down or Iodosorb and that round does make it look really bad but it actually is not as bad as what it seems. In fact it is the normal course of things. 06/24/2020 upon  evaluation today patient appears to be doing unfortunately about the same in regard to his heel. I do not think this is any worse. However its become increasingly clear that he has significant dementia in my opinion. We have had multiple instances going on here from the standpoint of trying to get him to take the oral antibiotic. Initially we ordered it for him he picked it up on 1 afternoon and by the next morning we called to make sure that he had gotten it he told us yes but that he had thrown it away. That he was not to take any additional medications. We then told him he needed to be taken this to get the infection under control he subsequently look for it only found the bottle never took the medication therefore I sent it in a second time for him. After sending this and it looks like he has not really taken it since that time he tells me that he took 7 pills at once although I do not think they were all of the same medication and then following this it made him sick I think these were all of his different medicines. He decided he was not to take any further medication after that point. Nonetheless I do believe he still is going require treatment for this infection the good news is he  has a friend who is a very good friend that is with him here today to try to help with figuring out what to do and how going forward this should be taken care of. 07/01/2020 upon evaluation today patient appears to be doing really about the same. His measurements are maybe slightly smaller than previous. Fortunately there is no signs of active infection which is great news. No fevers, chills, nausea, vomiting, or diarrhea. 07/08/2020 upon evaluation today patient appears to be doing better in regard to his wounds. He looks like he finally actually took the Levaquin which has helped with the infection. I think things are doing much better in that regard which is great news. No fevers, chills, nausea, vomiting,  or diarrhea. Unfortunately he did have actual Kleenex/tissue paper over the wound on really not sure why his wife put that on there but nonetheless I think were ready to start wrapping this week. 07/15/2020 on evaluation today patient appears to be doing about the same in regard to his wound. Unfortunately there does not appear to be any significant improvement. With that being said he also came in today with the heel of his wrap completely hanging out where the wound was exposed and just rubbing on the bedroom slipper that he has on today. With that being said this is obviously not ideal. I discussed that with the patient I really think that he is getting need to be more careful and conscientious about what is going on with his wrap. With that being said some of this is to know followed on his own that he actually has in my opinion significant dementia which inhibits his ability to be able to comply. He has a friend with him today who is trying to help out as best he can I am just not certain that Mr. Bogacz is actually able to do what needs to be done here. Objective Constitutional Well-nourished and well-hydrated in no acute distress. Vitals Time Taken: 11:09 AM, Height: 71 in, Weight: 216 lbs, BMI: 30.1, Temperature: 97.7 F, Pulse: 75 bpm, Respiratory Rate: 18 breaths/min, Blood Pressure: 143/73 mmHg. TRAY, KLAYMAN ([id number]) Respiratory normal breathing without difficulty. Psychiatric this patient is able to make decisions and demonstrates good insight into disease process. Alert and Oriented x 3. pleasant and cooperative. General Notes: Upon inspection patient's wound bed actually showed signs of some good granulation. Does not appear to show any signs of significant infection which is great news. The best news is he actually does have a home health nurse that will be coming out to help take care of him starting this week it sounds like. This is with [facility]. Integumentary (Hair,  Skin) Wound #1 status is Open. Original cause of wound was Pressure Injury. The date acquired was: 04/03/2020. The wound has been in treatment 6 weeks. The wound is located on the Right,Lateral Foot. The wound measures 5.2cm length x 4.4cm width x 0.2cm depth; 17.97cm^2 area and 3.594cm^3 volume. There is Fat Layer (Subcutaneous Tissue) exposed. There is no tunneling or undermining noted. There is a medium amount of serosanguineous drainage noted. The wound margin is thickened. There is medium (34-66%) red, pink, hyper - granulation within the wound bed. There is a medium (34-66%) amount of necrotic tissue within the wound bed including Adherent Slough. Assessment Active Problems ICD-10 Pressure ulcer of right heel, unstageable Repeated falls Long term (current) use of anticoagulants Procedures Wound #1 Pre-procedure diagnosis of Wound #1 is a Pressure Ulcer located on the Right,Lateral Foot .  There was a Three Layer Compression Therapy Procedure by Carlene Coria, RN. Post procedure Diagnosis Wound #1: Same as Pre-Procedure Plan Follow-up Appointments: Return Appointment in 1 week. Home Health: [facility] for wound care. May utilize formulary equivalent dressing for wound treatment orders unless otherwise specified. Home Health Nurse may visit PRN to address patient s wound care needs. - [facility] to change on Wednesdays and Fridays , patient to be seen in clinic on mondays Edema Control - Lymphedema / Segmental Compressive Device / Other: Elevate, Exercise Daily and Avoid Standing for Long Periods of Time. Elevate legs to the level of the heart and pump ankles as often as possible Elevate leg(s) parallel to the floor when sitting. Off-Loading: Open toe surgical shoe - right foot WOUND #1: - Foot Wound Laterality: Right, Lateral Cleanser: Byram Ancillary Kit - 15 Day Supply (Generic) 3 x Per Week/30 Days Discharge Instructions: Use supplies as instructed; Kit contains: (15)  Saline Bullets; (15) 3x3 Gauze; 15 pr Gloves Cleanser: Normal Saline (Generic) 3 x Per Week/30 Days Discharge Instructions: Wash your hands with soap and water. Remove old dressing, discard into plastic bag and place into trash. Cleanse the wound with Normal Saline prior to applying a clean dressing using gauze sponges, not tissues or cotton balls. Do not scrub or use excessive force. Pat dry using gauze sponges, not tissue or cotton balls. Primary Dressing: Hydrofera Blue Ready Transfer Foam, 2.5x2.5 (in/in) (Generic) 3 x Per Week/30 Days Discharge Instructions: Apply Hydrofera Blue Ready to wound bed as directed Secondary Dressing: ABD Pad 5x9 (in/in) (Generic) 3 x Per Week/30 Days Discharge Instructions: Cover with ABD pad Creighton, Yassir D. ([id number]) Compression Wrap: Profore Lite LF 3 Multilayer Compression Bandaging System (Generic) 3 x Per Week/30 Days Discharge Instructions: Apply 3 multi-layer wrap as prescribed. 1. Would recommend currently that we going to continue with the wound care measures as before and the patient is in agreement with the plan this includes the use of the [facility] dressing which I think is still good to be the best way to go. 2. Also can recommend an ABD pad. 3. I am also going to suggest 3 layer compression wrap which hopefully should help control edema. We will see patient back for reevaluation in 1 week here in the clinic. If anything worsens or changes patient will contact our office for additional recommendations. Hopefully with the addition of home health helping to take care of this patient will be able to keep the wrap in place and intact and doing well. We will see where things stand next week. Electronic Signature(s) Signed: 07/15/2020 3:46:41 PM By: Worthy Keeler PA-C Entered By: Worthy Keeler on 07/15/2020 15:46:41 Stang, [name] ([id number]) -------------------------------------------------------------------------------- SuperBill  Details [name] Date of Service: 07/15/2020 Medical Record Number: [id number] Patient Account Number: [id number] Date of Birth/Sex: 1934-12-31 (86 y.o. M) Treating RN: Carlene Coria Primary Care Provider: Nobie Putnam Other Clinician: Jeanine Luz Referring Provider: Nobie Putnam Treating Provider/Extender: Skipper Cliche in Treatment: 6 Diagnosis Coding ICD-10 Codes Code Description L89.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, unstageable R29.6 Repeated falls Z79.01 Long term (current) use of anticoagulants Facility Procedures CPT4 Code: [id number] Description: (Facility Use Only) [phone] - APPLY MULTLAY COMPRS LWR RT LEG Modifier: Quantity: 1 Physician Procedures CPT4 Code: [id number] Description: 80165 - WC PHYS LEVEL 3 - EST PT Modifier: Quantity: 1 CPT4 Code: Description: ICD-10 Diagnosis Description L89.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, unstageable R29.6 Repeated falls Z79.01 Long term (current) use  of anticoagulants Modifier: Quantity: Electronic Signature(s) Signed: 07/15/2020 3:46:54 PM By: Worthy Keeler PA-C Entered By: Worthy Keeler on 07/15/2020 15:46:53

## 2020-07-15 NOTE — Progress Notes (Signed)
LORENZ, DONLEY ([id number]) Visit Report for 07/15/2020 Arrival Information Details [name], [name] Date of Service: 07/15/2020 11:00 AM Medical Record Number: [id number] Patient Account Number: [id number] Date of Birth/Sex: [birth year] (85 y.o. M) Treating RN: Carlene Coria Primary Care Annison Birchard: Nobie Putnam Other Clinician: Jeanine Luz Referring Kiegan Macaraeg: Nobie Putnam Treating Robinson Brinkley/Extender: Skipper Cliche in Treatment: 6 Visit Information History Since Last Visit Added or deleted any medications: No Patient Arrived: Ambulatory Had a fall or experienced change in No Arrival Time: 11:09 activities of daily living that may affect Accompanied By: self risk of falls: Transfer Assistance: None Signs or symptoms of abuse/neglect since last visito No Patient Identification Verified: Yes Hospitalized since last visit: No Secondary Verification Process Completed: Yes Pain Present Now: No Patient Has Alerts: Yes Patient Alerts: Not Diabetic Plavix Electronic Signature(s) Signed: 07/15/2020 4:31:30 PM By: Jeanine Luz Entered By: Jeanine Luz on 07/15/2020 11:10:03 [name], [name] ([id number]) -------------------------------------------------------------------------------- Clinic Level of Care Assessment Details [name] Date of Service: 07/15/2020 11:00 AM Medical Record Number: [id number] Patient Account Number: [id number] Date of Birth/Sex: 1935/01/26 (85 y.o. M) Treating RN: Carlene Coria Primary Care Willie Loy: Nobie Putnam Other Clinician: Jeanine Luz Referring Marykathleen Russi: Nobie Putnam Treating Ilai Hiller/Extender: Skipper Cliche in Treatment: 6 Clinic Level of Care Assessment Items TOOL 1 Quantity Score '[]'  - Use when EandM and Procedure is performed on INITIAL visit 0 ASSESSMENTS - Nursing Assessment / Reassessment '[]'  - General Physical Exam (combine w/ comprehensive assessment (listed just  below) when performed on new 0 pt. evals) '[]'  - 0 Comprehensive Assessment (HX, ROS, Risk Assessments, Wounds Hx, etc.) ASSESSMENTS - Wound and Skin Assessment / Reassessment '[]'  - Dermatologic / Skin Assessment (not related to wound area) 0 ASSESSMENTS - Ostomy and/or Continence Assessment and Care '[]'  - Incontinence Assessment and Management 0 '[]'  - 0 Ostomy Care Assessment and Management (repouching, etc.) PROCESS - Coordination of Care '[]'  - Simple Patient / Family Education for ongoing care 0 '[]'  - 0 Complex (extensive) Patient / Family Education for ongoing care '[]'  - 0 Staff obtains Programmer, systems, Records, Test Results / Process Orders '[]'  - 0 Staff telephones HHA, Nursing Homes / Clarify orders / etc '[]'  - 0 Routine Transfer to another Facility (non-emergent condition) '[]'  - 0 Routine Hospital Admission (non-emergent condition) '[]'  - 0 New Admissions / Biomedical engineer / Ordering NPWT, Apligraf, etc. '[]'  - 0 Emergency Hospital Admission (emergent condition) PROCESS - Special Needs '[]'  - Pediatric / Minor Patient Management 0 '[]'  - 0 Isolation Patient Management '[]'  - 0 Hearing / Language / Visual special needs '[]'  - 0 Assessment of Community assistance (transportation, D/C planning, etc.) '[]'  - 0 Additional assistance / Altered mentation '[]'  - 0 Support Surface(s) Assessment (bed, cushion, seat, etc.) INTERVENTIONS - Miscellaneous '[]'  - External ear exam 0 '[]'  - 0 Patient Transfer (multiple staff / Civil Service fast streamer / Similar devices) '[]'  - 0 Simple Staple / Suture removal (25 or less) '[]'  - 0 Complex Staple / Suture removal (26 or more) '[]'  - 0 Hypo/Hyperglycemic Management (do not check if billed separately) '[]'  - 0 Ankle / Brachial Index (ABI) - do not check if billed separately Has the patient been seen at the hospital within the last three years: Yes Total Score: 0 Level Of Care: ____ [name] ([id number]) Electronic Signature(s) Signed: 07/15/2020 5:08:01 PM By: Carlene Coria RN Entered By: Carlene Coria on 07/15/2020 11:44:11 [name], [name] ([id number]) -------------------------------------------------------------------------------- Compression Therapy Details [name] Date  of Service: 07/15/2020 11:00 AM Medical Record Number: [id number] Patient Account Number: [id number] Date of Birth/Sex: [birth year] (85 y.o. M) Treating RN: Carlene Coria Primary Care Reedy Biernat: Nobie Putnam Other Clinician: Jeanine Luz Referring Keiandra Sullenger: Nobie Putnam Treating Geoffery Aultman/Extender: Skipper Cliche in Treatment: 6 Compression Therapy Performed for Wound Assessment: Wound #1 Right,Lateral Foot Performed By: Clinician Carlene Coria, RN Compression Type: Three Layer Post Procedure Diagnosis Same as Pre-procedure Electronic Signature(s) Signed: 07/15/2020 5:08:01 PM By: Carlene Coria RN Entered By: Carlene Coria on 07/15/2020 11:38:53 [name], [name] ([id number]) -------------------------------------------------------------------------------- Encounter Discharge Information Details [name] Date of Service: 07/15/2020 11:00 AM Medical Record Number: [id number] Patient Account Number: [id number] Date of Birth/Sex: [birth year] (85 y.o. M) Treating RN: Dolan Amen Primary Care Vashti Bolanos: Nobie Putnam Other Clinician: Jeanine Luz Referring Kalvyn Desa: Nobie Putnam Treating Lehi Phifer/Extender: Skipper Cliche in Treatment: 6 Encounter Discharge Information Items Discharge Condition: Stable Ambulatory Status: Ambulatory Discharge Destination: Home Transportation: Private Auto Accompanied By: self Schedule Follow-up Appointment: Yes Clinical Summary of Care: Electronic Signature(s) Signed: 07/15/2020 5:07:34 PM By: Georges Mouse, Minus Breeding RN Entered By: Georges Mouse, Minus Breeding on 07/15/2020 11:58:43 [name], [name]  ([id number]) -------------------------------------------------------------------------------- Lower Extremity Assessment Details [name] Date of Service: 07/15/2020 11:00 AM Medical Record Number: [id number] Patient Account Number: [id number] Date of Birth/Sex: 14-May-1934 (85 y.o. M) Treating RN: Carlene Coria Primary Care Jj Enyeart: Nobie Putnam Other Clinician: Jeanine Luz Referring Brynne Doane: Nobie Putnam Treating Chou Busler/Extender: Skipper Cliche in Treatment: 6 Edema Assessment Assessed: [Left: No] [Right: Yes] Edema: [Left: Ye] [Right: s] Calf Left: Right: Point of Measurement: 31 cm From Medial Instep 36.5 cm Ankle Left: Right: Point of Measurement: 12 cm From Medial Instep 25 cm Vascular Assessment Pulses: Dorsalis Pedis Palpable: [Right:Yes] Electronic Signature(s) Signed: 07/15/2020 4:31:30 PM By: Jeanine Luz Signed: 07/15/2020 5:08:01 PM By: Carlene Coria RN Entered By: Jeanine Luz on 07/15/2020 11:26:14 Rief, Jonathan D. ([id number]) -------------------------------------------------------------------------------- Multi Wound Chart Details Patient Name: [name] Date of Service: 07/15/2020 11:00 AM Medical Record Number: [id number] Patient Account Number: [id number] Date of Birth/Sex: 1934-07-27 (85 y.o. M) Treating RN: Carlene Coria Primary Care Isacc Turney: Nobie Putnam Other Clinician: Jeanine Luz Referring Gudrun Axe: Nobie Putnam Treating Toma Arts/Extender: Skipper Cliche in Treatment: 6 Vital Signs Height(in): 71 Pulse(bpm): 75 Weight(lbs): 216 Blood Pressure(mmHg): 143/73 Body Mass Index(BMI): 30 Temperature(F): 97.7 Respiratory Rate(breaths/min): 18 Photos: [N/A:N/A] Wound Location: Right, Lateral Foot N/A N/A Wounding Event: Pressure Injury N/A N/A Primary Etiology: Pressure Ulcer N/A N/A Comorbid History: Cataracts, Hypertension N/A N/A Date Acquired: 04/03/2020 N/A  N/A Weeks of Treatment: 6 N/A N/A Wound Status: Open N/A N/A Measurements L x W x D (cm) 5.2x4.4x0.2 N/A N/A Area (cm) : 17.97 N/A N/A Volume (cm) : 3.594 N/A N/A % Reduction in Area: 19.90% N/A N/A % Reduction in Volume: 68.00% N/A N/A Classification: Unstageable/Unclassified N/A N/A Exudate Amount: Medium N/A N/A Exudate Type: Serosanguineous N/A N/A Exudate Color: red, brown N/A N/A Wound Margin: Thickened N/A N/A Granulation Amount: Medium (34-66%) N/A N/A Granulation Quality: Red, Pink, Hyper-granulation N/A N/A Necrotic Amount: Medium (34-66%) N/A N/A Exposed Structures: Fat Layer (Subcutaneous Tissue): N/A N/A Yes Fascia: No Tendon: No Muscle: No Joint: No Bone: No Epithelialization: None N/A N/A Treatment Notes Electronic Signature(s) Signed: 07/15/2020 5:08:01 PM By: Carlene Coria RN Entered By: Carlene Coria on 07/15/2020 11:37:33 Wedge, [name] ([id number]) -------------------------------------------------------------------------------- Multi-Disciplinary Care Plan Details [name] Date of Service: 07/15/2020 11:00 AM Medical Record Number: [id number] Patient Account Number: [id number] Date of  Birth/Sex: 11-12-1934 (86 y.o. M) Treating RN: Carlene Coria Primary Care Deaira Leckey: Nobie Putnam Other Clinician: Jeanine Luz Referring Helmuth Recupero: Nobie Putnam Treating Jaklyn Alen/Extender: Skipper Cliche in Treatment: 6 Active Inactive Wound/Skin Impairment Nursing Diagnoses: Knowledge deficit related to ulceration/compromised skin integrity Goals: Patient/caregiver will verbalize understanding of skin care regimen Date Initiated: 06/03/2020 Target Resolution Date: 08/01/2020 Goal Status: Active Ulcer/skin breakdown will have a volume reduction of 30% by week 4 Date Initiated: 06/03/2020 Date Inactivated: 07/01/2020 Target Resolution Date: 07/01/2020 Goal Status: Met Ulcer/skin breakdown will have a volume reduction of 50% by  week 8 Date Initiated: 07/01/2020 Target Resolution Date: 08/01/2020 Goal Status: Active Ulcer/skin breakdown will have a volume reduction of 80% by week 12 Date Initiated: 07/01/2020 Target Resolution Date: 08/31/2020 Goal Status: Active Ulcer/skin breakdown will heal within 14 weeks Date Initiated: 07/01/2020 Target Resolution Date: 10/01/2020 Goal Status: Active Interventions: Assess patient/caregiver ability to obtain necessary supplies Assess patient/caregiver ability to perform ulcer/skin care regimen upon admission and as needed Assess ulceration(s) every visit Notes: Electronic Signature(s) Signed: 07/15/2020 5:08:01 PM By: Carlene Coria RN Entered By: Carlene Coria on 07/15/2020 11:37:24 Lippman, [name] ([id number]) -------------------------------------------------------------------------------- Pain Assessment Details [name] Date of Service: 07/15/2020 11:00 AM Medical Record Number: [id number] Patient Account Number: [id number] Date of Birth/Sex: 1934/11/03 (85 y.o. M) Treating RN: Carlene Coria Primary Care Zyairah Wacha: Nobie Putnam Other Clinician: Jeanine Luz Referring Humphrey Guerreiro: Nobie Putnam Treating Emara Lichter/Extender: Skipper Cliche in Treatment: 6 Active Problems Location of Pain Severity and Description of Pain Patient Has Paino No Site Locations Rate the pain. Current Pain Level: 0 Pain Management and Medication Current Pain Management: Electronic Signature(s) Signed: 07/15/2020 4:31:30 PM By: Jeanine Luz Signed: 07/15/2020 5:08:01 PM By: Carlene Coria RN Entered By: Jeanine Luz on 07/15/2020 11:10:31 Spahr, [name] ([id number]) -------------------------------------------------------------------------------- Patient/Caregiver Education Details [name] Date of Service: 07/15/2020 11:00 AM Medical Record Number: [id number] Patient Account Number: [id number] Date of Birth/Gender: [birth year] (85 y.o.  M) Treating RN: Carlene Coria Primary Care Physician: Nobie Putnam Other Clinician: Jeanine Luz Referring Physician: Nobie Putnam Treating Physician/Extender: Skipper Cliche in Treatment: 6 Education Assessment Education Provided To: Patient Education Topics Provided Wound/Skin Impairment: Methods: Explain/Verbal Responses: State content correctly Electronic Signature(s) Signed: 07/15/2020 5:08:01 PM By: Carlene Coria RN Entered By: Carlene Coria on 07/15/2020 11:44:32 [name], [name] ([id number]) -------------------------------------------------------------------------------- Wound Assessment Details [name] Date of Service: 07/15/2020 11:00 AM Medical Record Number: [id number] Patient Account Number: [id number] Date of Birth/Sex: 17-Aug-1934 (85 y.o. M) Treating RN: Carlene Coria Primary Care Kapil Petropoulos: Nobie Putnam Other Clinician: Jeanine Luz Referring Nicklaus Alviar: Nobie Putnam Treating Jori Thrall/Extender: Skipper Cliche in Treatment: 6 Wound Status Wound Number: 1 Primary Etiology: Pressure Ulcer Wound Location: Right, Lateral Foot Wound Status: Open Wounding Event: Pressure Injury Comorbid History: Cataracts, Hypertension Date Acquired: 04/03/2020 Weeks Of Treatment: 6 Clustered Wound: No Photos Wound Measurements Length: (cm) 5.2 Width: (cm) 4.4 Depth: (cm) 0.2 Area: (cm) 17.97 Volume: (cm) 3.594 % Reduction in Area: 19.9% % Reduction in Volume: 68% Epithelialization: None Tunneling: No Undermining: No Wound Description Classification: Unstageable/Unclassified Wound Margin: Thickened Exudate Amount: Medium Exudate Type: Serosanguineous Exudate Color: red, brown Foul Odor After Cleansing: No Slough/Fibrino Yes Wound Bed Granulation Amount: Medium (34-66%) Exposed Structure Granulation Quality: Red, Pink, Hyper-granulation Fascia Exposed: No Necrotic Amount: Medium (34-66%) Fat Layer  (Subcutaneous Tissue) Exposed: Yes Necrotic Quality: Adherent Slough Tendon Exposed: No Muscle Exposed: No Joint Exposed: No Bone Exposed: No Treatment Notes Wound #1 (Foot) Wound  Laterality: Right, Lateral Cleanser [place] Ancillary Kit - 15 Day Supply Discharge Instruction: Use supplies as instructed; Kit contains: (15) Saline Bullets; (15) 3x3 Gauze; 15 pr Gloves Normal Saline Delfin, Jonathan D. ([id number]) Discharge Instruction: Wash your hands with soap and water. Remove old dressing, discard into plastic bag and place into trash. Cleanse the wound with Normal Saline prior to applying a clean dressing using gauze sponges, not tissues or cotton balls. Do not scrub or use excessive force. Pat dry using gauze sponges, not tissue or cotton balls. Peri-Wound Care Topical Primary Dressing Hydrofera Blue Ready Transfer Foam, 2.5x2.5 (in/in) Discharge Instruction: Apply Hydrofera Blue Ready to wound bed as directed Secondary Dressing ABD Pad 5x9 (in/in) Discharge Instruction: Cover with ABD pad Secured With Compression Wrap Profore Lite LF 3 Multilayer Compression [place] Discharge Instruction: Apply 3 multi-layer wrap as prescribed. Compression Stockings Add-Ons Electronic Signature(s) Signed: 07/15/2020 4:31:30 PM By: Jeanine Luz Signed: 07/15/2020 5:08:01 PM By: Carlene Coria RN Entered By: Jeanine Luz on 07/15/2020 11:25:13 Vessels, [name] ([id number]) -------------------------------------------------------------------------------- Vitals Details [name] Date of Service: 07/15/2020 11:00 AM Medical Record Number: [id number] Patient Account Number: [id number] Date of Birth/Sex: 1934-07-31 (85 y.o. M) Treating RN: Carlene Coria Primary Care Trice Aspinall: Nobie Putnam Other Clinician: Jeanine Luz Referring Kolten Ryback: Nobie Putnam Treating Sabian Kuba/Extender: Skipper Cliche in Treatment: 6 Vital Signs Time Taken:  11:09 Temperature (F): 97.7 Height (in): 71 Pulse (bpm): 75 Weight (lbs): 216 Respiratory Rate (breaths/min): 18 Body Mass Index (BMI): 30.1 Blood Pressure (mmHg): 143/73 Reference Range: 80 - 120 mg / dl Electronic Signature(s) Signed: 07/15/2020 4:31:30 PM By: Jeanine Luz Entered By: Jeanine Luz on 07/15/2020 11:10:21

## 2020-07-16 ENCOUNTER — Encounter: Payer: Self-pay | Admitting: Urology

## 2020-07-16 ENCOUNTER — Ambulatory Visit: Payer: Self-pay | Admitting: Urology

## 2020-07-19 ENCOUNTER — Ambulatory Visit (INDEPENDENT_AMBULATORY_CARE_PROVIDER_SITE_OTHER): Payer: Medicare Other | Admitting: Pharmacist

## 2020-07-19 DIAGNOSIS — R300 Dysuria: Secondary | ICD-10-CM | POA: Diagnosis not present

## 2020-07-19 DIAGNOSIS — E785 Hyperlipidemia, unspecified: Secondary | ICD-10-CM | POA: Diagnosis not present

## 2020-07-19 DIAGNOSIS — I10 Essential (primary) hypertension: Secondary | ICD-10-CM

## 2020-07-19 DIAGNOSIS — R413 Other amnesia: Secondary | ICD-10-CM

## 2020-07-19 DIAGNOSIS — N183 Chronic kidney disease, stage 3 unspecified: Secondary | ICD-10-CM | POA: Diagnosis not present

## 2020-07-19 DIAGNOSIS — I69315 Cognitive social or emotional deficit following cerebral infarction: Secondary | ICD-10-CM | POA: Diagnosis not present

## 2020-07-19 DIAGNOSIS — L89612 Pressure ulcer of right heel, stage 2: Secondary | ICD-10-CM | POA: Diagnosis not present

## 2020-07-19 DIAGNOSIS — I129 Hypertensive chronic kidney disease with stage 1 through stage 4 chronic kidney disease, or unspecified chronic kidney disease: Secondary | ICD-10-CM | POA: Diagnosis not present

## 2020-07-19 NOTE — Chronic Care Management (AMB) (Signed)
Chronic Care Management Pharmacy Note  07/19/2020 Name:  [name] MRN:  [id number] DOB:  15-Mar-1935  Subjective: [name] is an 86 y.o. year old male who is a primary patient of [name], DO.  The CCM team was consulted for assistance with disease management and care coordination needs.    Engaged with patient and caregiver by telephone for follow up visit in response to provider referral for pharmacy case management and/or care coordination services.    Consent to Services:  The patient was given information about Chronic Care Management services, agreed to services, and gave verbal consent prior to initiation of services.  Please see initial visit note for detailed documentation.   Patient Care Team: [name], DO as PCP - General (Family Medicine) Greg Cutter, [name] as [place] Management (Licensed Clinical Social Worker) Hall Busing, Nobie Putnam, RN as Case Manager ([place]) Winfield Cunas Virl Diamond, [place] as Pharmacist   Recent consult visits: Office Visit with Wound Care on 3/28  Hospital visits: None in previous 6 months  Objective:  Lab Results  Component Value Date   CREATININE 1.25 (H) 06/13/2020   CREATININE 0.98 08/07/2018   CREATININE 1.14 09/18/2017       Component Value Date/Time   CHOL 163 06/13/2020 1113   TRIG 100 06/13/2020 1113   HDL 37 (L) 06/13/2020 1113   CHOLHDL 4.4 06/13/2020 1113   VLDL 21 07/20/2016 0001   LDLCALC 106 (H) 06/13/2020 1113    Social History   Tobacco Use  Smoking Status Former Smoker  . Quit date: 46  . Years since quitting: 37.2  Smokeless Tobacco Former User   BP Readings from Last 3 Encounters:  06/26/20 (!) 160/74  06/25/20 140/84  06/13/20 (!) 151/78   Pulse Readings from Last 3 Encounters:  06/26/20 73  06/25/20 73  06/13/20 66   Per review of chart, note BP 143/73,  Pulse 75 at Office Visit with Wound Care on 3/28  Wt Readings from Last 3  Encounters:  06/26/20 224 lb (101.6 kg)  06/25/20 222 lb 12.8 oz (101.1 kg)  06/13/20 226 lb 6.4 oz (102.7 kg)    Assessment: Review of patient past medical history, allergies, medications, health status, including review of consultants reports, laboratory and other test data, was performed as part of comprehensive evaluation and provision of chronic care management services.   SDOH:  (Social Determinants of Health) assessments and interventions performed: none   CCM Care Plan  No Known Allergies  Medications Reviewed Today    Reviewed by Vella Raring, [place] (Pharmacist) on 07/05/20 at 1242  Med List Status: <None>  Medication Order Taking? Sig Documenting Provider Last Dose Status Informant  aspirin EC 81 MG tablet [id number]  Take 1 tablet (81 mg total) by mouth in the morning. (blood thinner) Karamalegos, Devonne Doughty, DO  Active   atorvastatin (LIPITOR) 40 MG tablet [id number] Yes Take 1 tablet (40 mg total) by mouth at bedtime. (Cholesterol) [name], DO Taking Active   clopidogrel (PLAVIX) 75 MG tablet [id number]  Take 1 tablet (75 mg total) by mouth in the morning. (blood thinner) Karamalegos, Devonne Doughty, DO  Active   levofloxacin (LEVAQUIN) 500 MG tablet [id number]  Take 500 mg by mouth daily. [provider]  Active   lisinopril (ZESTRIL) 20 MG tablet [id number]  Take 1 tablet (20 mg total) by mouth in the morning. (Blood pressure) Karamalegos, Devonne Doughty, DO  Active   terazosin (  HYTRIN) 5 MG capsule [id number]  Take 1 capsule (5 mg total) by mouth at bedtime. (blood pressure, prostate) [name], DO  Active           Patient Active Problem List   Diagnosis Date Noted  . Cognitive deficit as late effect of cerebrovascular accident (CVA) 06/25/2020  . Essential hypertension 06/25/2020  . Imbalance 02/01/2018  . Humeral head fracture, left, closed, initial encounter 09/22/2017  . History of cerebrovascular accident (CVA) with residual  deficit 09/09/2017  . CKD (chronic kidney disease), stage III ([place]) 09/01/2017  . Elevated PSA, between 10 and less than 20 ng/ml 07/22/2016  . Bilateral lower extremity edema 07/22/2016  . Abnormal glucose 07/15/2016  . BPH with obstruction/lower urinary tract symptoms 04/10/2016  . Benign hypertension with CKD (chronic kidney disease) stage III ([place]) 04/10/2016  . Memory loss 04/10/2016  . Gastric outlet obstruction 12/02/2014  . Generalized abdominal pain   . Benign fibroma of prostate 01/02/2011  . HLD (hyperlipidemia) 01/02/2011  . Obesity (BMI 30.0-34.9) 01/02/2011    Immunization History  Administered Date(s) Administered  . Fluad Quad(high Dose 65+) 12/14/2018  . Influenza, Seasonal, Injecte, Preservative Fre 02/07/2009  . Influenza-Unspecified 02/02/2002, 02/04/2004, 01/19/2007, 01/06/2008, 01/18/2010, 12/20/2010, 12/20/2014  . Pneumococcal Polysaccharide-23 02/27/2002, 08/31/2017  . Td 02/27/2002  . Tdap 09/08/2017    Conditions to be addressed/monitored: HTN and HLD  Care Plan : PharmD - Med Mgmt  Updates made by Vella Raring, RPH-CPP since 07/19/2020 12:00 AM    Problem: Disease Progression     Long-Range Goal: Disease Progression Prevented or Minimized   Start Date: 07/03/2020  Expected End Date: 10/01/2020  This Visit's Progress: On track  Recent Progress: On track  Priority: High  Note:   Current Barriers:  . Unable to self-administer medications as prescribed . Chronic Disease Management support and education needs related to memory loss and changes . Lack of blood pressure readings for clinical team  Pharmacist Clinical Goal(s):   Over the next 90 days, patient will achieve adherence to monitoring guidelines and medication adherence to achieve therapeutic efficacy through collaboration with PharmD and provider.   Interventions: . 1:1 collaboration with [name], DO regarding development and update of comprehensive plan of care as  evidenced by provider attestation and co-signature . Inter-disciplinary care team collaboration (see longitudinal plan of care) . Perform chart review o Patient seen for Office Visit with Wound Care on 3/28 o Patient missed appointment with Urology on 3/29 . Today speak with both patient as well as patient's friend/caregiver, [name]. [place] RN helping patient with wound care/dressings at home . Encourage follow up with Urology to reschedule missed appointment. Caregiver confirms having phone number for office . Caregiver reports plans to attend next Wound Care appointment with patient on 4/4  Medication Adherence: . Caregiver and patient confirm patient now taking medications from pill pack as filled by [place] RN also helping patient with managing medications o Reports patient has completed course of levofloxacin from Wound Care . Again encourage caregiver to label pill pack with days of the week to aid with adherence  Hypertension: . Current treatment: o Lisinopril 20 mg QAM o Terazosin 5 mg QHS . Per review of chart, note BP 143/73,  Pulse 75 at Office Visit with Wound Care on 3/28 . Patient has a home wrist monitor . Have encouraged patient to monitor home blood pressure, keep log of results and have record to review  during next telephone outreach  Patient Goals/Self-Care Activities  Over the next 90 days, patient will:  - focus on medication adherence by using weekly pillbox with assistance from/as filled by friend, [name]  Follow Up Plan: Telephone follow up appointment with care management team member scheduled for: 08/19/2020 at 10:30 AM      Medication Assistance: None required.  Patient affirms current coverage meets needs.  Patient's preferred pharmacy is:  [facility] - [address], [place] [address] Phone: [phone] Fax:  [phone]  [facility] [address] [place] - [place] [address] [place] [address] Phone: [phone] Fax: [phone]  [facility] [place], [place] [address] [place] [address] Phone: [phone] Fax: [phone]  [place], [place], [place], [place] [address] Phone: [phone] Fax: [place], [place] [address] Phone: [phone] Fax: [place], [place]. [place] [address] Phone: [phone] Fax: [phone]  Uses pill box? Using pill pack  Follow Up:  Patient agrees to Care Plan and Follow-up.  Plan: Telephone follow up appointment with care management team member scheduled for:  08/19/2020 at 10:30 AM  Harlow Asa, PharmD, [place], [place] [phone]

## 2020-07-19 NOTE — Patient Instructions (Signed)
Visit Information  PATIENT GOALS: Goals Addressed            This Visit's Progress   . Pharmacy Goals       Our goal bad cholesterol, or LDL, is less than 70 . This is why it is important to continue taking your atorvastatin.  Please check your home blood pressure, keep a log of the results and bring this with you to your medical appointments.  Feel free to call me with any questions or concerns. I look forward to our next call!   Harlow Asa, PharmD, [place] [phone]       The patient verbalized understanding of instructions, educational materials, and care plan provided today and declined offer to receive copy of patient instructions, educational materials, and care plan.   Telephone follow up appointment with care management team member scheduled for: 08/19/2020 at 10:30 AM

## 2020-07-22 ENCOUNTER — Other Ambulatory Visit: Payer: Self-pay

## 2020-07-22 ENCOUNTER — Encounter: Payer: Self-pay | Admitting: Urology

## 2020-07-22 ENCOUNTER — Encounter: Payer: Medicare Other | Attending: Physician Assistant | Admitting: Physician Assistant

## 2020-07-22 ENCOUNTER — Telehealth: Payer: Self-pay | Admitting: Urology

## 2020-07-22 DIAGNOSIS — L89610 Pressure ulcer of right heel, unstageable: Secondary | ICD-10-CM | POA: Insufficient documentation

## 2020-07-22 DIAGNOSIS — I10 Essential (primary) hypertension: Secondary | ICD-10-CM | POA: Insufficient documentation

## 2020-07-22 DIAGNOSIS — L89890 Pressure ulcer of other site, unstageable: Secondary | ICD-10-CM | POA: Diagnosis not present

## 2020-07-22 NOTE — Telephone Encounter (Signed)
Would you call [name] and let him know he missed his prostate MRI appointment and we need to get that rescheduled?

## 2020-07-22 NOTE — Progress Notes (Addendum)
MAMOUDOU, MULVEHILL ([id number]) Visit Report for 07/22/2020 Chief Complaint Document Details [name], [name] Date of Service: 07/22/2020 12:45 PM Medical Record Number: [id number] Patient Account Number: [id number] Date of Birth/Sex: 27-May-1934 (85 y.o. M) Treating RN: Carlene Coria Primary Care Provider: Nobie Putnam Other Clinician: Jeanine Luz Referring Provider: Nobie Putnam Treating Provider/Extender: Skipper Cliche in Treatment: 7 Information Obtained from: Patient Chief Complaint Right heel ulcer Electronic Signature(s) Signed: 07/22/2020 1:25:52 PM By: Worthy Keeler PA-C Entered By: Worthy Keeler on 07/22/2020 13:25:52 [name], [name] ([id number]) -------------------------------------------------------------------------------- Debridement Details [name] Date of Service: 07/22/2020 12:45 PM Medical Record Number: [id number] Patient Account Number: [id number] Date of Birth/Sex: [birth year] (85 y.o. M) Treating RN: Carlene Coria Primary Care Provider: Nobie Putnam Other Clinician: Jeanine Luz Referring Provider: Nobie Putnam Treating Provider/Extender: Skipper Cliche in Treatment: 7 Debridement Performed for Wound #1 Right,Lateral Foot Assessment: Performed By: Physician Tommie Sams., PA-C Debridement Type: Debridement Level of Consciousness (Pre- Awake and Alert procedure): Pre-procedure Verification/Time Out Yes - 13:35 Taken: Start Time: 13:35 Pain Control: Lidocaine 4% Topical Solution Total Area Debrided (L x W): 5.4 (cm) x 3.3 (cm) = 17.82 (cm) Tissue and other material Viable, Non-Viable, Slough, Subcutaneous, Skin: Dermis , Skin: Epidermis, Slough debrided: Level: Skin/Subcutaneous Tissue Debridement Description: Excisional Instrument: Curette Bleeding: Minimum Hemostasis Achieved: Pressure End Time: 13:41 Procedural Pain: 0 Post Procedural Pain: 0 Response to Treatment:  Procedure was tolerated well Level of Consciousness (Post- Awake and Alert procedure): Post Debridement Measurements of Total Wound Length: (cm) 5.4 Stage: Unstageable/Unclassified Width: (cm) 3.3 Depth: (cm) 0.2 Volume: (cm) 2.799 Character of Wound/Ulcer Post Debridement: Improved Post Procedure Diagnosis Same as Pre-procedure Electronic Signature(s) Signed: 07/22/2020 5:03:26 PM By: Worthy Keeler PA-C Signed: 07/26/2020 8:10:37 AM By: Carlene Coria RN Entered By: Carlene Coria on 07/22/2020 13:40:08 [name], [name] Kitchen ([id number]) -------------------------------------------------------------------------------- HPI Details Patient Name: Jonathan Moros D. Date of Service: 07/22/2020 12:45 PM Medical Record Number: [id number] Patient Account Number: [id number] Date of Birth/Sex: [birth year] (85 y.o. M) Treating RN: Carlene Coria Primary Care Provider: Nobie Putnam Other Clinician: Jeanine Luz Referring Provider: Nobie Putnam Treating Provider/Extender: Skipper Cliche in Treatment: 7 History of Present Illness HPI Description: 06/03/2020 upon evaluation today patient actually presents for initial inspection here in our clinic after having gone to urgent care this morning where they were concerned about infection as well as a significant "softball sized wound with eschar" over the heel. With that being said the patient fortunately does not have an area that largely does have an area of eschar which is draining around the edges really not stable and I think we are getting to remove the eschar so we try to clean up the surface of the wound. Fortunately there is no evidence of active infection at this time. I do believe that he is going require some debridement both manually today with scissors and forceps as well as enzymatic/chemically with the medications. I really feel like that he has a good chance to get this to heal if he keep pressure off of it we did discuss that  again today. He does note this has been present for about 2 months. He is not taking any of his current medications that we did put him on the list he tells me that he only occasionally takes them which is obviously not doing anything for him. He does have a cane but he does not use it and his ABI was 1.14 on the right.  06/10/2020 upon evaluation today patient appears to be doing well with regard to his wound all things considered. He still has a lot of necrotic tissue in the central portion of the wound. I think the Iodosorb is still the right thing to do and again the biggest issue is he quit using it as he and his wife felt like that the drainage following putting on the medicine was really "nasty". Nonetheless I explained that the weight Iodoflex breaks down or Iodosorb and that round does make it look really bad but it actually is not as bad as what it seems. In fact it is the normal course of things. 06/24/2020 upon evaluation today patient appears to be doing unfortunately about the same in regard to his heel. I do not think this is any worse. However its become increasingly clear that he has significant dementia in my opinion. We have had multiple instances going on here from the standpoint of trying to get him to take the oral antibiotic. Initially we ordered it for him he picked it up on 1 afternoon and by the next morning we called to make sure that he had gotten it he told us yes but that he had thrown it away. That he was not to take any additional medications. We then told him he needed to be taken this to get the infection under control he subsequently look for it only found the bottle never took the medication therefore I sent it in a second time for him. After sending this and it looks like he has not really taken it since that time he tells me that he took 7 pills at once although I do not think they were all of the same medication and then following this it made him sick I think  these were all of his different medicines. He decided he was not to take any further medication after that point. Nonetheless I do believe he still is going require treatment for this infection the good news is he has a friend who is a very good friend that is with him here today to try to help with figuring out what to do and how going forward this should be taken care of. 07/01/2020 upon evaluation today patient appears to be doing really about the same. His measurements are maybe slightly smaller than previous. Fortunately there is no signs of active infection which is great news. No fevers, chills, nausea, vomiting, or diarrhea. 07/08/2020 upon evaluation today patient appears to be doing better in regard to his wounds. He looks like he finally actually took the Levaquin which has helped with the infection. I think things are doing much better in that regard which is great news. No fevers, chills, nausea, vomiting, or diarrhea. Unfortunately he did have actual Kleenex/tissue paper over the wound on really not sure why his wife put that on there but nonetheless I think were ready to start wrapping this week. 07/15/2020 on evaluation today patient appears to be doing about the same in regard to his wound. Unfortunately there does not appear to be any significant improvement. With that being said he also came in today with the heel of his wrap completely hanging out where the wound was exposed and just rubbing on the bedroom slipper that he has on today. With that being said this is obviously not ideal. I discussed that with the patient I really think that he is getting need to be more careful and conscientious about what is going on  with his wrap. With that being said some of this is to know followed on his own that he actually has in my opinion significant dementia which inhibits his ability to be able to comply. He has a friend with him today who is trying to help out as best he can I am just not  certain that Mr. Mendizabal is actually able to do what needs to be done here. 07/22/20 upon evaluation today patient appears to be doing well with regard to his wound is measuring a little bit smaller. With that being said he did not have any dressings on whatsoever upon evaluation today it was just gauze wrapped Curlex over his leg. Subsequently the Coban following this really was not the appropriate wrap and more importantly he had no dressing actually on the wound itself. Fortunately there does not appear to be any signs of active infection which is great news. Unfortunately my main concern here is that the patient needs to have appropriate dressings on in order to prevent infection and allow for continued and appropriate healing. Electronic Signature(s) Signed: 07/22/2020 1:48:11 PM By: Worthy Keeler PA-C Entered By: Worthy Keeler on 07/22/2020 13:48:10 [name], [name] ([id number]) -------------------------------------------------------------------------------- Physical Exam Details [name] Date of Service: 07/22/2020 12:45 PM Medical Record Number: [id number] Patient Account Number: [id number] Date of Birth/Sex: 1935-03-17 (85 y.o. M) Treating RN: Carlene Coria Primary Care Provider: Nobie Putnam Other Clinician: Jeanine Luz Referring Provider: Nobie Putnam Treating Provider/Extender: Skipper Cliche in Treatment: 7 Constitutional Well-nourished and well-hydrated in no acute distress. Respiratory normal breathing without difficulty. Psychiatric this patient is able to make decisions and demonstrates good insight into disease process. Alert and Oriented x 3. pleasant and cooperative. Notes Upon inspection patient's wound bed actually showed signs of good granulation and epithelization there was some callus around the edges of the wound as well as slough from the surface of the wound that did require sharp debridement today I perform debridement to  clear all this away patient tolerated that without complication bleeding was noted but controlled with pressure. Electronic Signature(s) Signed: 07/22/2020 1:48:29 PM By: Worthy Keeler PA-C Entered By: Worthy Keeler on 07/22/2020 13:48:29 [name], [name] ([id number]) -------------------------------------------------------------------------------- Physician Orders Details [name] Date of Service: 07/22/2020 12:45 PM Medical Record Number: [id number] Patient Account Number: [id number] Date of Birth/Sex: Nov 03, 1934 (85 y.o. M) Treating RN: Carlene Coria Primary Care Provider: Nobie Putnam Other Clinician: Jeanine Luz Referring Provider: Nobie Putnam Treating Provider/Extender: Skipper Cliche in Treatment: 7 Verbal / Phone Orders: No Diagnosis Coding ICD-10 Coding Code Description L89.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, unstageable R29.6 Repeated falls Z79.01 Long term (current) use of anticoagulants Follow-up Appointments o Return Appointment in 1 week. [place] for wound care. May utilize formulary equivalent dressing for wound treatment orders unless otherwise specified. Home Health Nurse may visit [facility] to address patientos wound care needs. - [facility] to change on Wednesdays and Fridays , patient to be seen in clinic on mondays Edema Control - Lymphedema / Segmental Compressive Device / Other o Elevate, Exercise Daily and Avoid Standing for Long Periods of Time. o Elevate legs to the level of the heart and pump ankles as often as possible o Elevate leg(s) parallel to the floor when sitting. Off-Loading o Open toe surgical shoe - right foot Wound Treatment Wound #1 - Foot Wound Laterality: Right, Lateral Cleanser: Byram Ancillary Kit - 15 Day Supply (Generic) 3 x Per  Week/30 Days Discharge Instructions: Use supplies as instructed; Kit contains: (15) Saline Bullets; (15) 3x3 Gauze; 15 pr  Gloves Cleanser: Normal Saline (Generic) 3 x Per Week/30 Days Discharge Instructions: Wash your hands with soap and water. Remove old dressing, discard into plastic bag and place into trash. Cleanse the wound with Normal Saline prior to applying a clean dressing using gauze sponges, not tissues or cotton balls. Do not scrub or use excessive force. Pat dry using gauze sponges, not tissue or cotton balls. Primary Dressing: Hydrofera Blue Ready Transfer Foam, 2.5x2.5 (in/in) (Generic) 3 x Per Week/30 Days Discharge Instructions: Apply Hydrofera Blue Ready to wound bed as directed Secondary Dressing: ABD Pad 5x9 (in/in) (Generic) 3 x Per Week/30 Days Discharge Instructions: Cover with ABD pad Compression Wrap: Profore Lite LF 3 Multilayer Compression Bandaging System (Generic) 3 x Per Week/30 Days Discharge Instructions: Apply 3 multi-layer wrap as prescribed. Electronic Signature(s) Signed: 07/22/2020 5:03:26 PM By: Worthy Keeler PA-C Signed: 07/26/2020 8:10:37 AM By: Carlene Coria RN Entered By: Carlene Coria on 07/22/2020 13:40:55 [name], [name] ([id number]) -------------------------------------------------------------------------------- Problem List Details [name] Date of Service: 07/22/2020 12:45 PM Medical Record Number: [id number] Patient Account Number: [id number] Date of Birth/Sex: December 31, 1934 (85 y.o. M) Treating RN: Carlene Coria Primary Care Provider: Nobie Putnam Other Clinician: Jeanine Luz Referring Provider: Nobie Putnam Treating Provider/Extender: Skipper Cliche in Treatment: 7 Active Problems ICD-10 Encounter Code Description Active Date MDM Diagnosis L89.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, unstageable 06/03/2020 No Yes R29.6 Repeated falls 06/03/2020 No Yes Z79.01 Long term (current) use of anticoagulants 06/03/2020 No Yes Inactive Problems Resolved Problems Electronic Signature(s) Signed: 07/22/2020 1:25:22 PM By: Worthy Keeler  PA-C Entered By: Worthy Keeler on 07/22/2020 13:25:22 [name], [name] ([id number]) -------------------------------------------------------------------------------- Progress Note Details [name] Date of Service: 07/22/2020 12:45 PM Medical Record Number: [id number] Patient Account Number: [id number] Date of Birth/Sex: 1934/07/26 (85 y.o. M) Treating RN: Carlene Coria Primary Care Provider: Nobie Putnam Other Clinician: Jeanine Luz Referring Provider: Nobie Putnam Treating Provider/Extender: Skipper Cliche in Treatment: 7 Subjective Chief Complaint Information obtained from Patient Right heel ulcer History of Present Illness (HPI) 06/03/2020 upon evaluation today patient actually presents for initial inspection here in our clinic after having gone to urgent care this morning where they were concerned about infection as well as a significant "softball sized wound with eschar" over the heel. With that being said the patient fortunately does not have an area that largely does have an area of eschar which is draining around the edges really not stable and I think we are getting to remove the eschar so we try to clean up the surface of the wound. Fortunately there is no evidence of active infection at this time. I do believe that he is going require some debridement both manually today with scissors and forceps as well as enzymatic/chemically with the medications. I really feel like that he has a good chance to get this to heal if he keep pressure off of it we did discuss that again today. He does note this has been present for about 2 months. He is not taking any of his current medications that we did put him on the list he tells me that he only occasionally takes them which is obviously not doing anything for him. He does have a cane but he does not use it and his ABI was 1.14 on the right. 06/10/2020 upon evaluation today patient appears to be doing  well with regard to his wound all things considered. He still has a lot of necrotic tissue in the central portion of the wound. I think the Iodosorb is still the right thing to do and again the biggest issue is he quit using it as he and his wife felt like that the drainage following putting on the medicine was really "nasty". Nonetheless I explained that the weight Iodoflex breaks down or Iodosorb and that round does make it look really bad but it actually is not as bad as what it seems. In fact it is the normal course of things. 06/24/2020 upon evaluation today patient appears to be doing unfortunately about the same in regard to his heel. I do not think this is any worse. However its become increasingly clear that he has significant dementia in my opinion. We have had multiple instances going on here from the standpoint of trying to get him to take the oral antibiotic. Initially we ordered it for him he picked it up on 1 afternoon and by the next morning we called to make sure that he had gotten it he told us yes but that he had thrown it away. That he was not to take any additional medications. We then told him he needed to be taken this to get the infection under control he subsequently look for it only found the bottle never took the medication therefore I sent it in a second time for him. After sending this and it looks like he has not really taken it since that time he tells me that he took 7 pills at once although I do not think they were all of the same medication and then following this it made him sick I think these were all of his different medicines. He decided he was not to take any further medication after that point. Nonetheless I do believe he still is going require treatment for this infection the good news is he has a friend who is a very good friend that is with him here today to try to help with figuring out what to do and how going forward this should be taken care of. 07/01/2020  upon evaluation today patient appears to be doing really about the same. His measurements are maybe slightly smaller than previous. Fortunately there is no signs of active infection which is great news. No fevers, chills, nausea, vomiting, or diarrhea. 07/08/2020 upon evaluation today patient appears to be doing better in regard to his wounds. He looks like he finally actually took the Levaquin which has helped with the infection. I think things are doing much better in that regard which is great news. No fevers, chills, nausea, vomiting, or diarrhea. Unfortunately he did have actual Kleenex/tissue paper over the wound on really not sure why his wife put that on there but nonetheless I think were ready to start wrapping this week. 07/15/2020 on evaluation today patient appears to be doing about the same in regard to his wound. Unfortunately there does not appear to be any significant improvement. With that being said he also came in today with the heel of his wrap completely hanging out where the wound was exposed and just rubbing on the bedroom slipper that he has on today. With that being said this is obviously not ideal. I discussed that with the patient I really think that he is getting need to be more careful and conscientious about what is going on with his wrap. With that being said some of  this is to know followed on his own that he actually has in my opinion significant dementia which inhibits his ability to be able to comply. He has a friend with him today who is trying to help out as best he can I am just not certain that Mr. Ryle is actually able to do what needs to be done here. 07/22/20 upon evaluation today patient appears to be doing well with regard to his wound is measuring a little bit smaller. With that being said he did not have any dressings on whatsoever upon evaluation today it was just gauze wrapped Curlex over his leg. Subsequently the Coban following this really was not the  appropriate wrap and more importantly he had no dressing actually on the wound itself. Fortunately there does not appear to be any signs of active infection which is great news. Unfortunately my main concern here is that the patient needs to have appropriate dressings on in order to prevent infection and allow for continued and appropriate healing. Objective [name], Jonathan D. ([id number]) Constitutional Well-nourished and well-hydrated in no acute distress. Vitals Time Taken: 1:11 PM, Height: 71 in, Weight: 216 lbs, BMI: 30.1, Temperature: 97.8 F, Pulse: 69 bpm, Respiratory Rate: 18 breaths/min, Blood Pressure: 172/83 mmHg. Respiratory normal breathing without difficulty. Psychiatric this patient is able to make decisions and demonstrates good insight into disease process. Alert and Oriented x 3. pleasant and cooperative. General Notes: Upon inspection patient's wound bed actually showed signs of good granulation and epithelization there was some callus around the edges of the wound as well as slough from the surface of the wound that did require sharp debridement today I perform debridement to clear all this away patient tolerated that without complication bleeding was noted but controlled with pressure. Integumentary (Hair, Skin) Wound #1 status is Open. Original cause of wound was Pressure Injury. The date acquired was: 04/03/2020. The wound has been in treatment 7 weeks. The wound is located on the Right,Lateral Foot. The wound measures 5.4cm length x 3.3cm width x 0.2cm depth; 13.996cm^2 area and 2.799cm^3 volume. There is Fat Layer (Subcutaneous Tissue) exposed. There is no tunneling noted, however, there is undermining starting at 3:00 and ending at 6:00 with a maximum distance of 0.3cm. There is a medium amount of serosanguineous drainage noted. The wound margin is thickened. There is large (67-100%) red granulation within the wound bed. There is a small (1-33%) amount of necrotic tissue  within the wound bed including Adherent Slough. Assessment Active Problems ICD-10 Pressure ulcer of right heel, unstageable Repeated falls Long term (current) use of anticoagulants Procedures Wound #1 Pre-procedure diagnosis of Wound #1 is a Pressure Ulcer located on the Right,Lateral Foot . There was a Excisional Skin/Subcutaneous Tissue Debridement with a total area of 17.82 sq cm performed by Tommie Sams., PA-C. With the following instrument(s): Curette to remove Viable and Non-Viable tissue/material. Material removed includes Subcutaneous Tissue, Slough, Skin: Dermis, and Skin: Epidermis after achieving pain control using Lidocaine 4% Topical Solution. No specimens were taken. A time out was conducted at 13:35, prior to the start of the procedure. A Minimum amount of bleeding was controlled with Pressure. The procedure was tolerated well with a pain level of 0 throughout and a pain level of 0 following the procedure. Post Debridement Measurements: 5.4cm length x 3.3cm width x 0.2cm depth; 2.799cm^3 volume. Post debridement Stage noted as Unstageable/Unclassified. Character of Wound/Ulcer Post Debridement is improved. Post procedure Diagnosis Wound #1: Same as Pre-Procedure Pre-procedure diagnosis of Wound #1  is a Pressure Ulcer located on the Right,Lateral Foot . There was a Three Layer Compression Therapy Procedure by Carlene Coria, RN. Post procedure Diagnosis Wound #1: Same as Pre-Procedure Plan Follow-up Appointments: Return Appointment in 1 week. Home Health: [facility] for wound care. May utilize formulary equivalent dressing for wound treatment orders unless otherwise specified. Home Health Nurse may visit [facility] to address patient s wound care needs. - [facility] to change on Wednesdays and Fridays , patient to be seen in clinic on mondays [name], Jonathan D. ([id number]) Edema Control - Lymphedema / Segmental Compressive Device / Other: Elevate, Exercise Daily and Avoid  Standing for Long Periods of Time. Elevate legs to the level of the heart and pump ankles as often as possible Elevate leg(s) parallel to the floor when sitting. Off-Loading: Open toe surgical shoe - right foot WOUND #1: - Foot Wound Laterality: Right, Lateral Cleanser: Byram Ancillary Kit - 15 Day Supply (Generic) 3 x Per Week/30 Days Discharge Instructions: Use supplies as instructed; Kit contains: (15) Saline Bullets; (15) 3x3 Gauze; 15 pr Gloves Cleanser: Normal Saline (Generic) 3 x Per Week/30 Days Discharge Instructions: Wash your hands with soap and water. Remove old dressing, discard into plastic bag and place into trash. Cleanse the wound with Normal Saline prior to applying a clean dressing using gauze sponges, not tissues or cotton balls. Do not scrub or use excessive force. Pat dry using gauze sponges, not tissue or cotton balls. Primary Dressing: Hydrofera Blue Ready Transfer Foam, 2.5x2.5 (in/in) (Generic) 3 x Per Week/30 Days Discharge Instructions: Apply Hydrofera Blue Ready to wound bed as directed Secondary Dressing: ABD Pad 5x9 (in/in) (Generic) 3 x Per Week/30 Days Discharge Instructions: Cover with ABD pad Compression Wrap: Profore Lite LF 3 Multilayer Compression Bandaging System (Generic) 3 x Per Week/30 Days Discharge Instructions: Apply 3 multi-layer wrap as prescribed. 1. Would recommend currently that we continue with wound care measures as before the patient is in agreement the plan this includes the use of the [facility] dressing followed by an ABD pad and then a 3 layer compression wrap. 2. The patient should continue to offload and elevate his heel is much as possible. We will see patient back for reevaluation in 1 week here in the clinic. If anything worsens or changes patient will contact our office for additional recommendations. Electronic Signature(s) Signed: 07/22/2020 1:48:57 PM By: Worthy Keeler PA-C Entered By: Worthy Keeler on 07/22/2020  13:48:56 Garry, [name] ([id number]) -------------------------------------------------------------------------------- SuperBill Details [name] Date of Service: 07/22/2020 Medical Record Number: [id number] Patient Account Number: [id number] Date of Birth/Sex: 1934/04/22 (86 y.o. M) Treating RN: Carlene Coria Primary Care Provider: Nobie Putnam Other Clinician: Jeanine Luz Referring Provider: Nobie Putnam Treating Provider/Extender: Skipper Cliche in Treatment: 7 Diagnosis Coding ICD-10 Codes Code Description L89.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, unstageable R29.6 Repeated falls Z79.01 Long term (current) use of anticoagulants Facility Procedures CPT4 Code: [id number] Description: 57473 - DEB SUBQ TISSUE 20 SQ CM/< Modifier: Quantity: 1 CPT4 Code: Description: ICD-10 Diagnosis Description L89.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, unstageable Modifier: Quantity: Physician Procedures CPT4 Code: 4037096 Description: 43838 - WC PHYS SUBQ TISS 20 SQ CM Modifier: Quantity: 1 CPT4 Code: Description: ICD-10 Diagnosis Description L89.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, unstageable Modifier: Quantity: Electronic Signature(s) Signed: 07/22/2020 1:49:07 PM By: Worthy Keeler PA-C Entered By: Worthy Keeler on 07/22/2020 13:49:06

## 2020-07-22 NOTE — Telephone Encounter (Signed)
Spoke with pt and gave number to scheduling.

## 2020-07-23 ENCOUNTER — Ambulatory Visit: Payer: Self-pay | Admitting: Licensed Clinical Social Worker

## 2020-07-23 ENCOUNTER — Telehealth: Payer: Self-pay | Admitting: Family Medicine

## 2020-07-23 DIAGNOSIS — I129 Hypertensive chronic kidney disease with stage 1 through stage 4 chronic kidney disease, or unspecified chronic kidney disease: Secondary | ICD-10-CM

## 2020-07-23 DIAGNOSIS — N183 Chronic kidney disease, stage 3 unspecified: Secondary | ICD-10-CM | POA: Diagnosis not present

## 2020-07-23 DIAGNOSIS — R413 Other amnesia: Secondary | ICD-10-CM

## 2020-07-23 DIAGNOSIS — I10 Essential (primary) hypertension: Secondary | ICD-10-CM

## 2020-07-23 DIAGNOSIS — E782 Mixed hyperlipidemia: Secondary | ICD-10-CM

## 2020-07-23 DIAGNOSIS — R1084 Generalized abdominal pain: Secondary | ICD-10-CM

## 2020-07-23 DIAGNOSIS — I69319 Unspecified symptoms and signs involving cognitive functions following cerebral infarction: Secondary | ICD-10-CM

## 2020-07-23 NOTE — Telephone Encounter (Signed)
Patient called and states he received a letter about a missed appointment. I informed him that he was suppose to have a prostate MRI and then come to our office for the results. He no showed for both appointments. The MRI has been cancelled. Can you reorder the MRI?

## 2020-07-23 NOTE — Patient Instructions (Signed)
Licensed Clinical Social Worker Visit Information  Goals we discussed today:  Goals Addressed              This Visit's Progress   .  "I need help with caregiving in the home" (pt-stated)        Timeframe:  Long-Range Goal Priority:  Medium Start Date:   07/09/20                   Expected End Date: 10/09/20                   Follow Up Date- 09/04/20  Current Barrers:  . Limited social support . ADL IADL limitations . Social Isolation . Limited access to caregiver . Inability to perform ADL's independently . Inability to perform IADL's independently . Lacks knowledge of community resource: personal care services  Clinical Social Work Clinical Goal(s):  Marland Kitchen Over the next 120 days, patient will work with SW to address concerns related to lack of care within the home  Interventions: . Patient and family interviewed and appropriate assessments performed . [name] and his wife are close church friends to patient for over 30 years and have been assisting him with his care. They live in [facility] but have been visit patent regularly. [name] number is [phone]. [name] took patient to his wound care office visit yesterday on 07/22/20. [name] wife is going to order patient some medical supplies which include a pill box for both morning and evenings. [name] was unaware of patient's urology appointment tomorrow and was appreciative of CCM [name]'s reminder. [name] and spouse will transport him there today on 06/26/20 at 1:30 pm. However, patient was a no show for this appointment. CCM [name] provided [name] with number for urologist which is [phone]. [name] is agreeable to contact office and reschedule appointment with [name]. CCM [name] advised [name] to return CCM Pharmacist's call and he was agreeable to this plan. Update- [name] reports that patient continues to turn his phone off so patient's sister is going to patient's house today to assist him and turn his  phone on. [name] reports that he and his wife continue to assist patient daily with his care giving needs. They were able to get all of his medications (besides the antibiotic for his foot) in a pill pack which will make medication management easier on the patient and family as a whole. [name] spouse has a few medication questions and wishes to contact CCM Pharmacist this week for education and assistance. Per [name], patient is doing a lot better since he has been taking his medications as prescribed. [name] reports that patient is more active and is able to get around the house more. [name] reports that [facility] is involved as well.   . Past update-CCM [name] was unable to reach patient or spouse but successfully spoke with his sister on 06/19/20. Sister reports that patient is doing better and that they go to church together each Sunday. She reports that she cares a lot for her brother and is there for him when needed but she is 84 years old and has her own health concerns.   . Provided patient and family with information about available personal care services within the area such as C.H.O.R.E program. Patient and family feel that he will not be eligible for Medicaid. Patient is now on wait list for C.H.O.R.E . Per [name], patient's wound care doctor was not pleased with how his right foot was wrapped by [facility] nurse.  Family are unsure if patient rewrapped this on his own or not.  . Discussed plans with patient for ongoing care management follow up and provided patient with direct contact information for care management team . Advised patient to considering hiring a private pay caregiver as Medicare does not cover these services . Assisted patient/caregiver with obtaining information about health plan benefits . Provided education and assistance to client regarding Advanced Directives. . Provided education to patient/caregiver regarding level of care options. . Patient was informed that current CCM [name] will  be leaving position next month and his next CCM Social Work follow up visit will be with another [name]. Patient was appreciative of support provided and receptive to news . Provided education to patient/caregiver about Hospice and/or Palliative Care services . [name] will place patient on wait list for C.H.O.R.E program during next scheduled CCM appointment---Application completed and patient was successfully added on the wait list for C.H.O.R.E program on 08/08/19. Patient was provided this update. Patient was provided Home Care Providers contact information in the case that he wishes to inquire where he is at on the wait list. Sister was provided this information as well on 06/19/20  Patient Self Care Activities:  . Attends all scheduled provider appointments . Calls provider office for new concerns or questions . Lacks social connections . Unable to perform ADLs independently . Unable to perform IADLs independently  Please see past updates related to this goal by clicking on the "Past Updates" button in the selected goal         Eula Fried, [place], MSW, [email or web address] Phone: [phone]

## 2020-07-23 NOTE — Chronic Care Management (AMB) (Signed)
Chronic Care Management    Clinical Social Work Note  07/23/2020 Name: [name] MRN: [id number] DOB: Sep 14, 1934  [name] is a 86 y.o. year old male who is a primary care patient of Jonathan Hauser, DO. The CCM team was consulted to assist the patient with chronic disease management and/or care coordination needs related to: Level of Care Concerns.   Collaboration with caregiver, [name] during outreach for follow up visit in response to provider referral for social work chronic care management and care coordination services.   Consent to Services:  The patient was given the following information about Chronic Care Management services today, agreed to services, and gave verbal consent: 1. CCM service includes personalized support from designated clinical staff supervised by the primary care provider, including individualized plan of care and coordination with other care providers 2. 24/7 contact phone numbers for assistance for urgent and routine care needs. 3. Service will only be billed when office clinical staff spend 20 minutes or more in a month to coordinate care. 4. Only one practitioner may furnish and bill the service in a calendar month. 5.The patient may stop CCM services at any time (effective at the end of the month) by phone call to the office staff. 6. The patient will be responsible for cost sharing (co-pay) of up to 20% of the service fee (after annual deductible is met). Patient agreed to services and consent obtained.  Patient agreed to services and consent obtained.   Assessment: Review of patient past medical history, allergies, medications, and health status, including review of relevant consultants reports was performed today as part of a comprehensive evaluation and provision of chronic care management and care coordination services.     SDOH (Social Determinants of Health) assessments and interventions performed:    Advanced Directives Status: See Care Plan  for related entries.  CCM Care Plan  No Known Allergies  Outpatient Encounter Medications as of 07/23/2020  Medication Sig  . aspirin EC 81 MG tablet Take 1 tablet (81 mg total) by mouth in the morning. (blood thinner)  . atorvastatin (LIPITOR) 40 MG tablet Take 1 tablet (40 mg total) by mouth at bedtime. (Cholesterol)  . clopidogrel (PLAVIX) 75 MG tablet Take 1 tablet (75 mg total) by mouth in the morning. (blood thinner)  . lisinopril (ZESTRIL) 20 MG tablet Take 1 tablet (20 mg total) by mouth in the morning. (Blood pressure)  . terazosin (HYTRIN) 5 MG capsule Take 1 capsule (5 mg total) by mouth at bedtime. (blood pressure, prostate)   No facility-administered encounter medications on file as of 07/23/2020.    Patient Active Problem List   Diagnosis Date Noted  . Cognitive deficit as late effect of cerebrovascular accident (CVA) 06/25/2020  . Essential hypertension 06/25/2020  . Imbalance 02/01/2018  . Humeral head fracture, left, closed, initial encounter 09/22/2017  . History of cerebrovascular accident (CVA) with residual deficit 09/09/2017  . CKD (chronic kidney disease), stage III ([place]) 09/01/2017  . Elevated PSA, between 10 and less than 20 ng/ml 07/22/2016  . Bilateral lower extremity edema 07/22/2016  . Abnormal glucose 07/15/2016  . BPH with obstruction/lower urinary tract symptoms 04/10/2016  . Benign hypertension with CKD (chronic kidney disease) stage III ([place]) 04/10/2016  . Memory loss 04/10/2016  . Gastric outlet obstruction 12/02/2014  . Generalized abdominal pain   . Benign fibroma of prostate 01/02/2011  . HLD (hyperlipidemia) 01/02/2011  . Obesity (BMI 30.0-34.9) 01/02/2011    Care Plan : General Social Work (  Adult)  Updates made by Jonathan Cutter, [name] since 07/23/2020 12:00 AM    Problem: Quality of Life (General Plan of Care)     Long-Range Goal: Quality of Life Maintained   Start Date: 06/19/2020  Priority: Medium  Note:   Timeframe:  Long-Range  Goal Priority:  Medium Start Date:   07/09/20                   Expected End Date: 10/09/20                   Follow Up Date- 09/04/20  Current Barrers:  . Limited social support . ADL IADL limitations . Social Isolation . Limited access to caregiver . Inability to perform ADL's independently . Inability to perform IADL's independently . Lacks knowledge of community resource: personal care services  Clinical Social Work Clinical Goal(s):  Marland Kitchen Over the next 120 days, patient will work with SW to address concerns related to lack of care within the home  Interventions: . Patient and family interviewed and appropriate assessments performed . [name] and his wife are close church friends to patient for over 30 years and have been assisting him with his care. They live in [facility] but have been visit patent regularly. [name]'s number is [phone]. [name] took patient to his wound care office visit yesterday on 07/22/20. [name]'s wife is going to order patient some medical supplies which include a pill box for both morning and evenings. [name] was unaware of patient's urology appointment tomorrow and was appreciative of CCM [name]'s reminder. [name] and spouse will transport him there today on 06/26/20 at 1:30 pm. However, patient was a no show for this appointment. CCM [name] provided [name] with number for urologist which is [phone]. [name] is agreeable to contact office and reschedule appointment with [name] CCM [name] advised [name] to return CCM Pharmacist's call and he was agreeable to this plan. Update- [name] reports that patient continues to turn his phone off so patient's sister is going to patient's house today to assist him and turn his phone on. [name] reports that he and his wife continue to assist patient daily with his care giving needs. They were able to get all of his medications (besides the antibiotic for his foot) in a pill pack which will make medication  management easier on the patient and family as a whole. [name]'s spouse has a few medication questions and wishes to contact CCM Pharmacist this week for education and assistance. Per [name], patient is doing a lot better since he has been taking his medications as prescribed. [name] reports that patient is more active and is able to get around the house more. [name] reports that [facility] is involved as well.   . Past update-CCM [name] was unable to reach patient or spouse but successfully spoke with his sister on 06/19/20. Sister reports that patient is doing better and that they go to church together each Sunday. She reports that she cares a lot for her brother and is there for him when needed but she is 84 years old and has her own health concerns.   . Provided patient and family with information about available personal care services within the area such as C.H.O.R.E program. Patient and family feel that he will not be eligible for Medicaid. Patient is now on wait list for C.H.O.R.E . Per [name], patient's wound care doctor was not pleased with how his right foot was wrapped by [facility] nurse. Family are unsure  if patient rewrapped this on his own or not.  . Discussed plans with patient for ongoing care management follow up and provided patient with direct contact information for care management team . Advised patient to considering hiring a private pay caregiver as Medicare does not cover these services . Assisted patient/caregiver with obtaining information about health plan benefits . Provided education and assistance to client regarding Advanced Directives. . Provided education to patient/caregiver regarding level of care options. . Patient was informed that current CCM [name] will be leaving position next month and his next CCM Social Work follow up visit will be with another [name]. Patient was appreciative of support provided and receptive to news . Provided education to patient/caregiver about Hospice and/or  Palliative Care services . [name] will place patient on wait list for C.H.O.R.E program during next scheduled CCM appointment---Application completed and patient was successfully added on the wait list for C.H.O.R.E program on 08/08/19. Patient was provided this update. Patient was provided Home Care Providers contact information in the case that he wishes to inquire where he is at on the wait list. Sister was provided this information as well on 06/19/20  Patient Self Care Activities:  . Attends all scheduled provider appointments . Calls provider office for new concerns or questions . Lacks social connections . Unable to perform ADLs independently . Unable to perform IADLs independently  Please see past updates related to this goal by clicking on the "Past Updates" button in the selected goal       Follow Up Plan: SW will follow up with patient by phone over the next quarter      Eula Fried, [place], MSW, [email or web address] Phone: [phone]

## 2020-07-24 DIAGNOSIS — I129 Hypertensive chronic kidney disease with stage 1 through stage 4 chronic kidney disease, or unspecified chronic kidney disease: Secondary | ICD-10-CM | POA: Diagnosis not present

## 2020-07-24 DIAGNOSIS — I69315 Cognitive social or emotional deficit following cerebral infarction: Secondary | ICD-10-CM | POA: Diagnosis not present

## 2020-07-24 DIAGNOSIS — R300 Dysuria: Secondary | ICD-10-CM | POA: Diagnosis not present

## 2020-07-24 DIAGNOSIS — E785 Hyperlipidemia, unspecified: Secondary | ICD-10-CM | POA: Diagnosis not present

## 2020-07-24 DIAGNOSIS — N183 Chronic kidney disease, stage 3 unspecified: Secondary | ICD-10-CM | POA: Diagnosis not present

## 2020-07-24 DIAGNOSIS — L89612 Pressure ulcer of right heel, stage 2: Secondary | ICD-10-CM | POA: Diagnosis not present

## 2020-07-26 DIAGNOSIS — I69315 Cognitive social or emotional deficit following cerebral infarction: Secondary | ICD-10-CM | POA: Diagnosis not present

## 2020-07-26 DIAGNOSIS — N183 Chronic kidney disease, stage 3 unspecified: Secondary | ICD-10-CM | POA: Diagnosis not present

## 2020-07-26 DIAGNOSIS — L89612 Pressure ulcer of right heel, stage 2: Secondary | ICD-10-CM | POA: Diagnosis not present

## 2020-07-26 DIAGNOSIS — I129 Hypertensive chronic kidney disease with stage 1 through stage 4 chronic kidney disease, or unspecified chronic kidney disease: Secondary | ICD-10-CM | POA: Diagnosis not present

## 2020-07-26 DIAGNOSIS — E785 Hyperlipidemia, unspecified: Secondary | ICD-10-CM | POA: Diagnosis not present

## 2020-07-26 DIAGNOSIS — R300 Dysuria: Secondary | ICD-10-CM | POA: Diagnosis not present

## 2020-07-26 NOTE — Progress Notes (Signed)
Jonathan, Nguyen ([id number]) Visit Report for 07/22/2020 Arrival Information Details [name], [name] Date of Service: 07/22/2020 12:45 PM Medical Record Number: [id number] Patient Account Number: [id number] Date of Birth/Sex: [birth year] (86 y.o. M) Treating RN: Dolan Amen Primary Care Albina Gosney: Nobie Putnam Other Clinician: Jeanine Luz Referring Navaya Wiatrek: Nobie Putnam Treating D'Arcy Abraha/Extender: Skipper Cliche in Treatment: 7 Visit Information History Since Last Visit Pain Present Now: No Patient Arrived: Ambulatory Arrival Time: 13:09 Accompanied By: self Transfer Assistance: None Patient Identification Verified: Yes Secondary Verification Process Completed: Yes Patient Has Alerts: Yes Patient Alerts: Not Diabetic Plavix Electronic Signature(s) Signed: 07/22/2020 4:55:51 PM By: Georges Mouse, Minus Breeding RN Entered By: Georges Mouse, Minus Breeding on 07/22/2020 13:11:06 Penning, [name] ([id number]) -------------------------------------------------------------------------------- Clinic Level of Care Assessment Details [name] Date of Service: 07/22/2020 12:45 PM Medical Record Number: [id number] Patient Account Number: [id number] Date of Birth/Sex: 28-May-1934 (86 y.o. M) Treating RN: Carlene Coria Primary Care Therisa Mennella: Nobie Putnam Other Clinician: Jeanine Luz Referring Kemarion Abbey: Nobie Putnam Treating Marynell Bies/Extender: Skipper Cliche in Treatment: 7 Clinic Level of Care Assessment Items TOOL 1 Quantity Score '[]'  - Use when EandM and Procedure is performed on INITIAL visit 0 ASSESSMENTS - Nursing Assessment / Reassessment '[]'  - General Physical Exam (combine w/ comprehensive assessment (listed just below) when performed on new 0 pt. evals) '[]'  - 0 Comprehensive Assessment (HX, ROS, Risk Assessments, Wounds Hx, etc.) ASSESSMENTS - Wound and Skin Assessment / Reassessment '[]'  - Dermatologic / Skin Assessment  (not related to wound area) 0 ASSESSMENTS - Ostomy and/or Continence Assessment and Care '[]'  - Incontinence Assessment and Management 0 '[]'  - 0 Ostomy Care Assessment and Management (repouching, etc.) PROCESS - Coordination of Care '[]'  - Simple Patient / Family Education for ongoing care 0 '[]'  - 0 Complex (extensive) Patient / Family Education for ongoing care '[]'  - 0 Staff obtains Programmer, systems, Records, Test Results / Process Orders '[]'  - 0 Staff telephones HHA, Nursing Homes / Clarify orders / etc '[]'  - 0 Routine Transfer to another Facility (non-emergent condition) '[]'  - 0 Routine Hospital Admission (non-emergent condition) '[]'  - 0 New Admissions / Biomedical engineer / Ordering NPWT, Apligraf, etc. '[]'  - 0 Emergency Hospital Admission (emergent condition) PROCESS - Special Needs '[]'  - Pediatric / Minor Patient Management 0 '[]'  - 0 Isolation Patient Management '[]'  - 0 Hearing / Language / Visual special needs '[]'  - 0 Assessment of Community assistance (transportation, D/C planning, etc.) '[]'  - 0 Additional assistance / Altered mentation '[]'  - 0 Support Surface(s) Assessment (bed, cushion, seat, etc.) INTERVENTIONS - Miscellaneous '[]'  - External ear exam 0 '[]'  - 0 Patient Transfer (multiple staff / Civil Service fast streamer / Similar devices) '[]'  - 0 Simple Staple / Suture removal (25 or less) '[]'  - 0 Complex Staple / Suture removal (26 or more) '[]'  - 0 Hypo/Hyperglycemic Management (do not check if billed separately) '[]'  - 0 Ankle / Brachial Index (ABI) - do not check if billed separately Has the patient been seen at the hospital within the last three years: Yes Total Score: 0 Level Of Care: ____ [name] ([id number]) Electronic Signature(s) Signed: 07/26/2020 8:10:37 AM By: Carlene Coria RN Entered By: Carlene Coria on 07/22/2020 13:41:01 Arauz, Real DMarland Kitchen ([id number]) -------------------------------------------------------------------------------- Compression Therapy Details [name] Date of Service: 07/22/2020 12:45 PM Medical Record Number: [id number] Patient Account Number: [id number] Date of Birth/Sex: Sep 10, 1934 (86 y.o. M) Treating RN: Carlene Coria Primary Care Timia Casselman: Nobie Putnam Other Clinician: Jeanine Luz Referring Kalley Nicholl:  Nobie Putnam Treating Danene Montijo/Extender: Jeri Cos Weeks in Treatment: 7 Compression Therapy Performed for Wound Assessment: Wound #1 Right,Lateral Foot Performed By: Clinician Carlene Coria, RN Compression Type: Three Layer Post Procedure Diagnosis Same as Pre-procedure Electronic Signature(s) Signed: 07/26/2020 8:10:37 AM By: Carlene Coria RN Entered By: Carlene Coria on 07/22/2020 13:36:42 Forgue, [name] ([id number]) -------------------------------------------------------------------------------- Encounter Discharge Information Details [name] Date of Service: 07/22/2020 12:45 PM Medical Record Number: [id number] Patient Account Number: [id number] Date of Birth/Sex: [birth year] (86 y.o. M) Treating RN: Dolan Amen Primary Care Kalub Morillo: Nobie Putnam Other Clinician: Jeanine Luz Referring Rahmel Nedved: Nobie Putnam Treating Marelyn Rouser/Extender: Skipper Cliche in Treatment: 7 Encounter Discharge Information Items Post Procedure Vitals Discharge Condition: Stable Temperature (F): 97.8 Ambulatory Status: Ambulatory Pulse (bpm): 69 Discharge Destination: Home Respiratory Rate (breaths/min): 18 Transportation: Private Auto Blood Pressure (mmHg): 172/83 Accompanied By: self Schedule Follow-up Appointment: Yes Clinical Summary of Care: Electronic Signature(s) Signed: 07/22/2020 4:55:51 PM By: Georges Mouse, Minus Breeding RN Entered By: Georges Mouse, Minus Breeding on 07/22/2020 13:47:50 Gorniak, [name] ([id number]) -------------------------------------------------------------------------------- Lower Extremity Assessment Details [name] Date of  Service: 07/22/2020 12:45 PM Medical Record Number: [id number] Patient Account Number: [id number] Date of Birth/Sex: 1934/06/25 (86 y.o. M) Treating RN: Dolan Amen Primary Care Lamone Ferrelli: Nobie Putnam Other Clinician: Jeanine Luz Referring Francois Elk: Nobie Putnam Treating Stevee Valenta/Extender: Skipper Cliche in Treatment: 7 Edema Assessment Assessed: Shirlyn Goltz: No] Patrice Paradise: Yes] Edema: [Left: Ye] [Right: s] Calf Left: Right: Point of Measurement: 31 cm From Medial Instep 38.5 cm Ankle Left: Right: Point of Measurement: 12 cm From Medial Instep 28.5 cm Vascular Assessment Pulses: Dorsalis Pedis Palpable: [Right:Yes] Electronic Signature(s) Signed: 07/22/2020 4:55:51 PM By: Georges Mouse, Minus Breeding RN Entered By: Georges Mouse, Minus Breeding on 07/22/2020 13:21:23 Gruenewald, [name] ([id number]) -------------------------------------------------------------------------------- Multi Wound Chart Details [name] Date of Service: 07/22/2020 12:45 PM Medical Record Number: [id number] Patient Account Number: [id number] Date of Birth/Sex: [birth year] (86 y.o. M) Treating RN: Carlene Coria Primary Care Kaylum Shrum: Nobie Putnam Other Clinician: Jeanine Luz Referring Ninel Abdella: Nobie Putnam Treating Tahjanae Blankenburg/Extender: Skipper Cliche in Treatment: 7 Vital Signs Height(in): 71 Pulse(bpm): 56 Weight(lbs): 216 Blood Pressure(mmHg): 172/83 Body Mass Index(BMI): 30 Temperature(F): 97.8 Respiratory Rate(breaths/min): 18 Photos: [1:No Photos] [N/A:N/A] Wound Location: [1:Right, Lateral Foot] [N/A:N/A] Wounding Event: [1:Pressure Injury] [N/A:N/A] Primary Etiology: [1:Pressure Ulcer] [N/A:N/A] Comorbid History: [1:Cataracts, Hypertension] [N/A:N/A] Date Acquired: [1:04/03/2020] [N/A:N/A] Weeks of Treatment: [1:7] [N/A:N/A] Wound Status: [1:Open] [N/A:N/A] Measurements L x W x D (cm) [1:5.4x3.3x0.2] [N/A:N/A] Area (cm) : [1:13.996]  [N/A:N/A] Volume (cm) : [1:2.799] [N/A:N/A] % Reduction in Area: [1:37.60%] [N/A:N/A] % Reduction in Volume: [1:75.00%] [N/A:N/A] Starting Position 1 (o'clock): [1:3] Ending Position 1 (o'clock): [1:6] Maximum Distance 1 (cm): [1:0.3] Undermining: [1:Yes] [N/A:N/A] Classification: [1:Unstageable/Unclassified] [N/A:N/A] Exudate Amount: [1:Medium] [N/A:N/A] Exudate Type: [1:Serosanguineous] [N/A:N/A] Exudate Color: [1:red, brown] [N/A:N/A] Wound Margin: [1:Thickened] [N/A:N/A] Granulation Amount: [1:Large (67-100%)] [N/A:N/A] Granulation Quality: [1:Red] [N/A:N/A] Necrotic Amount: [1:Small (1-33%)] [N/A:N/A] Exposed Structures: [1:Fat Layer (Subcutaneous Tissue): Yes Fascia: No Tendon: No Muscle: No Joint: No Bone: No None] [N/A:N/A N/A] Treatment Notes Electronic Signature(s) Signed: 07/26/2020 8:10:37 AM By: Carlene Coria RN Entered By: Carlene Coria on 07/22/2020 13:36:20 Florek, [name] ([id number]) -------------------------------------------------------------------------------- [place] Details [name] Date of Service: 07/22/2020 12:45 PM Medical Record Number: [id number] Patient Account Number: [id number] Date of Birth/Sex: 01-18-1935 (86 y.o. M) Treating RN: Carlene Coria Primary Care Hadyn Azer: Nobie Putnam Other Clinician: Jeanine Luz Referring Taevin Mcferran: Nobie Putnam Treating Diaz Crago/Extender: Skipper Cliche in  Treatment: 7 Active Inactive Wound/Skin Impairment Nursing Diagnoses: Knowledge deficit related to ulceration/compromised skin integrity Goals: Patient/caregiver will verbalize understanding of skin care regimen Date Initiated: 06/03/2020 Target Resolution Date: 08/01/2020 Goal Status: Active Ulcer/skin breakdown will have a volume reduction of 30% by week 4 Date Initiated: 06/03/2020 Date Inactivated: 07/01/2020 Target Resolution Date: 07/01/2020 Goal Status: Met Ulcer/skin breakdown will have a  volume reduction of 50% by week 8 Date Initiated: 07/01/2020 Target Resolution Date: 08/01/2020 Goal Status: Active Ulcer/skin breakdown will have a volume reduction of 80% by week 12 Date Initiated: 07/01/2020 Target Resolution Date: 08/31/2020 Goal Status: Active Ulcer/skin breakdown will heal within 14 weeks Date Initiated: 07/01/2020 Target Resolution Date: 10/01/2020 Goal Status: Active Interventions: Assess patient/caregiver ability to obtain necessary supplies Assess patient/caregiver ability to perform ulcer/skin care regimen upon admission and as needed Assess ulceration(s) every visit Notes: Electronic Signature(s) Signed: 07/26/2020 8:10:37 AM By: Carlene Coria RN Entered By: Carlene Coria on 07/22/2020 13:36:10 Jemmott, [name] ([id number]) -------------------------------------------------------------------------------- Pain Assessment Details [name] Date of Service: 07/22/2020 12:45 PM Medical Record Number: [id number] Patient Account Number: [id number] Date of Birth/Sex: [birth year] (86 y.o. M) Treating RN: Dolan Amen Primary Care Kortez Murtagh: Nobie Putnam Other Clinician: Jeanine Luz Referring Arleen Bar: Nobie Putnam Treating Mesa Janus/Extender: Skipper Cliche in Treatment: 7 Active Problems Location of Pain Severity and Description of Pain Patient Has Paino No Site Locations Rate the pain. Current Pain Level: 0 Pain Management and Medication Current Pain Management: Electronic Signature(s) Signed: 07/22/2020 4:55:51 PM By: Georges Mouse, Minus Breeding RN Entered By: Georges Mouse, Minus Breeding on 07/22/2020 13:12:05 Marez, [name] ([id number]) -------------------------------------------------------------------------------- Patient/Caregiver Education Details [name] Date of Service: 07/22/2020 12:45 PM Medical Record Number: [id number] Patient Account Number: [id number] Date of Birth/Gender: [birth year] (85 y.o.  M) Treating RN: Carlene Coria Primary Care Physician: Nobie Putnam Other Clinician: Jeanine Luz Referring Physician: Nobie Putnam Treating Physician/Extender: Skipper Cliche in Treatment: 7 Education Assessment Education Provided To: Patient Education Topics Provided Wound/Skin Impairment: Methods: Explain/Verbal Responses: State content correctly Electronic Signature(s) Signed: 07/26/2020 8:10:37 AM By: Carlene Coria RN Entered By: Carlene Coria on 07/22/2020 13:41:14 Portee, [name] ([id number]) -------------------------------------------------------------------------------- Wound Assessment Details [name] Date of Service: 07/22/2020 12:45 PM Medical Record Number: [id number] Patient Account Number: [id number] Date of Birth/Sex: [birth year] (86 y.o. M) Treating RN: Dolan Amen Primary Care Kinsley Nicklaus: Nobie Putnam Other Clinician: Jeanine Luz Referring Victoriah Wilds: Nobie Putnam Treating Shakerra Red/Extender: Jeri Cos Weeks in Treatment: 7 Wound Status Wound Number: 1 Primary Etiology: Pressure Ulcer Wound Location: Right, Lateral Foot Wound Status: Open Wounding Event: Pressure Injury Comorbid History: Cataracts, Hypertension Date Acquired: 04/03/2020 Weeks Of Treatment: 7 Clustered Wound: No Photos Photo Uploaded By: Georges Mouse, Minus Breeding on 07/22/2020 13:59:15 Wound Measurements Length: (cm) 5.4 Width: (cm) 3.3 Depth: (cm) 0.2 Area: (cm) 13.996 Volume: (cm) 2.799 % Reduction in Area: 37.6% % Reduction in Volume: 75% Epithelialization: None Tunneling: No Undermining: Yes Starting Position (o'clock): 3 Ending Position (o'clock): 6 Maximum Distance: (cm) 0.3 Wound Description Classification: Unstageable/Unclassified Wound Margin: Thickened Exudate Amount: Medium Exudate Type: Serosanguineous Exudate Color: red, brown Foul Odor After Cleansing: No Slough/Fibrino Yes Wound Bed Granulation  Amount: Large (67-100%) Exposed Structure Granulation Quality: Red Fascia Exposed: No Necrotic Amount: Small (1-33%) Fat Layer (Subcutaneous Tissue) Exposed: Yes Necrotic Quality: Adherent Slough Tendon Exposed: No Muscle Exposed: No Joint Exposed: No Bone Exposed: No Treatment Notes Wound #1 (Foot) Wound Laterality: Right, Lateral Dimascio, Jakwan D. ([id number]) Cleanser [place] Ancillary Kit - 15 Day  Supply Discharge Instruction: Use supplies as instructed; Kit contains: (15) Saline Bullets; (15) 3x3 Gauze; 15 pr Gloves Normal Saline Discharge Instruction: Wash your hands with soap and water. Remove old dressing, discard into plastic bag and place into trash. Cleanse the wound with Normal Saline prior to applying a clean dressing using gauze sponges, not tissues or cotton balls. Do not scrub or use excessive force. Pat dry using gauze sponges, not tissue or cotton balls. Peri-Wound Care Topical Primary Dressing Hydrofera Blue Ready Transfer Foam, 2.5x2.5 (in/in) Discharge Instruction: Apply Hydrofera Blue Ready to wound bed as directed Secondary Dressing ABD Pad 5x9 (in/in) Discharge Instruction: Cover with ABD pad Secured With Compression Wrap Profore Lite LF 3 Multilayer Compression [place] Discharge Instruction: Apply 3 multi-layer wrap as prescribed. Compression Stockings Add-Ons Electronic Signature(s) Signed: 07/22/2020 4:55:51 PM By: Georges Mouse, Minus Breeding RN Entered By: Georges Mouse, Minus Breeding on 07/22/2020 13:19:51 Isaacson, [name] ([id number]) -------------------------------------------------------------------------------- Vitals Details [name] Date of Service: 07/22/2020 12:45 PM Medical Record Number: [id number] Patient Account Number: [id number] Date of Birth/Sex: 29-Jan-1935 (86 y.o. M) Treating RN: Dolan Amen Primary Care Wood Novacek: Nobie Putnam Other Clinician: Jeanine Luz Referring Ettel Albergo: Nobie Putnam Treating Ellean Firman/Extender: Skipper Cliche in Treatment: 7 Vital Signs Time Taken: 13:11 Temperature (F): 97.8 Height (in): 71 Pulse (bpm): 69 Weight (lbs): 216 Respiratory Rate (breaths/min): 18 Body Mass Index (BMI): 30.1 Blood Pressure (mmHg): 172/83 Reference Range: 80 - 120 mg / dl Electronic Signature(s) Signed: 07/22/2020 4:55:51 PM By: Georges Mouse, Minus Breeding RN Entered By: Georges Mouse, Minus Breeding on 07/22/2020 13:11:59

## 2020-07-26 NOTE — Progress Notes (Signed)
CRUISE, BAUMGARDNER ([id number]) Visit Report for 07/08/2020 Arrival Information Details [name], [name] Date of Service: 07/08/2020 10:30 AM Medical Record Number: [id number] Patient Account Number: [id number] Date of Birth/Sex: 10-25-1934 (86 y.o. M) Treating RN: Dolan Amen Primary Care Vaani Morren: Nobie Putnam Other Clinician: Jeanine Luz Referring Donnivan Villena: Nobie Putnam Treating Coley Littles/Extender: Skipper Cliche in Treatment: 5 Visit Information History Since Last Visit Pain Present Now: No Patient Arrived: Ambulatory Arrival Time: 10:49 Accompanied By: wife Transfer Assistance: None Patient Identification Verified: Yes Secondary Verification Process Completed: Yes Patient Has Alerts: Yes Patient Alerts: Not Diabetic Plavix Electronic Signature(s) Signed: 07/08/2020 4:25:39 PM By: Georges Mouse, Minus Breeding RN Entered By: Georges Mouse, Minus Breeding on 07/08/2020 10:49:19 Doucette, [name] ([id number]) -------------------------------------------------------------------------------- Clinic Level of Care Assessment Details [name] Date of Service: 07/08/2020 10:30 AM Medical Record Number: [id number] Patient Account Number: [id number] Date of Birth/Sex: [birth year] (86 y.o. M) Treating RN: Carlene Coria Primary Care Shaneeka Scarboro: Nobie Putnam Other Clinician: Jeanine Luz Referring Kiyonna Tortorelli: Nobie Putnam Treating Aundrea Higginbotham/Extender: Skipper Cliche in Treatment: 5 Clinic Level of Care Assessment Items TOOL 1 Quantity Score _0  - Use when EandM and Procedure is performed on INITIAL visit 0 ASSESSMENTS - Nursing Assessment / Reassessment _1  - General Physical Exam (combine w/ comprehensive assessment (listed just below) when performed on new 0 pt. evals) _2  - 0 Comprehensive Assessment (HX, ROS, Risk Assessments, Wounds Hx, etc.) ASSESSMENTS - Wound and Skin Assessment / Reassessment _3  - Dermatologic / Skin  Assessment (not related to wound area) 0 ASSESSMENTS - Ostomy and/or Continence Assessment and Care _4  - Incontinence Assessment and Management 0 _5  - 0 Ostomy Care Assessment and Management (repouching, etc.) PROCESS - Coordination of Care _6  - Simple Patient / Family Education for ongoing care 0 _7  - 0 Complex (extensive) Patient / Family Education for ongoing care _8  - 0 Staff obtains Programmer, systems, Records, Test Results / Process Orders _9  - 0 Staff telephones HHA, Nursing Homes / Clarify orders / etc _10  - 0 Routine Transfer to another Facility (non-emergent condition) _11  - 0 Routine Hospital Admission (non-emergent condition) _12  - 0 New Admissions / Biomedical engineer / Ordering NPWT, Apligraf, etc. _13  - 0 Emergency Hospital Admission (emergent condition) PROCESS - Special Needs _14  - Pediatric / Minor Patient Management 0 _15  - 0 Isolation Patient Management _16  - 0 Hearing / Language / Visual special needs _17  - 0 Assessment of Community assistance (transportation, D/C planning, etc.) _18  - 0 Additional assistance / Altered mentation _19  - 0 Support Surface(s) Assessment (bed, cushion, seat, etc.) INTERVENTIONS - Miscellaneous _20  - External ear exam 0 _21  - 0 Patient Transfer (multiple staff / Civil Service fast streamer / Similar devices) _22  - 0 Simple Staple / Suture removal (25 or less) _23  - 0 Complex Staple / Suture removal (26 or more) _24  - 0 Hypo/Hyperglycemic Management (do not check if billed separately) _25  - 0 Ankle / Brachial Index (ABI) - do not check if billed separately Has the patient been seen at the hospital within the last three years: Yes Total Score: 0 Level Of Care: ____ [name] ([id number]) Electronic Signature(s) Signed: 07/26/2020 8:11:40 AM By: Carlene Coria RN Entered By: Carlene Coria on 07/08/2020 11:17:28 Schueller, Ziquan DMarland Kitchen ([id number]) -------------------------------------------------------------------------------- Compression Therapy Details Patient  Name: [name] Date of Service: 07/08/2020 10:30 AM Medical Record Number: [id number] Patient Account Number: [id number] Date of Birth/Sex: 22-Aug-1934 (86 y.o. M) Treating RN: Carlene Coria Primary Care Gotham Raden: Nobie Putnam Other Clinician: Jeanine Luz Referring Ardell Aaronson:  Nobie Putnam Treating Kelson Queenan/Extender: Jeri Cos Weeks in Treatment: 5 Compression Therapy Performed for Wound Assessment: Wound #1 Right,Lateral Foot Performed By: Clinician Carlene Coria, RN Compression Type: Three Layer Post Procedure Diagnosis Same as Pre-procedure Electronic Signature(s) Signed: 07/26/2020 8:11:40 AM By: Carlene Coria RN Entered By: Carlene Coria on 07/08/2020 11:15:43 Helzer, [name] ([id number]) -------------------------------------------------------------------------------- Encounter Discharge Information Details [name] Date of Service: 07/08/2020 10:30 AM Medical Record Number: [id number] Patient Account Number: [id number] Date of Birth/Sex: 08-Jun-1934 (86 y.o. M) Treating RN: Carlene Coria Primary Care Shavar Gorka: Nobie Putnam Other Clinician: Jeanine Luz Referring Dushawn Pusey: Nobie Putnam Treating Ronna Herskowitz/Extender: Skipper Cliche in Treatment: 5 Encounter Discharge Information Items Discharge Condition: Stable Ambulatory Status: Ambulatory Discharge Destination: Home Transportation: Private Auto Accompanied By: wife in lobby Schedule Follow-up Appointment: Yes Clinical Summary of Care: Electronic Signature(s) Signed: 07/08/2020 1:24:47 PM By: Jeanine Luz Entered By: Jeanine Luz on 07/08/2020 13:24:47 Gero, [name] ([id number]) -------------------------------------------------------------------------------- Lower Extremity Assessment Details [name] Date of Service: 07/08/2020 10:30 AM Medical Record Number: [id number] Patient Account Number: [id number] Date of Birth/Sex: 1935-01-26  (86 y.o. M) Treating RN: Dolan Amen Primary Care Kasra Melvin: Nobie Putnam Other Clinician: Jeanine Luz Referring Rolando Hessling: Nobie Putnam Treating Ajee Heasley/Extender: Skipper Cliche in Treatment: 5 Edema Assessment Assessed: Shirlyn Goltz: No] Patrice Paradise: Yes] Edema: [Left: Ye] [Right: s] Calf Left: Right: Point of Measurement: 31 cm From Medial Instep 37 cm Ankle Left: Right: Point of Measurement: 12 cm From Medial Instep 26.4 cm Vascular Assessment Pulses: Dorsalis Pedis Palpable: [Right:Yes] Electronic Signature(s) Signed: 07/08/2020 4:25:39 PM By: Georges Mouse, Minus Breeding RN Entered By: Georges Mouse, Minus Breeding on 07/08/2020 10:57:45 Rockefeller, [name] ([id number]) -------------------------------------------------------------------------------- Multi Wound Chart Details [name] Date of Service: 07/08/2020 10:30 AM Medical Record Number: [id number] Patient Account Number: [id number] Date of Birth/Sex: 04-29-1934 (86 y.o. M) Treating RN: Carlene Coria Primary Care Keylen Eckenrode: Nobie Putnam Other Clinician: Jeanine Luz Referring Firmin Belisle: Nobie Putnam Treating Antino Mayabb/Extender: Skipper Cliche in Treatment: 5 Vital Signs Height(in): 71 Pulse(bpm): 80 Weight(lbs): 216 Blood Pressure(mmHg): 91/60 Body Mass Index(BMI): 30 Temperature(F): 97.7 Respiratory Rate(breaths/min): 16 Photos: [N/A:N/A] Wound Location: Right, Lateral Foot N/A N/A Wounding Event: Pressure Injury N/A N/A Primary Etiology: Pressure Ulcer N/A N/A Comorbid History: Cataracts, Hypertension N/A N/A Date Acquired: 04/03/2020 N/A N/A Weeks of Treatment: 5 N/A N/A Wound Status: Open N/A N/A Measurements L x W x D (cm) 5x4.5x0.2 N/A N/A Area (cm) : 17.671 N/A N/A Volume (cm) : 3.534 N/A N/A % Reduction in Area: 21.20% N/A N/A % Reduction in Volume: 68.50% N/A N/A Classification: Unstageable/Unclassified N/A N/A Exudate Amount: Medium N/A N/A Exudate  Type: Serosanguineous N/A N/A Exudate Color: red, brown N/A N/A Wound Margin: Thickened N/A N/A Granulation Amount: Medium (34-66%) N/A N/A Granulation Quality: Red, Pink, Hyper-granulation N/A N/A Necrotic Amount: Medium (34-66%) N/A N/A Necrotic Tissue: Eschar, Adherent Slough N/A N/A Exposed Structures: Fat Layer (Subcutaneous Tissue): N/A N/A Yes Fascia: No Tendon: No Muscle: No Joint: No Bone: No Epithelialization: None N/A N/A Treatment Notes Electronic Signature(s) Signed: 07/26/2020 8:11:40 AM By: Carlene Coria RN Entered By: Carlene Coria on 07/08/2020 11:14:37 Eichinger, [name] ([id number]) -------------------------------------------------------------------------------- [place] Details [name] Date of Service: 07/08/2020 10:30 AM Medical Record Number: [id number] Patient Account Number: [id number] Date of Birth/Sex: 1934/12/31 (86 y.o. M) Treating RN: Carlene Coria Primary Care Besse Miron: Nobie Putnam Other Clinician: Jeanine Luz Referring Kacper Cartlidge: Nobie Putnam Treating Tykeshia Tourangeau/Extender: Skipper Cliche in Treatment: 5 Active Inactive Wound/Skin Impairment  Nursing Diagnoses: Knowledge deficit related to ulceration/compromised skin integrity Goals: Patient/caregiver will verbalize understanding of skin care regimen Date Initiated: 06/03/2020 Target Resolution Date: 08/01/2020 Goal Status: Active Ulcer/skin breakdown will have a volume reduction of 30% by week 4 Date Initiated: 06/03/2020 Date Inactivated: 07/01/2020 Target Resolution Date: 07/01/2020 Goal Status: Met Ulcer/skin breakdown will have a volume reduction of 50% by week 8 Date Initiated: 07/01/2020 Target Resolution Date: 08/01/2020 Goal Status: Active Ulcer/skin breakdown will have a volume reduction of 80% by week 12 Date Initiated: 07/01/2020 Target Resolution Date: 08/31/2020 Goal Status: Active Ulcer/skin breakdown will heal within 14  weeks Date Initiated: 07/01/2020 Target Resolution Date: 10/01/2020 Goal Status: Active Interventions: Assess patient/caregiver ability to obtain necessary supplies Assess patient/caregiver ability to perform ulcer/skin care regimen upon admission and as needed Assess ulceration(s) every visit Notes: Electronic Signature(s) Signed: 07/26/2020 8:11:40 AM By: Carlene Coria RN Entered By: Carlene Coria on 07/08/2020 11:14:30 Glymph, Kolt DMarland Kitchen ([id number]) -------------------------------------------------------------------------------- Pain Assessment Details [name] Date of Service: 07/08/2020 10:30 AM Medical Record Number: [id number] Patient Account Number: [id number] Date of Birth/Sex: [birth year] (86 y.o. M) Treating RN: Dolan Amen Primary Care Jaelin Fackler: Nobie Putnam Other Clinician: Jeanine Luz Referring Marvyn Torrez: Nobie Putnam Treating Dail Meece/Extender: Skipper Cliche in Treatment: 5 Active Problems Location of Pain Severity and Description of Pain Patient Has Paino No Site Locations Rate the pain. Current Pain Level: 0 Pain Management and Medication Current Pain Management: Electronic Signature(s) Signed: 07/08/2020 4:25:39 PM By: Georges Mouse, Minus Breeding RN Entered By: Georges Mouse, Minus Breeding on 07/08/2020 10:50:11 Gallo, [name] ([id number]) -------------------------------------------------------------------------------- Patient/Caregiver Education Details [name] Date of Service: 07/08/2020 10:30 AM Medical Record Number: [id number] Patient Account Number: [id number] Date of Birth/Gender: 10-Jun-1934 (86 y.o. M) Treating RN: Carlene Coria Primary Care Physician: Nobie Putnam Other Clinician: Jeanine Luz Referring Physician: Nobie Putnam Treating Physician/Extender: Skipper Cliche in Treatment: 5 Education Assessment Education Provided To: Patient Education Topics Provided Wound/Skin  Impairment: Methods: Explain/Verbal Responses: State content correctly Electronic Signature(s) Signed: 07/26/2020 8:11:40 AM By: Carlene Coria RN Entered By: Carlene Coria on 07/08/2020 11:17:55 Bronder, Grafton DMarland Kitchen ([id number]) -------------------------------------------------------------------------------- Wound Assessment Details [name] Date of Service: 07/08/2020 10:30 AM Medical Record Number: [id number] Patient Account Number: [id number] Date of Birth/Sex: May 03, 1934 (86 y.o. M) Treating RN: Dolan Amen Primary Care Marquese Burkland: Nobie Putnam Other Clinician: Jeanine Luz Referring Kayann Maj: Nobie Putnam Treating Kaenan Jake/Extender: Skipper Cliche in Treatment: 5 Wound Status Wound Number: 1 Primary Etiology: Pressure Ulcer Wound Location: Right, Lateral Foot Wound Status: Open Wounding Event: Pressure Injury Comorbid History: Cataracts, Hypertension Date Acquired: 04/03/2020 Weeks Of Treatment: 5 Clustered Wound: No Photos Wound Measurements Length: (cm) 5 Width: (cm) 4.5 Depth: (cm) 0.2 Area: (cm) 17.671 Volume: (cm) 3.534 % Reduction in Area: 21.2% % Reduction in Volume: 68.5% Epithelialization: None Tunneling: No Undermining: No Wound Description Classification: Unstageable/Unclassified Wound Margin: Thickened Exudate Amount: Medium Exudate Type: Serosanguineous Exudate Color: red, brown Foul Odor After Cleansing: No Slough/Fibrino Yes Wound Bed Granulation Amount: Medium (34-66%) Exposed Structure Granulation Quality: Red, Pink, Hyper-granulation Fascia Exposed: No Necrotic Amount: Medium (34-66%) Fat Layer (Subcutaneous Tissue) Exposed: Yes Necrotic Quality: Eschar, Adherent Slough Tendon Exposed: No Muscle Exposed: No Joint Exposed: No Bone Exposed: No Electronic Signature(s) Signed: 07/08/2020 4:25:39 PM By: Georges Mouse, Minus Breeding RN Entered By: Georges Mouse, Minus Breeding on 07/08/2020 10:55:58 Lanes, [name]  ([id number]) -------------------------------------------------------------------------------- Vitals Details [name] Date of Service: 07/08/2020 10:30 AM Medical Record Number: [id number] Patient Account Number:  [id number] Date of Birth/Sex: Jun 05, 1934 (86 y.o. M) Treating RN: Dolan Amen Primary Care Richie Bonanno: Nobie Putnam Other Clinician: Jeanine Luz Referring Ltanya Bayley: Nobie Putnam Treating Tarek Cravens/Extender: Skipper Cliche in Treatment: 5 Vital Signs Time Taken: 10:49 Temperature (F): 97.7 Height (in): 71 Pulse (bpm): 80 Weight (lbs): 216 Respiratory Rate (breaths/min): 16 Body Mass Index (BMI): 30.1 Blood Pressure (mmHg): 91/60 Reference Range: 80 - 120 mg / dl Electronic Signature(s) Signed: 07/08/2020 4:25:39 PM By: Georges Mouse, Minus Breeding RN Entered By: Georges Mouse, Minus Breeding on 07/08/2020 10:50:05

## 2020-07-29 ENCOUNTER — Other Ambulatory Visit: Payer: Self-pay

## 2020-07-29 ENCOUNTER — Encounter: Payer: Medicare Other | Admitting: Physician Assistant

## 2020-07-29 DIAGNOSIS — L89610 Pressure ulcer of right heel, unstageable: Secondary | ICD-10-CM | POA: Diagnosis not present

## 2020-07-29 DIAGNOSIS — L89890 Pressure ulcer of other site, unstageable: Secondary | ICD-10-CM | POA: Diagnosis not present

## 2020-07-29 DIAGNOSIS — I10 Essential (primary) hypertension: Secondary | ICD-10-CM | POA: Diagnosis not present

## 2020-07-29 NOTE — Progress Notes (Addendum)
RUDI, BUNYARD ([id number]) Visit Report for 07/29/2020 Arrival Information Details [name], [name] Date of Service: 07/29/2020 10:30 AM Medical Record Number: [id number] Patient Account Number: [id number] Date of Birth/Sex: [birth year] (85 y.o. M) Treating RN: Donnamarie Poag Primary Care Maloree Uplinger: Nobie Putnam Other Clinician: Jeanine Luz Referring Izear Pine: Nobie Putnam Treating Kasai Beltran/Extender: Skipper Cliche in Treatment: 8 Visit Information History Since Last Visit Added or deleted any medications: No Patient Arrived: Ambulatory Had a fall or experienced change in No Arrival Time: 10:27 activities of daily living that may affect Accompanied By: self risk of falls: Transfer Assistance: None Hospitalized since last visit: No Patient Identification Verified: Yes Has Dressing in Place as Prescribed: Yes Secondary Verification Process Completed: Yes Pain Present Now: No Patient Has Alerts: Yes Patient Alerts: Not Diabetic Plavix Electronic Signature(s) Signed: 07/29/2020 4:36:12 PM By: Donnamarie Poag Entered By: Donnamarie Poag on 07/29/2020 10:27:44 Shull, [name] ([id number]) -------------------------------------------------------------------------------- Clinic Level of Care Assessment Details Patient Name: [name] Date of Service: 07/29/2020 10:30 AM Medical Record Number: [id number] Patient Account Number: [id number] Date of Birth/Sex: 07-28-1934 (85 y.o. M) Treating RN: Carlene Coria Primary Care Shalissa Easterwood: Nobie Putnam Other Clinician: Jeanine Luz Referring Gionni Vaca: Nobie Putnam Treating Illianna Paschal/Extender: Skipper Cliche in Treatment: 8 Clinic Level of Care Assessment Items TOOL 4 Quantity Score X - Use when only an EandM is performed on FOLLOW-UP visit 1 0 ASSESSMENTS - Nursing Assessment / Reassessment X - Reassessment of Co-morbidities (includes updates in patient status) 1 10 X- 1 5 Reassessment of  Adherence to Treatment Plan ASSESSMENTS - Wound and Skin Assessment / Reassessment X - Simple Wound Assessment / Reassessment - one wound 1 5 _0  - 0 Complex Wound Assessment / Reassessment - multiple wounds _1  - 0 Dermatologic / Skin Assessment (not related to wound area) ASSESSMENTS - Focused Assessment _2  - Circumferential Edema Measurements - multi extremities 0 _3  - 0 Nutritional Assessment / Counseling / Intervention _4  - 0 Lower Extremity Assessment (monofilament, tuning fork, pulses) _5  - 0 Peripheral Arterial Disease Assessment (using hand held doppler) ASSESSMENTS - Ostomy and/or Continence Assessment and Care _6  - Incontinence Assessment and Management 0 _7  - 0 Ostomy Care Assessment and Management (repouching, etc.) PROCESS - Coordination of Care X - Simple Patient / Family Education for ongoing care 1 15 _8  - 0 Complex (extensive) Patient / Family Education for ongoing care _9  - 0 Staff obtains Programmer, systems, Records, Test Results / Process Orders _10  - 0 Staff telephones HHA, Nursing Homes / Clarify orders / etc _11  - 0 Routine Transfer to another Facility (non-emergent condition) _12  - 0 Routine Hospital Admission (non-emergent condition) _13  - 0 New Admissions / Biomedical engineer / Ordering NPWT, Apligraf, etc. _14  - 0 Emergency Hospital Admission (emergent condition) X- 1 10 Simple Discharge Coordination _15  - 0 Complex (extensive) Discharge Coordination PROCESS - Special Needs _16  - Pediatric / Minor Patient Management 0 _17  - 0 Isolation Patient Management _18  - 0 Hearing / Language / Visual special needs _19  - 0 Assessment of Community assistance (transportation, D/C planning, etc.) _20  - 0 Additional assistance / Altered mentation _21  - 0 Support Surface(s) Assessment (bed, cushion, seat, etc.) INTERVENTIONS - Wound Cleansing / Measurement Coor, Willmar D. ([id number]) X- 1 5 Simple Wound Cleansing - one wound _22  - 0 Complex Wound Cleansing - multiple  wounds X- 1 5 Wound Imaging (photographs - any number of wounds) _23  - 0 Wound Tracing (instead of photographs) X- 1 5 Simple Wound Measurement - one wound _24  -  0 Complex Wound Measurement - multiple wounds INTERVENTIONS - Wound Dressings _0  - Small Wound Dressing one or multiple wounds 0 X- 1 15 Medium Wound Dressing one or multiple wounds _1  - 0 Large Wound Dressing one or multiple wounds X- 1 5 Application of Medications - topical <ZOXWRUEAVWUJWJXB>_1<\/YNWGNFAOZHYQMVHQ>_4  - 0 Application of Medications - injection INTERVENTIONS - Miscellaneous _3  - External ear exam 0 _4  - 0 Specimen Collection (cultures, biopsies, blood, body fluids, etc.) _5  - 0 Specimen(s) / Culture(s) sent or taken to Lab for analysis _6  - 0 Patient Transfer (multiple staff / Civil Service fast streamer / Similar devices) _7  - 0 Simple Staple / Suture removal (25 or less) _8  - 0 Complex Staple / Suture removal (26 or more) _9  - 0 Hypo / Hyperglycemic Management (close monitor of Blood Glucose) _10  - 0 Ankle / Brachial Index (ABI) - do not check if billed separately X- 1 5 Vital Signs Has the patient been seen at the hospital within the last three years: Yes Total Score: 85 Level Of Care: New/Established - Level 3 Electronic Signature(s) Signed: 07/29/2020 5:13:28 PM By: Carlene Coria RN Entered By: Carlene Coria on 07/29/2020 10:53:39 Horan, [name] ([id number]) -------------------------------------------------------------------------------- Compression Therapy Details Patient Name: [name] Date of Service: 07/29/2020 10:30 AM Medical Record Number: [id number] Patient Account Number: [id number] Date of Birth/Sex: 06-10-1934 (85 y.o. M) Treating RN: Carlene Coria Primary Care Jianni Shelden: Nobie Putnam Other Clinician: Jeanine Luz Referring Shanelle Clontz: Nobie Putnam Treating Damin Salido/Extender: Skipper Cliche in Treatment: 8 Compression Therapy Performed for Wound Assessment: Wound #1 Right,Lateral Foot Performed By:  Jake Church, RN Compression Type: Three Layer Post Procedure Diagnosis Same as Pre-procedure Electronic Signature(s) Signed: 07/29/2020 5:13:28 PM By: Carlene Coria RN Entered By: Carlene Coria on 07/29/2020 10:52:58 Boyett, [name] ([id number]) -------------------------------------------------------------------------------- Encounter Discharge Information Details Patient Name: [name] Date of Service: 07/29/2020 10:30 AM Medical Record Number: [id number] Patient Account Number: [id number] Date of Birth/Sex: 11-27-1934 (85 y.o. M) Treating RN: Dolan Amen Primary Care Judith Campillo: Nobie Putnam Other Clinician: Jeanine Luz Referring Nastasha Reising: Nobie Putnam Treating Navi Erber/Extender: Skipper Cliche in Treatment: 8 Encounter Discharge Information Items Discharge Condition: Stable Ambulatory Status: Ambulatory Discharge Destination: Home Transportation: Private Auto Accompanied By: self Schedule Follow-up Appointment: Yes Clinical Summary of Care: Electronic Signature(s) Signed: 07/29/2020 3:58:09 PM By: Georges Mouse, Minus Breeding RN Entered By: Georges Mouse, Minus Breeding on 07/29/2020 11:31:13 Ruzich, [name] ([id number]) -------------------------------------------------------------------------------- Lower Extremity Assessment Details Patient Name: [name] Date of Service: 07/29/2020 10:30 AM Medical Record Number: [id number] Patient Account Number: [id number] Date of Birth/Sex: Jun 08, 1934 (85 y.o. M) Treating RN: Donnamarie Poag Primary Care Pierra Skora: Nobie Putnam Other Clinician: Jeanine Luz Referring Teondre Jarosz: Nobie Putnam Treating Phyillis Dascoli/Extender: Skipper Cliche in Treatment: 8 Edema Assessment Assessed: [Left: No] Patrice Paradise: Yes] Edema: [Left: Ye] [Right: s] Calf Left: Right: Point of Measurement: 31 cm From Medial Instep 36 cm Ankle Left: Right: Point of Measurement: 12 cm From Medial Instep 26.4 cm Vascular  Assessment Pulses: Dorsalis Pedis Palpable: [Right:Yes] Electronic Signature(s) Signed: 07/29/2020 4:36:12 PM By: Donnamarie Poag Entered By: Donnamarie Poag on 07/29/2020 10:37:13 Fulford, [name] ([id number]) -------------------------------------------------------------------------------- Multi Wound Chart Details Patient Name: [name] Date of Service: 07/29/2020 10:30 AM Medical Record Number: [id number] Patient Account Number: [id number] Date of Birth/Sex: 05-01-1934 (85 y.o. M) Treating RN: Carlene Coria Primary Care Rayvon Brandvold: Nobie Putnam Other Clinician: Jeanine Luz Referring Canaan Prue: Nobie Putnam Treating Mattix Imhof/Extender: Skipper Cliche in Treatment: 8 Vital Signs Height(in): 71 Pulse(bpm): 79 Weight(lbs): 216 Blood Pressure(mmHg): 153/85  Body Mass Index(BMI): 30 Temperature(F): 97.9 Respiratory Rate(breaths/min): 18 Photos: [N/A:N/A] Wound Location: Right, Lateral Foot N/A N/A Wounding Event: Pressure Injury N/A N/A Primary Etiology: Pressure Ulcer N/A N/A Comorbid History: Cataracts, Hypertension N/A N/A Date Acquired: 04/03/2020 N/A N/A Weeks of Treatment: 8 N/A N/A Wound Status: Open N/A N/A Measurements L x W x D (cm) 4.8x3.2x0.2 N/A N/A Area (cm) : 12.064 N/A N/A Volume (cm) : 2.413 N/A N/A % Reduction in Area: 46.20% N/A N/A % Reduction in Volume: 78.50% N/A N/A Classification: Unstageable/Unclassified N/A N/A Exudate Amount: Medium N/A N/A Exudate Type: Serosanguineous N/A N/A Exudate Color: red, brown N/A N/A Wound Margin: Thickened N/A N/A Granulation Amount: Large (67-100%) N/A N/A Granulation Quality: Red N/A N/A Necrotic Amount: Small (1-33%) N/A N/A Exposed Structures: Fat Layer (Subcutaneous Tissue): N/A N/A Yes Fascia: No Tendon: No Muscle: No Joint: No Bone: No Epithelialization: None N/A N/A Assessment Notes: incorrect 2 layer loose sagging wrap N/A N/A removed that he stated HH applied due to supplies not  delivered pre [facility] visit Treatment Notes Electronic Signature(s) Signed: 07/29/2020 5:13:28 PM By: Carlene Coria RN Entered By: Carlene Coria on 07/29/2020 10:51:57 Hodapp, [name] ([id number]) [name], [name] ([id number]) -------------------------------------------------------------------------------- Multi-Disciplinary Care Plan Details Patient Name: [name] Date of Service: 07/29/2020 10:30 AM Medical Record Number: [id number] Patient Account Number: [id number] Date of Birth/Sex: [birth year] (85 y.o. M) Treating RN: Carlene Coria Primary Care Edyth Glomb: Nobie Putnam Other Clinician: Jeanine Luz Referring Anterio Scheel: Nobie Putnam Treating Sumner Kirchman/Extender: Skipper Cliche in Treatment: 8 Active Inactive Wound/Skin Impairment Nursing Diagnoses: Knowledge deficit related to ulceration/compromised skin integrity Goals: Patient/caregiver will verbalize understanding of skin care regimen Date Initiated: 06/03/2020 Target Resolution Date: 08/01/2020 Goal Status: Active Ulcer/skin breakdown will have a volume reduction of 30% by week 4 Date Initiated: 06/03/2020 Date Inactivated: 07/01/2020 Target Resolution Date: 07/01/2020 Goal Status: Met Ulcer/skin breakdown will have a volume reduction of 50% by week 8 Date Initiated: 07/01/2020 Target Resolution Date: 08/01/2020 Goal Status: Active Ulcer/skin breakdown will have a volume reduction of 80% by week 12 Date Initiated: 07/01/2020 Target Resolution Date: 08/31/2020 Goal Status: Active Ulcer/skin breakdown will heal within 14 weeks Date Initiated: 07/01/2020 Target Resolution Date: 10/01/2020 Goal Status: Active Interventions: Assess patient/caregiver ability to obtain necessary supplies Assess patient/caregiver ability to perform ulcer/skin care regimen upon admission and as needed Assess ulceration(s) every visit Notes: Electronic Signature(s) Signed: 07/29/2020 5:13:28 PM By: Carlene Coria RN Entered By: Carlene Coria on 07/29/2020 10:50:41 Clausen, [name] ([id number]) -------------------------------------------------------------------------------- Pain Assessment Details Patient Name: [name] Date of Service: 07/29/2020 10:30 AM Medical Record Number: [id number] Patient Account Number: [id number] Date of Birth/Sex: Jan 02, 1935 (85 y.o. M) Treating RN: Donnamarie Poag Primary Care Oriel Rumbold: Nobie Putnam Other Clinician: Jeanine Luz Referring Matalyn Nawaz: Nobie Putnam Treating Dhiren Azimi/Extender: Skipper Cliche in Treatment: 8 Active Problems Location of Pain Severity and Description of Pain Patient Has Paino No Site Locations Rate the pain. Current Pain Level: 0 Pain Management and Medication Current Pain Management: Electronic Signature(s) Signed: 07/29/2020 4:36:12 PM By: Donnamarie Poag Entered By: Donnamarie Poag on 07/29/2020 10:28:59 Goldwasser, [name] ([id number]) -------------------------------------------------------------------------------- Patient/Caregiver Education Details Patient Name: [name] Date of Service: 07/29/2020 10:30 AM Medical Record Number: [id number] Patient Account Number: [id number] Date of Birth/Gender: 12-Sep-1934 (85 y.o. M) Treating RN: Carlene Coria Primary Care Physician: Nobie Putnam Other Clinician: Jeanine Luz Referring Physician: Nobie Putnam Treating Physician/Extender: Skipper Cliche in Treatment: 8 Education Assessment Education Provided To: Patient Education Topics Provided Wound/Skin Impairment: Methods: Explain/Verbal  Responses: State content correctly Electronic Signature(s) Signed: 07/29/2020 5:13:28 PM By: Carlene Coria RN Entered By: Carlene Coria on 07/29/2020 10:53:57 Luten, [name] ([id number]) -------------------------------------------------------------------------------- Wound Assessment Details Patient Name: [name] Date of Service: 07/29/2020 10:30 AM Medical Record Number:  [id number] Patient Account Number: [id number] Date of Birth/Sex: June 05, 1934 (85 y.o. M) Treating RN: Donnamarie Poag Primary Care Donathan Buller: Nobie Putnam Other Clinician: Jeanine Luz Referring Edger Husain: Nobie Putnam Treating Chasty Randal/Extender: Skipper Cliche in Treatment: 8 Wound Status Wound Number: 1 Primary Etiology: Pressure Ulcer Wound Location: Right, Lateral Foot Wound Status: Open Wounding Event: Pressure Injury Comorbid History: Cataracts, Hypertension Date Acquired: 04/03/2020 Weeks Of Treatment: 8 Clustered Wound: No Photos Wound Measurements Length: (cm) 4.8 Width: (cm) 3.2 Depth: (cm) 0.2 Area: (cm) 12.064 Volume: (cm) 2.413 % Reduction in Area: 46.2% % Reduction in Volume: 78.5% Epithelialization: None Tunneling: No Undermining: No Wound Description Classification: Unstageable/Unclassified Wound Margin: Thickened Exudate Amount: Medium Exudate Type: Serosanguineous Exudate Color: red, brown Foul Odor After Cleansing: No Slough/Fibrino Yes Wound Bed Granulation Amount: Large (67-100%) Exposed Structure Granulation Quality: Red Fascia Exposed: No Necrotic Amount: Small (1-33%) Fat Layer (Subcutaneous Tissue) Exposed: Yes Necrotic Quality: Adherent Slough Tendon Exposed: No Muscle Exposed: No Joint Exposed: No Bone Exposed: No Assessment Notes incorrect 2 layer loose sagging wrap removed that he stated HH applied due to supplies not delivered pre [facility] visit Treatment Notes Wound #1 (Foot) Wound Laterality: Right, Lateral Cleanser [place] Ancillary Kit - [address] DERREK, PUFF ([id number]) Discharge Instruction: Use supplies as instructed; Kit contains: (15) Saline Bullets; (15) 3x3 Gauze; 15 pr Gloves Normal Saline Discharge Instruction: Wash your hands with soap and water. Remove old dressing, discard into plastic bag and place into trash. Cleanse the wound with Normal Saline prior to applying a clean dressing using gauze  sponges, not tissues or cotton balls. Do not scrub or use excessive force. Pat dry using gauze sponges, not tissue or cotton balls. Peri-Wound Care Topical Primary Dressing Hydrofera Blue Ready Transfer Foam, 2.5x2.5 (in/in) Discharge Instruction: Apply Hydrofera Blue Ready to wound bed as directed Secondary Dressing ABD Pad 5x9 (in/in) Discharge Instruction: Cover with ABD pad Secured With Compression Wrap Profore Lite LF 3 Multilayer Compression [place] Discharge Instruction: Apply 3 multi-layer wrap as prescribed. Compression Stockings Add-Ons Electronic Signature(s) Signed: 07/29/2020 4:36:12 PM By: Donnamarie Poag Entered By: Donnamarie Poag on 07/29/2020 10:34:49 Ostenson, [name] ([id number]) -------------------------------------------------------------------------------- Vitals Details Patient Name: [name] Date of Service: 07/29/2020 10:30 AM Medical Record Number: [id number] Patient Account Number: [id number] Date of Birth/Sex: 09/02/1934 (85 y.o. M) Treating RN: Donnamarie Poag Primary Care Shamila Lerch: Nobie Putnam Other Clinician: Jeanine Luz Referring Yesmin Mutch: Nobie Putnam Treating Nethan Caudillo/Extender: Skipper Cliche in Treatment: 8 Vital Signs Time Taken: 10:27 Temperature (F): 97.9 Height (in): 71 Pulse (bpm): 79 Weight (lbs): 216 Respiratory Rate (breaths/min): 18 Body Mass Index (BMI): 30.1 Blood Pressure (mmHg): 153/85 Reference Range: 80 - 120 mg / dl Electronic Signature(s) Signed: 07/29/2020 4:36:12 PM By: Donnamarie Poag Entered ByDonnamarie Poag on 07/29/2020 10:28:47

## 2020-07-29 NOTE — Progress Notes (Addendum)
FREDDI, SCHRAGER ([id number]) Visit Report for 07/29/2020 Chief Complaint Document Details [name], [name] Date of Service: 07/29/2020 10:30 AM Medical Record Number: [id number] Patient Account Number: [id number] Date of Birth/Sex: 07/21/1934 (86 y.o. M) Treating RN: Carlene Coria Primary Care Provider: Nobie Putnam Other Clinician: Jeanine Luz Referring Provider: Nobie Putnam Treating Provider/Extender: Skipper Cliche in Treatment: 8 Information Obtained from: Patient Chief Complaint Right heel ulcer Electronic Signature(s) Signed: 07/29/2020 10:15:13 AM By: Worthy Keeler PA-C Entered By: Worthy Keeler on 07/29/2020 10:15:13 Savard, [name] ([id number]) -------------------------------------------------------------------------------- HPI Details [name] Date of Service: 07/29/2020 10:30 AM Medical Record Number: [id number] Patient Account Number: [id number] Date of Birth/Sex: October 05, 1934 (86 y.o. M) Treating RN: Carlene Coria Primary Care Provider: Nobie Putnam Other Clinician: Jeanine Luz Referring Provider: Nobie Putnam Treating Provider/Extender: Skipper Cliche in Treatment: 8 History of Present Illness HPI Description: 06/03/2020 upon evaluation today patient actually presents for initial inspection here in our clinic after having gone to urgent care this morning where they were concerned about infection as well as a significant "softball sized wound with eschar" over the heel. With that being said the patient fortunately does not have an area that largely does have an area of eschar which is draining around the edges really not stable and I think we are getting to remove the eschar so we try to clean up the surface of the wound. Fortunately there is no evidence of active infection at this time. I do believe that he is going require some debridement both manually today with scissors and forceps as well  as enzymatic/chemically with the medications. I really feel like that he has a good chance to get this to heal if he keep pressure off of it we did discuss that again today. He does note this has been present for about 2 months. He is not taking any of his current medications that we did put him on the list he tells me that he only occasionally takes them which is obviously not doing anything for him. He does have a cane but he does not use it and his ABI was 1.14 on the right. 06/10/2020 upon evaluation today patient appears to be doing well with regard to his wound all things considered. He still has a lot of necrotic tissue in the central portion of the wound. I think the Iodosorb is still the right thing to do and again the biggest issue is he quit using it as he and his wife felt like that the drainage following putting on the medicine was really "nasty". Nonetheless I explained that the weight Iodoflex breaks down or Iodosorb and that round does make it look really bad but it actually is not as bad as what it seems. In fact it is the normal course of things. 06/24/2020 upon evaluation today patient appears to be doing unfortunately about the same in regard to his heel. I do not think this is any worse. However its become increasingly clear that he has significant dementia in my opinion. We have had multiple instances going on here from the standpoint of trying to get him to take the oral antibiotic. Initially we ordered it for him he picked it up on 1 afternoon and by the next morning we called to make sure that he had gotten it he told us yes but that he had thrown it away. That he was not to take any additional medications. We then told him he needed  to be taken this to get the infection under control he subsequently look for it only found the bottle never took the medication therefore I sent it in a second time for him. After sending this and it looks like he has not really taken it since that  time he tells me that he took 7 pills at once although I do not think they were all of the same medication and then following this it made him sick I think these were all of his different medicines. He decided he was not to take any further medication after that point. Nonetheless I do believe he still is going require treatment for this infection the good news is he has a friend who is a very good friend that is with him here today to try to help with figuring out what to do and how going forward this should be taken care of. 07/01/2020 upon evaluation today patient appears to be doing really about the same. His measurements are maybe slightly smaller than previous. Fortunately there is no signs of active infection which is great news. No fevers, chills, nausea, vomiting, or diarrhea. 07/08/2020 upon evaluation today patient appears to be doing better in regard to his wounds. He looks like he finally actually took the Levaquin which has helped with the infection. I think things are doing much better in that regard which is great news. No fevers, chills, nausea, vomiting, or diarrhea. Unfortunately he did have actual Kleenex/tissue paper over the wound on really not sure why his wife put that on there but nonetheless I think were ready to start wrapping this week. 07/15/2020 on evaluation today patient appears to be doing about the same in regard to his wound. Unfortunately there does not appear to be any significant improvement. With that being said he also came in today with the heel of his wrap completely hanging out where the wound was exposed and just rubbing on the bedroom slipper that he has on today. With that being said this is obviously not ideal. I discussed that with the patient I really think that he is getting need to be more careful and conscientious about what is going on with his wrap. With that being said some of this is to know followed on his own that he actually has in my opinion  significant dementia which inhibits his ability to be able to comply. He has a friend with him today who is trying to help out as best he can I am just not certain that [name] is actually able to do what needs to be done here. 07/22/20 upon evaluation today patient appears to be doing well with regard to his wound is measuring a little bit smaller. With that being said he did not have any dressings on whatsoever upon evaluation today it was just gauze wrapped Curlex over his leg. Subsequently the Coban following this really was not the appropriate wrap and more importantly he had no dressing actually on the wound itself. Fortunately there does not appear to be any signs of active infection which is great news. Unfortunately my main concern here is that the patient needs to have appropriate dressings on in order to prevent infection and allow for continued and appropriate healing. 07/29/2020 upon evaluation today patient appears to be doing okay in regard to his wound. Unfortunately still do not have the Hydrofera Blue in place in fact it appears that he had a wet-to-dry dressing again applied to the heel even after our  conversation with the home health supervisor last week where they told us that that is what the nurse had put in place and that we have been contacted to advise [place] that that was being done. We had never been contacted in any such regard prior to that being done. At that point we advised that until they get the [facility] in they could use a silver alginate dressing. Nonetheless again today the patient has a wet-to-dry dressing only on he is very macerated around the edges he is also very hyper granulated in regard to the wound bed both a direct result of inappropriate dressings being applied to this wound. That is a direct cause of issues going on with home health not following orders as prescribed by the clinic here. Electronic Signature(s) Signed: 07/29/2020 11:28:37 AM By: Worthy Keeler PA-C Entered By: Worthy Keeler on 07/29/2020 11:28:36 Woehler, [name] ([id number]) -------------------------------------------------------------------------------- Physical Exam Details [name] Date of Service: 07/29/2020 10:30 AM Medical Record Number: [id number] Patient Account Number: [id number] Date of Birth/Sex: [birth year] (86 y.o. M) Treating RN: Carlene Coria Primary Care Provider: Nobie Putnam Other Clinician: Jeanine Luz Referring Provider: Nobie Putnam Treating Provider/Extender: Skipper Cliche in Treatment: 8 Constitutional Well-nourished and well-hydrated in no acute distress. Respiratory normal breathing without difficulty. Psychiatric this patient is able to make decisions and demonstrates good insight into disease process. Alert and Oriented x 3. pleasant and cooperative. Notes Upon inspection patient's wound bed actually showed signs again of maceration hypergranulation both a direct result of wet-to-dry dressings which are not appropriate to be done underneath the compression wrap. I am also unsure of what type of compression they are using as it is not a traditional or standard 3 layer compression wrap and again as best I can tell it appears to be possibly a [place] Unna boot wrap but again I am not 100% sure. Electronic Signature(s) Signed: 07/29/2020 11:29:09 AM By: Worthy Keeler PA-C Entered By: Worthy Keeler on 07/29/2020 11:29:08 [name] ([id number]) -------------------------------------------------------------------------------- Physician Orders Details [name] Date of Service: 07/29/2020 10:30 AM Medical Record Number: [id number] Patient Account Number: [id number] Date of Birth/Sex: 26-Dec-1934 (86 y.o. M) Treating RN: Carlene Coria Primary Care Provider: Nobie Putnam Other Clinician: Jeanine Luz Referring Provider: Nobie Putnam Treating  Provider/Extender: Skipper Cliche in Treatment: 8 Verbal / Phone Orders: No Diagnosis Coding ICD-10 Coding Code Description L89.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, unstageable R29.6 Repeated falls Z79.01 Long term (current) use of anticoagulants Follow-up Appointments o Return Appointment in 1 week. [facility] for wound care. May utilize formulary equivalent dressing for wound treatment orders unless otherwise specified. Home Health Nurse may visit PRN to address patientos wound care needs. - [facility] to change on Wednesdays and Fridays , patient to be seen in clinic on mondays Edema Control - Lymphedema / Segmental Compressive Device / Other o Elevate, Exercise Daily and Avoid Standing for Long Periods of Time. o Elevate legs to the level of the heart and pump ankles as often as possible o Elevate leg(s) parallel to the floor when sitting. Off-Loading o Open toe surgical shoe - right foot Wound Treatment Wound #1 - Foot Wound Laterality: Right, Lateral Cleanser: Byram Ancillary Kit - 15 Day Supply (Generic) 3 x Per Week/30 Days Discharge Instructions: Use supplies as instructed; Kit contains: (15) Saline Bullets; (15) 3x3 Gauze; 15 pr Gloves Cleanser: Normal Saline (Generic) 3 x Per Week/30 Days Discharge Instructions:  Wash your hands with soap and water. Remove old dressing, discard into plastic bag and place into trash. Cleanse the wound with Normal Saline prior to applying a clean dressing using gauze sponges, not tissues or cotton balls. Do not scrub or use excessive force. Pat dry using gauze sponges, not tissue or cotton balls. Primary Dressing: Hydrofera Blue Ready Transfer Foam, 2.5x2.5 (in/in) (Generic) 3 x Per Week/30 Days Discharge Instructions: Apply Hydrofera Blue Ready to wound bed as directed Secondary Dressing: ABD Pad 5x9 (in/in) (Generic) 3 x Per Week/30 Days Discharge Instructions: Cover with ABD pad Compression Wrap: Profore  Lite LF 3 Multilayer Compression Bandaging System (Generic) 3 x Per Week/30 Days Discharge Instructions: Apply 3 multi-layer wrap as prescribed. Electronic Signature(s) Signed: 07/29/2020 4:31:50 PM By: Lenda Kelp PA-C Signed: 07/29/2020 5:13:28 PM By: Yevonne Pax RN Entered By: Yevonne Pax on 07/29/2020 10:52:20 [place], Drue Flirt ([id number]) -------------------------------------------------------------------------------- Problem List Details [name] Date of Service: 07/29/2020 10:30 AM Medical Record Number: [id number] Patient Account Number: [id number] Date of Birth/Sex: 20-Dec-1934 (86 y.o. M) Treating RN: Yevonne Pax Primary Care Provider: Saralyn Pilar Other Clinician: Lolita Cram Referring Provider: Saralyn Pilar Treating Provider/Extender: Rowan Blase in Treatment: 8 Active Problems ICD-10 Encounter Code Description Active Date MDM Diagnosis L89.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, unstageable 06/03/2020 No Yes R29.6 Repeated falls 06/03/2020 No Yes Z79.01 Long term (current) use of anticoagulants 06/03/2020 No Yes Inactive Problems Resolved Problems Electronic Signature(s) Signed: 07/29/2020 10:15:07 AM By: Lenda Kelp PA-C Entered By: Lenda Kelp on 07/29/2020 10:15:07 Dente, Drue Flirt ([id number]) -------------------------------------------------------------------------------- Progress Note Details [name] Date of Service: 07/29/2020 10:30 AM Medical Record Number: [id number] Patient Account Number: [id number] Date of Birth/Sex: 1934/08/18 (86 y.o. M) Treating RN: Yevonne Pax Primary Care Provider: Saralyn Pilar Other Clinician: Lolita Cram Referring Provider: Saralyn Pilar Treating Provider/Extender: Rowan Blase in Treatment: 8 Subjective Chief Complaint Information obtained from Patient Right heel ulcer History of Present Illness (HPI) 06/03/2020 upon evaluation  today patient actually presents for initial inspection here in our clinic after having gone to urgent care this morning where they were concerned about infection as well as a significant "softball sized wound with eschar" over the heel. With that being said the patient fortunately does not have an area that largely does have an area of eschar which is draining around the edges really not stable and I think we are getting to remove the eschar so we try to clean up the surface of the wound. Fortunately there is no evidence of active infection at this time. I do believe that he is going require some debridement both manually today with scissors and forceps as well as enzymatic/chemically with the medications. I really feel like that he has a good chance to get this to heal if he keep pressure off of it we did discuss that again today. He does note this has been present for about 2 months. He is not taking any of his current medications that we did put him on the list he tells me that he only occasionally takes them which is obviously not doing anything for him. He does have a cane but he does not use it and his ABI was 1.14 on the right. 06/10/2020 upon evaluation today patient appears to be doing well with regard to his wound all things considered. He still has a lot of necrotic tissue in the central portion of the wound. I think the Iodosorb is still  the right thing to do and again the biggest issue is he quit using it as he and his wife felt like that the drainage following putting on the medicine was really "nasty". Nonetheless I explained that the weight Iodoflex breaks down or Iodosorb and that round does make it look really bad but it actually is not as bad as what it seems. In fact it is the normal course of things. 06/24/2020 upon evaluation today patient appears to be doing unfortunately about the same in regard to his heel. I do not think this is any worse. However its become increasingly clear  that he has significant dementia in my opinion. We have had multiple instances going on here from the standpoint of trying to get him to take the oral antibiotic. Initially we ordered it for him he picked it up on 1 afternoon and by the next morning we called to make sure that he had gotten it he told us yes but that he had thrown it away. That he was not to take any additional medications. We then told him he needed to be taken this to get the infection under control he subsequently look for it only found the bottle never took the medication therefore I sent it in a second time for him. After sending this and it looks like he has not really taken it since that time he tells me that he took 7 pills at once although I do not think they were all of the same medication and then following this it made him sick I think these were all of his different medicines. He decided he was not to take any further medication after that point. Nonetheless I do believe he still is going require treatment for this infection the good news is he has a friend who is a very good friend that is with him here today to try to help with figuring out what to do and how going forward this should be taken care of. 07/01/2020 upon evaluation today patient appears to be doing really about the same. His measurements are maybe slightly smaller than previous. Fortunately there is no signs of active infection which is great news. No fevers, chills, nausea, vomiting, or diarrhea. 07/08/2020 upon evaluation today patient appears to be doing better in regard to his wounds. He looks like he finally actually took the Levaquin which has helped with the infection. I think things are doing much better in that regard which is great news. No fevers, chills, nausea, vomiting, or diarrhea. Unfortunately he did have actual Kleenex/tissue paper over the wound on really not sure why his wife put that on there but nonetheless I think were ready to start  wrapping this week. 07/15/2020 on evaluation today patient appears to be doing about the same in regard to his wound. Unfortunately there does not appear to be any significant improvement. With that being said he also came in today with the heel of his wrap completely hanging out where the wound was exposed and just rubbing on the bedroom slipper that he has on today. With that being said this is obviously not ideal. I discussed that with the patient I really think that he is getting need to be more careful and conscientious about what is going on with his wrap. With that being said some of this is to know followed on his own that he actually has in my opinion significant dementia which inhibits his ability to be able to comply. He has a  friend with him today who is trying to help out as best he can I am just not certain that [name] is actually able to do what needs to be done here. 07/22/20 upon evaluation today patient appears to be doing well with regard to his wound is measuring a little bit smaller. With that being said he did not have any dressings on whatsoever upon evaluation today it was just gauze wrapped Curlex over his leg. Subsequently the Coban following this really was not the appropriate wrap and more importantly he had no dressing actually on the wound itself. Fortunately there does not appear to be any signs of active infection which is great news. Unfortunately my main concern here is that the patient needs to have appropriate dressings on in order to prevent infection and allow for continued and appropriate healing. 07/29/2020 upon evaluation today patient appears to be doing okay in regard to his wound. Unfortunately still do not have the Hydrofera Blue in place in fact it appears that he had a wet-to-dry dressing again applied to the heel even after our conversation with the home health supervisor last week where they told us that that is what the nurse had put in place and that we  have been contacted to advise [place] that that was being done. We had never been contacted in any such regard prior to that being done. At that point we advised that until they get the [facility] in they could use a silver alginate dressing. Nonetheless again today the patient has a wet-to-dry dressing only on he is very macerated around the edges he is also very hyper granulated in regard to the wound bed both a direct result of inappropriate dressings being applied to this wound. That is a direct cause of issues going on with home health not following orders as prescribed by the clinic here. [name], [name] ([id number]) Objective Constitutional Well-nourished and well-hydrated in no acute distress. Vitals Time Taken: 10:27 AM, Height: 71 in, Weight: 216 lbs, BMI: 30.1, Temperature: 97.9 F, Pulse: 79 bpm, Respiratory Rate: 18 breaths/min, Blood Pressure: 153/85 mmHg. Respiratory normal breathing without difficulty. Psychiatric this patient is able to make decisions and demonstrates good insight into disease process. Alert and Oriented x 3. pleasant and cooperative. General Notes: Upon inspection patient's wound bed actually showed signs again of maceration hypergranulation both a direct result of wet-to-dry dressings which are not appropriate to be done underneath the compression wrap. I am also unsure of what type of compression they are using as it is not a traditional or standard 3 layer compression wrap and again as best I can tell it appears to be possibly a [place] boot wrap but again I am not 100% sure. Integumentary (Hair, Skin) Wound #1 status is Open. Original cause of wound was Pressure Injury. The date acquired was: 04/03/2020. The wound has been in treatment 8 weeks. The wound is located on the Right,Lateral Foot. The wound measures 4.8cm length x 3.2cm width x 0.2cm depth; 12.064cm^2 area and 2.413cm^3 volume. There is Fat Layer (Subcutaneous Tissue) exposed. There is no  tunneling or undermining noted. There is a medium amount of serosanguineous drainage noted. The wound margin is thickened. There is large (67-100%) red granulation within the wound bed. There is a small (1-33%) amount of necrotic tissue within the wound bed including Adherent Slough. General Notes: incorrect 2 layer loose sagging wrap removed that he stated HH applied due to supplies not delivered pre [facility] visit Assessment Active  Problems ICD-10 Pressure ulcer of right heel, unstageable Repeated falls Long term (current) use of anticoagulants Procedures Wound #1 Pre-procedure diagnosis of Wound #1 is a Pressure Ulcer located on the Right,Lateral Foot . There was a Three Layer Compression Therapy Procedure by Carlene Coria, RN. Post procedure Diagnosis Wound #1: Same as Pre-Procedure Plan Follow-up Appointments: Return Appointment in 1 week. Home Health: [facility] for wound care. May utilize formulary equivalent dressing for wound treatment orders unless otherwise specified. Home Health Nurse may visit PRN to address patient s wound care needs. - [facility] to change on Wednesdays and Fridays , patient to be seen in clinic on mondays Edema Control - Lymphedema / Segmental Compressive Device / Other: Elevate, Exercise Daily and Avoid Standing for Long Periods of Time. [name], [name] ([id number]) Elevate legs to the level of the heart and pump ankles as often as possible Elevate leg(s) parallel to the floor when sitting. Off-Loading: Open toe surgical shoe - right foot WOUND #1: - Foot Wound Laterality: Right, Lateral Cleanser: Byram Ancillary Kit - 15 Day Supply (Generic) 3 x Per Week/30 Days Discharge Instructions: Use supplies as instructed; Kit contains: (15) Saline Bullets; (15) 3x3 Gauze; 15 pr Gloves Cleanser: Normal Saline (Generic) 3 x Per Week/30 Days Discharge Instructions: Wash your hands with soap and water. Remove old dressing, discard into plastic bag and place  into trash. Cleanse the wound with Normal Saline prior to applying a clean dressing using gauze sponges, not tissues or cotton balls. Do not scrub or use excessive force. Pat dry using gauze sponges, not tissue or cotton balls. Primary Dressing: Hydrofera Blue Ready Transfer Foam, 2.5x2.5 (in/in) (Generic) 3 x Per Week/30 Days Discharge Instructions: Apply Hydrofera Blue Ready to wound bed as directed Secondary Dressing: ABD Pad 5x9 (in/in) (Generic) 3 x Per Week/30 Days Discharge Instructions: Cover with ABD pad Compression Wrap: Profore Lite LF 3 Multilayer Compression Bandaging System (Generic) 3 x Per Week/30 Days Discharge Instructions: Apply 3 multi-layer wrap as prescribed. 1. Would recommend currently that we going continue with the 3 layer compression wrap which I think is can be appropriate. 2. I am also can recommend we continue with the [facility]. I do not want to have this patient with a wet-to-dry dressing on going forward yet again when I see him back next week. Hopefully the supplies that he received after the nurse left at the last visit will include the [facility] which she can start appropriately dressing this. We are also sending home with him extra supplies which again should mean that he has what he needs in order to be able to have home health redress this. Either way I think that the patient should have the appropriate dressing on come next week. We will see patient back for reevaluation in 1 week here in the clinic. If anything worsens or changes patient will contact our office for additional recommendations. Electronic Signature(s) Signed: 07/29/2020 11:29:58 AM By: Worthy Keeler PA-C Entered By: Worthy Keeler on 07/29/2020 11:29:57 [name], [name] ([id number]) -------------------------------------------------------------------------------- SuperBill Details [name] Date of Service: 07/29/2020 Medical Record Number: [id number] Patient  Account Number: [id number] Date of Birth/Sex: 11-19-1934 (86 y.o. M) Treating RN: Carlene Coria Primary Care Provider: Nobie Putnam Other Clinician: Jeanine Luz Referring Provider: Nobie Putnam Treating Provider/Extender: Skipper Cliche in Treatment: 8 Diagnosis Coding ICD-10 Codes Code Description L89.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, unstageable R29.6 Repeated falls Z79.01 Long term (current) use of anticoagulants Facility Procedures CPT4  Code: [id number] Description: 61443 - WOUND CARE VISIT-LEV 3 EST PT Modifier: Quantity: 1 Physician Procedures CPT4 Code: [id number] Description: 76195 - WC PHYS LEVEL 3 - EST PT Modifier: Quantity: 1 CPT4 Code: Description: ICD-10 Diagnosis Description L89.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, unstageable R29.6 Repeated falls Z79.01 Long term (current) use of anticoagulants Modifier: Quantity: Electronic Signature(s) Signed: 07/29/2020 11:30:10 AM By: Worthy Keeler PA-C Entered By: Worthy Keeler on 07/29/2020 11:30:09

## 2020-08-01 DIAGNOSIS — L89612 Pressure ulcer of right heel, stage 2: Secondary | ICD-10-CM | POA: Diagnosis not present

## 2020-08-01 DIAGNOSIS — E785 Hyperlipidemia, unspecified: Secondary | ICD-10-CM | POA: Diagnosis not present

## 2020-08-01 DIAGNOSIS — R300 Dysuria: Secondary | ICD-10-CM | POA: Diagnosis not present

## 2020-08-01 DIAGNOSIS — I129 Hypertensive chronic kidney disease with stage 1 through stage 4 chronic kidney disease, or unspecified chronic kidney disease: Secondary | ICD-10-CM | POA: Diagnosis not present

## 2020-08-01 DIAGNOSIS — I69315 Cognitive social or emotional deficit following cerebral infarction: Secondary | ICD-10-CM | POA: Diagnosis not present

## 2020-08-01 DIAGNOSIS — N183 Chronic kidney disease, stage 3 unspecified: Secondary | ICD-10-CM | POA: Diagnosis not present

## 2020-08-02 DIAGNOSIS — L89612 Pressure ulcer of right heel, stage 2: Secondary | ICD-10-CM | POA: Diagnosis not present

## 2020-08-03 DIAGNOSIS — R300 Dysuria: Secondary | ICD-10-CM | POA: Diagnosis not present

## 2020-08-03 DIAGNOSIS — L89612 Pressure ulcer of right heel, stage 2: Secondary | ICD-10-CM | POA: Diagnosis not present

## 2020-08-03 DIAGNOSIS — I69315 Cognitive social or emotional deficit following cerebral infarction: Secondary | ICD-10-CM | POA: Diagnosis not present

## 2020-08-03 DIAGNOSIS — N183 Chronic kidney disease, stage 3 unspecified: Secondary | ICD-10-CM | POA: Diagnosis not present

## 2020-08-03 DIAGNOSIS — I129 Hypertensive chronic kidney disease with stage 1 through stage 4 chronic kidney disease, or unspecified chronic kidney disease: Secondary | ICD-10-CM | POA: Diagnosis not present

## 2020-08-03 DIAGNOSIS — E785 Hyperlipidemia, unspecified: Secondary | ICD-10-CM | POA: Diagnosis not present

## 2020-08-05 ENCOUNTER — Encounter: Payer: Medicare Other | Admitting: Physician Assistant

## 2020-08-05 ENCOUNTER — Other Ambulatory Visit: Payer: Self-pay

## 2020-08-05 DIAGNOSIS — L89890 Pressure ulcer of other site, unstageable: Secondary | ICD-10-CM | POA: Diagnosis not present

## 2020-08-05 DIAGNOSIS — L89610 Pressure ulcer of right heel, unstageable: Secondary | ICD-10-CM | POA: Diagnosis not present

## 2020-08-05 DIAGNOSIS — I10 Essential (primary) hypertension: Secondary | ICD-10-CM | POA: Diagnosis not present

## 2020-08-05 NOTE — Progress Notes (Signed)
08/06/2020 11:42 AM   [name] 06/10/1934 [id number]  Referring provider: [name], DO [address] [place],  [place] [address]  Chief Complaint  Patient presents with  . Follow-up   Urological history: 1. Elevated PSA - PSA Trend PSA, Total 0 - 4 ng/mL 2.34   2012   Component     Latest Ref Rng & Units 07/20/2016  PSA, Total     <=4.0 ng/mL 11.8 (H)   Component     Latest Ref Rng & Units 10/01/2016 04/09/2017 10/04/2017 11/08/2018  Prostate Specific Ag, Serum     0.0 - 4.0 ng/mL 10.6 (H) 11.1 (H) 17.9 (H) 17.4 (H)   Component     Latest Ref Rng & Units 06/13/2020  PSA     < OR = 4.0 ng/mL 24.34 (H)   Advised to undergo a prostate MRI in 10/2018 but patient refused  2. BPH with LU TS - I PSS 21/5 - PVR 0 mL - managed with terazosin 4 mg daily   HPI: [name] is a 86 y.o. male who presents today to get his prostate MRI scheduled with his friend from church, [name]    PMH: Past Medical History:  Diagnosis Date  . Bowel obstruction ([place])   . BPH (benign prostatic hypertrophy)   . Gastric outlet obstruction   . High cholesterol   . Hyperchloremia   . Hypertension   . Pancreatitis     Surgical History: Past Surgical History:  Procedure Laterality Date  . ESOPHAGOGASTRODUODENOSCOPY N/A 12/03/2014   Procedure: ESOPHAGOGASTRODUODENOSCOPY (EGD);  Surgeon: Hulen Luster, MD;  Location: [facility] ENDOSCOPY;  Service: Endoscopy;  Laterality: N/A;    Home Medications:  Allergies as of 08/06/2020   No Known Allergies     Medication List       Accurate as of August 06, 2020 11:42 AM. If you have any questions, ask your nurse or doctor.        aspirin EC 81 MG tablet Take 1 tablet (81 mg total) by mouth in the morning. (blood thinner)   atorvastatin 40 MG tablet Commonly known as: LIPITOR Take 1 tablet (40 mg total) by mouth at bedtime. (Cholesterol)   clopidogrel 75 MG tablet Commonly known as: PLAVIX Take 1 tablet (75 mg total) by mouth in  the morning. (blood thinner)   lisinopril 20 MG tablet Commonly known as: ZESTRIL Take 1 tablet (20 mg total) by mouth in the morning. (Blood pressure)   terazosin 5 MG capsule Commonly known as: HYTRIN Take 1 capsule (5 mg total) by mouth at bedtime. (blood pressure, prostate)       Allergies: No Known Allergies  Family History: Family History  Problem Relation Age of Onset  . Heart disease Mother   . Heart disease Father   . Heart disease Brother     Social History:  reports that he quit smoking about 37 years ago. He has quit using smokeless tobacco. He reports that he does not drink alcohol and does not use drugs.  For pertinent review of systems please refer to history of present illness  Physical Exam: BP (!) 162/76   Pulse 68   Ht 5\' 10"  (1.778 m)   Wt 217 lb 6.4 oz (98.6 kg)   BMI 31.19 kg/m   Constitutional:  Well nourished. Alert and oriented, No acute distress. HEENT: [place] AT, mask in place.  Trachea midline Cardiovascular: No clubbing, cyanosis, or edema. Respiratory: Normal respiratory effort, no increased work  of breathing. Neurologic: Grossly intact, no focal deficits, moving all 4 extremities. Psychiatric: Normal mood and affect.   Laboratory Data: No new labs since last visit   Pertinent Imaging No new imaging   Assessment & Plan:    1.  Elevated PSA/Abnormal exam -prostate mri scheduled   Return for return for Prostate MRI results .     [name], PA-C  [facility] [address] [place], [place] [address] [phone]  I spent 10 minutes on the day of the encounter to include pre-visit record review, face-to-face time with the patient, and post-visit ordering of tests.

## 2020-08-05 NOTE — Progress Notes (Addendum)
[name], [name] ([id number]) Visit Report for 08/05/2020 Chief Complaint Document Details [name], [name] Date of Service: 08/05/2020 10:00 AM Medical Record Number: [id number] Patient Account Number: [id number] Date of Birth/Sex: 07-01-1934 (86 y.o. M) Treating RN: Carlene Coria Primary Care Provider: Nobie Putnam Other Clinician: Jeanine Luz Referring Provider: Nobie Putnam Treating Provider/Extender: Skipper Cliche in Treatment: 9 Information Obtained from: Patient Chief Complaint Right heel ulcer Electronic Signature(s) Signed: 08/05/2020 10:18:16 AM By: Worthy Keeler PA-C Entered By: Worthy Keeler on 08/05/2020 10:18:16 [name], [name] ([id number]) -------------------------------------------------------------------------------- HPI Details [name] Date of Service: 08/05/2020 10:00 AM Medical Record Number: [id number] Patient Account Number: [id number] Date of Birth/Sex: 1934-05-02 (86 y.o. M) Treating RN: Carlene Coria Primary Care Provider: Nobie Putnam Other Clinician: Jeanine Luz Referring Provider: Nobie Putnam Treating Provider/Extender: Skipper Cliche in Treatment: 9 History of Present Illness HPI Description: 06/03/2020 upon evaluation today patient actually presents for initial inspection here in our clinic after having gone to urgent care this morning where they were concerned about infection as well as a significant "softball sized wound with eschar" over the heel. With that being said the patient fortunately does not have an area that largely does have an area of eschar which is draining around the edges really not stable and I think we are getting to remove the eschar so we try to clean up the surface of the wound. Fortunately there is no evidence of active infection at this time. I do believe that he is going require some debridement both manually today with scissors and forceps as well  as enzymatic/chemically with the medications. I really feel like that he has a good chance to get this to heal if he keep pressure off of it we did discuss that again today. He does note this has been present for about 2 months. He is not taking any of his current medications that we did put him on the list he tells me that he only occasionally takes them which is obviously not doing anything for him. He does have a cane but he does not use it and his ABI was 1.14 on the right. 06/10/2020 upon evaluation today patient appears to be doing well with regard to his wound all things considered. He still has a lot of necrotic tissue in the central portion of the wound. I think the Iodosorb is still the right thing to do and again the biggest issue is he quit using it as he and his wife felt like that the drainage following putting on the medicine was really "nasty". Nonetheless I explained that the weight Iodoflex breaks down or Iodosorb and that round does make it look really bad but it actually is not as bad as what it seems. In fact it is the normal course of things. 06/24/2020 upon evaluation today patient appears to be doing unfortunately about the same in regard to his heel. I do not think this is any worse. However its become increasingly clear that he has significant dementia in my opinion. We have had multiple instances going on here from the standpoint of trying to get him to take the oral antibiotic. Initially we ordered it for him he picked it up on 1 afternoon and by the next morning we called to make sure that he had gotten it he told us yes but that he had thrown it away. That he was not to take any additional medications. We then told him he needed  to be taken this to get the infection under control he subsequently look for it only found the bottle never took the medication therefore I sent it in a second time for him. After sending this and it looks like he has not really taken it since that  time he tells me that he took 7 pills at once although I do not think they were all of the same medication and then following this it made him sick I think these were all of his different medicines. He decided he was not to take any further medication after that point. Nonetheless I do believe he still is going require treatment for this infection the good news is he has a friend who is a very good friend that is with him here today to try to help with figuring out what to do and how going forward this should be taken care of. 07/01/2020 upon evaluation today patient appears to be doing really about the same. His measurements are maybe slightly smaller than previous. Fortunately there is no signs of active infection which is great news. No fevers, chills, nausea, vomiting, or diarrhea. 07/08/2020 upon evaluation today patient appears to be doing better in regard to his wounds. He looks like he finally actually took the Levaquin which has helped with the infection. I think things are doing much better in that regard which is great news. No fevers, chills, nausea, vomiting, or diarrhea. Unfortunately he did have actual Kleenex/tissue paper over the wound on really not sure why his wife put that on there but nonetheless I think were ready to start wrapping this week. 07/15/2020 on evaluation today patient appears to be doing about the same in regard to his wound. Unfortunately there does not appear to be any significant improvement. With that being said he also came in today with the heel of his wrap completely hanging out where the wound was exposed and just rubbing on the bedroom slipper that he has on today. With that being said this is obviously not ideal. I discussed that with the patient I really think that he is getting need to be more careful and conscientious about what is going on with his wrap. With that being said some of this is to know followed on his own that he actually has in my opinion  significant dementia which inhibits his ability to be able to comply. He has a friend with him today who is trying to help out as best he can I am just not certain that Mr. Platz is actually able to do what needs to be done here. 07/22/20 upon evaluation today patient appears to be doing well with regard to his wound is measuring a little bit smaller. With that being said he did not have any dressings on whatsoever upon evaluation today it was just gauze wrapped Curlex over his leg. Subsequently the Coban following this really was not the appropriate wrap and more importantly he had no dressing actually on the wound itself. Fortunately there does not appear to be any signs of active infection which is great news. Unfortunately my main concern here is that the patient needs to have appropriate dressings on in order to prevent infection and allow for continued and appropriate healing. 07/29/2020 upon evaluation today patient appears to be doing okay in regard to his wound. Unfortunately still do not have the Hydrofera Blue in place in fact it appears that he had a wet-to-dry dressing again applied to the heel even after our  conversation with the home health supervisor last week where they told us that that is what the nurse had put in place and that we have been contacted to advise [place] that that was being done. We had never been contacted in any such regard prior to that being done. At that point we advised that until they get the [facility] in they could use a silver alginate dressing. Nonetheless again today the patient has a wet-to-dry dressing only on he is very macerated around the edges he is also very hyper granulated in regard to the wound bed both a direct result of inappropriate dressings being applied to this wound. That is a direct cause of issues going on with home health not following orders as prescribed by the clinic here. 08/05/2020 upon evaluation today patient appears to be doing  excellent in regard to his wound. He has been tolerating the dressing changes without complication and overall very pleased with where things stand currently. With that being said the patient did not have the ABD pad in place and I really think he may actually be able to do better with XtraSorb even at this time. Nonetheless I think that if we can get this swelling under control and then turned the edema lessens is weeping then that will be a dramatic improvement. We can work on that currently. Electronic Signature(s) [name], [name] ([id number]) Signed: 08/05/2020 1:52:05 PM By: Worthy Keeler PA-C Entered By: Worthy Keeler on 08/05/2020 13:52:05 [name], [name] ([id number]) -------------------------------------------------------------------------------- Physical Exam Details [name] Date of Service: 08/05/2020 10:00 AM Medical Record Number: [id number] Patient Account Number: [id number] Date of Birth/Sex: 12-Mar-1935 (86 y.o. M) Treating RN: Carlene Coria Primary Care Provider: Nobie Putnam Other Clinician: Jeanine Luz Referring Provider: Nobie Putnam Treating Provider/Extender: Skipper Cliche in Treatment: 9 Constitutional Well-nourished and well-hydrated in no acute distress. Respiratory normal breathing without difficulty. Psychiatric this patient is able to make decisions and demonstrates good insight into disease process. Alert and Oriented x 3. pleasant and cooperative. Notes Upon inspection patient's wound bed actually showed some signs of hypergranulation but this was minimal I feel like he is actually making pretty good progress here. Fortunately there does not appear to be any signs of active infection at this point. Electronic Signature(s) Signed: 08/05/2020 1:52:48 PM By: Worthy Keeler PA-C Entered By: Worthy Keeler on 08/05/2020 13:52:48 [name], [name]  ([id number]) -------------------------------------------------------------------------------- Physician Orders Details [name] Date of Service: 08/05/2020 10:00 AM Medical Record Number: [id number] Patient Account Number: [id number] Date of Birth/Sex: May 14, 1934 (86 y.o. M) Treating RN: Carlene Coria Primary Care Provider: Nobie Putnam Other Clinician: Jeanine Luz Referring Provider: Nobie Putnam Treating Provider/Extender: Skipper Cliche in Treatment: 9 Verbal / Phone Orders: No Diagnosis Coding ICD-10 Coding Code Description L89.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, unstageable R29.6 Repeated falls Z79.01 Long term (current) use of anticoagulants Follow-up Appointments o Return Appointment in 1 week. [place] for wound care. May utilize formulary equivalent dressing for wound treatment orders unless otherwise specified. Home Health Nurse may visit PRN to address patientos wound care needs. - [facility] to change on Wednesdays and Fridays , patient to be seen in clinic on mondays Edema Control - Lymphedema / Segmental Compressive Device / Other o Elevate, Exercise Daily and Avoid Standing for Long Periods of Time. o Elevate legs to the level of the heart and pump ankles as often as possible o Elevate leg(s) parallel to  the floor when sitting. Off-Loading o Open toe surgical shoe - right foot Wound Treatment Wound #1 - Foot Wound Laterality: Right, Lateral Cleanser: Byram Ancillary Kit - 15 Day Supply (Generic) 3 x Per Week/30 Days Discharge Instructions: Use supplies as instructed; Kit contains: (15) Saline Bullets; (15) 3x3 Gauze; 15 pr Gloves Cleanser: Normal Saline (Generic) 3 x Per Week/30 Days Discharge Instructions: Wash your hands with soap and water. Remove old dressing, discard into plastic bag and place into trash. Cleanse the wound with Normal Saline prior to applying a clean dressing using  gauze sponges, not tissues or cotton balls. Do not scrub or use excessive force. Pat dry using gauze sponges, not tissue or cotton balls. Primary Dressing: Hydrofera Blue Ready Transfer Foam, 2.5x2.5 (in/in) (Generic) 3 x Per Week/30 Days Discharge Instructions: Apply Hydrofera Blue Ready to wound bed as directed Secondary Dressing: Xtrasorb Medium 4x5 (in/in) (Generic) 3 x Per Week/30 Days Discharge Instructions: Apply to wound as directed. Do not cut. Compression Wrap: Profore Lite LF 3 Multilayer Compression Bandaging System (Generic) 3 x Per Week/30 Days Discharge Instructions: Apply 3 multi-layer wrap as prescribed. Electronic Signature(s) Signed: 08/05/2020 5:07:44 PM By: Worthy Keeler PA-C Signed: 08/06/2020 7:54:04 AM By: Carlene Coria RN Entered By: Carlene Coria on 08/05/2020 10:25:03 [name], [name] ([id number]) -------------------------------------------------------------------------------- Problem List Details [name] Date of Service: 08/05/2020 10:00 AM Medical Record Number: [id number] Patient Account Number: [id number] Date of Birth/Sex: 24-Apr-1934 (86 y.o. M) Treating RN: Carlene Coria Primary Care Provider: Nobie Putnam Other Clinician: Jeanine Luz Referring Provider: Nobie Putnam Treating Provider/Extender: Skipper Cliche in Treatment: 9 Active Problems ICD-10 Encounter Code Description Active Date MDM Diagnosis L89.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, unstageable 06/03/2020 No Yes R29.6 Repeated falls 06/03/2020 No Yes Z79.01 Long term (current) use of anticoagulants 06/03/2020 No Yes Inactive Problems Resolved Problems Electronic Signature(s) Signed: 08/05/2020 10:18:11 AM By: Worthy Keeler PA-C Entered By: Worthy Keeler on 08/05/2020 10:18:10 [name], [name] ([id number]) -------------------------------------------------------------------------------- Progress Note Details [name] Date of Service:  08/05/2020 10:00 AM Medical Record Number: [id number] Patient Account Number: [id number] Date of Birth/Sex: [birth year] (86 y.o. M) Treating RN: Carlene Coria Primary Care Provider: Nobie Putnam Other Clinician: Jeanine Luz Referring Provider: Nobie Putnam Treating Provider/Extender: Skipper Cliche in Treatment: 9 Subjective Chief Complaint Information obtained from Patient Right heel ulcer History of Present Illness (HPI) 06/03/2020 upon evaluation today patient actually presents for initial inspection here in our clinic after having gone to urgent care this morning where they were concerned about infection as well as a significant "softball sized wound with eschar" over the heel. With that being said the patient fortunately does not have an area that largely does have an area of eschar which is draining around the edges really not stable and I think we are getting to remove the eschar so we try to clean up the surface of the wound. Fortunately there is no evidence of active infection at this time. I do believe that he is going require some debridement both manually today with scissors and forceps as well as enzymatic/chemically with the medications. I really feel like that he has a good chance to get this to heal if he keep pressure off of it we did discuss that again today. He does note this has been present for about 2 months. He is not taking any of his current medications that we did put him on the list he tells me that he only  occasionally takes them which is obviously not doing anything for him. He does have a cane but he does not use it and his ABI was 1.14 on the right. 06/10/2020 upon evaluation today patient appears to be doing well with regard to his wound all things considered. He still has a lot of necrotic tissue in the central portion of the wound. I think the Iodosorb is still the right thing to do and again the biggest issue is he quit using it as he  and his wife felt like that the drainage following putting on the medicine was really "nasty". Nonetheless I explained that the weight Iodoflex breaks down or Iodosorb and that round does make it look really bad but it actually is not as bad as what it seems. In fact it is the normal course of things. 06/24/2020 upon evaluation today patient appears to be doing unfortunately about the same in regard to his heel. I do not think this is any worse. However its become increasingly clear that he has significant dementia in my opinion. We have had multiple instances going on here from the standpoint of trying to get him to take the oral antibiotic. Initially we ordered it for him he picked it up on 1 afternoon and by the next morning we called to make sure that he had gotten it he told us yes but that he had thrown it away. That he was not to take any additional medications. We then told him he needed to be taken this to get the infection under control he subsequently look for it only found the bottle never took the medication therefore I sent it in a second time for him. After sending this and it looks like he has not really taken it since that time he tells me that he took 7 pills at once although I do not think they were all of the same medication and then following this it made him sick I think these were all of his different medicines. He decided he was not to take any further medication after that point. Nonetheless I do believe he still is going require treatment for this infection the good news is he has a friend who is a very good friend that is with him here today to try to help with figuring out what to do and how going forward this should be taken care of. 07/01/2020 upon evaluation today patient appears to be doing really about the same. His measurements are maybe slightly smaller than previous. Fortunately there is no signs of active infection which is great news. No fevers, chills, nausea,  vomiting, or diarrhea. 07/08/2020 upon evaluation today patient appears to be doing better in regard to his wounds. He looks like he finally actually took the Levaquin which has helped with the infection. I think things are doing much better in that regard which is great news. No fevers, chills, nausea, vomiting, or diarrhea. Unfortunately he did have actual Kleenex/tissue paper over the wound on really not sure why his wife put that on there but nonetheless I think were ready to start wrapping this week. 07/15/2020 on evaluation today patient appears to be doing about the same in regard to his wound. Unfortunately there does not appear to be any significant improvement. With that being said he also came in today with the heel of his wrap completely hanging out where the wound was exposed and just rubbing on the bedroom slipper that he has on today. With that being  said this is obviously not ideal. I discussed that with the patient I really think that he is getting need to be more careful and conscientious about what is going on with his wrap. With that being said some of this is to know followed on his own that he actually has in my opinion significant dementia which inhibits his ability to be able to comply. He has a friend with him today who is trying to help out as best he can I am just not certain that Mr. Spruce is actually able to do what needs to be done here. 07/22/20 upon evaluation today patient appears to be doing well with regard to his wound is measuring a little bit smaller. With that being said he did not have any dressings on whatsoever upon evaluation today it was just gauze wrapped Curlex over his leg. Subsequently the Coban following this really was not the appropriate wrap and more importantly he had no dressing actually on the wound itself. Fortunately there does not appear to be any signs of active infection which is great news. Unfortunately my main concern here is that the patient  needs to have appropriate dressings on in order to prevent infection and allow for continued and appropriate healing. 07/29/2020 upon evaluation today patient appears to be doing okay in regard to his wound. Unfortunately still do not have the Hydrofera Blue in place in fact it appears that he had a wet-to-dry dressing again applied to the heel even after our conversation with the home health supervisor last week where they told us that that is what the nurse had put in place and that we have been contacted to advise [place] that that was being done. We had never been contacted in any such regard prior to that being done. At that point we advised that until they get the [facility] in they could use a silver alginate dressing. Nonetheless again today the patient has a wet-to-dry dressing only on he is very macerated around the edges he is also very hyper granulated in regard to the wound bed both a direct result of inappropriate dressings being applied to this wound. That is a direct cause of issues going on with home health not following orders as prescribed by the clinic here. 08/05/2020 upon evaluation today patient appears to be doing excellent in regard to his wound. He has been tolerating the dressing changes without complication and overall very pleased with where things stand currently. With that being said the patient did not have the ABD pad in place and I really think he may actually be able to do better with XtraSorb even at this time. Nonetheless I think that if we can get this swelling [name], Jonathan D. ([id number]) under control and then turned the edema lessens is weeping then that will be a dramatic improvement. We can work on that currently. Objective Constitutional Well-nourished and well-hydrated in no acute distress. Vitals Time Taken: 10:00 AM, Height: 71 in, Weight: 216 lbs, BMI: 30.1, Temperature: 97.8 F, Pulse: 64 bpm, Respiratory Rate: 18 breaths/min, Blood Pressure: 159/76  mmHg. Respiratory normal breathing without difficulty. Psychiatric this patient is able to make decisions and demonstrates good insight into disease process. Alert and Oriented x 3. pleasant and cooperative. General Notes: Upon inspection patient's wound bed actually showed some signs of hypergranulation but this was minimal I feel like he is actually making pretty good progress here. Fortunately there does not appear to be any signs of active infection at this point.  Integumentary (Hair, Skin) Wound #1 status is Open. Original cause of wound was Pressure Injury. The date acquired was: 04/03/2020. The wound has been in treatment 9 weeks. The wound is located on the Right,Lateral Foot. The wound measures 4.3cm length x 3cm width x 0.2cm depth; 10.132cm^2 area and 2.026cm^3 volume. There is Fat Layer (Subcutaneous Tissue) exposed. There is no tunneling or undermining noted. There is a large amount of serosanguineous drainage noted. The wound margin is thickened. There is large (67-100%) red, hyper - granulation within the wound bed. There is no necrotic tissue within the wound bed. General Notes: gauze in place instead of ABD, maceration periwound Assessment Active Problems ICD-10 Pressure ulcer of right heel, unstageable Repeated falls Long term (current) use of anticoagulants Procedures Wound #1 Pre-procedure diagnosis of Wound #1 is a Pressure Ulcer located on the Right,Lateral Foot . There was a Three Layer Compression Therapy Procedure by Carlene Coria, RN. Post procedure Diagnosis Wound #1: Same as Pre-Procedure Plan Follow-up Appointments: Return Appointment in 1 week. Home Health: [facility] for wound care. May utilize formulary equivalent dressing for wound treatment orders unless otherwise specified. Home Health Nurse may visit PRN to address patient s wound care needs. - [facility] to change on Wednesdays and Fridays , patient to be seen in clinic on mondays [name],  [name] D. ([id number]) Edema Control - Lymphedema / Segmental Compressive Device / Other: Elevate, Exercise Daily and Avoid Standing for Long Periods of Time. Elevate legs to the level of the heart and pump ankles as often as possible Elevate leg(s) parallel to the floor when sitting. Off-Loading: Open toe surgical shoe - right foot WOUND #1: - Foot Wound Laterality: Right, Lateral Cleanser: Byram Ancillary Kit - 15 Day Supply (Generic) 3 x Per Week/30 Days Discharge Instructions: Use supplies as instructed; Kit contains: (15) Saline Bullets; (15) 3x3 Gauze; 15 pr Gloves Cleanser: Normal Saline (Generic) 3 x Per Week/30 Days Discharge Instructions: Wash your hands with soap and water. Remove old dressing, discard into plastic bag and place into trash. Cleanse the wound with Normal Saline prior to applying a clean dressing using gauze sponges, not tissues or cotton balls. Do not scrub or use excessive force. Pat dry using gauze sponges, not tissue or cotton balls. Primary Dressing: Hydrofera Blue Ready Transfer Foam, 2.5x2.5 (in/in) (Generic) 3 x Per Week/30 Days Discharge Instructions: Apply Hydrofera Blue Ready to wound bed as directed Secondary Dressing: Xtrasorb Medium 4x5 (in/in) (Generic) 3 x Per Week/30 Days Discharge Instructions: Apply to wound as directed. Do not cut. Compression Wrap: Profore Lite LF 3 Multilayer Compression Bandaging System (Generic) 3 x Per Week/30 Days Discharge Instructions: Apply 3 multi-layer wrap as prescribed. 1. Would recommend currently that we going continue with the wound care measures as before and the patient is in agreement with the plan. This includes the use of the [facility] dressing. Were also can use XtraSorb over top of this. 2. I am also can recommend that we continue with the compression wrap. Specifically we will get a be using a 3 layer compression wrap which I think is doing a great job. 4. The patient should also continue to elevate his  legs much as possible try to help with edema control. We will see patient back for reevaluation in 1 week here in the clinic. If anything worsens or changes patient will contact our office for additional recommendations. Electronic Signature(s) Signed: 08/05/2020 1:53:15 PM By: Worthy Keeler PA-C Entered By: Worthy Keeler on 08/05/2020 13:53:15  [name], [name] ([id number]) -------------------------------------------------------------------------------- SuperBill Details [name], [name] Date of Service: 08/05/2020 Medical Record Number: [id number] Patient Account Number: [id number] Date of Birth/Sex: February 10, 1935 (86 y.o. M) Treating RN: Carlene Coria Primary Care Provider: Nobie Putnam Other Clinician: Jeanine Luz Referring Provider: Nobie Putnam Treating Provider/Extender: Skipper Cliche in Treatment: 9 Diagnosis Coding ICD-10 Codes Code Description L89.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, unstageable R29.6 Repeated falls Z79.01 Long term (current) use of anticoagulants Facility Procedures CPT4 Code: 80881103 Description: (Facility Use Only) [phone] - [place] RT LEG Modifier: Quantity: 1 Physician Procedures CPT4 Code: 9292446 Description: 28638 - WC PHYS LEVEL 3 - EST PT Modifier: Quantity: 1 CPT4 Code: Description: ICD-10 Diagnosis Description L89.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, unstageable R29.6 Repeated falls Z79.01 Long term (current) use of anticoagulants Modifier: Quantity: Electronic Signature(s) Signed: 08/05/2020 1:53:25 PM By: Worthy Keeler PA-C Entered By: Worthy Keeler on 08/05/2020 13:53:25

## 2020-08-06 ENCOUNTER — Ambulatory Visit (INDEPENDENT_AMBULATORY_CARE_PROVIDER_SITE_OTHER): Payer: Medicare Other | Admitting: Urology

## 2020-08-06 ENCOUNTER — Encounter: Payer: Self-pay | Admitting: Urology

## 2020-08-06 VITALS — BP 162/76 | HR 68 | Ht 70.0 in | Wt 217.4 lb

## 2020-08-06 DIAGNOSIS — R3989 Other symptoms and signs involving the genitourinary system: Secondary | ICD-10-CM

## 2020-08-06 DIAGNOSIS — R972 Elevated prostate specific antigen [PSA]: Secondary | ICD-10-CM

## 2020-08-06 NOTE — Progress Notes (Signed)
[name], [name] ([id number]) Visit Report for 08/05/2020 Arrival Information Details [name], [name] Date of Service: 08/05/2020 10:00 AM Medical Record Number: [id number] Patient Account Number: [id number] Date of Birth/Sex: 30-Mar-1935 (86 y.o. M) Treating RN: Dolan Amen Primary Care Retta Pitcher: Nobie Putnam Other Clinician: Jeanine Luz Referring Doranne Schmutz: Nobie Putnam Treating Weston Kallman/Extender: Skipper Cliche in Treatment: 9 Visit Information History Since Last Visit Added or deleted any medications: No Patient Arrived: Ambulatory Had a fall or experienced change in No Arrival Time: 09:59 activities of daily living that may affect Accompanied By: self risk of falls: Transfer Assistance: None Hospitalized since last visit: No Patient Identification Verified: Yes Pain Present Now: No Secondary Verification Process Completed: Yes Patient Has Alerts: Yes Patient Alerts: Not Diabetic Plavix Electronic Signature(s) Signed: 08/05/2020 12:00:51 PM By: Georges Mouse, Minus Breeding RN Entered By: Georges Mouse, Minus Breeding on 08/05/2020 09:59:42 Fulmore, [name] ([id number]) -------------------------------------------------------------------------------- Clinic Level of Care Assessment Details [name] Date of Service: 08/05/2020 10:00 AM Medical Record Number: [id number] Patient Account Number: [id number] Date of Birth/Sex: [birth year] (86 y.o. M) Treating RN: Carlene Coria Primary Care Howell Groesbeck: Nobie Putnam Other Clinician: Jeanine Luz Referring Gabbriella Presswood: Nobie Putnam Treating Hernandez Losasso/Extender: Skipper Cliche in Treatment: 9 Clinic Level of Care Assessment Items TOOL 1 Quantity Score '[]'  - Use when EandM and Procedure is performed on INITIAL visit 0 ASSESSMENTS - Nursing Assessment / Reassessment '[]'  - General Physical Exam (combine w/ comprehensive assessment (listed just below) when performed on new 0 pt.  evals) '[]'  - 0 Comprehensive Assessment (HX, ROS, Risk Assessments, Wounds Hx, etc.) ASSESSMENTS - Wound and Skin Assessment / Reassessment '[]'  - Dermatologic / Skin Assessment (not related to wound area) 0 ASSESSMENTS - Ostomy and/or Continence Assessment and Care '[]'  - Incontinence Assessment and Management 0 '[]'  - 0 Ostomy Care Assessment and Management (repouching, etc.) PROCESS - Coordination of Care '[]'  - Simple Patient / Family Education for ongoing care 0 '[]'  - 0 Complex (extensive) Patient / Family Education for ongoing care '[]'  - 0 Staff obtains Programmer, systems, Records, Test Results / Process Orders '[]'  - 0 Staff telephones HHA, Nursing Homes / Clarify orders / etc '[]'  - 0 Routine Transfer to another Facility (non-emergent condition) '[]'  - 0 Routine Hospital Admission (non-emergent condition) '[]'  - 0 New Admissions / Biomedical engineer / Ordering NPWT, Apligraf, etc. '[]'  - 0 Emergency Hospital Admission (emergent condition) PROCESS - Special Needs '[]'  - Pediatric / Minor Patient Management 0 '[]'  - 0 Isolation Patient Management '[]'  - 0 Hearing / Language / Visual special needs '[]'  - 0 Assessment of Community assistance (transportation, D/C planning, etc.) '[]'  - 0 Additional assistance / Altered mentation '[]'  - 0 Support Surface(s) Assessment (bed, cushion, seat, etc.) INTERVENTIONS - Miscellaneous '[]'  - External ear exam 0 '[]'  - 0 Patient Transfer (multiple staff / Civil Service fast streamer / Similar devices) '[]'  - 0 Simple Staple / Suture removal (25 or less) '[]'  - 0 Complex Staple / Suture removal (26 or more) '[]'  - 0 Hypo/Hyperglycemic Management (do not check if billed separately) '[]'  - 0 Ankle / Brachial Index (ABI) - do not check if billed separately Has the patient been seen at the hospital within the last three years: Yes Total Score: 0 Level Of Care: ____ [name] ([id number]) Electronic Signature(s) Signed: 08/06/2020 7:54:04 AM By: Carlene Coria RN Entered By: Carlene Coria on  08/05/2020 10:25:31 Cobbs, [name] ([id number]) -------------------------------------------------------------------------------- Compression Therapy Details [name] Date of Service: 08/05/2020 10:00 AM Medical  Record Number: [id number] Patient Account Number: [id number] Date of Birth/Sex: Mar 27, 1935 (86 y.o. M) Treating RN: Carlene Coria Primary Care Savvy Peeters: Nobie Putnam Other Clinician: Jeanine Luz Referring Charly Hunton: Nobie Putnam Treating Undra Trembath/Extender: Skipper Cliche in Treatment: 9 Compression Therapy Performed for Wound Assessment: Wound #1 Right,Lateral Foot Performed By: Jake Church, RN Compression Type: Three Layer Post Procedure Diagnosis Same as Pre-procedure Electronic Signature(s) Signed: 08/06/2020 7:54:04 AM By: Carlene Coria RN Entered By: Carlene Coria on 08/05/2020 10:23:48 Pereyra, [name] ([id number]) -------------------------------------------------------------------------------- Encounter Discharge Information Details [name] Date of Service: 08/05/2020 10:00 AM Medical Record Number: [id number] Patient Account Number: [id number] Date of Birth/Sex: [birth year] (86 y.o. M) Treating RN: Donnamarie Poag Primary Care Sahaana Weitman: Nobie Putnam Other Clinician: Jeanine Luz Referring Deverick Pruss: Nobie Putnam Treating Sanaa Zilberman/Extender: Skipper Cliche in Treatment: 9 Encounter Discharge Information Items Discharge Condition: Stable Ambulatory Status: Ambulatory Discharge Destination: Home Transportation: Private Auto Accompanied By: self Schedule Follow-up Appointment: Yes Clinical Summary of Care: Electronic Signature(s) Signed: 08/05/2020 3:11:20 PM By: Donnamarie Poag Entered By: Donnamarie Poag on 08/05/2020 10:39:55 Quin, [name] ([id number]) -------------------------------------------------------------------------------- Lower Extremity Assessment Details Patient Name:  [name] Date of Service: 08/05/2020 10:00 AM Medical Record Number: [id number] Patient Account Number: [id number] Date of Birth/Sex: 21-Aug-1934 (86 y.o. M) Treating RN: Dolan Amen Primary Care Kelcey Korus: Nobie Putnam Other Clinician: Jeanine Luz Referring Levaughn Puccinelli: Nobie Putnam Treating Santana Edell/Extender: Skipper Cliche in Treatment: 9 Edema Assessment Assessed: Shirlyn Goltz: No] Patrice Paradise: Yes] Edema: [Left: Ye] [Right: s] Calf Left: Right: Point of Measurement: 31 cm From Medial Instep 36 cm Ankle Left: Right: Point of Measurement: 12 cm From Medial Instep 25 cm Vascular Assessment Pulses: Dorsalis Pedis Palpable: [Right:Yes] Electronic Signature(s) Signed: 08/05/2020 12:00:51 PM By: Georges Mouse, Minus Breeding RN Entered By: Georges Mouse, Minus Breeding on 08/05/2020 10:12:22 Waibel, [name] ([id number]) -------------------------------------------------------------------------------- Multi Wound Chart Details [name] Date of Service: 08/05/2020 10:00 AM Medical Record Number: [id number] Patient Account Number: [id number] Date of Birth/Sex: 1935/01/06 (86 y.o. M) Treating RN: Carlene Coria Primary Care Stace Peace: Nobie Putnam Other Clinician: Jeanine Luz Referring Narvel Kozub: Nobie Putnam Treating Perl Folmar/Extender: Skipper Cliche in Treatment: 9 Vital Signs Height(in): 71 Pulse(bpm): 45 Weight(lbs): 216 Blood Pressure(mmHg): 159/76 Body Mass Index(BMI): 30 Temperature(F): 97.8 Respiratory Rate(breaths/min): 18 Photos: [N/A:N/A] Wound Location: Right, Lateral Foot N/A N/A Wounding Event: Pressure Injury N/A N/A Primary Etiology: Pressure Ulcer N/A N/A Comorbid History: Cataracts, Hypertension N/A N/A Date Acquired: 04/03/2020 N/A N/A Weeks of Treatment: 9 N/A N/A Wound Status: Open N/A N/A Measurements L x W x D (cm) 4.3x3x0.2 N/A N/A Area (cm) : 10.132 N/A N/A Volume (cm) : 2.026 N/A N/A % Reduction in  Area: 54.80% N/A N/A % Reduction in Volume: 81.90% N/A N/A Classification: Unstageable/Unclassified N/A N/A Exudate Amount: Large N/A N/A Exudate Type: Serosanguineous N/A N/A Exudate Color: red, brown N/A N/A Wound Margin: Thickened N/A N/A Granulation Amount: Large (67-100%) N/A N/A Granulation Quality: Red, Hyper-granulation N/A N/A Necrotic Amount: None Present (0%) N/A N/A Exposed Structures: Fat Layer (Subcutaneous Tissue): N/A N/A Yes Fascia: No Tendon: No Muscle: No Joint: No Bone: No Epithelialization: None N/A N/A Assessment Notes: gauze in place instead of ABD, N/A N/A maceration periwound Treatment Notes Electronic Signature(s) Signed: 08/06/2020 7:54:04 AM By: Carlene Coria RN Entered By: Carlene Coria on 08/05/2020 10:23:32 Teresi, [name] ([id number]) -------------------------------------------------------------------------------- Multi-Disciplinary Care Plan Details [name] Date of Service: 08/05/2020 10:00 AM Medical Record Number: [id number] Patient Account Number: [id number] Date of Birth/Sex: February 17, 1935 (  86 y.o. M) Treating RN: Carlene Coria Primary Care Lenetta Piche: Nobie Putnam Other Clinician: Jeanine Luz Referring Halden Phegley: Nobie Putnam Treating Mellanie Bejarano/Extender: Skipper Cliche in Treatment: 9 Active Inactive Wound/Skin Impairment Nursing Diagnoses: Knowledge deficit related to ulceration/compromised skin integrity Goals: Patient/caregiver will verbalize understanding of skin care regimen Date Initiated: 06/03/2020 Target Resolution Date: 08/31/2020 Goal Status: Active Ulcer/skin breakdown will have a volume reduction of 30% by week 4 Date Initiated: 06/03/2020 Date Inactivated: 07/01/2020 Target Resolution Date: 07/01/2020 Goal Status: Met Ulcer/skin breakdown will have a volume reduction of 50% by week 8 Date Initiated: 07/01/2020 Date Inactivated: 08/05/2020 Target Resolution Date: 08/01/2020 Goal Status:  Met Ulcer/skin breakdown will have a volume reduction of 80% by week 12 Date Initiated: 07/01/2020 Target Resolution Date: 08/31/2020 Goal Status: Active Ulcer/skin breakdown will heal within 14 weeks Date Initiated: 07/01/2020 Target Resolution Date: 10/01/2020 Goal Status: Active Interventions: Assess patient/caregiver ability to obtain necessary supplies Assess patient/caregiver ability to perform ulcer/skin care regimen upon admission and as needed Assess ulceration(s) every visit Notes: Electronic Signature(s) Signed: 08/06/2020 7:54:04 AM By: Carlene Coria RN Entered By: Carlene Coria on 08/05/2020 10:23:24 Toso, [name] ([id number]) -------------------------------------------------------------------------------- Pain Assessment Details [name] Date of Service: 08/05/2020 10:00 AM Medical Record Number: [id number] Patient Account Number: [id number] Date of Birth/Sex: 1934/11/29 (86 y.o. M) Treating RN: Dolan Amen Primary Care Anieya Helman: Nobie Putnam Other Clinician: Jeanine Luz Referring Madlyn Crosby: Nobie Putnam Treating Janaiah Vetrano/Extender: Skipper Cliche in Treatment: 9 Active Problems Location of Pain Severity and Description of Pain Patient Has Paino No Site Locations Rate the pain. Current Pain Level: 0 Pain Management and Medication Current Pain Management: Electronic Signature(s) Signed: 08/05/2020 12:00:51 PM By: Georges Mouse, Minus Breeding RN Entered By: Georges Mouse, Kenia on 08/05/2020 10:03:31 Harris, [name] ([id number]) -------------------------------------------------------------------------------- Patient/Caregiver Education Details [name] Date of Service: 08/05/2020 10:00 AM Medical Record Number: [id number] Patient Account Number: [id number] Date of Birth/Gender: December 22, 1934 (86 y.o. M) Treating RN: Carlene Coria Primary Care Physician: Nobie Putnam Other Clinician: Jeanine Luz Referring Physician: Nobie Putnam Treating Physician/Extender: Skipper Cliche in Treatment: 9 Education Assessment Education Provided To: Patient Education Topics Provided Wound/Skin Impairment: Methods: Explain/Verbal Responses: State content correctly [employer]) Signed: 08/06/2020 7:54:04 AM By: Carlene Coria RN Entered By: Carlene Coria on 08/05/2020 10:26:00 Callegari, [name] ([id number]) -------------------------------------------------------------------------------- Wound Assessment Details [name] Date of Service: 08/05/2020 10:00 AM Medical Record Number: [id number] Patient Account Number: [id number] Date of Birth/Sex: 1935-02-02 (86 y.o. M) Treating RN: Dolan Amen Primary Care Lima Chillemi: Nobie Putnam Other Clinician: Jeanine Luz Referring Shawnte Winton: Nobie Putnam Treating Jermale Crass/Extender: Skipper Cliche in Treatment: 9 Wound Status Wound Number: 1 Primary Etiology: Pressure Ulcer Wound Location: Right, Lateral Foot Wound Status: Open Wounding Event: Pressure Injury Comorbid History: Cataracts, Hypertension Date Acquired: 04/03/2020 Weeks Of Treatment: 9 Clustered Wound: No Photos Wound Measurements Length: (cm) 4.3 % Red Width: (cm) 3 % Red Depth: (cm) 0.2 Epith Area: (cm) 10.132 Tunn Volume: (cm) 2.026 Unde uction in Area: 54.8% uction in Volume: 81.9% elialization: None eling: No rmining: No Wound Description Classification: Unstageable/Unclassified Foul Wound Margin: Thickened Slou Exudate Amount: Large Exudate Type: Serosanguineous Exudate Color: red, brown Odor After Cleansing: No gh/Fibrino No Wound Bed Granulation Amount: Large (67-100%) Exposed Structure Granulation Quality: Red, Hyper-granulation Fascia Exposed: No Necrotic Amount: None Present (0%) Fat Layer (Subcutaneous Tissue) Exposed: Yes Tendon Exposed: No Muscle Exposed: No Joint Exposed: No Bone  Exposed: No Assessment Notes gauze in place instead of ABD,  maceration periwound Treatment Notes Wound #1 (Foot) Wound Laterality: Right, Lateral Cleanser [place] Ancillary Kit - [address] JIMY, [place] ([id number]) Discharge Instruction: Use supplies as instructed; Kit contains: (15) Saline Bullets; (15) 3x3 Gauze; 15 pr Gloves Normal Saline Discharge Instruction: Wash your hands with soap and water. Remove old dressing, discard into plastic bag and place into trash. Cleanse the wound with Normal Saline prior to applying a clean dressing using gauze sponges, not tissues or cotton balls. Do not scrub or use excessive force. Pat dry using gauze sponges, not tissue or cotton balls. Peri-Wound Care Topical Primary Dressing Hydrofera Blue Ready Transfer Foam, 2.5x2.5 (in/in) Discharge Instruction: Apply Hydrofera Blue Ready to wound bed as directed Secondary Dressing Xtrasorb Medium 4x5 (in/in) Discharge Instruction: Apply to wound as directed. Do not cut. Secured With Compression Wrap Profore Lite LF 3 Multilayer Compression Bandaging System Discharge Instruction: Apply 3 multi-layer wrap as prescribed. Compression Stockings Add-Ons Electronic Signature(s) Signed: 08/05/2020 12:00:51 PM By: Georges Mouse, Minus Breeding RN Entered By: Georges Mouse, Kenia on 08/05/2020 10:11:13 Christiana, [name] ([id number]) -------------------------------------------------------------------------------- Vitals Details [name] Date of Service: 08/05/2020 10:00 AM Medical Record Number: [id number] Patient Account Number: [id number] Date of Birth/Sex: 03/24/1935 (86 y.o. M) Treating RN: Dolan Amen Primary Care Keoni Risinger: Nobie Putnam Other Clinician: Jeanine Luz Referring Jhamir Pickup: Nobie Putnam Treating Xzayvier Fagin/Extender: Skipper Cliche in Treatment: 9 Vital Signs Time Taken: 10:00 Temperature (F): 97.8 Height (in): 71 Pulse (bpm): 64 Weight (lbs):  216 Respiratory Rate (breaths/min): 18 Body Mass Index (BMI): 30.1 Blood Pressure (mmHg): 159/76 Reference Range: 80 - 120 mg / dl Electronic Signature(s) Signed: 08/05/2020 12:00:51 PM By: Georges Mouse, Minus Breeding RN Entered By: Georges Mouse, Minus Breeding on 08/05/2020 10:03:16

## 2020-08-07 DIAGNOSIS — N183 Chronic kidney disease, stage 3 unspecified: Secondary | ICD-10-CM | POA: Diagnosis not present

## 2020-08-07 DIAGNOSIS — I129 Hypertensive chronic kidney disease with stage 1 through stage 4 chronic kidney disease, or unspecified chronic kidney disease: Secondary | ICD-10-CM | POA: Diagnosis not present

## 2020-08-07 DIAGNOSIS — E785 Hyperlipidemia, unspecified: Secondary | ICD-10-CM | POA: Diagnosis not present

## 2020-08-07 DIAGNOSIS — R300 Dysuria: Secondary | ICD-10-CM | POA: Diagnosis not present

## 2020-08-07 DIAGNOSIS — I69315 Cognitive social or emotional deficit following cerebral infarction: Secondary | ICD-10-CM | POA: Diagnosis not present

## 2020-08-07 DIAGNOSIS — L89612 Pressure ulcer of right heel, stage 2: Secondary | ICD-10-CM | POA: Diagnosis not present

## 2020-08-08 ENCOUNTER — Telehealth: Payer: Self-pay | Admitting: General Practice

## 2020-08-08 ENCOUNTER — Ambulatory Visit: Payer: Self-pay | Admitting: General Practice

## 2020-08-08 DIAGNOSIS — I10 Essential (primary) hypertension: Secondary | ICD-10-CM

## 2020-08-08 DIAGNOSIS — E782 Mixed hyperlipidemia: Secondary | ICD-10-CM | POA: Diagnosis not present

## 2020-08-08 DIAGNOSIS — I129 Hypertensive chronic kidney disease with stage 1 through stage 4 chronic kidney disease, or unspecified chronic kidney disease: Secondary | ICD-10-CM | POA: Diagnosis not present

## 2020-08-08 DIAGNOSIS — L97513 Non-pressure chronic ulcer of other part of right foot with necrosis of muscle: Secondary | ICD-10-CM

## 2020-08-08 DIAGNOSIS — R413 Other amnesia: Secondary | ICD-10-CM

## 2020-08-08 DIAGNOSIS — N183 Chronic kidney disease, stage 3 unspecified: Secondary | ICD-10-CM | POA: Diagnosis not present

## 2020-08-08 NOTE — Patient Instructions (Signed)
Visit Information  PATIENT GOALS: Goals Addressed            This Visit's Progress   . RNCM: Manage My Medicine       Timeframe:  Short-Term Goal Priority:  High Start Date:                             Expected End Date:                       Follow Up Date 10-17-2020   - call for medicine refill 2 or 3 days before it runs out - call if I am sick and can't take my medicine - keep a list of all the medicines I take; vitamins and herbals too - learn to read medicine labels - use a pillbox to sort medicine - use an alarm clock or phone to remind me to take my medicine    Why is this important?   . These steps will help you keep on track with your medicines.   Notes: [name] is interested in getting the patient on pill packaging system to help him remember to take. Has an appointment next week with pharm D. 08-08-2020: The patient is compliant with his medications at this time. His friend [name] is helping him with management of his medications.       Patient Care Plan: General Social Work (Adult)    Problem Identified: Quality of Life (General Plan of Care)     Long-Range Goal: Quality of Life Maintained   Start Date: 06/19/2020  Priority: Medium  Note:   Timeframe:  Long-Range Goal Priority:  Medium Start Date:   07/09/20                   Expected End Date: 10/09/20                   Follow Up Date- 09/04/20  Current Barrers:  . Limited social support . ADL IADL limitations . Social Isolation . Limited access to caregiver . Inability to perform ADL's independently . Inability to perform IADL's independently . Lacks knowledge of community resource: personal care services  Clinical Social Work Clinical Goal(s):  Marland Kitchen Over the next 120 days, patient will work with SW to address concerns related to lack of care within the home  Interventions: . Patient and family interviewed and appropriate assessments performed . [name] and his wife are close church friends  to patient for over 30 years and have been assisting him with his care. They live in [facility] but have been visit patent regularly. [name]'s number is [phone]. [name] took patient to his wound care office visit yesterday on 07/22/20. [name]'s wife is going to order patient some medical supplies which include a pill box for both morning and evenings. [name] was unaware of patient's urology appointment tomorrow and was appreciative of CCM [name]'s reminder. [name] and spouse will transport him there today on 06/26/20 at 1:30 pm. However, patient was a no show for this appointment. CCM [name] provided [name] with number for urologist which is [phone]. [name] is agreeable to contact office and reschedule appointment with [name]. CCM [name] advised [name] to return CCM Pharmacist's call and he was agreeable to this plan. Update- [name] reports that patient continues to turn his phone off so patient's sister is going to patient's house today to assist him and turn his phone on. [name]  reports that he and his wife continue to assist patient daily with his care giving needs. They were able to get all of his medications (besides the antibiotic for his foot) in a pill pack which will make medication management easier on the patient and family as a whole. [name]'s spouse has a few medication questions and wishes to contact CCM Pharmacist this week for education and assistance. Per [name], patient is doing a lot better since he has been taking his medications as prescribed. [name] reports that patient is more active and is able to get around the house more. [name] reports that [facility] is involved as well.   . Past update-CCM [name] was unable to reach patient or spouse but successfully spoke with his sister on 06/19/20. Sister reports that patient is doing better and that they go to church together each Sunday. She reports that she cares a lot for her brother and is there for him when needed but she is 84  years old and has her own health concerns.   . Provided patient and family with information about available personal care services within the area such as C.H.O.R.E program. Patient and family feel that he will not be eligible for Medicaid. Patient is now on wait list for C.H.O.R.E . Per [name], patient's wound care doctor was not pleased with how his right foot was wrapped by [place] nurse. Family are unsure if patient rewrapped this on his own or not.  . Discussed plans with patient for ongoing care management follow up and provided patient with direct contact information for care management team . Advised patient to considering hiring a private pay caregiver as Medicare does not cover these services . Assisted patient/caregiver with obtaining information about health plan benefits . Provided education and assistance to client regarding Advanced Directives. . Provided education to patient/caregiver regarding level of care options. . Patient was informed that current CCM [name] will be leaving position next month and his next CCM Social Work follow up visit will be with another [name]. Patient was appreciative of support provided and receptive to news . Provided education to patient/caregiver about Hospice and/or Palliative Care services . [name] will place patient on wait list for C.H.O.R.E program during next scheduled CCM appointment---Application completed and patient was successfully added on the wait list for C.H.O.R.E program on 08/08/19. Patient was provided this update. Patient was provided Home Care Providers contact information in the case that he wishes to inquire where he is at on the wait list. Sister was provided this information as well on 06/19/20  Patient Self Care Activities:  . Attends all scheduled provider appointments . Calls provider office for new concerns or questions . Lacks social connections . Unable to perform ADLs independently . Unable to perform IADLs independently  Please see  past updates related to this goal by clicking on the "Past Updates" button in the selected goal    Task: Support and Maintain Acceptable Degree of Health, Comfort and Happiness   Note:   Care Management Activities:    - affirmation provided - community involvement promoted - expression of thoughts about present/future encouraged - independence in all possible areas promoted - life review by storytelling encouraged - patient strengths promoted - psychosocial concerns monitored - self-expression encouraged - sleep diary encouraged - sleep hygiene techniques encouraged - social relationships promoted - strategies to maintain hearing and/or vision promoted - strategies to maintain intimacy promoted - wellness behaviors promoted     Patient Care Plan: RNCM: Memory Loss  Problem Identified: RNCM: Memory Loss   Priority: High    Long-Range Goal: RNCM: Memory Loss   Priority: High  Note:   Current Barriers:  Marland Kitchen Knowledge Deficits related to resources to assist with patient with changes in memory and being forgetful to take medications and care for self  . Chronic Disease Management support and education needs related to memory loss and changes  . Lacks caregiver support.  . Non-adherence to scheduled provider appointments . Non-adherence to prescribed medication regimen . Unable to self administer medications as prescribed . Does not attend all scheduled provider appointments . Does not adhere to prescribed medication regimen . Lacks social connections . Unable to perform IADLs independently . Does not maintain contact with provider office . Does not contact provider office for questions/concerns  Nurse Case Manager Clinical Goal(s):  . patient will work with CCM team and pcp  to address needs related to memory loss and changes in chronic conditions  . patient will attend all scheduled medical appointments: 09-30-2020 . patient will demonstrate improved adherence to prescribed  treatment plan for wound care and other chronic conditions  as evidenced bykeeping appointments, taking medications as prescribed, and working with the CCM team to optimize health and well being  . patient will work with CM team pharmacist to assist with medication needs and pill packaging.  . patient will work with CM clinical social worker to help with resources for help in the home and changes in memory  Interventions:  . 1:1 collaboration with [name], DO regarding development and update of comprehensive plan of care as evidenced by provider attestation and co-signature . Inter-disciplinary care team collaboration (see longitudinal plan of care) . Evaluation of current treatment plan related to memory loss  and patient's adherence to plan as established by provider. 08-08-2020: Spoke to patients friend [name] He has been going to appointments with the patient and helping him manage his medications and care. [name] states he feels the patient is doing much better and improving. [name] and his wife are going to take the patient and his wife on a vacation for a few days.  . Advised patient to call the office for changes or questions  . Provided education to patient re: CCM team and the role of the CCM team in helping the patient meet needs . Reviewed medications with patient and discussed compliance, the friend assisting the patient would like to get the pill packs for the patient. Pharmacist scheduled to call next week. 08-08-2020: The patient is compliant with his medications at this time  . Collaborated with CCM team and pcp  regarding effective management of memory changes and support to the patient  . Reviewed scheduled/upcoming provider appointments including: 09-30-2020  . Social Work referral for memory changes and support . Pharmacy referral for medications management and pill packaging system  . Discussed plans with patient for ongoing care management follow up and  provided patient with direct contact information for care management team  Patient Goals/Self-Care Activities Over the next 120 days, patient will:  - Patient will self administer medications as prescribed Patient will attend all scheduled provider appointments Patient will call pharmacy for medication refills Patient will attend church or other social activities Patient will continue to perform ADL's independently Patient will continue to perform IADL's independently Patient will call provider office for new concerns or questions Patient will work with BSW to address care coordination needs and will continue to work with the clinical team to address health care  and disease management related needs.    - action plan for worsening symptoms mutually developed - community resource information provided - medication list reviewed - medication side effects managed - pain assessed - pain management plan developed - response to pharmacologic therapy monitored - symptom review completed Follow Up Plan: Telephone follow up appointment with care management team member scheduled for: 10-17-2020 at 345 pm       Task: RNCM: Develop Strategies to Manage Behavior   Note:   Care Management Activities:    - action plan for worsening symptoms mutually developed - community resource information provided - medication list reviewed - medication side effects managed - pain assessed - pain management plan developed - response to pharmacologic therapy monitored - symptom review completed       Patient Care Plan: RNCM: Hypertension (Adult)    Problem Identified: RNCM: Hypertension (Hypertension)   Priority: Medium    Long-Range Goal: RNCM: Hypertension Monitored   Priority: Medium  Note:   Objective:  . Last practice recorded BP readings:  . BP Readings from Last 3 Encounters: .  08/06/20 . (!) 162/76 .  06/26/20 . (!) 160/74 .  06/25/20 . 140/84 .    Marland Kitchen Most recent eGFR/CrCl: No results  found for: EGFR  No components found for: CRCL Current Barriers:  Marland Kitchen Knowledge Deficits related to basic understanding of hypertension pathophysiology and self care management . Knowledge Deficits related to understanding of medications prescribed for management of hypertension . Non-adherence to prescribed medication regimen . Non-adherence to scheduled provider appointments . Limited Social Support . Unable to independently manage HTN . Unable to self administer medications as prescribed . Does not attend all scheduled provider appointments . Does not adhere to prescribed medication regimen . Lacks social connections . Unable to perform IADLs independently . Does not maintain contact with provider office . Does not contact provider office for questions/concerns Case Manager Clinical Goal(s):  Marland Kitchen Over the next 120 days, patient will verbalize understanding of plan for hypertension management . Over the next 120 days, patient will attend all scheduled medical appointments: 09-30-2020 . Over the next 120 days, patient will demonstrate improved adherence to prescribed treatment plan for hypertension as evidenced by taking all medications as prescribed, monitoring and recording blood pressure as directed, adhering to low sodium/DASH diet . Over the next 120 days, patient will demonstrate improved health management independence as evidenced by checking blood pressure as directed and notifying PCP if SBP>160 or DBP > 90, taking all medications as prescribe, and adhering to a low sodium diet as discussed. . Over the next 120 days, patient will verbalize basic understanding of hypertension disease process and self health management plan as evidenced by compliance with medications, compliance with dietary habits- heart healthy/ADA Diet, and working with the CCM team to meet health and wellness goals  Interventions:  . Collaboration with [name], DO regarding development and update of  comprehensive plan of care as evidenced by provider attestation and co-signature . Inter-disciplinary care team collaboration (see longitudinal plan of care) . Evaluation of current treatment plan related to hypertension self management and patient's adherence to plan as established by provider. 08-08-2020: The patient has upcoming test for elevated psa level. Saw urologist and wound provider this week. [name] state he is doing well with his medications. No BP readings provided.  . Provided education to patient re: stroke prevention, s/s of heart attack and stroke, DASH diet, complications of uncontrolled blood pressure . Reviewed medications with patient and  discussed importance of compliance . Discussed plans with patient for ongoing care management follow up and provided patient with direct contact information for care management team . Advised patient, providing education and rationale, to monitor blood pressure daily and record, calling PCP for findings outside established parameters.  . Reviewed scheduled/upcoming provider appointments including: 09-30-2020 Patient Goals: - blood pressure trends reviewed - depression screen reviewed - home or ambulatory blood pressure monitoring encouraged Self-Care Activities: - Self administers medications as prescribed Attends all scheduled provider appointments Calls provider office for new concerns, questions, or BP outside discussed parameters Checks BP and records as discussed Follows a low sodium diet/DASH diet Follow Up Plan: Telephone follow up appointment with care management team member scheduled for: 10-17-2020 at 345 pm   Task: RNCM: Identify and Monitor Blood Pressure Elevation   Note:   Care Management Activities:    - blood pressure trends reviewed - depression screen reviewed - home or ambulatory blood pressure monitoring encouraged       Patient Care Plan: RNCM: HLD Management    Problem Identified: RNCM: Management of HLD    Priority: Medium    Long-Range Goal: RNCM: Management of HLD   Priority: Medium  Note:   Current Barriers:  . Poorly controlled hyperlipidemia, complicated by Memory changes and non-compliance with medications  . Current antihyperlipidemic regimen: Atorvastatin 40 mg QD  . Most recent lipid panel:     Component Value Date/Time   CHOL 163 06/13/2020 1113   TRIG 100 06/13/2020 1113   HDL 37 (L) 06/13/2020 1113   CHOLHDL 4.4 06/13/2020 1113   VLDL 21 07/20/2016 0001   LDLCALC 106 (H) 06/13/2020 1113 .   Marland Kitchen ASCVD risk enhancing conditions: age >26,  HTN, former smoker . Unable to independently manage chronic conditions due to memory impairment  . Unable to self administer medications as prescribed . Does not attend all scheduled provider appointments . Does not adhere to prescribed medication regimen . Lacks social connections . Unable to perform IADLs independently . Does not maintain contact with provider office . Does not contact provider office for questions/concerns RN Care Manager Clinical Goal(s):  . patient will work with Consulting civil engineer, providers, and care team towards execution of optimized self-health management plan . patient will verbalize understanding of plan for effective management of HLD  . patient will work with [facility], CCM team and pcp  to address needs related to effective management of HLD  . patient will attend all scheduled medical appointments: 09-30-2020  Interventions: . Collaboration with [name], DO regarding development and update of comprehensive plan of care as evidenced by provider attestation and co-signature . Inter-disciplinary care team collaboration (see longitudinal plan of care) . Medication review performed; medication list updated in electronic medical record.  Bertram Savin care team collaboration (see longitudinal plan of care) . Referred to pharmacy team for assistance with HLD medication management . Evaluation of  current treatment plan related to HLD  and patient's adherence to plan as established by provider. 08-08-2020: The patient is doing well at this time and denies any acute distress. Is compliant with medications and dietary restrictions. Doing much better with managing his chronic conditions with the help of his friend [name]  . Advised patient to call the office for changes in conditions or questions . Provided education to patient re: effective management of HLD  . Reviewed medications with patient and discussed compliance, the patient has not been taking medications as prescribed, his friend [name] has been  helping him this week and they feel like they are back on track. Does want to talk to the pharmacist about pill packaging system.  [name] with CCM team and pcp  regarding changes in the patients condition and new needs the patient has.   . Reviewed scheduled/upcoming provider appointments including: Reviewed with [name] all upcoming appointments the patient has and next pcp appointment on 09-30-2020 . Discussed plans with patient for ongoing care management follow up and provided patient with direct contact information for care management team Patient Goals/Self-Care Activities: - call for medicine refill 2 or 3 days before it runs out - call if I am sick and can't take my medicine - keep a list of all the medicines I take; vitamins and herbals too - learn to read medicine labels - use a pillbox to sort medicine - use an alarm clock or phone to remind me to take my medicine - change to whole grain breads, cereal, pasta - drink 6 to 8 glasses of water each day - eat 3 to 5 servings of fruits and vegetables each day - eat 5 or 6 small meals each day - fill half the plate with nonstarchy vegetables - limit fast food meals to no more than 1 per week - manage portion size - prepare main meal at home 3 to 5 days each week - read food labels for fat, fiber, carbohydrates and  portion size - be open to making changes - I can manage, know and watch for signs of a heart attack - if I have chest pain, call for help - learn about small changes that will make a big difference - learn my personal risk factors  Follow Up Plan: Telephone follow up appointment with care management team member scheduled for: 10-17-2020 at 345 pm     Task: RNCM: Management of HLD   Note:   Care Management Activities:    - barriers to meeting goals identified - change-talk evoked - choices provided - collaboration with team encouraged - decision-making supported - difficulty of making life-long changes acknowledged - health risks reviewed - problem-solving facilitated - questions answered - readiness for change evaluated - reassurance provided - resources needed to meet goals identified - self-reflection promoted - self-reliance encouraged       Patient Care Plan: RNCM: Management of wound to right heel    Problem Identified: RNCM: Management of wound to right heel   Priority: High    Long-Range Goal: RNCM: Management of Right heel wound   Priority: High  Note:   Current Barriers:  Marland Kitchen Knowledge Deficits related to resources to help in the management of chronic condtions . Chronic Disease Management support and education needs related to wound to right heel . Lacks caregiver support.  . Non-adherence to scheduled provider appointments . Non-adherence to prescribed medication regimen . Unable to independently manage wound to right heel  . Unable to self administer medications as prescribed . Does not attend all scheduled provider appointments . Does not adhere to prescribed medication regimen . Lacks social connections . Unable to perform IADLs independently . Does not maintain contact with provider office . Does not contact provider office for questions/concerns  Nurse Case Manager Clinical Goal(s):  . patient will verbalize understanding of plan for effective  management of wound to right heel  . patient will work with [facility] team, pcp and specialist  to address needs related to wound to right heel  . patient will attend all scheduled medical  appointments: 09-30-2020 and wound center appointments  . patient will demonstrate improved adherence to prescribed treatment plan for wound to right heel as evidenced bykeeping appointments, taking medications as prescribed, healing wound, and working with the CCM team to effectively manage health and well being   Interventions:  . 1:1 collaboration with [name], DO regarding development and update of comprehensive plan of care as evidenced by provider attestation and co-signature . Inter-disciplinary care team collaboration (see longitudinal plan of care) . Evaluation of current treatment plan related to wound care to right heel  and patient's adherence to plan as established by provider. 08-08-2020: Per [name] and wound clinic notes the wound is actually healing and granulation tissue is present. The patient is managing his care with the help of his friend [name] Will continue to follow up with the wound clinic.  . Advised patient to call for new concerns or questions  . Provided education to patient re: keeping appointments with the wound clinic and following recommendations by the specialist for effective healing of right heel wound  . Collaborated with RNCM, CCM team, pcp and specialist  regarding care of right heel wound  . Reviewed scheduled/upcoming provider appointments including: 09-30-2020, also reviewed upcoming appointments to the wound center with [name], friend for the patient.  . Discussed plans with patient for ongoing care management follow up and provided patient with direct contact information for care management team  Patient Goals/Self-Care Activities Over the next 120 days, patient will:  - Patient will self administer medications as prescribed Patient will attend  all scheduled provider appointments Patient will call pharmacy for medication refills Patient will attend church or other social activities Patient will continue to perform ADL's independently Patient will continue to perform IADL's independently Patient will call provider office for new concerns or questions Patient will work with BSW to address care coordination needs and will continue to work with the clinical team to address health care and disease management related needs.    - barriers to meeting goals identified - change-talk evoked - choices provided - collaboration with team encouraged - decision-making supported - health risks reviewed - problem-solving facilitated - questions answered - readiness for change evaluated - reassurance provided - resources needed to meet goals identified - self-reflection promoted - self-reliance encouraged Follow Up Plan: Telephone follow up appointment with care management team member scheduled for: 10-17-2020 at 345 pm       Task: RNCM: Wound to Right heel   Note:   Care Management Activities:    - barriers to meeting goals identified - change-talk evoked - choices provided - collaboration with team encouraged - decision-making supported - health risks reviewed - problem-solving facilitated - questions answered - readiness for change evaluated - reassurance provided - resources needed to meet goals identified - self-reflection promoted - self-reliance encouraged     Patient Care Plan: PharmD - Med Mgmt    Problem Identified: Disease Progression     Long-Range Goal: Disease Progression Prevented or Minimized   Start Date: 07/03/2020  Expected End Date: 10/01/2020  This Visit's Progress: On track  Recent Progress: On track  Priority: High  Note:   Current Barriers:  . Unable to self-administer medications as prescribed . Chronic Disease Management support and education needs related to memory loss and changes . Lack of  blood pressure readings for clinical team  Pharmacist Clinical Goal(s):   Over the next 90 days, patient will achieve adherence to monitoring guidelines and medication adherence to achieve  therapeutic efficacy through collaboration with PharmD and provider.   Interventions: . 1:1 collaboration with [name], DO regarding development and update of comprehensive plan of care as evidenced by provider attestation and co-signature . Inter-disciplinary care team collaboration (see longitudinal plan of care) . Perform chart review o Patient seen for Office Visit with Wound Care on 3/28 o Patient missed appointment with Urology on 3/29 . Today speak with both patient as well as patient's friend/caregiver, [name] [place] RN helping patient with wound care/dressings at home . Encourage follow up with Urology to reschedule missed appointment. Caregiver confirms having phone number for office . Caregiver reports plans to attend next Wound Care appointment with patient on 4/4  Medication Adherence: . Caregiver and patient confirm patient now taking medications from pill pack as filled by [place] RN also helping patient with managing medications o Reports patient has completed course of levofloxacin from Wound Care . Again encourage caregiver to label pill pack with days of the week to aid with adherence  Hypertension: . Current treatment: o Lisinopril 20 mg QAM o Terazosin 5 mg QHS . Per review of chart, note BP 143/73,  Pulse 75 at Office Visit with Wound Care on 3/28 . Patient has a home wrist monitor . Have encouraged patient to monitor home blood pressure, keep log of results and have record to review during next telephone outreach  Patient Goals/Self-Care Activities  Over the next 90 days, patient will:  - focus on medication adherence by using weekly pillbox with assistance from/as filled by friend, [name]  Follow Up  Plan: Telephone follow up appointment with care management team member scheduled for: 08/19/2020 at 10:30 AM      The patient verbalized understanding of instructions, educational materials, and care plan provided today and declined offer to receive copy of patient instructions, educational materials, and care plan.   Telephone follow up appointment with care management team member scheduled for:10-17-2020 at 80 pm  [name] RN, MSN, [place] Mobile: [phone]

## 2020-08-08 NOTE — Chronic Care Management (AMB) (Signed)
Chronic Care Management   CCM RN Visit Note  08/08/2020 Name: [name] MRN: [id number] DOB: 12/15/1934  Subjective: [name] is a 86 y.o. year old male who is a primary care patient of Jonathan Hauser, DO. The care management team was consulted for assistance with disease management and care coordination needs.    Engaged with patient by telephone for follow up visit in response to provider referral for case management and/or care coordination services.   Consent to Services:  The patient was given information about Chronic Care Management services, agreed to services, and gave verbal consent prior to initiation of services.  Please see initial visit note for detailed documentation.   Patient agreed to services and verbal consent obtained.   Assessment: Review of patient past medical history, allergies, medications, health status, including review of consultants reports, laboratory and other test data, was performed as part of comprehensive evaluation and provision of chronic care management services.   SDOH (Social Determinants of Health) assessments and interventions performed:    CCM Care Plan  No Known Allergies  Outpatient Encounter Medications as of 08/08/2020  Medication Sig  . aspirin EC 81 MG tablet Take 1 tablet (81 mg total) by mouth in the morning. (blood thinner)  . atorvastatin (LIPITOR) 40 MG tablet Take 1 tablet (40 mg total) by mouth at bedtime. (Cholesterol)  . clopidogrel (PLAVIX) 75 MG tablet Take 1 tablet (75 mg total) by mouth in the morning. (blood thinner)  . lisinopril (ZESTRIL) 20 MG tablet Take 1 tablet (20 mg total) by mouth in the morning. (Blood pressure)  . terazosin (HYTRIN) 5 MG capsule Take 1 capsule (5 mg total) by mouth at bedtime. (blood pressure, prostate)   No facility-administered encounter medications on file as of 08/08/2020.    Patient Active Problem List   Diagnosis Date Noted  . 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 Essential hypertension 06/25/2020  . Imbalance 02/01/2018  . Humeral head fracture, left, closed, initial encounter 09/22/2017  . History of cerebrovascular accident (CVA) with residual deficit 09/09/2017  . CKD (chronic kidney disease), stage III ([place]) 09/01/2017  . Elevated PSA, between 10 and less than 20 ng/ml 07/22/2016  . Bilateral lower extremity edema 07/22/2016  . Abnormal glucose 07/15/2016  . BPH with obstruction/lower urinary tract symptoms 04/10/2016  . Benign hypertension with CKD (chronic kidney disease) stage III ([place]) 04/10/2016  . Memory loss 04/10/2016  . Gastric outlet obstruction 12/02/2014  . Generalized abdominal pain   . Benign fibroma of prostate 01/02/2011  . HLD (hyperlipidemia) 01/02/2011  . Obesity (BMI 30.0-34.9) 01/02/2011    Conditions to be addressed/monitored:HTN, HLD and Memory loss and wound to right heel  Care Plan : RNCM: Memory Loss  Updates made by [name] since 08/08/2020 12:00 AM    Problem: RNCM: Memory Loss   Priority: High    Long-Range Goal: RNCM: Memory Loss   Priority: High  Note:   Current Barriers:  Marland Kitchen Knowledge Deficits related to resources to assist with patient with changes in memory and being forgetful to take medications and care for self  . Chronic Disease Management support and education needs related to memory loss and changes  . Lacks caregiver support.  . Non-adherence to scheduled provider appointments . Non-adherence to prescribed medication regimen . Unable to self administer medications as prescribed . Does not attend all scheduled provider appointments . Does not adhere to prescribed medication regimen . Lacks social connections .  Unable to perform IADLs independently . Does not maintain contact with provider office . Does not contact provider office for questions/concerns  Nurse Case Manager Clinical Goal(s):  . patient will work with CCM team and pcp  to  address needs related to memory loss and changes in chronic conditions  . patient will attend all scheduled medical appointments: 09-30-2020 . patient will demonstrate improved adherence to prescribed treatment plan for wound care and other chronic conditions  as evidenced bykeeping appointments, taking medications as prescribed, and working with the CCM team to optimize health and well being  . patient will work with CM team pharmacist to assist with medication needs and pill packaging.  . patient will work with CM clinical social worker to help with resources for help in the home and changes in memory  Interventions:  . 1:1 collaboration with Jonathan Hauser, DO regarding development and update of comprehensive plan of care as evidenced by provider attestation and co-signature . Inter-disciplinary care team collaboration (see longitudinal plan of care) . Evaluation of current treatment plan related to memory loss  and patient's adherence to plan as established by provider. 08-08-2020: Spoke to patients friend [name] He has been going to appointments with the patient and helping him manage his medications and care. [name] states he feels the patient is doing much better and improving. [name] and his wife are going to take the patient and his wife on a vacation for a few days.  . Advised patient to call the office for changes or questions  . Provided education to patient re: CCM team and the role of the CCM team in helping the patient meet needs . Reviewed medications with patient and discussed compliance, the friend assisting the patient would like to get the pill packs for the patient. Pharmacist scheduled to call next week. 08-08-2020: The patient is compliant with his medications at this time  . Collaborated with CCM team and pcp  regarding effective management of memory changes and support to the patient  . Reviewed scheduled/upcoming provider appointments including: 09-30-2020  . Social  Work referral for memory changes and support . Pharmacy referral for medications management and pill packaging system  . Discussed plans with patient for ongoing care management follow up and provided patient with direct contact information for care management team  Patient Goals/Self-Care Activities Over the next 120 days, patient will:  - Patient will self administer medications as prescribed Patient will attend all scheduled provider appointments Patient will call pharmacy for medication refills Patient will attend church or other social activities Patient will continue to perform ADL's independently Patient will continue to perform IADL's independently Patient will call provider office for new concerns or questions Patient will work with BSW to address care coordination needs and will continue to work with the clinical team to address health care and disease management related needs.    - action plan for worsening symptoms mutually developed - community resource information provided - medication list reviewed - medication side effects managed - pain assessed - pain management plan developed - response to pharmacologic therapy monitored - symptom review completed Follow Up Plan: Telephone follow up appointment with care management team member scheduled for: 10-17-2020 at 345 pm       Care Plan : RNCM: Hypertension (Adult)  Updates made by [name] since 08/08/2020 12:00 AM    Problem: RNCM: Hypertension (Hypertension)   Priority: Medium    Long-Range Goal: RNCM: Hypertension Monitored   Priority: Medium  Note:  Objective:  . Last practice recorded BP readings:  . BP Readings from Last 3 Encounters: .  08/06/20 . (!) 162/76 .  06/26/20 . (!) 160/74 .  06/25/20 . 140/84 .    Marland Kitchen Most recent eGFR/CrCl: No results found for: EGFR  No components found for: CRCL Current Barriers:  Marland Kitchen Knowledge Deficits related to basic understanding of hypertension pathophysiology and  self care management . Knowledge Deficits related to understanding of medications prescribed for management of hypertension . Non-adherence to prescribed medication regimen . Non-adherence to scheduled provider appointments . Limited Social Support . Unable to independently manage HTN . Unable to self administer medications as prescribed . Does not attend all scheduled provider appointments . Does not adhere to prescribed medication regimen . Lacks social connections . Unable to perform IADLs independently . Does not maintain contact with provider office . Does not contact provider office for questions/concerns Case Manager Clinical Goal(s):  Marland Kitchen Over the next 120 days, patient will verbalize understanding of plan for hypertension management . Over the next 120 days, patient will attend all scheduled medical appointments: 09-30-2020 . Over the next 120 days, patient will demonstrate improved adherence to prescribed treatment plan for hypertension as evidenced by taking all medications as prescribed, monitoring and recording blood pressure as directed, adhering to low sodium/DASH diet . Over the next 120 days, patient will demonstrate improved health management independence as evidenced by checking blood pressure as directed and notifying PCP if SBP>160 or DBP > 90, taking all medications as prescribe, and adhering to a low sodium diet as discussed. . Over the next 120 days, patient will verbalize basic understanding of hypertension disease process and self health management plan as evidenced by compliance with medications, compliance with dietary habits- heart healthy/ADA Diet, and working with the CCM team to meet health and wellness goals  Interventions:  . Collaboration with Jonathan Hauser, DO regarding development and update of comprehensive plan of care as evidenced by provider attestation and co-signature . Inter-disciplinary care team collaboration (see longitudinal plan of  care) . Evaluation of current treatment plan related to hypertension self management and patient's adherence to plan as established by provider. 08-08-2020: The patient has upcoming test for elevated psa level. Saw urologist and wound provider this week. [name] state he is doing well with his medications. No BP readings provided.  . Provided education to patient re: stroke prevention, s/s of heart attack and stroke, DASH diet, complications of uncontrolled blood pressure . Reviewed medications with patient and discussed importance of compliance . Discussed plans with patient for ongoing care management follow up and provided patient with direct contact information for care management team . Advised patient, providing education and rationale, to monitor blood pressure daily and record, calling PCP for findings outside established parameters.  . Reviewed scheduled/upcoming provider appointments including: 09-30-2020 Patient Goals: - blood pressure trends reviewed - depression screen reviewed - home or ambulatory blood pressure monitoring encouraged Self-Care Activities: - Self administers medications as prescribed Attends all scheduled provider appointments Calls provider office for new concerns, questions, or BP outside discussed parameters Checks BP and records as discussed Follows a low sodium diet/DASH diet Follow Up Plan: Telephone follow up appointment with care management team member scheduled for: 10-17-2020 at 345 pm   Care Plan : RNCM: HLD Management  Updates made by [name] since 08/08/2020 12:00 AM    Problem: RNCM: Management of HLD   Priority: Medium    Long-Range Goal: RNCM: Management of HLD  Priority: Medium  Note:   Current Barriers:  . Poorly controlled hyperlipidemia, complicated by Memory changes and non-compliance with medications  . Current antihyperlipidemic regimen: Atorvastatin 40 mg QD  . Most recent lipid panel:     Component Value Date/Time   CHOL 163  06/13/2020 1113   TRIG 100 06/13/2020 1113   HDL 37 (L) 06/13/2020 1113   CHOLHDL 4.4 06/13/2020 1113   VLDL 21 07/20/2016 0001   LDLCALC 106 (H) 06/13/2020 1113 .   Marland Kitchen ASCVD risk enhancing conditions: age >25,  HTN, former smoker . Unable to independently manage chronic conditions due to memory impairment  . Unable to self administer medications as prescribed . Does not attend all scheduled provider appointments . Does not adhere to prescribed medication regimen . Lacks social connections . Unable to perform IADLs independently . Does not maintain contact with provider office . Does not contact provider office for questions/concerns RN Care Manager Clinical Goal(s):  . patient will work with Consulting civil engineer, providers, and care team towards execution of optimized self-health management plan . patient will verbalize understanding of plan for effective management of HLD  . patient will work with [facility], CCM team and pcp  to address needs related to effective management of HLD  . patient will attend all scheduled medical appointments: 09-30-2020  Interventions: . Collaboration with Jonathan Hauser, DO regarding development and update of comprehensive plan of care as evidenced by provider attestation and co-signature . Inter-disciplinary care team collaboration (see longitudinal plan of care) . Medication review performed; medication list updated in electronic medical record.  Bertram Savin care team collaboration (see longitudinal plan of care) . Referred to pharmacy team for assistance with HLD medication management . Evaluation of current treatment plan related to HLD  and patient's adherence to plan as established by provider. 08-08-2020: The patient is doing well at this time and denies any acute distress. Is compliant with medications and dietary restrictions. Doing much better with managing his chronic conditions with the help of his friend [name]  . Advised patient to  call the office for changes in conditions or questions . Provided education to patient re: effective management of HLD  . Reviewed medications with patient and discussed compliance, the patient has not been taking medications as prescribed, his friend [name] has been helping him this week and they feel like they are back on track. Does want to talk to the pharmacist about pill packaging system.  [name] with CCM team and pcp  regarding changes in the patients condition and new needs the patient has.   . Reviewed scheduled/upcoming provider appointments including: Reviewed with [name] all upcoming appointments the patient has and next pcp appointment on 09-30-2020 . Discussed plans with patient for ongoing care management follow up and provided patient with direct contact information for care management team Patient Goals/Self-Care Activities: - call for medicine refill 2 or 3 days before it runs out - call if I am sick and can't take my medicine - keep a list of all the medicines I take; vitamins and herbals too - learn to read medicine labels - use a pillbox to sort medicine - use an alarm clock or phone to remind me to take my medicine - change to whole grain breads, cereal, pasta - drink 6 to 8 glasses of water each day - eat 3 to 5 servings of fruits and vegetables each day - eat 5 or 6 small meals each day - fill half the plate  with nonstarchy vegetables - limit fast food meals to no more than 1 per week - manage portion size - prepare main meal at home 3 to 5 days each week - read food labels for fat, fiber, carbohydrates and portion size - be open to making changes - I can manage, know and watch for signs of a heart attack - if I have chest pain, call for help - learn about small changes that will make a big difference - learn my personal risk factors  Follow Up Plan: Telephone follow up appointment with care management team member scheduled for: 10-17-2020 at 345 pm      Care Plan : RNCM: Management of wound to right heel  Updates made by [name] since 08/08/2020 12:00 AM    Problem: RNCM: Management of wound to right heel   Priority: High    Long-Range Goal: RNCM: Management of Right heel wound   Priority: High  Note:   Current Barriers:  Marland Kitchen Knowledge Deficits related to resources to help in the management of chronic condtions . Chronic Disease Management support and education needs related to wound to right heel . Lacks caregiver support.  . Non-adherence to scheduled provider appointments . Non-adherence to prescribed medication regimen . Unable to independently manage wound to right heel  . Unable to self administer medications as prescribed . Does not attend all scheduled provider appointments . Does not adhere to prescribed medication regimen . Lacks social connections . Unable to perform IADLs independently . Does not maintain contact with provider office . Does not contact provider office for questions/concerns  Nurse Case Manager Clinical Goal(s):  . patient will verbalize understanding of plan for effective management of wound to right heel  . patient will work with [facility] [place] team, pcp and specialist  to address needs related to wound to right heel  . patient will attend all scheduled medical appointments: 09-30-2020 and wound center appointments  . patient will demonstrate improved adherence to prescribed treatment plan for wound to right heel as evidenced bykeeping appointments, taking medications as prescribed, healing wound, and working with the CCM team to effectively manage health and well being   Interventions:  . 1:1 collaboration with Jonathan Hauser, DO regarding development and update of comprehensive plan of care as evidenced by provider attestation and co-signature . Inter-disciplinary care team collaboration (see longitudinal plan of care) . Evaluation of current treatment plan related to wound care to right  heel  and patient's adherence to plan as established by provider. 08-08-2020: Per [name] and wound clinic notes the wound is actually healing and granulation tissue is present. The patient is managing his care with the help of his friend [name] Will continue to follow up with the wound clinic.  . Advised patient to call for new concerns or questions  . Provided education to patient re: keeping appointments with the wound clinic and following recommendations by the specialist for effective healing of right heel wound  . Collaborated with RNCM, CCM team, pcp and specialist  regarding care of right heel wound  . Reviewed scheduled/upcoming provider appointments including: 09-30-2020, also reviewed upcoming appointments to the wound center with [name], friend for the patient.  . Discussed plans with patient for ongoing care management follow up and provided patient with direct contact information for care management team  Patient Goals/Self-Care Activities Over the next 120 days, patient will:  - Patient will self administer medications as prescribed Patient will attend all scheduled provider appointments Patient  will call pharmacy for medication refills Patient will attend church or other social activities Patient will continue to perform ADL's independently Patient will continue to perform IADL's independently Patient will call provider office for new concerns or questions Patient will work with BSW to address care coordination needs and will continue to work with the clinical team to address health care and disease management related needs.    - barriers to meeting goals identified - change-talk evoked - choices provided - collaboration with team encouraged - decision-making supported - health risks reviewed - problem-solving facilitated - questions answered - readiness for change evaluated - reassurance provided - resources needed to meet goals identified - self-reflection  promoted - self-reliance encouraged Follow Up Plan: Telephone follow up appointment with care management team member scheduled for: 10-17-2020 at 345 pm         Plan:Telephone follow up appointment with care management team member scheduled for:  10-17-2020 at 345 pm  [name] RN, MSN, [place] Mobile: [phone]

## 2020-08-09 DIAGNOSIS — R300 Dysuria: Secondary | ICD-10-CM | POA: Diagnosis not present

## 2020-08-09 DIAGNOSIS — I69315 Cognitive social or emotional deficit following cerebral infarction: Secondary | ICD-10-CM | POA: Diagnosis not present

## 2020-08-09 DIAGNOSIS — N183 Chronic kidney disease, stage 3 unspecified: Secondary | ICD-10-CM | POA: Diagnosis not present

## 2020-08-09 DIAGNOSIS — E785 Hyperlipidemia, unspecified: Secondary | ICD-10-CM | POA: Diagnosis not present

## 2020-08-09 DIAGNOSIS — L89612 Pressure ulcer of right heel, stage 2: Secondary | ICD-10-CM | POA: Diagnosis not present

## 2020-08-09 DIAGNOSIS — I129 Hypertensive chronic kidney disease with stage 1 through stage 4 chronic kidney disease, or unspecified chronic kidney disease: Secondary | ICD-10-CM | POA: Diagnosis not present

## 2020-08-13 ENCOUNTER — Other Ambulatory Visit: Payer: Self-pay

## 2020-08-13 DIAGNOSIS — I10 Essential (primary) hypertension: Secondary | ICD-10-CM | POA: Diagnosis not present

## 2020-08-13 DIAGNOSIS — L89610 Pressure ulcer of right heel, unstageable: Secondary | ICD-10-CM | POA: Diagnosis not present

## 2020-08-13 NOTE — Progress Notes (Signed)
ALEXSIS, BRANSCOM ([id number]) Visit Report for 08/13/2020 Arrival Information Details [name], [name] Date of Service: 08/13/2020 9:00 AM Medical Record Number: [id number] Patient Account Number: [id number] Date of Birth/Sex: 08-Jun-1934 (86 y.o. M) Treating RN: Donnamarie Poag Primary Care Yee Gangi: Nobie Putnam Other Clinician: Referring Luvern Mischke: Nobie Putnam Treating Merilee Wible/Extender: Tito Dine in Treatment: 10 Visit Information History Since Last Visit Added or deleted any medications: No Patient Arrived: Ambulatory Had a fall or experienced change in No Arrival Time: 09:08 activities of daily living that may affect Accompanied By: self risk of falls: Transfer Assistance: None Hospitalized since last visit: No Patient Identification Verified: Yes Has Dressing in Place as Prescribed: Yes Secondary Verification Process Completed: Yes Pain Present Now: No Patient Has Alerts: Yes Patient Alerts: Not Diabetic Plavix Electronic Signature(s) Signed: 08/13/2020 12:39:32 PM By: Donnamarie Poag Entered By: Donnamarie Poag on 08/13/2020 09:09:13 [name], [name] ([id number]) -------------------------------------------------------------------------------- Clinic Level of Care Assessment Details [name] Date of Service: 08/13/2020 9:00 AM Medical Record Number: [id number] Patient Account Number: [id number] Date of Birth/Sex: 06/03/1934 (86 y.o. M) Treating RN: Donnamarie Poag Primary Care Quanta Roher: Nobie Putnam Other Clinician: Referring Dezaria Methot: Nobie Putnam Treating Ophia Shamoon/Extender: Tito Dine in Treatment: 10 Clinic Level of Care Assessment Items TOOL 1 Quantity Score _0  - Use when EandM and Procedure is performed on INITIAL visit 0 ASSESSMENTS - Nursing Assessment / Reassessment _1  - General Physical Exam (combine w/ comprehensive assessment (listed just below) when performed on new 0 pt.  evals) _2  - 0 Comprehensive Assessment (HX, ROS, Risk Assessments, Wounds Hx, etc.) ASSESSMENTS - Wound and Skin Assessment / Reassessment _3  - Dermatologic / Skin Assessment (not related to wound area) 0 ASSESSMENTS - Ostomy and/or Continence Assessment and Care _4  - Incontinence Assessment and Management 0 _5  - 0 Ostomy Care Assessment and Management (repouching, etc.) PROCESS - Coordination of Care _6  - Simple Patient / Family Education for ongoing care 0 _7  - 0 Complex (extensive) Patient / Family Education for ongoing care _8  - 0 Staff obtains Programmer, systems, Records, Test Results / Process Orders _9  - 0 Staff telephones HHA, Nursing Homes / Clarify orders / etc _10  - 0 Routine Transfer to another Facility (non-emergent condition) _11  - 0 Routine Hospital Admission (non-emergent condition) _12  - 0 New Admissions / Biomedical engineer / Ordering NPWT, Apligraf, etc. _13  - 0 Emergency Hospital Admission (emergent condition) PROCESS - Special Needs _14  - Pediatric / Minor Patient Management 0 _15  - 0 Isolation Patient Management _16  - 0 Hearing / Language / Visual special needs _17  - 0 Assessment of Community assistance (transportation, D/C planning, etc.) _18  - 0 Additional assistance / Altered mentation _19  - 0 Support Surface(s) Assessment (bed, cushion, seat, etc.) INTERVENTIONS - Miscellaneous _20  - External ear exam 0 _21  - 0 Patient Transfer (multiple staff / Civil Service fast streamer / Similar devices) _22  - 0 Simple Staple / Suture removal (25 or less) _23  - 0 Complex Staple / Suture removal (26 or more) _24  - 0 Hypo/Hyperglycemic Management (do not check if billed separately) _25  - 0 Ankle / Brachial Index (ABI) - do not check if billed separately Has the patient been seen at the hospital within the last three years: Yes Total Score: 0 Level Of Care: ____ [name] ([id number]) Electronic Signature(s) Signed: 08/13/2020 12:39:32 PM By: Donnamarie Poag Entered By: Donnamarie Poag on  08/13/2020 09:23:30 [name], [name] ([id number]) -------------------------------------------------------------------------------- Compression Therapy Details [name] Date of Service: 08/13/2020 9:00 AM  Medical Record Number: [id number] Patient Account Number: [id number] Date of Birth/Sex: 16-Aug-1934 (86 y.o. M) Treating RN: Donnamarie Poag Primary Care Xian Alves: Nobie Putnam Other Clinician: Referring Caedmon Louque: Nobie Putnam Treating Altus Zaino/Extender: Tito Dine in Treatment: 10 Compression Therapy Performed for Wound Assessment: Wound #1 Right,Lateral Foot Performed By: Junius Argyle, RN Compression Type: Three Layer Electronic Signature(s) Signed: 08/13/2020 12:39:32 PM By: Donnamarie Poag Entered By: Donnamarie Poag on 08/13/2020 09:12:23 [name] ([id number]) -------------------------------------------------------------------------------- Encounter Discharge Information Details [name] Date of Service: 08/13/2020 9:00 AM Medical Record Number: [id number] Patient Account Number: [id number] Date of Birth/Sex: 1934-07-31 (86 y.o. M) Treating RN: Donnamarie Poag Primary Care Tyrah Broers: Nobie Putnam Other Clinician: Referring Meg Niemeier: Nobie Putnam Treating Loucile Posner/Extender: Tito Dine in Treatment: 10 Encounter Discharge Information Items Discharge Condition: Stable Ambulatory Status: Ambulatory Discharge Destination: Home Transportation: Private Auto Accompanied By: self Schedule Follow-up Appointment: Yes Clinical Summary of Care: Electronic Signature(s) Signed: 08/13/2020 12:39:32 PM By: Donnamarie Poag Entered By: Donnamarie Poag on 08/13/2020 09:23:21 [name], [name] ([id number]) -------------------------------------------------------------------------------- Wound Assessment Details Patient Name: Jonathan Moros D. Date of Service: 08/13/2020 9:00 AM Medical Record Number:  [id number] Patient Account Number: [id number] Date of Birth/Sex: 12-Aug-1934 (86 y.o. M) Treating RN: Donnamarie Poag Primary Care Tishana Clinkenbeard: Nobie Putnam Other Clinician: Referring Lachell Rochette: Nobie Putnam Treating Marianela Mandrell/Extender: Tito Dine in Treatment: 10 Wound Status Wound Number: 1 Primary Etiology: Pressure Ulcer Wound Location: Right, Lateral Foot Wound Status: Open Wounding Event: Pressure Injury Comorbid History: Cataracts, Hypertension Date Acquired: 04/03/2020 Weeks Of Treatment: 10 Clustered Wound: No Wound Measurements Length: (cm) 4.3 Width: (cm) 3 Depth: (cm) 0.2 Area: (cm) 10.132 Volume: (cm) 2.026 % Reduction in Area: 54.8% % Reduction in Volume: 81.9% Epithelialization: None Wound Description Classification: Unstageable/Unclassified Wound Margin: Thickened Exudate Amount: Large Exudate Type: Serosanguineous Exudate Color: red, brown Foul Odor After Cleansing: No Slough/Fibrino No Wound Bed Granulation Amount: Large (67-100%) Exposed Structure Granulation Quality: Red, Hyper-granulation Fascia Exposed: No Necrotic Amount: None Present (0%) Fat Layer (Subcutaneous Tissue) Exposed: Yes Tendon Exposed: No Muscle Exposed: No Joint Exposed: No Bone Exposed: No Treatment Notes Wound #1 (Foot) Wound Laterality: Right, Lateral Cleanser [place] Ancillary Kit - 15 Day Supply Discharge Instruction: Use supplies as instructed; Kit contains: (15) Saline Bullets; (15) 3x3 Gauze; 15 pr Gloves Normal Saline Discharge Instruction: Wash your hands with soap and water. Remove old dressing, discard into plastic bag and place into trash. Cleanse the wound with Normal Saline prior to applying a clean dressing using gauze sponges, not tissues or cotton balls. Do not scrub or use excessive force. Pat dry using gauze sponges, not tissue or cotton balls. Peri-Wound Care Topical Primary Dressing Hydrofera Blue Ready Transfer Foam, 2.5x2.5  (in/in) Discharge Instruction: Apply Hydrofera Blue Ready to wound bed as directed Secondary Dressing Xtrasorb Medium 4x5 (in/in) Discharge Instruction: Apply to wound as directed. Do not cut. [name], [name] ([id number]) Secured With Compression Wrap Profore Lite LF 3 Multilayer Compression Bandaging System Discharge Instruction: Apply 3 multi-layer wrap as prescribed. Compression Stockings Add-Ons Electronic Signature(s) Signed: 08/13/2020 12:39:32 PM By: Donnamarie Poag Entered ByDonnamarie Poag on 08/13/2020 09:09:52

## 2020-08-14 ENCOUNTER — Telehealth: Payer: Self-pay | Admitting: Family Medicine

## 2020-08-16 DIAGNOSIS — I129 Hypertensive chronic kidney disease with stage 1 through stage 4 chronic kidney disease, or unspecified chronic kidney disease: Secondary | ICD-10-CM | POA: Diagnosis not present

## 2020-08-16 DIAGNOSIS — E785 Hyperlipidemia, unspecified: Secondary | ICD-10-CM | POA: Diagnosis not present

## 2020-08-16 DIAGNOSIS — L89612 Pressure ulcer of right heel, stage 2: Secondary | ICD-10-CM | POA: Diagnosis not present

## 2020-08-16 DIAGNOSIS — R300 Dysuria: Secondary | ICD-10-CM | POA: Diagnosis not present

## 2020-08-16 DIAGNOSIS — I69315 Cognitive social or emotional deficit following cerebral infarction: Secondary | ICD-10-CM | POA: Diagnosis not present

## 2020-08-16 DIAGNOSIS — N183 Chronic kidney disease, stage 3 unspecified: Secondary | ICD-10-CM | POA: Diagnosis not present

## 2020-08-19 ENCOUNTER — Ambulatory Visit: Payer: Self-pay | Admitting: Pharmacist

## 2020-08-19 ENCOUNTER — Encounter: Payer: Medicare Other | Attending: Physician Assistant | Admitting: Physician Assistant

## 2020-08-19 ENCOUNTER — Other Ambulatory Visit: Payer: Self-pay

## 2020-08-19 DIAGNOSIS — I10 Essential (primary) hypertension: Secondary | ICD-10-CM

## 2020-08-19 DIAGNOSIS — Z7901 Long term (current) use of anticoagulants: Secondary | ICD-10-CM | POA: Diagnosis not present

## 2020-08-19 DIAGNOSIS — L89610 Pressure ulcer of right heel, unstageable: Secondary | ICD-10-CM | POA: Diagnosis not present

## 2020-08-19 DIAGNOSIS — R296 Repeated falls: Secondary | ICD-10-CM | POA: Insufficient documentation

## 2020-08-19 DIAGNOSIS — L89890 Pressure ulcer of other site, unstageable: Secondary | ICD-10-CM | POA: Diagnosis not present

## 2020-08-19 NOTE — Patient Instructions (Signed)
Visit Information  PATIENT GOALS: Goals Addressed            This Visit's Progress   . Pharmacy Goals       Our goal bad cholesterol, or LDL, is less than 70 . This is why it is important to continue taking your atorvastatin.  Please check your home blood pressure, keep a log of the results and bring this with you to your medical appointments.  Feel free to call me with any questions or concerns. I look forward to our next call!    Harlow Asa, PharmD, [facility] [phone]       Caregiver verbalized understanding of instructions, educational materials, and care plan provided today and declined offer to receive copy of patient instructions, educational materials, and care plan.   Telephone follow up appointment with care management team member scheduled for: 6/6 at 10:15 am

## 2020-08-19 NOTE — Progress Notes (Addendum)
Jonathan, Nguyen ([id number]) Visit Report for 08/19/2020 Chief Complaint Document Details [name], [name] Date of Service: 08/19/2020 11:15 AM Medical Record Number: [id number] Patient Account Number: [id number] Date of Birth/Sex: Nov 10, 1934 (86 y.o. M) Treating RN: [name] Primary Care Provider: Nobie Nguyen Other Clinician: Referring Provider: Nobie Nguyen Treating Provider/Extender: [name] in Treatment: 11 Information Obtained from: Patient Chief Complaint Right heel ulcer Electronic Signature(s) Signed: 08/19/2020 11:13:56 AM By: Jonathan Keeler PA-C Entered By: [name] on 08/19/2020 11:13:56 Nguyen, Jonathan Mull ([id number]) -------------------------------------------------------------------------------- HPI Details [name] Date of Service: 08/19/2020 11:15 AM Medical Record Number: [id number] Patient Account Number: [id number] Date of Birth/Sex: [birth year] (86 y.o. M) Treating RN: [name] Primary Care Provider: Nobie Nguyen Other Clinician: Referring Provider: Nobie Nguyen Treating Provider/Extender: [name] in Treatment: 11 History of Present Illness HPI Description: 06/03/2020 upon evaluation today patient actually presents for initial inspection here in our clinic after having gone to urgent care this morning where they were concerned about infection as well as a significant "softball sized wound with eschar" over the heel. With that being said the patient fortunately does not have an area that largely does have an area of eschar which is draining around the edges really not stable and I think we are getting to remove the eschar so we try to clean up the surface of the wound. Fortunately there is no evidence of active infection at this time. I do believe that he is going require some debridement both manually today with scissors and forceps as well as enzymatic/chemically with the  medications. I really feel like that he has a good chance to get this to heal if he keep pressure off of it we did discuss that again today. He does note this has been present for about 2 months. He is not taking any of his current medications that we did put him on the list he tells me that he only occasionally takes them which is obviously not doing anything for him. He does have a cane but he does not use it and his ABI was 1.14 on the right. 06/10/2020 upon evaluation today patient appears to be doing well with regard to his wound all things considered. He still has a lot of necrotic tissue in the central portion of the wound. I think the Iodosorb is still the right thing to do and again the biggest issue is he quit using it as he and his wife felt like that the drainage following putting on the medicine was really "nasty". Nonetheless I explained that the weight Iodoflex breaks down or Iodosorb and that round does make it look really bad but it actually is not as bad as what it seems. In fact it is the normal course of things. 06/24/2020 upon evaluation today patient appears to be doing unfortunately about the same in regard to his heel. I do not think this is any worse. However its become increasingly clear that he has significant dementia in my opinion. We have had multiple instances going on here from the standpoint of trying to get him to take the oral antibiotic. Initially we ordered it for him he picked it up on 1 afternoon and by the next morning we called to make sure that he had gotten it he told us yes but that he had thrown it away. That he was not to take any additional medications. We then told him he needed to be taken this  to get the infection under control he subsequently look for it only found the bottle never took the medication therefore I sent it in a second time for him. After sending this and it looks like he has not really taken it since that time he tells me that he took 7  pills at once although I do not think they were all of the same medication and then following this it made him sick I think these were all of his different medicines. He decided he was not to take any further medication after that point. Nonetheless I do believe he still is going require treatment for this infection the good news is he has a friend who is a very good friend that is with him here today to try to help with figuring out what to do and how going forward this should be taken care of. 07/01/2020 upon evaluation today patient appears to be doing really about the same. His measurements are maybe slightly smaller than previous. Fortunately there is no signs of active infection which is great news. No fevers, chills, nausea, vomiting, or diarrhea. 07/08/2020 upon evaluation today patient appears to be doing better in regard to his wounds. He looks like he finally actually took the Levaquin which has helped with the infection. I think things are doing much better in that regard which is great news. No fevers, chills, nausea, vomiting, or diarrhea. Unfortunately he did have actual Kleenex/tissue paper over the wound on really not sure why his wife put that on there but nonetheless I think were ready to start wrapping this week. 07/15/2020 on evaluation today patient appears to be doing about the same in regard to his wound. Unfortunately there does not appear to be any significant improvement. With that being said he also came in today with the heel of his wrap completely hanging out where the wound was exposed and just rubbing on the bedroom slipper that he has on today. With that being said this is obviously not ideal. I discussed that with the patient I really think that he is getting need to be more careful and conscientious about what is going on with his wrap. With that being said some of this is to know followed on his own that he actually has in my opinion significant dementia which inhibits  his ability to be able to comply. He has a friend with him today who is trying to help out as best he can I am just not certain that [name] is actually able to do what needs to be done here. 07/22/20 upon evaluation today patient appears to be doing well with regard to his wound is measuring a little bit smaller. With that being said he did not have any dressings on whatsoever upon evaluation today it was just gauze wrapped Curlex over his leg. Subsequently the Coban following this really was not the appropriate wrap and more importantly he had no dressing actually on the wound itself. Fortunately there does not appear to be any signs of active infection which is great news. Unfortunately my main concern here is that the patient needs to have appropriate dressings on in order to prevent infection and allow for continued and appropriate healing. 07/29/2020 upon evaluation today patient appears to be doing okay in regard to his wound. Unfortunately still do not have the Hydrofera Blue in place in fact it appears that he had a wet-to-dry dressing again applied to the heel even after our conversation with the home  health supervisor last week where they told us that that is what the nurse had put in place and that we have been contacted to advise [place] that that was being done. We had never been contacted in any such regard prior to that being done. At that point we advised that until they get the [facility] in they could use a silver alginate dressing. Nonetheless again today the patient has a wet-to-dry dressing only on he is very macerated around the edges he is also very hyper granulated in regard to the wound bed both a direct result of inappropriate dressings being applied to this wound. That is a direct cause of issues going on with home health not following orders as prescribed by the clinic here. 08/05/2020 upon evaluation today patient appears to be doing excellent in regard to his wound. He has  been tolerating the dressing changes without complication and overall very pleased with where things stand currently. With that being said the patient did not have the ABD pad in place and I really think he may actually be able to do better with XtraSorb even at this time. Nonetheless I think that if we can get this swelling under control and then turned the edema lessens is weeping then that will be a dramatic improvement. We can work on that currently. 08/19/2020 upon evaluation today patient appears to be doing excellent in regard to his heel ulcer. He has been tolerating the dressing changes without complication. I do feel like he is making good progress which is great news. There does not appear to be any signs of significant infection at this point at all which is also excellent. JAMIRE, SHABAZZ ([id number]) Electronic Signature(s) Signed: 08/19/2020 1:35:08 PM By: Jonathan Keeler PA-C Entered By: [name] on 08/19/2020 13:35:08 Sane, Jonathan Mull ([id number]) -------------------------------------------------------------------------------- Physical Exam Details [name] Date of Service: 08/19/2020 11:15 AM Medical Record Number: [id number] Patient Account Number: [id number] Date of Birth/Sex: 01-06-1935 (85 y.o. M) Treating RN: [name] Primary Care Provider: Nobie Nguyen Other Clinician: Referring Provider: Nobie Nguyen Treating Provider/Extender: [name] in Treatment: 66 Constitutional Well-nourished and well-hydrated in no acute distress. Respiratory normal breathing without difficulty. Psychiatric this patient is able to make decisions and demonstrates good insight into disease process. Alert and Oriented x 3. pleasant and cooperative. Notes Patient's wound bed showed signs of good granulation epithelization at this point. There does not appear to be any evidence of infection which is great news and overall very pleased with where  things stand. I do think the Hydrofera Blue is doing a great job I believe Lauraine Rinne is also needed to try to help control the amount of drainage that he is experiencing. Electronic Signature(s) Signed: 08/19/2020 1:35:29 PM By: Jonathan Keeler PA-C Entered By: [name] on 08/19/2020 13:35:28 [place], Jonathan Mull ([id number]) -------------------------------------------------------------------------------- Physician Orders Details [name] Date of Service: 08/19/2020 11:15 AM Medical Record Number: [id number] Patient Account Number: [id number] Date of Birth/Sex: December 18, 1934 (85 y.o. M) Treating RN: [name] Primary Care Provider: Nobie Nguyen Other Clinician: Referring Provider: Nobie Nguyen Treating Provider/Extender: [name] in Treatment: 11 Verbal / Phone Orders: No Diagnosis Coding ICD-10 Coding Code Description L89.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, unstageable R29.6 Repeated falls Z79.01 Long term (current) use of anticoagulants Follow-up Appointments o Return Appointment in 1 week. [place] for wound care. May utilize formulary equivalent dressing for wound treatment orders unless otherwise  specified. Home Health Nurse may visit PRN to address patientos wound care needs. - [facility] to change on Wednesdays and Fridays , patient to be seen in clinic on mondays Edema Control - Lymphedema / Segmental Compressive Device / Other o Elevate, Exercise Daily and Avoid Standing for Long Periods of Time. o Elevate legs to the level of the heart and pump ankles as often as possible o Elevate leg(s) parallel to the floor when sitting. Off-Loading o Open toe surgical shoe - right foot Wound Treatment Wound #1 - Foot Wound Laterality: Right, Lateral Cleanser: [place]ram Ancillary Kit - 15 Day Supply (Generic) 3 x Per Week/30 Days Discharge Instructions: Use supplies as instructed; Kit contains: (15) Saline  Bullets; (15) 3x3 Gauze; 15 pr Gloves Cleanser: Normal Saline (Generic) 3 x Per Week/30 Days Discharge Instructions: Wash your hands with soap and water. Remove old dressing, discard into plastic bag and place into trash. Cleanse the wound with Normal Saline prior to applying a clean dressing using gauze sponges, not tissues or cotton balls. Do not scrub or use excessive force. Pat dry using gauze sponges, not tissue or cotton balls. Primary Dressing: Hydrofera Blue Ready Transfer Foam, 2.5x2.5 (in/in) (Generic) 3 x Per Week/30 Days Discharge Instructions: Apply Hydrofera Blue Ready to wound bed as directed Secondary Dressing: Xtrasorb Medium 4x5 (in/in) (Generic) 3 x Per Week/30 Days Discharge Instructions: Apply to wound as directed. Do not cut. Compression Wrap: Profore Lite LF 3 Multilayer Compression Bandaging System (Generic) 3 x Per Week/30 Days Discharge Instructions: Apply 3 multi-layer wrap as prescribed. Electronic Signature(s) Signed: 08/19/2020 1:00:54 PM By: [name] Signed: 08/19/2020 5:38:17 PM By: Jonathan Keeler PA-C Entered By: [name] on 08/19/2020 11:38:45 [place], Jonathan Mull ([id number]) -------------------------------------------------------------------------------- Problem List Details [name] Date of Service: 08/19/2020 11:15 AM Medical Record Number: [id number] Patient Account Number: [id number] Date of Birth/Sex: [birth year] (85 y.o. M) Treating RN: [name] Primary Care Provider: Nobie Nguyen Other Clinician: Referring Provider: Nobie Nguyen Treating Provider/Extender: [name] in Treatment: 11 Active Problems ICD-10 Encounter Code Description Active Date MDM Diagnosis L89.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, unstageable 06/03/2020 No Yes R29.6 Repeated falls 06/03/2020 No Yes Z79.01 Long term (current) use of anticoagulants 06/03/2020 No Yes Inactive Problems Resolved Problems Electronic Signature(s) Signed:  08/19/2020 11:13:51 AM By: Jonathan Keeler PA-C Entered By: [name] on 08/19/2020 11:13:51 [place], Jonathan Mull ([id number]) -------------------------------------------------------------------------------- Progress Note Details [name] Date of Service: 08/19/2020 11:15 AM Medical Record Number: [id number] Patient Account Number: [id number] Date of Birth/Sex: [birth year] (85 y.o. M) Treating RN: [name] Primary Care Provider: Nobie Nguyen Other Clinician: Referring Provider: Nobie Nguyen Treating Provider/Extender: [name] in Treatment: 11 Subjective Chief Complaint Information obtained from Patient Right heel ulcer History of Present Illness (HPI) 06/03/2020 upon evaluation today patient actually presents for initial inspection here in our clinic after having gone to urgent care this morning where they were concerned about infection as well as a significant "softball sized wound with eschar" over the heel. With that being said the patient fortunately does not have an area that largely does have an area of eschar which is draining around the edges really not stable and I think we are getting to remove the eschar so we try to clean up the surface of the wound. Fortunately there is no evidence of active infection at this time. I do believe that he is going require some debridement both manually today with scissors and forceps as  well as enzymatic/chemically with the medications. I really feel like that he has a good chance to get this to heal if he keep pressure off of it we did discuss that again today. He does note this has been present for about 2 months. He is not taking any of his current medications that we did put him on the list he tells me that he only occasionally takes them which is obviously not doing anything for him. He does have a cane but he does not use it and his ABI was 1.14 on the right. 06/10/2020 upon evaluation today  patient appears to be doing well with regard to his wound all things considered. He still has a lot of necrotic tissue in the central portion of the wound. I think the Iodosorb is still the right thing to do and again the biggest issue is he quit using it as he and his wife felt like that the drainage following putting on the medicine was really "nasty". Nonetheless I explained that the weight Iodoflex breaks down or Iodosorb and that round does make it look really bad but it actually is not as bad as what it seems. In fact it is the normal course of things. 06/24/2020 upon evaluation today patient appears to be doing unfortunately about the same in regard to his heel. I do not think this is any worse. However its become increasingly clear that he has significant dementia in my opinion. We have had multiple instances going on here from the standpoint of trying to get him to take the oral antibiotic. Initially we ordered it for him he picked it up on 1 afternoon and by the next morning we called to make sure that he had gotten it he told us yes but that he had thrown it away. That he was not to take any additional medications. We then told him he needed to be taken this to get the infection under control he subsequently look for it only found the bottle never took the medication therefore I sent it in a second time for him. After sending this and it looks like he has not really taken it since that time he tells me that he took 7 pills at once although I do not think they were all of the same medication and then following this it made him sick I think these were all of his different medicines. He decided he was not to take any further medication after that point. Nonetheless I do believe he still is going require treatment for this infection the good news is he has a friend who is a very good friend that is with him here today to try to help with figuring out what to do and how going forward this should be  taken care of. 07/01/2020 upon evaluation today patient appears to be doing really about the same. His measurements are maybe slightly smaller than previous. Fortunately there is no signs of active infection which is great news. No fevers, chills, nausea, vomiting, or diarrhea. 07/08/2020 upon evaluation today patient appears to be doing better in regard to his wounds. He looks like he finally actually took the Levaquin which has helped with the infection. I think things are doing much better in that regard which is great news. No fevers, chills, nausea, vomiting, or diarrhea. Unfortunately he did have actual Kleenex/tissue paper over the wound on really not sure why his wife put that on there but nonetheless I think were ready to start  wrapping this week. 07/15/2020 on evaluation today patient appears to be doing about the same in regard to his wound. Unfortunately there does not appear to be any significant improvement. With that being said he also came in today with the heel of his wrap completely hanging out where the wound was exposed and just rubbing on the bedroom slipper that he has on today. With that being said this is obviously not ideal. I discussed that with the patient I really think that he is getting need to be more careful and conscientious about what is going on with his wrap. With that being said some of this is to know followed on his own that he actually has in my opinion significant dementia which inhibits his ability to be able to comply. He has a friend with him today who is trying to help out as best he can I am just not certain that Mr. Wardlow is actually able to do what needs to be done here. 07/22/20 upon evaluation today patient appears to be doing well with regard to his wound is measuring a little bit smaller. With that being said he did not have any dressings on whatsoever upon evaluation today it was just gauze wrapped Curlex over his leg. Subsequently the Coban  following this really was not the appropriate wrap and more importantly he had no dressing actually on the wound itself. Fortunately there does not appear to be any signs of active infection which is great news. Unfortunately my main concern here is that the patient needs to have appropriate dressings on in order to prevent infection and allow for continued and appropriate healing. 07/29/2020 upon evaluation today patient appears to be doing okay in regard to his wound. Unfortunately still do not have the Hydrofera Blue in place in fact it appears that he had a wet-to-dry dressing again applied to the heel even after our conversation with the home health supervisor last week where they told us that that is what the nurse had put in place and that we have been contacted to advise [place] that that was being done. We had never been contacted in any such regard prior to that being done. At that point we advised that until they get the [facility] in they could use a silver alginate dressing. Nonetheless again today the patient has a wet-to-dry dressing only on he is very macerated around the edges he is also very hyper granulated in regard to the wound bed both a direct result of inappropriate dressings being applied to this wound. That is a direct cause of issues going on with home health not following orders as prescribed by the clinic here. 08/05/2020 upon evaluation today patient appears to be doing excellent in regard to his wound. He has been tolerating the dressing changes without complication and overall very pleased with where things stand currently. With that being said the patient did not have the ABD pad in place and I really think he may actually be able to do better with XtraSorb even at this time. Nonetheless I think that if we can get this swelling Milhoan, Kline D. ([id number]) under control and then turned the edema lessens is weeping then that will be a dramatic improvement. We can work on  that currently. 08/19/2020 upon evaluation today patient appears to be doing excellent in regard to his heel ulcer. He has been tolerating the dressing changes without complication. I do feel like he is making good progress which is great news. There  does not appear to be any signs of significant infection at this point at all which is also excellent. Objective Constitutional Well-nourished and well-hydrated in no acute distress. Vitals Time Taken: 10:50 AM, Height: 71 in, Weight: 216 lbs, BMI: 30.1, Temperature: 98.1 F, Pulse: 66 bpm, Respiratory Rate: 18 breaths/min, Blood Pressure: 147/70 mmHg. Respiratory normal breathing without difficulty. Psychiatric this patient is able to make decisions and demonstrates good insight into disease process. Alert and Oriented x 3. pleasant and cooperative. General Notes: Patient's wound bed showed signs of good granulation epithelization at this point. There does not appear to be any evidence of infection which is great news and overall very pleased with where things stand. I do think the Hydrofera Blue is doing a great job I believe Lauraine Rinne is also needed to try to help control the amount of drainage that he is experiencing. Integumentary (Hair, Skin) Wound #1 status is Open. Original cause of wound was Pressure Injury. The date acquired was: 04/03/2020. The wound has been in treatment 11 weeks. The wound is located on the Right,Lateral Foot. The wound measures 3.5cm length x 2.5cm width x 0.2cm depth; 6.872cm^2 area and 1.374cm^3 volume. There is Fat Layer (Subcutaneous Tissue) exposed. There is no tunneling or undermining noted. There is a large amount of serosanguineous drainage noted. The wound margin is thickened. There is large (67-100%) red, hyper - granulation within the wound bed. There is no necrotic tissue within the wound bed. Assessment Active Problems ICD-10 Pressure ulcer of right heel, unstageable Repeated falls Long term (current)  use of anticoagulants Procedures Wound #1 Pre-procedure diagnosis of Wound #1 is a Pressure Ulcer located on the Right,Lateral Foot . There was a Three Layer Compression Therapy Procedure by Jonathan Poag, RN. Post procedure Diagnosis Wound #1: Same as Pre-Procedure Plan Follow-up Appointments: Return Appointment in 1 week. [name], [name] ([id number]) Home Health: [place] for wound care. May utilize formulary equivalent dressing for wound treatment orders unless otherwise specified. Home Health Nurse may visit PRN to address patient s wound care needs. - [facility] to change on Wednesdays and Fridays , patient to be seen in clinic on mondays Edema Control - Lymphedema / Segmental Compressive Device / Other: Elevate, Exercise Daily and Avoid Standing for Long Periods of Time. Elevate legs to the level of the heart and pump ankles as often as possible Elevate leg(s) parallel to the floor when sitting. Off-Loading: Open toe surgical shoe - right foot WOUND #1: - Foot Wound Laterality: Right, Lateral Cleanser: [place] Ancillary Kit - 15 Day Supply (Generic) 3 x Per Week/30 Days Discharge Instructions: Use supplies as instructed; Kit contains: (15) Saline Bullets; (15) 3x3 Gauze; 15 pr Gloves Cleanser: Normal Saline (Generic) 3 x Per Week/30 Days Discharge Instructions: Wash your hands with soap and water. Remove old dressing, discard into plastic bag and place into trash. Cleanse the wound with Normal Saline prior to applying a clean dressing using gauze sponges, not tissues or cotton balls. Do not scrub or use excessive force. Pat dry using gauze sponges, not tissue or cotton balls. Primary Dressing: Hydrofera Blue Ready Transfer Foam, 2.5x2.5 (in/in) (Generic) 3 x Per Week/30 Days Discharge Instructions: Apply Hydrofera Blue Ready to wound bed as directed Secondary Dressing: Xtrasorb Medium 4x5 (in/in) (Generic) 3 x Per Week/30 Days Discharge Instructions: Apply to wound as  directed. Do not cut. Compression Wrap: Profore Lite LF 3 Multilayer Compression Bandaging System (Generic) 3 x Per Week/30 Days Discharge Instructions: Apply 3 multi-layer wrap as prescribed. 1.  Would recommend currently that we continue with the [facility] followed by Lauraine Rinne I think that still the best way to go. 2. I am also going to recommend that we have the patient continue with the 3 layer compression wrap which I think is beneficial for him. 3. I would also recommend he continue to elevate his leg keeping pressure off of the area in question on the heel. Obviously I think this is important in order to prevent any additional and further breakdown. We will see patient back for reevaluation in 1 week here in the clinic. If anything worsens or changes patient will contact our office for additional recommendations. Electronic Signature(s) Signed: 08/19/2020 1:36:31 PM By: Jonathan Keeler PA-C Entered By: [name] on 08/19/2020 13:36:31 Sobiech, Jonathan Mull ([id number]) -------------------------------------------------------------------------------- SuperBill Details [name] Date of Service: 08/19/2020 Medical Record Number: [id number] Patient Account Number: [id number] Date of Birth/Sex: January 25, 1935 (86 y.o. M) Treating RN: [name] Primary Care Provider: Nobie Nguyen Other Clinician: Referring Provider: Nobie Nguyen Treating Provider/Extender: [name] in Treatment: 11 Diagnosis Coding ICD-10 Codes Code Description L89.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, unstageable R29.6 Repeated falls Z79.01 Long term (current) use of anticoagulants Facility Procedures CPT4 Code: [id number] Description: (Facility Use Only) [id number] 8031 - [place] RT LEG Modifier: Quantity: 1 Physician Procedures CPT4 Code: 0802233 Description: 61224 - WC PHYS LEVEL 3 - EST PT Modifier: Quantity: 1 CPT4 Code: Description: ICD-10 Diagnosis  Description L89.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, unstageable R29.6 Repeated falls Z79.01 Long term (current) use of anticoagulants Modifier: Quantity: Electronic Signature(s) Signed: 08/19/2020 1:36:44 PM By: Jonathan Keeler PA-C Previous Signature: 08/19/2020 1:00:54 PM Version By: [name] Entered By: [name] on 08/19/2020 13:36:43

## 2020-08-19 NOTE — Chronic Care Management (AMB) (Signed)
Chronic Care Management Pharmacy Note  08/19/2020 Name:  [name] MRN:  [id number] DOB:  Jul 15, 1934  Subjective: [name] is an 86 y.o. year old male who is a primary patient of [name], DO.  The CCM team was consulted for assistance with disease management and care coordination needs.    Engaged with caregiver by telephone for follow up visit in response to provider referral for pharmacy case management and/or care coordination services.   Consent to Services:  The patient was given information about Chronic Care Management services, agreed to services, and gave verbal consent prior to initiation of services.  Please see initial visit note for detailed documentation.   Patient Care Team: [name], DO as PCP - General (Family Medicine) Greg Cutter, [name] as [facility] Management (Licensed Clinical Social Worker) Hall Busing, Nobie Putnam, RN as Case Manager ([place]) Vella Raring, RPH-CPP as Pharmacist   Recent consult visits: Office Visit with Wound Care 4/18 Office Visit with Urology on 4/19  Hospital visits: None in previous 6 months  Objective:  Lab Results  Component Value Date   CREATININE 1.25 (H) 06/13/2020   CREATININE 0.98 08/07/2018   CREATININE 1.14 09/18/2017       Component Value Date/Time   CHOL 163 06/13/2020 1113   TRIG 100 06/13/2020 1113   HDL 37 (L) 06/13/2020 1113   CHOLHDL 4.4 06/13/2020 1113   VLDL 21 07/20/2016 0001   LDLCALC 106 (H) 06/13/2020 1113    Hepatic Function Latest Ref Rng & Units 06/13/2020 09/18/2017 09/08/2017  Total Protein 6.1 - 8.1 g/dL 6.5 7.5 7.8  Albumin 3.5 - 5.0 g/dL - 3.6 4.0  AST 10 - 35 U/L _0 ALT 9 - 46 U/L _1 Alk Phosphatase 38 - 126 U/L - 75 65  Total Bilirubin 0.2 - 1.2 mg/dL 0.6 0.9 0.8  Bilirubin, Direct 0.1 - 0.5 mg/dL - - 0.1    Social History   Tobacco Use  Smoking Status Former Smoker  . Quit date: 43  . Years since  quitting: 37.3  Smokeless Tobacco Former User   BP Readings from Last 3 Encounters:  08/06/20 (!) 162/76  06/26/20 (!) 160/74  06/25/20 140/84   Pulse Readings from Last 3 Encounters:  08/06/20 68  06/26/20 73  06/25/20 73   Wt Readings from Last 3 Encounters:  08/06/20 217 lb 6.4 oz (98.6 kg)  06/26/20 224 lb (101.6 kg)  06/25/20 222 lb 12.8 oz (101.1 kg)    Assessment: Review of patient past medical history, allergies, medications, health status, including review of consultants reports, laboratory and other test data, was performed as part of comprehensive evaluation and provision of chronic care management services.   SDOH:  (Social Determinants of Health) assessments and interventions performed: none   CCM Care Plan  No Known Allergies  Medications Reviewed Today    Reviewed by [name] on 08/08/20 at 1634  Med List Status: <None>  Medication Order Taking? Sig Documenting Provider Last Dose Status Informant  aspirin EC 81 MG tablet [id number] No Take 1 tablet (81 mg total) by mouth in the morning. (blood thinner) Karamalegos, Devonne Doughty, DO Taking Active   atorvastatin (LIPITOR) 40 MG tablet [id number] No Take 1 tablet (40 mg total) by mouth at bedtime. (Cholesterol) [name], DO Taking Active   clopidogrel (PLAVIX) 75 MG tablet [id number] No Take 1 tablet (75 mg total) by mouth in  the morning. (blood thinner) Karamalegos, Devonne Doughty, DO Taking Active   lisinopril (ZESTRIL) 20 MG tablet [id number] No Take 1 tablet (20 mg total) by mouth in the morning. (Blood pressure) Karamalegos, Devonne Doughty, DO Taking Active   terazosin (HYTRIN) 5 MG capsule [id number] No Take 1 capsule (5 mg total) by mouth at bedtime. (blood pressure, prostate) [name], DO Taking Active           Patient Active Problem List   Diagnosis Date Noted  . Cognitive deficit as late effect of cerebrovascular accident (CVA) 06/25/2020  . Essential hypertension 06/25/2020   . Imbalance 02/01/2018  . Humeral head fracture, left, closed, initial encounter 09/22/2017  . History of cerebrovascular accident (CVA) with residual deficit 09/09/2017  . CKD (chronic kidney disease), stage III ([place]) 09/01/2017  . Elevated PSA, between 10 and less than 20 ng/ml 07/22/2016  . Bilateral lower extremity edema 07/22/2016  . Abnormal glucose 07/15/2016  . BPH with obstruction/lower urinary tract symptoms 04/10/2016  . Benign hypertension with CKD (chronic kidney disease) stage III ([place]) 04/10/2016  . Memory loss 04/10/2016  . Gastric outlet obstruction 12/02/2014  . Generalized abdominal pain   . Benign fibroma of prostate 01/02/2011  . HLD (hyperlipidemia) 01/02/2011  . Obesity (BMI 30.0-34.9) 01/02/2011    Immunization History  Administered Date(s) Administered  . Fluad Quad(high Dose 65+) 12/14/2018  . Influenza, Seasonal, Injecte, Preservative Fre 02/07/2009  . Influenza-Unspecified 02/02/2002, 02/04/2004, 01/19/2007, 01/06/2008, 01/18/2010, 12/20/2010, 12/20/2014  . Pneumococcal Polysaccharide-23 02/27/2002, 08/31/2017  . Td 02/27/2002  . Tdap 09/08/2017    Conditions to be addressed/monitored: HTN and HLD  Care Plan : PharmD - Med Mgmt  Updates made by Vella Raring, RPH-CPP since 08/19/2020 12:00 AM    Problem: Disease Progression     Long-Range Goal: Disease Progression Prevented or Minimized   Start Date: 07/03/2020  Expected End Date: 10/01/2020  This Visit's Progress: On track  Recent Progress: On track  Priority: High  Note:   Current Barriers:  . Unable to self-administer medications as prescribed . Chronic Disease Management support and education needs related to memory loss and changes . Lack of blood pressure readings for clinical team  Pharmacist Clinical Goal(s):   Over the next 90 days, patient will achieve adherence to monitoring guidelines and medication adherence to achieve therapeutic efficacy through collaboration with PharmD  and provider.   Interventions: . 1:1 collaboration with [name], DO regarding development and update of comprehensive plan of care as evidenced by provider attestation and co-signature . Inter-disciplinary care team collaboration (see longitudinal plan of care) . Perform chart review o Patient seen for Office Visit with Wound Care on 4/18 o Patient seen for Office Visit with Urology on 4/19 - Prostate MRI scheduled . Today speak with patient's friend/caregiver, [name]. o [place] RN continuing to help patient with wound care/dressings at home o Reports patient currently at Wound Care appointment    Medication Adherence: . Caregiver reports patient continuing to take medications from pill pack as filled by [facility] RN also helping patient with managing medications . Reports has labeled pill pack with days of the week to aid with adherence and denies any missed doses. o Encourage caregiver to continue to label pill pack for patient  Hypertension: . Current treatment: o Lisinopril 20 mg QAM o Terazosin 5 mg QHS . Patient has a home wrist monitor . Have encouraged patient to monitor home blood pressure, keep log of  results and have record to review during next telephone outreach  Patient Goals/Self-Care Activities  Over the next 90 days, patient will:  - focus on medication adherence by using weekly pillbox with assistance from/as filled by friend, [name]  Follow Up Plan: Telephone follow up appointment with care management team member scheduled for: 6/6 at 10:15 am       Patient's preferred pharmacy is:  [facility] - [place] Buff, [place] [place] [address] Phone: [phone] Fax: [phone]  [facility] [address], [place] - [place] [address] [place] [address] Phone: [phone] Fax: [phone]  [facility] [place], [place] [address] [place] [address] Phone: [phone] Fax: [phone]  [place], [place], [place], [place] [address] Phone: [phone] Fax: [place], [place] [address] Phone: [phone] Fax: [place], [place]. [place] [address] Phone: [phone] Fax: [phone]  Uses pill box? Uses pill pack  Follow Up:  Caregiver agrees to Care Plan and Follow-up.  Plan: Telephone follow up appointment with care management team member scheduled for:  6/6 at 10:15 am  Harlow Asa, PharmD, [place], [place] [phone]

## 2020-08-19 NOTE — Progress Notes (Addendum)
CORLEY, MAFFEO ([id number]) Visit Report for 08/19/2020 Arrival Information Details [name], [name] Date of Service: 08/19/2020 11:15 AM Medical Record Number: [id number] Patient Account Number: [id number] Date of Birth/Sex: Mar 20, 1935 (85 y.o. M) Treating RN: Donnamarie Poag Primary Care Odysseus Cada: Nobie Putnam Other Clinician: Referring Chyla Schlender: Nobie Putnam Treating Cade Olberding/Extender: Skipper Cliche in Treatment: 11 Visit Information History Since Last Visit Added or deleted any medications: No Patient Arrived: Ambulatory Had a fall or experienced change in No Arrival Time: 10:49 activities of daily living that may affect Accompanied By: self risk of falls: Transfer Assistance: None Hospitalized since last visit: No Patient Identification Verified: Yes Pain Present Now: No Secondary Verification Process Completed: Yes Patient Has Alerts: Yes Patient Alerts: Not Diabetic Plavix Electronic Signature(s) Signed: 08/19/2020 5:04:44 PM By: Jeanine Luz Entered By: Jeanine Luz on 08/19/2020 10:49:25 Bigos, [name] ([id number]) -------------------------------------------------------------------------------- Clinic Level of Care Assessment Details [name] Date of Service: 08/19/2020 11:15 AM Medical Record Number: [id number] Patient Account Number: [id number] Date of Birth/Sex: 01/10/1935 (85 y.o. M) Treating RN: Donnamarie Poag Primary Care Jourden Delmont: Nobie Putnam Other Clinician: Referring Hayli Milligan: Nobie Putnam Treating Afomia Blackley/Extender: Skipper Cliche in Treatment: 11 Clinic Level of Care Assessment Items TOOL 1 Quantity Score '[]'  - Use when EandM and Procedure is performed on INITIAL visit 0 ASSESSMENTS - Nursing Assessment / Reassessment '[]'  - General Physical Exam (combine w/ comprehensive assessment (listed just below) when performed on new 0 pt. evals) '[]'  - 0 Comprehensive Assessment (HX, ROS, Risk  Assessments, Wounds Hx, etc.) ASSESSMENTS - Wound and Skin Assessment / Reassessment '[]'  - Dermatologic / Skin Assessment (not related to wound area) 0 ASSESSMENTS - Ostomy and/or Continence Assessment and Care '[]'  - Incontinence Assessment and Management 0 '[]'  - 0 Ostomy Care Assessment and Management (repouching, etc.) PROCESS - Coordination of Care '[]'  - Simple Patient / Family Education for ongoing care 0 '[]'  - 0 Complex (extensive) Patient / Family Education for ongoing care '[]'  - 0 Staff obtains Programmer, systems, Records, Test Results / Process Orders '[]'  - 0 Staff telephones HHA, Nursing Homes / Clarify orders / etc '[]'  - 0 Routine Transfer to another Facility (non-emergent condition) '[]'  - 0 Routine Hospital Admission (non-emergent condition) '[]'  - 0 New Admissions / Biomedical engineer / Ordering NPWT, Apligraf, etc. '[]'  - 0 Emergency Hospital Admission (emergent condition) PROCESS - Special Needs '[]'  - Pediatric / Minor Patient Management 0 '[]'  - 0 Isolation Patient Management '[]'  - 0 Hearing / Language / Visual special needs '[]'  - 0 Assessment of Community assistance (transportation, D/C planning, etc.) '[]'  - 0 Additional assistance / Altered mentation '[]'  - 0 Support Surface(s) Assessment (bed, cushion, seat, etc.) INTERVENTIONS - Miscellaneous '[]'  - External ear exam 0 '[]'  - 0 Patient Transfer (multiple staff / Civil Service fast streamer / Similar devices) '[]'  - 0 Simple Staple / Suture removal (25 or less) '[]'  - 0 Complex Staple / Suture removal (26 or more) '[]'  - 0 Hypo/Hyperglycemic Management (do not check if billed separately) '[]'  - 0 Ankle / Brachial Index (ABI) - do not check if billed separately Has the patient been seen at the hospital within the last three years: Yes Total Score: 0 Level Of Care: ____ [name] ([id number]) Electronic Signature(s) Signed: 08/19/2020 1:00:54 PM By: Donnamarie Poag Entered By: Donnamarie Poag on 08/19/2020 11:39:30 Agostini, Jafeth D.  ([id number]) -------------------------------------------------------------------------------- Compression Therapy Details [name] Date of Service: 08/19/2020 11:15 AM Medical Record Number: [id number] Patient Account Number: [id number] Date  of Birth/Sex: [birth year] (85 y.o. M) Treating RN: Donnamarie Poag Primary Care Dajuan Turnley: Nobie Putnam Other Clinician: Referring Keyontae Huckeby: Nobie Putnam Treating Leisel Pinette/Extender: Skipper Cliche in Treatment: 11 Compression Therapy Performed for Wound Assessment: Wound #1 Right,Lateral Foot Performed By: Junius Argyle, RN Compression Type: Three Layer Post Procedure Diagnosis Same as Pre-procedure Electronic Signature(s) Signed: 08/19/2020 1:00:54 PM By: Donnamarie Poag Entered By: Donnamarie Poag on 08/19/2020 11:37:54 Sanroman, [name] ([id number]) -------------------------------------------------------------------------------- Encounter Discharge Information Details [name] Date of Service: 08/19/2020 11:15 AM Medical Record Number: [id number] Patient Account Number: [id number] Date of Birth/Sex: [birth year] (85 y.o. M) Treating RN: Carlene Coria Primary Care Evolet Salminen: Nobie Putnam Other Clinician: Referring Marbella Markgraf: Nobie Putnam Treating Khayman Kirsch/Extender: Skipper Cliche in Treatment: 11 Encounter Discharge Information Items Discharge Condition: Stable Ambulatory Status: Ambulatory Discharge Destination: Home Transportation: Private Auto Accompanied By: self Schedule Follow-up Appointment: Yes Clinical Summary of Care: Electronic Signature(s) Signed: 08/19/2020 5:22:07 PM By: Carlene Coria RN Entered By: Carlene Coria on 08/19/2020 11:59:12 Calise, Isaah Keturah Barre ([id number]) -------------------------------------------------------------------------------- Lower Extremity Assessment Details [name] Date of Service: 08/19/2020 11:15 AM Medical Record Number:  [id number] Patient Account Number: [id number] Date of Birth/Sex: 03/19/1935 (85 y.o. M) Treating RN: Donnamarie Poag Primary Care Terasa Orsini: Nobie Putnam Other Clinician: Referring Biruk Troia: Nobie Putnam Treating Debraann Livingstone/Extender: Skipper Cliche in Treatment: 11 Electronic Signature(s) Signed: 08/19/2020 1:00:54 PM By: Donnamarie Poag Signed: 08/19/2020 5:04:44 PM By: Jeanine Luz Entered By: Jeanine Luz on 08/19/2020 11:03:59 Ungerer, Aimar DMarland Kitchen ([id number]) -------------------------------------------------------------------------------- Multi Wound Chart Details Patient Name: Jonathan Moros D. Date of Service: 08/19/2020 11:15 AM Medical Record Number: [id number] Patient Account Number: [id number] Date of Birth/Sex: 01-18-1935 (85 y.o. M) Treating RN: Donnamarie Poag Primary Care Evella Kasal: Nobie Putnam Other Clinician: Referring Larz Mark: Nobie Putnam Treating Orvil Faraone/Extender: Skipper Cliche in Treatment: 11 Vital Signs Height(in): 71 Pulse(bpm): 46 Weight(lbs): 216 Blood Pressure(mmHg): 147/70 Body Mass Index(BMI): 30 Temperature(F): 98.1 Respiratory Rate(breaths/min): 18 Photos: [N/A:N/A] Wound Location: Right, Lateral Foot N/A N/A Wounding Event: Pressure Injury N/A N/A Primary Etiology: Pressure Ulcer N/A N/A Comorbid History: Cataracts, Hypertension N/A N/A Date Acquired: 04/03/2020 N/A N/A Weeks of Treatment: 11 N/A N/A Wound Status: Open N/A N/A Measurements L x W x D (cm) 3.5x2.5x0.2 N/A N/A Area (cm) : 1.540 N/A N/A Volume (cm) : 1.374 N/A N/A % Reduction in Area: 69.40% N/A N/A % Reduction in Volume: 87.70% N/A N/A Classification: Unstageable/Unclassified N/A N/A Exudate Amount: Large N/A N/A Exudate Type: Serosanguineous N/A N/A Exudate Color: red, brown N/A N/A Wound Margin: Thickened N/A N/A Granulation Amount: Large (67-100%) N/A N/A Granulation Quality: Red, Hyper-granulation N/A N/A Necrotic Amount: None Present (0%) N/A  N/A Exposed Structures: Fat Layer (Subcutaneous Tissue): N/A N/A Yes Fascia: No Tendon: No Muscle: No Joint: No Bone: No Epithelialization: None N/A N/A Treatment Notes Electronic Signature(s) Signed: 08/19/2020 1:00:54 PM By: Donnamarie Poag Entered By: Donnamarie Poag on 08/19/2020 11:37:30 Kovar, [name] ([id number]) -------------------------------------------------------------------------------- Multi-Disciplinary Care Plan Details [name] Date of Service: 08/19/2020 11:15 AM Medical Record Number: [id number] Patient Account Number: [id number] Date of Birth/Sex: [birth year] (85 y.o. M) Treating RN: Donnamarie Poag Primary Care Angle Karel: Nobie Putnam Other Clinician: Referring Haydee Jabbour: Nobie Putnam Treating Hester Joslin/Extender: Skipper Cliche in Treatment: 11 Active Inactive Wound/Skin Impairment Nursing Diagnoses: Knowledge deficit related to ulceration/compromised skin integrity Goals: Patient/caregiver will verbalize understanding of skin care regimen Date Initiated: 06/03/2020 Date Inactivated: 08/20/2020 Target Resolution Date: 08/31/2020 Goal Status: Met Ulcer/skin breakdown will have a volume reduction of 30%  by week 4 Date Initiated: 06/03/2020 Date Inactivated: 07/01/2020 Target Resolution Date: 07/01/2020 Goal Status: Met Ulcer/skin breakdown will have a volume reduction of 50% by week 8 Date Initiated: 07/01/2020 Date Inactivated: 08/05/2020 Target Resolution Date: 08/01/2020 Goal Status: Met Ulcer/skin breakdown will have a volume reduction of 80% by week 12 Date Initiated: 07/01/2020 Target Resolution Date: 08/31/2020 Goal Status: Active Ulcer/skin breakdown will heal within 14 weeks Date Initiated: 07/01/2020 Target Resolution Date: 10/01/2020 Goal Status: Active Interventions: Assess patient/caregiver ability to obtain necessary supplies Assess patient/caregiver ability to perform ulcer/skin care regimen upon admission and as  needed Assess ulceration(s) every visit Notes: Electronic Signature(s) Signed: 08/20/2020 4:36:45 PM By: Donnamarie Poag Previous Signature: 08/19/2020 1:00:54 PM Version By: Donnamarie Poag Entered By: Donnamarie Poag on 08/20/2020 16:36:45 Ozga, Davine D. ([id number]) -------------------------------------------------------------------------------- Pain Assessment Details Patient Name: Jonathan Moros D. Date of Service: 08/19/2020 11:15 AM Medical Record Number: [id number] Patient Account Number: [id number] Date of Birth/Sex: 02/07/1935 (85 y.o. M) Treating RN: Donnamarie Poag Primary Care Juliannah Ohmann: Nobie Putnam Other Clinician: Referring Madysun Thall: Nobie Putnam Treating Jenene Kauffmann/Extender: Skipper Cliche in Treatment: 11 Active Problems Location of Pain Severity and Description of Pain Patient Has Paino No Site Locations Rate the pain. Current Pain Level: 0 Pain Management and Medication Current Pain Management: Electronic Signature(s) Signed: 08/19/2020 1:00:54 PM By: Donnamarie Poag Signed: 08/19/2020 5:04:44 PM By: Jeanine Luz Entered By: Jeanine Luz on 08/19/2020 10:49:35 Servidio, [name] ([id number]) -------------------------------------------------------------------------------- Patient/Caregiver Education Details [name] Date of Service: 08/19/2020 11:15 AM Medical Record Number: [id number] Patient Account Number: [id number] Date of Birth/Gender: [birth year] (85 y.o. M) Treating RN: Donnamarie Poag Primary Care Physician: Nobie Putnam Other Clinician: Referring Physician: Nobie Putnam Treating Physician/Extender: Skipper Cliche in Treatment: 11 Education Assessment Education Provided To: Patient Education Topics Provided Basic Hygiene: Wound/Skin Impairment: Electronic Signature(s) Signed: 08/19/2020 1:00:54 PM By: Donnamarie Poag Entered By: Donnamarie Poag on 08/19/2020 11:40:13 Laughner, Dareon DMarland Kitchen  ([id number]) -------------------------------------------------------------------------------- Wound Assessment Details [name] Date of Service: 08/19/2020 11:15 AM Medical Record Number: [id number] Patient Account Number: [id number] Date of Birth/Sex: [birth year] (85 y.o. M) Treating RN: Donnamarie Poag Primary Care Josecarlos Harriott: Nobie Putnam Other Clinician: Referring Maurice Fotheringham: Nobie Putnam Treating Galya Dunnigan/Extender: Skipper Cliche in Treatment: 11 Wound Status Wound Number: 1 Primary Etiology: Pressure Ulcer Wound Location: Right, Lateral Foot Wound Status: Open Wounding Event: Pressure Injury Comorbid History: Cataracts, Hypertension Date Acquired: 04/03/2020 Weeks Of Treatment: 11 Clustered Wound: No Photos Wound Measurements Length: (cm) 3.5 Width: (cm) 2.5 Depth: (cm) 0.2 Area: (cm) 6.872 Volume: (cm) 1.374 % Reduction in Area: 69.4% % Reduction in Volume: 87.7% Epithelialization: None Tunneling: No Undermining: No Wound Description Classification: Unstageable/Unclassified Wound Margin: Thickened Exudate Amount: Large Exudate Type: Serosanguineous Exudate Color: red, brown Foul Odor After Cleansing: No Slough/Fibrino No Wound Bed Granulation Amount: Large (67-100%) Exposed Structure Granulation Quality: Red, Hyper-granulation Fascia Exposed: No Necrotic Amount: None Present (0%) Fat Layer (Subcutaneous Tissue) Exposed: Yes Tendon Exposed: No Muscle Exposed: No Joint Exposed: No Bone Exposed: No Treatment Notes Wound #1 (Foot) Wound Laterality: Right, Lateral Cleanser [place] Ancillary Kit - 15 Day Supply Discharge Instruction: Use supplies as instructed; Kit contains: (15) Saline Bullets; (15) 3x3 Gauze; 15 pr Gloves Normal Saline Kotlyar, Ares D. ([id number]) Discharge Instruction: Wash your hands with soap and water. Remove old dressing, discard into plastic bag and place into trash. Cleanse the wound with Normal Saline  prior to applying a clean dressing using gauze sponges, not tissues or cotton balls. Do not  scrub or use excessive force. Pat dry using gauze sponges, not tissue or cotton balls. Peri-Wound Care Topical Primary Dressing Hydrofera Blue Ready Transfer Foam, 2.5x2.5 (in/in) Discharge Instruction: Apply Hydrofera Blue Ready to wound bed as directed Secondary Dressing Xtrasorb Medium 4x5 (in/in) Discharge Instruction: Apply to wound as directed. Do not cut. Secured With Compression Wrap Profore Lite LF 3 Multilayer Compression Bandaging System Discharge Instruction: Apply 3 multi-layer wrap as prescribed. Compression Stockings Add-Ons Electronic Signature(s) Signed: 08/19/2020 1:00:54 PM By: Donnamarie Poag Signed: 08/19/2020 5:04:44 PM By: Jeanine Luz Entered By: Jeanine Luz on 08/19/2020 11:03:45 Levitt, [name] ([id number]) -------------------------------------------------------------------------------- Vitals Details Patient Name: Jonathan Moros D. Date of Service: 08/19/2020 11:15 AM Medical Record Number: [id number] Patient Account Number: [id number] Date of Birth/Sex: 1934/06/16 (85 y.o. M) Treating RN: Donnamarie Poag Primary Care Kasaundra Fahrney: Nobie Putnam Other Clinician: Referring Nyonna Hargrove: Nobie Putnam Treating Lennie Dunnigan/Extender: Skipper Cliche in Treatment: 11 Vital Signs Time Taken: 10:50 Temperature (F): 98.1 Height (in): 71 Pulse (bpm): 66 Weight (lbs): 216 Respiratory Rate (breaths/min): 18 Body Mass Index (BMI): 30.1 Blood Pressure (mmHg): 147/70 Reference Range: 80 - 120 mg / dl Electronic Signature(s) Signed: 08/19/2020 5:04:44 PM By: Jeanine Luz Entered By: Jeanine Luz on 08/19/2020 10:52:53

## 2020-08-21 ENCOUNTER — Ambulatory Visit (INDEPENDENT_AMBULATORY_CARE_PROVIDER_SITE_OTHER): Payer: Medicare Other | Admitting: Licensed Clinical Social Worker

## 2020-08-21 DIAGNOSIS — I10 Essential (primary) hypertension: Secondary | ICD-10-CM | POA: Diagnosis not present

## 2020-08-21 DIAGNOSIS — E785 Hyperlipidemia, unspecified: Secondary | ICD-10-CM | POA: Diagnosis not present

## 2020-08-21 DIAGNOSIS — R300 Dysuria: Secondary | ICD-10-CM | POA: Diagnosis not present

## 2020-08-21 DIAGNOSIS — I129 Hypertensive chronic kidney disease with stage 1 through stage 4 chronic kidney disease, or unspecified chronic kidney disease: Secondary | ICD-10-CM | POA: Diagnosis not present

## 2020-08-21 DIAGNOSIS — L89616 Pressure-induced deep tissue damage of right heel: Secondary | ICD-10-CM | POA: Diagnosis not present

## 2020-08-21 DIAGNOSIS — Z7189 Other specified counseling: Secondary | ICD-10-CM

## 2020-08-21 DIAGNOSIS — N183 Chronic kidney disease, stage 3 unspecified: Secondary | ICD-10-CM | POA: Diagnosis not present

## 2020-08-21 DIAGNOSIS — I69315 Cognitive social or emotional deficit following cerebral infarction: Secondary | ICD-10-CM | POA: Diagnosis not present

## 2020-08-21 NOTE — Patient Instructions (Signed)
Visit Information  Goals Addressed              This Visit's Progress     Patient Stated   .  Obtain Coping Skills to assist with Chronic Health Conditions (pt-stated)        Timeframe:  Long-Range Goal Priority:  Medium Start Date:   07/09/20                   Expected End Date: 12/18/20                   Follow Up Date- 11/19/20  Patient Goals/Self-Care Activities: Over the next 90 days . Attends all scheduled provider appointments . Calls provider office for new concerns or questions . Continue practicing gratitude thinking to promote positive mood       Patient verbalizes understanding of instructions provided today.  Telephone follow up appointment with care management team member scheduled for: 11/19/20  Christa See, MSW, [email or web address] Phone [phone] 10:14 AM

## 2020-08-21 NOTE — Chronic Care Management (AMB) (Signed)
Chronic Care Management    Clinical Social Work Note  08/21/2020 Name: [name] MRN: [id number] DOB: May 16, 1934  [name] is a 86 y.o. year old male who is a primary care patient of [name], DO. The CCM team was consulted to assist the patient with chronic disease management and/or care coordination needs related to: Level of Care Concerns.   Engaged with patient by telephone for follow up visit in response to provider referral for social work chronic care management and care coordination services.   Consent to Services:  The patient was given information about Chronic Care Management services, agreed to services, and gave verbal consent prior to initiation of services.  Please see initial visit note for detailed documentation.   Patient agreed to services and consent obtained.   Assessment: Patient is engaged in conversation, continues to maintain positive progress with care plan goals. His wound is healing well and he continues to receive in home support from home health. See Care Plan below for interventions and patient self-care actives. Recent life changes /stressors: Management of health conditions related to wound Recommendation: Patient may benefit from, and is in agreement to continue following through with medical appointments, prescribed medications, and utilizing healthy coping skills.  Follow up Plan: Patient would like continued follow-up.  CCM [name] will follow up with patient on 11/19/20. Patient will call office if needed prior to next encounter.  SDOH (Social Determinants of Health) assessments and interventions performed:    Advanced Directives Status: Not addressed in this encounter.  CCM Care Plan  No Known Allergies  Outpatient Encounter Medications as of 08/21/2020  Medication Sig  . aspirin EC 81 MG tablet Take 1 tablet (81 mg total) by mouth in the morning. (blood thinner)  . atorvastatin (LIPITOR) 40 MG tablet Take 1 tablet (40 mg total)  by mouth at bedtime. (Cholesterol)  . clopidogrel (PLAVIX) 75 MG tablet Take 1 tablet (75 mg total) by mouth in the morning. (blood thinner)  . lisinopril (ZESTRIL) 20 MG tablet Take 1 tablet (20 mg total) by mouth in the morning. (Blood pressure)  . terazosin (HYTRIN) 5 MG capsule Take 1 capsule (5 mg total) by mouth at bedtime. (blood pressure, prostate)   No facility-administered encounter medications on file as of 08/21/2020.    Patient Active Problem List   Diagnosis Date Noted  . Cognitive deficit as late effect of cerebrovascular accident (CVA) 06/25/2020  . Essential hypertension 06/25/2020  . Imbalance 02/01/2018  . Humeral head fracture, left, closed, initial encounter 09/22/2017  . History of cerebrovascular accident (CVA) with residual deficit 09/09/2017  . CKD (chronic kidney disease), stage III ([place]) 09/01/2017  . Elevated PSA, between 10 and less than 20 ng/ml 07/22/2016  . Bilateral lower extremity edema 07/22/2016  . Abnormal glucose 07/15/2016  . BPH with obstruction/lower urinary tract symptoms 04/10/2016  . Benign hypertension with CKD (chronic kidney disease) stage III ([place]) 04/10/2016  . Memory loss 04/10/2016  . Gastric outlet obstruction 12/02/2014  . Generalized abdominal pain   . Benign fibroma of prostate 01/02/2011  . HLD (hyperlipidemia) 01/02/2011  . Obesity (BMI 30.0-34.9) 01/02/2011    Conditions to be addressed/monitored: Stress; Level of care concerns  Care Plan : General Social Work (Adult)  Updates made by Rebekah Chesterfield, [name] since 08/21/2020 12:00 AM    Problem: Quality of Life (General Plan of Care)     Long-Range Goal: Quality of Life Maintained   Start Date: 06/19/2020  This Visit's Progress: On  track  Priority: Medium  Note:   Timeframe:  Long-Range Goal Priority:  Medium Start Date:   07/09/20                   Expected End Date: 12/18/20                   Follow Up Date- 11/19/20  Current Bar[place]:  . ADL IADL  limitations . Limited access to caregiver . Inability to perform ADL's independently . Inability to perform IADL's independently  Clinical Goals: Patient will work with CCM [name] to address needs related to management of chronic medical conditions and psychosocial stressors that negatively impacts health mental and physical health conditions  Clinical Interventions:  . Assessment of needs and barriers of treatment discussed-Patient reports that he is feeling great. States wound on his right foot is "pretty close" to being fully healed. Pt has been compliant with attending scheduled appointments and taking prescribed medications . Home Health nurse is scheduled to visit home today to tend to wound. She visits 2 x a week . CCM [name] completed SDOH screens to assess for any basic needs/safety concerns. Patient drives and is able to go grocery shopping for the household. Denies any needs at this time . Pt continues to utilize strong support system, including spouse, best friend, Ronalee Belts, and attending church. Pt is very spiritual and prays often to promote relaxation and positive mood. He has plans on spending time on best friend's boat, once his foot has healed appropriately.  . Collaboration with [name], DO regarding development and update of comprehensive plan of care as evidenced by provider attestation and co-signature . Inter-disciplinary care team collaboration (see longitudinal plan of care) . Other interventions provided:Mindfulness or Relaxation Training, Active listening / Reflection utilized , and Emotional Supportive Provided Patient Goals/Self-Care Activities: Over the next 90 days . Attends all scheduled provider appointments . Calls provider office for new concerns or questions . Continue practicing gratitude thinking to promote positive mood     Christa See, MSW, [place] [facility] Management [email or web address] Phone [phone] 10:11 AM

## 2020-08-23 DIAGNOSIS — N183 Chronic kidney disease, stage 3 unspecified: Secondary | ICD-10-CM | POA: Diagnosis not present

## 2020-08-23 DIAGNOSIS — L89616 Pressure-induced deep tissue damage of right heel: Secondary | ICD-10-CM | POA: Diagnosis not present

## 2020-08-23 DIAGNOSIS — R300 Dysuria: Secondary | ICD-10-CM | POA: Diagnosis not present

## 2020-08-23 DIAGNOSIS — I69315 Cognitive social or emotional deficit following cerebral infarction: Secondary | ICD-10-CM | POA: Diagnosis not present

## 2020-08-23 DIAGNOSIS — E785 Hyperlipidemia, unspecified: Secondary | ICD-10-CM | POA: Diagnosis not present

## 2020-08-23 DIAGNOSIS — I129 Hypertensive chronic kidney disease with stage 1 through stage 4 chronic kidney disease, or unspecified chronic kidney disease: Secondary | ICD-10-CM | POA: Diagnosis not present

## 2020-08-26 ENCOUNTER — Other Ambulatory Visit: Payer: Self-pay

## 2020-08-26 ENCOUNTER — Encounter: Payer: Medicare Other | Admitting: Physician Assistant

## 2020-08-26 DIAGNOSIS — L89890 Pressure ulcer of other site, unstageable: Secondary | ICD-10-CM | POA: Diagnosis not present

## 2020-08-26 DIAGNOSIS — Z7901 Long term (current) use of anticoagulants: Secondary | ICD-10-CM | POA: Diagnosis not present

## 2020-08-26 DIAGNOSIS — R296 Repeated falls: Secondary | ICD-10-CM | POA: Diagnosis not present

## 2020-08-26 DIAGNOSIS — L89610 Pressure ulcer of right heel, unstageable: Secondary | ICD-10-CM | POA: Diagnosis not present

## 2020-08-26 NOTE — Progress Notes (Addendum)
KYAL, ARTS ([id number]) Visit Report for 08/26/2020 Arrival Information Details [name], [name] Date of Service: 08/26/2020 10:30 AM Medical Record Number: [id number] Patient Account Number: [id number] Date of Birth/Sex: Aug 21, 1934 (85 y.o. M) Treating RN: [name] Primary Care Rafiq Bucklin: Nobie Putnam Other Clinician: Referring Delane Stalling: Nobie Putnam Treating Lebert Lovern/Extender: Skipper Cliche in Treatment: 12 Visit Information History Since Last Visit Has Dressing in Place as Prescribed: Yes Patient Arrived: Ambulatory Has Compression in Place as Prescribed: Yes Arrival Time: 10:37 Pain Present Now: No Accompanied By: wife in lobby Transfer Assistance: None Patient Identification Verified: Yes Secondary Verification Process Completed: Yes Patient Requires Transmission-Based Precautions: No Patient Has Alerts: Yes Patient Alerts: Not Diabetic Plavix Electronic Signature(s) Signed: 08/27/2020 10:18:25 AM By: Gretta Cool, BSN, RN, CWS, Kim RN, BSN Entered By: Gretta Cool, BSN, RN, CWS, Kim on 08/26/2020 10:37:57 [name], [name] ([id number]) -------------------------------------------------------------------------------- Clinic Level of Care Assessment Details [name] Date of Service: 08/26/2020 10:30 AM Medical Record Number: [id number] Patient Account Number: [id number] Date of Birth/Sex: [birth year] (85 y.o. M) Treating RN: Donnamarie Poag Primary Care Whitni Pasquini: Nobie Putnam Other Clinician: Referring Olimpia Tinch: Nobie Putnam Treating Malarie Tappen/Extender: Skipper Cliche in Treatment: 12 Clinic Level of Care Assessment Items TOOL 1 Quantity Score '[]'  - Use when EandM and Procedure is performed on INITIAL visit 0 ASSESSMENTS - Nursing Assessment / Reassessment '[]'  - General Physical Exam (combine w/ comprehensive assessment (listed just below) when performed on new 0 pt. evals) '[]'  - 0 Comprehensive Assessment (HX, ROS, Risk  Assessments, Wounds Hx, etc.) ASSESSMENTS - Wound and Skin Assessment / Reassessment '[]'  - Dermatologic / Skin Assessment (not related to wound area) 0 ASSESSMENTS - Ostomy and/or Continence Assessment and Care '[]'  - Incontinence Assessment and Management 0 '[]'  - 0 Ostomy Care Assessment and Management (repouching, etc.) PROCESS - Coordination of Care '[]'  - Simple Patient / Family Education for ongoing care 0 '[]'  - 0 Complex (extensive) Patient / Family Education for ongoing care '[]'  - 0 Staff obtains Programmer, systems, Records, Test Results / Process Orders '[]'  - 0 Staff telephones HHA, Nursing Homes / Clarify orders / etc '[]'  - 0 Routine Transfer to another Facility (non-emergent condition) '[]'  - 0 Routine Hospital Admission (non-emergent condition) '[]'  - 0 New Admissions / Biomedical engineer / Ordering NPWT, Apligraf, etc. '[]'  - 0 Emergency Hospital Admission (emergent condition) PROCESS - Special Needs '[]'  - Pediatric / Minor Patient Management 0 '[]'  - 0 Isolation Patient Management '[]'  - 0 Hearing / Language / Visual special needs '[]'  - 0 Assessment of Community assistance (transportation, D/C planning, etc.) '[]'  - 0 Additional assistance / Altered mentation '[]'  - 0 Support Surface(s) Assessment (bed, cushion, seat, etc.) INTERVENTIONS - Miscellaneous '[]'  - External ear exam 0 '[]'  - 0 Patient Transfer (multiple staff / Civil Service fast streamer / Similar devices) '[]'  - 0 Simple Staple / Suture removal (25 or less) '[]'  - 0 Complex Staple / Suture removal (26 or more) '[]'  - 0 Hypo/Hyperglycemic Management (do not check if billed separately) '[]'  - 0 Ankle / Brachial Index (ABI) - do not check if billed separately Has the patient been seen at the hospital within the last three years: Yes Total Score: 0 Level Of Care: ____ [name] ([id number]) Electronic Signature(s) Signed: 08/26/2020 5:00:28 PM By: Donnamarie Poag Entered By: Donnamarie Poag on 08/26/2020 11:05:22 [name], [name]  ([id number]) -------------------------------------------------------------------------------- Compression Therapy Details Patient Name: Jonathan Moros D. Date of Service: 08/26/2020 10:30 AM Medical Record Number: [id number] Patient Account Number: [id number] Date  of Birth/Sex: 10-May-1934 (85 y.o. M) Treating RN: Donnamarie Poag Primary Care Kaisy Severino: Nobie Putnam Other Clinician: Referring Eliyah Bazzi: Nobie Putnam Treating Cesar Alf/Extender: Skipper Cliche in Treatment: 12 Compression Therapy Performed for Wound Assessment: Wound #1 Right,Lateral Foot Performed By: Junius Argyle, RN Compression Type: Three Layer Post Procedure Diagnosis Same as Pre-procedure Electronic Signature(s) Signed: 08/26/2020 5:00:28 PM By: Donnamarie Poag Entered By: Donnamarie Poag on 08/26/2020 11:02:46 [name], [name] ([id number]) -------------------------------------------------------------------------------- Encounter Discharge Information Details [name] Date of Service: 08/26/2020 10:30 AM Medical Record Number: [id number] Patient Account Number: [id number] Date of Birth/Sex: 12/14/1934 (85 y.o. M) Treating RN: Donnamarie Poag Primary Care Aminata Buffalo: Nobie Putnam Other Clinician: Referring Camila Maita: Nobie Putnam Treating Collene Massimino/Extender: Skipper Cliche in Treatment: 12 Encounter Discharge Information Items Discharge Condition: Stable Ambulatory Status: Ambulatory Discharge Destination: Home Transportation: Private Auto Accompanied By: self Schedule Follow-up Appointment: Yes Clinical Summary of Care: Electronic Signature(s) Signed: 08/26/2020 5:00:28 PM By: Donnamarie Poag Entered By: Donnamarie Poag on 08/26/2020 11:16:15 [name], [name] ([id number]) -------------------------------------------------------------------------------- Lower Extremity Assessment Details [name] Date of Service: 08/26/2020 10:30 AM Medical Record Number:  [id number] Patient Account Number: [id number] Date of Birth/Sex: 1934/05/28 (85 y.o. M) Treating RN: [name] Primary Care Lanell Dubie: Nobie Putnam Other Clinician: Referring Molleigh Huot: Nobie Putnam Treating Mehreen Azizi/Extender: Skipper Cliche in Treatment: 12 Edema Assessment Assessed: [Left: No] Patrice Paradise: No] [Left: Edema] [Right: :] Calf Left: Right: Point of Measurement: 31 cm From Medial Instep 38 cm Ankle Left: Right: Point of Measurement: 12 cm From Medial Instep 29 cm Vascular Assessment Pulses: Dorsalis Pedis Palpable: [Right:Yes] Electronic Signature(s) Signed: 08/27/2020 10:18:25 AM By: Gretta Cool, BSN, RN, CWS, Kim RN, BSN Entered By: Gretta Cool, BSN, RN, CWS, Kim on 08/26/2020 10:47:46 [name], [name] ([id number]) -------------------------------------------------------------------------------- Multi Wound Chart Details [name] Date of Service: 08/26/2020 10:30 AM Medical Record Number: [id number] Patient Account Number: [id number] Date of Birth/Sex: [birth year] (85 y.o. M) Treating RN: Donnamarie Poag Primary Care Jakaylee Sasaki: Nobie Putnam Other Clinician: Referring Lamica Mccart: Nobie Putnam Treating Cielle Aguila/Extender: Skipper Cliche in Treatment: 12 Vital Signs Height(in): 71 Pulse(bpm): 62 Weight(lbs): 216 Blood Pressure(mmHg): 136/70 Body Mass Index(BMI): 30 Temperature(F): 97.5 Respiratory Rate(breaths/min): 18 Photos: [N/A:N/A] Wound Location: Right, Lateral Foot N/A N/A Wounding Event: Pressure Injury N/A N/A Primary Etiology: Pressure Ulcer N/A N/A Comorbid History: Cataracts, Hypertension N/A N/A Date Acquired: 04/03/2020 N/A N/A Weeks of Treatment: 12 N/A N/A Wound Status: Open N/A N/A Measurements L x W x D (cm) 3x2.5x0.1 N/A N/A Area (cm) : 5.89 N/A N/A Volume (cm) : 0.589 N/A N/A % Reduction in Area: 73.70% N/A N/A % Reduction in Volume: 94.70% N/A N/A Classification: Unstageable/Unclassified N/A  N/A Exudate Amount: Large N/A N/A Exudate Type: Serosanguineous N/A N/A Exudate Color: red, brown N/A N/A Wound Margin: Thickened N/A N/A Granulation Amount: Large (67-100%) N/A N/A Granulation Quality: Red, Hyper-granulation N/A N/A Necrotic Amount: Small (1-33%) N/A N/A Exposed Structures: Fat Layer (Subcutaneous Tissue): N/A N/A Yes Fascia: No Tendon: No Muscle: No Joint: No Bone: No Epithelialization: None N/A N/A Treatment Notes Electronic Signature(s) Signed: 08/26/2020 5:00:28 PM By: Donnamarie Poag Entered By: Donnamarie Poag on 08/26/2020 11:02:12 [name], [name] ([id number]) -------------------------------------------------------------------------------- [place] Details [name] Date of Service: 08/26/2020 10:30 AM Medical Record Number: [id number] Patient Account Number: [id number] Date of Birth/Sex: 1934/12/16 (85 y.o. M) Treating RN: Donnamarie Poag Primary Care Kaisyn Reinhold: Nobie Putnam Other Clinician: Referring Yurika Pereda: Nobie Putnam Treating Der Gagliano/Extender: Skipper Cliche in Treatment: 12 Active  Inactive Wound/Skin Impairment Nursing Diagnoses: Knowledge deficit related to ulceration/compromised skin integrity Goals: Patient/caregiver will verbalize understanding of skin care regimen Date Initiated: 06/03/2020 Date Inactivated: 08/20/2020 Target Resolution Date: 08/31/2020 Goal Status: Met Ulcer/skin breakdown will have a volume reduction of 30% by week 4 Date Initiated: 06/03/2020 Date Inactivated: 07/01/2020 Target Resolution Date: 07/01/2020 Goal Status: Met Ulcer/skin breakdown will have a volume reduction of 50% by week 8 Date Initiated: 07/01/2020 Date Inactivated: 08/05/2020 Target Resolution Date: 08/01/2020 Goal Status: Met Ulcer/skin breakdown will have a volume reduction of 80% by week 12 Date Initiated: 07/01/2020 Target Resolution Date: 08/31/2020 Goal Status: Active Ulcer/skin breakdown will heal  within 14 weeks Date Initiated: 07/01/2020 Target Resolution Date: 10/01/2020 Goal Status: Active Interventions: Assess patient/caregiver ability to obtain necessary supplies Assess patient/caregiver ability to perform ulcer/skin care regimen upon admission and as needed Assess ulceration(s) every visit Notes: Electronic Signature(s) Signed: 08/26/2020 5:00:28 PM By: Donnamarie Poag Entered By: Donnamarie Poag on 08/26/2020 11:02:00 [name], [name] ([id number]) -------------------------------------------------------------------------------- Pain Assessment Details [name] Date of Service: 08/26/2020 10:30 AM Medical Record Number: [id number] Patient Account Number: [id number] Date of Birth/Sex: 20-Mar-1935 (85 y.o. M) Treating RN: [name] Primary Care Durand Wittmeyer: Nobie Putnam Other Clinician: Referring Verlene Glantz: Nobie Putnam Treating Ilham Roughton/Extender: Skipper Cliche in Treatment: 12 Active Problems Location of Pain Severity and Description of Pain Patient Has Paino No Site Locations Pain Management and Medication Current Pain Management: Notes PAtient denies pain at this time. Electronic Signature(s) Signed: 08/27/2020 10:18:25 AM By: Gretta Cool, BSN, RN, CWS, Kim RN, BSN Entered By: Gretta Cool, BSN, RN, CWS, Kim on 08/26/2020 10:39:23 [name] ([id number]) -------------------------------------------------------------------------------- Patient/Caregiver Education Details [name] Date of Service: 08/26/2020 10:30 AM Medical Record Number: [id number] Patient Account Number: [id number] Date of Birth/Gender: February 22, 1935 (85 y.o. M) Treating RN: Donnamarie Poag Primary Care Physician: Nobie Putnam Other Clinician: Referring Physician: Nobie Putnam Treating Physician/Extender: Skipper Cliche in Treatment: 12 Education Assessment Education Provided To: Patient Education Topics Provided Basic Hygiene: Wound/Skin  Impairment: Electronic Signature(s) Signed: 08/26/2020 5:00:28 PM By: Donnamarie Poag Entered By: Donnamarie Poag on 08/26/2020 11:03:00 [name], [name] ([id number]) -------------------------------------------------------------------------------- Wound Assessment Details [name] Date of Service: 08/26/2020 10:30 AM Medical Record Number: [id number] Patient Account Number: [id number] Date of Birth/Sex: [birth year] (85 y.o. M) Treating RN: [name] Primary Care Arron Mcnaught: Nobie Putnam Other Clinician: Referring Nikko Quast: Nobie Putnam Treating [name]/Extender: Skipper Cliche in Treatment: 12 Wound Status Wound Number: 1 Primary Etiology: Pressure Ulcer Wound Location: Right, Lateral Foot Wound Status: Open Wounding Event: Pressure Injury Comorbid History: Cataracts, Hypertension Date Acquired: 04/03/2020 Weeks Of Treatment: 12 Clustered Wound: No Photos Wound Measurements Length: (cm) 3 Width: (cm) 2.5 Depth: (cm) 0.1 Area: (cm) 5.89 Volume: (cm) 0.589 % Reduction in Area: 73.7% % Reduction in Volume: 94.7% Epithelialization: None Tunneling: No Undermining: No Wound Description Classification: Unstageable/Unclassified Wound Margin: Thickened Exudate Amount: Large Exudate Type: Serosanguineous Exudate Color: red, brown Foul Odor After Cleansing: No Slough/Fibrino Yes Wound Bed Granulation Amount: Large (67-100%) Exposed Structure Granulation Quality: Red, Hyper-granulation Fascia Exposed: No Necrotic Amount: Small (1-33%) Fat Layer (Subcutaneous Tissue) Exposed: Yes Necrotic Quality: Adherent Slough Tendon Exposed: No Muscle Exposed: No Joint Exposed: No Bone Exposed: No Treatment Notes Wound #1 (Foot) Wound Laterality: Right, Lateral Cleanser [place] Ancillary Kit - 15 Day Supply Discharge Instruction: Use supplies as instructed; Kit contains: (15) Saline Bullets; (15) 3x3 Gauze; 15 pr Gloves Normal Saline DEMTRIUS, ROUNDS  ([id number]) Discharge Instruction: Wash your  hands with soap and water. Remove old dressing, discard into plastic bag and place into trash. Cleanse the wound with Normal Saline prior to applying a clean dressing using gauze sponges, not tissues or cotton balls. Do not scrub or use excessive force. Pat dry using gauze sponges, not tissue or cotton balls. Peri-Wound Care Topical Primary Dressing Hydrofera Blue Ready Transfer Foam, 2.5x2.5 (in/in) Discharge Instruction: Apply Hydrofera Blue Ready to wound bed as directed Secondary Dressing Xtrasorb Medium 4x5 (in/in) Discharge Instruction: Apply to wound as directed. Do not cut. Secured With Compression Wrap Profore Lite LF 3 Multilayer Compression Bandaging System Discharge Instruction: Apply 3 multi-layer wrap as prescribed. Compression Stockings Environmental education officer) Signed: 08/27/2020 10:18:25 AM By: Gretta Cool, BSN, RN, CWS, Kim RN, BSN Entered By: Gretta Cool, BSN, RN, CWS, Kim on 08/26/2020 10:46:42 [name] ([id number]) -------------------------------------------------------------------------------- Vitals Details [name] Date of Service: 08/26/2020 10:30 AM Medical Record Number: [id number] Patient Account Number: [id number] Date of Birth/Sex: 10/20/1934 (85 y.o. M) Treating RN: [name] Primary Care Makail Watling: Nobie Putnam Other Clinician: Referring Jemimah Cressy: Nobie Putnam Treating Song Garris/Extender: Skipper Cliche in Treatment: 12 Vital Signs Time Taken: 10:38 Temperature (F): 97.5 Height (in): 71 Pulse (bpm): 62 Weight (lbs): 216 Respiratory Rate (breaths/min): 18 Body Mass Index (BMI): 30.1 Blood Pressure (mmHg): 136/70 Reference Range: 80 - 120 mg / dl Electronic Signature(s) Signed: 08/27/2020 10:18:25 AM By: Gretta Cool, BSN, RN, CWS, Kim RN, BSN Entered By: Gretta Cool, BSN, RN, CWS, Kim on 08/26/2020 10:39:01

## 2020-08-26 NOTE — Progress Notes (Addendum)
HAITHAM, DOLINSKY ([id number]) Visit Report for 08/26/2020 Chief Complaint Document Details [name], [name] Date of Service: 08/26/2020 10:30 AM Medical Record Number: [id number] Patient Account Number: [id number] Date of Birth/Sex: 11-02-1934 (85 y.o. M) Treating RN: Donnamarie Poag Primary Care Provider: Nobie Putnam Other Clinician: Referring Provider: Nobie Putnam Treating Provider/Extender: Skipper Cliche in Treatment: 12 Information Obtained from: Patient Chief Complaint Right heel ulcer Electronic Signature(s) Signed: 08/26/2020 10:33:53 AM By: Worthy Keeler PA-C Entered By: Worthy Keeler on 08/26/2020 10:33:53 Talarico, [name] ([id number]) -------------------------------------------------------------------------------- HPI Details Patient Name: [name] Date of Service: 08/26/2020 10:30 AM Medical Record Number: [id number] Patient Account Number: [id number] Date of Birth/Sex: 1935/01/22 (85 y.o. M) Treating RN: Donnamarie Poag Primary Care Provider: Nobie Putnam Other Clinician: Referring Provider: Nobie Putnam Treating Provider/Extender: Skipper Cliche in Treatment: 12 History of Present Illness HPI Description: 06/03/2020 upon evaluation today patient actually presents for initial inspection here in our clinic after having gone to urgent care this morning where they were concerned about infection as well as a significant "softball sized wound with eschar" over the heel. With that being said the patient fortunately does not have an area that largely does have an area of eschar which is draining around the edges really not stable and I think we are getting to remove the eschar so we try to clean up the surface of the wound. Fortunately there is no evidence of active infection at this time. I do believe that he is going require some debridement both manually today with scissors and forceps as well as enzymatic/chemically with the  medications. I really feel like that he has a good chance to get this to heal if he keep pressure off of it we did discuss that again today. He does note this has been present for about 2 months. He is not taking any of his current medications that we did put him on the list he tells me that he only occasionally takes them which is obviously not doing anything for him. He does have a cane but he does not use it and his ABI was 1.14 on the right. 06/10/2020 upon evaluation today patient appears to be doing well with regard to his wound all things considered. He still has a lot of necrotic tissue in the central portion of the wound. I think the Iodosorb is still the right thing to do and again the biggest issue is he quit using it as he and his wife felt like that the drainage following putting on the medicine was really "nasty". Nonetheless I explained that the weight Iodoflex breaks down or Iodosorb and that round does make it look really bad but it actually is not as bad as what it seems. In fact it is the normal course of things. 06/24/2020 upon evaluation today patient appears to be doing unfortunately about the same in regard to his heel. I do not think this is any worse. However its become increasingly clear that he has significant dementia in my opinion. We have had multiple instances going on here from the standpoint of trying to get him to take the oral antibiotic. Initially we ordered it for him he picked it up on 1 afternoon and by the next morning we called to make sure that he had gotten it he told us yes but that he had thrown it away. That he was not to take any additional medications. We then told him he needed to be taken this  to get the infection under control he subsequently look for it only found the bottle never took the medication therefore I sent it in a second time for him. After sending this and it looks like he has not really taken it since that time he tells me that he took 7  pills at once although I do not think they were all of the same medication and then following this it made him sick I think these were all of his different medicines. He decided he was not to take any further medication after that point. Nonetheless I do believe he still is going require treatment for this infection the good news is he has a friend who is a very good friend that is with him here today to try to help with figuring out what to do and how going forward this should be taken care of. 07/01/2020 upon evaluation today patient appears to be doing really about the same. His measurements are maybe slightly smaller than previous. Fortunately there is no signs of active infection which is great news. No fevers, chills, nausea, vomiting, or diarrhea. 07/08/2020 upon evaluation today patient appears to be doing better in regard to his wounds. He looks like he finally actually took the Levaquin which has helped with the infection. I think things are doing much better in that regard which is great news. No fevers, chills, nausea, vomiting, or diarrhea. Unfortunately he did have actual Kleenex/tissue paper over the wound on really not sure why his wife put that on there but nonetheless I think were ready to start wrapping this week. 07/15/2020 on evaluation today patient appears to be doing about the same in regard to his wound. Unfortunately there does not appear to be any significant improvement. With that being said he also came in today with the heel of his wrap completely hanging out where the wound was exposed and just rubbing on the bedroom slipper that he has on today. With that being said this is obviously not ideal. I discussed that with the patient I really think that he is getting need to be more careful and conscientious about what is going on with his wrap. With that being said some of this is to know followed on his own that he actually has in my opinion significant dementia which inhibits  his ability to be able to comply. He has a friend with him today who is trying to help out as best he can I am just not certain that [name] is actually able to do what needs to be done here. 07/22/20 upon evaluation today patient appears to be doing well with regard to his wound is measuring a little bit smaller. With that being said he did not have any dressings on whatsoever upon evaluation today it was just gauze wrapped Curlex over his leg. Subsequently the Coban following this really was not the appropriate wrap and more importantly he had no dressing actually on the wound itself. Fortunately there does not appear to be any signs of active infection which is great news. Unfortunately my main concern here is that the patient needs to have appropriate dressings on in order to prevent infection and allow for continued and appropriate healing. 07/29/2020 upon evaluation today patient appears to be doing okay in regard to his wound. Unfortunately still do not have the Hydrofera Blue in place in fact it appears that he had a wet-to-dry dressing again applied to the heel even after our conversation with the home  health supervisor last week where they told us that that is what the nurse had put in place and that we have been contacted to advise [place] that that was being done. We had never been contacted in any such regard prior to that being done. At that point we advised that until they get the [facility] in they could use a silver alginate dressing. Nonetheless again today the patient has a wet-to-dry dressing only on he is very macerated around the edges he is also very hyper granulated in regard to the wound bed both a direct result of inappropriate dressings being applied to this wound. That is a direct cause of issues going on with home health not following orders as prescribed by the clinic here. 08/05/2020 upon evaluation today patient appears to be doing excellent in regard to his wound. He has  been tolerating the dressing changes without complication and overall very pleased with where things stand currently. With that being said the patient did not have the ABD pad in place and I really think he may actually be able to do better with XtraSorb even at this time. Nonetheless I think that if we can get this swelling under control and then turned the edema lessens is weeping then that will be a dramatic improvement. We can work on that currently. 08/19/2020 upon evaluation today patient appears to be doing excellent in regard to his heel ulcer. He has been tolerating the dressing changes without complication. I do feel like he is making good progress which is great news. There does not appear to be any signs of significant infection at this point at all which is also excellent. HANK, WALLING ([id number]) 08/26/2020 upon evaluation today patient appears to be doing excellent in regard to his heel ulcer. He is draining a lot but I think the big issue here is that he probably needs to be elevating his legs a lot more to help with controlling some of this edema. Fortunately there is no evidence of active infection at this time which is great news. No fevers, chills, nausea, vomiting, or diarrhea. Electronic Signature(s) Signed: 08/26/2020 11:09:28 AM By: Worthy Keeler PA-C Entered By: Worthy Keeler on 08/26/2020 11:09:28 [name] ([id number]) -------------------------------------------------------------------------------- Physical Exam Details Patient Name: [name] Date of Service: 08/26/2020 10:30 AM Medical Record Number: [id number] Patient Account Number: [id number] Date of Birth/Sex: 1934/10/09 (85 y.o. M) Treating RN: Donnamarie Poag Primary Care Provider: Nobie Putnam Other Clinician: Referring Provider: Nobie Putnam Treating Provider/Extender: Skipper Cliche in Treatment: 43 Constitutional Well-nourished and well-hydrated in no acute  distress. Respiratory normal breathing without difficulty. Psychiatric this patient is able to make decisions and demonstrates good insight into disease process. Alert and Oriented x 3. pleasant and cooperative. Notes Upon inspection patient's wound bed showed signs of good granulation epithelization at this point. I think that he is making good progress but this is just going very slowly. I discussed with the patient that I do think that he needs to try and elevate his legs is much as possible to help with drainage and edema control. Obviously the more he can do this to better. Electronic Signature(s) Signed: 08/26/2020 11:10:26 AM By: Worthy Keeler PA-C Entered By: Worthy Keeler on 08/26/2020 11:10:25 Bohman, [name] ([id number]) -------------------------------------------------------------------------------- Physician Orders Details Patient Name: [name] Date of Service: 08/26/2020 10:30 AM Medical Record Number: [id number] Patient Account Number: [id number] Date of Birth/Sex: 01/08/1935 (85 y.o. M) Treating RN: Donnamarie Poag Primary  Care Provider: Nobie Putnam Other Clinician: Referring Provider: Nobie Putnam Treating Provider/Extender: Skipper Cliche in Treatment: 12 Verbal / Phone Orders: No Diagnosis Coding ICD-10 Coding Code Description L89.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, unstageable R29.6 Repeated falls Z79.01 Long term (current) use of anticoagulants Follow-up Appointments o Return Appointment in 1 week. [place] for wound care. May utilize formulary equivalent dressing for wound treatment orders unless otherwise specified. Home Health Nurse may visit PRN to address patientos wound care needs. - [facility] to change on Wednesdays and Fridays , patient to be seen in clinic on [place] Orders/Instructions: - Please [place] OF  HEALING Edema Control - Lymphedema / Segmental Compressive Device / Other o Optional: One layer of unna paste to top of compression wrap (to act as an anchor). o Elevate, Exercise Daily and Avoid Standing for Long Periods of Time. - AVOID WALKING MORE THAN NEEDED o Elevate legs to the level of the heart and pump ankles as often as possible o Elevate leg(s) parallel to the floor when sitting. Off-Loading o Open toe surgical shoe - right foot Wound Treatment Wound #1 - Foot Wound Laterality: Right, Lateral Cleanser: [place] Ancillary Kit - 15 Day Supply (Generic) 3 x Per Week/30 Days Discharge Instructions: Use supplies as instructed; Kit contains: (15) Saline Bullets; (15) 3x3 Gauze; 15 pr Gloves Cleanser: Normal Saline (Generic) 3 x Per Week/30 Days Discharge Instructions: Wash your hands with soap and water. Remove old dressing, discard into plastic bag and place into trash. Cleanse the wound with Normal Saline prior to applying a clean dressing using gauze sponges, not tissues or cotton balls. Do not scrub or use excessive force. Pat dry using gauze sponges, not tissue or cotton balls. Primary Dressing: Hydrofera Blue Ready Transfer Foam, 2.5x2.5 (in/in) (Generic) 3 x Per Week/30 Days Discharge Instructions: Apply Hydrofera Blue Ready to wound bed as directed Secondary Dressing: Xtrasorb Medium 4x5 (in/in) (Generic) 3 x Per Week/30 Days Discharge Instructions: Apply to wound as directed. Do not cut. Compression Wrap: Profore Lite LF 3 Multilayer Compression Bandaging System (Generic) 3 x Per Week/30 Days Discharge Instructions: Apply 3 multi-layer wrap as prescribed. Electronic Signature(s) Signed: 08/30/2020 10:34:15 AM By: Donnamarie Poag Signed: 09/17/2020 5:49:27 PM By: Worthy Keeler PA-C Previous Signature: 08/26/2020 5:00:28 PM Version By: Donnamarie Poag Previous Signature: 08/26/2020 6:51:50 PM Version By: Worthy Keeler PA-C Entered By: Donnamarie Poag on 08/30/2020 10:31:35 Chalupa,  [name] ([id number]) -------------------------------------------------------------------------------- Problem List Details Patient Name: [name] Date of Service: 08/26/2020 10:30 AM Medical Record Number: [id number] Patient Account Number: [id number] Date of Birth/Sex: 25-Jun-1934 (85 y.o. M) Treating RN: Donnamarie Poag Primary Care Provider: Nobie Putnam Other Clinician: Referring Provider: Nobie Putnam Treating Provider/Extender: Skipper Cliche in Treatment: 12 Active Problems ICD-10 Encounter Code Description Active Date MDM Diagnosis L89.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, unstageable 06/03/2020 No Yes R29.6 Repeated falls 06/03/2020 No Yes Z79.01 Long term (current) use of anticoagulants 06/03/2020 No Yes Inactive Problems Resolved Problems Electronic Signature(s) Signed: 08/26/2020 10:33:47 AM By: Worthy Keeler PA-C Entered By: Worthy Keeler on 08/26/2020 10:33:47 Gregg, [name] ([id number]) -------------------------------------------------------------------------------- Progress Note Details Patient Name: [name] Date of Service: 08/26/2020 10:30 AM Medical Record Number: [id number] Patient Account Number: [id number] Date of Birth/Sex: 11/28/1934 (85 y.o. M) Treating RN: Donnamarie Poag Primary Care Provider: Nobie Putnam Other Clinician: Referring Provider: Nobie Putnam Treating Provider/Extender: Skipper Cliche  in Treatment: 12 Subjective Chief Complaint Information obtained from Patient Right heel ulcer History of Present Illness (HPI) 06/03/2020 upon evaluation today patient actually presents for initial inspection here in our clinic after having gone to urgent care this morning where they were concerned about infection as well as a significant "softball sized wound with eschar" over the heel. With that being said the patient fortunately does not have an area that largely does have an area of eschar which is draining around  the edges really not stable and I think we are getting to remove the eschar so we try to clean up the surface of the wound. Fortunately there is no evidence of active infection at this time. I do believe that he is going require some debridement both manually today with scissors and forceps as well as enzymatic/chemically with the medications. I really feel like that he has a good chance to get this to heal if he keep pressure off of it we did discuss that again today. He does note this has been present for about 2 months. He is not taking any of his current medications that we did put him on the list he tells me that he only occasionally takes them which is obviously not doing anything for him. He does have a cane but he does not use it and his ABI was 1.14 on the right. 06/10/2020 upon evaluation today patient appears to be doing well with regard to his wound all things considered. He still has a lot of necrotic tissue in the central portion of the wound. I think the Iodosorb is still the right thing to do and again the biggest issue is he quit using it as he and his wife felt like that the drainage following putting on the medicine was really "nasty". Nonetheless I explained that the weight Iodoflex breaks down or Iodosorb and that round does make it look really bad but it actually is not as bad as what it seems. In fact it is the normal course of things. 06/24/2020 upon evaluation today patient appears to be doing unfortunately about the same in regard to his heel. I do not think this is any worse. However its become increasingly clear that he has significant dementia in my opinion. We have had multiple instances going on here from the standpoint of trying to get him to take the oral antibiotic. Initially we ordered it for him he picked it up on 1 afternoon and by the next morning we called to make sure that he had gotten it he told us yes but that he had thrown it away. That he was not to take any  additional medications. We then told him he needed to be taken this to get the infection under control he subsequently look for it only found the bottle never took the medication therefore I sent it in a second time for him. After sending this and it looks like he has not really taken it since that time he tells me that he took 7 pills at once although I do not think they were all of the same medication and then following this it made him sick I think these were all of his different medicines. He decided he was not to take any further medication after that point. Nonetheless I do believe he still is going require treatment for this infection the good news is he has a friend who is a very good friend that is with him here today to try  to help with figuring out what to do and how going forward this should be taken care of. 07/01/2020 upon evaluation today patient appears to be doing really about the same. His measurements are maybe slightly smaller than previous. Fortunately there is no signs of active infection which is great news. No fevers, chills, nausea, vomiting, or diarrhea. 07/08/2020 upon evaluation today patient appears to be doing better in regard to his wounds. He looks like he finally actually took the Levaquin which has helped with the infection. I think things are doing much better in that regard which is great news. No fevers, chills, nausea, vomiting, or diarrhea. Unfortunately he did have actual Kleenex/tissue paper over the wound on really not sure why his wife put that on there but nonetheless I think were ready to start wrapping this week. 07/15/2020 on evaluation today patient appears to be doing about the same in regard to his wound. Unfortunately there does not appear to be any significant improvement. With that being said he also came in today with the heel of his wrap completely hanging out where the wound was exposed and just rubbing on the bedroom slipper that he has on today.  With that being said this is obviously not ideal. I discussed that with the patient I really think that he is getting need to be more careful and conscientious about what is going on with his wrap. With that being said some of this is to know followed on his own that he actually has in my opinion significant dementia which inhibits his ability to be able to comply. He has a friend with him today who is trying to help out as best he can I am just not certain that Mr. Lacuesta is actually able to do what needs to be done here. 07/22/20 upon evaluation today patient appears to be doing well with regard to his wound is measuring a little bit smaller. With that being said he did not have any dressings on whatsoever upon evaluation today it was just gauze wrapped Curlex over his leg. Subsequently the Coban following this really was not the appropriate wrap and more importantly he had no dressing actually on the wound itself. Fortunately there does not appear to be any signs of active infection which is great news. Unfortunately my main concern here is that the patient needs to have appropriate dressings on in order to prevent infection and allow for continued and appropriate healing. 07/29/2020 upon evaluation today patient appears to be doing okay in regard to his wound. Unfortunately still do not have the Hydrofera Blue in place in fact it appears that he had a wet-to-dry dressing again applied to the heel even after our conversation with the home health supervisor last week where they told us that that is what the nurse had put in place and that we have been contacted to advise [place] that that was being done. We had never been contacted in any such regard prior to that being done. At that point we advised that until they get the [facility] in they could use a silver alginate dressing. Nonetheless again today the patient has a wet-to-dry dressing only on he is very macerated around the edges he is also very  hyper granulated in regard to the wound bed both a direct result of inappropriate dressings being applied to this wound. That is a direct cause of issues going on with home health not following orders as prescribed by the clinic here. 08/05/2020 upon evaluation  today patient appears to be doing excellent in regard to his wound. He has been tolerating the dressing changes without complication and overall very pleased with where things stand currently. With that being said the patient did not have the ABD pad in place and I really think he may actually be able to do better with XtraSorb even at this time. Nonetheless I think that if we can get this swelling [place], [place] D. ([id number]) under control and then turned the edema lessens is weeping then that will be a dramatic improvement. We can work on that currently. 08/19/2020 upon evaluation today patient appears to be doing excellent in regard to his heel ulcer. He has been tolerating the dressing changes without complication. I do feel like he is making good progress which is great news. There does not appear to be any signs of significant infection at this point at all which is also excellent. 08/26/2020 upon evaluation today patient appears to be doing excellent in regard to his heel ulcer. He is draining a lot but I think the big issue here is that he probably needs to be elevating his legs a lot more to help with controlling some of this edema. Fortunately there is no evidence of active infection at this time which is great news. No fevers, chills, nausea, vomiting, or diarrhea. Objective Constitutional Well-nourished and well-hydrated in no acute distress. Vitals Time Taken: 10:38 AM, Height: 71 in, Weight: 216 lbs, BMI: 30.1, Temperature: 97.5 F, Pulse: 62 bpm, Respiratory Rate: 18 breaths/min, Blood Pressure: 136/70 mmHg. Respiratory normal breathing without difficulty. Psychiatric this patient is able to make decisions and demonstrates good  insight into disease process. Alert and Oriented x 3. pleasant and cooperative. General Notes: Upon inspection patient's wound bed showed signs of good granulation epithelization at this point. I think that he is making good progress but this is just going very slowly. I discussed with the patient that I do think that he needs to try and elevate his legs is much as possible to help with drainage and edema control. Obviously the more he can do this to better. Integumentary (Hair, Skin) Wound #1 status is Open. Original cause of wound was Pressure Injury. The date acquired was: 04/03/2020. The wound has been in treatment 12 weeks. The wound is located on the Right,Lateral Foot. The wound measures 3cm length x 2.5cm width x 0.1cm depth; 5.89cm^2 area and 0.589cm^3 volume. There is Fat Layer (Subcutaneous Tissue) exposed. There is no tunneling or undermining noted. There is a large amount of serosanguineous drainage noted. The wound margin is thickened. There is large (67-100%) red, hyper - granulation within the wound bed. There is a small (1-33%) amount of necrotic tissue within the wound bed including Adherent Slough. Assessment Active Problems ICD-10 Pressure ulcer of right heel, unstageable Repeated falls Long term (current) use of anticoagulants Procedures Wound #1 Pre-procedure diagnosis of Wound #1 is a Pressure Ulcer located on the Right,Lateral Foot . There was a Three Layer Compression Therapy Procedure by Donnamarie Poag, RN. Post procedure Diagnosis Wound #1: Same as Pre-Procedure KYSEN, [place] ([id number]) Plan Follow-up Appointments: Return Appointment in 1 week. Home Health: [facility] for wound care. May utilize formulary equivalent dressing for wound treatment orders unless otherwise specified. Home Health Nurse may visit PRN to address patient s wound care needs. - [facility] to change on Wednesdays and Fridays , patient to be seen in clinic on [place] Orders/Instructions: - Please [facility] PERFORMANCE OF HEALING Edema Control - Lymphedema / Segmental Compressive Device / Other: Elevate, Exercise Daily and Avoid Standing for Long Periods of Time. - AVOID WALKING MORE THAN NEEDED Elevate legs to the level of the heart and pump ankles as often as possible Elevate leg(s) parallel to the floor when sitting. Off-Loading: Open toe surgical shoe - right foot WOUND #1: - Foot Wound Laterality: Right, Lateral Cleanser: [place] Ancillary Kit - 15 Day Supply (Generic) 3 x Per Week/30 Days Discharge Instructions: Use supplies as instructed; Kit contains: (15) Saline Bullets; (15) 3x3 Gauze; 15 pr Gloves Cleanser: Normal Saline (Generic) 3 x Per Week/30 Days Discharge Instructions: Wash your hands with soap and water. Remove old dressing, discard into plastic bag and place into trash. Cleanse the wound with Normal Saline prior to applying a clean dressing using gauze sponges, not tissues or cotton balls. Do not scrub or use excessive force. Pat dry using gauze sponges, not tissue or cotton balls. Primary Dressing: Hydrofera Blue Ready Transfer Foam, 2.5x2.5 (in/in) (Generic) 3 x Per Week/30 Days Discharge Instructions: Apply Hydrofera Blue Ready to wound bed as directed Secondary Dressing: Xtrasorb Medium 4x5 (in/in) (Generic) 3 x Per Week/30 Days Discharge Instructions: Apply to wound as directed. Do not cut. Compression Wrap: Profore Lite LF 3 Multilayer Compression Bandaging System (Generic) 3 x Per Week/30 Days Discharge Instructions: Apply 3 multi-layer wrap as prescribed. 1. Would recommend currently that we going continue with the wound care measures as before and the patient is in agreement with plan. This includes the use of the the [facility] dressing which I think is doing a good job. 2. I am also can recommend XtraSorb be centered in the area where the wound is and I think this is  good to continue as well. 3. I am also can recommend the patient continue to monitor for any signs of worsening infection. Obviously if anything occurs he should let me know for now we will need to use a 3 layer compression wrap to help control edema. We will see patient back for reevaluation in 1 week here in the clinic. If anything worsens or changes patient will contact our office for additional recommendations. Electronic Signature(s) Signed: 08/26/2020 11:11:19 AM By: Worthy Keeler PA-C Entered By: Worthy Keeler on 08/26/2020 11:11:19 Ashbaugh, [name] ([id number]) -------------------------------------------------------------------------------- SuperBill Details Patient Name: [name] Date of Service: 08/26/2020 Medical Record Number: [id number] Patient Account Number: [id number] Date of Birth/Sex: 04-09-1935 (86 y.o. M) Treating RN: Donnamarie Poag Primary Care Provider: Nobie Putnam Other Clinician: Referring Provider: Nobie Putnam Treating Provider/Extender: Skipper Cliche in Treatment: 12 Diagnosis Coding ICD-10 Codes Code Description L89.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, unstageable R29.6 Repeated falls Z79.01 Long term (current) use of anticoagulants Facility Procedures CPT4 Code: [id number] Description: (Facility Use Only) [phone] - [place] RT LEG Modifier: Quantity: 1 Physician Procedures CPT4 Code: [id number] Description: 70350 - WC PHYS LEVEL 3 - EST PT Modifier: Quantity: 1 CPT4 Code: Description: ICD-10 Diagnosis Description L89.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, unstageable R29.6 Repeated falls Z79.01 Long term (current) use of anticoagulants Modifier: Quantity: Electronic Signature(s) Signed: 08/26/2020 11:11:29 AM By: Worthy Keeler PA-C Entered By: Worthy Keeler on 08/26/2020 11:11:28

## 2020-08-28 ENCOUNTER — Other Ambulatory Visit: Payer: Self-pay

## 2020-08-28 ENCOUNTER — Ambulatory Visit
Admission: RE | Admit: 2020-08-28 | Discharge: 2020-08-28 | Disposition: A | Discharge status: Home / Self Care | Payer: Medicare Other | Source: Ambulatory Visit | Attending: Urology | Admitting: Urology

## 2020-08-28 DIAGNOSIS — L89616 Pressure-induced deep tissue damage of right heel: Secondary | ICD-10-CM | POA: Diagnosis not present

## 2020-08-28 DIAGNOSIS — I129 Hypertensive chronic kidney disease with stage 1 through stage 4 chronic kidney disease, or unspecified chronic kidney disease: Secondary | ICD-10-CM | POA: Diagnosis not present

## 2020-08-28 DIAGNOSIS — R972 Elevated prostate specific antigen [PSA]: Secondary | ICD-10-CM | POA: Insufficient documentation

## 2020-08-28 DIAGNOSIS — N320 Bladder-neck obstruction: Secondary | ICD-10-CM | POA: Diagnosis not present

## 2020-08-28 DIAGNOSIS — K4020 Bilateral inguinal hernia, without obstruction or gangrene, not specified as recurrent: Secondary | ICD-10-CM | POA: Diagnosis not present

## 2020-08-28 DIAGNOSIS — L89612 Pressure ulcer of right heel, stage 2: Secondary | ICD-10-CM | POA: Diagnosis not present

## 2020-08-28 DIAGNOSIS — I69315 Cognitive social or emotional deficit following cerebral infarction: Secondary | ICD-10-CM | POA: Diagnosis not present

## 2020-08-28 DIAGNOSIS — R3989 Other symptoms and signs involving the genitourinary system: Secondary | ICD-10-CM | POA: Insufficient documentation

## 2020-08-28 DIAGNOSIS — R300 Dysuria: Secondary | ICD-10-CM | POA: Diagnosis not present

## 2020-08-28 DIAGNOSIS — N183 Chronic kidney disease, stage 3 unspecified: Secondary | ICD-10-CM | POA: Diagnosis not present

## 2020-08-28 DIAGNOSIS — E785 Hyperlipidemia, unspecified: Secondary | ICD-10-CM | POA: Diagnosis not present

## 2020-08-28 MED ORDER — GADOBUTROL 1 MMOL/ML IV SOLN
9.0000 mL | Freq: Once | INTRAVENOUS | Status: AC | PRN
  Administered 2020-08-28: 9 mL via INTRAVENOUS

## 2020-08-29 NOTE — Progress Notes (Deleted)
08/30/2020 11:30 AM   [name] Jul 09, 1934 [id number]  Referring provider: [name], DO [address] [place],  [place] [address]  No chief complaint on file.  Urological history: 1. Elevated PSA - PSA Trend PSA, Total 0 - 4 ng/mL 2.34   2012   Component     Latest Ref Rng & Units 07/20/2016  PSA, Total     <=4.0 ng/mL 11.8 (H)   Component     Latest Ref Rng & Units 10/01/2016 04/09/2017 10/04/2017 11/08/2018  Prostate Specific Ag, Serum     0.0 - 4.0 ng/mL 10.6 (H) 11.1 (H) 17.9 (H) 17.4 (H)   Component     Latest Ref Rng & Units 06/13/2020  PSA     < OR = 4.0 ng/mL 24.34 (H)   Advised to undergo a prostate MRI in 10/2018 but patient refused  2. BPH with LU TS - I PSS 21/5 - PVR 0 mL - managed with terazosin 4 mg daily   HPI: [name] is a 86 y.o. male who presents today to get his prostate MRI scheduled with his friend from church, [name].    PMH: Past Medical History:  Diagnosis Date  . Bowel obstruction ([place])   . BPH (benign prostatic hypertrophy)   . Gastric outlet obstruction   . High cholesterol   . Hyperchloremia   . Hypertension   . Pancreatitis     Surgical History: Past Surgical History:  Procedure Laterality Date  . ESOPHAGOGASTRODUODENOSCOPY N/A 12/03/2014   Procedure: ESOPHAGOGASTRODUODENOSCOPY (EGD);  Surgeon: Hulen Luster, MD;  Location[facility] END[facility]COPY;  Service: Endoscopy;  Laterality: N/A;    Home Medications:  Allergies as of 08/30/2020   No Known Allergies     Medication List       Accurate as of Aug 29, 2020 11:30 AM. If you have any questions, ask your nurse or doctor.        aspirin EC 81 MG tablet Take 1 tablet (81 mg total) by mouth in the morning. (blood thinner)   atorvastatin 40 MG tablet Commonly known as: LIPITOR Take 1 tablet (40 mg total) by mouth at bedtime. (Cholesterol)   clopidogrel 75 MG tablet Commonly known as: PLAVIX Take 1 tablet (75 mg total) by mouth in the morning. (blood thinner)    lisinopril 20 MG tablet Commonly known as: ZESTRIL Take 1 tablet (20 mg total) by mouth in the morning. (Blood pressure)   terazosin 5 MG capsule Commonly known as: HYTRIN Take 1 capsule (5 mg total) by mouth at bedtime. (blood pressure, prostate)       Allergies: No Known Allergies  Family History: Family History  Problem Relation Age of Onset  . Heart disease Mother   . Heart disease Father   . Heart disease Brother     Social History:  reports that he quit smoking about 37 years ago. He has quit using smokeless tobacco. He reports that he does not drink alcohol and does not use drugs.  For pertinent review of systems please refer to history of present illness  Physical Exam: There were no vitals taken for this visit.  Constitutional:  Well nourished. Alert and oriented, No acute distress. HEENT: [place] AT, mask in place.  Trachea midline Cardiovascular: No clubbing, cyanosis, or edema. Respiratory: Normal respiratory effort, no increased work of breathing. Neurologic: Grossly intact, no focal deficits, moving all 4 extremities. Psychiatric: Normal mood and affect.   Laboratory Data: No new labs since last  visit   Pertinent Imaging CLINICAL DATA:  Elevated PSA.  No prior biopsy.  EXAM: MR PROSTATE WITHOUT AND WITH CONTRAST  TECHNIQUE: Multiplanar multisequence MRI images were obtained of the pelvis centered about the prostate. Pre and post contrast images were obtained.  CONTRAST:  32mL GADAVIST GADOBUTROL 1 MMOL/ML IV SOLN  COMPARISON:  Abdominopelvic CT 09/08/2017  FINDINGS: Prostate: Demonstrates moderate central gland enlargement and heterogeneity, consistent with benign prostatic hyperplasia. No dominant central gland nodule.  The peripheral zone is mildly compressed with nonspecific diffuse heterogeneous T2 signal. No areas of masslike T2 hypointensity, restricted diffusion, or early post-contrast enhancement.  Volume: 4.9 x 4.6 x 5.0 cm  (volume = 59 cm^3)  Transcapsular spread:  Absent  Seminal vesicle involvement: Absent  Neurovascular bundle involvement: Absent  Pelvic adenopathy: A right external iliac node measures 11 mm on 37/60 versus 8 mm on 09/08/2017.  Bone metastasis: Absent  Other findings: Moderate right and small left inguinal hernias contain fat. Bladder wall irregularity with multiple saccules and a small anterior left diverticulum including on 05/04.  Scattered colonic diverticula.  No significant free fluid.  IMPRESSION: 1. No evidence of high-grade or macroscopic prostate carcinoma. 2. Prostatomegaly with findings of bladder outlet obstruction. 3. Mild enlargement of a right inguinal node since 2019, now up to 11 mm. Presumably reactive. 4. Fat containing inguinal hernias, as before.   Electronically Signed   By: Abigail Miyamoto M.D.   On: 08/29/2020 11:26   Assessment & Plan:    1.  Elevated PSA/Abnormal exam -prostate mri scheduled   No follow-ups on file.     [name], PA-C  [facility] [address] [place], [place] [address] [phone]

## 2020-08-30 ENCOUNTER — Encounter: Payer: Self-pay | Admitting: Urology

## 2020-08-30 ENCOUNTER — Ambulatory Visit: Payer: Medicare Other | Admitting: Urology

## 2020-08-30 DIAGNOSIS — E785 Hyperlipidemia, unspecified: Secondary | ICD-10-CM | POA: Diagnosis not present

## 2020-08-30 DIAGNOSIS — L89616 Pressure-induced deep tissue damage of right heel: Secondary | ICD-10-CM | POA: Diagnosis not present

## 2020-08-30 DIAGNOSIS — I69315 Cognitive social or emotional deficit following cerebral infarction: Secondary | ICD-10-CM | POA: Diagnosis not present

## 2020-08-30 DIAGNOSIS — R300 Dysuria: Secondary | ICD-10-CM | POA: Diagnosis not present

## 2020-08-30 DIAGNOSIS — I129 Hypertensive chronic kidney disease with stage 1 through stage 4 chronic kidney disease, or unspecified chronic kidney disease: Secondary | ICD-10-CM | POA: Diagnosis not present

## 2020-08-30 DIAGNOSIS — N183 Chronic kidney disease, stage 3 unspecified: Secondary | ICD-10-CM | POA: Diagnosis not present

## 2020-09-02 ENCOUNTER — Other Ambulatory Visit: Payer: Self-pay

## 2020-09-02 ENCOUNTER — Encounter: Payer: Medicare Other | Admitting: Physician Assistant

## 2020-09-02 ENCOUNTER — Telehealth: Payer: Self-pay

## 2020-09-02 DIAGNOSIS — Z7901 Long term (current) use of anticoagulants: Secondary | ICD-10-CM | POA: Diagnosis not present

## 2020-09-02 DIAGNOSIS — R296 Repeated falls: Secondary | ICD-10-CM | POA: Diagnosis not present

## 2020-09-02 DIAGNOSIS — L89610 Pressure ulcer of right heel, unstageable: Secondary | ICD-10-CM | POA: Diagnosis not present

## 2020-09-02 DIAGNOSIS — L89890 Pressure ulcer of other site, unstageable: Secondary | ICD-10-CM | POA: Diagnosis not present

## 2020-09-02 DIAGNOSIS — R972 Elevated prostate specific antigen [PSA]: Secondary | ICD-10-CM

## 2020-09-02 MED ORDER — FINASTERIDE 5 MG PO TABS: 5.0000 mg | ORAL_TABLET | Freq: Every day | ORAL | 11 refills | Status: DC

## 2020-09-02 NOTE — Telephone Encounter (Signed)
Patient notified, script sent to pharmacy and lab appt was scheduled for 48mo, order placed

## 2020-09-02 NOTE — Progress Notes (Addendum)
SILVIA, MARKUSON ([id number]) Visit Report for 09/02/2020 Chief Complaint Document Details [name], [name] Date of Service: 09/02/2020 12:30 PM Medical Record Number: [id number] Patient Account Number: [id number] Date of Birth/Sex: Aug 29, 1934 (86 y.o. M) Treating RN: Donnamarie Poag Primary Care Provider: Nobie Putnam Other Clinician: Referring Provider: Nobie Putnam Treating Provider/Extender: Skipper Cliche in Treatment: 13 Information Obtained from: Patient Chief Complaint Right heel ulcer Electronic Signature(s) Signed: 09/02/2020 1:16:21 PM By: Worthy Keeler PA-C Entered By: Worthy Keeler on 09/02/2020 13:16:21 Runco, [name] ([id number]) -------------------------------------------------------------------------------- HPI Details [name] Date of Service: 09/02/2020 12:30 PM Medical Record Number: [id number] Patient Account Number: [id number] Date of Birth/Sex: [birth year] (86 y.o. M) Treating RN: Donnamarie Poag Primary Care Provider: Nobie Putnam Other Clinician: Referring Provider: Nobie Putnam Treating Provider/Extender: Skipper Cliche in Treatment: 13 History of Present Illness HPI Description: 06/03/2020 upon evaluation today patient actually presents for initial inspection here in our clinic after having gone to urgent care this morning where they were concerned about infection as well as a significant "softball sized wound with eschar" over the heel. With that being said the patient fortunately does not have an area that largely does have an area of eschar which is draining around the edges really not stable and I think we are getting to remove the eschar so we try to clean up the surface of the wound. Fortunately there is no evidence of active infection at this time. I do believe that he is going require some debridement both manually today with scissors and forceps as well as enzymatic/chemically with the  medications. I really feel like that he has a good chance to get this to heal if he keep pressure off of it we did discuss that again today. He does note this has been present for about 2 months. He is not taking any of his current medications that we did put him on the list he tells me that he only occasionally takes them which is obviously not doing anything for him. He does have a cane but he does not use it and his ABI was 1.14 on the right. 06/10/2020 upon evaluation today patient appears to be doing well with regard to his wound all things considered. He still has a lot of necrotic tissue in the central portion of the wound. I think the Iodosorb is still the right thing to do and again the biggest issue is he quit using it as he and his wife felt like that the drainage following putting on the medicine was really "nasty". Nonetheless I explained that the weight Iodoflex breaks down or Iodosorb and that round does make it look really bad but it actually is not as bad as what it seems. In fact it is the normal course of things. 06/24/2020 upon evaluation today patient appears to be doing unfortunately about the same in regard to his heel. I do not think this is any worse. However its become increasingly clear that he has significant dementia in my opinion. We have had multiple instances going on here from the standpoint of trying to get him to take the oral antibiotic. Initially we ordered it for him he picked it up on 1 afternoon and by the next morning we called to make sure that he had gotten it he told us yes but that he had thrown it away. That he was not to take any additional medications. We then told him he needed to be taken this  to get the infection under control he subsequently look for it only found the bottle never took the medication therefore I sent it in a second time for him. After sending this and it looks like he has not really taken it since that time he tells me that he took 7  pills at once although I do not think they were all of the same medication and then following this it made him sick I think these were all of his different medicines. He decided he was not to take any further medication after that point. Nonetheless I do believe he still is going require treatment for this infection the good news is he has a friend who is a very good friend that is with him here today to try to help with figuring out what to do and how going forward this should be taken care of. 07/01/2020 upon evaluation today patient appears to be doing really about the same. His measurements are maybe slightly smaller than previous. Fortunately there is no signs of active infection which is great news. No fevers, chills, nausea, vomiting, or diarrhea. 07/08/2020 upon evaluation today patient appears to be doing better in regard to his wounds. He looks like he finally actually took the Levaquin which has helped with the infection. I think things are doing much better in that regard which is great news. No fevers, chills, nausea, vomiting, or diarrhea. Unfortunately he did have actual Kleenex/tissue paper over the wound on really not sure why his wife put that on there but nonetheless I think were ready to start wrapping this week. 07/15/2020 on evaluation today patient appears to be doing about the same in regard to his wound. Unfortunately there does not appear to be any significant improvement. With that being said he also came in today with the heel of his wrap completely hanging out where the wound was exposed and just rubbing on the bedroom slipper that he has on today. With that being said this is obviously not ideal. I discussed that with the patient I really think that he is getting need to be more careful and conscientious about what is going on with his wrap. With that being said some of this is to know followed on his own that he actually has in my opinion significant dementia which inhibits  his ability to be able to comply. He has a friend with him today who is trying to help out as best he can I am just not certain that [name] is actually able to do what needs to be done here. 07/22/20 upon evaluation today patient appears to be doing well with regard to his wound is measuring a little bit smaller. With that being said he did not have any dressings on whatsoever upon evaluation today it was just gauze wrapped Curlex over his leg. Subsequently the Coban following this really was not the appropriate wrap and more importantly he had no dressing actually on the wound itself. Fortunately there does not appear to be any signs of active infection which is great news. Unfortunately my main concern here is that the patient needs to have appropriate dressings on in order to prevent infection and allow for continued and appropriate healing. 07/29/2020 upon evaluation today patient appears to be doing okay in regard to his wound. Unfortunately still do not have the Hydrofera Blue in place in fact it appears that he had a wet-to-dry dressing again applied to the heel even after our conversation with the home  health supervisor last week where they told us that that is what the nurse had put in place and that we have been contacted to advise [place] that that was being done. We had never been contacted in any such regard prior to that being done. At that point we advised that until they get the [facility] in they could use a silver alginate dressing. Nonetheless again today the patient has a wet-to-dry dressing only on he is very macerated around the edges he is also very hyper granulated in regard to the wound bed both a direct result of inappropriate dressings being applied to this wound. That is a direct cause of issues going on with home health not following orders as prescribed by the clinic here. 08/05/2020 upon evaluation today patient appears to be doing excellent in regard to his wound. He has  been tolerating the dressing changes without complication and overall very pleased with where things stand currently. With that being said the patient did not have the ABD pad in place and I really think he may actually be able to do better with XtraSorb even at this time. Nonetheless I think that if we can get this swelling under control and then turned the edema lessens is weeping then that will be a dramatic improvement. We can work on that currently. 08/19/2020 upon evaluation today patient appears to be doing excellent in regard to his heel ulcer. He has been tolerating the dressing changes without complication. I do feel like he is making good progress which is great news. There does not appear to be any signs of significant infection at this point at all which is also excellent. PAYSON, EVRARD ([id number]) 08/26/2020 upon evaluation today patient appears to be doing excellent in regard to his heel ulcer. He is draining a lot but I think the big issue here is that he probably needs to be elevating his legs a lot more to help with controlling some of this edema. Fortunately there is no evidence of active infection at this time which is great news. No fevers, chills, nausea, vomiting, or diarrhea. 09/02/2020 upon evaluation today patient appears to be doing well with regard to his wound. He is measuring better and the wound is looking much smaller today. I am very pleased with where things stand. He still has a lot of drainage but he tells me he has been trying to elevate his legs as much as possible which I think is helping to some degree. Electronic Signature(s) Signed: 09/02/2020 2:19:02 PM By: Worthy Keeler PA-C Entered By: Worthy Keeler on 09/02/2020 14:19:02 Wenberg, [name] ([id number]) -------------------------------------------------------------------------------- Physical Exam Details [name] Date of Service: 09/02/2020 12:30 PM Medical Record Number:  [id number] Patient Account Number: [id number] Date of Birth/Sex: 01-15-1935 (85 y.o. M) Treating RN: Donnamarie Poag Primary Care Provider: Nobie Putnam Other Clinician: Referring Provider: Nobie Putnam Treating Provider/Extender: Skipper Cliche in Treatment: 15 Constitutional Well-nourished and well-hydrated in no acute distress. Respiratory normal breathing without difficulty. Psychiatric this patient is able to make decisions and demonstrates good insight into disease process. Alert and Oriented x 3. pleasant and cooperative. Notes Upon inspection patient's wound bed actually showed signs of good granulation epithelization at this point. There does not appear to be any evidence of active infection which is great news and overall very pleased with where things stand today. Electronic Signature(s) Signed: 09/02/2020 2:19:20 PM By: Worthy Keeler PA-C Entered By: Worthy Keeler on 09/02/2020 14:19:18 Litsey,  [name] ([id number]) -------------------------------------------------------------------------------- Physician Orders Details Patient Name: GRANT, SWAGER. Date of Service: 09/02/2020 12:30 PM Medical Record Number: [id number] Patient Account Number: [id number] Date of Birth/Sex: 1934-11-11 (85 y.o. M) Treating RN: Donnamarie Poag Primary Care Provider: Nobie Putnam Other Clinician: Referring Provider: Nobie Putnam Treating Provider/Extender: Skipper Cliche in Treatment: 44 Verbal / Phone Orders: No Diagnosis Coding ICD-10 Coding Code Description L89.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, unstageable R29.6 Repeated falls Z79.01 Long term (current) use of anticoagulants Follow-up Appointments o Return Appointment in 1 week. [place] for wound care. May utilize formulary equivalent dressing for wound treatment orders unless otherwise specified. Home Health Nurse may visit PRN to address patientos wound care needs. -  [facility] to change on Wednesdays and Fridays , patient to be seen in clinic on [place] Orders/Instructions: - Please [place] OF HEALING Edema Control - Lymphedema / Segmental Compressive Device / Other o Optional: One layer of unna paste to top of compression wrap (to act as an anchor). o Elevate, Exercise Daily and Avoid Standing for Long Periods of Time. - AVOID WALKING MORE THAN NEEDED o Elevate legs to the level of the heart and pump ankles as often as possible o Elevate leg(s) parallel to the floor when sitting. Off-Loading o Open toe surgical shoe - right foot Wound Treatment Wound #1 - Foot Wound Laterality: Right, Lateral Cleanser: [place] Ancillary Kit - 15 Day Supply (Generic) 3 x Per Week/30 Days Discharge Instructions: Use supplies as instructed; Kit contains: (15) Saline Bullets; (15) 3x3 Gauze; 15 pr Gloves Cleanser: Normal Saline (Generic) 3 x Per Week/30 Days Discharge Instructions: Wash your hands with soap and water. Remove old dressing, discard into plastic bag and place into trash. Cleanse the wound with Normal Saline prior to applying a clean dressing using gauze sponges, not tissues or cotton balls. Do not scrub or use excessive force. Pat dry using gauze sponges, not tissue or cotton balls. Primary Dressing: Hydrofera Blue Ready Transfer Foam, 2.5x2.5 (in/in) (Generic) 3 x Per Week/30 Days Discharge Instructions: Apply Hydrofera Blue Ready to wound bed as directed Secondary Dressing: Xtrasorb Medium 4x5 (in/in) (Generic) 3 x Per Week/30 Days Discharge Instructions: Apply to wound as directed. Do not cut. Compression Wrap: Profore Lite LF 3 Multilayer Compression Bandaging System (Generic) 3 x Per Week/30 Days Discharge Instructions: Apply 3 multi-layer wrap as prescribed. Electronic Signature(s) Signed: 09/02/2020 5:15:41 PM By: Worthy Keeler PA-C Signed: 09/03/2020 2:35:55  PM By: Donnamarie Poag Entered By: Donnamarie Poag on 09/02/2020 13:22:02 Sher, [name] ([id number]) -------------------------------------------------------------------------------- Problem List Details [name] Date of Service: 09/02/2020 12:30 PM Medical Record Number: [id number] Patient Account Number: [id number] Date of Birth/Sex: 05-26-1934 (85 y.o. M) Treating RN: Donnamarie Poag Primary Care Provider: Nobie Putnam Other Clinician: Referring Provider: Nobie Putnam Treating Provider/Extender: Skipper Cliche in Treatment: 13 Active Problems ICD-10 Encounter Code Description Active Date MDM Diagnosis L89.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, unstageable 06/03/2020 No Yes R29.6 Repeated falls 06/03/2020 No Yes Z79.01 Long term (current) use of anticoagulants 06/03/2020 No Yes Inactive Problems Resolved Problems Electronic Signature(s) Signed: 09/02/2020 1:11:39 PM By: Worthy Keeler PA-C Entered By: Worthy Keeler on 09/02/2020 13:11:39 Pannone, [name] ([id number]) -------------------------------------------------------------------------------- Progress Note Details [name] Date of Service: 09/02/2020 12:30 PM Medical Record Number: [id number] Patient Account Number: [id number] Date of Birth/Sex: July 18, 1934 (85 y.o. M) Treating RN:  Donnamarie Poag Primary Care Provider: Nobie Putnam Other Clinician: Referring Provider: Nobie Putnam Treating Provider/Extender: Skipper Cliche in Treatment: 13 Subjective Chief Complaint Information obtained from Patient Right heel ulcer History of Present Illness (HPI) 06/03/2020 upon evaluation today patient actually presents for initial inspection here in our clinic after having gone to urgent care this morning where they were concerned about infection as well as a significant "softball sized wound with eschar" over the heel. With that being said the patient fortunately does not  have an area that largely does have an area of eschar which is draining around the edges really not stable and I think we are getting to remove the eschar so we try to clean up the surface of the wound. Fortunately there is no evidence of active infection at this time. I do believe that he is going require some debridement both manually today with scissors and forceps as well as enzymatic/chemically with the medications. I really feel like that he has a good chance to get this to heal if he keep pressure off of it we did discuss that again today. He does note this has been present for about 2 months. He is not taking any of his current medications that we did put him on the list he tells me that he only occasionally takes them which is obviously not doing anything for him. He does have a cane but he does not use it and his ABI was 1.14 on the right. 06/10/2020 upon evaluation today patient appears to be doing well with regard to his wound all things considered. He still has a lot of necrotic tissue in the central portion of the wound. I think the Iodosorb is still the right thing to do and again the biggest issue is he quit using it as he and his wife felt like that the drainage following putting on the medicine was really "nasty". Nonetheless I explained that the weight Iodoflex breaks down or Iodosorb and that round does make it look really bad but it actually is not as bad as what it seems. In fact it is the normal course of things. 06/24/2020 upon evaluation today patient appears to be doing unfortunately about the same in regard to his heel. I do not think this is any worse. However its become increasingly clear that he has significant dementia in my opinion. We have had multiple instances going on here from the standpoint of trying to get him to take the oral antibiotic. Initially we ordered it for him he picked it up on 1 afternoon and by the next morning we called to make sure that he had gotten  it he told us yes but that he had thrown it away. That he was not to take any additional medications. We then told him he needed to be taken this to get the infection under control he subsequently look for it only found the bottle never took the medication therefore I sent it in a second time for him. After sending this and it looks like he has not really taken it since that time he tells me that he took 7 pills at once although I do not think they were all of the same medication and then following this it made him sick I think these were all of his different medicines. He decided he was not to take any further medication after that point. Nonetheless I do believe he still is going require treatment for this infection the good news is  he has a friend who is a very good friend that is with him here today to try to help with figuring out what to do and how going forward this should be taken care of. 07/01/2020 upon evaluation today patient appears to be doing really about the same. His measurements are maybe slightly smaller than previous. Fortunately there is no signs of active infection which is great news. No fevers, chills, nausea, vomiting, or diarrhea. 07/08/2020 upon evaluation today patient appears to be doing better in regard to his wounds. He looks like he finally actually took the Levaquin which has helped with the infection. I think things are doing much better in that regard which is great news. No fevers, chills, nausea, vomiting, or diarrhea. Unfortunately he did have actual Kleenex/tissue paper over the wound on really not sure why his wife put that on there but nonetheless I think were ready to start wrapping this week. 07/15/2020 on evaluation today patient appears to be doing about the same in regard to his wound. Unfortunately there does not appear to be any significant improvement. With that being said he also came in today with the heel of his wrap completely hanging out where the  wound was exposed and just rubbing on the bedroom slipper that he has on today. With that being said this is obviously not ideal. I discussed that with the patient I really think that he is getting need to be more careful and conscientious about what is going on with his wrap. With that being said some of this is to know followed on his own that he actually has in my opinion significant dementia which inhibits his ability to be able to comply. He has a friend with him today who is trying to help out as best he can I am just not certain that [name] is actually able to do what needs to be done here. 07/22/20 upon evaluation today patient appears to be doing well with regard to his wound is measuring a little bit smaller. With that being said he did not have any dressings on whatsoever upon evaluation today it was just gauze wrapped Curlex over his leg. Subsequently the Coban following this really was not the appropriate wrap and more importantly he had no dressing actually on the wound itself. Fortunately there does not appear to be any signs of active infection which is great news. Unfortunately my main concern here is that the patient needs to have appropriate dressings on in order to prevent infection and allow for continued and appropriate healing. 07/29/2020 upon evaluation today patient appears to be doing okay in regard to his wound. Unfortunately still do not have the Hydrofera Blue in place in fact it appears that he had a wet-to-dry dressing again applied to the heel even after our conversation with the home health supervisor last week where they told us that that is what the nurse had put in place and that we have been contacted to advise [place] that that was being done. We had never been contacted in any such regard prior to that being done. At that point we advised that until they get the [facility] in they could use a silver alginate dressing. Nonetheless again today the patient has a  wet-to-dry dressing only on he is very macerated around the edges he is also very hyper granulated in regard to the wound bed both a direct result of inappropriate dressings being applied to this wound. That is a direct cause of  issues going on with home health not following orders as prescribed by the clinic here. 08/05/2020 upon evaluation today patient appears to be doing excellent in regard to his wound. He has been tolerating the dressing changes without complication and overall very pleased with where things stand currently. With that being said the patient did not have the ABD pad in place and I really think he may actually be able to do better with XtraSorb even at this time. Nonetheless I think that if we can get this swelling Nguyen, Jonathan D. ([id number]) under control and then turned the edema lessens is weeping then that will be a dramatic improvement. We can work on that currently. 08/19/2020 upon evaluation today patient appears to be doing excellent in regard to his heel ulcer. He has been tolerating the dressing changes without complication. I do feel like he is making good progress which is great news. There does not appear to be any signs of significant infection at this point at all which is also excellent. 08/26/2020 upon evaluation today patient appears to be doing excellent in regard to his heel ulcer. He is draining a lot but I think the big issue here is that he probably needs to be elevating his legs a lot more to help with controlling some of this edema. Fortunately there is no evidence of active infection at this time which is great news. No fevers, chills, nausea, vomiting, or diarrhea. 09/02/2020 upon evaluation today patient appears to be doing well with regard to his wound. He is measuring better and the wound is looking much smaller today. I am very pleased with where things stand. He still has a lot of drainage but he tells me he has been trying to elevate his legs as much as  possible which I think is helping to some degree. Objective Constitutional Well-nourished and well-hydrated in no acute distress. Vitals Time Taken: 12:48 PM, Height: 71 in, Weight: 216 lbs, BMI: 30.1, Temperature: 97.8 F, Pulse: 70 bpm, Respiratory Rate: 18 breaths/min, Blood Pressure: 152/74 mmHg. Respiratory normal breathing without difficulty. Psychiatric this patient is able to make decisions and demonstrates good insight into disease process. Alert and Oriented x 3. pleasant and cooperative. General Notes: Upon inspection patient's wound bed actually showed signs of good granulation epithelization at this point. There does not appear to be any evidence of active infection which is great news and overall very pleased with where things stand today. Integumentary (Hair, Skin) Wound #1 status is Open. Original cause of wound was Pressure Injury. The date acquired was: 04/03/2020. The wound has been in treatment 13 weeks. The wound is located on the Right,Lateral Foot. The wound measures 2cm length x 2cm width x 0.1cm depth; 3.142cm^2 area and 0.314cm^3 volume. There is Fat Layer (Subcutaneous Tissue) exposed. There is no tunneling or undermining noted. There is a large amount of serosanguineous drainage noted. The wound margin is thickened. There is large (67-100%) red, hyper - granulation within the wound bed. There is a small (1-33%) amount of necrotic tissue within the wound bed including Adherent Slough. Assessment Active Problems ICD-10 Pressure ulcer of right heel, unstageable Repeated falls Long term (current) use of anticoagulants Procedures Wound #1 Pre-procedure diagnosis of Wound #1 is a Pressure Ulcer located on the Right,Lateral Foot . There was a Three Layer Compression Therapy Procedure by Donnamarie Poag, RN. Post procedure Diagnosis Wound #1: Same as Pre-Procedure HOPE, HOLST ([id number]) Plan Follow-up Appointments: Return Appointment in 1 week. Home  Health: [facility]  for wound care. May utilize formulary equivalent dressing for wound treatment orders unless otherwise specified. Home Health Nurse may visit PRN to address patient s wound care needs. - [facility] to change on Wednesdays and Fridays , patient to be seen in clinic on [place] Orders/Instructions: - Please [place] OF HEALING Edema Control - Lymphedema / Segmental Compressive Device / Other: Optional: One layer of unna paste to top of compression wrap (to act as an anchor). Elevate, Exercise Daily and Avoid Standing for Long Periods of Time. - AVOID WALKING MORE THAN NEEDED Elevate legs to the level of the heart and pump ankles as often as possible Elevate leg(s) parallel to the floor when sitting. Off-Loading: Open toe surgical shoe - right foot WOUND #1: - Foot Wound Laterality: Right, Lateral Cleanser: Byram Ancillary Kit - 15 Day Supply (Generic) 3 x Per Week/30 Days Discharge Instructions: Use supplies as instructed; Kit contains: (15) Saline Bullets; (15) 3x3 Gauze; 15 pr Gloves Cleanser: Normal Saline (Generic) 3 x Per Week/30 Days Discharge Instructions: Wash your hands with soap and water. Remove old dressing, discard into plastic bag and place into trash. Cleanse the wound with Normal Saline prior to applying a clean dressing using gauze sponges, not tissues or cotton balls. Do not scrub or use excessive force. Pat dry using gauze sponges, not tissue or cotton balls. Primary Dressing: Hydrofera Blue Ready Transfer Foam, 2.5x2.5 (in/in) (Generic) 3 x Per Week/30 Days Discharge Instructions: Apply Hydrofera Blue Ready to wound bed as directed Secondary Dressing: Xtrasorb Medium 4x5 (in/in) (Generic) 3 x Per Week/30 Days Discharge Instructions: Apply to wound as directed. Do not cut. Compression Wrap: Profore Lite LF 3 Multilayer Compression Bandaging System (Generic) 3 x Per  Week/30 Days Discharge Instructions: Apply 3 multi-layer wrap as prescribed. 1. Would recommend that we going continue with the [facility] dressing I think that still doing a great job. 2. We will continue with XtraSorb to cover followed by the compression wrap. We have been utilizing currently a 3 layer compression wrap. We will see patient back for reevaluation in 1 week here in the clinic. If anything worsens or changes patient will contact our office for additional recommendations. Electronic Signature(s) Signed: 09/02/2020 2:19:36 PM By: Worthy Keeler PA-C Entered By: Worthy Keeler on 09/02/2020 14:19:34 [name], [name] ([id number]) -------------------------------------------------------------------------------- SuperBill Details [name] Date of Service: 09/02/2020 Medical Record Number: [id number] Patient Account Number: [id number] Date of Birth/Sex: 1934-06-18 (86 y.o. M) Treating RN: Donnamarie Poag Primary Care Provider: Nobie Putnam Other Clinician: Referring Provider: Nobie Putnam Treating Provider/Extender: Skipper Cliche in Treatment: 13 Diagnosis Coding ICD-10 Codes Code Description L89.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, unstageable R29.6 Repeated falls Z79.01 Long term (current) use of anticoagulants Facility Procedures CPT4 Code: [id number] Description: (Facility Use Only) [phone] - [place] RT LEG Modifier: Quantity: 1 Physician Procedures CPT4 Code: 8185909 Description: 31121 - WC PHYS LEVEL 3 - EST PT Modifier: Quantity: 1 CPT4 Code: Description: ICD-10 Diagnosis Description L89.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, unstageable R29.6 Repeated falls Z79.01 Long term (current) use of anticoagulants Modifier: Quantity: Electronic Signature(s) Signed: 09/02/2020 2:19:57 PM By: Worthy Keeler PA-C Entered By: Worthy Keeler on 09/02/2020 14:19:56

## 2020-09-02 NOTE — Telephone Encounter (Signed)
-----   Message from [name], PA-C sent at 09/02/2020  8:56 AM EDT ----- Please let [name] know that his prostate MRI did not show any high-grade lesions suspicious for prostate cancer.  I would have him start finasteride 5 mg daily and have his PSA rechecked in 3 months.

## 2020-09-03 NOTE — Progress Notes (Signed)
[name] ([id number]) Visit Report for 09/02/2020 Arrival Information Details [name] Date of Service: 09/02/2020 12:30 PM Medical Record Number: [id number] Patient Account Number: [id number] Date of Birth/Sex: [birth year] (85 y.o. M) Treating RN: Carlene Coria Primary Care Dennise Raabe: Nobie Putnam Other Clinician: Referring Lisvet Rasheed: Nobie Putnam Treating Reighan Hipolito/Extender: Skipper Cliche in Treatment: 61 Visit Information History Since Last Visit All ordered tests and consults were completed: No Patient Arrived: Ambulatory Added or deleted any medications: No Arrival Time: 12:47 Any new allergies or adverse reactions: No Accompanied By: self Had a fall or experienced change in No Transfer Assistance: None activities of daily living that may affect Patient Identification Verified: Yes risk of falls: Secondary Verification Process Completed: Yes Signs or symptoms of abuse/neglect since last visito No Patient Requires Transmission-Based Precautions: No Hospitalized since last visit: No Patient Has Alerts: Yes Implantable device outside of the clinic excluding No Patient Alerts: Not Diabetic cellular tissue based products placed in the center Plavix since last visit: Has Dressing in Place as Prescribed: Yes Pain Present Now: No Electronic Signature(s) Signed: 09/02/2020 8:20:21 PM By: Carlene Coria RN Entered By: Carlene Coria on 09/02/2020 12:48:09 [name] ([id number]) -------------------------------------------------------------------------------- Clinic Level of Care Assessment Details [name] Date of Service: 09/02/2020 12:30 PM Medical Record Number: [id number] Patient Account Number: [id number] Date of Birth/Sex: 1935/01/02 (85 y.o. M) Treating RN: Donnamarie Poag Primary Care Jaevion Goto: Nobie Putnam Other Clinician: Referring Darleen Moffitt: Nobie Putnam Treating Yann Biehn/Extender: Skipper Cliche in Treatment: 13 Clinic Level of Care Assessment Items TOOL 1 Quantity Score '[]'  - Use when EandM and Procedure is performed on INITIAL visit 0 ASSESSMENTS - Nursing Assessment / Reassessment '[]'  - General Physical Exam (combine w/ comprehensive assessment (listed just below) when performed on new 0 pt. evals) '[]'  - 0 Comprehensive Assessment (HX, ROS, Risk Assessments, Wounds Hx, etc.) ASSESSMENTS - Wound and Skin Assessment / Reassessment '[]'  - Dermatologic / Skin Assessment (not related to wound area) 0 ASSESSMENTS - Ostomy and/or Continence Assessment and Care '[]'  - Incontinence Assessment and Management 0 '[]'  - 0 Ostomy Care Assessment and Management (repouching, etc.) PROCESS - Coordination of Care '[]'  - Simple Patient / Family Education for ongoing care 0 '[]'  - 0 Complex (extensive) Patient / Family Education for ongoing care '[]'  - 0 Staff obtains Programmer, systems, Records, Test Results / Process Orders '[]'  - 0 Staff telephones HHA, Nursing Homes / Clarify orders / etc '[]'  - 0 Routine Transfer to another Facility (non-emergent condition) '[]'  - 0 Routine Hospital Admission (non-emergent condition) '[]'  - 0 New Admissions / Biomedical engineer / Ordering NPWT, Apligraf, etc. '[]'  - 0 Emergency Hospital Admission (emergent condition) PROCESS - Special Needs '[]'  - Pediatric / Minor Patient Management 0 '[]'  - 0 Isolation Patient Management '[]'  - 0 Hearing / Language / Visual special needs '[]'  - 0 Assessment of Community assistance (transportation, D/C planning, etc.) '[]'  - 0 Additional assistance / Altered mentation '[]'  - 0 Support Surface(s) Assessment (bed, cushion, seat, etc.) INTERVENTIONS - Miscellaneous '[]'  - External ear exam 0 '[]'  - 0 Patient Transfer (multiple staff / Civil Service fast streamer / Similar devices) '[]'  - 0 Simple Staple / Suture removal (25 or less) '[]'  - 0 Complex Staple / Suture removal (26 or more) '[]'  - 0 Hypo/Hyperglycemic Management (do not check if billed  separately) '[]'  - 0 Ankle / Brachial Index (ABI) - do not check if billed separately Has the patient been seen at the hospital within the last three years:  Yes Total Score: 0 Level Of Care: ____ [name] ([id number]) Electronic Signature(s) Signed: 09/03/2020 2:35:55 PM By: Donnamarie Poag Entered By: Donnamarie Poag on 09/02/2020 13:22:38 [name] ([id number]) -------------------------------------------------------------------------------- Compression Therapy Details [name] Date of Service: 09/02/2020 12:30 PM Medical Record Number: [id number] Patient Account Number: [id number] Date of Birth/Sex: 10/24/1934 (85 y.o. M) Treating RN: Donnamarie Poag Primary Care Marinell Igarashi: Nobie Putnam Other Clinician: Referring Markia Kyer: Nobie Putnam Treating Angelicia Lessner/Extender: Skipper Cliche in Treatment: 13 Compression Therapy Performed for Wound Assessment: Wound #1 Right,Lateral Foot Performed By: Junius Argyle, RN Compression Type: Three Layer Post Procedure Diagnosis Same as Pre-procedure Electronic Signature(s) Signed: 09/03/2020 2:35:55 PM By: Donnamarie Poag Entered By: Donnamarie Poag on 09/02/2020 13:21:27 [name] ([id number]) -------------------------------------------------------------------------------- Encounter Discharge Information Details [name] Date of Service: 09/02/2020 12:30 PM Medical Record Number: [id number] Patient Account Number: [id number] Date of Birth/Sex: [birth year] (85 y.o. M) Treating RN: Donnamarie Poag Primary Care Margean Korell: Nobie Putnam Other Clinician: Jeanine Luz Referring Tanaia Hawkey: Nobie Putnam Treating Lillyth Spong/Extender: Skipper Cliche in Treatment: 13 Encounter Discharge Information Items Discharge Condition: Stable Ambulatory Status: Ambulatory Discharge Destination: Home Transportation: Private Auto Accompanied By: self Schedule Follow-up Appointment:  Yes Clinical Summary of Care: Electronic Signature(s) Signed: 09/02/2020 5:31:07 PM By: Jeanine Luz Entered By: Jeanine Luz on 09/02/2020 13:43:55 Blansett, Cyndie Nguyen ([id number]) -------------------------------------------------------------------------------- Lower Extremity Assessment Details [name] Date of Service: 09/02/2020 12:30 PM Medical Record Number: [id number] Patient Account Number: [id number] Date of Birth/Sex: October 02, 1934 (85 y.o. M) Treating RN: Carlene Coria Primary Care Andreanna Mikolajczak: Nobie Putnam Other Clinician: Referring Makana Feigel: Nobie Putnam Treating Lawanda Holzheimer/Extender: Skipper Cliche in Treatment: 13 Edema Assessment Assessed: [Left: No] Patrice Paradise: No] [Left: Edema] [Right: :] Calf Left: Right: Point of Measurement: 31 cm From Medial Instep 37 cm Ankle Left: Right: Point of Measurement: 12 cm From Medial Instep 29 cm Vascular Assessment Pulses: Dorsalis Pedis Palpable: [Right:Yes] Electronic Signature(s) Signed: 09/02/2020 8:20:21 PM By: Carlene Coria RN Entered By: Carlene Coria on 09/02/2020 12:55:11 Salzwedel, Stedman DMarland Kitchen ([id number]) -------------------------------------------------------------------------------- Multi Wound Chart Details [name] Date of Service: 09/02/2020 12:30 PM Medical Record Number: [id number] Patient Account Number: [id number] Date of Birth/Sex: Mar 06, 1935 (85 y.o. M) Treating RN: Donnamarie Poag Primary Care Katrinna Travieso: Nobie Putnam Other Clinician: Referring Ayani Ospina: Nobie Putnam Treating Adynn Caseres/Extender: Skipper Cliche in Treatment: 13 Vital Signs Height(in): 71 Pulse(bpm): 75 Weight(lbs): 216 Blood Pressure(mmHg): 152/74 Body Mass Index(BMI): 30 Temperature(F): 97.8 Respiratory Rate(breaths/min): 18 Photos: [N/A:N/A] Wound Location: Right, Lateral Foot N/A N/A Wounding Event: Pressure Injury N/A N/A Primary Etiology: Pressure Ulcer N/A  N/A Comorbid History: Cataracts, Hypertension N/A N/A Date Acquired: 04/03/2020 N/A N/A Weeks of Treatment: 13 N/A N/A Wound Status: Open N/A N/A Measurements L x W x D (cm) 2x2x0.1 N/A N/A Area (cm) : 3.142 N/A N/A Volume (cm) : 0.314 N/A N/A % Reduction in Area: 86.00% N/A N/A % Reduction in Volume: 97.20% N/A N/A Classification: Unstageable/Unclassified N/A N/A Exudate Amount: Large N/A N/A Exudate Type: Serosanguineous N/A N/A Exudate Color: red, brown N/A N/A Wound Margin: Thickened N/A N/A Granulation Amount: Large (67-100%) N/A N/A Granulation Quality: Red, Hyper-granulation N/A N/A Necrotic Amount: Small (1-33%) N/A N/A Exposed Structures: Fat Layer (Subcutaneous Tissue): N/A N/A Yes Fascia: No Tendon: No Muscle: No Joint: No Bone: No Epithelialization: Small (1-33%) N/A N/A Treatment Notes Electronic Signature(s) Signed: 09/03/2020 2:35:55 PM By: Donnamarie Poag Entered By: Donnamarie Poag on 09/02/2020 13:21:06 Welles, Cyndie Nguyen ([id number]) -------------------------------------------------------------------------------- Vivian  Plan Details Patient Name: BORDEN, THUNE. Date of Service: 09/02/2020 12:30 PM Medical Record Number: [id number] Patient Account Number: [id number] Date of Birth/Sex: Jun 25, 1934 (85 y.o. M) Treating RN: Donnamarie Poag Primary Care Salimah Martinovich: Nobie Putnam Other Clinician: Referring [facility]: Nobie Putnam Treating Marria Mathison/Extender: Skipper Cliche in Treatment: 13 Active Inactive Wound/Skin Impairment Nursing Diagnoses: Knowledge deficit related to ulceration/compromised skin integrity Goals: Patient/caregiver will verbalize understanding of skin care regimen Date Initiated: 06/03/2020 Date Inactivated: 08/20/2020 Target Resolution Date: 08/31/2020 Goal Status: Met Ulcer/skin breakdown will have a volume reduction of 30% by week 4 Date Initiated: 06/03/2020 Date Inactivated: 07/01/2020 Target Resolution Date:  07/01/2020 Goal Status: Met Ulcer/skin breakdown will have a volume reduction of 50% by week 8 Date Initiated: 07/01/2020 Date Inactivated: 08/05/2020 Target Resolution Date: 08/01/2020 Goal Status: Met Ulcer/skin breakdown will have a volume reduction of 80% by week 12 Date Initiated: 07/01/2020 Date Inactivated: 09/02/2020 Target Resolution Date: 08/31/2020 Goal Status: Met Ulcer/skin breakdown will heal within 14 weeks Date Initiated: 07/01/2020 Target Resolution Date: 10/01/2020 Goal Status: Active Interventions: Assess patient/caregiver ability to obtain necessary supplies Assess patient/caregiver ability to perform ulcer/skin care regimen upon admission and as needed Assess ulceration(s) every visit Notes: Electronic Signature(s) Signed: 09/03/2020 2:35:55 PM By: Donnamarie Poag Entered By: Donnamarie Poag on 09/02/2020 13:20:51 Covel, Cyndie Nguyen ([id number]) -------------------------------------------------------------------------------- Pain Assessment Details [name] Date of Service: 09/02/2020 12:30 PM Medical Record Number: [id number] Patient Account Number: [id number] Date of Birth/Sex: 1934/05/01 (85 y.o. M) Treating RN: Carlene Coria Primary Care Kerry-Anne Mezo: Nobie Putnam Other Clinician: Referring Kiron Osmun: Nobie Putnam Treating Eleno Weimar/Extender: Skipper Cliche in Treatment: 13 Active Problems Location of Pain Severity and Description of Pain Patient Has Paino No Site Locations Pain Management and Medication Current Pain Management: Electronic Signature(s) Signed: 09/02/2020 8:20:21 PM By: Carlene Coria RN Entered By: Carlene Coria on 09/02/2020 12:48:37 Stander, Cyndie Nguyen ([id number]) -------------------------------------------------------------------------------- Patient/Caregiver Education Details [name] Date of Service: 09/02/2020 12:30 PM Medical Record Number: [id number] Patient Account Number: [id number] Date of  Birth/Gender: 26-Jan-1935 (85 y.o. M) Treating RN: Donnamarie Poag Primary Care Physician: Nobie Putnam Other Clinician: Referring Physician: Nobie Putnam Treating Physician/Extender: Skipper Cliche in Treatment: 13 Education Assessment Education Provided To: Patient Education Topics Provided Offloading: Methods: Explain/Verbal Responses: State content correctly Electronic Signature(s) Signed: 09/03/2020 2:35:55 PM By: Donnamarie Poag Entered By: Donnamarie Poag on 09/02/2020 13:23:25 Albarran, Cyndie Nguyen ([id number]) -------------------------------------------------------------------------------- Wound Assessment Details [name] Date of Service: 09/02/2020 12:30 PM Medical Record Number: [id number] Patient Account Number: [id number] Date of Birth/Sex: [birth year] (85 y.o. M) Treating RN: Carlene Coria Primary Care Filimon Miranda: Nobie Putnam Other Clinician: Referring Dacey Milberger: Nobie Putnam Treating Wynona Duhamel/Extender: Skipper Cliche in Treatment: 13 Wound Status Wound Number: 1 Primary Etiology: Pressure Ulcer Wound Location: Right, Lateral Foot Wound Status: Open Wounding Event: Pressure Injury Comorbid History: Cataracts, Hypertension Date Acquired: 04/03/2020 Weeks Of Treatment: 13 Clustered Wound: No Photos Wound Measurements Length: (cm) 2 Width: (cm) 2 Depth: (cm) 0.1 Area: (cm) 3.142 Volume: (cm) 0.314 % Reduction in Area: 86% % Reduction in Volume: 97.2% Epithelialization: Small (1-33%) Tunneling: No Undermining: No Wound Description Classification: Unstageable/Unclassified Wound Margin: Thickened Exudate Amount: Large Exudate Type: Serosanguineous Exudate Color: red, brown Foul Odor After Cleansing: No Slough/Fibrino Yes Wound Bed Granulation Amount: Large (67-100%) Exposed Structure Granulation Quality: Red, Hyper-granulation Fascia Exposed: No Necrotic Amount: Small (1-33%) Fat Layer (Subcutaneous Tissue)  Exposed: Yes Necrotic Quality: Adherent Slough Tendon Exposed: No Muscle Exposed: No Joint Exposed: No Bone Exposed:  No Treatment Notes Wound #1 (Foot) Wound Laterality: Right, Lateral Cleanser Byram Ancillary Kit - 15 Day Supply Discharge Instruction: Use supplies as instructed; Kit contains: (15) Saline Bullets; (15) 3x3 Gauze; 15 pr Gloves Normal Saline Vath, Tannon D. ([id number]) Discharge Instruction: Wash your hands with soap and water. Remove old dressing, discard into plastic bag and place into trash. Cleanse the wound with Normal Saline prior to applying a clean dressing using gauze sponges, not tissues or cotton balls. Do not scrub or use excessive force. Pat dry using gauze sponges, not tissue or cotton balls. Peri-Wound Care Topical Primary Dressing Hydrofera Blue Ready Transfer Foam, 2.5x2.5 (in/in) Discharge Instruction: Apply Hydrofera Blue Ready to wound bed as directed Secondary Dressing Xtrasorb Medium 4x5 (in/in) Discharge Instruction: Apply to wound as directed. Do not cut. Secured With Compression Wrap Profore Lite LF 3 Multilayer Compression Bandaging System Discharge Instruction: Apply 3 multi-layer wrap as prescribed. Compression Stockings Add-Ons Electronic Signature(s) Signed: 09/02/2020 8:20:21 PM By: Carlene Coria RN Entered By: Carlene Coria on 09/02/2020 12:54:49 Mira, Cyndie Nguyen ([id number]) -------------------------------------------------------------------------------- Vitals Details [name] Date of Service: 09/02/2020 12:30 PM Medical Record Number: [id number] Patient Account Number: [id number] Date of Birth/Sex: 10/20/1934 (85 y.o. M) Treating RN: Carlene Coria Primary Care Kathrynn Backstrom: Nobie Putnam Other Clinician: Referring Shaquitta Burbridge: Nobie Putnam Treating Marionna Gonia/Extender: Skipper Cliche in Treatment: 13 Vital Signs Time Taken: 12:48 Temperature (F): 97.8 Height (in): 71 Pulse (bpm): 70 Weight  (lbs): 216 Respiratory Rate (breaths/min): 18 Body Mass Index (BMI): 30.1 Blood Pressure (mmHg): 152/74 Reference Range: 80 - 120 mg / dl Electronic Signature(s) Signed: 09/02/2020 8:20:21 PM By: Carlene Coria RN Entered By: Carlene Coria on 09/02/2020 12:48:31

## 2020-09-04 ENCOUNTER — Telehealth: Payer: Self-pay

## 2020-09-04 DIAGNOSIS — E785 Hyperlipidemia, unspecified: Secondary | ICD-10-CM | POA: Diagnosis not present

## 2020-09-04 DIAGNOSIS — I69315 Cognitive social or emotional deficit following cerebral infarction: Secondary | ICD-10-CM | POA: Diagnosis not present

## 2020-09-04 DIAGNOSIS — I129 Hypertensive chronic kidney disease with stage 1 through stage 4 chronic kidney disease, or unspecified chronic kidney disease: Secondary | ICD-10-CM | POA: Diagnosis not present

## 2020-09-04 DIAGNOSIS — R300 Dysuria: Secondary | ICD-10-CM | POA: Diagnosis not present

## 2020-09-04 DIAGNOSIS — L89616 Pressure-induced deep tissue damage of right heel: Secondary | ICD-10-CM | POA: Diagnosis not present

## 2020-09-04 DIAGNOSIS — N183 Chronic kidney disease, stage 3 unspecified: Secondary | ICD-10-CM | POA: Diagnosis not present

## 2020-09-06 DIAGNOSIS — E785 Hyperlipidemia, unspecified: Secondary | ICD-10-CM | POA: Diagnosis not present

## 2020-09-06 DIAGNOSIS — R300 Dysuria: Secondary | ICD-10-CM | POA: Diagnosis not present

## 2020-09-06 DIAGNOSIS — N183 Chronic kidney disease, stage 3 unspecified: Secondary | ICD-10-CM | POA: Diagnosis not present

## 2020-09-06 DIAGNOSIS — L89616 Pressure-induced deep tissue damage of right heel: Secondary | ICD-10-CM | POA: Diagnosis not present

## 2020-09-06 DIAGNOSIS — I69315 Cognitive social or emotional deficit following cerebral infarction: Secondary | ICD-10-CM | POA: Diagnosis not present

## 2020-09-06 DIAGNOSIS — I129 Hypertensive chronic kidney disease with stage 1 through stage 4 chronic kidney disease, or unspecified chronic kidney disease: Secondary | ICD-10-CM | POA: Diagnosis not present

## 2020-09-09 ENCOUNTER — Encounter: Payer: Medicare Other | Admitting: Internal Medicine

## 2020-09-09 ENCOUNTER — Other Ambulatory Visit: Payer: Self-pay

## 2020-09-09 DIAGNOSIS — L89610 Pressure ulcer of right heel, unstageable: Secondary | ICD-10-CM | POA: Diagnosis not present

## 2020-09-09 DIAGNOSIS — Z7901 Long term (current) use of anticoagulants: Secondary | ICD-10-CM | POA: Diagnosis not present

## 2020-09-09 DIAGNOSIS — R296 Repeated falls: Secondary | ICD-10-CM | POA: Diagnosis not present

## 2020-09-09 DIAGNOSIS — L89890 Pressure ulcer of other site, unstageable: Secondary | ICD-10-CM | POA: Diagnosis not present

## 2020-09-11 DIAGNOSIS — L89616 Pressure-induced deep tissue damage of right heel: Secondary | ICD-10-CM | POA: Diagnosis not present

## 2020-09-11 DIAGNOSIS — N183 Chronic kidney disease, stage 3 unspecified: Secondary | ICD-10-CM | POA: Diagnosis not present

## 2020-09-11 DIAGNOSIS — I69315 Cognitive social or emotional deficit following cerebral infarction: Secondary | ICD-10-CM | POA: Diagnosis not present

## 2020-09-11 DIAGNOSIS — R300 Dysuria: Secondary | ICD-10-CM | POA: Diagnosis not present

## 2020-09-11 DIAGNOSIS — I129 Hypertensive chronic kidney disease with stage 1 through stage 4 chronic kidney disease, or unspecified chronic kidney disease: Secondary | ICD-10-CM | POA: Diagnosis not present

## 2020-09-11 DIAGNOSIS — E785 Hyperlipidemia, unspecified: Secondary | ICD-10-CM | POA: Diagnosis not present

## 2020-09-11 NOTE — Progress Notes (Signed)
[name], [name] ([id number]) Visit Report for 09/09/2020 HPI Details [name], [name] Date of Service: 09/09/2020 12:30 PM Medical Record Number: [id number] Patient Account Number: [id number] Date of Birth/Sex: 16-Jul-1934 (86 y.o. M) Treating RN: Primary Care Provider: Nobie Putnam Other Clinician: Referring Provider: Nobie Putnam Treating Provider/Extender: Tito Dine in Treatment: 14 History of Present Illness HPI Description: 06/03/2020 upon evaluation today patient actually presents for initial inspection here in our clinic after having gone to urgent care this morning where they were concerned about infection as well as a significant "softball sized wound with eschar" over the heel. With that being said the patient fortunately does not have an area that largely does have an area of eschar which is draining around the edges really not stable and I think we are getting to remove the eschar so we try to clean up the surface of the wound. Fortunately there is no evidence of active infection at this time. I do believe that he is going require some debridement both manually today with scissors and forceps as well as enzymatic/chemically with the medications. I really feel like that he has a good chance to get this to heal if he keep pressure off of it we did discuss that again today. He does note this has been present for about 2 months. He is not taking any of his current medications that we did put him on the list he tells me that he only occasionally takes them which is obviously not doing anything for him. He does have a cane but he does not use it and his ABI was 1.14 on the right. 06/10/2020 upon evaluation today patient appears to be doing well with regard to his wound all things considered. He still has a lot of necrotic tissue in the central portion of the wound. I think the Iodosorb is still the right thing to do and again the biggest issue is he  quit using it as he and his wife felt like that the drainage following putting on the medicine was really "nasty". Nonetheless I explained that the weight Iodoflex breaks down or Iodosorb and that round does make it look really bad but it actually is not as bad as what it seems. In fact it is the normal course of things. 06/24/2020 upon evaluation today patient appears to be doing unfortunately about the same in regard to his heel. I do not think this is any worse. However its become increasingly clear that he has significant dementia in my opinion. We have had multiple instances going on here from the standpoint of trying to get him to take the oral antibiotic. Initially we ordered it for him he picked it up on 1 afternoon and by the next morning we called to make sure that he had gotten it he told us yes but that he had thrown it away. That he was not to take any additional medications. We then told him he needed to be taken this to get the infection under control he subsequently look for it only found the bottle never took the medication therefore I sent it in a second time for him. After sending this and it looks like he has not really taken it since that time he tells me that he took 7 pills at once although I do not think they were all of the same medication and then following this it made him sick I think these were all of his different medicines. He decided he  was not to take any further medication after that point. Nonetheless I do believe he still is going require treatment for this infection the good news is he has a friend who is a very good friend that is with him here today to try to help with figuring out what to do and how going forward this should be taken care of. 07/01/2020 upon evaluation today patient appears to be doing really about the same. His measurements are maybe slightly smaller than previous. Fortunately there is no signs of active infection which is great news. No fevers,  chills, nausea, vomiting, or diarrhea. 07/08/2020 upon evaluation today patient appears to be doing better in regard to his wounds. He looks like he finally actually took the Levaquin which has helped with the infection. I think things are doing much better in that regard which is great news. No fevers, chills, nausea, vomiting, or diarrhea. Unfortunately he did have actual Kleenex/tissue paper over the wound on really not sure why his wife put that on there but nonetheless I think were ready to start wrapping this week. 07/15/2020 on evaluation today patient appears to be doing about the same in regard to his wound. Unfortunately there does not appear to be any significant improvement. With that being said he also came in today with the heel of his wrap completely hanging out where the wound was exposed and just rubbing on the bedroom slipper that he has on today. With that being said this is obviously not ideal. I discussed that with the patient I really think that he is getting need to be more careful and conscientious about what is going on with his wrap. With that being said some of this is to know followed on his own that he actually has in my opinion significant dementia which inhibits his ability to be able to comply. He has a friend with him today who is trying to help out as best he can I am just not certain that Mr. Potts is actually able to do what needs to be done here. 07/22/20 upon evaluation today patient appears to be doing well with regard to his wound is measuring a little bit smaller. With that being said he did not have any dressings on whatsoever upon evaluation today it was just gauze wrapped Curlex over his leg. Subsequently the Coban following this really was not the appropriate wrap and more importantly he had no dressing actually on the wound itself. Fortunately there does not appear to be any signs of active infection which is great news. Unfortunately my main concern here is  that the patient needs to have appropriate dressings on in order to prevent infection and allow for continued and appropriate healing. 07/29/2020 upon evaluation today patient appears to be doing okay in regard to his wound. Unfortunately still do not have the Hydrofera Blue in place in fact it appears that he had a wet-to-dry dressing again applied to the heel even after our conversation with the home health supervisor last week where they told us that that is what the nurse had put in place and that we have been contacted to advise [place] that that was being done. We had never been contacted in any such regard prior to that being done. At that point we advised that until they get the [facility] in they could use a silver alginate dressing. Nonetheless again today the patient has a wet-to-dry dressing only on he is very macerated around the edges he is also  very hyper granulated in regard to the wound bed both a direct result of inappropriate dressings being applied to this wound. That is a direct cause of issues going on with home health not following orders as prescribed by the clinic here. 08/05/2020 upon evaluation today patient appears to be doing excellent in regard to his wound. He has been tolerating the dressing changes without complication and overall very pleased with where things stand currently. With that being said the patient did not have the ABD pad in place and I really think he may actually be able to do better with XtraSorb even at this time. Nonetheless I think that if we can get this swelling under control and then turned the edema lessens is weeping then that will be a dramatic improvement. We can work on that currently. RODDRICK, SHARRON ([id number]) 08/19/2020 upon evaluation today patient appears to be doing excellent in regard to his heel ulcer. He has been tolerating the dressing changes without complication. I do feel like he is making good progress which is great news. There does  not appear to be any signs of significant infection at this point at all which is also excellent. 08/26/2020 upon evaluation today patient appears to be doing excellent in regard to his heel ulcer. He is draining a lot but I think the big issue here is that he probably needs to be elevating his legs a lot more to help with controlling some of this edema. Fortunately there is no evidence of active infection at this time which is great news. No fevers, chills, nausea, vomiting, or diarrhea. 09/02/2020 upon evaluation today patient appears to be doing well with regard to his wound. He is measuring better and the wound is looking much smaller today. I am very pleased with where things stand. He still has a lot of drainage but he tells me he has been trying to elevate his legs as much as possible which I think is helping to some degree. 5/23; wound on the right lateral foot. Using Hydrofera Blue 3 layer compression. Electronic Signature(s) Signed: 09/09/2020 3:42:13 PM By: Linton Ham MD Entered By: Linton Ham on 09/09/2020 12:46:38 [name], [name] ([id number]) -------------------------------------------------------------------------------- Physical Exam Details [name] Date of Service: 09/09/2020 12:30 PM Medical Record Number: [id number] Patient Account Number: [id number] Date of Birth/Sex: 1935/01/30 (86 y.o. M) Treating RN: Primary Care Provider: Nobie Putnam Other Clinician: Referring Provider: Nobie Putnam Treating Provider/Extender: Tito Dine in Treatment: 14 Constitutional Patient is hypertensive.. Pulse regular and within target range for patient.Marland Kitchen Respirations regular, non-labored and within target range.. Temperature is normal and within the target range for the patient.Marland Kitchen appears in no distress. Cardiovascular Pedal pulses are palpable. Integumentary (Hair, Skin) No surrounding erythema or tenderness. Notes Wound exam; right  lateral calcaneus. Under illumination the wound bed laxity looks very healthy. No debridement is required. There is no surrounding erythema no tenderness. Electronic Signature(s) Signed: 09/09/2020 3:42:13 PM By: Linton Ham MD Entered By: Linton Ham on 09/09/2020 12:47:52 [name], [name] ([id number]) -------------------------------------------------------------------------------- Physician Orders Details [name] Date of Service: 09/09/2020 12:30 PM Medical Record Number: [id number] Patient Account Number: [id number] Date of Birth/Sex: [name] 14, 1936 (86 y.o. M) Treating RN: [name] Primary Care Provider: Nobie Putnam Other Clinician: Referring Provider: Nobie Putnam Treating Provider/Extender: Tito Dine in Treatment: 70 Verbal / Phone Orders: No Diagnosis Coding Follow-up Appointments o Return Appointment in 1 week. [place] for wound care. May  utilize formulary equivalent dressing for wound treatment orders unless otherwise specified. Home Health Nurse may visit [facility] to address patientos wound care needs. - [facility] to change on Wednesdays and Fridays , patient to be seen in clinic on [place] Orders/Instructions: - Please [facility] OF HEALING Edema Control - Lymphedema / Segmental Compressive Device / Other o Optional: One layer of unna paste to top of compression wrap (to act as an anchor). o Elevate, Exercise Daily and Avoid Standing for Long Periods of Time. - AVOID WALKING MORE THAN NEEDED o Elevate legs to the level of the heart and pump ankles as often as possible o Elevate leg(s) parallel to the floor when sitting. Off-Loading o Open toe surgical shoe - right foot Wound Treatment Wound #1 - Foot Wound Laterality: Right, Lateral Cleanser: Normal Saline (Generic) 3 x Per Week/30 Days Discharge  Instructions: Wash your hands with soap and water. Remove old dressing, discard into plastic bag and place into trash. Cleanse the wound with Normal Saline prior to applying a clean dressing using gauze sponges, not tissues or cotton balls. Do not scrub or use excessive force. Pat dry using gauze sponges, not tissue or cotton balls. Primary Dressing: Hydrofera Blue Ready Transfer Foam, 2.5x2.5 (in/in) (Generic) 3 x Per Week/30 Days Discharge Instructions: Apply Hydrofera Blue Ready to wound bed as directed Secondary Dressing: Xtrasorb Medium 4x5 (in/in) (Generic) 3 x Per Week/30 Days Discharge Instructions: Apply to wound as directed. Do not cut. Compression Wrap: Profore Lite LF 3 Multilayer Compression Bandaging System (Generic) 3 x Per Week/30 Days Discharge Instructions: Apply 3 multi-layer wrap as prescribed. Electronic Signature(s) Signed: 09/09/2020 3:42:13 PM By: Linton Ham MD Signed: 09/10/2020 5:46:47 PM By: Gretta Cool, BSN, RN, CWS, Kim RN, BSN Entered By: Gretta Cool, BSN, RN, CWS, Kim on 09/09/2020 12:44:35 Crowell, [name] ([id number]) -------------------------------------------------------------------------------- Problem List Details [name], [name] Date of Service: 09/09/2020 12:30 PM Medical Record Number: [id number] Patient Account Number: [id number] Date of Birth/Sex: 01-Jan-1935 (86 y.o. M) Treating RN: Primary Care Provider: Nobie Putnam Other Clinician: Referring Provider: Nobie Putnam Treating Provider/Extender: Tito Dine in Treatment: 14 Active Problems ICD-10 Encounter Code Description Active Date MDM Diagnosis L89.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, unstageable 06/03/2020 No Yes R29.6 Repeated falls 06/03/2020 No Yes Z79.01 Long term (current) use of anticoagulants 06/03/2020 No Yes Inactive Problems Resolved Problems Electronic Signature(s) Signed: 09/09/2020 3:42:13 PM By: Linton Ham MD Entered By: Linton Ham on  09/09/2020 12:46:08 Chilson, [name] ([id number]) -------------------------------------------------------------------------------- Progress Note Details [name] Date of Service: 09/09/2020 12:30 PM Medical Record Number: [id number] Patient Account Number: [id number] Date of Birth/Sex: [birth year] (86 y.o. M) Treating RN: Primary Care Provider: Nobie Putnam Other Clinician: Referring Provider: Nobie Putnam Treating Provider/Extender: Tito Dine in Treatment: 14 Subjective History of Present Illness (HPI) 06/03/2020 upon evaluation today patient actually presents for initial inspection here in our clinic after having gone to urgent care this morning where they were concerned about infection as well as a significant "softball sized wound with eschar" over the heel. With that being said the patient fortunately does not have an area that largely does have an area of eschar which is draining around the edges really not stable and I think we are getting to remove the eschar so we try to clean up the surface of the wound. Fortunately there is no evidence of active infection at this time. I  do believe that he is going require some debridement both manually today with scissors and forceps as well as enzymatic/chemically with the medications. I really feel like that he has a good chance to get this to heal if he keep pressure off of it we did discuss that again today. He does note this has been present for about 2 months. He is not taking any of his current medications that we did put him on the list he tells me that he only occasionally takes them which is obviously not doing anything for him. He does have a cane but he does not use it and his ABI was 1.14 on the right. 06/10/2020 upon evaluation today patient appears to be doing well with regard to his wound all things considered. He still has a lot of necrotic tissue in the central portion of the wound. I  think the Iodosorb is still the right thing to do and again the biggest issue is he quit using it as he and his wife felt like that the drainage following putting on the medicine was really "nasty". Nonetheless I explained that the weight Iodoflex breaks down or Iodosorb and that round does make it look really bad but it actually is not as bad as what it seems. In fact it is the normal course of things. 06/24/2020 upon evaluation today patient appears to be doing unfortunately about the same in regard to his heel. I do not think this is any worse. However its become increasingly clear that he has significant dementia in my opinion. We have had multiple instances going on here from the standpoint of trying to get him to take the oral antibiotic. Initially we ordered it for him he picked it up on 1 afternoon and by the next morning we called to make sure that he had gotten it he told us yes but that he had thrown it away. That he was not to take any additional medications. We then told him he needed to be taken this to get the infection under control he subsequently look for it only found the bottle never took the medication therefore I sent it in a second time for him. After sending this and it looks like he has not really taken it since that time he tells me that he took 7 pills at once although I do not think they were all of the same medication and then following this it made him sick I think these were all of his different medicines. He decided he was not to take any further medication after that point. Nonetheless I do believe he still is going require treatment for this infection the good news is he has a friend who is a very good friend that is with him here today to try to help with figuring out what to do and how going forward this should be taken care of. 07/01/2020 upon evaluation today patient appears to be doing really about the same. His measurements are maybe slightly smaller than  previous. Fortunately there is no signs of active infection which is great news. No fevers, chills, nausea, vomiting, or diarrhea. 07/08/2020 upon evaluation today patient appears to be doing better in regard to his wounds. He looks like he finally actually took the Levaquin which has helped with the infection. I think things are doing much better in that regard which is great news. No fevers, chills, nausea, vomiting, or diarrhea. Unfortunately he did have actual Kleenex/tissue paper over the wound on really  not sure why his wife put that on there but nonetheless I think were ready to start wrapping this week. 07/15/2020 on evaluation today patient appears to be doing about the same in regard to his wound. Unfortunately there does not appear to be any significant improvement. With that being said he also came in today with the heel of his wrap completely hanging out where the wound was exposed and just rubbing on the bedroom slipper that he has on today. With that being said this is obviously not ideal. I discussed that with the patient I really think that he is getting need to be more careful and conscientious about what is going on with his wrap. With that being said some of this is to know followed on his own that he actually has in my opinion significant dementia which inhibits his ability to be able to comply. He has a friend with him today who is trying to help out as best he can I am just not certain that Mr. Darling is actually able to do what needs to be done here. 07/22/20 upon evaluation today patient appears to be doing well with regard to his wound is measuring a little bit smaller. With that being said he did not have any dressings on whatsoever upon evaluation today it was just gauze wrapped Curlex over his leg. Subsequently the Coban following this really was not the appropriate wrap and more importantly he had no dressing actually on the wound itself. Fortunately there does not appear to  be any signs of active infection which is great news. Unfortunately my main concern here is that the patient needs to have appropriate dressings on in order to prevent infection and allow for continued and appropriate healing. 07/29/2020 upon evaluation today patient appears to be doing okay in regard to his wound. Unfortunately still do not have the Hydrofera Blue in place in fact it appears that he had a wet-to-dry dressing again applied to the heel even after our conversation with the home health supervisor last week where they told us that that is what the nurse had put in place and that we have been contacted to advise [place] that that was being done. We had never been contacted in any such regard prior to that being done. At that point we advised that until they get the [facility] in they could use a silver alginate dressing. Nonetheless again today the patient has a wet-to-dry dressing only on he is very macerated around the edges he is also very hyper granulated in regard to the wound bed both a direct result of inappropriate dressings being applied to this wound. That is a direct cause of issues going on with home health not following orders as prescribed by the clinic here. 08/05/2020 upon evaluation today patient appears to be doing excellent in regard to his wound. He has been tolerating the dressing changes without complication and overall very pleased with where things stand currently. With that being said the patient did not have the ABD pad in place and I really think he may actually be able to do better with XtraSorb even at this time. Nonetheless I think that if we can get this swelling under control and then turned the edema lessens is weeping then that will be a dramatic improvement. We can work on that currently. 08/19/2020 upon evaluation today patient appears to be doing excellent in regard to his heel ulcer. He has been tolerating the dressing changes without complication. I do  feel  like he is making good progress which is great news. There does not appear to be any signs of significant infection at this point at all which is also excellent. [name], [name] ([id number]) 08/26/2020 upon evaluation today patient appears to be doing excellent in regard to his heel ulcer. He is draining a lot but I think the big issue here is that he probably needs to be elevating his legs a lot more to help with controlling some of this edema. Fortunately there is no evidence of active infection at this time which is great news. No fevers, chills, nausea, vomiting, or diarrhea. 09/02/2020 upon evaluation today patient appears to be doing well with regard to his wound. He is measuring better and the wound is looking much smaller today. I am very pleased with where things stand. He still has a lot of drainage but he tells me he has been trying to elevate his legs as much as possible which I think is helping to some degree. 5/23; wound on the right lateral foot. Using Hydrofera Blue 3 layer compression. Objective Constitutional Patient is hypertensive.. Pulse regular and within target range for patient.Marland Kitchen Respirations regular, non-labored and within target range.. Temperature is normal and within the target range for the patient.Marland Kitchen appears in no distress. Vitals Time Taken: 12:31 PM, Height: 71 in, Weight: 216 lbs, BMI: 30.1, Temperature: 98.3 F, Pulse: 76 bpm, Respiratory Rate: 18 breaths/min, Blood Pressure: 145/65 mmHg. Cardiovascular Pedal pulses are palpable. General Notes: Wound exam; right lateral calcaneus. Under illumination the wound bed laxity looks very healthy. No debridement is required. There is no surrounding erythema no tenderness. Integumentary (Hair, Skin) No surrounding erythema or tenderness. Wound #1 status is Open. Original cause of wound was Pressure Injury. The date acquired was: 04/03/2020. The wound has been in treatment 14 weeks. The wound is located on the Right,Lateral  Foot. The wound measures 1.5cm length x 2cm width x 0.1cm depth; 2.356cm^2 area and 0.236cm^3 volume. There is Fat Layer (Subcutaneous Tissue) exposed. There is no tunneling or undermining noted. There is a large amount of serosanguineous drainage noted. The wound margin is thickened. There is large (67-100%) red, hyper - granulation within the wound bed. There is a small (1-33%) amount of necrotic tissue within the wound bed including Adherent Slough. Assessment Active Problems ICD-10 Pressure ulcer of right heel, unstageable Repeated falls Long term (current) use of anticoagulants Procedures Wound #1 Pre-procedure diagnosis of Wound #1 is a Pressure Ulcer located on the Right,Lateral Foot . There was a Three Layer Compression Therapy Procedure with a pre-treatment ABI of 1.1 by [name], RN. Post procedure Diagnosis Wound #1: Same as Pre-Procedure Plan [name], [name] ([id number]) Follow-up Appointments: Return Appointment in 1 week. Home Health: [facility] for wound care. May utilize formulary equivalent dressing for wound treatment orders unless otherwise specified. Home Health Nurse may visit PRN to address patient s wound care needs. - [facility] to change on Wednesdays and Fridays , patient to be seen in clinic on [place] Orders/Instructions: - Please [place] [facility] Edema Control - Lymphedema / Segmental Compressive Device / Other: Optional: One layer of unna paste to top of compression wrap (to act as an anchor). Elevate, Exercise Daily and Avoid Standing for Long Periods of Time. - AVOID WALKING MORE THAN NEEDED Elevate legs to the level of the heart and pump ankles as often as possible Elevate leg(s) parallel to  the floor when sitting. Off-Loading: Open toe surgical shoe - right foot WOUND #1: - Foot Wound Laterality: Right, Lateral Cleanser: Normal Saline (Generic) 3 x Per Week/30  Days Discharge Instructions: Wash your hands with soap and water. Remove old dressing, discard into plastic bag and place into trash. Cleanse the wound with Normal Saline prior to applying a clean dressing using gauze sponges, not tissues or cotton balls. Do not scrub or use excessive force. Pat dry using gauze sponges, not tissue or cotton balls. Primary Dressing: Hydrofera Blue Ready Transfer Foam, 2.5x2.5 (in/in) (Generic) 3 x Per Week/30 Days Discharge Instructions: Apply Hydrofera Blue Ready to wound bed as directed Secondary Dressing: Xtrasorb Medium 4x5 (in/in) (Generic) 3 x Per Week/30 Days Discharge Instructions: Apply to wound as directed. Do not cut. Compression Wrap: Profore Lite LF 3 Multilayer Compression Bandaging System (Generic) 3 x Per Week/30 Days Discharge Instructions: Apply 3 multi-layer wrap as prescribed. 1. We continue with Hydrofera Blue Xtrasorb under 3 layer compression. The wound looks very healthy and measures smaller 2. He is not specifically offloading this although anything we do here might affect his balance and he already has a history of falling. I discussed with him staying off his feet is much as he possibly can Electronic Signature(s) Signed: 09/09/2020 3:42:13 PM By: Linton Ham MD Entered By: Linton Ham on 09/09/2020 12:48:45 [name], [name] ([id number]) -------------------------------------------------------------------------------- SuperBill Details [name] Date of Service: 09/09/2020 Medical Record Number: [id number] Patient Account Number: [id number] Date of Birth/Sex: October 21, 1934 (86 y.o. M) Treating RN: [name] Primary Care Provider: Nobie Putnam Other Clinician: Referring Provider: Nobie Putnam Treating Provider/Extender: Tito Dine in Treatment: 14 Diagnosis Coding ICD-10 Codes Code Description L89.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, unstageable R29.6 Repeated falls Z79.01 Long  term (current) use of anticoagulants Facility Procedures CPT4 Code: 60600459 Description: (Facility Use Only) [id number] - [place] RT LEG Modifier: Quantity: 1 Physician Procedures CPT4 Code: 3953202 Description: 33435 - WC PHYS LEVEL 3 - EST PT Modifier: Quantity: 1 CPT4 Code: Description: ICD-10 Diagnosis Description L89.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, unstageable Modifier: Quantity: Electronic Signature(s) Signed: 09/09/2020 3:42:13 PM By: Linton Ham MD Entered By: Linton Ham on 09/09/2020 12:49:05

## 2020-09-12 NOTE — Progress Notes (Signed)
BRENEN, BEIGEL ([id number]) Visit Report for 09/09/2020 Arrival Information Details [name], [name] Date of Service: 09/09/2020 12:30 PM Medical Record Number: [id number] Patient Account Number: [id number] Date of Birth/Sex: Aug 31, 1934 (86 y.o. M) Treating RN: Carlene Coria Primary Care Lanetta Figuero: Nobie Putnam Other Clinician: Referring Babygirl Trager: Nobie Putnam Treating Mariafernanda Hendricksen/Extender: Tito Dine in Treatment: 14 Visit Information History Since Last Visit All ordered tests and consults were completed: No Patient Arrived: Ambulatory Added or deleted any medications: No Arrival Time: 12:28 Any new allergies or adverse reactions: No Accompanied By: self Had a fall or experienced change in No Transfer Assistance: None activities of daily living that may affect Patient Identification Verified: Yes risk of falls: Secondary Verification Process Completed: Yes Signs or symptoms of abuse/neglect since last visito No Patient Requires Transmission-Based Precautions: No Hospitalized since last visit: No Patient Has Alerts: Yes Implantable device outside of the clinic excluding No Patient Alerts: Not Diabetic cellular tissue based products placed in the center Plavix since last visit: Has Dressing in Place as Prescribed: Yes Pain Present Now: No Electronic Signature(s) Signed: 09/09/2020 2:18:56 PM By: Carlene Coria RN Entered By: Carlene Coria on 09/09/2020 12:31:13 [name], [name] ([id number]) -------------------------------------------------------------------------------- Compression Therapy Details [name] Date of Service: 09/09/2020 12:30 PM Medical Record Number: [id number] Patient Account Number: [id number] Date of Birth/Sex: August 13, 1934 (86 y.o. M) Treating RN: [name] Primary Care Natnael Biederman: Nobie Putnam Other Clinician: Referring Nakhi Choi: Nobie Putnam Treating Cambridge Deleo/Extender: Tito Dine in Treatment: 14 Compression Therapy Performed for Wound Assessment: Wound #1 Right,Lateral Foot Performed By: Clinician [name], RN Compression Type: Three Layer Pre Treatment ABI: 1.1 Post Procedure Diagnosis Same as Pre-procedure Electronic Signature(s) Signed: 09/10/2020 5:46:47 PM By: Gretta Cool, BSN, RN, CWS, Kim RN, BSN Entered By: Gretta Cool, BSN, RN, CWS, Kim on 09/09/2020 12:44:02 [name], [name] ([id number]) -------------------------------------------------------------------------------- Encounter Discharge Information Details [name] Date of Service: 09/09/2020 12:30 PM Medical Record Number: [id number] Patient Account Number: [id number] Date of Birth/Sex: 31-Jul-1934 (86 y.o. M) Treating RN: Primary Care Richell Corker: Nobie Putnam Other Clinician: Referring Corin Tilly: Nobie Putnam Treating Adisen Bennion/Extender: Tito Dine in Treatment: 14 Encounter Discharge Information Items Discharge Condition: Stable Ambulatory Status: Ambulatory Discharge Destination: Home Transportation: Private Auto Accompanied By: self Schedule Follow-up Appointment: Yes Clinical Summary of Care: Electronic Signature(s) Signed: 09/12/2020 4:37:07 PM By: Jeanine Luz Entered By: Jeanine Luz on 09/09/2020 13:02:51 Montuori, [name] ([id number]) -------------------------------------------------------------------------------- Lower Extremity Assessment Details [name] Date of Service: 09/09/2020 12:30 PM Medical Record Number: [id number] Patient Account Number: [id number] Date of Birth/Sex: Aug 11, 1934 (86 y.o. M) Treating RN: Carlene Coria Primary Care Thanvi Blincoe: Nobie Putnam Other Clinician: Referring Sevag Shearn: Nobie Putnam Treating Dalana Pfahler/Extender: Tito Dine in Treatment: 14 Edema Assessment Assessed: [Left: No] Patrice Paradise: No] Edema: [Left: Ye] [Right: s] Calf Left: Right: Point of  Measurement: 31 cm From Medial Instep 36 cm Ankle Left: Right: Point of Measurement: 12 cm From Medial Instep 24 cm Vascular Assessment Pulses: Dorsalis Pedis Palpable: [Right:Yes] Electronic Signature(s) Signed: 09/09/2020 2:18:56 PM By: Carlene Coria RN Entered By: Carlene Coria on 09/09/2020 12:39:45 [name], [name] ([id number]) -------------------------------------------------------------------------------- Multi Wound Chart Details [name] Date of Service: 09/09/2020 12:30 PM Medical Record Number: [id number] Patient Account Number: [id number] Date of Birth/Sex: [birth year] (86 y.o. M) Treating RN: [name] Primary Care Marcial Pless: Nobie Putnam Other Clinician: Referring Azul Coffie: Nobie Putnam Treating Gerhard Rappaport/Extender: Tito Dine in Treatment: 14 Vital Signs Height(in): 71  Pulse(bpm): 76 Weight(lbs): 216 Blood Pressure(mmHg): 145/65 Body Mass Index(BMI): 30 Temperature(F): 98.3 Respiratory Rate(breaths/min): 18 Photos: [N/A:N/A] Wound Location: Right, Lateral Foot N/A N/A Wounding Event: Pressure Injury N/A N/A Primary Etiology: Pressure Ulcer N/A N/A Comorbid History: Cataracts, Hypertension N/A N/A Date Acquired: 04/03/2020 N/A N/A Weeks of Treatment: 14 N/A N/A Wound Status: Open N/A N/A Measurements L x W x D (cm) 1.5x2x0.1 N/A N/A Area (cm) : 2.356 N/A N/A Volume (cm) : 0.236 N/A N/A % Reduction in Area: 89.50% N/A N/A % Reduction in Volume: 97.90% N/A N/A Classification: Unstageable/Unclassified N/A N/A Exudate Amount: Large N/A N/A Exudate Type: Serosanguineous N/A N/A Exudate Color: red, brown N/A N/A Wound Margin: Thickened N/A N/A Granulation Amount: Large (67-100%) N/A N/A Granulation Quality: Red, Hyper-granulation N/A N/A Necrotic Amount: Small (1-33%) N/A N/A Exposed Structures: Fat Layer (Subcutaneous Tissue): N/A N/A Yes Fascia: No Tendon: No Muscle: No Joint: No Bone:  No Epithelialization: Small (1-33%) N/A N/A Procedures Performed: Compression Therapy N/A N/A Treatment Notes Electronic Signature(s) Signed: 09/09/2020 3:42:13 PM By: Linton Ham MD Entered By: Linton Ham on 09/09/2020 12:46:14 [name], [name] ([id number]) -------------------------------------------------------------------------------- Multi-Disciplinary Care Plan Details [name] Date of Service: 09/09/2020 12:30 PM Medical Record Number: [id number] Patient Account Number: [id number] Date of Birth/Sex: 1934-06-08 (86 y.o. M) Treating RN: [name] Primary Care Slayde Brault: Nobie Putnam Other Clinician: Referring Colie Josten: Nobie Putnam Treating Cephus Tupy/Extender: Tito Dine in Treatment: 14 Active Inactive Wound/Skin Impairment Nursing Diagnoses: Knowledge deficit related to ulceration/compromised skin integrity Goals: Patient/caregiver will verbalize understanding of skin care regimen Date Initiated: 06/03/2020 Date Inactivated: 08/20/2020 Target Resolution Date: 08/31/2020 Goal Status: Met Ulcer/skin breakdown will have a volume reduction of 30% by week 4 Date Initiated: 06/03/2020 Date Inactivated: 07/01/2020 Target Resolution Date: 07/01/2020 Goal Status: Met Ulcer/skin breakdown will have a volume reduction of 50% by week 8 Date Initiated: 07/01/2020 Date Inactivated: 08/05/2020 Target Resolution Date: 08/01/2020 Goal Status: Met Ulcer/skin breakdown will have a volume reduction of 80% by week 12 Date Initiated: 07/01/2020 Date Inactivated: 09/02/2020 Target Resolution Date: 08/31/2020 Goal Status: Met Ulcer/skin breakdown will heal within 14 weeks Date Initiated: 07/01/2020 Target Resolution Date: 10/01/2020 Goal Status: Active Interventions: Assess patient/caregiver ability to obtain necessary supplies Assess patient/caregiver ability to perform ulcer/skin care regimen upon admission and as needed Assess ulceration(s)  every visit Notes: Electronic Signature(s) Signed: 09/10/2020 5:46:47 PM By: Gretta Cool, BSN, RN, CWS, Kim RN, BSN Entered By: Gretta Cool, BSN, RN, CWS, Kim on 09/09/2020 12:42:58 [name], [name] ([id number]) -------------------------------------------------------------------------------- Pain Assessment Details [name] Date of Service: 09/09/2020 12:30 PM Medical Record Number: [id number] Patient Account Number: [id number] Date of Birth/Sex: 1934/09/19 (86 y.o. M) Treating RN: Carlene Coria Primary Care Jazmarie Biever: Nobie Putnam Other Clinician: Referring Lucila Klecka: Nobie Putnam Treating Lequan Dobratz/Extender: Tito Dine in Treatment: 14 Active Problems Location of Pain Severity and Description of Pain Patient Has Paino No Site Locations Pain Management and Medication Current Pain Management: Electronic Signature(s) Signed: 09/09/2020 2:18:56 PM By: Carlene Coria RN Entered By: Carlene Coria on 09/09/2020 12:31:36 [name], [name] ([id number]) -------------------------------------------------------------------------------- Patient/Caregiver Education Details [name] Date of Service: 09/09/2020 12:30 PM Medical Record Number: [id number] Patient Account Number: [id number] Date of Birth/Gender: December 09, 1934 (86 y.o. M) Treating RN: [name] Primary Care Physician: Nobie Putnam Other Clinician: Referring Physician: Nobie Putnam Treating Physician/Extender: Tito Dine in Treatment: 14 Education Assessment Education Provided To: Patient Education Topics Provided Venous: Handouts: Controlling Swelling with Compression Stockings Methods: Demonstration, Explain/Verbal  Responses: State content correctly Wound/Skin Impairment: Handouts: Caring for Your Ulcer Methods: Demonstration, Explain/Verbal Responses: State content correctly Electronic Signature(s) Signed: 09/10/2020 5:46:47 PM By: Gretta Cool, BSN, RN,  CWS, Kim RN, BSN Entered By: Gretta Cool, BSN, RN, CWS, Kim on 09/09/2020 12:45:09 [name], [name] ([id number]) -------------------------------------------------------------------------------- Wound Assessment Details [name] Date of Service: 09/09/2020 12:30 PM Medical Record Number: [id number] Patient Account Number: [id number] Date of Birth/Sex: 1934/07/22 (86 y.o. M) Treating RN: Carlene Coria Primary Care Felecia Stanfill: Nobie Putnam Other Clinician: Referring Mairyn Lenahan: Nobie Putnam Treating Seichi Kaufhold/Extender: Tito Dine in Treatment: 14 Wound Status Wound Number: 1 Primary Etiology: Pressure Ulcer Wound Location: Right, Lateral Foot Wound Status: Open Wounding Event: Pressure Injury Comorbid History: Cataracts, Hypertension Date Acquired: 04/03/2020 Weeks Of Treatment: 14 Clustered Wound: No Photos Wound Measurements Length: (cm) 1.5 Width: (cm) 2 Depth: (cm) 0.1 Area: (cm) 2.356 Volume: (cm) 0.236 % Reduction in Area: 89.5% % Reduction in Volume: 97.9% Epithelialization: Small (1-33%) Tunneling: No Undermining: No Wound Description Classification: Unstageable/Unclassified Wound Margin: Thickened Exudate Amount: Large Exudate Type: Serosanguineous Exudate Color: red, brown Foul Odor After Cleansing: No Slough/Fibrino Yes Wound Bed Granulation Amount: Large (67-100%) Exposed Structure Granulation Quality: Red, Hyper-granulation Fascia Exposed: No Necrotic Amount: Small (1-33%) Fat Layer (Subcutaneous Tissue) Exposed: Yes Necrotic Quality: Adherent Slough Tendon Exposed: No Muscle Exposed: No Joint Exposed: No Bone Exposed: No Treatment Notes Wound #1 (Foot) Wound Laterality: Right, Lateral Cleanser Normal Saline Discharge Instruction: Wash your hands with soap and water. Remove old dressing, discard into plastic bag and place into trash. Cleanse the wound with Normal Saline prior to applying a clean dressing using  gauze sponges, not tissues or cotton balls. Do not scrub or use excessive force. Pat dry using gauze sponges, not tissue or cotton balls. SUKHRAJ, ESQUIVIAS ([id number]) Peri-Wound Care Topical Primary Dressing Hydrofera Blue Ready Transfer Foam, 2.5x2.5 (in/in) Discharge Instruction: Apply Hydrofera Blue Ready to wound bed as directed Secondary Dressing Xtrasorb Medium 4x5 (in/in) Discharge Instruction: Apply to wound as directed. Do not cut. Secured With Compression Wrap Profore Lite LF 3 Multilayer Compression Bandaging System Discharge Instruction: Apply 3 multi-layer wrap as prescribed. Compression Stockings Add-Ons Electronic Signature(s) Signed: 09/09/2020 2:18:56 PM By: Carlene Coria RN Entered By: Carlene Coria on 09/09/2020 12:38:45 [name], [name] ([id number]) -------------------------------------------------------------------------------- Vitals Details [name] Date of Service: 09/09/2020 12:30 PM Medical Record Number: [id number] Patient Account Number: [id number] Date of Birth/Sex: 04-26-1934 (86 y.o. M) Treating RN: Carlene Coria Primary Care Laquitha Heslin: Nobie Putnam Other Clinician: Referring Joslyne Marshburn: Nobie Putnam Treating Jorie Zee/Extender: Tito Dine in Treatment: 14 Vital Signs Time Taken: 12:31 Temperature (F): 98.3 Height (in): 71 Pulse (bpm): 76 Weight (lbs): 216 Respiratory Rate (breaths/min): 18 Body Mass Index (BMI): 30.1 Blood Pressure (mmHg): 145/65 Reference Range: 80 - 120 mg / dl Electronic Signature(s) Signed: 09/09/2020 2:18:56 PM By: Carlene Coria RN Entered By: Carlene Coria on 09/09/2020 12:31:30

## 2020-09-13 DIAGNOSIS — L89616 Pressure-induced deep tissue damage of right heel: Secondary | ICD-10-CM | POA: Diagnosis not present

## 2020-09-13 DIAGNOSIS — R300 Dysuria: Secondary | ICD-10-CM | POA: Diagnosis not present

## 2020-09-13 DIAGNOSIS — I69315 Cognitive social or emotional deficit following cerebral infarction: Secondary | ICD-10-CM | POA: Diagnosis not present

## 2020-09-13 DIAGNOSIS — E785 Hyperlipidemia, unspecified: Secondary | ICD-10-CM | POA: Diagnosis not present

## 2020-09-13 DIAGNOSIS — N183 Chronic kidney disease, stage 3 unspecified: Secondary | ICD-10-CM | POA: Diagnosis not present

## 2020-09-13 DIAGNOSIS — I129 Hypertensive chronic kidney disease with stage 1 through stage 4 chronic kidney disease, or unspecified chronic kidney disease: Secondary | ICD-10-CM | POA: Diagnosis not present

## 2020-09-18 ENCOUNTER — Other Ambulatory Visit: Payer: Self-pay

## 2020-09-18 ENCOUNTER — Encounter: Payer: Medicare Other | Attending: Internal Medicine | Admitting: Internal Medicine

## 2020-09-18 DIAGNOSIS — Z7901 Long term (current) use of anticoagulants: Secondary | ICD-10-CM | POA: Diagnosis not present

## 2020-09-18 DIAGNOSIS — L89610 Pressure ulcer of right heel, unstageable: Secondary | ICD-10-CM | POA: Insufficient documentation

## 2020-09-18 DIAGNOSIS — R296 Repeated falls: Secondary | ICD-10-CM | POA: Diagnosis not present

## 2020-09-18 DIAGNOSIS — Z09 Encounter for follow-up examination after completed treatment for conditions other than malignant neoplasm: Secondary | ICD-10-CM | POA: Insufficient documentation

## 2020-09-18 DIAGNOSIS — L89890 Pressure ulcer of other site, unstageable: Secondary | ICD-10-CM | POA: Diagnosis not present

## 2020-09-18 DIAGNOSIS — F0390 Unspecified dementia without behavioral disturbance: Secondary | ICD-10-CM | POA: Diagnosis not present

## 2020-09-18 NOTE — Progress Notes (Signed)
[name], [name] ([id number]) Visit Report for 09/18/2020 HPI Details [name], [name] Date of Service: 09/18/2020 11:00 AM Medical Record Number: [id number] Patient Account Number: [id number] Date of Birth/Sex: 1934-12-11 (85 y.o. M) Treating RN: [name] Primary Care Provider: Nobie Putnam Other Clinician: Referring Provider: Nobie Putnam Treating Provider/Extender: Tito Dine in Treatment: 15 History of Present Illness HPI Description: 06/03/2020 upon evaluation today patient actually presents for initial inspection here in our clinic after having gone to urgent care this morning where they were concerned about infection as well as a significant "softball sized wound with eschar" over the heel. With that being said the patient fortunately does not have an area that largely does have an area of eschar which is draining around the edges really not stable and I think we are getting to remove the eschar so we try to clean up the surface of the wound. Fortunately there is no evidence of active infection at this time. I do believe that he is going require some debridement both manually today with scissors and forceps as well as enzymatic/chemically with the medications. I really feel like that he has a good chance to get this to heal if he keep pressure off of it we did discuss that again today. He does note this has been present for about 2 months. He is not taking any of his current medications that we did put him on the list he tells me that he only occasionally takes them which is obviously not doing anything for him. He does have a cane but he does not use it and his ABI was 1.14 on the right. 06/10/2020 upon evaluation today patient appears to be doing well with regard to his wound all things considered. He still has a lot of necrotic tissue in the central portion of the wound. I think the Iodosorb is still the right thing to do and again the biggest  issue is he quit using it as he and his wife felt like that the drainage following putting on the medicine was really "nasty". Nonetheless I explained that the weight Iodoflex breaks down or Iodosorb and that round does make it look really bad but it actually is not as bad as what it seems. In fact it is the normal course of things. 06/24/2020 upon evaluation today patient appears to be doing unfortunately about the same in regard to his heel. I do not think this is any worse. However its become increasingly clear that he has significant dementia in my opinion. We have had multiple instances going on here from the standpoint of trying to get him to take the oral antibiotic. Initially we ordered it for him he picked it up on 1 afternoon and by the next morning we called to make sure that he had gotten it he told us yes but that he had thrown it away. That he was not to take any additional medications. We then told him he needed to be taken this to get the infection under control he subsequently look for it only found the bottle never took the medication therefore I sent it in a second time for him. After sending this and it looks like he has not really taken it since that time he tells me that he took 7 pills at once although I do not think they were all of the same medication and then following this it made him sick I think these were all of his different medicines. He  decided he was not to take any further medication after that point. Nonetheless I do believe he still is going require treatment for this infection the good news is he has a friend who is a very good friend that is with him here today to try to help with figuring out what to do and how going forward this should be taken care of. 07/01/2020 upon evaluation today patient appears to be doing really about the same. His measurements are maybe slightly smaller than previous. Fortunately there is no signs of active infection which is great news.  No fevers, chills, nausea, vomiting, or diarrhea. 07/08/2020 upon evaluation today patient appears to be doing better in regard to his wounds. He looks like he finally actually took the Levaquin which has helped with the infection. I think things are doing much better in that regard which is great news. No fevers, chills, nausea, vomiting, or diarrhea. Unfortunately he did have actual Kleenex/tissue paper over the wound on really not sure why his wife put that on there but nonetheless I think were ready to start wrapping this week. 07/15/2020 on evaluation today patient appears to be doing about the same in regard to his wound. Unfortunately there does not appear to be any significant improvement. With that being said he also came in today with the heel of his wrap completely hanging out where the wound was exposed and just rubbing on the bedroom slipper that he has on today. With that being said this is obviously not ideal. I discussed that with the patient I really think that he is getting need to be more careful and conscientious about what is going on with his wrap. With that being said some of this is to know followed on his own that he actually has in my opinion significant dementia which inhibits his ability to be able to comply. He has a friend with him today who is trying to help out as best he can I am just not certain that [name] is actually able to do what needs to be done here. 07/22/20 upon evaluation today patient appears to be doing well with regard to his wound is measuring a little bit smaller. With that being said he did not have any dressings on whatsoever upon evaluation today it was just gauze wrapped Curlex over his leg. Subsequently the Coban following this really was not the appropriate wrap and more importantly he had no dressing actually on the wound itself. Fortunately there does not appear to be any signs of active infection which is great news. Unfortunately my main concern  here is that the patient needs to have appropriate dressings on in order to prevent infection and allow for continued and appropriate healing. 07/29/2020 upon evaluation today patient appears to be doing okay in regard to his wound. Unfortunately still do not have the Hydrofera Blue in place in fact it appears that he had a wet-to-dry dressing again applied to the heel even after our conversation with the home health supervisor last week where they told us that that is what the nurse had put in place and that we have been contacted to advise [place] that that was being done. We had never been contacted in any such regard prior to that being done. At that point we advised that until they get the [facility] in they could use a silver alginate dressing. Nonetheless again today the patient has a wet-to-dry dressing only on he is very macerated around the edges he  is also very hyper granulated in regard to the wound bed both a direct result of inappropriate dressings being applied to this wound. That is a direct cause of issues going on with home health not following orders as prescribed by the clinic here. 08/05/2020 upon evaluation today patient appears to be doing excellent in regard to his wound. He has been tolerating the dressing changes without complication and overall very pleased with where things stand currently. With that being said the patient did not have the ABD pad in place and I really think he may actually be able to do better with XtraSorb even at this time. Nonetheless I think that if we can get this swelling under control and then turned the edema lessens is weeping then that will be a dramatic improvement. We can work on that currently. DHIREN, AZIMI ([id number]) 08/19/2020 upon evaluation today patient appears to be doing excellent in regard to his heel ulcer. He has been tolerating the dressing changes without complication. I do feel like he is making good progress which is great news.  There does not appear to be any signs of significant infection at this point at all which is also excellent. 08/26/2020 upon evaluation today patient appears to be doing excellent in regard to his heel ulcer. He is draining a lot but I think the big issue here is that he probably needs to be elevating his legs a lot more to help with controlling some of this edema. Fortunately there is no evidence of active infection at this time which is great news. No fevers, chills, nausea, vomiting, or diarrhea. 09/02/2020 upon evaluation today patient appears to be doing well with regard to his wound. He is measuring better and the wound is looking much smaller today. I am very pleased with where things stand. He still has a lot of drainage but he tells me he has been trying to elevate his legs as much as possible which I think is helping to some degree. 5/23; wound on the right lateral foot. Using Hydrofera Blue 3 layer compression. 6/1; right lateral foot. Although this looks very healthy it is only slightly improved in terms of width. He is not wearing any offloading. The wound is lateral but very well could be in the weightbearing area Electronic Signature(s) Signed: 09/18/2020 5:08:05 PM By: Linton Ham MD Entered By: Linton Ham on 09/18/2020 11:20:50 [name], [name] ([id number]) -------------------------------------------------------------------------------- Physical Exam Details [name] Date of Service: 09/18/2020 11:00 AM Medical Record Number: [id number] Patient Account Number: [id number] Date of Birth/Sex: 31-Oct-1934 (85 y.o. M) Treating RN: [name] Primary Care Provider: Nobie Putnam Other Clinician: Referring Provider: Nobie Putnam Treating Provider/Extender: Tito Dine in Treatment: 15 Constitutional Sitting or standing Blood Pressure is within target range for patient.. Pulse regular and within target range for patient.Marland Kitchen Respirations  regular, non- labored and within target range.. Temperature is normal and within the target range for the patient.Marland Kitchen appears in no distress. Notes Wound exam; right lateral calcaneus just above the weightbearing surface. The surface of the wound looks very healthy nice viable looking granulation. There has been an improvement in the width of the wound but not the length. There is nothing here to debride. There is no evidence of infection Electronic Signature(s) Signed: 09/18/2020 5:08:05 PM By: Linton Ham MD Entered By: Linton Ham on 09/18/2020 11:23:29 [name], [name] ([id number]) -------------------------------------------------------------------------------- Physician Orders Details [name] Date of Service: 09/18/2020 11:00 AM Medical Record Number:  [id number] Patient Account Number: [id number] Date of Birth/Sex: February 21, 1935 (86 y.o. M) Treating RN: Dolan Amen Primary Care Provider: Nobie Putnam Other Clinician: Referring Provider: Nobie Putnam Treating Provider/Extender: Tito Dine in Treatment: 15 Verbal / Phone Orders: No Diagnosis Coding Follow-up Appointments o Return Appointment in 1 week. [place] for wound care. May utilize formulary equivalent dressing for wound treatment orders unless otherwise specified. Home Health Nurse may visit PRN to address patientos wound care needs. - [facility] to change on Wednesdays and Fridays , patient to be seen in clinic on [place] Orders/Instructions: - Please [place] [facility] Edema Control - Lymphedema / Segmental Compressive Device / Other o Optional: One layer of unna paste to top of compression wrap (to act as an anchor). o Elevate, Exercise Daily and Avoid Standing for Long Periods of Time. - AVOID WALKING MORE THAN NEEDED o Elevate legs to the level of the  heart and pump ankles as often as possible o Elevate leg(s) parallel to the floor when sitting. Off-Loading o Open toe surgical shoe - Continue wearing on right foot Wound Treatment Wound #1 - Foot Wound Laterality: Right, Lateral Cleanser: Normal Saline (Generic) 2 x Per Week/30 Days Discharge Instructions: Wash your hands with soap and water. Remove old dressing, discard into plastic bag and place into trash. Cleanse the wound with Normal Saline prior to applying a clean dressing using gauze sponges, not tissues or cotton balls. Do not scrub or use excessive force. Pat dry using gauze sponges, not tissue or cotton balls. Primary Dressing: Hydrofera Blue Ready Transfer Foam, 2.5x2.5 (in/in) (Generic) 2 x Per Week/30 Days Discharge Instructions: Apply Hydrofera Blue Ready to wound bed as directed Secondary Dressing: ABD Pad 5x9 (in/in) 2 x Per Week/30 Days Discharge Instructions: Cover with ABD pad Compression Wrap: Profore Lite LF 3 Multilayer Compression Bandaging System (Generic) 2 x Per Week/30 Days Discharge Instructions: Apply 3 multi-layer wrap as prescribed. Electronic Signature(s) Signed: 09/18/2020 4:52:16 PM By: Georges Mouse, Minus Breeding RN Signed: 09/18/2020 5:08:05 PM By: Linton Ham MD Entered By: Georges Mouse, Minus Breeding on 09/18/2020 11:08:05 [name], [name] ([id number]) -------------------------------------------------------------------------------- Problem List Details Patient Name: KARLA, VINES. Date of Service: 09/18/2020 11:00 AM Medical Record Number: [id number] Patient Account Number: [id number] Date of Birth/Sex: 10/03/1934 (85 y.o. M) Treating RN: [name] Primary Care Provider: Nobie Putnam Other Clinician: Referring Provider: Nobie Putnam Treating Provider/Extender: Tito Dine in Treatment: 15 Active Problems ICD-10 Encounter Code Description Active Date MDM Diagnosis L89.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, unstageable  06/03/2020 No Yes R29.6 Repeated falls 06/03/2020 No Yes Z79.01 Long term (current) use of anticoagulants 06/03/2020 No Yes Inactive Problems Resolved Problems Electronic Signature(s) Signed: 09/18/2020 5:08:05 PM By: Linton Ham MD Entered By: Linton Ham on 09/18/2020 11:19:34 [name], [name] ([id number]) -------------------------------------------------------------------------------- Progress Note Details [name] Date of Service: 09/18/2020 11:00 AM Medical Record Number: [id number] Patient Account Number: [id number] Date of Birth/Sex: 1934/10/15 (85 y.o. M) Treating RN: [name] Primary Care Provider: Nobie Putnam Other Clinician: Referring Provider: Nobie Putnam Treating Provider/Extender: Tito Dine in Treatment: 15 Subjective History of Present Illness (HPI) 06/03/2020 upon evaluation today patient actually presents for initial inspection here in our clinic after having gone to urgent care this morning where they were concerned about infection as well as a significant "softball sized wound with eschar" over the heel. With that being said  the patient fortunately does not have an area that largely does have an area of eschar which is draining around the edges really not stable and I think we are getting to remove the eschar so we try to clean up the surface of the wound. Fortunately there is no evidence of active infection at this time. I do believe that he is going require some debridement both manually today with scissors and forceps as well as enzymatic/chemically with the medications. I really feel like that he has a good chance to get this to heal if he keep pressure off of it we did discuss that again today. He does note this has been present for about 2 months. He is not taking any of his current medications that we did put him on the list he tells me that he only occasionally takes them which is obviously not doing anything  for him. He does have a cane but he does not use it and his ABI was 1.14 on the right. 06/10/2020 upon evaluation today patient appears to be doing well with regard to his wound all things considered. He still has a lot of necrotic tissue in the central portion of the wound. I think the Iodosorb is still the right thing to do and again the biggest issue is he quit using it as he and his wife felt like that the drainage following putting on the medicine was really "nasty". Nonetheless I explained that the weight Iodoflex breaks down or Iodosorb and that round does make it look really bad but it actually is not as bad as what it seems. In fact it is the normal course of things. 06/24/2020 upon evaluation today patient appears to be doing unfortunately about the same in regard to his heel. I do not think this is any worse. However its become increasingly clear that he has significant dementia in my opinion. We have had multiple instances going on here from the standpoint of trying to get him to take the oral antibiotic. Initially we ordered it for him he picked it up on 1 afternoon and by the next morning we called to make sure that he had gotten it he told us yes but that he had thrown it away. That he was not to take any additional medications. We then told him he needed to be taken this to get the infection under control he subsequently look for it only found the bottle never took the medication therefore I sent it in a second time for him. After sending this and it looks like he has not really taken it since that time he tells me that he took 7 pills at once although I do not think they were all of the same medication and then following this it made him sick I think these were all of his different medicines. He decided he was not to take any further medication after that point. Nonetheless I do believe he still is going require treatment for this infection the good news is he has a friend who is a very  good friend that is with him here today to try to help with figuring out what to do and how going forward this should be taken care of. 07/01/2020 upon evaluation today patient appears to be doing really about the same. His measurements are maybe slightly smaller than previous. Fortunately there is no signs of active infection which is great news. No fevers, chills, nausea, vomiting, or diarrhea. 07/08/2020 upon evaluation today patient  appears to be doing better in regard to his wounds. He looks like he finally actually took the Levaquin which has helped with the infection. I think things are doing much better in that regard which is great news. No fevers, chills, nausea, vomiting, or diarrhea. Unfortunately he did have actual Kleenex/tissue paper over the wound on really not sure why his wife put that on there but nonetheless I think were ready to start wrapping this week. 07/15/2020 on evaluation today patient appears to be doing about the same in regard to his wound. Unfortunately there does not appear to be any significant improvement. With that being said he also came in today with the heel of his wrap completely hanging out where the wound was exposed and just rubbing on the bedroom slipper that he has on today. With that being said this is obviously not ideal. I discussed that with the patient I really think that he is getting need to be more careful and conscientious about what is going on with his wrap. With that being said some of this is to know followed on his own that he actually has in my opinion significant dementia which inhibits his ability to be able to comply. He has a friend with him today who is trying to help out as best he can I am just not certain that [name] is actually able to do what needs to be done here. 07/22/20 upon evaluation today patient appears to be doing well with regard to his wound is measuring a little bit smaller. With that being said he did not have any  dressings on whatsoever upon evaluation today it was just gauze wrapped Curlex over his leg. Subsequently the Coban following this really was not the appropriate wrap and more importantly he had no dressing actually on the wound itself. Fortunately there does not appear to be any signs of active infection which is great news. Unfortunately my main concern here is that the patient needs to have appropriate dressings on in order to prevent infection and allow for continued and appropriate healing. 07/29/2020 upon evaluation today patient appears to be doing okay in regard to his wound. Unfortunately still do not have the Hydrofera Blue in place in fact it appears that he had a wet-to-dry dressing again applied to the heel even after our conversation with the home health supervisor last week where they told us that that is what the nurse had put in place and that we have been contacted to advise [place] that that was being done. We had never been contacted in any such regard prior to that being done. At that point we advised that until they get the [facility] in they could use a silver alginate dressing. Nonetheless again today the patient has a wet-to-dry dressing only on he is very macerated around the edges he is also very hyper granulated in regard to the wound bed both a direct result of inappropriate dressings being applied to this wound. That is a direct cause of issues going on with home health not following orders as prescribed by the clinic here. 08/05/2020 upon evaluation today patient appears to be doing excellent in regard to his wound. He has been tolerating the dressing changes without complication and overall very pleased with where things stand currently. With that being said the patient did not have the ABD pad in place and I really think he may actually be able to do better with XtraSorb even at this time. Nonetheless I  think that if we can get this swelling under control and then turned  the edema lessens is weeping then that will be a dramatic improvement. We can work on that currently. 08/19/2020 upon evaluation today patient appears to be doing excellent in regard to his heel ulcer. He has been tolerating the dressing changes without complication. I do feel like he is making good progress which is great news. There does not appear to be any signs of significant infection at this point at all which is also excellent. [name], [name] ([id number]) 08/26/2020 upon evaluation today patient appears to be doing excellent in regard to his heel ulcer. He is draining a lot but I think the big issue here is that he probably needs to be elevating his legs a lot more to help with controlling some of this edema. Fortunately there is no evidence of active infection at this time which is great news. No fevers, chills, nausea, vomiting, or diarrhea. 09/02/2020 upon evaluation today patient appears to be doing well with regard to his wound. He is measuring better and the wound is looking much smaller today. I am very pleased with where things stand. He still has a lot of drainage but he tells me he has been trying to elevate his legs as much as possible which I think is helping to some degree. 5/23; wound on the right lateral foot. Using Hydrofera Blue 3 layer compression. 6/1; right lateral foot. Although this looks very healthy it is only slightly improved in terms of width. He is not wearing any offloading. The wound is lateral but very well could be in the weightbearing area Objective Constitutional Sitting or standing Blood Pressure is within target range for patient.. Pulse regular and within target range for patient.Marland Kitchen Respirations regular, non- labored and within target range.. Temperature is normal and within the target range for the patient.Marland Kitchen appears in no distress. Vitals Time Taken: 10:49 AM, Height: 71 in, Weight: 216 lbs, BMI: 30.1, Temperature: 97.9 F, Pulse: 70 bpm, Respiratory Rate: 18  breaths/min, Blood Pressure: 146/82 mmHg. General Notes: Wound exam; right lateral calcaneus just above the weightbearing surface. The surface of the wound looks very healthy nice viable looking granulation. There has been an improvement in the width of the wound but not the length. There is nothing here to debride. There is no evidence of infection Integumentary (Hair, Skin) Wound #1 status is Open. Original cause of wound was Pressure Injury. The date acquired was: 04/03/2020. The wound has been in treatment 15 weeks. The wound is located on the Right,Lateral Foot. The wound measures 1.5cm length x 1.2cm width x 0.2cm depth; 1.414cm^2 area and 0.283cm^3 volume. There is Fat Layer (Subcutaneous Tissue) exposed. There is no tunneling or undermining noted. There is a large amount of serosanguineous drainage noted. The wound margin is thickened. There is large (67-100%) red, hyper - granulation within the wound bed. There is a small (1-33%) amount of necrotic tissue within the wound bed including Adherent Slough. Assessment Active Problems ICD-10 Pressure ulcer of right heel, unstageable Repeated falls Long term (current) use of anticoagulants Procedures Wound #1 Pre-procedure diagnosis of Wound #1 is a Pressure Ulcer located on the Right,Lateral Foot . There was a Three Layer Compression Therapy Procedure with a pre-treatment ABI of 1.1 by Dolan Amen, RN. Post procedure Diagnosis Wound #1: Same as Pre-Procedure Plan Follow-up Appointments: [name], [name] ([id number]) Return Appointment in 1 week. Home Health: [facility] for wound care. May utilize formulary equivalent dressing for  wound treatment orders unless otherwise specified. Home Health Nurse may visit PRN to address patient s wound care needs. - [facility] to change on Wednesdays and Fridays , patient to be seen in clinic on [facility] Orders/Instructions: - Please [place] [facility] Edema Control - Lymphedema / Segmental Compressive Device / Other: Optional: One layer of unna paste to top of compression wrap (to act as an anchor). Elevate, Exercise Daily and Avoid Standing for Long Periods of Time. - AVOID WALKING MORE THAN NEEDED Elevate legs to the level of the heart and pump ankles as often as possible Elevate leg(s) parallel to the floor when sitting. Off-Loading: Open toe surgical shoe - Continue wearing on right foot WOUND #1: - Foot Wound Laterality: Right, Lateral Cleanser: Normal Saline (Generic) 2 x Per Week/30 Days Discharge Instructions: Wash your hands with soap and water. Remove old dressing, discard into plastic bag and place into trash. Cleanse the wound with Normal Saline prior to applying a clean dressing using gauze sponges, not tissues or cotton balls. Do not scrub or use excessive force. Pat dry using gauze sponges, not tissue or cotton balls. Primary Dressing: Hydrofera Blue Ready Transfer Foam, 2.5x2.5 (in/in) (Generic) 2 x Per Week/30 Days Discharge Instructions: Apply Hydrofera Blue Ready to wound bed as directed Secondary Dressing: ABD Pad 5x9 (in/in) 2 x Per Week/30 Days Discharge Instructions: Cover with ABD pad Compression Wrap: Profore Lite LF 3 Multilayer Compression Bandaging System (Generic) 2 x Per Week/30 Days Discharge Instructions: Apply 3 multi-layer wrap as prescribed. 1. I have encouraged him to use the surgical sandal we gave him although there are balance issues. He also brought up driving having difficulties in the sandal however he could change to his regular shoes and I have told him this. 2. I cannot see any reason why this will not close except excessive pressure. This is not totally on the plantar aspect but it is close just above the edge of his foot especially if he rotates when he walks Electronic Signature(s) Signed: 09/18/2020 5:08:05 PM By: Linton Ham MD Entered By:  Linton Ham on 09/18/2020 11:24:53 Kapral, [name] ([id number]) -------------------------------------------------------------------------------- SuperBill Details [name] Date of Service: 09/18/2020 Medical Record Number: [id number] Patient Account Number: [id number] Date of Birth/Sex: 1934/11/17 (86 y.o. M) Treating RN: Dolan Amen Primary Care Provider: Nobie Putnam Other Clinician: Referring Provider: Nobie Putnam Treating Provider/Extender: Tito Dine in Treatment: 15 Diagnosis Coding ICD-10 Codes Code Description L89.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, unstageable R29.6 Repeated falls Z79.01 Long term (current) use of anticoagulants Facility Procedures CPT4 Code: 32122482 Description: (Facility Use Only) [phone] - [place] RT LEG Modifier: Quantity: 1 Physician Procedures CPT4 Code: 8889169 Description: 45038 - WC PHYS LEVEL 3 - EST PT Modifier: Quantity: 1 CPT4 Code: Description: ICD-10 Diagnosis Description L89.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, unstageable Modifier: Quantity: Electronic Signature(s) Signed: 09/18/2020 5:08:05 PM By: Linton Ham MD Entered By: Linton Ham on 09/18/2020 11:25:14

## 2020-09-19 NOTE — Progress Notes (Signed)
KALIL, WOESSNER ([id number]) Visit Report for 09/18/2020 Arrival Information Details [name], [name] Date of Service: 09/18/2020 11:00 AM Medical Record Number: [id number] Patient Account Number: [id number] Date of Birth/Sex: Oct 03, 1934 (85 y.o. M) Treating RN: Donnamarie Poag Primary Care [name]: Nobie Putnam Other Clinician: Referring Lindsey Demonte: Nobie Putnam Treating Evaluna Utke/Extender: Tito Dine in Treatment: 15 Visit Information History Since Last Visit Added or deleted any medications: No Patient Arrived: Ambulatory Had a fall or experienced change in No Arrival Time: 10:45 activities of daily living that may affect Accompanied By: self risk of falls: Transfer Assistance: None Hospitalized since last visit: No Patient Identification Verified: Yes Has Dressing in Place as Prescribed: Yes Secondary Verification Process Completed: Yes Pain Present Now: No Patient Requires Transmission-Based Precautions: No Patient Has Alerts: Yes Patient Alerts: Not Diabetic Plavix Electronic Signature(s) Signed: 09/19/2020 9:13:32 AM By: Donnamarie Poag Entered By: Donnamarie Poag on 09/18/2020 10:46:08 Witczak, [name] ([id number]) -------------------------------------------------------------------------------- Clinic Level of Care Assessment Details [name] Date of Service: 09/18/2020 11:00 AM Medical Record Number: [id number] Patient Account Number: [id number] Date of Birth/Sex: [birth year] (85 y.o. M) Treating RN: Dolan Amen Primary Care Lekisha Mcghee: Nobie Putnam Other Clinician: Referring [name]: Nobie Putnam Treating [name]/Extender: Tito Dine in Treatment: 15 Clinic Level of Care Assessment Items TOOL 1 Quantity Score '[]'  - Use when EandM and Procedure is performed on INITIAL visit 0 ASSESSMENTS - Nursing Assessment / Reassessment '[]'  - General Physical Exam (combine w/ comprehensive assessment  (listed just below) when performed on new 0 pt. evals) '[]'  - 0 Comprehensive Assessment (HX, ROS, Risk Assessments, Wounds Hx, etc.) ASSESSMENTS - Wound and Skin Assessment / Reassessment '[]'  - Dermatologic / Skin Assessment (not related to wound area) 0 ASSESSMENTS - Ostomy and/or Continence Assessment and Care '[]'  - Incontinence Assessment and Management 0 '[]'  - 0 Ostomy Care Assessment and Management (repouching, etc.) PROCESS - Coordination of Care '[]'  - Simple Patient / Family Education for ongoing care 0 '[]'  - 0 Complex (extensive) Patient / Family Education for ongoing care '[]'  - 0 Staff obtains Programmer, systems, Records, Test Results / Process Orders '[]'  - 0 Staff telephones HHA, Nursing Homes / Clarify orders / etc '[]'  - 0 Routine Transfer to another Facility (non-emergent condition) '[]'  - 0 Routine Hospital Admission (non-emergent condition) '[]'  - 0 New Admissions / Biomedical engineer / Ordering NPWT, Apligraf, etc. '[]'  - 0 Emergency Hospital Admission (emergent condition) PROCESS - Special Needs '[]'  - Pediatric / Minor Patient Management 0 '[]'  - 0 Isolation Patient Management '[]'  - 0 Hearing / Language / Visual special needs '[]'  - 0 Assessment of Community assistance (transportation, D/C planning, etc.) '[]'  - 0 Additional assistance / Altered mentation '[]'  - 0 Support Surface(s) Assessment (bed, cushion, seat, etc.) INTERVENTIONS - Miscellaneous '[]'  - External ear exam 0 '[]'  - 0 Patient Transfer (multiple staff / Civil Service fast streamer / Similar devices) '[]'  - 0 Simple Staple / Suture removal (25 or less) '[]'  - 0 Complex Staple / Suture removal (26 or more) '[]'  - 0 Hypo/Hyperglycemic Management (do not check if billed separately) '[]'  - 0 Ankle / Brachial Index (ABI) - do not check if billed separately Has the patient been seen at the hospital within the last three years: Yes Total Score: 0 Level Of Care: ____ [name] ([id number]) Electronic Signature(s) Signed: 09/18/2020 4:52:16 PM  By: Georges Mouse, Minus Breeding RN Entered By: Georges Mouse, Minus Breeding on 09/18/2020 11:08:12 Erhard, [name] ([id number]) -------------------------------------------------------------------------------- Compression Therapy Details [name],  Jonathan D. Date of Service: 09/18/2020 11:00 AM Medical Record Number: [id number] Patient Account Number: [id number] Date of Birth/Sex: 04/19/1935 (85 y.o. M) Treating RN: Dolan Amen Primary Care [name]: Nobie Putnam Other Clinician: Referring Chanc Kervin: Nobie Putnam Treating [name]/Extender: Tito Dine in Treatment: 15 Compression Therapy Performed for Wound Assessment: Wound #1 Right,Lateral Foot Performed By: Cora Daniels, RN Compression Type: Three Layer Pre Treatment ABI: 1.1 Post Procedure Diagnosis Same as Pre-procedure Electronic Signature(s) Signed: 09/18/2020 4:52:16 PM By: Georges Mouse, Minus Breeding RN Entered By: Georges Mouse, Minus Breeding on 09/18/2020 11:06:55 Clawson, [name] ([id number]) -------------------------------------------------------------------------------- Encounter Discharge Information Details [name] Date of Service: 09/18/2020 11:00 AM Medical Record Number: [id number] Patient Account Number: [id number] Date of Birth/Sex: June 23, 1934 (85 y.o. M) Treating RN: Carlene Coria Primary Care Ledarrius Beauchaine: Nobie Putnam Other Clinician: Referring Jaylynn Siefert: Nobie Putnam Treating Laina Nguyen/Extender: Tito Dine in Treatment: 15 Encounter Discharge Information Items Discharge Condition: Stable Ambulatory Status: Ambulatory Discharge Destination: Home Transportation: Private Auto Accompanied By: self Schedule Follow-up Appointment: Yes Clinical Summary of Care: Patient Declined Electronic Signature(s) Signed: 09/19/2020 4:50:12 PM By: Carlene Coria RN Entered By: Carlene Coria on 09/18/2020 11:24:55 Hartsock, Amond Keturah Barre  ([id number]) -------------------------------------------------------------------------------- Lower Extremity Assessment Details [name] Date of Service: 09/18/2020 11:00 AM Medical Record Number: [id number] Patient Account Number: [id number] Date of Birth/Sex: 1934/11/24 (85 y.o. M) Treating RN: Donnamarie Poag Primary Care Kevis Qu: Nobie Putnam Other Clinician: Referring Kei Langhorst: Nobie Putnam Treating Tiffany Calmes/Extender: Tito Dine in Treatment: 15 Edema Assessment Assessed: [Left: No] Patrice Paradise: Yes] [Left: Edema] [Right: :] Calf Left: Right: Point of Measurement: 36 cm From Medial Instep 36 cm Ankle Left: Right: Point of Measurement: 12 cm From Medial Instep 23 cm Knee To Floor Left: Right: From Medial Instep 45 cm Vascular Assessment Pulses: Dorsalis Pedis Palpable: [Right:Yes] Electronic Signature(s) Signed: 09/19/2020 9:13:32 AM By: Donnamarie Poag Entered By: Donnamarie Poag on 09/18/2020 10:54:35 Lodato, Kindrick D. ([id number]) -------------------------------------------------------------------------------- Multi Wound Chart Details Patient Name: [name] Date of Service: 09/18/2020 11:00 AM Medical Record Number: [id number] Patient Account Number: [id number] Date of Birth/Sex: 04/08/1935 (85 y.o. M) Treating RN: Dolan Amen Primary Care Tanishka Drolet: Nobie Putnam Other Clinician: Referring Karlin Heilman: Nobie Putnam Treating Kaydyn Sayas/Extender: Tito Dine in Treatment: 15 Vital Signs Height(in): 71 Pulse(bpm): 70 Weight(lbs): 216 Blood Pressure(mmHg): 146/82 Body Mass Index(BMI): 30 Temperature(F): 97.9 Respiratory Rate(breaths/min): 18 Photos: [N/A:N/A] Wound Location: Right, Lateral Foot N/A N/A Wounding Event: Pressure Injury N/A N/A Primary Etiology: Pressure Ulcer N/A N/A Comorbid History: Cataracts, Hypertension N/A N/A Date Acquired: 04/03/2020 N/A N/A Weeks of Treatment: 15 N/A  N/A Wound Status: Open N/A N/A Measurements L x W x D (cm) 1.5x1.2x0.2 N/A N/A Area (cm) : 1.414 N/A N/A Volume (cm) : 0.283 N/A N/A % Reduction in Area: 93.70% N/A N/A % Reduction in Volume: 97.50% N/A N/A Classification: Unstageable/Unclassified N/A N/A Exudate Amount: Large N/A N/A Exudate Type: Serosanguineous N/A N/A Exudate Color: red, brown N/A N/A Wound Margin: Thickened N/A N/A Granulation Amount: Large (67-100%) N/A N/A Granulation Quality: Red, Hyper-granulation N/A N/A Necrotic Amount: Small (1-33%) N/A N/A Exposed Structures: Fat Layer (Subcutaneous Tissue): N/A N/A Yes Fascia: No Tendon: No Muscle: No Joint: No Bone: No Epithelialization: Small (1-33%) N/A N/A Procedures Performed: Compression Therapy N/A N/A Treatment Notes Electronic Signature(s) Signed: 09/18/2020 5:08:05 PM By: Linton Ham MD Entered By: Linton Ham on 09/18/2020 11:19:41 Preziosi, [name] ([id number]) -------------------------------------------------------------------------------- Multi-Disciplinary Care Plan Details [name] Date of Service: 09/18/2020  11:00 AM Medical Record Number: [id number] Patient Account Number: [id number] Date of Birth/Sex: 07-07-1934 (85 y.o. M) Treating RN: Dolan Amen Primary Care Bedelia Pong: Nobie Putnam Other Clinician: Referring Josilynn Losh: Nobie Putnam Treating Mikita Lesmeister/Extender: Tito Dine in Treatment: 15 Active Inactive Wound/Skin Impairment Nursing Diagnoses: Knowledge deficit related to ulceration/compromised skin integrity Goals: Patient/caregiver will verbalize understanding of skin care regimen Date Initiated: 06/03/2020 Date Inactivated: 08/20/2020 Target Resolution Date: 08/31/2020 Goal Status: Met Ulcer/skin breakdown will have a volume reduction of 30% by week 4 Date Initiated: 06/03/2020 Date Inactivated: 07/01/2020 Target Resolution Date: 07/01/2020 Goal Status: Met Ulcer/skin breakdown  will have a volume reduction of 50% by week 8 Date Initiated: 07/01/2020 Date Inactivated: 08/05/2020 Target Resolution Date: 08/01/2020 Goal Status: Met Ulcer/skin breakdown will have a volume reduction of 80% by week 12 Date Initiated: 07/01/2020 Date Inactivated: 09/02/2020 Target Resolution Date: 08/31/2020 Goal Status: Met Ulcer/skin breakdown will heal within 14 weeks Date Initiated: 07/01/2020 Target Resolution Date: 10/01/2020 Goal Status: Active Interventions: Assess patient/caregiver ability to obtain necessary supplies Assess patient/caregiver ability to perform ulcer/skin care regimen upon admission and as needed Assess ulceration(s) every visit Notes: Electronic Signature(s) Signed: 09/18/2020 4:52:16 PM By: Georges Mouse, Minus Breeding RN Entered By: Georges Mouse, Minus Breeding on 09/18/2020 11:06:31 Hausman, [name] ([id number]) -------------------------------------------------------------------------------- Pain Assessment Details [name] Date of Service: 09/18/2020 11:00 AM Medical Record Number: [id number] Patient Account Number: [id number] Date of Birth/Sex: 03-Jun-1934 (85 y.o. M) Treating RN: Donnamarie Poag Primary Care Reilly Molchan: Nobie Putnam Other Clinician: Referring Cadell Gabrielson: Nobie Putnam Treating Lora Glomski/Extender: Tito Dine in Treatment: 15 Active Problems Location of Pain Severity and Description of Pain Patient Has Paino No Site Locations Rate the pain. Current Pain Level: 0 Pain Management and Medication Current Pain Management: Electronic Signature(s) Signed: 09/19/2020 9:13:32 AM By: Donnamarie Poag Entered By: Donnamarie Poag on 09/18/2020 10:47:54 Pearce, [name] ([id number]) -------------------------------------------------------------------------------- Patient/Caregiver Education Details [name] Date of Service: 09/18/2020 11:00 AM Medical Record Number: [id number] Patient Account Number:  [id number] Date of Birth/Gender: [birth year] (85 y.o. M) Treating RN: Dolan Amen Primary Care Physician: Nobie Putnam Other Clinician: Referring Physician: Nobie Putnam Treating Physician/Extender: Tito Dine in Treatment: 15 Education Assessment Education Provided To: Patient Education Topics Provided Offloading: Methods: Explain/Verbal Responses: State content correctly Wound/Skin Impairment: Methods: Explain/Verbal Responses: State content correctly Electronic Signature(s) Signed: 09/18/2020 4:52:16 PM By: Georges Mouse, Minus Breeding RN Entered By: Georges Mouse, Minus Breeding on 09/18/2020 11:08:31 Vassar, [name] ([id number]) -------------------------------------------------------------------------------- Wound Assessment Details [name] Date of Service: 09/18/2020 11:00 AM Medical Record Number: [id number] Patient Account Number: [id number] Date of Birth/Sex: 1934-10-22 (85 y.o. M) Treating RN: Donnamarie Poag Primary Care Amaya Blakeman: Nobie Putnam Other Clinician: Referring Chrisha Vogel: Nobie Putnam Treating Kyrel Leighton/Extender: Tito Dine in Treatment: 15 Wound Status Wound Number: 1 Primary Etiology: Pressure Ulcer Wound Location: Right, Lateral Foot Wound Status: Open Wounding Event: Pressure Injury Comorbid History: Cataracts, Hypertension Date Acquired: 04/03/2020 Weeks Of Treatment: 15 Clustered Wound: No Photos Wound Measurements Length: (cm) 1.5 Width: (cm) 1.2 Depth: (cm) 0.2 Area: (cm) 1.414 Volume: (cm) 0.283 % Reduction in Area: 93.7% % Reduction in Volume: 97.5% Epithelialization: Small (1-33%) Tunneling: No Undermining: No Wound Description Classification: Unstageable/Unclassified Wound Margin: Thickened Exudate Amount: Large Exudate Type: Serosanguineous Exudate Color: red, brown Foul Odor After Cleansing: No Slough/Fibrino Yes Wound Bed Granulation Amount: Large (67-100%)  Exposed Structure Granulation Quality: Red, Hyper-granulation Fascia Exposed: No Necrotic Amount: Small (1-33%) Fat Layer (Subcutaneous Tissue) Exposed: Yes Necrotic Quality:  Adherent Slough Tendon Exposed: No Muscle Exposed: No Joint Exposed: No Bone Exposed: No Treatment Notes Wound #1 (Foot) Wound Laterality: Right, Lateral Cleanser Normal Saline Discharge Instruction: Wash your hands with soap and water. Remove old dressing, discard into plastic bag and place into trash. Cleanse the wound with Normal Saline prior to applying a clean dressing using gauze sponges, not tissues or cotton balls. Do not scrub or use excessive force. Pat dry using gauze sponges, not tissue or cotton balls. JURIS, GOSNELL ([id number]) Peri-Wound Care Topical Primary Dressing Hydrofera Blue Ready Transfer Foam, 2.5x2.5 (in/in) Discharge Instruction: Apply Hydrofera Blue Ready to wound bed as directed Secondary Dressing ABD Pad 5x9 (in/in) Discharge Instruction: Cover with ABD pad Secured With Compression Wrap Profore Lite LF 3 Multilayer Compression Reserve Discharge Instruction: Apply 3 multi-layer wrap as prescribed. Compression Stockings Add-Ons Electronic Signature(s) Signed: 09/19/2020 9:13:32 AM By: Donnamarie Poag Entered By: Donnamarie Poag on 09/18/2020 10:53:16 Sarra, [name] ([id number]) -------------------------------------------------------------------------------- Vitals Details [name] Date of Service: 09/18/2020 11:00 AM Medical Record Number: [id number] Patient Account Number: [id number] Date of Birth/Sex: 10-08-1934 (85 y.o. M) Treating RN: Donnamarie Poag Primary Care Rosali Augello: Nobie Putnam Other Clinician: Referring Kacyn Souder: Nobie Putnam Treating Qusay Villada/Extender: Tito Dine in Treatment: 15 Vital Signs Time Taken: 10:49 Temperature (F): 97.9 Height (in): 71 Pulse (bpm): 70 Weight (lbs): 216 Respiratory Rate  (breaths/min): 18 Body Mass Index (BMI): 30.1 Blood Pressure (mmHg): 146/82 Reference Range: 80 - 120 mg / dl Electronic Signature(s) Signed: 09/19/2020 9:13:32 AM By: Donnamarie Poag Entered ByDonnamarie Poag on 09/18/2020 10:47:45

## 2020-09-20 DIAGNOSIS — R300 Dysuria: Secondary | ICD-10-CM | POA: Diagnosis not present

## 2020-09-20 DIAGNOSIS — I129 Hypertensive chronic kidney disease with stage 1 through stage 4 chronic kidney disease, or unspecified chronic kidney disease: Secondary | ICD-10-CM | POA: Diagnosis not present

## 2020-09-20 DIAGNOSIS — N183 Chronic kidney disease, stage 3 unspecified: Secondary | ICD-10-CM | POA: Diagnosis not present

## 2020-09-20 DIAGNOSIS — L89616 Pressure-induced deep tissue damage of right heel: Secondary | ICD-10-CM | POA: Diagnosis not present

## 2020-09-20 DIAGNOSIS — I69315 Cognitive social or emotional deficit following cerebral infarction: Secondary | ICD-10-CM | POA: Diagnosis not present

## 2020-09-20 DIAGNOSIS — E785 Hyperlipidemia, unspecified: Secondary | ICD-10-CM | POA: Diagnosis not present

## 2020-09-23 ENCOUNTER — Other Ambulatory Visit: Payer: Self-pay

## 2020-09-23 ENCOUNTER — Ambulatory Visit (INDEPENDENT_AMBULATORY_CARE_PROVIDER_SITE_OTHER): Payer: Medicare Other | Admitting: Pharmacist

## 2020-09-23 ENCOUNTER — Encounter: Payer: Medicare Other | Admitting: Physician Assistant

## 2020-09-23 DIAGNOSIS — R296 Repeated falls: Secondary | ICD-10-CM | POA: Diagnosis not present

## 2020-09-23 DIAGNOSIS — I10 Essential (primary) hypertension: Secondary | ICD-10-CM | POA: Diagnosis not present

## 2020-09-23 DIAGNOSIS — L89610 Pressure ulcer of right heel, unstageable: Secondary | ICD-10-CM | POA: Diagnosis not present

## 2020-09-23 DIAGNOSIS — Z7901 Long term (current) use of anticoagulants: Secondary | ICD-10-CM | POA: Diagnosis not present

## 2020-09-23 DIAGNOSIS — E782 Mixed hyperlipidemia: Secondary | ICD-10-CM | POA: Diagnosis not present

## 2020-09-23 DIAGNOSIS — Z09 Encounter for follow-up examination after completed treatment for conditions other than malignant neoplasm: Secondary | ICD-10-CM | POA: Diagnosis not present

## 2020-09-23 DIAGNOSIS — L89890 Pressure ulcer of other site, unstageable: Secondary | ICD-10-CM | POA: Diagnosis not present

## 2020-09-23 NOTE — Progress Notes (Addendum)
[name] ([id number]) Visit Report for 09/23/2020 Chief Complaint Document Details [name], [name] Date of Service: 09/23/2020 11:00 AM Medical Record Number: [id number] Patient Account Number: [id number] Date of Birth/Sex: Apr 09, 1935 (86 y.o. M) Treating RN: Donnamarie Poag Primary Care Provider: Nobie Putnam Other Clinician: Referring Provider: Nobie Putnam Treating Provider/Extender: Skipper Cliche in Treatment: 16 Information Obtained from: Patient Chief Complaint Right heel ulcer Electronic Signature(s) Signed: 09/23/2020 11:25:03 AM By: Worthy Keeler PA-C Entered By: Worthy Keeler on 09/23/2020 11:25:02 Dalby, [name] ([id number]) -------------------------------------------------------------------------------- HPI Details [name] Date of Service: 09/23/2020 11:00 AM Medical Record Number: [id number] Patient Account Number: [id number] Date of Birth/Sex: 10-30-1934 (86 y.o. M) Treating RN: Donnamarie Poag Primary Care Provider: Nobie Putnam Other Clinician: Referring Provider: Nobie Putnam Treating Provider/Extender: Skipper Cliche in Treatment: 16 History of Present Illness HPI Description: 06/03/2020 upon evaluation today patient actually presents for initial inspection here in our clinic after having gone to urgent care this morning where they were concerned about infection as well as a significant "softball sized wound with eschar" over the heel. With that being said the patient fortunately does not have an area that largely does have an area of eschar which is draining around the edges really not stable and I think we are getting to remove the eschar so we try to clean up the surface of the wound. Fortunately there is no evidence of active infection at this time. I do believe that he is going require some debridement both manually today with scissors and forceps as well as enzymatic/chemically with the  medications. I really feel like that he has a good chance to get this to heal if he keep pressure off of it we did discuss that again today. He does note this has been present for about 2 months. He is not taking any of his current medications that we did put him on the list he tells me that he only occasionally takes them which is obviously not doing anything for him. He does have a cane but he does not use it and his ABI was 1.14 on the right. 06/10/2020 upon evaluation today patient appears to be doing well with regard to his wound all things considered. He still has a lot of necrotic tissue in the central portion of the wound. I think the Iodosorb is still the right thing to do and again the biggest issue is he quit using it as he and his wife felt like that the drainage following putting on the medicine was really "nasty". Nonetheless I explained that the weight Iodoflex breaks down or Iodosorb and that round does make it look really bad but it actually is not as bad as what it seems. In fact it is the normal course of things. 06/24/2020 upon evaluation today patient appears to be doing unfortunately about the same in regard to his heel. I do not think this is any worse. However its become increasingly clear that he has significant dementia in my opinion. We have had multiple instances going on here from the standpoint of trying to get him to take the oral antibiotic. Initially we ordered it for him he picked it up on 1 afternoon and by the next morning we called to make sure that he had gotten it he told us yes but that he had thrown it away. That he was not to take any additional medications. We then told him he needed to be taken this  to get the infection under control he subsequently look for it only found the bottle never took the medication therefore I sent it in a second time for him. After sending this and it looks like he has not really taken it since that time he tells me that he took 7  pills at once although I do not think they were all of the same medication and then following this it made him sick I think these were all of his different medicines. He decided he was not to take any further medication after that point. Nonetheless I do believe he still is going require treatment for this infection the good news is he has a friend who is a very good friend that is with him here today to try to help with figuring out what to do and how going forward this should be taken care of. 07/01/2020 upon evaluation today patient appears to be doing really about the same. His measurements are maybe slightly smaller than previous. Fortunately there is no signs of active infection which is great news. No fevers, chills, nausea, vomiting, or diarrhea. 07/08/2020 upon evaluation today patient appears to be doing better in regard to his wounds. He looks like he finally actually took the Levaquin which has helped with the infection. I think things are doing much better in that regard which is great news. No fevers, chills, nausea, vomiting, or diarrhea. Unfortunately he did have actual Kleenex/tissue paper over the wound on really not sure why his wife put that on there but nonetheless I think were ready to start wrapping this week. 07/15/2020 on evaluation today patient appears to be doing about the same in regard to his wound. Unfortunately there does not appear to be any significant improvement. With that being said he also came in today with the heel of his wrap completely hanging out where the wound was exposed and just rubbing on the bedroom slipper that he has on today. With that being said this is obviously not ideal. I discussed that with the patient I really think that he is getting need to be more careful and conscientious about what is going on with his wrap. With that being said some of this is to know followed on his own that he actually has in my opinion significant dementia which inhibits  his ability to be able to comply. He has a friend with him today who is trying to help out as best he can I am just not certain that [name] is actually able to do what needs to be done here. 07/22/20 upon evaluation today patient appears to be doing well with regard to his wound is measuring a little bit smaller. With that being said he did not have any dressings on whatsoever upon evaluation today it was just gauze wrapped Curlex over his leg. Subsequently the Coban following this really was not the appropriate wrap and more importantly he had no dressing actually on the wound itself. Fortunately there does not appear to be any signs of active infection which is great news. Unfortunately my main concern here is that the patient needs to have appropriate dressings on in order to prevent infection and allow for continued and appropriate healing. 07/29/2020 upon evaluation today patient appears to be doing okay in regard to his wound. Unfortunately still do not have the Hydrofera Blue in place in fact it appears that he had a wet-to-dry dressing again applied to the heel even after our conversation with the home  health supervisor last week where they told us that that is what the nurse had put in place and that we have been contacted to advise [place] that that was being done. We had never been contacted in any such regard prior to that being done. At that point we advised that until they get the [facility] in they could use a silver alginate dressing. Nonetheless again today the patient has a wet-to-dry dressing only on he is very macerated around the edges he is also very hyper granulated in regard to the wound bed both a direct result of inappropriate dressings being applied to this wound. That is a direct cause of issues going on with home health not following orders as prescribed by the clinic here. 08/05/2020 upon evaluation today patient appears to be doing excellent in regard to his wound. He has  been tolerating the dressing changes without complication and overall very pleased with where things stand currently. With that being said the patient did not have the ABD pad in place and I really think he may actually be able to do better with XtraSorb even at this time. Nonetheless I think that if we can get this swelling under control and then turned the edema lessens is weeping then that will be a dramatic improvement. We can work on that currently. 08/19/2020 upon evaluation today patient appears to be doing excellent in regard to his heel ulcer. He has been tolerating the dressing changes without complication. I do feel like he is making good progress which is great news. There does not appear to be any signs of significant infection at this point at all which is also excellent. LEONIDUS, ROWAND ([id number]) 08/26/2020 upon evaluation today patient appears to be doing excellent in regard to his heel ulcer. He is draining a lot but I think the big issue here is that he probably needs to be elevating his legs a lot more to help with controlling some of this edema. Fortunately there is no evidence of active infection at this time which is great news. No fevers, chills, nausea, vomiting, or diarrhea. 09/02/2020 upon evaluation today patient appears to be doing well with regard to his wound. He is measuring better and the wound is looking much smaller today. I am very pleased with where things stand. He still has a lot of drainage but he tells me he has been trying to elevate his legs as much as possible which I think is helping to some degree. 5/23; wound on the right lateral foot. Using Hydrofera Blue 3 layer compression. 6/1; right lateral foot. Although this looks very healthy it is only slightly improved in terms of width. He is not wearing any offloading. The wound is lateral but very well could be in the weightbearing area 09/23/2020 upon evaluation today patient appears to be doing excellent in  regard to his heel ulcer. He has been tolerating the dressing changes without complication. Fortunately there is no signs of infection at this point which is great news. No fevers, chills, nausea, vomiting, or diarrhea. Electronic Signature(s) Signed: 09/23/2020 2:23:49 PM By: Worthy Keeler PA-C Entered By: Worthy Keeler on 09/23/2020 14:23:49 Bechard, [name] ([id number]) -------------------------------------------------------------------------------- Physical Exam Details [name] Date of Service: 09/23/2020 11:00 AM Medical Record Number: [id number] Patient Account Number: [id number] Date of Birth/Sex: 1934-05-25 (85 y.o. M) Treating RN: Donnamarie Poag Primary Care Provider: Nobie Putnam Other Clinician: Referring Provider: Nobie Putnam Treating Provider/Extender: Skipper Cliche in Treatment: [phone]  Constitutional Well-nourished and well-hydrated in no acute distress. Respiratory normal breathing without difficulty. Psychiatric this patient is able to make decisions and demonstrates good insight into disease process. Alert and Oriented x 3. pleasant and cooperative. Notes Upon inspection patient's wound bed showed signs of good granulation epithelization at this point. I do not see any evidence of active infection currently which is excellent news. Overall I think that the patient is making great progress. There was no need for sharp debridement and he has excellent granulation noted at this point as well. Electronic Signature(s) Signed: 09/23/2020 2:24:23 PM By: Worthy Keeler PA-C Entered By: Worthy Keeler on 09/23/2020 14:24:23 Bookwalter, [name] ([id number]) -------------------------------------------------------------------------------- Physician Orders Details [name] Date of Service: 09/23/2020 11:00 AM Medical Record Number: [id number] Patient Account Number: [id number] Date of Birth/Sex: 02-12-1935 (85 y.o. M) Treating RN:  Donnamarie Poag Primary Care Provider: Nobie Putnam Other Clinician: Referring Provider: Nobie Putnam Treating Provider/Extender: Skipper Cliche in Treatment: 73 Verbal / Phone Orders: No Diagnosis Coding ICD-10 Coding Code Description L89.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, unstageable R29.6 Repeated falls Z79.01 Long term (current) use of anticoagulants Follow-up Appointments o Return Appointment in 1 week. [place] for wound care. May utilize formulary equivalent dressing for wound treatment orders unless otherwise specified. Home Health Nurse may visit PRN to address patientos wound care needs. - [facility] to change on Wednesdays and Fridays , patient to be seen in clinic on [place] Orders/Instructions: - Please Roy OF HEALING Edema Control - Lymphedema / Segmental Compressive Device / Other o Optional: One layer of unna paste to top of compression wrap (to act as an anchor). o Elevate, Exercise Daily and Avoid Standing for Long Periods of Time. - AVOID WALKING MORE THAN NEEDED o Elevate legs to the level of the heart and pump ankles as often as possible o Elevate leg(s) parallel to the floor when sitting. Off-Loading o Open toe surgical shoe - Continue wearing on right foot Wound Treatment Wound #1 - Foot Wound Laterality: Right, Lateral Cleanser: Normal Saline (Generic) 2 x Per Week/30 Days Discharge Instructions: Wash your hands with soap and water. Remove old dressing, discard into plastic bag and place into trash. Cleanse the wound with Normal Saline prior to applying a clean dressing using gauze sponges, not tissues or cotton balls. Do not scrub or use excessive force. Pat dry using gauze sponges, not tissue or cotton balls. Primary Dressing: Hydrofera Blue Ready Transfer Foam, 2.5x2.5 (in/in) (Generic) 2 x Per Week/30  Days Discharge Instructions: Apply Hydrofera Blue Ready to wound bed as directed Secondary Dressing: ABD Pad 5x9 (in/in) 2 x Per Week/30 Days Discharge Instructions: Cover with ABD pad Compression Wrap: Profore Lite LF 3 Multilayer Compression Bandaging System (Generic) 2 x Per Week/30 Days Discharge Instructions: Apply 3 multi-layer wrap as prescribed. Electronic Signature(s) Signed: 09/23/2020 3:39:51 PM By: Donnamarie Poag Signed: 09/23/2020 4:00:10 PM By: Worthy Keeler PA-C Entered By: Donnamarie Poag on 09/23/2020 11:47:19 Chaput, [name] ([id number]) -------------------------------------------------------------------------------- Problem List Details [name] Date of Service: 09/23/2020 11:00 AM Medical Record Number: [id number] Patient Account Number: [id number] Date of Birth/Sex: 10/09/1934 (85 y.o. M) Treating RN: Donnamarie Poag Primary Care Provider: Nobie Putnam Other Clinician: Referring Provider: Nobie Putnam Treating Provider/Extender: Skipper Cliche in Treatment: 16 Active Problems ICD-10 Encounter Code Description Active Date MDM Diagnosis L89.610 Pressure ulcer of right  heel, unstageable 06/03/2020 No Yes R29.6 Repeated falls 06/03/2020 No Yes Z79.01 Long term (current) use of anticoagulants 06/03/2020 No Yes Inactive Problems Resolved Problems Electronic Signature(s) Signed: 09/23/2020 11:24:57 AM By: Worthy Keeler PA-C Entered By: Worthy Keeler on 09/23/2020 11:24:56 Pellot, [name] ([id number]) -------------------------------------------------------------------------------- Progress Note Details [name] Date of Service: 09/23/2020 11:00 AM Medical Record Number: [id number] Patient Account Number: [id number] Date of Birth/Sex: 24-May-1934 (85 y.o. M) Treating RN: Donnamarie Poag Primary Care Provider: Nobie Putnam Other Clinician: Referring Provider: Nobie Putnam Treating Provider/Extender: Skipper Cliche in Treatment: 16 Subjective Chief Complaint Information obtained from Patient Right heel ulcer History of Present Illness (HPI) 06/03/2020 upon evaluation today patient actually presents for initial inspection here in our clinic after having gone to urgent care this morning where they were concerned about infection as well as a significant "softball sized wound with eschar" over the heel. With that being said the patient fortunately does not have an area that largely does have an area of eschar which is draining around the edges really not stable and I think we are getting to remove the eschar so we try to clean up the surface of the wound. Fortunately there is no evidence of active infection at this time. I do believe that he is going require some debridement both manually today with scissors and forceps as well as enzymatic/chemically with the medications. I really feel like that he has a good chance to get this to heal if he keep pressure off of it we did discuss that again today. He does note this has been present for about 2 months. He is not taking any of his current medications that we did put him on the list he tells me that he only occasionally takes them which is obviously not doing anything for him. He does have a cane but he does not use it and his ABI was 1.14 on the right. 06/10/2020 upon evaluation today patient appears to be doing well with regard to his wound all things considered. He still has a lot of necrotic tissue in the central portion of the wound. I think the Iodosorb is still the right thing to do and again the biggest issue is he quit using it as he and his wife felt like that the drainage following putting on the medicine was really "nasty". Nonetheless I explained that the weight Iodoflex breaks down or Iodosorb and that round does make it look really bad but it actually is not as bad as what it seems. In fact it is the normal course of things. 06/24/2020 upon  evaluation today patient appears to be doing unfortunately about the same in regard to his heel. I do not think this is any worse. However its become increasingly clear that he has significant dementia in my opinion. We have had multiple instances going on here from the standpoint of trying to get him to take the oral antibiotic. Initially we ordered it for him he picked it up on 1 afternoon and by the next morning we called to make sure that he had gotten it he told us yes but that he had thrown it away. That he was not to take any additional medications. We then told him he needed to be taken this to get the infection under control he subsequently look for it only found the bottle never took the medication therefore I sent it in a second time for him. After sending this and it  looks like he has not really taken it since that time he tells me that he took 7 pills at once although I do not think they were all of the same medication and then following this it made him sick I think these were all of his different medicines. He decided he was not to take any further medication after that point. Nonetheless I do believe he still is going require treatment for this infection the good news is he has a friend who is a very good friend that is with him here today to try to help with figuring out what to do and how going forward this should be taken care of. 07/01/2020 upon evaluation today patient appears to be doing really about the same. His measurements are maybe slightly smaller than previous. Fortunately there is no signs of active infection which is great news. No fevers, chills, nausea, vomiting, or diarrhea. 07/08/2020 upon evaluation today patient appears to be doing better in regard to his wounds. He looks like he finally actually took the Levaquin which has helped with the infection. I think things are doing much better in that regard which is great news. No fevers, chills, nausea, vomiting,  or diarrhea. Unfortunately he did have actual Kleenex/tissue paper over the wound on really not sure why his wife put that on there but nonetheless I think were ready to start wrapping this week. 07/15/2020 on evaluation today patient appears to be doing about the same in regard to his wound. Unfortunately there does not appear to be any significant improvement. With that being said he also came in today with the heel of his wrap completely hanging out where the wound was exposed and just rubbing on the bedroom slipper that he has on today. With that being said this is obviously not ideal. I discussed that with the patient I really think that he is getting need to be more careful and conscientious about what is going on with his wrap. With that being said some of this is to know followed on his own that he actually has in my opinion significant dementia which inhibits his ability to be able to comply. He has a friend with him today who is trying to help out as best he can I am just not certain that Mr. Grundman is actually able to do what needs to be done here. 07/22/20 upon evaluation today patient appears to be doing well with regard to his wound is measuring a little bit smaller. With that being said he did not have any dressings on whatsoever upon evaluation today it was just gauze wrapped Curlex over his leg. Subsequently the Coban following this really was not the appropriate wrap and more importantly he had no dressing actually on the wound itself. Fortunately there does not appear to be any signs of active infection which is great news. Unfortunately my main concern here is that the patient needs to have appropriate dressings on in order to prevent infection and allow for continued and appropriate healing. 07/29/2020 upon evaluation today patient appears to be doing okay in regard to his wound. Unfortunately still do not have the Hydrofera Blue in place in fact it appears that he had a wet-to-dry  dressing again applied to the heel even after our conversation with the home health supervisor last week where they told us that that is what the nurse had put in place and that we have been contacted to advise [place] that that was being done. We had  never been contacted in any such regard prior to that being done. At that point we advised that until they get the [facility] in they could use a silver alginate dressing. Nonetheless again today the patient has a wet-to-dry dressing only on he is very macerated around the edges he is also very hyper granulated in regard to the wound bed both a direct result of inappropriate dressings being applied to this wound. That is a direct cause of issues going on with home health not following orders as prescribed by the clinic here. 08/05/2020 upon evaluation today patient appears to be doing excellent in regard to his wound. He has been tolerating the dressing changes without complication and overall very pleased with where things stand currently. With that being said the patient did not have the ABD pad in place and I really think he may actually be able to do better with XtraSorb even at this time. Nonetheless I think that if we can get this swelling Kuk, Diem D. ([id number]) under control and then turned the edema lessens is weeping then that will be a dramatic improvement. We can work on that currently. 08/19/2020 upon evaluation today patient appears to be doing excellent in regard to his heel ulcer. He has been tolerating the dressing changes without complication. I do feel like he is making good progress which is great news. There does not appear to be any signs of significant infection at this point at all which is also excellent. 08/26/2020 upon evaluation today patient appears to be doing excellent in regard to his heel ulcer. He is draining a lot but I think the big issue here is that he probably needs to be elevating his legs a lot more to help with  controlling some of this edema. Fortunately there is no evidence of active infection at this time which is great news. No fevers, chills, nausea, vomiting, or diarrhea. 09/02/2020 upon evaluation today patient appears to be doing well with regard to his wound. He is measuring better and the wound is looking much smaller today. I am very pleased with where things stand. He still has a lot of drainage but he tells me he has been trying to elevate his legs as much as possible which I think is helping to some degree. 5/23; wound on the right lateral foot. Using Hydrofera Blue 3 layer compression. 6/1; right lateral foot. Although this looks very healthy it is only slightly improved in terms of width. He is not wearing any offloading. The wound is lateral but very well could be in the weightbearing area 09/23/2020 upon evaluation today patient appears to be doing excellent in regard to his heel ulcer. He has been tolerating the dressing changes without complication. Fortunately there is no signs of infection at this point which is great news. No fevers, chills, nausea, vomiting, or diarrhea. Objective Constitutional Well-nourished and well-hydrated in no acute distress. Vitals Time Taken: 11:15 AM, Height: 71 in, Weight: 216 lbs, BMI: 30.1, Temperature: 98.0 F, Pulse: 74 bpm, Respiratory Rate: 18 breaths/min, Blood Pressure: 150/76 mmHg. Respiratory normal breathing without difficulty. Psychiatric this patient is able to make decisions and demonstrates good insight into disease process. Alert and Oriented x 3. pleasant and cooperative. General Notes: Upon inspection patient's wound bed showed signs of good granulation epithelization at this point. I do not see any evidence of active infection currently which is excellent news. Overall I think that the patient is making great progress. There was no need for sharp debridement  and he has excellent granulation noted at this point as well. Integumentary  (Hair, Skin) Wound #1 status is Open. Original cause of wound was Pressure Injury. The date acquired was: 04/03/2020. The wound has been in treatment 16 weeks. The wound is located on the Right,Lateral Foot. The wound measures 1.1cm length x 1.1cm width x 0.2cm depth; 0.95cm^2 area and 0.19cm^3 volume. There is Fat Layer (Subcutaneous Tissue) exposed. There is no tunneling or undermining noted. There is a large amount of serosanguineous drainage noted. The wound margin is thickened. There is large (67-100%) red, hyper - granulation within the wound bed. There is a small (1-33%) amount of necrotic tissue within the wound bed including Adherent Slough. Assessment Active Problems ICD-10 Pressure ulcer of right heel, unstageable Repeated falls Long term (current) use of anticoagulants Plan Follow-up Appointments: CRISTEN, MURCIA ([id number]) Return Appointment in 1 week. Home Health: [facility] for wound care. May utilize formulary equivalent dressing for wound treatment orders unless otherwise specified. Home Health Nurse may visit PRN to address patient s wound care needs. - [facility] to change on Wednesdays and Fridays , patient to be seen in clinic on [place] Orders/Instructions: - Please [place] OF HEALING Edema Control - Lymphedema / Segmental Compressive Device / Other: Optional: One layer of unna paste to top of compression wrap (to act as an anchor). Elevate, Exercise Daily and Avoid Standing for Long Periods of Time. - AVOID WALKING MORE THAN NEEDED Elevate legs to the level of the heart and pump ankles as often as possible Elevate leg(s) parallel to the floor when sitting. Off-Loading: Open toe surgical shoe - Continue wearing on right foot WOUND #1: - Foot Wound Laterality: Right, Lateral Cleanser: Normal Saline (Generic) 2 x Per Week/30 Days Discharge Instructions: Wash your hands with soap  and water. Remove old dressing, discard into plastic bag and place into trash. Cleanse the wound with Normal Saline prior to applying a clean dressing using gauze sponges, not tissues or cotton balls. Do not scrub or use excessive force. Pat dry using gauze sponges, not tissue or cotton balls. Primary Dressing: Hydrofera Blue Ready Transfer Foam, 2.5x2.5 (in/in) (Generic) 2 x Per Week/30 Days Discharge Instructions: Apply Hydrofera Blue Ready to wound bed as directed Secondary Dressing: ABD Pad 5x9 (in/in) 2 x Per Week/30 Days Discharge Instructions: Cover with ABD pad Compression Wrap: Profore Lite LF 3 Multilayer Compression Bandaging System (Generic) 2 x Per Week/30 Days Discharge Instructions: Apply 3 multi-layer wrap as prescribed. 1. Would recommend currently that we going continue with the [facility] as I feel like this is a great option for the patient and seems to be doing quite well. He is in agreement with that plan. 2. I would also recommend that we have the patient continue with the compression wrap this is a 3 layer compression wrap that seems to be doing great for him. We will see patient back for reevaluation in 1 week here in the clinic. If anything worsens or changes patient will contact our office for additional recommendations. Electronic Signature(s) Signed: 09/23/2020 2:25:02 PM By: Worthy Keeler PA-C Entered By: Worthy Keeler on 09/23/2020 14:25:02 Mckinstry, [name] ([id number]) -------------------------------------------------------------------------------- SuperBill Details [name] Date of Service: 09/23/2020 Medical Record Number: [id number] Patient Account Number: [id number] Date of Birth/Sex: 1934-08-20 (86 y.o. M) Treating RN: Donnamarie Poag Primary Care Provider: Nobie Putnam Other Clinician: Referring Provider: Nobie Putnam Treating  Provider/Extender: Jeri Cos Weeks in Treatment: 16 Diagnosis Coding ICD-10  Codes Code Description L89.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, unstageable R29.6 Repeated falls Z79.01 Long term (current) use of anticoagulants Facility Procedures CPT4 Code: [id number] Description: (Facility Use Only) [phone] - APPLY [place] LWR RT LEG Modifier: Quantity: 1 Physician Procedures CPT4 Code: 0240973 Description: 53299 - WC PHYS LEVEL 3 - EST PT Modifier: Quantity: 1 CPT4 Code: Description: ICD-10 Diagnosis Description L89.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, unstageable R29.6 Repeated falls Z79.01 Long term (current) use of anticoagulants Modifier: Quantity: Electronic Signature(s) Signed: 09/23/2020 2:25:16 PM By: Worthy Keeler PA-C Entered By: Worthy Keeler on 09/23/2020 14:25:16

## 2020-09-23 NOTE — Patient Instructions (Signed)
Visit Information  PATIENT GOALS: Goals Addressed            This Visit's Progress   . Pharmacy Goals       Our goal bad cholesterol, or LDL, is less than 70 . This is why it is important to continue taking your atorvastatin.  Please check your home blood pressure, keep a log of the results and bring this with you to your medical appointments.  Feel free to call me with any questions or concerns. I look forward to our next call!   Harlow Asa, PharmD, [place] [phone]       The patient verbalized understanding of instructions, educational materials, and care plan provided today and declined offer to receive copy of patient instructions, educational materials, and care plan.   Telephone follow up appointment with care management team member scheduled for: 7/8 at 83 am

## 2020-09-23 NOTE — Chronic Care Management (AMB) (Signed)
Chronic Care Management Pharmacy Note  09/23/2020 Name:  [name] MRN:  [id number] DOB:  23-Mar-1935   Subjective: [name] is an 86 y.o. year old male who is a primary patient of [name], DO.  The CCM team was consulted for assistance with disease management and care coordination needs.    Engaged with patient and caregiver by telephone for follow up visit in response to provider referral for pharmacy case management and/or care coordination services.   Consent to Services:  The patient was given information about Chronic Care Management services, agreed to services, and gave verbal consent prior to initiation of services.  Please see initial visit note for detailed documentation.   Patient Care Team: [name], DO as PCP - General (Family Medicine) [name], RN as Case Manager (General Practice) Winfield Cunas Virl Diamond, [name] as Pharmacist Rebekah Chesterfield, [name] as Social Worker (Licensed Clinical Social Worker)  Recent consult visits: Office Visit with [facility] on 5/9, 5/16, 5/23, 6/1  Hospital visits: None in previous 6 months  Objective:  Lab Results  Component Value Date   CREATININE 1.25 (H) 06/13/2020   CREATININE 0.98 08/07/2018   CREATININE 1.14 09/18/2017       Component Value Date/Time   CHOL 163 06/13/2020 1113   TRIG 100 06/13/2020 1113   HDL 37 (L) 06/13/2020 1113   CHOLHDL 4.4 06/13/2020 1113   VLDL 21 07/20/2016 0001   LDLCALC 106 (H) 06/13/2020 1113    Hepatic Function Latest Ref Rng & Units 06/13/2020 09/18/2017 09/08/2017  Total Protein 6.1 - 8.1 g/dL 6.5 7.5 7.8  Albumin 3.5 - 5.0 g/dL - 3.6 4.0  AST 10 - 35 U/L _0 ALT 9 - 46 U/L _1 Alk Phosphatase 38 - 126 U/L - 75 65  Total Bilirubin 0.2 - 1.2 mg/dL 0.6 0.9 0.8  Bilirubin, Direct 0.1 - 0.5 mg/dL - - 0.1     Social History   Tobacco Use  Smoking Status Former Smoker  . Quit date: 82  . Years since quitting: 37.4  Smokeless  Tobacco Former User   BP Readings from Last 3 Encounters:  08/06/20 (!) 162/76  06/26/20 (!) 160/74  06/25/20 140/84   Pulse Readings from Last 3 Encounters:  08/06/20 68  06/26/20 73  06/25/20 73   Wt Readings from Last 3 Encounters:  08/06/20 217 lb 6.4 oz (98.6 kg)  06/26/20 224 lb (101.6 kg)  06/25/20 222 lb 12.8 oz (101.1 kg)    Assessment: Review of patient past medical history, allergies, medications, health status, including review of consultants reports, laboratory and other test data, was performed as part of comprehensive evaluation and provision of chronic care management services.   SDOH:  (Social Determinants of Health) assessments and interventions performed: none   CCM Care Plan  No Known Allergies  Medications Reviewed Today    Reviewed by [name], [name] (Pharmacist) on 09/23/20 at 1602  Med List Status: <None>  Medication Order Taking? Sig Documenting Provider Last Dose Status Informant  aspirin EC 81 MG tablet [id number] Yes Take 1 tablet (81 mg total) by mouth in the morning. (blood thinner) Karamalegos, Devonne Doughty, DO Taking Active   atorvastatin (LIPITOR) 40 MG tablet [id number] Yes Take 1 tablet (40 mg total) by mouth at bedtime. (Cholesterol) [name], DO Taking Active   clopidogrel (PLAVIX) 75 MG tablet [id number] Yes Take 1 tablet (75 mg total) by mouth in  the morning. (blood thinner) Karamalegos, Devonne Doughty, DO Taking Active   finasteride (PROSCAR) 5 MG tablet [id number] Yes Take 1 tablet (5 mg total) by mouth daily. [name] A, PA-C Taking Active   lisinopril (ZESTRIL) 20 MG tablet [id number] Yes Take 1 tablet (20 mg total) by mouth in the morning. (Blood pressure) Karamalegos, Devonne Doughty, DO Taking Active   terazosin (HYTRIN) 5 MG capsule [id number] Yes Take 1 capsule (5 mg total) by mouth at bedtime. (blood pressure, prostate) [name], DO Taking Active           Patient Active Problem List    Diagnosis Date Noted  . Cognitive deficit as late effect of cerebrovascular accident (CVA) 06/25/2020  . Essential hypertension 06/25/2020  . Imbalance 02/01/2018  . Humeral head fracture, left, closed, initial encounter 09/22/2017  . History of cerebrovascular accident (CVA) with residual deficit 09/09/2017  . CKD (chronic kidney disease), stage III ([place]) 09/01/2017  . Elevated PSA, between 10 and less than 20 ng/ml 07/22/2016  . Bilateral lower extremity edema 07/22/2016  . Abnormal glucose 07/15/2016  . BPH with obstruction/lower urinary tract symptoms 04/10/2016  . Benign hypertension with CKD (chronic kidney disease) stage III ([place]) 04/10/2016  . Memory loss 04/10/2016  . Gastric outlet obstruction 12/02/2014  . Generalized abdominal pain   . Benign fibroma of prostate 01/02/2011  . HLD (hyperlipidemia) 01/02/2011  . Obesity (BMI 30.0-34.9) 01/02/2011    Immunization History  Administered Date(s) Administered  . Fluad Quad(high Dose 65+) 12/14/2018  . Influenza, Seasonal, Injecte, Preservative Fre 02/07/2009  . Influenza-Unspecified 02/02/2002, 02/04/2004, 01/19/2007, 01/06/2008, 01/18/2010, 12/20/2010, 12/20/2014  . Pneumococcal Polysaccharide-23 02/27/2002, 08/31/2017  . Td 02/27/2002  . Tdap 09/08/2017    Conditions to be addressed/monitored: HTN and HLD  Care Plan : PharmD - Med Mgmt  Updates made by [name], [name] since 09/23/2020 12:00 AM    Problem: Disease Progression     Long-Range Goal: Disease Progression Prevented or Minimized   Start Date: 07/03/2020  Expected End Date: 10/01/2020  This Visit's Progress: On track  Recent Progress: On track  Priority: High  Note:   Current Barriers:  . Unable to self-administer medications as prescribed . Chronic Disease Management support and education needs related to memory loss and changes . Lack of blood pressure readings for clinical team  Pharmacist Clinical Goal(s):   Over the next 90 days,  patient will achieve adherence to monitoring guidelines and medication adherence to achieve therapeutic efficacy through collaboration with PharmD and provider.   Interventions: . 1:1 collaboration with [name], DO regarding development and update of comprehensive plan of care as evidenced by provider attestation and co-signature . Inter-disciplinary care team collaboration (see longitudinal plan of care) . Perform chart review o Patient seen for Office Visit with Wound Care on 5/9, 5/16, 5/23, 6/1 o Telephone call from [place] on 5/16. Patient advised prostate MRI did not show any high-grade lesions suspicious for prostate cancer.  Advised to start finasteride 5 mg daily and have his PSA rechecked in 3 months. . Today speak with patient's friend/caregiver, [name] [place] RN continuing to help patient with wound care/dressings at home . Speak with patient who reports he attended appointment with Wound Care today and that his wound continues to heal   Medication Adherence: . Reports continuing to take medications from pill pack as filled by [facility] and denies missed doses o Caregiver reports Home Health RN also helping patient with managing  medications . Confirms finasteride 5 mg daily was incorporated into his pill pack is taking as directed by Urology  Hypertension: . Current treatment: o Lisinopril 20 mg QAM o Terazosin 5 mg QHS . Patient has a home wrist monitor . Denies monitoring home BP recently as [place] has been checking for him . Encouraged patient to monitor home blood pressure, keep log of results and bring record to medical appointments  Patient Goals/Self-Care Activities  Over the next 90 days, patient will:  - focus on medication adherence by using weekly pill pack and with assistance from friend, [name]  Follow Up Plan: Telephone follow up appointment with care management team member  scheduled for: 7/8 at 10 am      Patient's preferred pharmacy is:  [facility] - [address], [place] [address] Phone: [phone] Fax: [phone]  [facility] [place], [place] [address] [place] [address] Phone: [phone] Fax: [place], [place], [place], [place] [address] Phone: [phone] Fax: [place], [place] [place] [address] Phone: [phone] Fax: [phone]  Uses pill box? Pill pack  Follow Up:  Patient agrees to Care Plan and Follow-up.  Harlow Asa, PharmD, [place], CPP Clinical Pharmacist [facility] [phone]

## 2020-09-24 NOTE — Progress Notes (Signed)
LENNELL, SHANKS ([id number]) Visit Report for 09/23/2020 Arrival Information Details [name], [name] Date of Service: 09/23/2020 11:00 AM Medical Record Number: [id number] Patient Account Number: [id number] Date of Birth/Sex: [birth year] (86 y.o. M) Treating RN: Donnamarie Poag Primary Care Elanie Hammitt: Nobie Putnam Other Clinician: Referring Roxan Yamamoto: Nobie Putnam Treating Nylani Michetti/Extender: Skipper Cliche in Treatment: 16 Visit Information History Since Last Visit Added or deleted any medications: No Patient Arrived: Ambulatory Had a fall or experienced change in No Arrival Time: 11:14 activities of daily living that may affect Accompanied By: self risk of falls: Transfer Assistance: None Hospitalized since last visit: No Patient Identification Verified: Yes Pain Present Now: No Secondary Verification Process Completed: Yes Patient Requires Transmission-Based Precautions: No Patient Has Alerts: Yes Patient Alerts: Not Diabetic Plavix Electronic Signature(s) Signed: 09/23/2020 3:36:07 PM By: Jeanine Luz Entered By: Jeanine Luz on 09/23/2020 11:15:20 Stefanik, [name] ([id number]) -------------------------------------------------------------------------------- Clinic Level of Care Assessment Details [name] Date of Service: 09/23/2020 11:00 AM Medical Record Number: [id number] Patient Account Number: [id number] Date of Birth/Sex: 04/02/1935 (86 y.o. M) Treating RN: Donnamarie Poag Primary Care Charnetta Wulff: Nobie Putnam Other Clinician: Referring Zivah Mayr: Nobie Putnam Treating Edmar Blankenburg/Extender: Skipper Cliche in Treatment: 16 Clinic Level of Care Assessment Items TOOL 1 Quantity Score _0  - Use when EandM and Procedure is performed on INITIAL visit 0 ASSESSMENTS - Nursing Assessment / Reassessment _1  - General Physical Exam (combine w/ comprehensive assessment (listed just below) when performed on new 0 pt.  evals) _2  - 0 Comprehensive Assessment (HX, ROS, Risk Assessments, Wounds Hx, etc.) ASSESSMENTS - Wound and Skin Assessment / Reassessment _3  - Dermatologic / Skin Assessment (not related to wound area) 0 ASSESSMENTS - Ostomy and/or Continence Assessment and Care _4  - Incontinence Assessment and Management 0 _5  - 0 Ostomy Care Assessment and Management (repouching, etc.) PROCESS - Coordination of Care _6  - Simple Patient / Family Education for ongoing care 0 _7  - 0 Complex (extensive) Patient / Family Education for ongoing care _8  - 0 Staff obtains Programmer, systems, Records, Test Results / Process Orders _9  - 0 Staff telephones HHA, Nursing Homes / Clarify orders / etc _10  - 0 Routine Transfer to another Facility (non-emergent condition) _11  - 0 Routine Hospital Admission (non-emergent condition) _12  - 0 New Admissions / Biomedical engineer / Ordering NPWT, Apligraf, etc. _13  - 0 Emergency Hospital Admission (emergent condition) PROCESS - Special Needs _14  - Pediatric / Minor Patient Management 0 _15  - 0 Isolation Patient Management _16  - 0 Hearing / Language / Visual special needs _17  - 0 Assessment of Community assistance (transportation, D/C planning, etc.) _18  - 0 Additional assistance / Altered mentation _19  - 0 Support Surface(s) Assessment (bed, cushion, seat, etc.) INTERVENTIONS - Miscellaneous _20  - External ear exam 0 _21  - 0 Patient Transfer (multiple staff / Civil Service fast streamer / Similar devices) _22  - 0 Simple Staple / Suture removal (25 or less) _23  - 0 Complex Staple / Suture removal (26 or more) _24  - 0 Hypo/Hyperglycemic Management (do not check if billed separately) _25  - 0 Ankle / Brachial Index (ABI) - do not check if billed separately Has the patient been seen at the hospital within the last three years: Yes Total Score: 0 Level Of Care: ____ [name] ([id number]) Electronic Signature(s) Signed: 09/23/2020 3:39:51 PM By: Donnamarie Poag Entered By: Donnamarie Poag on  09/23/2020 11:47:28 Plair, [name] ([id number]) -------------------------------------------------------------------------------- Encounter Discharge Information Details Patient Name: Jonathan Moros D. Date of Service: 09/23/2020 11:00 AM Medical Record Number:  [id number] Patient Account Number: [id number] Date of Birth/Sex: 03/07/1935 (86 y.o. M) Treating RN: Carlene Coria Primary Care Orlanda Lemmerman: Nobie Putnam Other Clinician: Referring Shayleen Eppinger: Nobie Putnam Treating Abdulai Blaylock/Extender: Skipper Cliche in Treatment: 16 Encounter Discharge Information Items Discharge Condition: Stable Ambulatory Status: Ambulatory Discharge Destination: Home Transportation: Private Auto Accompanied By: self Schedule Follow-up Appointment: Yes Clinical Summary of Care: Patient Declined Electronic Signature(s) Signed: 09/24/2020 8:01:20 AM By: Carlene Coria RN Entered By: Carlene Coria on 09/23/2020 12:04:43 Alf, [name] ([id number]) -------------------------------------------------------------------------------- Lower Extremity Assessment Details [name] Date of Service: 09/23/2020 11:00 AM Medical Record Number: [id number] Patient Account Number: [id number] Date of Birth/Sex: [birth year] (86 y.o. M) Treating RN: Donnamarie Poag Primary Care Roberts Bon: Nobie Putnam Other Clinician: Referring Regnald Bowens: Nobie Putnam Treating Vikkie Goeden/Extender: Skipper Cliche in Treatment: 16 Edema Assessment Assessed: [Left: No] Patrice Paradise: Yes] Edema: [Left: Ye] [Right: s] Calf Left: Right: Point of Measurement: 36 cm From Medial Instep 35.8 cm Ankle Left: Right: Point of Measurement: 12 cm From Medial Instep 23 cm Vascular Assessment Pulses: Dorsalis Pedis Palpable: [Right:Yes] Electronic Signature(s) Signed: 09/23/2020 3:36:07 PM By: Jeanine Luz Signed: 09/23/2020 3:39:51 PM By: Donnamarie Poag Entered By: Jeanine Luz on 09/23/2020 11:27:02 Moe, Covey D.  ([id number]) -------------------------------------------------------------------------------- Multi Wound Chart Details Patient Name: Jonathan Moros D. Date of Service: 09/23/2020 11:00 AM Medical Record Number: [id number] Patient Account Number: [id number] Date of Birth/Sex: 07-05-1934 (86 y.o. M) Treating RN: Donnamarie Poag Primary Care Lexa Coronado: Nobie Putnam Other Clinician: Referring Joah Patlan: Nobie Putnam Treating Marshell Dilauro/Extender: Skipper Cliche in Treatment: 16 Vital Signs Height(in): 71 Pulse(bpm): 74 Weight(lbs): 216 Blood Pressure(mmHg): 150/76 Body Mass Index(BMI): 30 Temperature(F): 98.0 Respiratory Rate(breaths/min): 18 Photos: [N/A:N/A] Wound Location: Right, Lateral Foot N/A N/A Wounding Event: Pressure Injury N/A N/A Primary Etiology: Pressure Ulcer N/A N/A Comorbid History: Cataracts, Hypertension N/A N/A Date Acquired: 04/03/2020 N/A N/A Weeks of Treatment: 16 N/A N/A Wound Status: Open N/A N/A Measurements L x W x D (cm) 1.1x1.1x0.2 N/A N/A Area (cm) : 0.95 N/A N/A Volume (cm) : 0.19 N/A N/A % Reduction in Area: 95.80% N/A N/A % Reduction in Volume: 98.30% N/A N/A Classification: Unstageable/Unclassified N/A N/A Exudate Amount: Large N/A N/A Exudate Type: Serosanguineous N/A N/A Exudate Color: red, brown N/A N/A Wound Margin: Thickened N/A N/A Granulation Amount: Large (67-100%) N/A N/A Granulation Quality: Red, Hyper-granulation N/A N/A Necrotic Amount: Small (1-33%) N/A N/A Exposed Structures: Fat Layer (Subcutaneous Tissue): N/A N/A Yes Fascia: No Tendon: No Muscle: No Joint: No Bone: No Epithelialization: Small (1-33%) N/A N/A Treatment Notes Electronic Signature(s) Signed: 09/23/2020 3:39:51 PM By: Donnamarie Poag Entered By: Donnamarie Poag on 09/23/2020 11:45:53 Mees, [name] ([id number]) -------------------------------------------------------------------------------- Multi-Disciplinary Care Plan Details [name] Date of Service: 09/23/2020 11:00 AM Medical Record Number: [id number] Patient Account Number: [id number] Date of Birth/Sex: 10-14-1934 (86 y.o. M) Treating RN: Donnamarie Poag Primary Care Nadege Carriger: Nobie Putnam Other Clinician: Referring Oberia Beaudoin: Nobie Putnam Treating Jaeden Messer/Extender: Skipper Cliche in Treatment: 16 Active Inactive Wound/Skin Impairment Nursing Diagnoses: Knowledge deficit related to ulceration/compromised skin integrity Goals: Patient/caregiver will verbalize understanding of skin care regimen Date Initiated: 06/03/2020 Date Inactivated: 08/20/2020 Target Resolution Date: 08/31/2020 Goal Status: Met Ulcer/skin breakdown will have a volume reduction of 30% by week 4 Date Initiated: 06/03/2020 Date Inactivated: 07/01/2020 Target Resolution Date: 07/01/2020 Goal Status: Met Ulcer/skin breakdown will have a volume reduction of 50% by week 8 Date Initiated: 07/01/2020 Date Inactivated: 08/05/2020 Target Resolution Date: 08/01/2020 Goal Status: Met Ulcer/skin breakdown will have a volume  reduction of 80% by week 12 Date Initiated: 07/01/2020 Date Inactivated: 09/02/2020 Target Resolution Date: 08/31/2020 Goal Status: Met Ulcer/skin breakdown will heal within 14 weeks Date Initiated: 07/01/2020 Target Resolution Date: 10/01/2020 Goal Status: Active Interventions: Assess patient/caregiver ability to obtain necessary supplies Assess patient/caregiver ability to perform ulcer/skin care regimen upon admission and as needed Assess ulceration(s) every visit Notes: Electronic Signature(s) Signed: 09/23/2020 3:39:51 PM By: Donnamarie Poag Entered By: Donnamarie Poag on 09/23/2020 11:45:44 Teicher, Dwan D. ([id number]) -------------------------------------------------------------------------------- Pain Assessment Details [name] Date of Service: 09/23/2020 11:00 AM Medical Record Number: [id number] Patient Account Number: [id number] Date of  Birth/Sex: 12-14-1934 (86 y.o. M) Treating RN: Donnamarie Poag Primary Care Joevanni Roddey: Nobie Putnam Other Clinician: Referring Alithea Lapage: Nobie Putnam Treating Chade Pitner/Extender: Skipper Cliche in Treatment: 16 Active Problems Location of Pain Severity and Description of Pain Patient Has Paino No Site Locations Rate the pain. Current Pain Level: 0 Pain Management and Medication Current Pain Management: Electronic Signature(s) Signed: 09/23/2020 3:36:07 PM By: Jeanine Luz Signed: 09/23/2020 3:39:51 PM By: Donnamarie Poag Entered By: Jeanine Luz on 09/23/2020 11:16:57 Althoff, [name] ([id number]) -------------------------------------------------------------------------------- Patient/Caregiver Education Details [name] Date of Service: 09/23/2020 11:00 AM Medical Record Number: [id number] Patient Account Number: [id number] Date of Birth/Gender: Feb 07, 1935 (86 y.o. M) Treating RN: Donnamarie Poag Primary Care Physician: Nobie Putnam Other Clinician: Referring Physician: Nobie Putnam Treating Physician/Extender: Skipper Cliche in Treatment: 16 Education Assessment Education Provided To: Patient Education Topics Provided Basic Hygiene: Nutrition: Offloading: Methods: Explain/Verbal Responses: State content correctly Wound/Skin Impairment: Electronic Signature(s) Signed: 09/23/2020 3:39:51 PM By: Donnamarie Poag Entered By: Donnamarie Poag on 09/23/2020 11:46:20 Newburn, Chares DMarland Kitchen ([id number]) -------------------------------------------------------------------------------- Wound Assessment Details Patient Name: Jonathan Moros D. Date of Service: 09/23/2020 11:00 AM Medical Record Number: [id number] Patient Account Number: [id number] Date of Birth/Sex: [birth year] (86 y.o. M) Treating RN: Donnamarie Poag Primary Care Philopateer Strine: Nobie Putnam Other Clinician: Referring Paulena Servais: Nobie Putnam Treating Dallin Mccorkel/Extender: Skipper Cliche in Treatment: 16 Wound Status Wound Number: 1 Primary Etiology: Pressure Ulcer Wound Location: Right, Lateral Foot Wound Status: Open Wounding Event: Pressure Injury Comorbid History: Cataracts, Hypertension Date Acquired: 04/03/2020 Weeks Of Treatment: 16 Clustered Wound: No Photos Wound Measurements Length: (cm) 1.1 Width: (cm) 1.1 Depth: (cm) 0.2 Area: (cm) 0.95 Volume: (cm) 0.19 % Reduction in Area: 95.8% % Reduction in Volume: 98.3% Epithelialization: Small (1-33%) Tunneling: No Undermining: No Wound Description Classification: Unstageable/Unclassified Wound Margin: Thickened Exudate Amount: Large Exudate Type: Serosanguineous Exudate Color: red, brown Foul Odor After Cleansing: No Slough/Fibrino Yes Wound Bed Granulation Amount: Large (67-100%) Exposed Structure Granulation Quality: Red, Hyper-granulation Fascia Exposed: No Necrotic Amount: Small (1-33%) Fat Layer (Subcutaneous Tissue) Exposed: Yes Necrotic Quality: Adherent Slough Tendon Exposed: No Muscle Exposed: No Joint Exposed: No Bone Exposed: No Treatment Notes Wound #1 (Foot) Wound Laterality: Right, Lateral Cleanser Normal Saline Discharge Instruction: Wash your hands with soap and water. Remove old dressing, discard into plastic bag and place into trash. Cleanse the wound with Normal Saline prior to applying a clean dressing using gauze sponges, not tissues or cotton balls. Do not scrub or use excessive force. Pat dry using gauze sponges, not tissue or cotton balls. W[place]NTON, HUFSTETLER ([id number]) Peri-Wound Care Topical Primary Dressing Hydrofera Blue Ready Transfer Foam, 2.5x2.5 (in/in) Discharge Instruction: Apply Hydrofera Blue Ready to wound bed as directed Secondary Dressing ABD Pad 5x9 (in/in) Discharge Instruction: Cover with ABD pad Secured With Compression Wrap Profore Lite LF [place] Discharge Instruction: Apply 3  multi-layer wrap as  prescribed. Compression Stockings Add-Ons Electronic Signature(s) Signed: 09/23/2020 3:36:07 PM By: Jeanine Luz Signed: 09/23/2020 3:39:51 PM By: Donnamarie Poag Entered By: Jeanine Luz on 09/23/2020 11:27:57 [place]wood, [name] ([id number]) -------------------------------------------------------------------------------- Vitals Details Patient Name: Jonathan Moros D. Date of Service: 09/23/2020 11:00 AM Medical Record Number: [id number] Patient Account Number: [id number] Date of Birth/Sex: 13-Jul-1934 (86 y.o. M) Treating RN: Donnamarie Poag Primary Care Marymargaret Kirker: Nobie Putnam Other Clinician: Referring Keyden Pavlov: Nobie Putnam Treating Jayanna Kroeger/Extender: Skipper Cliche in Treatment: 16 Vital Signs Time Taken: 11:15 Temperature (F): 98.0 Height (in): 71 Pulse (bpm): 74 Weight (lbs): 216 Respiratory Rate (breaths/min): 18 Body Mass Index (BMI): 30.1 Blood Pressure (mmHg): 150/76 Reference Range: 80 - 120 mg / dl Electronic Signature(s) Signed: 09/23/2020 3:36:07 PM By: Jeanine Luz Entered By: Jeanine Luz on 09/23/2020 11:16:48

## 2020-09-25 DIAGNOSIS — I69315 Cognitive social or emotional deficit following cerebral infarction: Secondary | ICD-10-CM | POA: Diagnosis not present

## 2020-09-25 DIAGNOSIS — L89616 Pressure-induced deep tissue damage of right heel: Secondary | ICD-10-CM | POA: Diagnosis not present

## 2020-09-25 DIAGNOSIS — N183 Chronic kidney disease, stage 3 unspecified: Secondary | ICD-10-CM | POA: Diagnosis not present

## 2020-09-25 DIAGNOSIS — R300 Dysuria: Secondary | ICD-10-CM | POA: Diagnosis not present

## 2020-09-25 DIAGNOSIS — E785 Hyperlipidemia, unspecified: Secondary | ICD-10-CM | POA: Diagnosis not present

## 2020-09-25 DIAGNOSIS — I129 Hypertensive chronic kidney disease with stage 1 through stage 4 chronic kidney disease, or unspecified chronic kidney disease: Secondary | ICD-10-CM | POA: Diagnosis not present

## 2020-09-27 DIAGNOSIS — I129 Hypertensive chronic kidney disease with stage 1 through stage 4 chronic kidney disease, or unspecified chronic kidney disease: Secondary | ICD-10-CM | POA: Diagnosis not present

## 2020-09-27 DIAGNOSIS — I69315 Cognitive social or emotional deficit following cerebral infarction: Secondary | ICD-10-CM | POA: Diagnosis not present

## 2020-09-27 DIAGNOSIS — R300 Dysuria: Secondary | ICD-10-CM | POA: Diagnosis not present

## 2020-09-27 DIAGNOSIS — N183 Chronic kidney disease, stage 3 unspecified: Secondary | ICD-10-CM | POA: Diagnosis not present

## 2020-09-27 DIAGNOSIS — L89612 Pressure ulcer of right heel, stage 2: Secondary | ICD-10-CM | POA: Diagnosis not present

## 2020-09-27 DIAGNOSIS — E785 Hyperlipidemia, unspecified: Secondary | ICD-10-CM | POA: Diagnosis not present

## 2020-09-27 DIAGNOSIS — L89616 Pressure-induced deep tissue damage of right heel: Secondary | ICD-10-CM | POA: Diagnosis not present

## 2020-09-30 ENCOUNTER — Ambulatory Visit (INDEPENDENT_AMBULATORY_CARE_PROVIDER_SITE_OTHER): Payer: Medicare Other | Admitting: Family Medicine

## 2020-09-30 ENCOUNTER — Encounter: Payer: Self-pay | Admitting: Family Medicine

## 2020-09-30 ENCOUNTER — Encounter: Payer: Medicare Other | Admitting: Physician Assistant

## 2020-09-30 ENCOUNTER — Other Ambulatory Visit: Payer: Self-pay

## 2020-09-30 VITALS — BP 115/54 | HR 72 | Ht 70.0 in | Wt 214.0 lb

## 2020-09-30 DIAGNOSIS — N183 Chronic kidney disease, stage 3 unspecified: Secondary | ICD-10-CM

## 2020-09-30 DIAGNOSIS — I69319 Unspecified symptoms and signs involving cognitive functions following cerebral infarction: Secondary | ICD-10-CM

## 2020-09-30 DIAGNOSIS — I6930 Unspecified sequelae of cerebral infarction: Secondary | ICD-10-CM | POA: Diagnosis not present

## 2020-09-30 DIAGNOSIS — I129 Hypertensive chronic kidney disease with stage 1 through stage 4 chronic kidney disease, or unspecified chronic kidney disease: Secondary | ICD-10-CM | POA: Diagnosis not present

## 2020-09-30 DIAGNOSIS — R296 Repeated falls: Secondary | ICD-10-CM | POA: Diagnosis not present

## 2020-09-30 DIAGNOSIS — L89890 Pressure ulcer of other site, unstageable: Secondary | ICD-10-CM | POA: Diagnosis not present

## 2020-09-30 DIAGNOSIS — N401 Enlarged prostate with lower urinary tract symptoms: Secondary | ICD-10-CM

## 2020-09-30 DIAGNOSIS — L89610 Pressure ulcer of right heel, unstageable: Secondary | ICD-10-CM | POA: Diagnosis not present

## 2020-09-30 DIAGNOSIS — R413 Other amnesia: Secondary | ICD-10-CM

## 2020-09-30 DIAGNOSIS — Z09 Encounter for follow-up examination after completed treatment for conditions other than malignant neoplasm: Secondary | ICD-10-CM | POA: Diagnosis not present

## 2020-09-30 DIAGNOSIS — Z7901 Long term (current) use of anticoagulants: Secondary | ICD-10-CM | POA: Diagnosis not present

## 2020-09-30 DIAGNOSIS — N138 Other obstructive and reflux uropathy: Secondary | ICD-10-CM | POA: Diagnosis not present

## 2020-09-30 NOTE — Assessment & Plan Note (Signed)
Persistent chronic problem without significant decline Followed by CCM team. 

## 2020-09-30 NOTE — Patient Instructions (Addendum)
Thank you for coming to the office today.  BP is controlled  No changes to medications.  Continue current course with Urology. Will defer Sleep Study since primary issue appears to be urinary at this time.  Please schedule a Follow-up Appointment to: Return in about 4 months (around 01/30/2021) for 4 month follow-up HTN, Urology, Memory.  If you have any other questions or concerns, please feel free to call the office or send a message through [place]. You may also schedule an earlier appointment if necessary.  Additionally, you may be receiving a survey about your experience at our office within a few days to 1 week by e-mail or mail. We value your feedback.  Nobie Putnam, DO [place]

## 2020-09-30 NOTE — Assessment & Plan Note (Signed)
Followed by [facility] On Finasteride, Alpha blocker, had MRI prostate

## 2020-09-30 NOTE — Assessment & Plan Note (Signed)
History of CVA w/ residual cognitive decline Stable without dramatic change since prior visit 

## 2020-09-30 NOTE — Assessment & Plan Note (Signed)
Well-controlled HTN - Home BP readings controlled  Complication with CKD-III   Plan:  1. Continue current BP regimen 2. Encourage improved lifestyle - low sodium diet, regular exercise 3. Continue monitor BP outside office, bring readings to next visit, if persistently >140/90 or new symptoms notify office sooner 

## 2020-09-30 NOTE — Progress Notes (Signed)
Subjective:    Patient ID: [name], male    DOB: 01-29-1935, 86 y.o.   MRN: [id number]  [name] is a 86 y.o. male presenting on 09/30/2020 for Sleep Apnea and Hypertension   HPI  CHRONIC HTN w/ CKD III Improved adherence with family/caregiver support and CCM team, he has pill pack. Current Meds - Lisinopril 20mg  daily, Terazosin 5mg  daily Reports good compliance, took meds today. Tolerating well, w/o complaints. Lifestyle: - Diet: balanced diet - Exercise: walking Denies CP, dyspnea, HA, edema, dizziness / lightheadedness   Hyperlipidemia History of CVA Cognitive Decline following stroke. On Atorvastatin 40mg , ASA 81 and Plavix 75mg  was on DAPT for stroke secondary prevention. Needs help adhering to medicines.  Elevated PSA / BPH Nocturia Followed by Urology [facility] He had Prostate MRI, and on Finasteride. Still has nocturia and urinary frequency. He denies having sleeping issue but has nocturia that interferes with sleep.   Depression screen [facility] 2/9 09/30/2020 06/26/2019 10/18/2018  Decreased Interest 0 0 0  Down, Depressed, Hopeless 0 0 0  PHQ - 2 Score 0 0 0    Social History   Tobacco Use   Smoking status: Former    Pack years: 0.00    Types: Cigarettes    Quit date: 1985    Years since quitting: 37.4   Smokeless tobacco: Former  Scientific laboratory technician Use: Never used  Substance Use Topics   Alcohol use: No   Drug use: No    Review of Systems Per HPI unless specifically indicated above     Objective:    BP (!) 115/54   Pulse 72   Ht 5\' 10"  (1.778 m)   Wt 214 lb (97.1 kg)   SpO2 96%   BMI 30.71 kg/m   Wt Readings from Last 3 Encounters:  09/30/20 214 lb (97.1 kg)  08/06/20 217 lb 6.4 oz (98.6 kg)  06/26/20 224 lb (101.6 kg)    Physical Exam Vitals and nursing note reviewed.  Constitutional:      General: He is not in acute distress.    Appearance: He is well-developed. He is not diaphoretic.     Comments: Well-appearing, comfortable,  cooperative  HENT:     Head: Normocephalic and atraumatic.  Eyes:     General:        Right eye: No discharge.        Left eye: No discharge.     Conjunctiva/sclera: Conjunctivae normal.  Neck:     Thyroid: No thyromegaly.  Cardiovascular:     Rate and Rhythm: Normal rate and regular rhythm.     Heart sounds: Normal heart sounds. No murmur heard. Pulmonary:     Effort: Pulmonary effort is normal. No respiratory distress.     Breath sounds: Normal breath sounds. No wheezing or rales.  Musculoskeletal:        General: Normal range of motion.     Cervical back: Normal range of motion and neck supple.  Lymphadenopathy:     Cervical: No cervical adenopathy.  Skin:    General: Skin is warm and dry.     Findings: No erythema or rash.  Neurological:     Mental Status: He is alert and oriented to person, place, and time.  Psychiatric:        Behavior: Behavior normal.     Comments: Well groomed, good eye contact, normal speech and thoughts   Results for orders placed or performed in visit on 06/26/20  Bladder  Scan (Post Void Residual) in office  Result Value Ref Range   Scan Result 84mL       Assessment & Plan:   Problem List Items Addressed This Visit     Memory loss   History of cerebrovascular accident (CVA) with residual deficit    History of CVA w/ residual cognitive decline Stable without dramatic change since prior visit       Cognitive deficit as late effect of cerebrovascular accident (CVA)    Persistent chronic problem without significant decline Followed by CCM team.       BPH with obstruction/lower urinary tract symptoms    Followed by [facility] On Finasteride, Alpha blocker, had MRI prostate       Benign hypertension with CKD (chronic kidney disease) stage III ([place]) - Primary    Well-controlled HTN - Home BP readings controlled  Complication with CKD-III   Plan:  1. Continue current BP regimen 2. Encourage improved lifestyle - low sodium diet,  regular exercise 3. Continue monitor BP outside office, bring readings to next visit, if persistently >140/90 or new symptoms notify office sooner        No orders of the defined types were placed in this encounter.    Follow up plan: Return in about 4 months (around 01/30/2021) for 4 month follow-up HTN, Urology, Memory.   Nobie Putnam, [facility] 09/30/2020, 9:37 AM

## 2020-09-30 NOTE — Progress Notes (Addendum)
TRANELL, WOJTKIEWICZ ([id number]) Visit Report for 09/30/2020 Chief Complaint Document Details [name], [name] Date of Service: 09/30/2020 11:00 AM Medical Record Number: [id number] Patient Account Number: [id number] Date of Birth/Sex: 25-Mar-1935 (85 y.o. M) Treating RN: Donnamarie Poag Primary Care Provider: Nobie Putnam Other Clinician: Referring Provider: Nobie Putnam Treating Provider/Extender: Skipper Cliche in Treatment: 17 Information Obtained from: Patient Chief Complaint Right heel ulcer Electronic Signature(s) Signed: 10/04/2020 4:50:32 PM By: Worthy Keeler PA-C Entered By: Worthy Keeler on 10/04/2020 16:50:32 Reeder, [name] ([id number]) -------------------------------------------------------------------------------- HPI Details [name] Date of Service: 09/30/2020 11:00 AM Medical Record Number: [id number] Patient Account Number: [id number] Date of Birth/Sex: [birth year] (85 y.o. M) Treating RN: Donnamarie Poag Primary Care Provider: Nobie Putnam Other Clinician: Referring Provider: Nobie Putnam Treating Provider/Extender: Skipper Cliche in Treatment: 17 History of Present Illness HPI Description: 06/03/2020 upon evaluation today patient actually presents for initial inspection here in our clinic after having gone to urgent care this morning where they were concerned about infection as well as a significant "softball sized wound with eschar" over the heel. With that being said the patient fortunately does not have an area that largely does have an area of eschar which is draining around the edges really not stable and I think we are getting to remove the eschar so we try to clean up the surface of the wound. Fortunately there is no evidence of active infection at this time. I do believe that he is going require some debridement both manually today with scissors and forceps as well as enzymatic/chemically with the  medications. I really feel like that he has a good chance to get this to heal if he keep pressure off of it we did discuss that again today. He does note this has been present for about 2 months. He is not taking any of his current medications that we did put him on the list he tells me that he only occasionally takes them which is obviously not doing anything for him. He does have a cane but he does not use it and his ABI was 1.14 on the right. 06/10/2020 upon evaluation today patient appears to be doing well with regard to his wound all things considered. He still has a lot of necrotic tissue in the central portion of the wound. I think the Iodosorb is still the right thing to do and again the biggest issue is he quit using it as he and his wife felt like that the drainage following putting on the medicine was really "nasty". Nonetheless I explained that the weight Iodoflex breaks down or Iodosorb and that round does make it look really bad but it actually is not as bad as what it seems. In fact it is the normal course of things. 06/24/2020 upon evaluation today patient appears to be doing unfortunately about the same in regard to his heel. I do not think this is any worse. However its become increasingly clear that he has significant dementia in my opinion. We have had multiple instances going on here from the standpoint of trying to get him to take the oral antibiotic. Initially we ordered it for him he picked it up on 1 afternoon and by the next morning we called to make sure that he had gotten it he told us yes but that he had thrown it away. That he was not to take any additional medications. We then told him he needed to be taken this  to get the infection under control he subsequently look for it only found the bottle never took the medication therefore I sent it in a second time for him. After sending this and it looks like he has not really taken it since that time he tells me that he took 7  pills at once although I do not think they were all of the same medication and then following this it made him sick I think these were all of his different medicines. He decided he was not to take any further medication after that point. Nonetheless I do believe he still is going require treatment for this infection the good news is he has a friend who is a very good friend that is with him here today to try to help with figuring out what to do and how going forward this should be taken care of. 07/01/2020 upon evaluation today patient appears to be doing really about the same. His measurements are maybe slightly smaller than previous. Fortunately there is no signs of active infection which is great news. No fevers, chills, nausea, vomiting, or diarrhea. 07/08/2020 upon evaluation today patient appears to be doing better in regard to his wounds. He looks like he finally actually took the Levaquin which has helped with the infection. I think things are doing much better in that regard which is great news. No fevers, chills, nausea, vomiting, or diarrhea. Unfortunately he did have actual Kleenex/tissue paper over the wound on really not sure why his wife put that on there but nonetheless I think were ready to start wrapping this week. 07/15/2020 on evaluation today patient appears to be doing about the same in regard to his wound. Unfortunately there does not appear to be any significant improvement. With that being said he also came in today with the heel of his wrap completely hanging out where the wound was exposed and just rubbing on the bedroom slipper that he has on today. With that being said this is obviously not ideal. I discussed that with the patient I really think that he is getting need to be more careful and conscientious about what is going on with his wrap. With that being said some of this is to know followed on his own that he actually has in my opinion significant dementia which inhibits  his ability to be able to comply. He has a friend with him today who is trying to help out as best he can I am just not certain that [name] is actually able to do what needs to be done here. 07/22/20 upon evaluation today patient appears to be doing well with regard to his wound is measuring a little bit smaller. With that being said he did not have any dressings on whatsoever upon evaluation today it was just gauze wrapped Curlex over his leg. Subsequently the Coban following this really was not the appropriate wrap and more importantly he had no dressing actually on the wound itself. Fortunately there does not appear to be any signs of active infection which is great news. Unfortunately my main concern here is that the patient needs to have appropriate dressings on in order to prevent infection and allow for continued and appropriate healing. 07/29/2020 upon evaluation today patient appears to be doing okay in regard to his wound. Unfortunately still do not have the Hydrofera Blue in place in fact it appears that he had a wet-to-dry dressing again applied to the heel even after our conversation with the home  health supervisor last week where they told us that that is what the nurse had put in place and that we have been contacted to advise [place] that that was being done. We had never been contacted in any such regard prior to that being done. At that point we advised that until they get the [facility] in they could use a silver alginate dressing. Nonetheless again today the patient has a wet-to-dry dressing only on he is very macerated around the edges he is also very hyper granulated in regard to the wound bed both a direct result of inappropriate dressings being applied to this wound. That is a direct cause of issues going on with home health not following orders as prescribed by the clinic here. 08/05/2020 upon evaluation today patient appears to be doing excellent in regard to his wound. He has  been tolerating the dressing changes without complication and overall very pleased with where things stand currently. With that being said the patient did not have the ABD pad in place and I really think he may actually be able to do better with XtraSorb even at this time. Nonetheless I think that if we can get this swelling under control and then turned the edema lessens is weeping then that will be a dramatic improvement. We can work on that currently. 08/19/2020 upon evaluation today patient appears to be doing excellent in regard to his heel ulcer. He has been tolerating the dressing changes without complication. I do feel like he is making good progress which is great news. There does not appear to be any signs of significant infection at this point at all which is also excellent. KASHTEN, GOWIN ([id number]) 08/26/2020 upon evaluation today patient appears to be doing excellent in regard to his heel ulcer. He is draining a lot but I think the big issue here is that he probably needs to be elevating his legs a lot more to help with controlling some of this edema. Fortunately there is no evidence of active infection at this time which is great news. No fevers, chills, nausea, vomiting, or diarrhea. 09/02/2020 upon evaluation today patient appears to be doing well with regard to his wound. He is measuring better and the wound is looking much smaller today. I am very pleased with where things stand. He still has a lot of drainage but he tells me he has been trying to elevate his legs as much as possible which I think is helping to some degree. 5/23; wound on the right lateral foot. Using Hydrofera Blue 3 layer compression. 6/1; right lateral foot. Although this looks very healthy it is only slightly improved in terms of width. He is not wearing any offloading. The wound is lateral but very well could be in the weightbearing area 09/23/2020 upon evaluation today patient appears to be doing excellent in  regard to his heel ulcer. He has been tolerating the dressing changes without complication. Fortunately there is no signs of infection at this point which is great news. No fevers, chills, nausea, vomiting, or diarrhea. 10/04/2020 upon evaluation today patient actually appears to be doing excellent in regard to his wound. There does not appear to be any signs of infection. Overall I am extremely pleased with where things stand. I think he is on the right track and getting very close to resolution. Electronic Signature(s) Signed: 10/04/2020 4:50:44 PM By: Worthy Keeler PA-C Entered By: Worthy Keeler on 10/04/2020 16:50:44 Maltos, [name] ([id number]) -------------------------------------------------------------------------------- Physical Exam Details  Patient Name: RAJINDER, MESICK. Date of Service: 09/30/2020 11:00 AM Medical Record Number: [id number] Patient Account Number: [id number] Date of Birth/Sex: [birth year] (85 y.o. M) Treating RN: Donnamarie Poag Primary Care Provider: Nobie Putnam Other Clinician: Referring Provider: Nobie Putnam Treating Provider/Extender: Skipper Cliche in Treatment: 18 Constitutional Well-nourished and well-hydrated in no acute distress. Respiratory normal breathing without difficulty. Psychiatric this patient is able to make decisions and demonstrates good insight into disease process. Alert and Oriented x 3. pleasant and cooperative. Notes Upon inspection patient's wound bed showed signs of good granulation epithelization at this point. There does not appear to be any evidence of active infection which is great and overall I am extremely pleased with where we stand today. Electronic Signature(s) Signed: 10/04/2020 4:50:59 PM By: Worthy Keeler PA-C Entered By: Worthy Keeler on 10/04/2020 16:50:59 Curless, [name] ([id number]) -------------------------------------------------------------------------------- Physician Orders Details Patient  Name: [name] Date of Service: 09/30/2020 11:00 AM Medical Record Number: [id number] Patient Account Number: [id number] Date of Birth/Sex: [birth year] (85 y.o. M) Treating RN: Donnamarie Poag Primary Care Provider: Nobie Putnam Other Clinician: Referring Provider: Nobie Putnam Treating Provider/Extender: Skipper Cliche in Treatment: [phone] Verbal / Phone Orders: No Diagnosis Coding Follow-up Appointments o Return Appointment in 1 week. [place] for wound care. May utilize formulary equivalent dressing for wound treatment orders unless otherwise specified. Home Health Nurse may visit PRN to address patientos wound care needs. - Home Health/[facility] to change on Wednesdays and Fridays , patient to be seen in clinic on [place] Orders/Instructions: - Please [place] OF HEALING Edema Control - Lymphedema / Segmental Compressive Device / Other o Optional: One layer of unna paste to top of compression wrap (to act as an anchor). o Elevate, Exercise Daily and Avoid Standing for Long Periods of Time. - AVOID WALKING MORE THAN NEEDED o Elevate legs to the level of the heart and pump ankles as often as possible o Elevate leg(s) parallel to the floor when sitting. Off-Loading o Open toe surgical shoe - Continue wearing on right foot Wound Treatment Wound #1 - Foot Wound Laterality: Right, Lateral Cleanser: Normal Saline (Generic) 2 x Per Week/30 Days Discharge Instructions: Wash your hands with soap and water. Remove old dressing, discard into plastic bag and place into trash. Cleanse the wound with Normal Saline prior to applying a clean dressing using gauze sponges, not tissues or cotton balls. Do not scrub or use excessive force. Pat dry using gauze sponges, not tissue or cotton balls. Primary Dressing: Hydrofera Blue Ready Transfer Foam, 2.5x2.5  (in/in) (Generic) 2 x Per Week/30 Days Discharge Instructions: Apply Hydrofera Blue Ready to wound bed as directed Secondary Dressing: ABD Pad 5x9 (in/in) 2 x Per Week/30 Days Discharge Instructions: Cover with ABD pad Compression Wrap: Profore Lite LF 3 Multilayer Compression Bandaging System (Generic) 2 x Per Week/30 Days Discharge Instructions: Apply 3 multi-layer wrap as prescribed. Electronic Signature(s) Signed: 09/30/2020 1:05:09 PM By: Donnamarie Poag Signed: 09/30/2020 5:46:17 PM By: Worthy Keeler PA-C Entered By: Donnamarie Poag on 09/30/2020 11:53:13 Carachure, [name] ([id number]) -------------------------------------------------------------------------------- Problem List Details [name] Date of Service: 09/30/2020 11:00 AM Medical Record Number: [id number] Patient Account Number: [id number] Date of Birth/Sex: 1935-04-05 (85 y.o. M) Treating RN: Donnamarie Poag Primary Care Provider: Nobie Putnam Other Clinician: Referring Provider: Nobie Putnam Treating Provider/Extender: Skipper Cliche in Treatment: 17 Active Problems ICD-10  Encounter Code Description Active Date MDM Diagnosis L89.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, unstageable 06/03/2020 No Yes R29.6 Repeated falls 06/03/2020 No Yes Z79.01 Long term (current) use of anticoagulants 06/03/2020 No Yes Inactive Problems Resolved Problems Electronic Signature(s) Signed: 10/04/2020 4:50:27 PM By: Worthy Keeler PA-C Entered By: Worthy Keeler on 10/04/2020 16:50:26 Mikaelian, [name] ([id number]) -------------------------------------------------------------------------------- Progress Note Details [name] Date of Service: 09/30/2020 11:00 AM Medical Record Number: [id number] Patient Account Number: [id number] Date of Birth/Sex: 1934/06/04 (85 y.o. M) Treating RN: Donnamarie Poag Primary Care Provider: Nobie Putnam Other Clinician: Referring Provider: Nobie Putnam Treating Provider/Extender: Skipper Cliche in Treatment: 17 Subjective Chief Complaint Information obtained from Patient Right heel ulcer History of Present Illness (HPI) 06/03/2020 upon evaluation today patient actually presents for initial inspection here in our clinic after having gone to urgent care this morning where they were concerned about infection as well as a significant "softball sized wound with eschar" over the heel. With that being said the patient fortunately does not have an area that largely does have an area of eschar which is draining around the edges really not stable and I think we are getting to remove the eschar so we try to clean up the surface of the wound. Fortunately there is no evidence of active infection at this time. I do believe that he is going require some debridement both manually today with scissors and forceps as well as enzymatic/chemically with the medications. I really feel like that he has a good chance to get this to heal if he keep pressure off of it we did discuss that again today. He does note this has been present for about 2 months. He is not taking any of his current medications that we did put him on the list he tells me that he only occasionally takes them which is obviously not doing anything for him. He does have a cane but he does not use it and his ABI was 1.14 on the right. 06/10/2020 upon evaluation today patient appears to be doing well with regard to his wound all things considered. He still has a lot of necrotic tissue in the central portion of the wound. I think the Iodosorb is still the right thing to do and again the biggest issue is he quit using it as he and his wife felt like that the drainage following putting on the medicine was really "nasty". Nonetheless I explained that the weight Iodoflex breaks down or Iodosorb and that round does make it look really bad but it actually is not as bad as what it seems. In fact it  is the normal course of things. 06/24/2020 upon evaluation today patient appears to be doing unfortunately about the same in regard to his heel. I do not think this is any worse. However its become increasingly clear that he has significant dementia in my opinion. We have had multiple instances going on here from the standpoint of trying to get him to take the oral antibiotic. Initially we ordered it for him he picked it up on 1 afternoon and by the next morning we called to make sure that he had gotten it he told us yes but that he had thrown it away. That he was not to take any additional medications. We then told him he needed to be taken this to get the infection under control he subsequently look for it only found the bottle never took the medication therefore I sent  it in a second time for him. After sending this and it looks like he has not really taken it since that time he tells me that he took 7 pills at once although I do not think they were all of the same medication and then following this it made him sick I think these were all of his different medicines. He decided he was not to take any further medication after that point. Nonetheless I do believe he still is going require treatment for this infection the good news is he has a friend who is a very good friend that is with him here today to try to help with figuring out what to do and how going forward this should be taken care of. 07/01/2020 upon evaluation today patient appears to be doing really about the same. His measurements are maybe slightly smaller than previous. Fortunately there is no signs of active infection which is great news. No fevers, chills, nausea, vomiting, or diarrhea. 07/08/2020 upon evaluation today patient appears to be doing better in regard to his wounds. He looks like he finally actually took the Levaquin which has helped with the infection. I think things are doing much better in that regard which is great news.  No fevers, chills, nausea, vomiting, or diarrhea. Unfortunately he did have actual Kleenex/tissue paper over the wound on really not sure why his wife put that on there but nonetheless I think were ready to start wrapping this week. 07/15/2020 on evaluation today patient appears to be doing about the same in regard to his wound. Unfortunately there does not appear to be any significant improvement. With that being said he also came in today with the heel of his wrap completely hanging out where the wound was exposed and just rubbing on the bedroom slipper that he has on today. With that being said this is obviously not ideal. I discussed that with the patient I really think that he is getting need to be more careful and conscientious about what is going on with his wrap. With that being said some of this is to know followed on his own that he actually has in my opinion significant dementia which inhibits his ability to be able to comply. He has a friend with him today who is trying to help out as best he can I am just not certain that Mr. Maffeo is actually able to do what needs to be done here. 07/22/20 upon evaluation today patient appears to be doing well with regard to his wound is measuring a little bit smaller. With that being said he did not have any dressings on whatsoever upon evaluation today it was just gauze wrapped Curlex over his leg. Subsequently the Coban following this really was not the appropriate wrap and more importantly he had no dressing actually on the wound itself. Fortunately there does not appear to be any signs of active infection which is great news. Unfortunately my main concern here is that the patient needs to have appropriate dressings on in order to prevent infection and allow for continued and appropriate healing. 07/29/2020 upon evaluation today patient appears to be doing okay in regard to his wound. Unfortunately still do not have the Hydrofera Blue in place in fact it  appears that he had a wet-to-dry dressing again applied to the heel even after our conversation with the home health supervisor last week where they told us that that is what the nurse had put in place and that we have  been contacted to advise [place] that that was being done. We had never been contacted in any such regard prior to that being done. At that point we advised that until they get the [facility] in they could use a silver alginate dressing. Nonetheless again today the patient has a wet-to-dry dressing only on he is very macerated around the edges he is also very hyper granulated in regard to the wound bed both a direct result of inappropriate dressings being applied to this wound. That is a direct cause of issues going on with home health not following orders as prescribed by the clinic here. 08/05/2020 upon evaluation today patient appears to be doing excellent in regard to his wound. He has been tolerating the dressing changes without complication and overall very pleased with where things stand currently. With that being said the patient did not have the ABD pad in place and I really think he may actually be able to do better with XtraSorb even at this time. Nonetheless I think that if we can get this swelling Deveney, Aleksa D. ([id number]) under control and then turned the edema lessens is weeping then that will be a dramatic improvement. We can work on that currently. 08/19/2020 upon evaluation today patient appears to be doing excellent in regard to his heel ulcer. He has been tolerating the dressing changes without complication. I do feel like he is making good progress which is great news. There does not appear to be any signs of significant infection at this point at all which is also excellent. 08/26/2020 upon evaluation today patient appears to be doing excellent in regard to his heel ulcer. He is draining a lot but I think the big issue here is that he probably needs to be elevating his  legs a lot more to help with controlling some of this edema. Fortunately there is no evidence of active infection at this time which is great news. No fevers, chills, nausea, vomiting, or diarrhea. 09/02/2020 upon evaluation today patient appears to be doing well with regard to his wound. He is measuring better and the wound is looking much smaller today. I am very pleased with where things stand. He still has a lot of drainage but he tells me he has been trying to elevate his legs as much as possible which I think is helping to some degree. 5/23; wound on the right lateral foot. Using Hydrofera Blue 3 layer compression. 6/1; right lateral foot. Although this looks very healthy it is only slightly improved in terms of width. He is not wearing any offloading. The wound is lateral but very well could be in the weightbearing area 09/23/2020 upon evaluation today patient appears to be doing excellent in regard to his heel ulcer. He has been tolerating the dressing changes without complication. Fortunately there is no signs of infection at this point which is great news. No fevers, chills, nausea, vomiting, or diarrhea. 10/04/2020 upon evaluation today patient actually appears to be doing excellent in regard to his wound. There does not appear to be any signs of infection. Overall I am extremely pleased with where things stand. I think he is on the right track and getting very close to resolution. Objective Constitutional Well-nourished and well-hydrated in no acute distress. Vitals Time Taken: 11:10 AM, Height: 71 in, Weight: 216 lbs, BMI: 30.1, Temperature: 97.8 F, Pulse: 67 bpm, Respiratory Rate: 18 breaths/min, Blood Pressure: 158/79 mmHg. Respiratory normal breathing without difficulty. Psychiatric this patient is able to make decisions and  demonstrates good insight into disease process. Alert and Oriented x 3. pleasant and cooperative. General Notes: Upon inspection patient's wound bed showed  signs of good granulation epithelization at this point. There does not appear to be any evidence of active infection which is great and overall I am extremely pleased with where we stand today. Integumentary (Hair, Skin) Wound #1 status is Open. Original cause of wound was Pressure Injury. The date acquired was: 04/03/2020. The wound has been in treatment 17 weeks. The wound is located on the Right,Lateral Foot. The wound measures 1cm length x 1cm width x 0.1cm depth; 0.785cm^2 area and 0.079cm^3 volume. There is Fat Layer (Subcutaneous Tissue) exposed. There is no tunneling or undermining noted. There is a medium amount of serosanguineous drainage noted. The wound margin is thickened. There is large (67-100%) red, hyper - granulation within the wound bed. There is a small (1-33%) amount of necrotic tissue within the wound bed including Adherent Slough. Assessment Active Problems ICD-10 Pressure ulcer of right heel, unstageable Repeated falls Long term (current) use of anticoagulants Procedures Piatek, Laythan D. ([id number]) Wound #1 Pre-procedure diagnosis of Wound #1 is a Pressure Ulcer located on the Right,Lateral Foot . There was a Three Layer Compression Therapy Procedure by Donnamarie Poag, RN. Post procedure Diagnosis Wound #1: Same as Pre-Procedure Plan Follow-up Appointments: Return Appointment in 1 week. Home Health: [facility] for wound care. May utilize formulary equivalent dressing for wound treatment orders unless otherwise specified. Home Health Nurse may visit PRN to address patient s wound care needs. - Home [facility] to change on Wednesdays and Fridays , patient to be seen in clinic on [place] Orders/Instructions: - Please [place] OF HEALING Edema Control - Lymphedema / Segmental Compressive Device / Other: Optional: One layer of unna paste to top of compression wrap (to act  as an anchor). Elevate, Exercise Daily and Avoid Standing for Long Periods of Time. - AVOID WALKING MORE THAN NEEDED Elevate legs to the level of the heart and pump ankles as often as possible Elevate leg(s) parallel to the floor when sitting. Off-Loading: Open toe surgical shoe - Continue wearing on right foot WOUND #1: - Foot Wound Laterality: Right, Lateral Cleanser: Normal Saline (Generic) 2 x Per Week/30 Days Discharge Instructions: Wash your hands with soap and water. Remove old dressing, discard into plastic bag and place into trash. Cleanse the wound with Normal Saline prior to applying a clean dressing using gauze sponges, not tissues or cotton balls. Do not scrub or use excessive force. Pat dry using gauze sponges, not tissue or cotton balls. Primary Dressing: Hydrofera Blue Ready Transfer Foam, 2.5x2.5 (in/in) (Generic) 2 x Per Week/30 Days Discharge Instructions: Apply Hydrofera Blue Ready to wound bed as directed Secondary Dressing: ABD Pad 5x9 (in/in) 2 x Per Week/30 Days Discharge Instructions: Cover with ABD pad Compression Wrap: Profore Lite LF 3 Multilayer Compression Bandaging System (Generic) 2 x Per Week/30 Days Discharge Instructions: Apply 3 multi-layer wrap as prescribed. 1. I would suggest that we go ahead and initiate treatment with a continuation of the current dressings. I feel like that the [facility] is doing a great job and not such we will get a continue. 2. Will continue with ABD pad to cover followed by 3 layer compression wrap. We will see patient back for reevaluation in 1 week here in the clinic. If anything worsens or changes patient will contact our office for additional  recommendations. Electronic Signature(s) Signed: 10/04/2020 4:51:28 PM By: Worthy Keeler PA-C Entered By: Worthy Keeler on 10/04/2020 16:51:27 Sliney, [name] ([id number]) -------------------------------------------------------------------------------- SuperBill Details Patient  Name: [name] Date of Service: 09/30/2020 Medical Record Number: [id number] Patient Account Number: [id number] Date of Birth/Sex: 1934-07-23 (86 y.o. M) Treating RN: Donnamarie Poag Primary Care Provider: Nobie Putnam Other Clinician: Referring Provider: Nobie Putnam Treating Provider/Extender: Skipper Cliche in Treatment: 17 Diagnosis Coding ICD-10 Codes Code Description L89.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, unstageable R29.6 Repeated falls Z79.01 Long term (current) use of anticoagulants Facility Procedures CPT4 Code: [id number] Description: (Facility Use Only) [phone] - [place] RT LEG Modifier: Quantity: 1 Physician Procedures CPT4 Code: 1836725 Description: 50016 - WC PHYS LEVEL 3 - EST PT Modifier: Quantity: 1 CPT4 Code: Description: ICD-10 Diagnosis Description L89.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, unstageable R29.6 Repeated falls Z79.01 Long term (current) use of anticoagulants Modifier: Quantity: Electronic Signature(s) Signed: 10/04/2020 4:51:38 PM By: Worthy Keeler PA-C Previous Signature: 09/30/2020 1:05:09 PM Version By: Donnamarie Poag Previous Signature: 09/30/2020 5:46:17 PM Version By: Worthy Keeler PA-C Entered By: Worthy Keeler on 10/04/2020 16:51:37

## 2020-10-02 DIAGNOSIS — N183 Chronic kidney disease, stage 3 unspecified: Secondary | ICD-10-CM | POA: Diagnosis not present

## 2020-10-02 DIAGNOSIS — L89616 Pressure-induced deep tissue damage of right heel: Secondary | ICD-10-CM | POA: Diagnosis not present

## 2020-10-02 DIAGNOSIS — R300 Dysuria: Secondary | ICD-10-CM | POA: Diagnosis not present

## 2020-10-02 DIAGNOSIS — E785 Hyperlipidemia, unspecified: Secondary | ICD-10-CM | POA: Diagnosis not present

## 2020-10-02 DIAGNOSIS — I69315 Cognitive social or emotional deficit following cerebral infarction: Secondary | ICD-10-CM | POA: Diagnosis not present

## 2020-10-02 DIAGNOSIS — I129 Hypertensive chronic kidney disease with stage 1 through stage 4 chronic kidney disease, or unspecified chronic kidney disease: Secondary | ICD-10-CM | POA: Diagnosis not present

## 2020-10-02 NOTE — Progress Notes (Addendum)
Jonathan, Nguyen ([id number]) Visit Report for 09/30/2020 Arrival Information Details Patient Name: Jonathan, Nguyen. Date of Service: 09/30/2020 11:00 AM Medical Record Number: [id number] Patient Account Number: [id number] Date of Birth/Sex: November 08, 1934 (85 y.o. M) Treating RN: Carlene Coria Primary Care Tristy Udovich: Nobie Putnam Other Clinician: Referring Veria Stradley: Nobie Putnam Treating Atalie Oros/Extender: Skipper Cliche in Treatment: 70 Visit Information History Since Last Visit All ordered tests and consults were completed: No Patient Arrived: Ambulatory Added or deleted any medications: No Arrival Time: 11:06 Any new allergies or adverse reactions: No Accompanied By: self Had a fall or experienced change in No Transfer Assistance: None activities of daily living that may affect Patient Identification Verified: Yes risk of falls: Secondary Verification Process Completed: Yes Signs or symptoms of abuse/neglect since last visito No Patient Requires Transmission-Based Precautions: No Hospitalized since last visit: No Patient Has Alerts: Yes Implantable device outside of the clinic excluding No Patient Alerts: Not Diabetic cellular tissue based products placed in the center Plavix since last visit: Has Compression in Place as Prescribed: Yes Pain Present Now: No Electronic Signature(s) Signed: 10/02/2020 3:34:38 PM By: Carlene Coria RN Entered By: Carlene Coria on 09/30/2020 11:10:27 [name], [name] ([id number]) -------------------------------------------------------------------------------- Clinic Level of Care Assessment Details Patient Name: Jonathan, Nguyen. Date of Service: 09/30/2020 11:00 AM Medical Record Number: [id number] Patient Account Number: [id number] Date of Birth/Sex: 1935-04-10 (85 y.o. M) Treating RN: Donnamarie Poag Primary Care Ukiah Trawick: Nobie Putnam Other Clinician: Referring Millisa Giarrusso: Nobie Putnam Treating Danarius Mcconathy/Extender: Skipper Cliche in Treatment: 17 Clinic Level of Care Assessment Items TOOL 1 Quantity Score []  - Use when EandM and Procedure is performed on INITIAL visit 0 ASSESSMENTS - Nursing Assessment / Reassessment []  - General Physical Exam (combine w/ comprehensive assessment (listed just below) when performed on new 0 pt. evals) []  - 0 Comprehensive Assessment (HX, ROS, Risk Assessments, Wounds Hx, etc.) ASSESSMENTS - Wound and Skin Assessment / Reassessment []  - Dermatologic / Skin Assessment (not related to wound area) 0 ASSESSMENTS - Ostomy and/or Continence Assessment and Care []  - Incontinence Assessment and Management 0 []  - 0 Ostomy Care Assessment and Management (repouching, etc.) PROCESS - Coordination of Care []  - Simple Patient / Family Education for ongoing care 0 []  - 0 Complex (extensive) Patient / Family Education for ongoing care []  - 0 Staff obtains Programmer, systems, Records, Test Results / Process Orders []  - 0 Staff telephones HHA, Nursing Homes / Clarify orders / etc []  - 0 Routine Transfer to another Facility (non-emergent condition) []  - 0 Routine Hospital Admission (non-emergent condition) []  - 0 New Admissions / Biomedical engineer / Ordering NPWT, Apligraf, etc. []  - 0 Emergency Hospital Admission (emergent condition) PROCESS - Special Needs []  - Pediatric / Minor Patient Management 0 []  - 0 Isolation Patient Management []  - 0 Hearing / Language / Visual special needs []  - 0 Assessment of Community assistance (transportation, D/C planning, etc.) []  - 0 Additional assistance / Altered mentation []  - 0 Support Surface(s) Assessment (bed, cushion, seat, etc.) INTERVENTIONS - Miscellaneous []  - External ear exam 0 []  - 0 Patient Transfer (multiple staff / Civil Service fast streamer / Similar devices) []  - 0 Simple Staple / Suture removal (25 or less) []  - 0 Complex Staple / Suture removal (26 or more) []  - 0 Hypo/Hyperglycemic Management (do not check if billed  separately) []  - 0 Ankle / Brachial Index (ABI) - do not check if billed separately Has the patient been seen at the hospital within the last three years:  Yes Total Score: 0 Level Of Care: ____ [name] ([id number]) Electronic Signature(s) Signed: 09/30/2020 1:05:09 PM By: Donnamarie Poag Entered By: Donnamarie Poag on 09/30/2020 11:45:55 Dripps, [name] ([id number]) -------------------------------------------------------------------------------- Compression Therapy Details [name] Date of Service: 09/30/2020 11:00 AM Medical Record Number: [id number] Patient Account Number: [id number] Date of Birth/Sex: 01-20-1935 (85 y.o. M) Treating RN: Donnamarie Poag Primary Care Balthazar Dooly: Nobie Putnam Other Clinician: Referring Caige Almeda: Nobie Putnam Treating Micharl Helmes/Extender: Skipper Cliche in Treatment: 17 Compression Therapy Performed for Wound Assessment: Wound #1 Right,Lateral Foot Performed By: Junius Argyle, RN Compression Type: Three Layer Post Procedure Diagnosis Same as Pre-procedure Electronic Signature(s) Signed: 09/30/2020 1:05:09 PM By: Donnamarie Poag Entered By: Donnamarie Poag on 09/30/2020 11:44:50 [name], [name] ([id number]) -------------------------------------------------------------------------------- Encounter Discharge Information Details [name] Date of Service: 09/30/2020 11:00 AM Medical Record Number: [id number] Patient Account Number: [id number] Date of Birth/Sex: 03-09-1935 (85 y.o. M) Treating RN: Dolan Amen Primary Care Cindie Rajagopalan: Nobie Putnam Other Clinician: Referring Ajax Schroll: Nobie Putnam Treating Adaya Garmany/Extender: Skipper Cliche in Treatment: 17 Encounter Discharge Information Items Discharge Condition: Stable Ambulatory Status: Ambulatory Discharge Destination: Home Transportation: Private Auto Accompanied By: self Schedule Follow-up Appointment: Yes Clinical Summary  of Care: Electronic Signature(s) Signed: 09/30/2020 12:02:13 PM By: Georges Mouse, Minus Breeding RN Entered By: Georges Mouse, Minus Breeding on 09/30/2020 12:02:13 Marlar, [name] ([id number]) -------------------------------------------------------------------------------- Lower Extremity Assessment Details [name] Date of Service: 09/30/2020 11:00 AM Medical Record Number: [id number] Patient Account Number: [id number] Date of Birth/Sex: 1935/01/29 (85 y.o. M) Treating RN: Carlene Coria Primary Care Aleeyah Bensen: Nobie Putnam Other Clinician: Referring Jhanvi Drakeford: Nobie Putnam Treating Jayliah Benett/Extender: Skipper Cliche in Treatment: 17 Edema Assessment Assessed: [Left: No] Patrice Paradise: No] [Left: Edema] [Right: :] Calf Left: Right: Point of Measurement: 36 cm From Medial Instep 33 cm Ankle Left: Right: Point of Measurement: 12 cm From Medial Instep 21 cm Vascular Assessment Pulses: Dorsalis Pedis Palpable: [Right:Yes] Electronic Signature(s) Signed: 10/02/2020 3:34:38 PM By: Carlene Coria RN Entered By: Carlene Coria on 09/30/2020 11:17:45 Hargan, Sneijder DMarland Kitchen ([id number]) -------------------------------------------------------------------------------- Multi Wound Chart Details [name] Date of Service: 09/30/2020 11:00 AM Medical Record Number: [id number] Patient Account Number: [id number] Date of Birth/Sex: November 04, 1934 (85 y.o. M) Treating RN: Donnamarie Poag Primary Care Mikaila Grunert: Nobie Putnam Other Clinician: Referring Gredmarie Delange: Nobie Putnam Treating Dane Kopke/Extender: Skipper Cliche in Treatment: 17 Vital Signs Height(in): 71 Pulse(bpm): 63 Weight(lbs): 216 Blood Pressure(mmHg): 158/79 Body Mass Index(BMI): 30 Temperature(F): 97.8 Respiratory Rate(breaths/min): 18 Photos: [N/A:N/A] Wound Location: Right, Lateral Foot N/A N/A Wounding Event: Pressure Injury N/A N/A Primary Etiology: Pressure Ulcer N/A N/A Comorbid  History: Cataracts, Hypertension N/A N/A Date Acquired: 04/03/2020 N/A N/A Weeks of Treatment: 17 N/A N/A Wound Status: Open N/A N/A Measurements L x W x D (cm) 1x1x0.1 N/A N/A Area (cm) : 0.785 N/A N/A Volume (cm) : 0.079 N/A N/A % Reduction in Area: 96.50% N/A N/A % Reduction in Volume: 99.30% N/A N/A Classification: Unstageable/Unclassified N/A N/A Exudate Amount: Medium N/A N/A Exudate Type: Serosanguineous N/A N/A Exudate Color: red, brown N/A N/A Wound Margin: Thickened N/A N/A Granulation Amount: Large (67-100%) N/A N/A Granulation Quality: Red, Hyper-granulation N/A N/A Necrotic Amount: Small (1-33%) N/A N/A Exposed Structures: Fat Layer (Subcutaneous Tissue): N/A N/A Yes Fascia: No Tendon: No Muscle: No Joint: No Bone: No Epithelialization: Small (1-33%) N/A N/A Treatment Notes Electronic Signature(s) Signed: 09/30/2020 1:05:09 PM By: Donnamarie Poag Entered By: Donnamarie Poag on 09/30/2020 11:43:39 Nguyen, Jonathan D. ([id number]) -------------------------------------------------------------------------------- Multi-Disciplinary  Care Plan Details Patient Name: Jonathan, Nguyen. Date of Service: 09/30/2020 11:00 AM Medical Record Number: [id number] Patient Account Number: [id number] Date of Birth/Sex: [birth year] (85 y.o. M) Treating RN: Donnamarie Poag Primary Care Carrie Schoonmaker: Nobie Putnam Other Clinician: Referring Albie Arizpe: Nobie Putnam Treating Louvina Cleary/Extender: Skipper Cliche in Treatment: 17 Active Inactive Electronic Signature(s) Signed: 09/30/2020 1:05:09 PM By: Donnamarie Poag Entered By: Donnamarie Poag on 09/30/2020 11:43:32 [place], [name] ([id number]) -------------------------------------------------------------------------------- Pain Assessment Details [name] Date of Service: 09/30/2020 11:00 AM Medical Record Number: [id number] Patient Account Number: [id number] Date of Birth/Sex: Oct 19, 1934 (85 y.o. M) Treating RN: Carlene Coria Primary Care Veleta Yamamoto: Nobie Putnam Other Clinician: Referring Tannia Contino: Nobie Putnam Treating Redonna Wilbert/Extender: Skipper Cliche in Treatment: 17 Active Problems Location of Pain Severity and Description of Pain Patient Has Paino No Site Locations Pain Management and Medication Current Pain Management: Electronic Signature(s) Signed: 10/02/2020 3:34:38 PM By: Carlene Coria RN Entered By: Carlene Coria on 09/30/2020 11:10:50 Svec, [name] ([id number]) -------------------------------------------------------------------------------- Patient/Caregiver Education Details [name] Date of Service: 09/30/2020 11:00 AM Medical Record Number: [id number] Patient Account Number: [id number] Date of Birth/Gender: 06-12-1934 (85 y.o. M) Treating RN: Donnamarie Poag Primary Care Physician: Nobie Putnam Other Clinician: Referring Physician: Nobie Putnam Treating Physician/Extender: Skipper Cliche in Treatment: 17 Education Assessment Education Provided To: Patient Education Topics Provided Offloading: Wound/Skin Impairment: Electronic Signature(s) Signed: 09/30/2020 1:05:09 PM By: Donnamarie Poag Entered By: Donnamarie Poag on 09/30/2020 11:46:24 Hegel, [name] ([id number]) -------------------------------------------------------------------------------- Wound Assessment Details Patient Name: Jonathan Moros D. Date of Service: 09/30/2020 11:00 AM Medical Record Number: [id number] Patient Account Number: [id number] Date of Birth/Sex: 11/28/1934 (85 y.o. M) Treating RN: Carlene Coria Primary Care Dolton Shaker: Nobie Putnam Other Clinician: Referring Patsey Pitstick: Nobie Putnam Treating Shinichi Anguiano/Extender: Skipper Cliche in Treatment: 17 Wound Status Wound Number: 1 Primary Etiology: Pressure Ulcer Wound Location: Right, Lateral Foot Wound Status: Open Wounding Event: Pressure Injury Comorbid History: Cataracts,  Hypertension Date Acquired: 04/03/2020 Weeks Of Treatment: 17 Clustered Wound: No Photos Wound Measurements Length: (cm) 1 Width: (cm) 1 Depth: (cm) 0.1 Area: (cm) 0.785 Volume: (cm) 0.079 % Reduction in Area: 96.5% % Reduction in Volume: 99.3% Epithelialization: Small (1-33%) Tunneling: No Undermining: No Wound Description Classification: Unstageable/Unclassified Wound Margin: Thickened Exudate Amount: Medium Exudate Type: Serosanguineous Exudate Color: red, brown Foul Odor After Cleansing: No Slough/Fibrino Yes Wound Bed Granulation Amount: Large (67-100%) Exposed Structure Granulation Quality: Red, Hyper-granulation Fascia Exposed: No Necrotic Amount: Small (1-33%) Fat Layer (Subcutaneous Tissue) Exposed: Yes Necrotic Quality: Adherent Slough Tendon Exposed: No Muscle Exposed: No Joint Exposed: No Bone Exposed: No Treatment Notes Wound #1 (Foot) Wound Laterality: Right, Lateral Cleanser Normal Saline Discharge Instruction: Wash your hands with soap and water. Remove old dressing, discard into plastic bag and place into trash. Cleanse the wound with Normal Saline prior to applying a clean dressing using gauze sponges, not tissues or cotton balls. Do not scrub or use excessive force. Pat dry using gauze sponges, not tissue or cotton balls. Jonathan, Nguyen ([id number]) Peri-Wound Care Topical Primary Dressing Hydrofera Blue Ready Transfer Foam, 2.5x2.5 (in/in) Discharge Instruction: Apply Hydrofera Blue Ready to wound bed as directed Secondary Dressing ABD Pad 5x9 (in/in) Discharge Instruction: Cover with ABD pad Secured With Compression Wrap Profore Lite LF 3 Multilayer Compression [place] Discharge Instruction: Apply 3 multi-layer wrap as prescribed. Compression Stockings Add-Ons Electronic Signature(s) Signed: 10/02/2020 3:34:38 PM By: Carlene Coria RN Entered By: Carlene Coria on 09/30/2020 11:17:04 Nguyen, Jonathan D.  ([id number]) --------------------------------------------------------------------------------  Vitals Details Patient Name: Jonathan, Nguyen. Date of Service: 09/30/2020 11:00 AM Medical Record Number: [id number] Patient Account Number: [id number] Date of Birth/Sex: [birth year] (85 y.o. M) Treating RN: Carlene Coria Primary Care Zosia Lucchese: Nobie Putnam Other Clinician: Referring Eriko Economos: Nobie Putnam Treating Itzamar Traynor/Extender: Skipper Cliche in Treatment: 17 Vital Signs Time Taken: 11:10 Temperature (F): 97.8 Height (in): 71 Pulse (bpm): 67 Weight (lbs): 216 Respiratory Rate (breaths/min): 18 Body Mass Index (BMI): 30.1 Blood Pressure (mmHg): 158/79 Reference Range: 80 - 120 mg / dl Electronic Signature(s) Signed: 10/02/2020 3:34:38 PM By: Carlene Coria RN Entered By: Carlene Coria on 09/30/2020 11:10:44

## 2020-10-04 DIAGNOSIS — R300 Dysuria: Secondary | ICD-10-CM | POA: Diagnosis not present

## 2020-10-04 DIAGNOSIS — E785 Hyperlipidemia, unspecified: Secondary | ICD-10-CM | POA: Diagnosis not present

## 2020-10-04 DIAGNOSIS — I129 Hypertensive chronic kidney disease with stage 1 through stage 4 chronic kidney disease, or unspecified chronic kidney disease: Secondary | ICD-10-CM | POA: Diagnosis not present

## 2020-10-04 DIAGNOSIS — N183 Chronic kidney disease, stage 3 unspecified: Secondary | ICD-10-CM | POA: Diagnosis not present

## 2020-10-04 DIAGNOSIS — L89616 Pressure-induced deep tissue damage of right heel: Secondary | ICD-10-CM | POA: Diagnosis not present

## 2020-10-04 DIAGNOSIS — I69315 Cognitive social or emotional deficit following cerebral infarction: Secondary | ICD-10-CM | POA: Diagnosis not present

## 2020-10-07 ENCOUNTER — Encounter: Payer: Medicare Other | Admitting: Physician Assistant

## 2020-10-07 ENCOUNTER — Other Ambulatory Visit: Payer: Self-pay

## 2020-10-07 DIAGNOSIS — L89610 Pressure ulcer of right heel, unstageable: Secondary | ICD-10-CM | POA: Diagnosis not present

## 2020-10-07 DIAGNOSIS — Z7901 Long term (current) use of anticoagulants: Secondary | ICD-10-CM | POA: Diagnosis not present

## 2020-10-07 DIAGNOSIS — L89890 Pressure ulcer of other site, unstageable: Secondary | ICD-10-CM | POA: Diagnosis not present

## 2020-10-07 DIAGNOSIS — R296 Repeated falls: Secondary | ICD-10-CM | POA: Diagnosis not present

## 2020-10-07 DIAGNOSIS — Z09 Encounter for follow-up examination after completed treatment for conditions other than malignant neoplasm: Secondary | ICD-10-CM | POA: Diagnosis not present

## 2020-10-07 NOTE — Progress Notes (Addendum)
Jonathan, Nguyen ([id number]) Visit Report for 10/07/2020 Chief Complaint Document Details Patient Name: Jonathan, Nguyen. Date of Service: 10/07/2020 11:00 AM Medical Record Number: [id number] Patient Account Number: [id number] Date of Birth/Sex: Apr 02, 1935 (86 y.o. M) Treating RN: Donnamarie Poag Primary Care Provider: Nobie Putnam Other Clinician: Referring Provider: Nobie Putnam Treating Provider/Extender: Skipper Cliche in Treatment: 18 Information Obtained from: Patient Chief Complaint Right heel ulcer Electronic Signature(s) Signed: 10/07/2020 11:40:13 AM By: Worthy Keeler PA-C Entered By: Worthy Keeler on 10/07/2020 11:40:13 [name], [name] ([id number]) -------------------------------------------------------------------------------- HPI Details [name] Date of Service: 10/07/2020 11:00 AM Medical Record Number: [id number] Patient Account Number: [id number] Date of Birth/Sex: December 18, 1934 (86 y.o. M) Treating RN: Donnamarie Poag Primary Care Provider: Nobie Putnam Other Clinician: Referring Provider: Nobie Putnam Treating Provider/Extender: Skipper Cliche in Treatment: 18 History of Present Illness HPI Description: 06/03/2020 upon evaluation today patient actually presents for initial inspection here in our clinic after having gone to urgent care this morning where they were concerned about infection as well as a significant "softball sized wound with eschar" over the heel. With that being said the patient fortunately does not have an area that largely does have an area of eschar which is draining around the edges really not stable and I think we are getting to remove the eschar so we try to clean up the surface of the wound. Fortunately there is no evidence of active infection at this time. I do believe that he is going require some debridement both manually today with scissors and forceps as well as enzymatic/chemically with the  medications. I really feel like that he has a good chance to get this to heal if he keep pressure off of it we did discuss that again today. He does note this has been present for about 2 months. He is not taking any of his current medications that we did put him on the list he tells me that he only occasionally takes them which is obviously not doing anything for him. He does have a cane but he does not use it and his ABI was 1.14 on the right. 06/10/2020 upon evaluation today patient appears to be doing well with regard to his wound all things considered. He still has a lot of necrotic tissue in the central portion of the wound. I think the Iodosorb is still the right thing to do and again the biggest issue is he quit using it as he and his wife felt like that the drainage following putting on the medicine was really "nasty". Nonetheless I explained that the weight Iodoflex breaks down or Iodosorb and that round does make it look really bad but it actually is not as bad as what it seems. In fact it is the normal course of things. 06/24/2020 upon evaluation today patient appears to be doing unfortunately about the same in regard to his heel. I do not think this is any worse. However its become increasingly clear that he has significant dementia in my opinion. We have had multiple instances going on here from the standpoint of trying to get him to take the oral antibiotic. Initially we ordered it for him he picked it up on 1 afternoon and by the next morning we called to make sure that he had gotten it he told us yes but that he had thrown it away. That he was not to take any additional medications. We then told him he needed to be taken this  to get the infection under control he subsequently look for it only found the bottle never took the medication therefore I sent it in a second time for him. After sending this and it looks like he has not really taken it since that time he tells me that he took 7  pills at once although I do not think they were all of the same medication and then following this it made him sick I think these were all of his different medicines. He decided he was not to take any further medication after that point. Nonetheless I do believe he still is going require treatment for this infection the good news is he has a friend who is a very good friend that is with him here today to try to help with figuring out what to do and how going forward this should be taken care of. 07/01/2020 upon evaluation today patient appears to be doing really about the same. His measurements are maybe slightly smaller than previous. Fortunately there is no signs of active infection which is great news. No fevers, chills, nausea, vomiting, or diarrhea. 07/08/2020 upon evaluation today patient appears to be doing better in regard to his wounds. He looks like he finally actually took the Levaquin which has helped with the infection. I think things are doing much better in that regard which is great news. No fevers, chills, nausea, vomiting, or diarrhea. Unfortunately he did have actual Kleenex/tissue paper over the wound on really not sure why his wife put that on there but nonetheless I think were ready to start wrapping this week. 07/15/2020 on evaluation today patient appears to be doing about the same in regard to his wound. Unfortunately there does not appear to be any significant improvement. With that being said he also came in today with the heel of his wrap completely hanging out where the wound was exposed and just rubbing on the bedroom slipper that he has on today. With that being said this is obviously not ideal. I discussed that with the patient I really think that he is getting need to be more careful and conscientious about what is going on with his wrap. With that being said some of this is to know followed on his own that he actually has in my opinion significant dementia which inhibits  his ability to be able to comply. He has a friend with him today who is trying to help out as best he can I am just not certain that [name] is actually able to do what needs to be done here. 07/22/20 upon evaluation today patient appears to be doing well with regard to his wound is measuring a little bit smaller. With that being said he did not have any dressings on whatsoever upon evaluation today it was just gauze wrapped Curlex over his leg. Subsequently the Coban following this really was not the appropriate wrap and more importantly he had no dressing actually on the wound itself. Fortunately there does not appear to be any signs of active infection which is great news. Unfortunately my main concern here is that the patient needs to have appropriate dressings on in order to prevent infection and allow for continued and appropriate healing. 07/29/2020 upon evaluation today patient appears to be doing okay in regard to his wound. Unfortunately still do not have the Hydrofera Blue in place in fact it appears that he had a wet-to-dry dressing again applied to the heel even after our conversation with the home  health supervisor last week where they told us that that is what the nurse had put in place and that we have been contacted to advise [place] that that was being done. We had never been contacted in any such regard prior to that being done. At that point we advised that until they get the [facility] in they could use a silver alginate dressing. Nonetheless again today the patient has a wet-to-dry dressing only on he is very macerated around the edges he is also very hyper granulated in regard to the wound bed both a direct result of inappropriate dressings being applied to this wound. That is a direct cause of issues going on with home health not following orders as prescribed by the clinic here. 08/05/2020 upon evaluation today patient appears to be doing excellent in regard to his wound. He has  been tolerating the dressing changes without complication and overall very pleased with where things stand currently. With that being said the patient did not have the ABD pad in place and I really think he may actually be able to do better with XtraSorb even at this time. Nonetheless I think that if we can get this swelling under control and then turned the edema lessens is weeping then that will be a dramatic improvement. We can work on that currently. 08/19/2020 upon evaluation today patient appears to be doing excellent in regard to his heel ulcer. He has been tolerating the dressing changes without complication. I do feel like he is making good progress which is great news. There does not appear to be any signs of significant infection at this point at all which is also excellent. DEAIRE, MCWHIRTER ([id number]) 08/26/2020 upon evaluation today patient appears to be doing excellent in regard to his heel ulcer. He is draining a lot but I think the big issue here is that he probably needs to be elevating his legs a lot more to help with controlling some of this edema. Fortunately there is no evidence of active infection at this time which is great news. No fevers, chills, nausea, vomiting, or diarrhea. 09/02/2020 upon evaluation today patient appears to be doing well with regard to his wound. He is measuring better and the wound is looking much smaller today. I am very pleased with where things stand. He still has a lot of drainage but he tells me he has been trying to elevate his legs as much as possible which I think is helping to some degree. 5/23; wound on the right lateral foot. Using Hydrofera Blue 3 layer compression. 6/1; right lateral foot. Although this looks very healthy it is only slightly improved in terms of width. He is not wearing any offloading. The wound is lateral but very well could be in the weightbearing area 09/23/2020 upon evaluation today patient appears to be doing excellent in  regard to his heel ulcer. He has been tolerating the dressing changes without complication. Fortunately there is no signs of infection at this point which is great news. No fevers, chills, nausea, vomiting, or diarrhea. 10/04/2020 upon evaluation today patient actually appears to be doing excellent in regard to his wound. There does not appear to be any signs of infection. Overall I am extremely pleased with where things stand. I think he is on the right track and getting very close to resolution. 10/07/2020 upon evaluation today patient appears to be doing well with regard to his wound on the heel. Is been tolerating the dressing changes without complication. Fortunately  there is no signs of active infection at this time which is great and overall I am extremely pleased with where things stand. No need for sharp debridement today. Electronic Signature(s) Signed: 10/07/2020 5:53:00 PM By: Worthy Keeler PA-C Entered By: Worthy Keeler on 10/07/2020 17:53:00 Rodick, [name] ([id number]) -------------------------------------------------------------------------------- Physical Exam Details [name] Date of Service: 10/07/2020 11:00 AM Medical Record Number: [id number] Patient Account Number: [id number] Date of Birth/Sex: 31-Jul-1934 (85 y.o. M) Treating RN: Donnamarie Poag Primary Care Provider: Nobie Putnam Other Clinician: Referring Provider: Nobie Putnam Treating Provider/Extender: Skipper Cliche in Treatment: 81 Constitutional Well-nourished and well-hydrated in no acute distress. Respiratory normal breathing without difficulty. Psychiatric this patient is able to make decisions and demonstrates good insight into disease process. Alert and Oriented x 3. pleasant and cooperative. Notes Patient's wound bed showed signs of good granulation epithelization at this point. There does not appear to be any signs of active infection which is great news overall  very pleased with where things stand today. No fevers, chills, nausea, vomiting, or diarrhea. Electronic Signature(s) Signed: 10/07/2020 5:53:14 PM By: Worthy Keeler PA-C Entered By: Worthy Keeler on 10/07/2020 17:53:14 Kackley, [name] ([id number]) -------------------------------------------------------------------------------- Physician Orders Details [name] Date of Service: 10/07/2020 11:00 AM Medical Record Number: [id number] Patient Account Number: [id number] Date of Birth/Sex: 05/21/1934 (85 y.o. M) Treating RN: Donnamarie Poag Primary Care Provider: Nobie Putnam Other Clinician: Referring Provider: Nobie Putnam Treating Provider/Extender: Skipper Cliche in Treatment: 18 Verbal / Phone Orders: No Diagnosis Coding ICD-10 Coding Code Description L89.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, unstageable R29.6 Repeated falls Z79.01 Long term (current) use of anticoagulants Follow-up Appointments o Return Appointment in 1 week. [place] for wound care. May utilize formulary equivalent dressing for wound treatment orders unless otherwise specified. Home Health Nurse may visit PRN to address patientos wound care needs. - Home Health[facility] to change on Wednesdays and Fridays , patient to be seen in clinic on [place] Orders/Instructions: - Please [place] OF HEALING Edema Control - Lymphedema / Segmental Compressive Device / Other o Optional: One layer of unna paste to top of compression wrap (to act as an anchor). o Elevate, Exercise Daily and Avoid Standing for Long Periods of Time. - AVOID WALKING MORE THAN NEEDED o Elevate legs to the level of the heart and pump ankles as often as possible o Elevate leg(s) parallel to the floor when sitting. Off-Loading o Open toe surgical shoe - Continue wearing on right foot Wound  Treatment Wound #1 - Foot Wound Laterality: Right, Lateral Cleanser: Normal Saline (Generic) 2 x Per Week/30 Days Discharge Instructions: Wash your hands with soap and water. Remove old dressing, discard into plastic bag and place into trash. Cleanse the wound with Normal Saline prior to applying a clean dressing using gauze sponges, not tissues or cotton balls. Do not scrub or use excessive force. Pat dry using gauze sponges, not tissue or cotton balls. Primary Dressing: Hydrofera Blue Ready Transfer Foam, 2.5x2.5 (in/in) (Generic) 2 x Per Week/30 Days Discharge Instructions: Apply Hydrofera Blue Ready to wound bed as directed Secondary Dressing: ABD Pad 5x9 (in/in) 2 x Per Week/30 Days Discharge Instructions: Cover with ABD pad Compression Wrap: Profore Lite LF 3 Multilayer Compression Bandaging System (Generic) 2 x Per Week/30 Days Discharge Instructions: Apply 3 multi-layer wrap as prescribed. Electronic Signature(s) Signed: 10/07/2020 3:06:19  PM By: Donnamarie Poag Signed: 10/07/2020 6:32:22 PM By: Worthy Keeler PA-C Entered By: Donnamarie Poag on 10/07/2020 11:59:50 Pieper, [name] ([id number]) -------------------------------------------------------------------------------- Problem List Details [name] Date of Service: 10/07/2020 11:00 AM Medical Record Number: [id number] Patient Account Number: [id number] Date of Birth/Sex: [birth year] (85 y.o. M) Treating RN: Donnamarie Poag Primary Care Provider: Nobie Putnam Other Clinician: Referring Provider: Nobie Putnam Treating Provider/Extender: Skipper Cliche in Treatment: 18 Active Problems ICD-10 Encounter Code Description Active Date MDM Diagnosis L89.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, unstageable 06/03/2020 No Yes R29.6 Repeated falls 06/03/2020 No Yes Z79.01 Long term (current) use of anticoagulants 06/03/2020 No Yes Inactive Problems Resolved Problems Electronic Signature(s) Signed: 10/07/2020  11:40:05 AM By: Worthy Keeler PA-C Entered By: Worthy Keeler on 10/07/2020 11:40:05 [place], [name] ([id number]) -------------------------------------------------------------------------------- Progress Note Details [name] Date of Service: 10/07/2020 11:00 AM Medical Record Number: [id number] Patient Account Number: [id number] Date of Birth/Sex: [birth year] (85 y.o. M) Treating RN: Donnamarie Poag Primary Care Provider: Nobie Putnam Other Clinician: Referring Provider: Nobie Putnam Treating Provider/Extender: Skipper Cliche in Treatment: 18 Subjective Chief Complaint Information obtained from Patient Right heel ulcer History of Present Illness (HPI) 06/03/2020 upon evaluation today patient actually presents for initial inspection here in our clinic after having gone to urgent care this morning where they were concerned about infection as well as a significant "softball sized wound with eschar" over the heel. With that being said the patient fortunately does not have an area that largely does have an area of eschar which is draining around the edges really not stable and I think we are getting to remove the eschar so we try to clean up the surface of the wound. Fortunately there is no evidence of active infection at this time. I do believe that he is going require some debridement both manually today with scissors and forceps as well as enzymatic/chemically with the medications. I really feel like that he has a good chance to get this to heal if he keep pressure off of it we did discuss that again today. He does note this has been present for about 2 months. He is not taking any of his current medications that we did put him on the list he tells me that he only occasionally takes them which is obviously not doing anything for him. He does have a cane but he does not use it and his ABI was 1.14 on the right. 06/10/2020 upon evaluation today patient  appears to be doing well with regard to his wound all things considered. He still has a lot of necrotic tissue in the central portion of the wound. I think the Iodosorb is still the right thing to do and again the biggest issue is he quit using it as he and his wife felt like that the drainage following putting on the medicine was really "nasty". Nonetheless I explained that the weight Iodoflex breaks down or Iodosorb and that round does make it look really bad but it actually is not as bad as what it seems. In fact it is the normal course of things. 06/24/2020 upon evaluation today patient appears to be doing unfortunately about the same in regard to his heel. I do not think this is any worse. However its become increasingly clear that he has significant dementia in my opinion. We have had multiple instances going on here from the standpoint of trying to get him to take the oral antibiotic.  Initially we ordered it for him he picked it up on 1 afternoon and by the next morning we called to make sure that he had gotten it he told us yes but that he had thrown it away. That he was not to take any additional medications. We then told him he needed to be taken this to get the infection under control he subsequently look for it only found the bottle never took the medication therefore I sent it in a second time for him. After sending this and it looks like he has not really taken it since that time he tells me that he took 7 pills at once although I do not think they were all of the same medication and then following this it made him sick I think these were all of his different medicines. He decided he was not to take any further medication after that point. Nonetheless I do believe he still is going require treatment for this infection the good news is he has a friend who is a very good friend that is with him here today to try to help with figuring out what to do and how going forward this should be taken  care of. 07/01/2020 upon evaluation today patient appears to be doing really about the same. His measurements are maybe slightly smaller than previous. Fortunately there is no signs of active infection which is great news. No fevers, chills, nausea, vomiting, or diarrhea. 07/08/2020 upon evaluation today patient appears to be doing better in regard to his wounds. He looks like he finally actually took the Levaquin which has helped with the infection. I think things are doing much better in that regard which is great news. No fevers, chills, nausea, vomiting, or diarrhea. Unfortunately he did have actual Kleenex/tissue paper over the wound on really not sure why his wife put that on there but nonetheless I think were ready to start wrapping this week. 07/15/2020 on evaluation today patient appears to be doing about the same in regard to his wound. Unfortunately there does not appear to be any significant improvement. With that being said he also came in today with the heel of his wrap completely hanging out where the wound was exposed and just rubbing on the bedroom slipper that he has on today. With that being said this is obviously not ideal. I discussed that with the patient I really think that he is getting need to be more careful and conscientious about what is going on with his wrap. With that being said some of this is to know followed on his own that he actually has in my opinion significant dementia which inhibits his ability to be able to comply. He has a friend with him today who is trying to help out as best he can I am just not certain that [name] is actually able to do what needs to be done here. 07/22/20 upon evaluation today patient appears to be doing well with regard to his wound is measuring a little bit smaller. With that being said he did not have any dressings on whatsoever upon evaluation today it was just gauze wrapped Curlex over his leg. Subsequently the Coban following this  really was not the appropriate wrap and more importantly he had no dressing actually on the wound itself. Fortunately there does not appear to be any signs of active infection which is great news. Unfortunately my main concern here is that the patient needs to have appropriate dressings on in  order to prevent infection and allow for continued and appropriate healing. 07/29/2020 upon evaluation today patient appears to be doing okay in regard to his wound. Unfortunately still do not have the Hydrofera Blue in place in fact it appears that he had a wet-to-dry dressing again applied to the heel even after our conversation with the home health supervisor last week where they told us that that is what the nurse had put in place and that we have been contacted to advise [place] that that was being done. We had never been contacted in any such regard prior to that being done. At that point we advised that until they get the [facility] in they could use a silver alginate dressing. Nonetheless again today the patient has a wet-to-dry dressing only on he is very macerated around the edges he is also very hyper granulated in regard to the wound bed both a direct result of inappropriate dressings being applied to this wound. That is a direct cause of issues going on with home health not following orders as prescribed by the clinic here. 08/05/2020 upon evaluation today patient appears to be doing excellent in regard to his wound. He has been tolerating the dressing changes without complication and overall very pleased with where things stand currently. With that being said the patient did not have the ABD pad in place and I really think he may actually be able to do better with XtraSorb even at this time. Nonetheless I think that if we can get this swelling [name], Jonathan D. ([id number]) under control and then turned the edema lessens is weeping then that will be a dramatic improvement. We can work on that  currently. 08/19/2020 upon evaluation today patient appears to be doing excellent in regard to his heel ulcer. He has been tolerating the dressing changes without complication. I do feel like he is making good progress which is great news. There does not appear to be any signs of significant infection at this point at all which is also excellent. 08/26/2020 upon evaluation today patient appears to be doing excellent in regard to his heel ulcer. He is draining a lot but I think the big issue here is that he probably needs to be elevating his legs a lot more to help with controlling some of this edema. Fortunately there is no evidence of active infection at this time which is great news. No fevers, chills, nausea, vomiting, or diarrhea. 09/02/2020 upon evaluation today patient appears to be doing well with regard to his wound. He is measuring better and the wound is looking much smaller today. I am very pleased with where things stand. He still has a lot of drainage but he tells me he has been trying to elevate his legs as much as possible which I think is helping to some degree. 5/23; wound on the right lateral foot. Using Hydrofera Blue 3 layer compression. 6/1; right lateral foot. Although this looks very healthy it is only slightly improved in terms of width. He is not wearing any offloading. The wound is lateral but very well could be in the weightbearing area 09/23/2020 upon evaluation today patient appears to be doing excellent in regard to his heel ulcer. He has been tolerating the dressing changes without complication. Fortunately there is no signs of infection at this point which is great news. No fevers, chills, nausea, vomiting, or diarrhea. 10/04/2020 upon evaluation today patient actually appears to be doing excellent in regard to his wound. There does not  appear to be any signs of infection. Overall I am extremely pleased with where things stand. I think he is on the right track and getting very  close to resolution. 10/07/2020 upon evaluation today patient appears to be doing well with regard to his wound on the heel. Is been tolerating the dressing changes without complication. Fortunately there is no signs of active infection at this time which is great and overall I am extremely pleased with where things stand. No need for sharp debridement today. Objective Constitutional Well-nourished and well-hydrated in no acute distress. Vitals Time Taken: 11:24 AM, Height: 71 in, Weight: 216 lbs, BMI: 30.1, Temperature: 97.6 F, Pulse: 73 bpm, Respiratory Rate: 18 breaths/min, Blood Pressure: 154/83 mmHg. Respiratory normal breathing without difficulty. Psychiatric this patient is able to make decisions and demonstrates good insight into disease process. Alert and Oriented x 3. pleasant and cooperative. General Notes: Patient's wound bed showed signs of good granulation epithelization at this point. There does not appear to be any signs of active infection which is great news overall very pleased with where things stand today. No fevers, chills, nausea, vomiting, or diarrhea. Integumentary (Hair, Skin) Wound #1 status is Open. Original cause of wound was Pressure Injury. The date acquired was: 04/03/2020. The wound has been in treatment 18 weeks. The wound is located on the Right,Lateral Foot. The wound measures 0.5cm length x 0.5cm width x 0.1cm depth; 0.196cm^2 area and 0.02cm^3 volume. There is Fat Layer (Subcutaneous Tissue) exposed. There is no tunneling or undermining noted. There is a small amount of serosanguineous drainage noted. The wound margin is thickened. There is large (67-100%) red, hyper - granulation within the wound bed. There is no necrotic tissue within the wound bed. Assessment Active Problems ICD-10 Pressure ulcer of right heel, unstageable Repeated falls Long term (current) use of anticoagulants Nguyen, Jonathan D. ([id number]) Procedures Wound #1 Pre-procedure  diagnosis of Wound #1 is a Pressure Ulcer located on the Right,Lateral Foot . There was a Three Layer Compression Therapy Procedure by Donnamarie Poag, RN. Post procedure Diagnosis Wound #1: Same as Pre-Procedure Plan Follow-up Appointments: Return Appointment in 1 week. Home Health: [facility] for wound care. May utilize formulary equivalent dressing for wound treatment orders unless otherwise specified. Home Health Nurse may visit PRN to address patient s wound care needs. - Home Health[facility] to change on Wednesdays and Fridays , patient to be seen in clinic on [place] Orders/Instructions: - Please [place] OF HEALING Edema Control - Lymphedema / Segmental Compressive Device / Other: Optional: One layer of unna paste to top of compression wrap (to act as an anchor). Elevate, Exercise Daily and Avoid Standing for Long Periods of Time. - AVOID WALKING MORE THAN NEEDED Elevate legs to the level of the heart and pump ankles as often as possible Elevate leg(s) parallel to the floor when sitting. Off-Loading: Open toe surgical shoe - Continue wearing on right foot WOUND #1: - Foot Wound Laterality: Right, Lateral Cleanser: Normal Saline (Generic) 2 x Per Week/30 Days Discharge Instructions: Wash your hands with soap and water. Remove old dressing, discard into plastic bag and place into trash. Cleanse the wound with Normal Saline prior to applying a clean dressing using gauze sponges, not tissues or cotton balls. Do not scrub or use excessive force. Pat dry using gauze sponges, not tissue or cotton balls. Primary Dressing: Hydrofera Blue Ready Transfer Foam, 2.5x2.5 (in/in) (Generic) 2 x Per  Week/30 Days Discharge Instructions: Apply Hydrofera Blue Ready to wound bed as directed Secondary Dressing: ABD Pad 5x9 (in/in) 2 x Per Week/30 Days Discharge Instructions: Cover with ABD pad Compression Wrap: Profore  Lite LF 3 Multilayer Compression Bandaging System (Generic) 2 x Per Week/30 Days Discharge Instructions: Apply 3 multi-layer wrap as prescribed. 1. Would suggest that we going continue with the [facility] which I feel like is doing a great job. The patient is in agreement with that plan. 2. I am also can recommend that we have the patient continue to monitor for any signs of worsening infection. If anything occurs or changes he should let me know soon as possible. Otherwise we will continue with the compression wrap. We have been using a 3 layer compression wrap. We will see patient back for reevaluation in 1 week here in the clinic. If anything worsens or changes patient will contact our office for additional recommendations. Electronic Signature(s) Signed: 10/07/2020 5:53:56 PM By: Worthy Keeler PA-C Entered By: Worthy Keeler on 10/07/2020 17:53:56 Wilcoxen, [name] ([id number]) -------------------------------------------------------------------------------- SuperBill Details [name] Date of Service: 10/07/2020 Medical Record Number: [id number] Patient Account Number: [id number] Date of Birth/Sex: May 25, 1934 (86 y.o. M) Treating RN: Donnamarie Poag Primary Care Provider: Nobie Putnam Other Clinician: Referring Provider: Nobie Putnam Treating Provider/Extender: Skipper Cliche in Treatment: 18 Diagnosis Coding ICD-10 Codes Code Description L89.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, unstageable R29.6 Repeated falls Z79.01 Long term (current) use of anticoagulants Facility Procedures CPT4 Code: 46047998 Description: (Facility Use Only) [id number] - [employer] RT LEG Modifier: Quantity: 1 Physician Procedures CPT4 Code: 7618485 Description: 92763 - WC PHYS LEVEL 3 - EST PT Modifier: Quantity: 1 CPT4 Code: Description: ICD-10 Diagnosis Description L89.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, unstageable R29.6 Repeated falls Z79.01 Long term  (current) use of anticoagulants Modifier: Quantity: Electronic Signature(s) Signed: 10/07/2020 6:07:04 PM By: Worthy Keeler PA-C Previous Signature: 10/07/2020 3:06:19 PM Version By: Donnamarie Poag Entered By: Worthy Keeler on 10/07/2020 18:07:04

## 2020-10-09 DIAGNOSIS — I69315 Cognitive social or emotional deficit following cerebral infarction: Secondary | ICD-10-CM | POA: Diagnosis not present

## 2020-10-09 DIAGNOSIS — E785 Hyperlipidemia, unspecified: Secondary | ICD-10-CM | POA: Diagnosis not present

## 2020-10-09 DIAGNOSIS — N183 Chronic kidney disease, stage 3 unspecified: Secondary | ICD-10-CM | POA: Diagnosis not present

## 2020-10-09 DIAGNOSIS — L89616 Pressure-induced deep tissue damage of right heel: Secondary | ICD-10-CM | POA: Diagnosis not present

## 2020-10-09 DIAGNOSIS — R300 Dysuria: Secondary | ICD-10-CM | POA: Diagnosis not present

## 2020-10-09 DIAGNOSIS — I129 Hypertensive chronic kidney disease with stage 1 through stage 4 chronic kidney disease, or unspecified chronic kidney disease: Secondary | ICD-10-CM | POA: Diagnosis not present

## 2020-10-09 NOTE — Progress Notes (Signed)
BAYLIN, [name] ([id number]) Visit Report for 10/07/2020 Arrival Information Details [name], [name] Date of Service: 10/07/2020 11:00 AM Medical Record Number: [id number] Patient Account Number: [id number] Date of Birth/Sex: [birth year] (86 y.o. M) Treating RN: Carlene Coria Primary Care Wisam Siefring: Nobie Putnam Other Clinician: Referring Ryeleigh Santore: Nobie Putnam Treating Adele Milson/Extender: Skipper Cliche in Treatment: 18 Visit Information History Since Last Visit All ordered tests and consults were completed: No Patient Arrived: Ambulatory Added or deleted any medications: No Arrival Time: 11:24 Any new allergies or adverse reactions: No Accompanied By: self Had a fall or experienced change in No Transfer Assistance: None activities of daily living that may affect Patient Identification Verified: Yes risk of falls: Secondary Verification Process Completed: Yes Signs or symptoms of abuse/neglect since last visito No Patient Requires Transmission-Based Precautions: No Hospitalized since last visit: No Patient Has Alerts: Yes Implantable device outside of the clinic excluding No Patient Alerts: Not Diabetic cellular tissue based products placed in the center Plavix since last visit: Has Dressing in Place as Prescribed: Yes Has Compression in Place as Prescribed: Yes Pain Present Now: No Electronic Signature(s) Signed: 10/09/2020 4:27:57 PM By: Carlene Coria RN Entered By: Carlene Coria on 10/07/2020 11:24:29 Gault, Treyvone Keturah Barre ([id number]) -------------------------------------------------------------------------------- Clinic Level of Care Assessment Details [name] Date of Service: 10/07/2020 11:00 AM Medical Record Number: [id number] Patient Account Number: [id number] Date of Birth/Sex: 07/10/1944 (86 y.o. M) Treating RN: Donnamarie Poag Primary Care Lura Falor: Nobie Putnam Other Clinician: Referring Lamarr Feenstra: Nobie Putnam Treating Dilraj Killgore/Extender: Skipper Cliche in Treatment: 18 Clinic Level of Care Assessment Items TOOL 1 Quantity Score []  - Use when EandM and Procedure is performed on INITIAL visit 0 ASSESSMENTS - Nursing Assessment / Reassessment []  - General Physical Exam (combine w/ comprehensive assessment (listed just below) when performed on new 0 pt. evals) []  - 0 Comprehensive Assessment (HX, ROS, Risk Assessments, Wounds Hx, etc.) ASSESSMENTS - Wound and Skin Assessment / Reassessment []  - Dermatologic / Skin Assessment (not related to wound area) 0 ASSESSMENTS - Ostomy and/or Continence Assessment and Care []  - Incontinence Assessment and Management 0 []  - 0 Ostomy Care Assessment and Management (repouching, etc.) PROCESS - Coordination of Care []  - Simple Patient / Family Education for ongoing care 0 []  - 0 Complex (extensive) Patient / Family Education for ongoing care []  - 0 Staff obtains Programmer, systems, Records, Test Results / Process Orders []  - 0 Staff telephones HHA, Nursing Homes / Clarify orders / etc []  - 0 Routine Transfer to another Facility (non-emergent condition) []  - 0 Routine Hospital Admission (non-emergent condition) []  - 0 New Admissions / Biomedical engineer / Ordering NPWT, Apligraf, etc. []  - 0 Emergency Hospital Admission (emergent condition) PROCESS - Special Needs []  - Pediatric / Minor Patient Management 0 []  - 0 Isolation Patient Management []  - 0 Hearing / Language / Visual special needs []  - 0 Assessment of Community assistance (transportation, D/C planning, etc.) []  - 0 Additional assistance / Altered mentation []  - 0 Support Surface(s) Assessment (bed, cushion, seat, etc.) INTERVENTIONS - Miscellaneous []  - External ear exam 0 []  - 0 Patient Transfer (multiple staff / Civil Service fast streamer / Similar devices) []  - 0 Simple Staple / Suture removal (25 or less) []  - 0 Complex Staple / Suture removal (26 or more) []  -  0 Hypo/Hyperglycemic Management (do not check if billed separately) []  - 0 Ankle / Brachial Index (ABI) - do not check if billed separately Has the patient been seen at  the hospital within the last three years: Yes Total Score: 0 Level Of Care: ____ [name] ([id number]) Electronic Signature(s) Signed: 10/07/2020 3:06:19 PM By: Donnamarie Poag Entered By: Donnamarie Poag on 10/07/2020 12:00:00 Carrasco, [name] ([id number]) -------------------------------------------------------------------------------- Compression Therapy Details [name] Date of Service: 10/07/2020 11:00 AM Medical Record Number: [id number] Patient Account Number: [id number] Date of Birth/Sex: 1936/84/20 (86 y.o. M) Treating RN: Donnamarie Poag Primary Care Giavana Rooke: Nobie Putnam Other Clinician: Referring Zacharey Jensen: Nobie Putnam Treating Tristian Bouska/Extender: Skipper Cliche in Treatment: 18 Compression Therapy Performed for Wound Assessment: Wound #1 Right,Lateral Foot Performed By: Junius Argyle, RN Compression Type: Three Layer Post Procedure Diagnosis Same as Pre-procedure Electronic Signature(s) Signed: 10/07/2020 3:06:19 PM By: Donnamarie Poag Entered By: Donnamarie Poag on 10/07/2020 11:59:11 Pourciau, [name] ([id number]) -------------------------------------------------------------------------------- Encounter Discharge Information Details [name] Date of Service: 10/07/2020 11:00 AM Medical Record Number: [id number] Patient Account Number: [id number] Date of Birth/Sex: 07-16-1980 (86 y.o. M) Treating RN: Carlene Coria Primary Care Ismael Treptow: Nobie Putnam Other Clinician: Referring Mackson Botz: Nobie Putnam Treating Aryeh Butterfield/Extender: Skipper Cliche in Treatment: 18 Encounter Discharge Information Items Discharge Condition: Stable Ambulatory Status: Ambulatory Discharge Destination: Home Transportation: Private Auto Accompanied By:  self Schedule Follow-up Appointment: Yes Clinical Summary of Care: Patient Declined Electronic Signature(s) Signed: 10/09/2020 4:27:57 PM By: Carlene Coria RN Entered By: Carlene Coria on 10/07/2020 12:11:48 Cerino, Aldwin Keturah Barre ([id number]) -------------------------------------------------------------------------------- Lower Extremity Assessment Details [name] Date of Service: 10/07/2020 11:00 AM Medical Record Number: [id number] Patient Account Number: [id number] Date of Birth/Sex: 01-23-1935 (85 y.o. M) Treating RN: Carlene Coria Primary Care Kayonna Lawniczak: Nobie Putnam Other Clinician: Referring Jlynn Langille: Nobie Putnam Treating [place] Oborn/Extender: Skipper Cliche in Treatment: 18 Edema Assessment Assessed: [Left: No] Patrice Paradise: No] [Left: Edema] [Right: :] Calf Left: Right: Point of Measurement: 36 cm From Medial Instep 32 cm Ankle Left: Right: Point of Measurement: 12 cm From Medial Instep 20 cm Vascular Assessment Pulses: Dorsalis Pedis Palpable: [Right:Yes] Electronic Signature(s) Signed: 10/09/2020 4:27:57 PM By: Carlene Coria RN Entered By: Carlene Coria on 10/07/2020 11:29:42 Foote, Jaedin DMarland Kitchen ([id number]) -------------------------------------------------------------------------------- Multi Wound Chart Details Patient Name: [name] Date of Service: 10/07/2020 11:00 AM Medical Record Number: [id number] Patient Account Number: [id number] Date of Birth/Sex: August 06, 1934 (85 y.o. M) Treating RN: Donnamarie Poag Primary Care Blossom Crume: Nobie Putnam Other Clinician: Referring Quinlynn Cuthbert: Nobie Putnam Treating Nocole Zammit/Extender: Skipper Cliche in Treatment: 18 Vital Signs Height(in): 71 Pulse(bpm): 58 Weight(lbs): 216 Blood Pressure(mmHg): 154/83 Body Mass Index(BMI): 30 Temperature(F): 97.6 Respiratory Rate(breaths/min): 18 Photos: [1:No Photos] [N/A:N/A] Wound Location: [1:Right, Lateral Foot] [N/A:N/A] Wounding Event:  [1:Pressure Injury] [N/A:N/A] Primary Etiology: [1:Pressure Ulcer] [N/A:N/A] Comorbid History: [1:Cataracts, Hypertension] [N/A:N/A] Date Acquired: [1:04/03/2020] [N/A:N/A] Weeks of Treatment: [1:18] [N/A:N/A] Wound Status: [1:Open] [N/A:N/A] Measurements L x W x D (cm) [1:0.5x0.5x0.1] [N/A:N/A] Area (cm) : [1:0.196] [N/A:N/A] Volume (cm) : [1:0.02] [N/A:N/A] % Reduction in Area: [1:99.10%] [N/A:N/A] % Reduction in Volume: [1:99.80%] [N/A:N/A] Classification: [1:Unstageable/Unclassified] [N/A:N/A] Exudate Amount: [1:Small] [N/A:N/A] Exudate Type: [1:Serosanguineous] [N/A:N/A] Exudate Color: [1:red, brown] [N/A:N/A] Wound Margin: [1:Thickened] [N/A:N/A] Granulation Amount: [1:Large (67-100%)] [N/A:N/A] Granulation Quality: [1:Red, Hyper-granulation] [N/A:N/A] Necrotic Amount: [1:None Present (0%)] [N/A:N/A] Exposed Structures: [1:Fat Layer (Subcutaneous Tissue): Yes Fascia: No Tendon: No Muscle: No Joint: No Bone: No Small (1-33%)] [N/A:N/A N/A] Treatment Notes Electronic Signature(s) Signed: 10/07/2020 3:06:19 PM By: Donnamarie Poag Entered By: Donnamarie Poag on 10/07/2020 11:58:56 [place]oint, [name] ([id number]) -------------------------------------------------------------------------------- [place] Details [name] Date of Service: 10/07/2020 11:00  AM Medical Record Number: [id number] Patient Account Number: [id number] Date of Birth/Sex: 08/24/1934 (85 y.o. M) Treating RN: Donnamarie Poag Primary Care Lina Hitch: Nobie Putnam Other Clinician: Referring Nizhoni Parlow: Nobie Putnam Treating Anh Bigos/Extender: Skipper Cliche in Treatment: 18 Active Inactive Electronic Signature(s) Signed: 10/07/2020 3:06:19 PM By: Donnamarie Poag Entered By: Donnamarie Poag on 10/07/2020 11:58:48 Rehfeldt, Jakwan DMarland Kitchen ([id number]) -------------------------------------------------------------------------------- Pain Assessment Details [name] Date  of Service: 10/07/2020 11:00 AM Medical Record Number: [id number] Patient Account Number: [id number] Date of Birth/Sex: [birth year] (85 y.o. M) Treating RN: Carlene Coria Primary Care Jamela Cumbo: Nobie Putnam Other Clinician: Referring Gwen Sarvis: Nobie Putnam Treating Kileigh Ortmann/Extender: Skipper Cliche in Treatment: 18 Active Problems Location of Pain Severity and Description of Pain Patient Has Paino No Site Locations Pain Management and Medication Current Pain Management: Electronic Signature(s) Signed: 10/09/2020 4:27:57 PM By: Carlene Coria RN Entered By: Carlene Coria on 10/07/2020 11:24:51 Baack, [name] ([id number]) -------------------------------------------------------------------------------- Patient/Caregiver Education Details [name] Date of Service: 10/07/2020 11:00 AM Medical Record Number: [id number] Patient Account Number: [id number] Date of Birth/Gender: [birth year] (85 y.o. M) Treating RN: Donnamarie Poag Primary Care Physician: Nobie Putnam Other Clinician: Referring Physician: Nobie Putnam Treating Physician/Extender: Skipper Cliche in Treatment: 33 Education Assessment Education Provided To: Patient Education Topics Provided Wound/Skin Impairment: Electronic Signature(s) Signed: 10/07/2020 3:06:19 PM By: Donnamarie Poag Entered By: Donnamarie Poag on 10/07/2020 12:00:24 Pustejovsky, [name] ([id number]) -------------------------------------------------------------------------------- Wound Assessment Details Patient Name: [name] Date of Service: 10/07/2020 11:00 AM Medical Record Number: [id number] Patient Account Number: [id number] Date of Birth/Sex: [birth year] (85 y.o. M) Treating RN: Carlene Coria Primary Care Amadu Schlageter: Nobie Putnam Other Clinician: Referring Kamyia Thomason: Nobie Putnam Treating Nieko Clarin/Extender: Skipper Cliche in Treatment: 18 Wound Status Wound Number: 1 Primary Etiology:  Pressure Ulcer Wound Location: Right, Lateral Foot Wound Status: Open Wounding Event: Pressure Injury Comorbid History: Cataracts, Hypertension Date Acquired: 04/03/2020 Weeks Of Treatment: 18 Clustered Wound: No Wound Measurements Length: (cm) 0.5 Width: (cm) 0.5 Depth: (cm) 0.1 Area: (cm) 0.196 Volume: (cm) 0.02 % Reduction in Area: 99.1% % Reduction in Volume: 99.8% Epithelialization: Small (1-33%) Tunneling: No Undermining: No Wound Description Classification: Unstageable/Unclassified Wound Margin: Thickened Exudate Amount: Small Exudate Type: Serosanguineous Exudate Color: red, brown Foul Odor After Cleansing: No Slough/Fibrino No Wound Bed Granulation Amount: Large (67-100%) Exposed Structure Granulation Quality: Red, Hyper-granulation Fascia Exposed: No Necrotic Amount: None Present (0%) Fat Layer (Subcutaneous Tissue) Exposed: Yes Tendon Exposed: No Muscle Exposed: No Joint Exposed: No Bone Exposed: No Treatment Notes Wound #1 (Foot) Wound Laterality: Right, Lateral Cleanser Normal Saline Discharge Instruction: Wash your hands with soap and water. Remove old dressing, discard into plastic bag and place into trash. Cleanse the wound with Normal Saline prior to applying a clean dressing using gauze sponges, not tissues or cotton balls. Do not scrub or use excessive force. Pat dry using gauze sponges, not tissue or cotton balls. Peri-Wound Care Topical Primary Dressing Hydrofera Blue Ready Transfer Foam, 2.5x2.5 (in/in) Discharge Instruction: Apply Hydrofera Blue Ready to wound bed as directed Secondary Dressing ABD Pad 5x9 (in/in) Discharge Instruction: Cover with ABD pad Secured With Compression Wrap Graven, Trea D. ([id number]) Profore Lite LF 3 Multilayer Compression Bandaging System Discharge Instruction: Apply 3 multi-layer wrap as prescribed. Compression Stockings Add-Ons Electronic Signature(s) Signed: 10/09/2020 4:27:57 PM By: Carlene Coria  RN Entered By: Carlene Coria on 10/07/2020 11:29:09 Betker, [name] ([id number]) -------------------------------------------------------------------------------- Vitals Details [name] Date of Service: 10/07/2020 11:00 AM Medical Record Number: [id number] Patient  Account Number: [id number] Date of Birth/Sex: Sep 10, 1934 (86 y.o. M) Treating RN: Carlene Coria Primary Care Lucila Klecka: Nobie Putnam Other Clinician: Referring Wille Aubuchon: Nobie Putnam Treating Mercedez Boule/Extender: Skipper Cliche in Treatment: 18 Vital Signs Time Taken: 11:24 Temperature (F): 97.6 Height (in): 71 Pulse (bpm): 73 Weight (lbs): 216 Respiratory Rate (breaths/min): 18 Body Mass Index (BMI): 30.1 Blood Pressure (mmHg): 154/83 Reference Range: 80 - 120 mg / dl Electronic Signature(s) Signed: 10/09/2020 4:27:57 PM By: Carlene Coria RN Entered By: Carlene Coria on 10/07/2020 11:24:45

## 2020-10-11 DIAGNOSIS — I129 Hypertensive chronic kidney disease with stage 1 through stage 4 chronic kidney disease, or unspecified chronic kidney disease: Secondary | ICD-10-CM | POA: Diagnosis not present

## 2020-10-11 DIAGNOSIS — L89616 Pressure-induced deep tissue damage of right heel: Secondary | ICD-10-CM | POA: Diagnosis not present

## 2020-10-11 DIAGNOSIS — R300 Dysuria: Secondary | ICD-10-CM | POA: Diagnosis not present

## 2020-10-11 DIAGNOSIS — E785 Hyperlipidemia, unspecified: Secondary | ICD-10-CM | POA: Diagnosis not present

## 2020-10-11 DIAGNOSIS — I69315 Cognitive social or emotional deficit following cerebral infarction: Secondary | ICD-10-CM | POA: Diagnosis not present

## 2020-10-11 DIAGNOSIS — N183 Chronic kidney disease, stage 3 unspecified: Secondary | ICD-10-CM | POA: Diagnosis not present

## 2020-10-14 ENCOUNTER — Encounter: Payer: Medicare Other | Admitting: Physician Assistant

## 2020-10-14 ENCOUNTER — Other Ambulatory Visit: Payer: Self-pay

## 2020-10-14 DIAGNOSIS — L89890 Pressure ulcer of other site, unstageable: Secondary | ICD-10-CM | POA: Diagnosis not present

## 2020-10-14 DIAGNOSIS — L89610 Pressure ulcer of right heel, unstageable: Secondary | ICD-10-CM | POA: Diagnosis not present

## 2020-10-14 DIAGNOSIS — Z7901 Long term (current) use of anticoagulants: Secondary | ICD-10-CM | POA: Diagnosis not present

## 2020-10-14 DIAGNOSIS — Z09 Encounter for follow-up examination after completed treatment for conditions other than malignant neoplasm: Secondary | ICD-10-CM | POA: Diagnosis not present

## 2020-10-14 DIAGNOSIS — R296 Repeated falls: Secondary | ICD-10-CM | POA: Diagnosis not present

## 2020-10-14 NOTE — Progress Notes (Signed)
KANNAN, PROIA ([id number]) Visit Report for 10/14/2020 Arrival Information Details [name], [name] Date of Service: 10/14/2020 11:00 AM Medical Record Number: [id number] Patient Account Number: [id number] Date of Birth/Sex: 1934/12/10 (85 y.o. M) Treating RN: Dolan Amen Primary Care Daschel Roughton: Nobie Putnam Other Clinician: Referring Jeremie Giangrande: Nobie Putnam Treating Rithy Mandley/Extender: Skipper Cliche in Treatment: 19 Visit Information History Since Last Visit Pain Present Now: No Patient Arrived: Ambulatory Arrival Time: 11:30 Accompanied By: self Transfer Assistance: None Patient Identification Verified: Yes Secondary Verification Process Completed: Yes Patient Requires Transmission-Based Precautions: No Patient Has Alerts: Yes Patient Alerts: Not Diabetic Plavix Electronic Signature(s) Signed: 10/14/2020 4:30:50 PM By: Georges Mouse, Minus Breeding RN Entered By: Georges Mouse, Minus Breeding on 10/14/2020 11:30:31 Balon, [name] ([id number]) -------------------------------------------------------------------------------- Clinic Level of Care Assessment Details [name] Date of Service: 10/14/2020 11:00 AM Medical Record Number: [id number] Patient Account Number: [id number] Date of Birth/Sex: [birth year] (85 y.o. M) Treating RN: Donnamarie Poag Primary Care Jayceion Lisenby: Nobie Putnam Other Clinician: Referring Wave Calzada: Nobie Putnam Treating Jesus Poplin/Extender: Skipper Cliche in Treatment: 19 Clinic Level of Care Assessment Items TOOL 4 Quantity Score []  - Use when only an EandM is performed on FOLLOW-UP visit 0 ASSESSMENTS - Nursing Assessment / Reassessment []  - Reassessment of Co-morbidities (includes updates in patient status) 0 []  - 0 Reassessment of Adherence to Treatment Plan ASSESSMENTS - Wound and Skin Assessment / Reassessment X - Simple Wound Assessment / Reassessment - one wound 1 5 []  - 0 Complex Wound  Assessment / Reassessment - multiple wounds []  - 0 Dermatologic / Skin Assessment (not related to wound area) ASSESSMENTS - Focused Assessment []  - Circumferential Edema Measurements - multi extremities 0 []  - 0 Nutritional Assessment / Counseling / Intervention []  - 0 Lower Extremity Assessment (monofilament, tuning fork, pulses) []  - 0 Peripheral Arterial Disease Assessment (using hand held doppler) ASSESSMENTS - Ostomy and/or Continence Assessment and Care []  - Incontinence Assessment and Management 0 []  - 0 Ostomy Care Assessment and Management (repouching, etc.) PROCESS - Coordination of Care X - Simple Patient / Family Education for ongoing care 1 15 []  - 0 Complex (extensive) Patient / Family Education for ongoing care []  - 0 Staff obtains Programmer, systems, Records, Test Results / Process Orders []  - 0 Staff telephones HHA, Nursing Homes / Clarify orders / etc []  - 0 Routine Transfer to another Facility (non-emergent condition) []  - 0 Routine Hospital Admission (non-emergent condition) []  - 0 New Admissions / Biomedical engineer / Ordering NPWT, Apligraf, etc. []  - 0 Emergency Hospital Admission (emergent condition) X- 1 10 Simple Discharge Coordination []  - 0 Complex (extensive) Discharge Coordination PROCESS - Special Needs []  - Pediatric / Minor Patient Management 0 []  - 0 Isolation Patient Management []  - 0 Hearing / Language / Visual special needs []  - 0 Assessment of Community assistance (transportation, Nguyen/C planning, etc.) []  - 0 Additional assistance / Altered mentation []  - 0 Support Surface(s) Assessment (bed, cushion, seat, etc.) INTERVENTIONS - Wound Cleansing / Measurement [name], [name] ([id number]) X- 1 5 Simple Wound Cleansing - one wound []  - 0 Complex Wound Cleansing - multiple wounds []  - 0 Wound Imaging (photographs - any number of wounds) []  - 0 Wound Tracing (instead of photographs) X- 1 5 Simple Wound Measurement - one wound []  -  0 Complex Wound Measurement - multiple wounds INTERVENTIONS - Wound Dressings X - Small Wound Dressing one or multiple wounds 1 10 []  - 0 Medium Wound Dressing one or multiple wounds []  -  0 Large Wound Dressing one or multiple wounds []  - 0 Application of Medications - topical []  - 0 Application of Medications - injection INTERVENTIONS - Miscellaneous []  - External ear exam 0 []  - 0 Specimen Collection (cultures, biopsies, blood, body fluids, etc.) []  - 0 Specimen(s) / Culture(s) sent or taken to Lab for analysis []  - 0 Patient Transfer (multiple staff / Civil Service fast streamer / Similar devices) []  - 0 Simple Staple / Suture removal (25 or less) []  - 0 Complex Staple / Suture removal (26 or more) []  - 0 Hypo / Hyperglycemic Management (close monitor of Blood Glucose) []  - 0 Ankle / Brachial Index (ABI) - do not check if billed separately X- 1 5 Vital Signs Has the patient been seen at the hospital within the last three years: Yes Total Score: 55 Level Of Care: New/Established - Level 2 Electronic Signature(s) Signed: 10/14/2020 4:19:43 PM By: Donnamarie Poag Entered By: Donnamarie Poag on 10/14/2020 12:09:17 [name], [name] ([id number]) -------------------------------------------------------------------------------- Encounter Discharge Information Details Patient Name: Jonathan Moros Nguyen. Date of Service: 10/14/2020 11:00 AM Medical Record Number: [id number] Patient Account Number: [id number] Date of Birth/Sex: 1934-07-20 (85 y.o. M) Treating RN: Donnamarie Poag Primary Care Dezire Turk: Nobie Putnam Other Clinician: Referring Renella Steig: Nobie Putnam Treating Emanual Lamountain/Extender: Skipper Cliche in Treatment: 32 Encounter Discharge Information Items Discharge Condition: Stable Ambulatory Status: Ambulatory Discharge Destination: Home Transportation: Private Auto Accompanied By: self Schedule Follow-up Appointment: Yes Clinical Summary of Care: Electronic Signature(s) Signed:  10/14/2020 4:30:50 PM By: Donnamarie Poag Previous Signature: 10/14/2020 4:19:43 PM Version By: Donnamarie Poag Entered By: Donnamarie Poag on 10/14/2020 16:30:50 Knickerbocker, Kashus Keturah Barre ([id number]) -------------------------------------------------------------------------------- Lower Extremity Assessment Details [name] Date of Service: 10/14/2020 11:00 AM Medical Record Number: [id number] Patient Account Number: [id number] Date of Birth/Sex: Sep 21, 1934 (85 y.o. M) Treating RN: Dolan Amen Primary Care Nalea Salce: Nobie Putnam Other Clinician: Referring Marcele Kosta: Nobie Putnam Treating Jahkeem Kurka/Extender: Skipper Cliche in Treatment: 19 Edema Assessment Assessed: [Left: No] Patrice Paradise: Yes] Edema: [Left: N] [Right: o] Calf Left: Right: Point of Measurement: 36 cm From Medial Instep 34.2 cm Ankle Left: Right: Point of Measurement: 12 cm From Medial Instep 24 cm Vascular Assessment Pulses: Dorsalis Pedis Palpable: [Right:Yes] Electronic Signature(s) Signed: 10/14/2020 4:30:50 PM By: Georges Mouse, Minus Breeding RN Entered By: Georges Mouse, Minus Breeding on 10/14/2020 11:38:25 [name], [name] ([id number]) -------------------------------------------------------------------------------- Multi Wound Chart Details [name] Date of Service: 10/14/2020 11:00 AM Medical Record Number: [id number] Patient Account Number: [id number] Date of Birth/Sex: 12-Jan-1935 (85 y.o. M) Treating RN: Donnamarie Poag Primary Care Gearl Kimbrough: Nobie Putnam Other Clinician: Referring Jaivion Kingsley: Nobie Putnam Treating Jaydeen Odor/Extender: Skipper Cliche in Treatment: 19 Vital Signs Height(in): 71 Pulse(bpm): 23 Weight(lbs): 216 Blood Pressure(mmHg): 139/70 Body Mass Index(BMI): 30 Temperature(F): 98.3 Respiratory Rate(breaths/min): 18 Photos: [N/A:N/A] Wound Location: Right, Lateral Foot N/A N/A Wounding Event: Pressure Injury N/A N/A Primary Etiology: Pressure  Ulcer N/A N/A Comorbid History: Cataracts, Hypertension N/A N/A Date Acquired: 04/03/2020 N/A N/A Weeks of Treatment: 19 N/A N/A Wound Status: Open N/A N/A Measurements L x W x Nguyen (cm) 0.1x0.1x0.1 N/A N/A Area (cm) : 0.008 N/A N/A Volume (cm) : 0.001 N/A N/A % Reduction in Area: 100.00% N/A N/A % Reduction in Volume: 100.00% N/A N/A Classification: Unstageable/Unclassified N/A N/A Exudate Amount: None Present N/A N/A Wound Margin: Thickened N/A N/A Granulation Amount: None Present (0%) N/A N/A Necrotic Amount: None Present (0%) N/A N/A Exposed Structures: Fascia: No N/A N/A Fat Layer (Subcutaneous Tissue): No Tendon: No Muscle: No Joint:  No Bone: No Epithelialization: Large (67-100%) N/A N/A Treatment Notes Electronic Signature(s) Signed: 10/14/2020 4:19:43 PM By: Donnamarie Poag Entered By: Donnamarie Poag on 10/14/2020 12:03:26 [name], [name] ([id number]) -------------------------------------------------------------------------------- Dent Details [name] Date of Service: 10/14/2020 11:00 AM Medical Record Number: [id number] Patient Account Number: [id number] Date of Birth/Sex: Jun 15, 1934 (85 y.o. M) Treating RN: Donnamarie Poag Primary Care Abdallah Hern: Nobie Putnam Other Clinician: Referring Jacky Hartung: Nobie Putnam Treating Leeanne Butters/Extender: Skipper Cliche in Treatment: 48 Active Inactive Electronic Signature(s) Signed: 10/14/2020 4:19:43 PM By: Donnamarie Poag Entered By: Donnamarie Poag on 10/14/2020 12:03:16 [name], [name] Kitchen ([id number]) -------------------------------------------------------------------------------- Pain Assessment Details [name] Date of Service: 10/14/2020 11:00 AM Medical Record Number: [id number] Patient Account Number: [id number] Date of Birth/Sex: 12/29/1934 (85 y.o. M) Treating RN: Dolan Amen Primary Care Deandra Goering: Nobie Putnam Other Clinician: Referring Breann Losano:  Nobie Putnam Treating Verdis Bassette/Extender: Skipper Cliche in Treatment: 19 Active Problems Location of Pain Severity and Description of Pain Patient Has Paino No Site Locations Rate the pain. Current Pain Level: 0 Pain Management and Medication Current Pain Management: Electronic Signature(s) Signed: 10/14/2020 4:30:50 PM By: Georges Mouse, Minus Breeding RN Entered By: Georges Mouse, Kenia on 10/14/2020 11:31:04 [name], [name] ([id number]) -------------------------------------------------------------------------------- Patient/Caregiver Education Details [name] Date of Service: 10/14/2020 11:00 AM Medical Record Number: [id number] Patient Account Number: [id number] Date of Birth/Gender: 29-Oct-1934 (85 y.o. M) Treating RN: Donnamarie Poag Primary Care Physician: Nobie Putnam Other Clinician: Referring Physician: Nobie Putnam Treating Physician/Extender: Skipper Cliche in Treatment: 63 Education Assessment Education Provided To: Patient Education Topics Provided Basic Hygiene: Wound/Skin Impairment: Electronic Signature(s) Signed: 10/14/2020 4:19:43 PM By: Donnamarie Poag Entered By: Donnamarie Poag on 10/14/2020 12:04:14 [name], [name] ([id number]) -------------------------------------------------------------------------------- Wound Assessment Details [name] Date of Service: 10/14/2020 11:00 AM Medical Record Number: [id number] Patient Account Number: [id number] Date of Birth/Sex: 1935/02/15 (85 y.o. M) Treating RN: Donnamarie Poag Primary Care Callahan Peddie: Nobie Putnam Other Clinician: Referring Amarianna Abplanalp: Nobie Putnam Treating Murrel Freet/Extender: Skipper Cliche in Treatment: 19 Wound Status Wound Number: 1 Primary Etiology: Pressure Ulcer Wound Location: Right, Lateral Foot Wound Status: Healed - Epithelialized Wounding Event: Pressure Injury Comorbid History: Cataracts, Hypertension Date  Acquired: 04/03/2020 Weeks Of Treatment: 19 Clustered Wound: No Photos Wound Measurements Length: (cm) 0 % Red Width: (cm) 0 % Red Depth: (cm) 0 Epith Area: (cm) 0 Tunn Volume: (cm) 0 Unde uction in Area: 100% uction in Volume: 100% elialization: Large (67-100%) eling: No rmining: No Wound Description Classification: Unstageable/Unclassified Foul Wound Margin: Thickened Slou Exudate Amount: None Present Odor After Cleansing: No gh/Fibrino No Wound Bed Granulation Amount: None Present (0%) Exposed Structure Necrotic Amount: None Present (0%) Fascia Exposed: No Fat Layer (Subcutaneous Tissue) Exposed: No Tendon Exposed: No Muscle Exposed: No Joint Exposed: No Bone Exposed: No Treatment Notes Wound #1 (Foot) Wound Laterality: Right, Lateral Cleanser Peri-Wound Care Topical Primary Dressing [name], [name] ([id number]) Secondary Dressing Secured With Compression Wrap Compression Stockings Add-Ons Electronic Signature(s) Signed: 10/14/2020 4:19:43 PM By: Donnamarie Poag Entered By: Donnamarie Poag on 10/14/2020 12:09:47 [name], [name] ([id number]) -------------------------------------------------------------------------------- Vitals Details Patient Name: Jonathan Moros Nguyen. Date of Service: 10/14/2020 11:00 AM Medical Record Number: [id number] Patient Account Number: [id number] Date of Birth/Sex: 1934/05/22 (85 y.o. M) Treating RN: Dolan Amen Primary Care Katrenia Alkins: Nobie Putnam Other Clinician: Referring Loranzo Desha: Nobie Putnam Treating Simmone Cape/Extender: Skipper Cliche in Treatment: 19 Vital Signs Time Taken: 11:30 Temperature (F): 98.3 Height (in): 71 Pulse (bpm): 68 Weight (lbs):  216 Respiratory Rate (breaths/min): 18 Body Mass Index (BMI): 30.1 Blood Pressure (mmHg): 139/70 Reference Range: 80 - 120 mg / dl Electronic Signature(s) Signed: 10/14/2020 4:30:50 PM By: Georges Mouse, Minus Breeding RN Entered By: Georges Mouse, Minus Breeding on 10/14/2020  11:30:49

## 2020-10-14 NOTE — Progress Notes (Addendum)
LOLA, LOFARO ([id number]) Visit Report for 10/14/2020 Chief Complaint Document Details [name], [name] Date of Service: 10/14/2020 11:00 AM Medical Record Number: [id number] Patient Account Number: [id number] Date of Birth/Sex: [birth year] (86 y.o. M) Treating RN: Donnamarie Poag Primary Care Provider: Nobie Putnam Other Clinician: Referring Provider: Nobie Putnam Treating Provider/Extender: Skipper Cliche in Treatment: 62 Information Obtained from: Patient Chief Complaint Right heel ulcer Electronic Signature(s) Signed: 10/14/2020 11:49:52 AM By: Worthy Keeler PA-C Entered By: Worthy Keeler on 10/14/2020 11:49:52 [name] ([id number]) -------------------------------------------------------------------------------- HPI Details [name] Date of Service: 10/14/2020 11:00 AM Medical Record Number: [id number] Patient Account Number: [id number] Date of Birth/Sex: 1934/08/24 (86 y.o. M) Treating RN: Donnamarie Poag Primary Care Provider: Nobie Putnam Other Clinician: Referring Provider: Nobie Putnam Treating Provider/Extender: Skipper Cliche in Treatment: 19 History of Present Illness HPI Description: 06/03/2020 upon evaluation today patient actually presents for initial inspection here in our clinic after having gone to urgent care this morning where they were concerned about infection as well as a significant "softball sized wound with eschar" over the heel. With that being said the patient fortunately does not have an area that largely does have an area of eschar which is draining around the edges really not stable and I think we are getting to remove the eschar so we try to clean up the surface of the wound. Fortunately there is no evidence of active infection at this time. I do believe that he is going require some debridement both manually today with scissors and forceps as well as enzymatic/chemically with the  medications. I really feel like that he has a good chance to get this to heal if he keep pressure off of it we did discuss that again today. He does note this has been present for about 2 months. He is not taking any of his current medications that we did put him on the list he tells me that he only occasionally takes them which is obviously not doing anything for him. He does have a cane but he does not use it and his ABI was 1.14 on the right. 06/10/2020 upon evaluation today patient appears to be doing well with regard to his wound all things considered. He still has a lot of necrotic tissue in the central portion of the wound. I think the Iodosorb is still the right thing to do and again the biggest issue is he quit using it as he and his wife felt like that the drainage following putting on the medicine was really "nasty". Nonetheless I explained that the weight Iodoflex breaks down or Iodosorb and that round does make it look really bad but it actually is not as bad as what it seems. In fact it is the normal course of things. 06/24/2020 upon evaluation today patient appears to be doing unfortunately about the same in regard to his heel. I do not think this is any worse. However its become increasingly clear that he has significant dementia in my opinion. We have had multiple instances going on here from the standpoint of trying to get him to take the oral antibiotic. Initially we ordered it for him he picked it up on 1 afternoon and by the next morning we called to make sure that he had gotten it he told us yes but that he had thrown it away. That he was not to take any additional medications. We then told him he needed to be taken this  to get the infection under control he subsequently look for it only found the bottle never took the medication therefore I sent it in a second time for him. After sending this and it looks like he has not really taken it since that time he tells me that he took 7  pills at once although I do not think they were all of the same medication and then following this it made him sick I think these were all of his different medicines. He decided he was not to take any further medication after that point. Nonetheless I do believe he still is going require treatment for this infection the good news is he has a friend who is a very good friend that is with him here today to try to help with figuring out what to do and how going forward this should be taken care of. 07/01/2020 upon evaluation today patient appears to be doing really about the same. His measurements are maybe slightly smaller than previous. Fortunately there is no signs of active infection which is great news. No fevers, chills, nausea, vomiting, or diarrhea. 07/08/2020 upon evaluation today patient appears to be doing better in regard to his wounds. He looks like he finally actually took the Levaquin which has helped with the infection. I think things are doing much better in that regard which is great news. No fevers, chills, nausea, vomiting, or diarrhea. Unfortunately he did have actual Kleenex/tissue paper over the wound on really not sure why his wife put that on there but nonetheless I think were ready to start wrapping this week. 07/15/2020 on evaluation today patient appears to be doing about the same in regard to his wound. Unfortunately there does not appear to be any significant improvement. With that being said he also came in today with the heel of his wrap completely hanging out where the wound was exposed and just rubbing on the bedroom slipper that he has on today. With that being said this is obviously not ideal. I discussed that with the patient I really think that he is getting need to be more careful and conscientious about what is going on with his wrap. With that being said some of this is to know followed on his own that he actually has in my opinion significant dementia which inhibits  his ability to be able to comply. He has a friend with him today who is trying to help out as best he can I am just not certain that [name] is actually able to do what needs to be done here. 07/22/20 upon evaluation today patient appears to be doing well with regard to his wound is measuring a little bit smaller. With that being said he did not have any dressings on whatsoever upon evaluation today it was just gauze wrapped Curlex over his leg. Subsequently the Coban following this really was not the appropriate wrap and more importantly he had no dressing actually on the wound itself. Fortunately there does not appear to be any signs of active infection which is great news. Unfortunately my main concern here is that the patient needs to have appropriate dressings on in order to prevent infection and allow for continued and appropriate healing. 07/29/2020 upon evaluation today patient appears to be doing okay in regard to his wound. Unfortunately still do not have the Hydrofera Blue in place in fact it appears that he had a wet-to-dry dressing again applied to the heel even after our conversation with the home  health supervisor last week where they told us that that is what the nurse had put in place and that we have been contacted to advise [place] that that was being done. We had never been contacted in any such regard prior to that being done. At that point we advised that until they get the [facility] in they could use a silver alginate dressing. Nonetheless again today the patient has a wet-to-dry dressing only on he is very macerated around the edges he is also very hyper granulated in regard to the wound bed both a direct result of inappropriate dressings being applied to this wound. That is a direct cause of issues going on with home health not following orders as prescribed by the clinic here. 08/05/2020 upon evaluation today patient appears to be doing excellent in regard to his wound. He has  been tolerating the dressing changes without complication and overall very pleased with where things stand currently. With that being said the patient did not have the ABD pad in place and I really think he may actually be able to do better with XtraSorb even at this time. Nonetheless I think that if we can get this swelling under control and then turned the edema lessens is weeping then that will be a dramatic improvement. We can work on that currently. 08/19/2020 upon evaluation today patient appears to be doing excellent in regard to his heel ulcer. He has been tolerating the dressing changes without complication. I do feel like he is making good progress which is great news. There does not appear to be any signs of significant infection at this point at all which is also excellent. [name], [name] ([id number]) 08/26/2020 upon evaluation today patient appears to be doing excellent in regard to his heel ulcer. He is draining a lot but I think the big issue here is that he probably needs to be elevating his legs a lot more to help with controlling some of this edema. Fortunately there is no evidence of active infection at this time which is great news. No fevers, chills, nausea, vomiting, or diarrhea. 09/02/2020 upon evaluation today patient appears to be doing well with regard to his wound. He is measuring better and the wound is looking much smaller today. I am very pleased with where things stand. He still has a lot of drainage but he tells me he has been trying to elevate his legs as much as possible which I think is helping to some degree. 5/23; wound on the right lateral foot. Using Hydrofera Blue 3 layer compression. 6/1; right lateral foot. Although this looks very healthy it is only slightly improved in terms of width. He is not wearing any offloading. The wound is lateral but very well could be in the weightbearing area 09/23/2020 upon evaluation today patient appears to be doing excellent in  regard to his heel ulcer. He has been tolerating the dressing changes without complication. Fortunately there is no signs of infection at this point which is great news. No fevers, chills, nausea, vomiting, or diarrhea. 10/04/2020 upon evaluation today patient actually appears to be doing excellent in regard to his wound. There does not appear to be any signs of infection. Overall I am extremely pleased with where things stand. I think he is on the right track and getting very close to resolution. 10/07/2020 upon evaluation today patient appears to be doing well with regard to his wound on the heel. Is been tolerating the dressing changes without complication. Fortunately  there is no signs of active infection at this time which is great and overall I am extremely pleased with where things stand. No need for sharp debridement today. 10/14/2020 upon evaluation today patient appears to be doing well with regard to his wound. In fact he appears to be completely healed which is great news. There is no sign of infection and I am extremely pleased in that regard. No fevers, chills, nausea, vomiting, or diarrhea. Electronic Signature(s) Signed: 10/14/2020 2:01:07 PM By: Worthy Keeler PA-C Entered By: Worthy Keeler on 10/14/2020 14:01:07 [name], [name] ([id number]) -------------------------------------------------------------------------------- Physical Exam Details [name] Date of Service: 10/14/2020 11:00 AM Medical Record Number: [id number] Patient Account Number: [id number] Date of Birth/Sex: 1935-03-14 (86 y.o. M) Treating RN: Donnamarie Poag Primary Care Provider: Nobie Putnam Other Clinician: Referring Provider: Nobie Putnam Treating Provider/Extender: Skipper Cliche in Treatment: 45 Constitutional Well-nourished and well-hydrated in no acute distress. Respiratory normal breathing without difficulty. Psychiatric this patient is able to make decisions and  demonstrates good insight into disease process. Alert and Oriented x 3. pleasant and cooperative. Notes Patient's wound currently showed signs of good granulation and epithelization at this point. There does not appear to be any evidence of active infection which is great and overall I am extremely pleased with where things stand today. Electronic Signature(s) Signed: 10/14/2020 2:01:20 PM By: Worthy Keeler PA-C Entered By: Worthy Keeler on 10/14/2020 14:01:20 [name], [name] ([id number]) -------------------------------------------------------------------------------- Physician Orders Details [name] Date of Service: 10/14/2020 11:00 AM Medical Record Number: [id number] Patient Account Number: [id number] Date of Birth/Sex: 25-Mar-1935 (86 y.o. M) Treating RN: Donnamarie Poag Primary Care Provider: Nobie Putnam Other Clinician: Referring Provider: Nobie Putnam Treating Provider/Extender: Skipper Cliche in Treatment: 74 Verbal / Phone Orders: No Diagnosis Coding ICD-10 Coding Code Description L89.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, unstageable R29.6 Repeated falls Z79.01 Long term (current) use of anticoagulants Discharge From [facility] o Discharge from [facility] Complete - wound healed o DO YOUR BEST to sleep in the bed at night. DO NOT sleep in your recliner. Long hours of sitting in a recliner leads to swelling of the legs and/or potential wounds on your backside. Home Health o DISCONTINUE Home Health for Wound Care. Bathing/ Shower/ Hygiene o May shower with wound dressing protected with water repellent cover or cast protector. - make sure protective dressing is clean and dry after bathing to cover healed wound area Off-Loading o Turn and reposition every 2 hours o Other: - keep pressure off of your healed wound area Notes May use up the rest of the Hydrofera Blue foam under dressing for protection to healed  wound area Electronic Signature(s) Signed: 10/14/2020 4:32:34 PM By: Donnamarie Poag Signed: 10/14/2020 5:58:21 PM By: Worthy Keeler PA-C Previous Signature: 10/14/2020 4:19:43 PM Version By: Donnamarie Poag Entered By: Donnamarie Poag on 10/14/2020 16:30:02 [name], Jonathan D. ([id number]) -------------------------------------------------------------------------------- Problem List Details Patient Name: Jonathan Moros D. Date of Service: 10/14/2020 11:00 AM Medical Record Number: [id number] Patient Account Number: [id number] Date of Birth/Sex: April 27, 1934 (86 y.o. M) Treating RN: Donnamarie Poag Primary Care Provider: Nobie Putnam Other Clinician: Referring Provider: Nobie Putnam Treating Provider/Extender: Skipper Cliche in Treatment: 19 Active Problems ICD-10 Encounter Code Description Active Date MDM Diagnosis L89.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, unstageable 06/03/2020 No Yes R29.6 Repeated falls 06/03/2020 No Yes Z79.01 Long term (current) use of anticoagulants 06/03/2020 No Yes Inactive Problems Resolved Problems Electronic Signature(s) Signed: 10/14/2020  11:49:47 AM By: Worthy Keeler PA-C Entered By: Worthy Keeler on 10/14/2020 11:49:47 [name], [name] ([id number]) -------------------------------------------------------------------------------- Progress Note Details [name] Date of Service: 10/14/2020 11:00 AM Medical Record Number: [id number] Patient Account Number: [id number] Date of Birth/Sex: [birth year] (86 y.o. M) Treating RN: Donnamarie Poag Primary Care Provider: Nobie Putnam Other Clinician: Referring Provider: Nobie Putnam Treating Provider/Extender: Skipper Cliche in Treatment: 19 Subjective Chief Complaint Information obtained from Patient Right heel ulcer History of Present Illness (HPI) 06/03/2020 upon evaluation today patient actually presents for initial inspection here in our clinic after having gone to urgent care  this morning where they were concerned about infection as well as a significant "softball sized wound with eschar" over the heel. With that being said the patient fortunately does not have an area that largely does have an area of eschar which is draining around the edges really not stable and I think we are getting to remove the eschar so we try to clean up the surface of the wound. Fortunately there is no evidence of active infection at this time. I do believe that he is going require some debridement both manually today with scissors and forceps as well as enzymatic/chemically with the medications. I really feel like that he has a good chance to get this to heal if he keep pressure off of it we did discuss that again today. He does note this has been present for about 2 months. He is not taking any of his current medications that we did put him on the list he tells me that he only occasionally takes them which is obviously not doing anything for him. He does have a cane but he does not use it and his ABI was 1.14 on the right. 06/10/2020 upon evaluation today patient appears to be doing well with regard to his wound all things considered. He still has a lot of necrotic tissue in the central portion of the wound. I think the Iodosorb is still the right thing to do and again the biggest issue is he quit using it as he and his wife felt like that the drainage following putting on the medicine was really "nasty". Nonetheless I explained that the weight Iodoflex breaks down or Iodosorb and that round does make it look really bad but it actually is not as bad as what it seems. In fact it is the normal course of things. 06/24/2020 upon evaluation today patient appears to be doing unfortunately about the same in regard to his heel. I do not think this is any worse. However its become increasingly clear that he has significant dementia in my opinion. We have had multiple instances going on here from  the standpoint of trying to get him to take the oral antibiotic. Initially we ordered it for him he picked it up on 1 afternoon and by the next morning we called to make sure that he had gotten it he told us yes but that he had thrown it away. That he was not to take any additional medications. We then told him he needed to be taken this to get the infection under control he subsequently look for it only found the bottle never took the medication therefore I sent it in a second time for him. After sending this and it looks like he has not really taken it since that time he tells me that he took 7 pills at once although I do not think they were all  of the same medication and then following this it made him sick I think these were all of his different medicines. He decided he was not to take any further medication after that point. Nonetheless I do believe he still is going require treatment for this infection the good news is he has a friend who is a very good friend that is with him here today to try to help with figuring out what to do and how going forward this should be taken care of. 07/01/2020 upon evaluation today patient appears to be doing really about the same. His measurements are maybe slightly smaller than previous. Fortunately there is no signs of active infection which is great news. No fevers, chills, nausea, vomiting, or diarrhea. 07/08/2020 upon evaluation today patient appears to be doing better in regard to his wounds. He looks like he finally actually took the Levaquin which has helped with the infection. I think things are doing much better in that regard which is great news. No fevers, chills, nausea, vomiting, or diarrhea. Unfortunately he did have actual Kleenex/tissue paper over the wound on really not sure why his wife put that on there but nonetheless I think were ready to start wrapping this week. 07/15/2020 on evaluation today patient appears to be doing about the same in  regard to his wound. Unfortunately there does not appear to be any significant improvement. With that being said he also came in today with the heel of his wrap completely hanging out where the wound was exposed and just rubbing on the bedroom slipper that he has on today. With that being said this is obviously not ideal. I discussed that with the patient I really think that he is getting need to be more careful and conscientious about what is going on with his wrap. With that being said some of this is to know followed on his own that he actually has in my opinion significant dementia which inhibits his ability to be able to comply. He has a friend with him today who is trying to help out as best he can I am just not certain that [name] is actually able to do what needs to be done here. 07/22/20 upon evaluation today patient appears to be doing well with regard to his wound is measuring a little bit smaller. With that being said he did not have any dressings on whatsoever upon evaluation today it was just gauze wrapped Curlex over his leg. Subsequently the Coban following this really was not the appropriate wrap and more importantly he had no dressing actually on the wound itself. Fortunately there does not appear to be any signs of active infection which is great news. Unfortunately my main concern here is that the patient needs to have appropriate dressings on in order to prevent infection and allow for continued and appropriate healing. 07/29/2020 upon evaluation today patient appears to be doing okay in regard to his wound. Unfortunately still do not have the Hydrofera Blue in place in fact it appears that he had a wet-to-dry dressing again applied to the heel even after our conversation with the home health supervisor last week where they told us that that is what the nurse had put in place and that we have been contacted to advise [place] that that was being done. We had never been contacted in any  such regard prior to that being done. At that point we advised that until they get the [facility] in they could use a  silver alginate dressing. Nonetheless again today the patient has a wet-to-dry dressing only on he is very macerated around the edges he is also very hyper granulated in regard to the wound bed both a direct result of inappropriate dressings being applied to this wound. That is a direct cause of issues going on with home health not following orders as prescribed by the clinic here. 08/05/2020 upon evaluation today patient appears to be doing excellent in regard to his wound. He has been tolerating the dressing changes without complication and overall very pleased with where things stand currently. With that being said the patient did not have the ABD pad in place and I really think he may actually be able to do better with XtraSorb even at this time. Nonetheless I think that if we can get this swelling [name], Jonathan D. ([id number]) under control and then turned the edema lessens is weeping then that will be a dramatic improvement. We can work on that currently. 08/19/2020 upon evaluation today patient appears to be doing excellent in regard to his heel ulcer. He has been tolerating the dressing changes without complication. I do feel like he is making good progress which is great news. There does not appear to be any signs of significant infection at this point at all which is also excellent. 08/26/2020 upon evaluation today patient appears to be doing excellent in regard to his heel ulcer. He is draining a lot but I think the big issue here is that he probably needs to be elevating his legs a lot more to help with controlling some of this edema. Fortunately there is no evidence of active infection at this time which is great news. No fevers, chills, nausea, vomiting, or diarrhea. 09/02/2020 upon evaluation today patient appears to be doing well with regard to his wound. He is measuring  better and the wound is looking much smaller today. I am very pleased with where things stand. He still has a lot of drainage but he tells me he has been trying to elevate his legs as much as possible which I think is helping to some degree. 5/23; wound on the right lateral foot. Using Hydrofera Blue 3 layer compression. 6/1; right lateral foot. Although this looks very healthy it is only slightly improved in terms of width. He is not wearing any offloading. The wound is lateral but very well could be in the weightbearing area 09/23/2020 upon evaluation today patient appears to be doing excellent in regard to his heel ulcer. He has been tolerating the dressing changes without complication. Fortunately there is no signs of infection at this point which is great news. No fevers, chills, nausea, vomiting, or diarrhea. 10/04/2020 upon evaluation today patient actually appears to be doing excellent in regard to his wound. There does not appear to be any signs of infection. Overall I am extremely pleased with where things stand. I think he is on the right track and getting very close to resolution. 10/07/2020 upon evaluation today patient appears to be doing well with regard to his wound on the heel. Is been tolerating the dressing changes without complication. Fortunately there is no signs of active infection at this time which is great and overall I am extremely pleased with where things stand. No need for sharp debridement today. 10/14/2020 upon evaluation today patient appears to be doing well with regard to his wound. In fact he appears to be completely healed which is great news. There is no sign of infection and I  am extremely pleased in that regard. No fevers, chills, nausea, vomiting, or diarrhea. Objective Constitutional Well-nourished and well-hydrated in no acute distress. Vitals Time Taken: 11:30 AM, Height: 71 in, Weight: 216 lbs, BMI: 30.1, Temperature: 98.3 F, Pulse: 68 bpm, Respiratory  Rate: 18 breaths/min, Blood Pressure: 139/70 mmHg. Respiratory normal breathing without difficulty. Psychiatric this patient is able to make decisions and demonstrates good insight into disease process. Alert and Oriented x 3. pleasant and cooperative. General Notes: Patient's wound currently showed signs of good granulation and epithelization at this point. There does not appear to be any evidence of active infection which is great and overall I am extremely pleased with where things stand today. Integumentary (Hair, Skin) Wound #1 status is Healed - Epithelialized. Original cause of wound was Pressure Injury. The date acquired was: 04/03/2020. The wound has been in treatment 19 weeks. The wound is located on the Right,Lateral Foot. The wound measures 0cm length x 0cm width x 0cm depth; 0cm^2 area and 0cm^3 volume. There is no tunneling or undermining noted. There is a none present amount of drainage noted. The wound margin is thickened. There is no granulation within the wound bed. There is no necrotic tissue within the wound bed. Assessment Active Problems ICD-10 Pressure ulcer of right heel, unstageable Repeated falls Long term (current) use of anticoagulants [name], [name] ([id number]) Plan Discharge From [facility] Services: Discharge from [facility] Complete - wound healed DO YOUR BEST to sleep in the bed at night. DO NOT sleep in your recliner. Long hours of sitting in a recliner leads to swelling of the legs and/or potential wounds on your backside. Home Health: DISCONTINUE Home Health for Wound Care. Bathing/ Shower/ Hygiene: May shower with wound dressing protected with water repellent cover or cast protector. - make sure protective dressing is clean and dry after bathing Off-Loading: Turn and reposition every 2 hours Other: - keep pressure off of your healed wound area General Notes: May use up the rest of the Hydrofera Blue foam under dressing for  protection WOUND #1: - Foot Wound Laterality: Right, Lateral Cleanser: Soap and Water ([place]) 3 x Per Week/30 Days Discharge Instructions: Gently cleanse wound with antibacterial soap, rinse and pat dry prior to dressing wounds Primary Dressing: Foam Dressing, 4x4 (in/in) ([place]) 3 x Per Week/30 Days Discharge Instructions: protective dressing too right lateral foot area x 2 weeks, then use large bandaid 1. Would recommend that we going continue with the wound care measures as before and the patient is in agreement with the plan. This includes the use of a protective dressing over the heel I do not think he is going to have to use the [facility] although if he has a lot left over he can use some of that for the padding before applying a big Band-Aid. 2. Also can recommend that he continue to monitor for any signs of worsening over the next couple of weeks. If he has any issues he should let me know as soon as possible. We will see patient back for reevaluation in 1 week here in the clinic. If anything worsens or changes patient will contact our office for additional recommendations. Electronic Signature(s) Signed: 10/14/2020 2:01:48 PM By: Worthy Keeler PA-C Entered By: Worthy Keeler on 10/14/2020 14:01:48 [name], [name] ([id number]) -------------------------------------------------------------------------------- SuperBill Details [name] Date of Service: 10/14/2020 Medical Record Number: [id number] Patient Account Number: [id number] Date of Birth/Sex: 1935-02-21 (86 y.o. M) Treating RN: Donnamarie Poag  Primary Care Provider: Nobie Putnam Other Clinician: Referring Provider: Nobie Putnam Treating Provider/Extender: Skipper Cliche in Treatment: 19 Diagnosis Coding ICD-10 Codes Code Description L89.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, unstageable R29.6 Repeated falls Z79.01 Long term (current) use of anticoagulants Facility  Procedures CPT4 Code: [id number] Description: [phone] - WOUND CARE VISIT-LEV 2 EST PT Modifier: Quantity: 1 Physician Procedures CPT4 Code: [id number] Description: [id number] - WC PHYS LEVEL 3 - EST PT Modifier: Quantity: 1 CPT4 Code: Description: ICD-10 Diagnosis Description L89.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, unstageable R29.6 Repeated falls Z79.01 Long term (current) use of anticoagulants Modifier: Quantity: Electronic Signature(s) Signed: 10/14/2020 2:02:02 PM By: Worthy Keeler PA-C Entered By: Worthy Keeler on 10/14/2020 14:02:02

## 2020-10-16 DIAGNOSIS — N183 Chronic kidney disease, stage 3 unspecified: Secondary | ICD-10-CM | POA: Diagnosis not present

## 2020-10-16 DIAGNOSIS — E785 Hyperlipidemia, unspecified: Secondary | ICD-10-CM | POA: Diagnosis not present

## 2020-10-16 DIAGNOSIS — I129 Hypertensive chronic kidney disease with stage 1 through stage 4 chronic kidney disease, or unspecified chronic kidney disease: Secondary | ICD-10-CM | POA: Diagnosis not present

## 2020-10-16 DIAGNOSIS — L89616 Pressure-induced deep tissue damage of right heel: Secondary | ICD-10-CM | POA: Diagnosis not present

## 2020-10-16 DIAGNOSIS — I69315 Cognitive social or emotional deficit following cerebral infarction: Secondary | ICD-10-CM | POA: Diagnosis not present

## 2020-10-16 DIAGNOSIS — R300 Dysuria: Secondary | ICD-10-CM | POA: Diagnosis not present

## 2020-10-17 ENCOUNTER — Telehealth: Payer: Self-pay | Admitting: Family Medicine

## 2020-10-17 ENCOUNTER — Ambulatory Visit: Payer: Self-pay

## 2020-10-17 ENCOUNTER — Telehealth: Payer: Self-pay | Admitting: General Practice

## 2020-10-17 DIAGNOSIS — R413 Other amnesia: Secondary | ICD-10-CM

## 2020-10-17 DIAGNOSIS — E782 Mixed hyperlipidemia: Secondary | ICD-10-CM

## 2020-10-17 DIAGNOSIS — L97513 Non-pressure chronic ulcer of other part of right foot with necrosis of muscle: Secondary | ICD-10-CM

## 2020-10-17 DIAGNOSIS — I10 Essential (primary) hypertension: Secondary | ICD-10-CM

## 2020-10-17 DIAGNOSIS — L89612 Pressure ulcer of right heel, stage 2: Secondary | ICD-10-CM | POA: Diagnosis not present

## 2020-10-17 NOTE — Patient Instructions (Signed)
Visit Information  PATIENT GOALS:  Goals Addressed             This Visit's Progress    RNCM: Manage My Medicine       Timeframe:  Short-Term Goal Priority:  High Start Date:                             Expected End Date:                       Follow Up Date 01-16-2021   - call for medicine refill 2 or 3 days before it runs out - call if I am sick and can't take my medicine - keep a list of all the medicines I take; vitamins and herbals too - learn to read medicine labels - use a pillbox to sort medicine - use an alarm clock or phone to remind me to take my medicine    Why is this important?   These steps will help you keep on track with your medicines.   Notes: [name] is interested in getting the patient on pill packaging system to help him remember to take. Has an appointment next week with pharm D. 08-08-2020: The patient is compliant with his medications at this time. His friend [name] is helping him with management of his medications. 10-17-2020: Patient is compliant with all his medications at this time due to the use of a medication pill box and assistance from friend [name].  Patient also has a home care nurse who does home visits to monitor his medication compliance.         Patient's friend's [name] verbalized understanding of patient's plan of care  Telephone follow up appointment with care management team member scheduled for:01-16-2021 at 1:45 pm  Salvatore Marvel RN, [facility] Mobile: [phone]

## 2020-10-17 NOTE — Telephone Encounter (Signed)
Copied from [place] [id number]. Topic: Medicare AWV >> Oct 17, 2020 10:47 AM Cher Nakai R wrote: Reason for CRM:  No answer -unable to leave a  message for patient to call back and schedule the Medicare Annual Wellness Visit (AWV) virtually or by telephone.  Last AWV 10/18/2018  Please schedule at anytime with [facility].  40 minute appointment  Any questions, please call me at [phone]

## 2020-10-17 NOTE — Chronic Care Management (AMB) (Signed)
Chronic Care Management   CCM RN Visit Note  10/17/2020 Name: [name] MRN: [id number] DOB: Nov 19, 1934  Subjective: [name] is a 86 y.o. year old male who is a primary care patient of [name], DO. The care management team was consulted for assistance with disease management and care coordination needs.    Engaged with patient by telephone for follow up visit in response to provider referral for case management and/or care coordination services.   Consent to Services:  The patient was given information about Chronic Care Management services, agreed to services, and gave verbal consent prior to initiation of services.  Please see initial visit note for detailed documentation.   Patient agreed to services and verbal consent obtained.   Assessment: Review of patient past medical history, allergies, medications, health status, including review of consultants reports, laboratory and other test data, was performed as part of comprehensive evaluation and provision of chronic care management services.   SDOH (Social Determinants of Health) assessments and interventions performed:    CCM Care Plan  No Known Allergies  Outpatient Encounter Medications as of 10/17/2020  Medication Sig   aspirin EC 81 MG tablet Take 1 tablet (81 mg total) by mouth in the morning. (blood thinner)   atorvastatin (LIPITOR) 40 MG tablet Take 1 tablet (40 mg total) by mouth at bedtime. (Cholesterol)   clopidogrel (PLAVIX) 75 MG tablet Take 1 tablet (75 mg total) by mouth in the morning. (blood thinner)   finasteride (PROSCAR) 5 MG tablet Take 1 tablet (5 mg total) by mouth daily.   lisinopril (ZESTRIL) 20 MG tablet Take 1 tablet (20 mg total) by mouth in the morning. (Blood pressure)   terazosin (HYTRIN) 5 MG capsule Take 1 capsule (5 mg total) by mouth at bedtime. (blood pressure, prostate)   No facility-administered encounter medications on file as of 10/17/2020.    Patient Active Problem  List   Diagnosis Date Noted   Cognitive deficit as late effect of cerebrovascular accident (CVA) 06/25/2020   Essential hypertension 06/25/2020   Imbalance 02/01/2018   Humeral head fracture, left, closed, initial encounter 09/22/2017   History of cerebrovascular accident (CVA) with residual deficit 09/09/2017   CKD (chronic kidney disease), stage III ([place]) 09/01/2017   Elevated PSA, between 10 and less than 20 ng/ml 07/22/2016   Bilateral lower extremity edema 07/22/2016   Abnormal glucose 07/15/2016   BPH with obstruction/lower urinary tract symptoms 04/10/2016   Benign hypertension with CKD (chronic kidney disease) stage III ([place]) 04/10/2016   Memory loss 04/10/2016   Gastric outlet obstruction 12/02/2014   Generalized abdominal pain    Benign fibroma of prostate 01/02/2011   HLD (hyperlipidemia) 01/02/2011   Obesity (BMI 30.0-34.9) 01/02/2011    Conditions to be addressed/monitored:HTN, HLD, and Memory Loss and Right foot wound  Care Plan : RNCM: Memory Loss  Updates made by Inge Rise, RN since 10/17/2020 12:00 AM     Problem: RNCM: Memory Loss   Priority: High     Long-Range Goal: RNCM: Memory Loss   This Visit's Progress: On track  Priority: High  Note:   Current Barriers:  Knowledge Deficits related to resources to assist with patient with changes in memory and being forgetful to take medications and care for self  Chronic Disease Management support and education needs related to memory loss and changes  Lacks caregiver support.  Non-adherence to scheduled provider appointments Non-adherence to prescribed medication regimen Unable to self administer medications as prescribed Does not  attend all scheduled provider appointments Does not adhere to prescribed medication regimen Lacks social connections Unable to perform IADLs independently Does not maintain contact with provider office Does not contact provider office for questions/concerns  Nurse Case  Manager Clinical Goal(s):  patient will work with CCM team and pcp  to address needs related to memory loss and changes in chronic conditions  patient will attend all scheduled medical appointments: 09-30-2020 patient will demonstrate improved adherence to prescribed treatment plan for wound care and other chronic conditions  as evidenced bykeeping appointments, taking medications as prescribed, and working with the CCM team to optimize health and well being  patient will work with CM team pharmacist to assist with medication needs and pill packaging.  patient will work with CM clinical social worker to help with resources for help in the home and changes in memory  Interventions:  1:1 collaboration with Parks Ranger, Devonne Doughty, DO regarding development and update of comprehensive plan of care as evidenced by provider attestation and co-signature Inter-disciplinary care team collaboration (see longitudinal plan of care) Evaluation of current treatment plan related to memory loss  and patient's adherence to plan as established by provider. 08-08-2020: Spoke to patients friend [name]. He has been going to appointments with the patient and helping him manage his medications and care. [name] states he feels the patient is doing much better and improving. [name] and his wife are going to take the patient and his wife on a vacation for a few days. 10-17-2020: Spoke with [place] approved friend of patient, [name], to obtain information today.  [name] reports patient is "doing much, much better"; "seems like he is back to his old self"; "he is a different person now", since patient is compliant with his medications. One noticeable thing regarding patient's memory loss is that patient sometimes misplaces his phone. Advised patient to call the office for changes or questions  Provided education to patient re: CCM team and the role of the CCM team in helping the patient meet needs Reviewed medications with  patient and discussed compliance, the friend assisting the patient would like to get the pill packs for the patient. Pharmacist scheduled to call next week. 08-08-2020: The patient is compliant with his medications at this time. 10-17-2020: Patient is compliant with his medication at this time due to the use of a medication pill box and the assistance of his friend [name]. Collaborated with CCM team and pcp  regarding effective management of memory changes and support to the patient  Reviewed scheduled/upcoming provider appointments including: 12-11-2020 & 01-30-2021  Social Work referral for memory changes and support Pharmacy referral for medications management and pill packaging system  Discussed plans with patient for ongoing care management follow up and provided patient with direct contact information for care management team  Patient Goals/Self-Care Activities Over the next 120 days, patient will:  - Patient will self administer medications as prescribed Patient will attend all scheduled provider appointments Patient will call pharmacy for medication refills Patient will attend church or other social activities Patient will continue to perform ADL's independently Patient will continue to perform IADL's independently Patient will call provider office for new concerns or questions Patient will work with BSW to address care coordination needs and will continue to work with the clinical team to address health care and disease management related needs.    - action plan for worsening symptoms mutually developed - community resource information provided - medication list reviewed - medication side effects managed - pain  assessed - pain management plan developed - response to pharmacologic therapy monitored - symptom review completed Follow Up Plan: Telephone follow up appointment with care management team member scheduled for: 01-16-2021 at 1:45 pm        Care Plan : RNCM: Hypertension  (Adult)  Updates made by Inge Rise, RN since 10/17/2020 12:00 AM     Problem: RNCM: Hypertension (Hypertension)   Priority: Medium     Long-Range Goal: RNCM: Hypertension Monitored   This Visit's Progress: On track  Priority: Medium  Note:   Objective:  Last practice recorded BP readings:  BP Readings from Last 3 Encounters:  09/30/20 (!) 115/54  08/06/20 (!) 162/76  06/26/20 (!) 160/74     Most recent eGFR/CrCl: No results found for: EGFR  No components found for: CRCL Current Barriers:  Knowledge Deficits related to basic understanding of hypertension pathophysiology and self care management Knowledge Deficits related to understanding of medications prescribed for management of hypertension Non-adherence to prescribed medication regimen Non-adherence to scheduled provider appointments Limited Social Support Unable to independently manage HTN Unable to self administer medications as prescribed Does not attend all scheduled provider appointments Does not adhere to prescribed medication regimen Lacks social connections Unable to perform IADLs independently Does not maintain contact with provider office Does not contact provider office for questions/concerns Case Manager Clinical Goal(s):  Over the next 120 days, patient will verbalize understanding of plan for hypertension management Over the next 120 days, patient will attend all scheduled medical appointments: 12-11-2020 & 01-30-2021 Over the next 120 days, patient will demonstrate improved adherence to prescribed treatment plan for hypertension as evidenced by taking all medications as prescribed, monitoring and recording blood pressure as directed, adhering to low sodium/DASH diet Over the next 120 days, patient will demonstrate improved health management independence as evidenced by checking blood pressure as directed and notifying PCP if SBP>160 or DBP > 90, taking all medications as prescribe, and adhering to a  low sodium diet as discussed. Over the next 120 days, patient will verbalize basic understanding of hypertension disease process and self health management plan as evidenced by compliance with medications, compliance with dietary habits- heart healthy/ADA Diet, and working with the CCM team to meet health and wellness goals  Interventions:  Collaboration with Parks Ranger, Devonne Doughty, DO regarding development and update of comprehensive plan of care as evidenced by provider attestation and co-signature Inter-disciplinary care team collaboration (see longitudinal plan of care) Evaluation of current treatment plan related to hypertension self management and patient's adherence to plan as established by provider. 08-08-2020: The patient has upcoming test for elevated psa level. Saw urologist and wound provider this week. [name] state he is doing well with his medications. No BP readings provided. 10-17-2020: Micheal states patient's blood pressure is doing good since home care nurse checks it and reports the blood pressure is good.  Patient is compliant with all medications at this time due to the use of a medication pill box and assistance from friend [name]. Provided education to patient re: stroke prevention, s/s of heart attack and stroke, DASH diet, complications of uncontrolled blood pressure Reviewed medications with patient and discussed importance of compliance Discussed plans with patient for ongoing care management follow up and provided patient with direct contact information for care management team Advised patient, providing education and rationale, to monitor blood pressure daily and record, calling PCP for findings outside established parameters.  Reviewed scheduled/upcoming provider appointments including: 12-11-2020 & 01-30-2021 Patient Goals: - blood pressure trends  reviewed - depression screen reviewed - home or ambulatory blood pressure monitoring encouraged Self-Care Activities: -  Self administers medications as prescribed Attends all scheduled provider appointments Calls provider office for new concerns, questions, or BP outside discussed parameters Checks BP and records as discussed Follows a low sodium diet/DASH diet Follow Up Plan: Telephone follow up appointment with care management team member scheduled for: 01-16-2021 at 1:45 pm    Care Plan : RNCM: HLD Management  Updates made by Inge Rise, RN since 10/17/2020 12:00 AM     Problem: RNCM: Management of HLD   Priority: Medium     Long-Range Goal: RNCM: Management of HLD   This Visit's Progress: On track  Priority: Medium  Note:   Current Barriers:  Poorly controlled hyperlipidemia, complicated by Memory changes and non-compliance with medications  Current antihyperlipidemic regimen: Atorvastatin 40 mg QD  Most recent lipid panel:  Lab Results  Component Value Date   CHOL 163 06/13/2020   HDL 37 (L) 06/13/2020   LDLCALC 106 (H) 06/13/2020   TRIG 100 06/13/2020   CHOLHDL 4.4 06/13/2020    ASCVD risk enhancing conditions: age >37,  HTN, former smoker Unable to independently manage chronic conditions due to memory impairment  Unable to self administer medications as prescribed Does not attend all scheduled provider appointments Does not adhere to prescribed medication regimen Lacks social connections Unable to perform IADLs independently Does not maintain contact with provider office Does not contact provider office for [place]):  patient will work with Consulting civil engineer, providers, and care team towards execution of optimized self-health management plan patient will verbalize understanding of plan for effective management of HLD  patient will work with [facility], CCM team and pcp  to address needs related to effective management of HLD  patient will attend all scheduled medical appointments: 12-11-2020  & 01-30-2021 Interventions: Collaboration with  [name], DO regarding development and update of comprehensive plan of care as evidenced by provider attestation and co-signature Inter-disciplinary care team collaboration (see longitudinal plan of care) Medication review performed; medication list updated in electronic medical record.  Inter-disciplinary care team collaboration (see longitudinal plan of care) Referred to pharmacy team for assistance with HLD medication management Evaluation of current treatment plan related to HLD  and patient's adherence to plan as established by provider. 08-08-2020: The patient is doing well at this time and denies any acute distress. Is compliant with medications and dietary restrictions. Doing much better with managing his chronic conditions with the help of his friend [name]. 10-17-2020: [name] reports patient is eating well balanced meals now and has a good appetite. Advised patient to call the office for changes in conditions or questions Provided education to patient re: effective management of HLD  Reviewed medications with patient and discussed compliance, the patient has not been taking medications as prescribed, his friend [name] has been helping him this week and they feel like they are back on track. Does want to talk to the pharmacist about pill packaging system.  Collaborated with CCM team and pcp  regarding changes in the patients condition and new needs the patient has.   Reviewed scheduled/upcoming provider appointments including: Reviewed with [name] all upcoming appointments the patient has and next pcp appointment on 12-11-2020 & 01-30-2021 Discussed plans with patient for ongoing care management follow up and provided patient with direct contact information for care management team Patient Goals/Self-Care Activities: - call for medicine refill 2 or 3 days before  it runs out - call if I am sick and can't take my medicine - keep a list of all the medicines I take;  vitamins and herbals too - learn to read medicine labels - use a pillbox to sort medicine - use an alarm clock or phone to remind me to take my medicine - change to whole grain breads, cereal, pasta - drink 6 to 8 glasses of water each day - eat 3 to 5 servings of fruits and vegetables each day - eat 5 or 6 small meals each day - fill half the plate with nonstarchy vegetables - limit fast food meals to no more than 1 per week - manage portion size - prepare main meal at home 3 to 5 days each week - read food labels for fat, fiber, carbohydrates and portion size - be open to making changes - I can manage, know and watch for signs of a heart attack - if I have chest pain, call for help - learn about small changes that will make a big difference - learn my personal risk factors  Follow Up Plan: Telephone follow up appointment with care management team member scheduled for: 01-16-2021 at 1:45 pm      Care Plan : RNCM: Management of wound to right heel  Updates made by Inge Rise, RN since 10/17/2020 12:00 AM     Problem: RNCM: Management of wound to right heel   Priority: High     Long-Range Goal: RNCM: Management of Right heel wound   This Visit's Progress: On track  Priority: High  Note:   Current Barriers:  Knowledge Deficits related to resources to help in the management of chronic condtions Chronic Disease Management support and education needs related to wound to right heel Lacks caregiver support.  Non-adherence to scheduled provider appointments Non-adherence to prescribed medication regimen Unable to independently manage wound to right heel  Unable to self administer medications as prescribed Does not attend all scheduled provider appointments Does not adhere to prescribed medication regimen Lacks social connections Unable to perform IADLs independently Does not maintain contact with provider office Does not contact provider office for  questions/concerns  Nurse Case Manager Clinical Goal(s):  patient will verbalize understanding of plan for effective management of wound to right heel  patient will work with [facility], CCM team, pcp and specialist  to address needs related to wound to right heel  patient will attend all scheduled medical appointments: 09-30-2020 and wound center appointments  patient will demonstrate improved adherence to prescribed treatment plan for wound to right heel as evidenced bykeeping appointments, taking medications as prescribed, healing wound, and working with the CCM team to effectively manage health and well being   Interventions:  1:1 collaboration with [name], DO regarding development and update of comprehensive plan of care as evidenced by provider attestation and co-signature Inter-disciplinary care team collaboration (see longitudinal plan of care) Evaluation of current treatment plan related to wound care to right heel  and patient's adherence to plan as established by provider. 08-08-2020: Per [name] and wound clinic notes the wound is actually healing and granulation tissue is present. The patient is managing his care with the help of his friend [name]. Will continue to follow up with the wound clinic. 10-17-2020: [name] states that patient's right heel wound is healing very well and thinks patient has been released from the wound care doctor.  The home care nurse continues to make home visits at this time but [name] states  that patient's right foot "looks good". Advised patient to call for new concerns or questions  Provided education to patient re: keeping appointments with the wound clinic and following recommendations by the specialist for effective healing of right heel wound  Collaborated with RNCM, CCM team, pcp and specialist  regarding care of right heel wound  Reviewed scheduled/upcoming provider appointments including: 12-11-2020 & 01-30-2021  Discussed plans with  patient for ongoing care management follow up and provided patient with direct contact information for care management team  Patient Goals/Self-Care Activities Over the next 120 days, patient will:  - Patient will self administer medications as prescribed Patient will attend all scheduled provider appointments Patient will call pharmacy for medication refills Patient will attend church or other social activities Patient will continue to perform ADL's independently Patient will continue to perform IADL's independently Patient will call provider office for new concerns or questions Patient will work with BSW to address care coordination needs and will continue to work with the clinical team to address health care and disease management related needs.    - barriers to meeting goals identified - change-talk evoked - choices provided - collaboration with team encouraged - decision-making supported - health risks reviewed - problem-solving facilitated - questions answered - readiness for change evaluated - reassurance provided - resources needed to meet goals identified - self-reflection promoted - self-reliance encouraged Follow Up Plan: Telephone follow up appointment with care management team member scheduled for: 01-16-2021 at 1:45 pm         Plan:Telephone follow up appointment with care management team member scheduled for:  01-16-2021 at 1:45pm  Salvatore Marvel RN, [place] Mobile: [phone]

## 2020-10-22 ENCOUNTER — Telehealth: Payer: Self-pay | Admitting: Family Medicine

## 2020-10-22 NOTE — Telephone Encounter (Signed)
Copied from [place] [id number]. Topic: Medicare AWV >> Oct 22, 2020  2:38 PM Cher Nakai R wrote: Reason for CRM: No answer unable to leave a message for patient to call back and schedule the Medicare Annual Wellness Visit (AWV) virtually or by telephone.  Last AWV 10/18/2018  Please schedule at anytime with [facility].  40 minute appointment  Any questions, please call me at [phone]

## 2020-10-23 DIAGNOSIS — N183 Chronic kidney disease, stage 3 unspecified: Secondary | ICD-10-CM | POA: Diagnosis not present

## 2020-10-23 DIAGNOSIS — L89616 Pressure-induced deep tissue damage of right heel: Secondary | ICD-10-CM | POA: Diagnosis not present

## 2020-10-23 DIAGNOSIS — I129 Hypertensive chronic kidney disease with stage 1 through stage 4 chronic kidney disease, or unspecified chronic kidney disease: Secondary | ICD-10-CM | POA: Diagnosis not present

## 2020-10-23 DIAGNOSIS — I69315 Cognitive social or emotional deficit following cerebral infarction: Secondary | ICD-10-CM | POA: Diagnosis not present

## 2020-10-23 DIAGNOSIS — E785 Hyperlipidemia, unspecified: Secondary | ICD-10-CM | POA: Diagnosis not present

## 2020-10-23 DIAGNOSIS — R300 Dysuria: Secondary | ICD-10-CM | POA: Diagnosis not present

## 2020-10-25 ENCOUNTER — Ambulatory Visit (INDEPENDENT_AMBULATORY_CARE_PROVIDER_SITE_OTHER): Payer: Medicare Other | Admitting: Pharmacist

## 2020-10-25 DIAGNOSIS — I10 Essential (primary) hypertension: Secondary | ICD-10-CM

## 2020-10-25 DIAGNOSIS — I69319 Unspecified symptoms and signs involving cognitive functions following cerebral infarction: Secondary | ICD-10-CM

## 2020-10-25 DIAGNOSIS — E782 Mixed hyperlipidemia: Secondary | ICD-10-CM | POA: Diagnosis not present

## 2020-10-25 NOTE — Chronic Care Management (AMB) (Signed)
Chronic Care Management Pharmacy Note  10/25/2020 Name:  [name] MRN:  [id number] DOB:  July 20, 1934   Subjective: [name] is an 86 y.o. year old male who is a primary patient of Jonathan Hauser, DO.  The CCM team was consulted for assistance with disease management and care coordination needs.    Engaged with patient by telephone for follow up visit in response to provider referral for pharmacy case management and/or care coordination services.   Consent to Services:  The patient was given information about Chronic Care Management services, agreed to services, and gave verbal consent prior to initiation of services.  Please see initial visit note for detailed documentation.   Patient Care Team: Jonathan Hauser, DO as PCP - General (Family Medicine) [name], RN as Case Manager ([place]) [place], Virl Nguyen, [place] as Pharmacist Jonathan Chesterfield, [name] as Social Worker (Licensed Clinical Social Worker)  Recent office visits: Office Visit with PCP on 6/13 for follow up  Recent consult visits: Office Visit with [facility] on 6/27  Hospital visits: None in previous 6 months  Objective:  Lab Results  Component Value Date   CREATININE 1.25 (H) 06/13/2020   CREATININE 0.98 08/07/2018   CREATININE 1.14 09/18/2017    Lab Results  Component Value Date   HGBA1C 5.8 (H) 06/13/2020   Last diabetic Eye exam: No results found for: HMDIABEYEEXA  Last diabetic Foot exam: No results found for: HMDIABFOOTEX      Component Value Date/Time   CHOL 163 06/13/2020 1113   TRIG 100 06/13/2020 1113   HDL 37 (L) 06/13/2020 1113   CHOLHDL 4.4 06/13/2020 1113   VLDL 21 07/20/2016 0001   LDLCALC 106 (H) 06/13/2020 1113    Clinical ASCVD: Yes  The ASCVD Risk score Jonathan Bussing DC Jr., et al., 2013) failed to calculate for the following reasons:   The 2013 ASCVD risk score is only valid for ages 41 to 64      Social History   Tobacco Use  Smoking  Status Former   Pack years: 0.00   Types: Cigarettes   Quit date: 1985   Years since quitting: 37.5  Smokeless Tobacco Former   BP Readings from Last 3 Encounters:  09/30/20 (!) 115/54  08/06/20 (!) 162/76  06/26/20 (!) 160/74   Pulse Readings from Last 3 Encounters:  09/30/20 72  08/06/20 68  06/26/20 73   Wt Readings from Last 3 Encounters:  09/30/20 214 lb (97.1 kg)  08/06/20 217 lb 6.4 oz (98.6 kg)  06/26/20 224 lb (101.6 kg)    Assessment: Review of patient past medical history, allergies, medications, health status, including review of consultants reports, laboratory and other test data, was performed as part of comprehensive evaluation and provision of chronic care management services.   SDOH:  (Social Determinants of Health) assessments and interventions performed: none   CCM Care Plan  No Known Allergies  Medications Reviewed Today     Reviewed by [name], RPH-CPP (Pharmacist) on 10/25/20 at 1016  Med List Status: <None>   Medication Order Taking? Sig Documenting Provider Last Dose Status Informant  aspirin EC 81 MG tablet [id number] Yes Take 1 tablet (81 mg total) by mouth in the morning. (blood thinner) Karamalegos, Jonathan Doughty, DO Taking Active   atorvastatin (LIPITOR) 40 MG tablet [id number] No Take 1 tablet (40 mg total) by mouth at bedtime. (Cholesterol)  Patient not taking: Reported on 10/25/2020   Jonathan Hauser, DO  Not Taking Active   clopidogrel (PLAVIX) 75 MG tablet [id number] No Take 1 tablet (75 mg total) by mouth in the morning. (blood thinner)  Patient not taking: Reported on 10/25/2020   Jonathan Hauser, DO Not Taking Active   finasteride (PROSCAR) 5 MG tablet [id number] No Take 1 tablet (5 mg total) by mouth daily.  Patient not taking: Reported on 10/25/2020   [name], PA-C Not Taking Active   lisinopril (ZESTRIL) 20 MG tablet [id number] No Take 1 tablet (20 mg total) by mouth in the morning. (Blood pressure)   Patient not taking: Reported on 10/25/2020   Jonathan Hauser, DO Not Taking Active   Multiple Vitamins-Minerals (MULTIVITAMIN ADULTS 50+ PO) [id number] Yes Take by mouth. [provider] Taking Active   terazosin (HYTRIN) 5 MG capsule [id number] No Take 1 capsule (5 mg total) by mouth at bedtime. (blood pressure, prostate)  Patient not taking: Reported on 10/25/2020   Jonathan Hauser, DO Not Taking Active             Patient Active Problem List   Diagnosis Date Noted   Cognitive deficit as late effect of cerebrovascular accident (CVA) 06/25/2020   Essential hypertension 06/25/2020   Imbalance 02/01/2018   Humeral head fracture, left, closed, initial encounter 09/22/2017   History of cerebrovascular accident (CVA) with residual deficit 09/09/2017   CKD (chronic kidney disease), stage III ([place]) 09/01/2017   Elevated PSA, between 10 and less than 20 ng/ml 07/22/2016   Bilateral lower extremity edema 07/22/2016   Abnormal glucose 07/15/2016   BPH with obstruction/lower urinary tract symptoms 04/10/2016   Benign hypertension with CKD (chronic kidney disease) stage III ([place]) 04/10/2016   Memory loss 04/10/2016   Gastric outlet obstruction 12/02/2014   Generalized abdominal pain    Benign fibroma of prostate 01/02/2011   HLD (hyperlipidemia) 01/02/2011   Obesity (BMI 30.0-34.9) 01/02/2011    Immunization History  Administered Date(s) Administered   Fluad Quad(high Dose 65+) 12/14/2018   Influenza, Seasonal, Injecte, Preservative Fre 02/07/2009   Influenza-Unspecified 02/02/2002, 02/04/2004, 01/19/2007, 01/06/2008, 01/18/2010, 12/20/2010, 12/20/2014   Pneumococcal Polysaccharide-23 02/27/2002, 08/31/2017   Td 02/27/2002   Tdap 09/08/2017    Conditions to be addressed/monitored: HTN and HLD  Care Plan : PharmD - Med Mgmt  Updates made by [name], RPH-CPP since 10/25/2020 12:00 AM     Problem: Disease Progression      Long-Range Goal:  Disease Progression Prevented or Minimized   Start Date: 07/03/2020  Expected End Date: 10/01/2020  This Visit's Progress: On track  Recent Progress: On track  Priority: High  Note:   Current Barriers:  Unable to self-administer medications as prescribed Chronic Disease Management support and education needs related to memory loss and changes Lack of blood pressure readings for clinical team  Pharmacist Clinical Goal(s):  Over the next 90 days, patient will achieve adherence to monitoring guidelines and medication adherence to achieve therapeutic efficacy through collaboration with PharmD and provider.   Interventions: 1:1 collaboration with Jonathan Hauser, DO regarding development and update of comprehensive plan of care as evidenced by provider attestation and co-signature Inter-disciplinary care team collaboration (see longitudinal plan of care) Perform chart review Patient seen for Office Visit with Wound Care on 6/27. Per notes from provider patient's wound appears to be completely healed Office Visit with PCP on 6/13 for follow up Speak with patient who reports he has one final visit remaining with Home Health next week for final wound check  Medication Adherence: Patient reports that he has stopped taking all of his prescription medications since ~ 2 weeks ago. Reports now only taking aspirin 81 mg daily and multivitamin daily Note patient previously taking medications from pill pack as filled by [facility] patient on importance of medication adherence. Discuss importance of secondary ASCVD risk prevention (patient with history of CVA) and blood pressure control. Patient denies interest in starting these medications again, but states that he will contact office about scheduling a follow up visit with PCP in the next week. Patient denies monitoring home BP Patient denies further needs from [facility] Pharmacist/declines further outreach. Will send message to PCP/CCM  team to provide update   Patient Goals/Self-Care Activities Over the next 90 days, patient will:  - focus on medication adherence by using weekly pill pack and with assistance from friend, [name]  Follow Up Plan:       Patient's preferred pharmacy is:  [facility] - [address], [place] [address] Phone: [phone] Fax: [phone]  [facility] [place], [place] [address] [place] [address] Phone: [phone] Fax: [phone]  [place], [place], [place], [place] [address] Phone: [phone] Fax: [place], [place] [place] [address] Phone: [phone] Fax: [phone]  Follow Up:  Patient requests no follow-up at this time.  Plan:  1) Patient declines further outreach from [facility] Pharmacist 2) The patient has been provided with contact information for CM Pharmacist and has been advised to call with any medication related questions or concerns.    Harlow Asa, PharmD, [place], CPP Clinical Pharmacist [facility] [phone]

## 2020-10-25 NOTE — Patient Instructions (Signed)
Visit Information  PATIENT GOALS:  Goals Addressed             This Visit's Progress    Pharmacy Goals       Our goal bad cholesterol, or LDL, is less than 70 . This is why it is important to continue taking your atorvastatin.  Please check your home blood pressure, keep a log of the results and bring this with you to your medical appointments.   Harlow Asa, PharmD, [place] [phone]         The patient verbalized understanding of instructions, educational materials, and care plan provided today and declined offer to receive copy of patient instructions, educational materials, and care plan.   The patient has been provided with contact information for CM Pharmacist and has been advised to call with any medication related questions or concerns.

## 2020-10-30 DIAGNOSIS — R300 Dysuria: Secondary | ICD-10-CM | POA: Diagnosis not present

## 2020-10-30 DIAGNOSIS — L89616 Pressure-induced deep tissue damage of right heel: Secondary | ICD-10-CM | POA: Diagnosis not present

## 2020-10-30 DIAGNOSIS — E785 Hyperlipidemia, unspecified: Secondary | ICD-10-CM | POA: Diagnosis not present

## 2020-10-30 DIAGNOSIS — I69315 Cognitive social or emotional deficit following cerebral infarction: Secondary | ICD-10-CM | POA: Diagnosis not present

## 2020-10-30 DIAGNOSIS — I129 Hypertensive chronic kidney disease with stage 1 through stage 4 chronic kidney disease, or unspecified chronic kidney disease: Secondary | ICD-10-CM | POA: Diagnosis not present

## 2020-10-30 DIAGNOSIS — N183 Chronic kidney disease, stage 3 unspecified: Secondary | ICD-10-CM | POA: Diagnosis not present

## 2020-11-06 DIAGNOSIS — R300 Dysuria: Secondary | ICD-10-CM | POA: Diagnosis not present

## 2020-11-06 DIAGNOSIS — I129 Hypertensive chronic kidney disease with stage 1 through stage 4 chronic kidney disease, or unspecified chronic kidney disease: Secondary | ICD-10-CM | POA: Diagnosis not present

## 2020-11-06 DIAGNOSIS — L89616 Pressure-induced deep tissue damage of right heel: Secondary | ICD-10-CM | POA: Diagnosis not present

## 2020-11-06 DIAGNOSIS — I69315 Cognitive social or emotional deficit following cerebral infarction: Secondary | ICD-10-CM | POA: Diagnosis not present

## 2020-11-06 DIAGNOSIS — N183 Chronic kidney disease, stage 3 unspecified: Secondary | ICD-10-CM | POA: Diagnosis not present

## 2020-11-06 DIAGNOSIS — E785 Hyperlipidemia, unspecified: Secondary | ICD-10-CM | POA: Diagnosis not present

## 2020-11-19 ENCOUNTER — Ambulatory Visit (INDEPENDENT_AMBULATORY_CARE_PROVIDER_SITE_OTHER): Payer: Medicare Other | Admitting: Licensed Clinical Social Worker

## 2020-11-19 DIAGNOSIS — I129 Hypertensive chronic kidney disease with stage 1 through stage 4 chronic kidney disease, or unspecified chronic kidney disease: Secondary | ICD-10-CM

## 2020-11-19 DIAGNOSIS — N183 Chronic kidney disease, stage 3 unspecified: Secondary | ICD-10-CM

## 2020-11-19 DIAGNOSIS — R413 Other amnesia: Secondary | ICD-10-CM

## 2020-11-19 NOTE — Chronic Care Management (AMB) (Signed)
Chronic Care Management    Clinical Social Work Note  11/19/2020 Name: EINO REZA MRN: [id number] DOB: 12-Jan-1935  CLEAVELAND SEGA is a 86 y.o. year old male who is a primary care patient of [name], DO. The CCM team was consulted to assist the patient with chronic disease management and/or care coordination needs related to: Level of Care Concerns.   Engaged with patient by telephone for follow up visit in response to provider referral for social work chronic care management and care coordination services.   Consent to Services:  The patient was given information about Chronic Care Management services, agreed to services, and gave verbal consent prior to initiation of services.  Please see initial visit note for detailed documentation.   Patient agreed to services and consent obtained.   Assessment: Patient is engaged in conversation, continues to maintain positive progress with care plan goals. He continues to receive strong support from family and friends. Patient is concerned about his ability to urinate. CCM [name] informed patient of upcoming appt with Urologist, which patient was appreciative of. See Care Plan below for interventions and patient self-care actives.  Recent life changes [place] Journey: Management of health conditions  Recommendation: Patient may benefit from, and is in agreement to work with [name] to address care coordination needs and will continue to work with the clinical team to address health care and disease management related needs.   Follow up Plan: Patient would like continued follow-up from CCM [name] .  Follow up scheduled in 01/28/21. Patient will call office if needed prior to next encounter.   SDOH (Social Determinants of Health) assessments and interventions performed:    Advanced Directives Status: Not addressed in this encounter.  CCM Care Plan  No Known Allergies  Outpatient Encounter Medications as of 11/19/2020  Medication Sig   aspirin  EC 81 MG tablet Take 1 tablet (81 mg total) by mouth in the morning. (blood thinner)   atorvastatin (LIPITOR) 40 MG tablet Take 1 tablet (40 mg total) by mouth at bedtime. (Cholesterol) (Patient not taking: Reported on 10/25/2020)   clopidogrel (PLAVIX) 75 MG tablet Take 1 tablet (75 mg total) by mouth in the morning. (blood thinner) (Patient not taking: Reported on 10/25/2020)   finasteride (PROSCAR) 5 MG tablet Take 1 tablet (5 mg total) by mouth daily. (Patient not taking: Reported on 10/25/2020)   lisinopril (ZESTRIL) 20 MG tablet Take 1 tablet (20 mg total) by mouth in the morning. (Blood pressure) (Patient not taking: Reported on 10/25/2020)   Multiple Vitamins-Minerals (MULTIVITAMIN ADULTS 50+ PO) Take by mouth.   terazosin (HYTRIN) 5 MG capsule Take 1 capsule (5 mg total) by mouth at bedtime. (blood pressure, prostate) (Patient not taking: Reported on 10/25/2020)   No facility-administered encounter medications on file as of 11/19/2020.    Patient Active Problem List   Diagnosis Date Noted   Cognitive deficit as late effect of cerebrovascular accident (CVA) 06/25/2020   Essential hypertension 06/25/2020   Imbalance 02/01/2018   Humeral head fracture, left, closed, initial encounter 09/22/2017   History of cerebrovascular accident (CVA) with residual deficit 09/09/2017   CKD (chronic kidney disease), stage III ([place]) 09/01/2017   Elevated PSA, between 10 and less than 20 ng/ml 07/22/2016   Bilateral lower extremity edema 07/22/2016   Abnormal glucose 07/15/2016   BPH with obstruction/lower urinary tract symptoms 04/10/2016   Benign hypertension with CKD (chronic kidney disease) stage III ([place]) 04/10/2016   Memory loss 04/10/2016   Gastric outlet obstruction 12/02/2014  Generalized abdominal pain    Benign fibroma of prostate 01/02/2011   HLD (hyperlipidemia) 01/02/2011   Obesity (BMI 30.0-34.9) 01/02/2011    Conditions to be addressed/monitored: ; Level of care concerns and Memory  Deficits  Care Plan : General Social Work (Adult)  Updates made by Rebekah Chesterfield, [name] since 11/19/2020 12:00 AM     Problem: Quality of Life (General Plan of Care)      Long-Range Goal: Quality of Life Maintained   Start Date: 06/19/2020  This Visit's Progress: On track  Recent Progress: On track  Priority: Medium  Note:   Timeframe:  Long-Range Goal Priority:  Medium Start Date:   07/09/20                   Expected End Date: 02/17/21                   Follow Up Date- 01/28/21  Current [place]:  ADL IADL limitations Limited access to caregiver Inability to perform ADL's independently Inability to perform IADL's independently  Clinical Goals: Patient will work with CCM [name] to address needs related to management of chronic medical conditions and psychosocial stressors that negatively impacts health mental and physical health conditions  Clinical Interventions:  Assessment of needs and barriers of treatment discussed Patient reports that he is "doing much better" Patient shared that he has been managing his appointments by writing them down CCM [name] commended patient for being active in the management of health and discussed additional strategies to assist with memory changes Patient states that he has difficulty urinating properly "When I urinate, it's more like a drizzle" CCM [name] informed patient that she would notify PCP of this CCM [name] updated patient of upcoming appointments, including with Urology on 12/03/20 Patient wrote information down and shared that he has stable transportation to the appointment  Patient continues to receive strong support from family and friends CCM [name] completed SDOH screens to assess for any basic needs/safety concerns. Patient remains independent in home and reports no psychosocial needs Collaboration with [place], Devonne Doughty, DO regarding development and update of comprehensive plan of care as evidenced by provider attestation and  co-signature Inter-disciplinary care team collaboration (see longitudinal plan of care) Other interventions provided:Mindfulness or Relaxation Training, Active listening / Reflection utilized , and Emotional Supportive Provided  Patient Goals/Self-Care Activities: Over the next 90 days Attend all scheduled provider appointments Call provider office with any new concerns or questions Continue practicing gratitude thinking to promote positive mood     Christa See, MSW, [email or web address] Phone [phone] 2:19 PM

## 2020-11-19 NOTE — Patient Instructions (Signed)
Visit Information   Goals Addressed               This Visit's Progress     Patient Stated     Obtain Coping Skills to assist with Chronic Health Conditions (pt-stated)   On track     Timeframe:  Long-Range Goal Priority:  Medium Start Date:   07/09/20                   Expected End Date: 02/17/21                   Follow Up Date- 01/28/21  Patient Goals/Self-Care Activities: Over the next 90 days Attends all scheduled provider appointments Calls provider office for new concerns or questions Continue practicing gratitude thinking to promote positive mood        Patient verbalizes understanding of instructions provided today.   Telephone follow up appointment with care management team member scheduled for:01/28/21  [name], MSW, [email or web address] Phone [phone] 2:23 PM

## 2020-12-03 ENCOUNTER — Other Ambulatory Visit: Payer: Self-pay

## 2020-12-11 ENCOUNTER — Ambulatory Visit: Payer: Medicare Other | Admitting: Family Medicine

## 2020-12-19 ENCOUNTER — Ambulatory Visit: Payer: Medicare Other | Admitting: Family Medicine

## 2021-01-16 ENCOUNTER — Ambulatory Visit (INDEPENDENT_AMBULATORY_CARE_PROVIDER_SITE_OTHER): Payer: Medicare Other

## 2021-01-16 ENCOUNTER — Telehealth: Payer: Medicare Other | Admitting: General Practice

## 2021-01-16 DIAGNOSIS — N183 Chronic kidney disease, stage 3 unspecified: Secondary | ICD-10-CM

## 2021-01-16 DIAGNOSIS — E782 Mixed hyperlipidemia: Secondary | ICD-10-CM

## 2021-01-16 DIAGNOSIS — Z9181 History of falling: Secondary | ICD-10-CM

## 2021-01-16 DIAGNOSIS — L97513 Non-pressure chronic ulcer of other part of right foot with necrosis of muscle: Secondary | ICD-10-CM

## 2021-01-16 DIAGNOSIS — I10 Essential (primary) hypertension: Secondary | ICD-10-CM

## 2021-01-16 DIAGNOSIS — I69319 Unspecified symptoms and signs involving cognitive functions following cerebral infarction: Secondary | ICD-10-CM

## 2021-01-16 DIAGNOSIS — I129 Hypertensive chronic kidney disease with stage 1 through stage 4 chronic kidney disease, or unspecified chronic kidney disease: Secondary | ICD-10-CM

## 2021-01-16 DIAGNOSIS — R413 Other amnesia: Secondary | ICD-10-CM

## 2021-01-16 NOTE — Chronic Care Management (AMB) (Signed)
Chronic Care Management   CCM RN Visit Note  01/16/2021 Name: [name] MRN: [id number] DOB: 1934-10-23  Subjective: [name] is a 86 y.o. year old male who is a primary care patient of Jonathan Hauser, DO. The care management team was consulted for assistance with disease management and care coordination needs.    Engaged with patient by telephone for follow up visit in response to provider referral for case management and/or care coordination services.   Consent to Services:  The patient was given information about Chronic Care Management services, agreed to services, and gave verbal consent prior to initiation of services.  Please see initial visit note for detailed documentation.   Patient agreed to services and verbal consent obtained.   Assessment: Review of patient past medical history, allergies, medications, health status, including review of consultants reports, laboratory and other test data, was performed as part of comprehensive evaluation and provision of chronic care management services.   SDOH (Social Determinants of Health) assessments and interventions performed:  SDOH Interventions    Flowsheet Row Most Recent Value  SDOH Interventions   Food Insecurity Interventions Intervention Not Indicated  Financial Strain Interventions Intervention Not Indicated  Housing Interventions Intervention Not Indicated  Intimate Partner Violence Interventions Intervention Not Indicated  Physical Activity Interventions Intervention Not Indicated  Social Connections Interventions Intervention Not Indicated  Transportation Interventions Intervention Not Indicated        CCM Care Plan  No Known Allergies  Outpatient Encounter Medications as of 01/16/2021  Medication Sig   aspirin EC 81 MG tablet Take 1 tablet (81 mg total) by mouth in the morning. (blood thinner)   atorvastatin (LIPITOR) 40 MG tablet Take 1 tablet (40 mg total) by mouth at bedtime. (Cholesterol)  (Patient not taking: Reported on 10/25/2020)   clopidogrel (PLAVIX) 75 MG tablet Take 1 tablet (75 mg total) by mouth in the morning. (blood thinner) (Patient not taking: Reported on 10/25/2020)   finasteride (PROSCAR) 5 MG tablet Take 1 tablet (5 mg total) by mouth daily. (Patient not taking: Reported on 10/25/2020)   lisinopril (ZESTRIL) 20 MG tablet Take 1 tablet (20 mg total) by mouth in the morning. (Blood pressure) (Patient not taking: Reported on 10/25/2020)   Multiple Vitamins-Minerals (MULTIVITAMIN ADULTS 50+ PO) Take by mouth.   terazosin (HYTRIN) 5 MG capsule Take 1 capsule (5 mg total) by mouth at bedtime. (blood pressure, prostate) (Patient not taking: Reported on 10/25/2020)   No facility-administered encounter medications on file as of 01/16/2021.    Patient Active Problem List   Diagnosis Date Noted   Cognitive deficit as late effect of cerebrovascular accident (CVA) 06/25/2020   Essential hypertension 06/25/2020   Imbalance 02/01/2018   Humeral head fracture, left, closed, initial encounter 09/22/2017   History of cerebrovascular accident (CVA) with residual deficit 09/09/2017   CKD (chronic kidney disease), stage III ([place]) 09/01/2017   Elevated PSA, between 10 and less than 20 ng/ml 07/22/2016   Bilateral lower extremity edema 07/22/2016   Abnormal glucose 07/15/2016   BPH with obstruction/lower urinary tract symptoms 04/10/2016   Benign hypertension with CKD (chronic kidney disease) stage III ([place]) 04/10/2016   Memory loss 04/10/2016   Gastric outlet obstruction 12/02/2014   Generalized abdominal pain    Benign fibroma of prostate 01/02/2011   HLD (hyperlipidemia) 01/02/2011   Obesity (BMI 30.0-34.9) 01/02/2011    Conditions to be addressed/monitored:HTN, HLD, and Memory loss, wound to foot, and falls prevention and safety   Care Plan : RNCM:  Memory Loss  Updates made by Jonathan Ingles, RN since 01/16/2021 12:00 AM     Problem: RNCM: Memory Loss   Priority: High      Long-Range Goal: RNCM: Memory Loss   Start Date: 06/24/2020  Expected End Date: 10/07/2021  This Visit's Progress: Not on track  Recent Progress: On track  Priority: High  Note:   Current Barriers:  Knowledge Deficits related to resources to assist with patient with changes in memory and being forgetful to take medications and care for self  Chronic Disease Management support and education needs related to memory loss and changes  Lacks caregiver support.  Non-adherence to scheduled provider appointments Non-adherence to prescribed medication regimen Unable to self administer medications as prescribed Does not attend all scheduled provider appointments Does not adhere to prescribed medication regimen Lacks social connections Unable to perform IADLs independently Does not maintain contact with provider office Does not contact provider office for questions/concerns  Nurse Case Manager Clinical Goal(s):  patient will work with CCM team and pcp  to address needs related to memory loss and changes in chronic conditions  patient will attend all scheduled medical appointments: 01-30-2021 at 0940 am patient will demonstrate improved adherence to prescribed treatment plan for wound care and other chronic conditions  as evidenced bykeeping appointments, taking medications as prescribed, and working with the CCM team to optimize health and well being  patient will work with CM team pharmacist to assist with medication needs and pill packaging.  patient will work with CM clinical social worker to help with resources for help in the home and changes in memory  Interventions:  1:1 collaboration with [name], Jonathan Doughty, DO regarding development and update of comprehensive plan of care as evidenced by provider attestation and co-signature Inter-disciplinary care team collaboration (see longitudinal plan of care) Evaluation of current treatment plan related to memory loss  and patient's  adherence to plan as established by provider. 08-08-2020: Spoke to patients friend [name] He has been going to appointments with the patient and helping him manage his medications and care. [name] states he feels the patient is doing much better and improving. [name] and his wife are going to take the patient and his wife on a vacation for a few days. 10-17-2020: Spoke with [facility] friend of patient, [name], to obtain information today.  [name] reports patient is "doing much, much better"; "seems like he is back to his old self"; "he is a different person now", since patient is compliant with his medications. One noticeable thing regarding patient's memory loss is that patient sometimes misplaces his phone. 01-16-2021: Spoke with patient today and he says he is doing very well but he does not remember things like he use to. He states he sometimes forgets to take his medications but could not tell the [facility] the names of his medications and why he takes them. He did not go to the urologist appointment as he forgets his appointments unless he is called and reminded. Ask him about his friend [name] helping him and he states [name] took him to [place] this past week to visit family. He states he feels he is doing very well. Reviewed with the patient his upcoming appointment with pcp. Patient states that he will come if he is reminded. Education on writing appointments down in the calendar. He says he does but he still forgets. The RNCM expressed concern for memory loss but the patient states he is getting along fine to be 86.  Advised patient to call the office for changes or questions  Provided education to patient re: CCM team and the role of the CCM team in helping the patient meet needs Reviewed medications with patient and discussed compliance, the friend assisting the patient would like to get the pill packs for the patient. Pharmacist scheduled to call next week. 08-08-2020: The patient is compliant  with his medications at this time. 10-17-2020: Patient is compliant with his medication at this time due to the use of a medication pill box and the assistance of his friend [name] 01-16-2021: The patient could not tell the RNCM if he was taking his medications as prescribed. Ask about ways he could be reminded to take his medications and remember appointments and he states he does the best her can and he is fine right now.  Collaborated with CCM team and pcp  regarding effective management of memory changes and support to the patient  Reviewed scheduled/upcoming provider appointments including:  01-30-2021 at [place] am  Social Work referral for memory changes and support. 01-16-2021: The patient currently works with the [facility] referral for medications management and pill packaging system. 01-16-2021: Has worked with the [facility] in the past but declined rescheduling with the pharm D at last outreach.  Discussed plans with patient for ongoing care management follow up and provided patient with direct contact information for care management team  Patient Goals/Self-Care Activities  patient will:  - Patient will self administer medications as prescribed Patient will attend all scheduled provider appointments Patient will call pharmacy for medication refills Patient will attend church or other social activities Patient will continue to perform ADL's independently Patient will continue to perform IADL's independently Patient will call provider office for new concerns or questions Patient will work with BSW to address care coordination needs and will continue to work with the clinical team to address health care and disease management related needs.    - action plan for worsening symptoms mutually developed - community resource information provided - medication list reviewed - medication side effects managed - pain assessed - pain management plan developed - response to pharmacologic therapy  monitored - symptom review completed Follow Up Plan: Telephone follow up appointment with care management team member scheduled for: 03-06-2021 at 3:45 pm        Task: RNCM: Develop Strategies to Manage Behavior Completed 01/16/2021  Outcome: Positive  Note:   Care Management Activities:    - action plan for worsening symptoms mutually developed - community resource information provided - medication list reviewed - medication side effects managed - pain assessed - pain management plan developed - response to pharmacologic therapy monitored - symptom review completed        Care Plan : RNCM: Hypertension (Adult)  Updates made by Jonathan Ingles, RN since 01/16/2021 12:00 AM     Problem: RNCM: Hypertension (Hypertension)   Priority: Medium     Long-Range Goal: RNCM: Hypertension Monitored   Start Date: 06/24/2020  Expected End Date: 10/07/2021  This Visit's Progress: On track  Recent Progress: On track  Priority: Medium  Note:   Objective:  Last practice recorded BP readings:  BP Readings from Last 3 Encounters:  09/30/20 (!) 115/54  08/06/20 (!) 162/76  06/26/20 (!) 160/74     Most recent eGFR/CrCl: No results found for: EGFR  No components found for: CRCL Current Barriers:  Knowledge Deficits related to basic understanding of hypertension pathophysiology and self care management Knowledge Deficits related to understanding  of medications prescribed for management of hypertension Non-adherence to prescribed medication regimen Non-adherence to scheduled provider appointments Limited Social Support Unable to independently manage HTN Unable to self administer medications as prescribed Does not attend all scheduled provider appointments Does not adhere to prescribed medication regimen Lacks social connections Unable to perform IADLs independently Does not maintain contact with provider office Does not contact provider office for questions/concerns Case Manager  Clinical Goal(s):  patient will verbalize understanding of plan for hypertension management  patient will attend all scheduled medical appointments: 01-30-2021 at 0940 am  patient will demonstrate improved adherence to prescribed treatment plan for hypertension as evidenced by taking all medications as prescribed, monitoring and recording blood pressure as directed, adhering to low sodium/DASH diet  patient will demonstrate improved health management independence as evidenced by checking blood pressure as directed and notifying PCP if SBP>160 or DBP > 90, taking all medications as prescribe, and adhering to a low sodium diet as discussed.  patient will verbalize basic understanding of hypertension disease process and self health management plan as evidenced by compliance with medications, compliance with dietary habits- heart healthy/ADA Diet, and working with the CCM team to meet health and wellness goals  Interventions:  Collaboration with [name], Jonathan Doughty, DO regarding development and update of comprehensive plan of care as evidenced by provider attestation and co-signature Inter-disciplinary care team collaboration (see longitudinal plan of care) Evaluation of current treatment plan related to hypertension self management and patient's adherence to plan as established by provider. 08-08-2020: The patient has upcoming test for elevated psa level. Saw urologist and wound provider this week. [name] state he is doing well with his medications. No BP readings provided. 10-17-2020: Micheal states patient's blood pressure is doing good since home care nurse checks it and reports the blood pressure is good.  Patient is compliant with all medications at this time due to the use of a medication pill box and assistance from friend [name]01-16-2021: No blood pressure readings given today. The patient states he is feeling fine. Reminded the patient of his upcoming appointment on 01-30-2021 at 0940  am. Provided education to patient re: stroke prevention, s/s of heart attack and stroke, DASH diet, complications of uncontrolled blood pressure Reviewed medications with patient and discussed importance of compliance. 01-16-2021: Concerned that the patient is not taking his medications as prescribed  Discussed plans with patient for ongoing care management follow up and provided patient with direct contact information for care management team Advised patient, providing education and rationale, to monitor blood pressure daily and record, calling PCP for findings outside established parameters.  Reviewed scheduled/upcoming provider appointments including:01-30-2021 at 0940 am. The patient states he will come if someone reminds him. States his memory is not good.  Patient Goals: - blood pressure trends reviewed - depression screen reviewed - home or ambulatory blood pressure monitoring encouraged Self-Care Activities: - Self administers medications as prescribed Attends all scheduled provider appointments Calls provider office for new concerns, questions, or BP outside discussed parameters Checks BP and records as discussed Follows a low sodium diet/DASH diet Follow Up Plan: Telephone follow up appointment with care management team member scheduled for: 03-06-2021 at 3:45 pm    Task: RNCM: Identify and Monitor Blood Pressure Elevation Completed 01/16/2021  Outcome: Positive  Note:   Care Management Activities:    - blood pressure trends reviewed - depression screen reviewed - home or ambulatory blood pressure monitoring encouraged        Care Plan : RNCM: HLD Management  Updates made by Jonathan Ingles, RN since 01/16/2021 12:00 AM     Problem: RNCM: Management of HLD   Priority: Medium     Long-Range Goal: RNCM: Management of HLD   Start Date: 06/24/2020  Expected End Date: 07/07/2021  This Visit's Progress: On track  Recent Progress: On track  Priority: Medium  Note:   Current  Barriers:  Poorly controlled hyperlipidemia, complicated by Memory changes and non-compliance with medications  Current antihyperlipidemic regimen: Atorvastatin 40 mg QD  Most recent lipid panel:  Lab Results  Component Value Date   CHOL 163 06/13/2020   HDL 37 (L) 06/13/2020   LDLCALC 106 (H) 06/13/2020   TRIG 100 06/13/2020   CHOLHDL 4.4 06/13/2020    ASCVD risk enhancing conditions: age >46,  HTN, former smoker Unable to independently manage chronic conditions due to memory impairment  Unable to self administer medications as prescribed Does not attend all scheduled provider appointments Does not adhere to prescribed medication regimen Lacks social connections Unable to perform IADLs independently Does not maintain contact with provider office Does not contact provider office for questions/concerns [place]):  patient will work with Consulting civil engineer, providers, and care team towards execution of optimized self-health management plan patient will verbalize understanding of plan for effective management of HLD  patient will work with [facility], CCM team and pcp  to address needs related to effective management of HLD  patient will attend all scheduled medical appointments: 01-30-2021 at 0940 am Interventions: Collaboration with Jonathan Hauser, DO regarding development and update of comprehensive plan of care as evidenced by provider attestation and co-signature Inter-disciplinary care team collaboration (see longitudinal plan of care) Medication review performed; medication list updated in electronic medical record.  Inter-disciplinary care team collaboration (see longitudinal plan of care) Referred to pharmacy team for assistance with HLD medication management Evaluation of current treatment plan related to HLD  and patient's adherence to plan as established by provider. 08-08-2020: The patient is doing well at this time and denies any acute distress. Is  compliant with medications and dietary restrictions. Doing much better with managing his chronic conditions with the help of his friend [name] 10-17-2020: [name] reports patient is eating well balanced meals now and has a good appetite. [id number]: The patient denies any issues with his HLD. States he is eating well and sleeping well.  Advised patient to call the office for changes in conditions or questions Provided education to patient re: effective management of HLD  Reviewed medications with patient and discussed compliance, the patient has not been taking medications as prescribed, his friend [name] has been helping him this week and they feel like they are back on track. Does want to talk to the pharmacist about pill packaging system. 01-16-2021: There is concern that the patient is not taking medications as prescribed. Expressed concerns but the patient states that he feels fine.  Collaborated with CCM team and pcp  regarding changes in the patients condition and new needs the patient has.   Reviewed scheduled/upcoming provider appointments including: Reviewed with [name] all upcoming appointments the patient has and next pcp appointment on 01-30-2021 at 0940 am, reminded the patient of the appointment. He states he will come if he remembers or someone calls and reminds him.  Discussed plans with patient for ongoing care management follow up and provided patient with direct contact information for care management team Patient Goals/Self-Care Activities: - call for medicine refill 2 or 3 days before it  runs out - call if I am sick and can't take my medicine - keep a list of all the medicines I take; vitamins and herbals too - learn to read medicine labels - use a pillbox to sort medicine - use an alarm clock or phone to remind me to take my medicine - change to whole grain breads, cereal, pasta - drink 6 to 8 glasses of water each day - eat 3 to 5 servings of fruits and vegetables each  day - eat 5 or 6 small meals each day - fill half the plate with nonstarchy vegetables - limit fast food meals to no more than 1 per week - manage portion size - prepare main meal at home 3 to 5 days each week - read food labels for fat, fiber, carbohydrates and portion size - be open to making changes - I can manage, know and watch for signs of a heart attack - if I have chest pain, call for help - learn about small changes that will make a big difference - learn my personal risk factors  Follow Up Plan: Telephone follow up appointment with care management team member scheduled for: 03-06-2021 at 3: 45 pm      Task: RNCM: Management of HLD Completed 01/16/2021  Outcome: Positive  Note:   Care Management Activities:    - barriers to meeting goals identified - change-talk evoked - choices provided - collaboration with team encouraged - decision-making supported - difficulty of making life-long changes acknowledged - health risks reviewed - problem-solving facilitated - questions answered - readiness for change evaluated - reassurance provided - resources needed to meet goals identified - self-reflection promoted - self-reliance encouraged        Care Plan : RNCM: Management of wound to right heel  Updates made by Jonathan Ingles, RN since 01/16/2021 12:00 AM  Completed 01/16/2021   Problem: RNCM: Management of wound to right heel Resolved 01/16/2021  Priority: High     Long-Range Goal: RNCM: Management of Right heel wound Completed 01/16/2021  Recent Progress: On track  Priority: High  Note:   Current Barriers: 01-16-2021: Closing this goal. The patient states that the wound on his right foot is completely heeled.  Knowledge Deficits related to resources to help in the management of chronic condtions Chronic Disease Management support and education needs related to wound to right heel Lacks caregiver support.  Non-adherence to scheduled provider  appointments Non-adherence to prescribed medication regimen Unable to independently manage wound to right heel  Unable to self administer medications as prescribed Does not attend all scheduled provider appointments Does not adhere to prescribed medication regimen Lacks social connections Unable to perform IADLs independently Does not maintain contact with provider office Does not contact provider office for questions/concerns  Nurse Case Manager Clinical Goal(s):  patient will verbalize understanding of plan for effective management of wound to right heel  patient will work with [facility], CCM team, pcp and specialist  to address needs related to wound to right heel  patient will attend all scheduled medical appointments: 09-30-2020 and wound center appointments  patient will demonstrate improved adherence to prescribed treatment plan for wound to right heel as evidenced bykeeping appointments, taking medications as prescribed, healing wound, and working with the CCM team to effectively manage health and well being   Interventions:  1:1 collaboration with Jonathan Hauser, DO regarding development and update of comprehensive plan of care as evidenced by provider attestation and co-signature Inter-disciplinary care team collaboration (see  longitudinal plan of care) Evaluation of current treatment plan related to wound care to right heel  and patient's adherence to plan as established by provider. 08-08-2020: Per [name] and wound clinic notes the wound is actually healing and granulation tissue is present. The patient is managing his care with the help of his friend [name] Will continue to follow up with the wound clinic. 10-17-2020: [name] states that patient's right heel wound is healing very well and thinks patient has been released from the wound care doctor.  The home care nurse continues to make home visits at this time but [name] states that patient's right foot "looks good". Advised  patient to call for new concerns or questions  Provided education to patient re: keeping appointments with the wound clinic and following recommendations by the specialist for effective healing of right heel wound  Collaborated with RNCM, CCM team, pcp and specialist  regarding care of right heel wound  Reviewed scheduled/upcoming provider appointments including: 12-11-2020 & 01-30-2021  Discussed plans with patient for ongoing care management follow up and provided patient with direct contact information for care management team  Patient Goals/Self-Care Activities Over the next 120 days, patient will:  - Patient will self administer medications as prescribed Patient will attend all scheduled provider appointments Patient will call pharmacy for medication refills Patient will attend church or other social activities Patient will continue to perform ADL's independently Patient will continue to perform IADL's independently Patient will call provider office for new concerns or questions Patient will work with BSW to address care coordination needs and will continue to work with the clinical team to address health care and disease management related needs.    - barriers to meeting goals identified - change-talk evoked - choices provided - collaboration with team encouraged - decision-making supported - health risks reviewed - problem-solving facilitated - questions answered - readiness for change evaluated - reassurance provided - resources needed to meet goals identified - self-reflection promoted - self-reliance encouraged Follow Up Plan: Telephone follow up appointment with care management team member scheduled for: 01-16-2021 at 1:45 pm        Task: RNCM: Wound to Right heel Completed 01/16/2021  Outcome: Positive  Note:   Care Management Activities:    - barriers to meeting goals identified - change-talk evoked - choices provided - collaboration with team encouraged -  decision-making supported - health risks reviewed - problem-solving facilitated - questions answered - readiness for change evaluated - reassurance provided - resources needed to meet goals identified - self-reflection promoted - self-reliance encouraged      Care Plan : RNCM: Fall Risk (Adult)  Updates made by Jonathan Ingles, RN since 01/16/2021 12:00 AM     Problem: RNCM: Fall Risk   Priority: High  Onset Date: 01/16/2021     Long-Range Goal: RNCM: Absence of Fall and Fall-Related Injury   Start Date: 01/16/2021  Expected End Date: 01/16/2022  This Visit's Progress: On track  Priority: High  Note:   Current Barriers:  Knowledge Deficits related to fall precautions in patient with  Decreased adherence to prescribed treatment for fall prevention Unable to independently manage falls as evidence by stated falls x 2 since last outreach Does not attend all scheduled provider appointments Does not adhere to prescribed medication regimen Lacks social connections Does not maintain contact with provider office Does not contact provider office for questions/concerns Knowledge Deficits related to falls prevention and safety concerns Care Coordination needs related to safety concerns and fall prevention  in a patient with high risk for fall, memory loss and other chronic conditions Chronic Disease Management support and education needs related to falls in a patient with multiple chronic conditions.  Lacks caregiver support.  Non-adherence to scheduled provider appointments Non-adherence to prescribed medication regimen Clinical Goal(s):  patient will demonstrate improved adherence to prescribed treatment plan for decreasing falls as evidenced by patient reporting and review of EMR patient will verbalize using fall risk reduction strategies discussed patient will not experience additional falls patient will verbalize understanding of plan for working with the CCM team to effectively  manage health and well being and prevent falls and remain safe in his environment  patient will work with CCM team and pcp  to address needs related to effective management of high fall risk patient will demonstrate a decrease in fall exacerbations patient will attend all scheduled medical appointments: 01-30-2021 at [place] am patient will demonstrate improved adherence to prescribed treatment plan for being safe and preventing falls  patient will demonstrate improved health management independence patient will verbalize basic understanding of chronic  disease process and self health management plan patient will demonstrate understanding of rationale for each prescribed medication Interventions:  Collaboration with [name] Jonathan Doughty, DO regarding development and update of comprehensive plan of care as evidenced by provider attestation and co-signature Inter-disciplinary care team collaboration (see longitudinal plan of care) Provided written and verbal education re: Potential causes of falls and Fall prevention strategies Reviewed medications and discussed potential side effects of medications such as dizziness and frequent urination Assessed for s/s of orthostatic hypotension Assessed for falls since last encounter. Assessed patients knowledge of fall risk prevention secondary to previously provided education. Assessed working status of life alert bracelet and patient adherence Provided patient information for fall alert systems Evaluation of current treatment plan related to falls prevention and safety and patient's adherence to plan as established by provider. Advised patient to call the office for new falls or injuries  Provided education to patient re: falls prevention and safety and utilization of DME in the home  Reviewed medications with patient and discussed compliance  Collaborated with CCM team and pcp regarding concerns about safety and how to best help the patient Reviewed  scheduled/upcoming provider appointments including: 01-30-2021 at 0940 am Discussed plans with patient for ongoing care management follow up and provided patient with direct contact information for care management team Self-Care Deficits:  Unable to independently manage falls  Unable to self administer medications as prescribed Does not attend all scheduled provider appointments Does not adhere to prescribed medication regimen Lacks social connections Unable to perform IADLs independently Does not maintain contact with provider office Does not contact provider office for questions/concerns Patient Goals:  - Utilize DME (assistive device) appropriately with all ambulation- patient states that he does not use a cane or a walker - De-clutter walkways - Change positions slowly - Wear secure fitting shoes at all times with ambulation - Utilize home lighting for dim lit areas - Demonstrate self and pet awareness at all times Follow Up Plan: Telephone follow up appointment with care management team member scheduled for: 03-06-2021 at 345 pm    Task: Identify and Manage Contributors to Fall Risk Completed 01/16/2021  Outcome: Positive  Note:   Care Management Activities:    - activities of daily living skills assessed - assistive or adaptive device use encouraged - barriers to physical activity or exercise addressed - barriers to physical activity or exercise identified - barriers to safety identified -  cognition assessed - cognitive-stimulating activities promoted - counseling by pharmacist provided - fall prevention plan reviewed and updated - fear of falling, loss of independence and pain acknowledged - incontinence managed - medication list reviewed - modification of home and work environment promoted - vision and/or hearing aid use promoted    Notes:      Plan:Telephone follow up appointment with care management team member scheduled for:  03-06-2021 at 345 pm  [name] RN,  MSN, [place] Mobile: [phone]

## 2021-01-16 NOTE — Patient Instructions (Signed)
Visit Information  PATIENT GOALS:  Goals Addressed             This Visit's Progress    RNCM: Manage My Medicine       Timeframe:  Short-Term Goal Priority:  High Start Date:   06-27-2020                          Expected End Date:     01-07-2022                  Follow Up Date 03-06-2021   - call for medicine refill 2 or 3 days before it runs out - call if I am sick and can't take my medicine - keep a list of all the medicines I take; vitamins and herbals too - learn to read medicine labels - use a pillbox to sort medicine - use an alarm clock or phone to remind me to take my medicine    Why is this important?   These steps will help you keep on track with your medicines.   Notes: [name] is interested in getting the patient on pill packaging system to help him remember to take. Has an appointment next week with pharm D. 08-08-2020: The patient is compliant with his medications at this time. His friend [name] is helping him with management of his medications. 10-17-2020: Patient is compliant with all his medications at this time due to the use of a medication pill box and assistance from friend [name].  Patient also has a home care nurse who does home visits to monitor his medication compliance. 01-16-2021: The patient states he takes his medications. Could not tell me which medications he was taking. In past he has been non-compliant with medications. He says his friend [name] helps if he ask him. He admits he forgets sometimes. Ongoing concern with patients management of medications and chronic conditions. The patient has an upcoming appointment to see the pcp in October. Reminded the patient of his appointment. Will continue to monitor.      RNCM: Prevent Falls and Injury       Follow Up Date 03/06/2021    - add more outdoor lighting - always use handrails on the stairs - always wear low-heeled or flat shoes or slippers with nonskid soles - call the doctor if I am feeling  too drowsy - install bathroom grab bars - join an exercise group in my community - keep a flashlight by the bed - keep my cell phone with me always - learn how to get back up if I fall - make an emergency alert plan in case I fall - pick up clutter from the floors - use a nonslip pad with throw rugs, or remove them completely - use a cane or walker    Why is this important?   Most falls happen when it is hard for you to walk safely. Your balance may be off because of an illness. You may have pain in your knees, hip or other joints.  You may be overly tired or taking medicines that make you sleepy. You may not be able to see or hear clearly.  Falls can lead to broken bones, bruises or other injuries.  There are things you can do to help prevent falling.     01-16-2021: The patient states that he has had 2 falls since the last time he talked to the [facility]. He states he did not get  injured. He does not use a cane or a walker. States most of the time he will bend over to pick up something and continues to go over. Review of falls prevention and safety. Will continue to monitor.         The patient verbalized understanding of instructions, educational materials, and care plan provided today and declined offer to receive copy of patient instructions, educational materials, and care plan.   Telephone follow up appointment with care management team member scheduled for: 03-06-2021 at 345 pm  [name] RN, MSN, [place] Mobile: [phone]

## 2021-01-17 DIAGNOSIS — E782 Mixed hyperlipidemia: Secondary | ICD-10-CM

## 2021-01-17 DIAGNOSIS — N183 Chronic kidney disease, stage 3 unspecified: Secondary | ICD-10-CM | POA: Diagnosis not present

## 2021-01-17 DIAGNOSIS — I10 Essential (primary) hypertension: Secondary | ICD-10-CM

## 2021-01-17 DIAGNOSIS — I129 Hypertensive chronic kidney disease with stage 1 through stage 4 chronic kidney disease, or unspecified chronic kidney disease: Secondary | ICD-10-CM

## 2021-01-21 ENCOUNTER — Ambulatory Visit (INDEPENDENT_AMBULATORY_CARE_PROVIDER_SITE_OTHER): Payer: Medicare Other

## 2021-01-21 VITALS — Ht 70.5 in | Wt 215.0 lb

## 2021-01-21 DIAGNOSIS — Z0000 Encounter for general adult medical examination without abnormal findings: Secondary | ICD-10-CM | POA: Diagnosis not present

## 2021-01-21 NOTE — Patient Instructions (Signed)
[name] , Thank you for taking time to come for your Medicare Wellness Visit. I appreciate your ongoing commitment to your health goals. Please review the following plan we discussed and let me know if I can assist you in the future.   Screening recommendations/referrals: Colonoscopy: not required Recommended yearly ophthalmology/optometry visit for glaucoma screening and checkup Recommended yearly dental visit for hygiene and checkup  Vaccinations: Influenza vaccine: due Pneumococcal vaccine: completed 08/31/2017 Tdap vaccine: completed 09/08/2017, due 09/09/2027 Shingles vaccine: discussed   Covid-19:  decline  Advanced directives: Advance directive discussed with you today.   Conditions/risks identified: none  Next appointment: Follow up in one year for your annual wellness visit.   Preventive Care 10 Years and Older, Male Preventive care refers to lifestyle choices and visits with your health care provider that can promote health and wellness. What does preventive care include? A yearly physical exam. This is also called an annual well check. Dental exams once or twice a year. Routine eye exams. Ask your health care provider how often you should have your eyes checked. Personal lifestyle choices, including: Daily care of your teeth and gums. Regular physical activity. Eating a healthy diet. Avoiding tobacco and drug use. Limiting alcohol use. Practicing safe sex. Taking low doses of aspirin every day. Taking vitamin and mineral supplements as recommended by your health care provider. What happens during an annual well check? The services and screenings done by your health care provider during your annual well check will depend on your age, overall health, lifestyle risk factors, and family history of disease. Counseling  Your health care provider may ask you questions about your: Alcohol use. Tobacco use. Drug use. Emotional well-being. Home and relationship  well-being. Sexual activity. Eating habits. History of falls. Memory and ability to understand (cognition). Work and work Statistician. Screening  You may have the following tests or measurements: Height, weight, and BMI. Blood pressure. Lipid and cholesterol levels. These may be checked every 5 years, or more frequently if you are over 7 years old. Skin check. Lung cancer screening. You may have this screening every year starting at age 67 if you have a 30-pack-year history of smoking and currently smoke or have quit within the past 15 years. Fecal occult blood test (FOBT) of the stool. You may have this test every year starting at age 72. Flexible sigmoidoscopy or colonoscopy. You may have a sigmoidoscopy every 5 years or a colonoscopy every 10 years starting at age 72. Prostate cancer screening. Recommendations will vary depending on your family history and other risks. Hepatitis C blood test. Hepatitis B blood test. Sexually transmitted disease (STD) testing. Diabetes screening. This is done by checking your blood sugar (glucose) after you have not eaten for a while (fasting). You may have this done every 1-3 years. Abdominal aortic aneurysm (AAA) screening. You may need this if you are a current or former smoker. Osteoporosis. You may be screened starting at age 74 if you are at high risk. Talk with your health care provider about your test results, treatment options, and if necessary, the need for more tests. Vaccines  Your health care provider may recommend certain vaccines, such as: Influenza vaccine. This is recommended every year. Tetanus, diphtheria, and acellular pertussis (Tdap, Td) vaccine. You may need a Td booster every 10 years. Zoster vaccine. You may need this after age 33. Pneumococcal 13-valent conjugate (PCV13) vaccine. One dose is recommended after age 69. Pneumococcal polysaccharide (PPSV23) vaccine. One dose is recommended after age 23.  Talk to your health care  provider about which screenings and vaccines you need and how often you need them. This information is not intended to replace advice given to you by your health care provider. Make sure you discuss any questions you have with your health care provider. Document Released: 05/03/2015 Document Revised: 12/25/2015 Document Reviewed: 02/05/2015 Elsevier Interactive Patient Education  2017 [place] Prevention in the Home Falls can cause injuries. They can happen to people of all ages. There are many things you can do to make your home safe and to help prevent falls. What can I do on the outside of my home? Regularly fix the edges of walkways and driveways and fix any cracks. Remove anything that might make you trip as you walk through a door, such as a raised step or threshold. Trim any bushes or trees on the path to your home. Use bright outdoor lighting. Clear any walking paths of anything that might make someone trip, such as rocks or tools. Regularly check to see if handrails are loose or broken. Make sure that both sides of any steps have handrails. Any raised decks and porches should have guardrails on the edges. Have any leaves, snow, or ice cleared regularly. Use sand or salt on walking paths during winter. Clean up any spills in your garage right away. This includes oil or grease spills. What can I do in the bathroom? Use night lights. Install grab bars by the toilet and in the tub and shower. Do not use towel bars as grab bars. Use non-skid mats or decals in the tub or shower. If you need to sit down in the shower, use a plastic, non-slip stool. Keep the floor dry. Clean up any water that spills on the floor as soon as it happens. Remove soap buildup in the tub or shower regularly. Attach bath mats securely with double-sided non-slip rug tape. Do not have throw rugs and other things on the floor that can make you trip. What can I do in the bedroom? Use night lights. Make  sure that you have a light by your bed that is easy to reach. Do not use any sheets or blankets that are too big for your bed. They should not hang down onto the floor. Have a firm chair that has side arms. You can use this for support while you get dressed. Do not have throw rugs and other things on the floor that can make you trip. What can I do in the kitchen? Clean up any spills right away. Avoid walking on wet floors. Keep items that you use a lot in easy-to-reach places. If you need to reach something above you, use a strong step stool that has a grab bar. Keep electrical cords out of the way. Do not use floor polish or wax that makes floors slippery. If you must use wax, use non-skid floor wax. Do not have throw rugs and other things on the floor that can make you trip. What can I do with my stairs? Do not leave any items on the stairs. Make sure that there are handrails on both sides of the stairs and use them. Fix handrails that are broken or loose. Make sure that handrails are as long as the stairways. Check any carpeting to make sure that it is firmly attached to the stairs. Fix any carpet that is loose or worn. Avoid having throw rugs at the top or bottom of the stairs. If you do have throw  rugs, attach them to the floor with carpet tape. Make sure that you have a light switch at the top of the stairs and the bottom of the stairs. If you do not have them, ask someone to add them for you. What else can I do to help prevent falls? Wear shoes that: Do not have high heels. Have rubber bottoms. Are comfortable and fit you well. Are closed at the toe. Do not wear sandals. If you use a stepladder: Make sure that it is fully opened. Do not climb a closed stepladder. Make sure that both sides of the stepladder are locked into place. Ask someone to hold it for you, if possible. Clearly mark and make sure that you can see: Any grab bars or handrails. First and last steps. Where the  edge of each step is. Use tools that help you move around (mobility aids) if they are needed. These include: Canes. Walkers. Scooters. Crutches. Turn on the lights when you go into a dark area. Replace any light bulbs as soon as they burn out. Set up your furniture so you have a clear path. Avoid moving your furniture around. If any of your floors are uneven, fix them. If there are any pets around you, be aware of where they are. Review your medicines with your doctor. Some medicines can make you feel dizzy. This can increase your chance of falling. Ask your doctor what other things that you can do to help prevent falls. This information is not intended to replace advice given to you by your health care provider. Make sure you discuss any questions you have with your health care provider. Document Released: 01/31/2009 Document Revised: 09/12/2015 Document Reviewed: 05/11/2014 Elsevier Interactive Patient Education  2017 [employer].

## 2021-01-21 NOTE — Progress Notes (Signed)
I connected with [name] today by telephone and verified that I am speaking with the correct person using two identifiers. Location patient: home Location provider: work Persons participating in the virtual visit: [name], Glenna Durand LPN.   I discussed the limitations, risks, security and privacy concerns of performing an evaluation and management service by telephone and the availability of in person appointments. I also discussed with the patient that there may be a patient responsible charge related to this service. The patient expressed understanding and verbally consented to this telephonic visit.    Interactive audio and video telecommunications were attempted between this provider and patient, however failed, due to patient having technical difficulties OR patient did not have access to video capability.  We continued and completed visit with audio only.     Vital signs may be patient reported or missing.  Subjective:   [name] is a 86 y.o. male who presents for Medicare Annual/Subsequent preventive examination.  Review of Systems     Cardiac Risk Factors include: advanced age (>72men, >77 women);dyslipidemia;hypertension;male gender;obesity (BMI >30kg/m2)     Objective:    Today's Vitals   01/21/21 1057  Weight: 215 lb (97.5 kg)  Height: 5' 10.5" (1.791 m)   Body mass index is 30.41 kg/m.  Advanced Directives 01/21/2021 06/03/2020 05/23/2020 08/07/2018 03/28/2018 12/07/2017 09/08/2017  Does Patient Have a Medical Advance Directive? No No No No No Yes No  Would patient like information on creating a medical advance directive? - - - - - - No - Patient declined    Current Medications (verified) Outpatient Encounter Medications as of 01/21/2021  Medication Sig   aspirin EC 81 MG tablet Take 1 tablet (81 mg total) by mouth in the morning. (blood thinner)   Multiple Vitamins-Minerals (MULTIVITAMIN ADULTS 50+ PO) Take by mouth.   atorvastatin (LIPITOR) 40 MG tablet  Take 1 tablet (40 mg total) by mouth at bedtime. (Cholesterol) (Patient not taking: No sig reported)   clopidogrel (PLAVIX) 75 MG tablet Take 1 tablet (75 mg total) by mouth in the morning. (blood thinner) (Patient not taking: No sig reported)   finasteride (PROSCAR) 5 MG tablet Take 1 tablet (5 mg total) by mouth daily. (Patient not taking: No sig reported)   lisinopril (ZESTRIL) 20 MG tablet Take 1 tablet (20 mg total) by mouth in the morning. (Blood pressure) (Patient not taking: No sig reported)   terazosin (HYTRIN) 5 MG capsule Take 1 capsule (5 mg total) by mouth at bedtime. (blood pressure, prostate) (Patient not taking: No sig reported)   No facility-administered encounter medications on file as of 01/21/2021.    Allergies (verified) Patient has no known allergies.   History: Past Medical History:  Diagnosis Date   Bowel obstruction (HCC)    BPH (benign prostatic hypertrophy)    Gastric outlet obstruction    High cholesterol    Hyperchloremia    Hypertension    Pancreatitis    Past Surgical History:  Procedure Laterality Date   ESOPHAGOGASTRODUODENOSCOPY N/A 12/03/2014   Procedure: ESOPHAGOGASTRODUODENOSCOPY (EGD);  Surgeon: Hulen Luster, MD;  Location: [facility] ENDOSCOPY;  Service: Endoscopy;  Laterality: N/A;   Family History  Problem Relation Age of Onset   Heart disease Mother    Heart disease Father    Heart disease Brother    Social History   Socioeconomic History   Marital status: Married    Spouse name: Not on file   Number of children: Not on file   Years of education:  Not on file   Highest education level: Not on file  Occupational History   Occupation: retired  Tobacco Use   Smoking status: Former    Types: Cigarettes    Quit date: 1985    Years since quitting: 37.7   Smokeless tobacco: Former  Scientific laboratory technician Use: Never used  Substance and Sexual Activity   Alcohol use: No   Drug use: No   Sexual activity: Never  Other Topics Concern   Not on  file  Social History Narrative   Not on file   Social Determinants of Health   Financial Resource Strain: Low Risk    Difficulty of Paying Living Expenses: Not hard at all  Food Insecurity: No Food Insecurity   Worried About Charity fundraiser in the Last Year: Never true   Arboriculturist in the Last Year: Never true  Transportation Needs: No Transportation Needs   Lack of Transportation (Medical): No   Lack of Transportation (Non-Medical): No  Physical Activity: Sufficiently Active   Days of Exercise per Week: 6 days   Minutes of Exercise per Session: 60 min  Stress: No Stress Concern Present   Feeling of Stress : Not at all  Social Connections: Socially Integrated   Frequency of Communication with Friends and Family: More than three times a week   Frequency of Social Gatherings with Friends and Family: More than three times a week   Attends Religious Services: More than 4 times per year   Active Member of [employer] or Organizations: Yes   Attends Music therapist: More than 4 times per year   Marital Status: Married    Tobacco Counseling Counseling given: Not Answered   Clinical Intake:  Pre-visit preparation completed: Yes  Pain : No/denies pain     Nutritional Status: BMI > 30  Obese Nutritional Risks: None Diabetes: No  How often do you need to have someone help you when you read instructions, pamphlets, or other written materials from your doctor or pharmacy?: 1 - Never What is the last grade level you completed in school?: 62yrs college  Diabetic? no  Interpreter Needed?: No  Information entered by :: NAllen LPN   Activities of Daily Living In your present state of health, do you have any difficulty performing the following activities: 01/21/2021 06/25/2020  Hearing? N N  Vision? N N  Difficulty concentrating or making decisions? N Y  Walking or climbing stairs? N N  Dressing or bathing? N N  Doing errands, shopping? N Y  Conservation officer, nature and  eating ? N -  Using the Toilet? N -  In the past six months, have you accidently leaked urine? Y -  Do you have problems with loss of bowel control? Y -  Managing your Medications? N -  Managing your Finances? N -  Housekeeping or managing your Housekeeping? N -  Some recent data might be hidden    Patient Care Team: [name], DO as PCP - General (Family Medicine) [name], RN as Case Manager ([place]) Curley Spice, Virl Diamond, RPH-CPP as Pharmacist Rebekah Chesterfield, [name] as Social Worker (Licensed Clinical Social Worker)  Indicate any recent [place] you may have received from other than [facility] providers in the past year (date may be approximate).     Assessment:   This is a routine wellness examination for [place].  Hearing/Vision screen No results found.  Dietary issues and exercise activities discussed: Current Exercise Habits:  Home exercise routine, Type of exercise: calisthenics, Time (Minutes): 60, Frequency (Times/Week): 6, Weekly Exercise (Minutes/Week): 360   Goals Addressed             This Visit's Progress    Patient Stated       01/21/2021, no goals       Depression Screen PHQ 2/9 Scores 01/21/2021 09/30/2020 06/26/2019 10/18/2018 03/02/2018 02/08/2018 10/12/2017  PHQ - 2 Score 0 0 0 0 0 0 0    Fall Risk Fall Risk  01/21/2021 09/30/2020 06/25/2020 10/18/2018 03/02/2018  Falls in the past year? 0 0 1 0 0  Comment - - - - -  Number falls in past yr: - 0 0 - -  Injury with Fall? - 0 0 - -  Risk for fall due to : No Fall Risks - Impaired balance/gait - -  Follow up Falls evaluation completed;Education provided;Falls prevention discussed Falls evaluation completed Falls evaluation completed Falls evaluation completed Falls evaluation completed    FALL RISK PREVENTION PERTAINING TO THE HOME:  Any stairs in or around the home? Yes  If so, are there any without handrails? Yes  Home free of loose throw rugs in walkways, pet beds,  electrical cords, etc? Yes  Adequate lighting in your home to reduce risk of falls? Yes   ASSISTIVE DEVICES UTILIZED TO PREVENT FALLS:  Life alert? No  Use of a cane, walker or w/c? No  Grab bars in the bathroom? Yes  Shower chair or bench in shower? No  Elevated toilet seat or a handicapped toilet? No   TIMED UP AND GO:  Was the test performed? No .      Cognitive Function:     6CIT Screen 01/21/2021 10/18/2018 10/12/2017 08/31/2017  What Year? 0 points 0 points 0 points 0 points  What month? 0 points 0 points 0 points 0 points  What time? 0 points 0 points 0 points 0 points  Count back from 20 0 points 0 points 0 points 0 points  Months in reverse 2 points 0 points 0 points 0 points  Repeat phrase 4 points 0 points 2 points 2 points  Total Score 6 0 2 2    Immunizations Immunization History  Administered Date(s) Administered   Fluad Quad(high Dose 65+) 12/14/2018   Influenza, Seasonal, Injecte, Preservative Fre 02/07/2009   Influenza-Unspecified 02/02/2002, 02/04/2004, 01/19/2007, 01/06/2008, 01/18/2010, 12/20/2010, 12/20/2014   Pneumococcal Polysaccharide-23 02/27/2002, 08/31/2017   Td 02/27/2002   Tdap 09/08/2017    TDAP status: Up to date  Flu Vaccine status: Due, Education has been provided regarding the importance of this vaccine. Advised may receive this vaccine at local pharmacy or Health Dept. Aware to provide a copy of the vaccination record if obtained from local pharmacy or Health Dept. Verbalized acceptance and understanding.  Pneumococcal vaccine status: Up to date  Covid-19 vaccine status: Declined, Education has been provided regarding the importance of this vaccine but patient still declined. Advised may receive this vaccine at local pharmacy or Health Dept.or vaccine clinic. Aware to provide a copy of the vaccination record if obtained from local pharmacy or Health Dept. Verbalized acceptance and understanding.  Qualifies for Shingles Vaccine? Yes    Zostavax completed No   Shingrix Completed?: No.    Education has been provided regarding the importance of this vaccine. Patient has been advised to call insurance company to determine out of pocket expense if they have not yet received this vaccine. Advised may also receive vaccine at local pharmacy or Health  Dept. Verbalized acceptance and understanding.  Screening Tests Health Maintenance  Topic Date Due   COVID-19 Vaccine (1) Never done   Zoster Vaccines- Shingrix (1 of 2) Never done   INFLUENZA VACCINE  11/18/2020   TETANUS/TDAP  09/09/2027   HPV VACCINES  Aged Out    Health Maintenance  Health Maintenance Due  Topic Date Due   COVID-19 Vaccine (1) Never done   Zoster Vaccines- Shingrix (1 of 2) Never done   INFLUENZA VACCINE  11/18/2020    Colorectal cancer screening: No longer required.   Lung Cancer Screening: (Low Dose CT Chest recommended if Age 52-80 years, 30 pack-year currently smoking OR have quit w/in 15years.) does not qualify.   Lung Cancer Screening Referral: no  Additional Screening:  Hepatitis C Screening: does not qualify;  Vision Screening: Recommended annual ophthalmology exams for early detection of glaucoma and other disorders of the eye. Is the patient up to date with their annual eye exam?  No  Who is the provider or what is the name of the office in which the patient attends annual eye exams? none If pt is not established with a provider, would they like to be referred to a provider to establish care? No .   Dental Screening: Recommended annual dental exams for proper oral hygiene  Community Resource Referral / Chronic Care Management: CRR required this visit?  No   CCM required this visit?  No      Plan:     I have personally reviewed and noted the following in the patient's chart:   Medical and social history Use of alcohol, tobacco or illicit drugs  Current medications and supplements including opioid prescriptions. Patient is not  currently taking opioid prescriptions. Functional ability and status Nutritional status Physical activity Advanced directives List of other physicians Hospitalizations, surgeries, and ER visits in previous 12 months Vitals Screenings to include cognitive, depression, and falls Referrals and appointments  In addition, I have reviewed and discussed with patient certain preventive protocols, quality metrics, and best practice recommendations. A written personalized care plan for preventive services as well as general preventive health recommendations were provided to patient.     Kellie Simmering, LPN   12/21/8180   Nurse Notes:

## 2021-01-28 ENCOUNTER — Telehealth: Payer: Self-pay

## 2021-01-30 ENCOUNTER — Ambulatory Visit: Payer: Medicare Other | Admitting: Family Medicine

## 2021-01-30 ENCOUNTER — Encounter: Payer: Self-pay | Admitting: Family Medicine

## 2021-01-30 ENCOUNTER — Ambulatory Visit (INDEPENDENT_AMBULATORY_CARE_PROVIDER_SITE_OTHER): Payer: Medicare Other | Admitting: Family Medicine

## 2021-01-30 ENCOUNTER — Other Ambulatory Visit: Payer: Self-pay

## 2021-01-30 VITALS — BP 131/69 | HR 83 | Ht 70.5 in | Wt 227.4 lb

## 2021-01-30 DIAGNOSIS — E782 Mixed hyperlipidemia: Secondary | ICD-10-CM

## 2021-01-30 DIAGNOSIS — I69319 Unspecified symptoms and signs involving cognitive functions following cerebral infarction: Secondary | ICD-10-CM

## 2021-01-30 DIAGNOSIS — I129 Hypertensive chronic kidney disease with stage 1 through stage 4 chronic kidney disease, or unspecified chronic kidney disease: Secondary | ICD-10-CM

## 2021-01-30 DIAGNOSIS — R413 Other amnesia: Secondary | ICD-10-CM | POA: Diagnosis not present

## 2021-01-30 DIAGNOSIS — J00 Acute nasopharyngitis [common cold]: Secondary | ICD-10-CM

## 2021-01-30 DIAGNOSIS — N401 Enlarged prostate with lower urinary tract symptoms: Secondary | ICD-10-CM

## 2021-01-30 DIAGNOSIS — I6930 Unspecified sequelae of cerebral infarction: Secondary | ICD-10-CM

## 2021-01-30 DIAGNOSIS — N183 Chronic kidney disease, stage 3 unspecified: Secondary | ICD-10-CM | POA: Diagnosis not present

## 2021-01-30 DIAGNOSIS — N138 Other obstructive and reflux uropathy: Secondary | ICD-10-CM

## 2021-01-30 MED ORDER — IPRATROPIUM BROMIDE 0.06 % NA SOLN: 2.0000 | Freq: Four times a day (QID) | NASAL | 0 refills | Status: DC

## 2021-01-30 MED ORDER — CLOPIDOGREL BISULFATE 75 MG PO TABS: 75.0000 mg | ORAL_TABLET | Freq: Every morning | ORAL | 3 refills | Status: DC

## 2021-01-30 MED ORDER — FINASTERIDE 5 MG PO TABS: 5.0000 mg | ORAL_TABLET | Freq: Every day | ORAL | 3 refills | Status: DC

## 2021-01-30 MED ORDER — ATORVASTATIN CALCIUM 40 MG PO TABS: 40.0000 mg | ORAL_TABLET | Freq: Every day | ORAL | 3 refills | Status: DC

## 2021-01-30 NOTE — Patient Instructions (Addendum)
Thank you for coming to the office today.  Restart medications as discussed today  Likely difficulty urinating because of not taking Finasteride. You can restart med and follow-up with Urologist.  Restarted Cholesterol pill and Blood Thinner. Rx sent to [facility] for FASTING BLOOD WORK (no food or drink after midnight before the lab appointment, only water or coffee without cream/sugar on the morning of)  SCHEDULE "Lab Only" visit in the morning at the clinic for lab draw in 6 MONTHS   - Make sure Lab Only appointment is at about 1 week before your next appointment, so that results will be available  For Lab Results, once available within 2-3 days of blood draw, you can can log in to MyChart online to view your results and a brief explanation. Also, we can discuss results at next follow-up visit.   Please schedule a Follow-up Appointment to: Return in about 6 months (around 07/31/2021) for 6 month follow-up HTN, Memory, HLD AM apt fasting lab AFTER.  If you have any other questions or concerns, please feel free to call the office or send a message through [place]. You may also schedule an earlier appointment if necessary.  Additionally, you may be receiving a survey about your experience at our office within a few days to 1 week by e-mail or mail. We value your feedback.  Nobie Putnam, DO [place]

## 2021-01-30 NOTE — Progress Notes (Signed)
Subjective:    Patient ID: [name], male    DOB: 08-05-1934, 86 y.o.   MRN: [id number]  [name] is a 86 y.o. male presenting on 01/30/2021 for Memory Loss   HPI  CHRONIC HTN w/ CKD III Limited med adherence lately off alpha blocker Current Meds - Lisinopril 20mg  daily Reports good compliance, took meds today. Tolerating well, w/o complaints. Lifestyle: - Diet: balanced diet - Exercise: walking Denies CP, dyspnea, HA, edema, dizziness / lightheadedness   Hyperlipidemia History of CVA Cognitive Decline following stroke. Previuosly On Atorvastatin 40mg , ASA 81 and Plavix 75mg  was on DAPT for stroke secondary prevention. Needs help adhering to medicines.   Elevated PSA / BPH Nocturia Followed by Urology [facility] He had Prostate MRI, and on Finasteride. - now off Finasteride self DC'd Still has nocturia and urinary frequency. He denies having sleeping issue but has nocturia that interferes with sleep.    Health Maintenance: Declines any Flu Shot or vaccines.  Depression screen [facility] 2/9 01/21/2021 09/30/2020 06/26/2019  Decreased Interest 0 0 0  Down, Depressed, Hopeless 0 0 0  PHQ - 2 Score 0 0 0    Social History   Tobacco Use   Smoking status: Former    Types: Cigarettes    Quit date: 1985    Years since quitting: 37.8   Smokeless tobacco: Former  Scientific laboratory technician Use: Never used  Substance Use Topics   Alcohol use: No   Drug use: No    Review of Systems Per HPI unless specifically indicated above     Objective:    BP 131/69   Pulse 83   Ht 5' 10.5" (1.791 m)   Wt 227 lb 6.4 oz (103.1 kg)   SpO2 96%   BMI 32.17 kg/m   Wt Readings from Last 3 Encounters:  01/30/21 227 lb 6.4 oz (103.1 kg)  01/21/21 215 lb (97.5 kg)  09/30/20 214 lb (97.1 kg)    Physical Exam Vitals and nursing note reviewed.  Constitutional:      General: He is not in acute distress.    Appearance: He is well-developed. He is not diaphoretic.     Comments: Well-appearing,  comfortable, cooperative  HENT:     Head: Normocephalic and atraumatic.  Eyes:     General:        Right eye: No discharge.        Left eye: No discharge.     Conjunctiva/sclera: Conjunctivae normal.  Neck:     Thyroid: No thyromegaly.  Cardiovascular:     Rate and Rhythm: Normal rate and regular rhythm.     Heart sounds: Normal heart sounds. No murmur heard. Pulmonary:     Effort: Pulmonary effort is normal. No respiratory distress.     Breath sounds: Normal breath sounds. No wheezing or rales.  Musculoskeletal:        General: Normal range of motion.     Cervical back: Normal range of motion and neck supple.  Lymphadenopathy:     Cervical: No cervical adenopathy.  Skin:    General: Skin is warm and dry.     Findings: No erythema or rash.  Neurological:     Mental Status: He is alert and oriented to person, place, and time.  Psychiatric:        Behavior: Behavior normal.     Comments: Well groomed, good eye contact, normal speech and thoughts. Short term recall is limited.    Results for orders  placed or performed in visit on 06/26/20  Bladder Scan (Post Void Residual) in office  Result Value Ref Range   Scan Result 40mL       Assessment & Plan:   Problem List Items Addressed This Visit     Memory loss   HLD (hyperlipidemia)   Relevant Medications   atorvastatin (LIPITOR) 40 MG tablet   History of cerebrovascular accident (CVA) with residual deficit   Relevant Medications   clopidogrel (PLAVIX) 75 MG tablet   Cognitive deficit as late effect of cerebrovascular accident (CVA)   BPH with obstruction/lower urinary tract symptoms   Relevant Medications   finasteride (PROSCAR) 5 MG tablet   Benign hypertension with CKD (chronic kidney disease) stage III (HCC) - Primary   Relevant Medications   atorvastatin (LIPITOR) 40 MG tablet   Other Visit Diagnoses     Acute rhinitis       Relevant Medications   ipratropium (ATROVENT) 0.06 % nasal spray        Rhinitis Start Atrovent nasal spray decongestant 2 sprays in each nostril up to 4 times daily for 7 days  Hx CVA Cognitive residual deficit post CVA Stable chronic problem with gradual memory decline Limited caregiver support, has wife at home Restart DAPT plavix and Statin for secondary risk reduction - he was off meds for months  BPH Followed by [facility] Now some difficulty voiding, was on finasteride Restart finasteride today to improve voiding F/u with urology if not improved   Meds ordered this encounter  Medications   DISCONTD: ipratropium (ATROVENT) 0.06 % nasal spray    Sig: Place 2 sprays into both nostrils 4 (four) times daily. For up to 5-7 days then stop.    Dispense:  15 mL    Refill:  0   clopidogrel (PLAVIX) 75 MG tablet    Sig: Take 1 tablet (75 mg total) by mouth in the morning. (blood thinner)    Dispense:  90 tablet    Refill:  3   atorvastatin (LIPITOR) 40 MG tablet    Sig: Take 1 tablet (40 mg total) by mouth at bedtime. (Cholesterol)    Dispense:  90 tablet    Refill:  3   finasteride (PROSCAR) 5 MG tablet    Sig: Take 1 tablet (5 mg total) by mouth daily.    Dispense:  90 tablet    Refill:  3   ipratropium (ATROVENT) 0.06 % nasal spray    Sig: Place 2 sprays into both nostrils 4 (four) times daily. For up to 5-7 days then stop.    Dispense:  15 mL    Refill:  0      Follow up plan: Return in about 6 months (around 07/31/2021) for 6 month follow-up HTN, Memory, HLD AM apt fasting lab AFTER.   Nobie Putnam, [facility] 01/30/2021, 10:10 AM

## 2021-02-04 ENCOUNTER — Ambulatory Visit (INDEPENDENT_AMBULATORY_CARE_PROVIDER_SITE_OTHER): Payer: Medicare Other | Admitting: Licensed Clinical Social Worker

## 2021-02-04 DIAGNOSIS — N183 Chronic kidney disease, stage 3 unspecified: Secondary | ICD-10-CM

## 2021-02-04 DIAGNOSIS — I129 Hypertensive chronic kidney disease with stage 1 through stage 4 chronic kidney disease, or unspecified chronic kidney disease: Secondary | ICD-10-CM

## 2021-02-04 DIAGNOSIS — R413 Other amnesia: Secondary | ICD-10-CM

## 2021-02-04 DIAGNOSIS — E782 Mixed hyperlipidemia: Secondary | ICD-10-CM

## 2021-02-05 NOTE — Chronic Care Management (AMB) (Signed)
Chronic Care Management    Clinical Social Work Note  02/05/2021 Name: [name] MRN: [id number] DOB: 04-02-1935  [name] is a 86 y.o. year old male who is a primary care patient of [name], DO. The CCM team was consulted to assist the patient with chronic disease management and/or care coordination needs related to: Level of Care Concerns.   Engaged with patient by telephone for follow up visit in response to provider referral for social work chronic care management and care coordination services.   Consent to Services:  The patient was given information about Chronic Care Management services, agreed to services, and gave verbal consent prior to initiation of services.  Please see initial visit note for detailed documentation.   Patient agreed to services and consent obtained.   Consent to Services:  The patient was given information about Care Management services, agreed to services, and gave verbal consent prior to initiation of services.  Please see initial visit note for detailed documentation.   Patient agreed to services today and consent obtained.  Engaged with patient by phone in response to provider referral for social work care coordination services:  Assessment/Interventions:  Patient continues to maintain positive progress with care plan goals. States he plans to pick up medications and start utilizing pill box to assist with remembering to take medications. CCM [name] will follow up with patient in one week.  See Care Plan below for interventions and patient self-care activities.  Recent life changes or stressors: Memory concerns  Recommendation: Patient may benefit from, and is in agreement work with [name] to address care coordination needs and will continue to work with the clinical team to address health care and disease management related needs.   Follow up Plan: Patient would like continued follow-up from CCM [name] .  per patient's request will  follow up in 02/11/21.  Will call office if needed prior to next encounter.   SDOH (Social Determinants of Health) assessments and interventions performed:  NA  Advanced Directives Status: Not addressed in this encounter.  CCM Care Plan  No Known Allergies  Outpatient Encounter Medications as of 02/04/2021  Medication Sig   aspirin EC 81 MG tablet Take 1 tablet (81 mg total) by mouth in the morning. (blood thinner)   atorvastatin (LIPITOR) 40 MG tablet Take 1 tablet (40 mg total) by mouth at bedtime. (Cholesterol)   clopidogrel (PLAVIX) 75 MG tablet Take 1 tablet (75 mg total) by mouth in the morning. (blood thinner)   finasteride (PROSCAR) 5 MG tablet Take 1 tablet (5 mg total) by mouth daily.   ipratropium (ATROVENT) 0.06 % nasal spray Place 2 sprays into both nostrils 4 (four) times daily. For up to 5-7 days then stop.   lisinopril (ZESTRIL) 20 MG tablet Take 20 mg by mouth in the morning. (Blood pressure)   Multiple Vitamins-Minerals (MULTIVITAMIN ADULTS 50+ PO) Take by mouth.   No facility-administered encounter medications on file as of 02/04/2021.    Patient Active Problem List   Diagnosis Date Noted   Cognitive deficit as late effect of cerebrovascular accident (CVA) 06/25/2020   Essential hypertension 06/25/2020   Imbalance 02/01/2018   Humeral head fracture, left, closed, initial encounter 09/22/2017   History of cerebrovascular accident (CVA) with residual deficit 09/09/2017   CKD (chronic kidney disease), stage III ([place]) 09/01/2017   Elevated PSA, between 10 and less than 20 ng/ml 07/22/2016   Bilateral lower extremity edema 07/22/2016   Abnormal glucose 07/15/2016   BPH  with obstruction/lower urinary tract symptoms 04/10/2016   Benign hypertension with CKD (chronic kidney disease) stage III ([place]) 04/10/2016   Memory loss 04/10/2016   Gastric outlet obstruction 12/02/2014   Generalized abdominal pain    Benign fibroma of prostate 01/02/2011   HLD (hyperlipidemia)  01/02/2011   Obesity (BMI 30.0-34.9) 01/02/2011    Conditions to be addressed/monitored:  Memory concerns  Care Plan : General Social Work (Adult)  Updates made by Rebekah Chesterfield, [name] since 02/05/2021 12:00 AM     Problem: Quality of Life (General Plan of Care)      Long-Range Goal: Quality of Life Maintained   Start Date: 06/19/2020  This Visit's Progress: On track  Recent Progress: On track  Priority: Medium  Note:   Timeframe:  Long-Range Goal Priority:  Medium Start Date:   07/09/20                   Expected End Date: 02/17/21                   Follow Up Date- 02/11/21  Current Bar[place]:  ADL IADL limitations Limited access to caregiver Inability to perform ADL's independently Inability to perform IADL's independently Clinical Goals: Patient will work with CCM [name] to address needs related to management of chronic medical conditions and psychosocial stressors that negatively impacts health mental and physical health conditions Clinical Interventions:  Assessment of needs and barriers of treatment discussed Patient reports that he is "doing much better" Patient shared that he has been managing his appointments by writing them down 10/18: Patient reports that he has had difficulty remembering to take his medications. States he is picking up refills today and plans to return to utilizing pill box CCM [name] commended patient for being active in the management of health and discussed additional strategies to assist with memory changes Patient states that he has difficulty urinating properly "When I urinate, it's more like a drizzle" CCM [name] informed patient that she would notify PCP of this CCM [name] updated patient of upcoming appointments, including with Urology on 12/03/20 Patient wrote information down and shared that he has stable transportation to the appointment  Patient continues to receive strong support from family and friends CCM [name] completed SDOH screens to assess  for any basic needs/safety concerns. Patient remains independent in home and reports no psychosocial needs Collaboration with Parks Ranger, Devonne Doughty, DO regarding development and update of comprehensive plan of care as evidenced by provider attestation and co-signature Inter-disciplinary care team collaboration (see longitudinal plan of care) Other interventions provided:Mindfulness or Relaxation Training, Active listening / Reflection utilized , and Emotional Supportive Provided Patient Goals/Self-Care Activities: Over the next 90 days Attend all scheduled provider appointments Call provider office with any new concerns or questions Continue practicing gratitude thinking to promote positive mood      Christa See, MSW, [email or web address] Phone [phone] 9:40 AM

## 2021-02-05 NOTE — Patient Instructions (Signed)
Visit Information   Goals Addressed               This Visit's Progress     Patient Stated     Obtain Coping Skills to assist with Chronic Health Conditions (pt-stated)   On track     Timeframe:  Long-Range Goal Priority:  Medium Start Date:   07/09/20                   Expected End Date: 02/17/21                   Follow Up Date- 02/11/21  Patient Goals/Self-Care Activities: Over the next 90 days Attends all scheduled provider appointments Calls provider office for new concerns or questions Continue practicing gratitude thinking to promote positive mood        Patient verbalizes understanding of instructions provided today.   Telephone follow up appointment with care management team member scheduled for:02/11/21  [name], [name], [name] Phone [phone] 10:04 AM

## 2021-02-11 ENCOUNTER — Telehealth: Payer: Medicare Other

## 2021-02-12 ENCOUNTER — Telehealth: Payer: Self-pay

## 2021-02-12 NOTE — Telephone Encounter (Signed)
Copied from [place] [id number]. Topic: General - Other >> Feb 12, 2021 10:10 AM Leward Quan A wrote: Reason for CRM: Patient called in asking to speak to Dr Raliegh Ip states that he need to discuss his wife possibly having dementia say that she seem to be forgetting more things theses days and that he is concerned and need to know what to do. Please call Ph# [phone]    The pt called with concerns about his wife Keric Zehren memory issues. Appointment scheduled with Rollene Fare for Monday at 8:40am.

## 2021-02-13 ENCOUNTER — Telehealth: Payer: Self-pay | Admitting: Licensed Clinical Social Worker

## 2021-02-13 NOTE — Telephone Encounter (Signed)
    Clinical Social Work  Care Management   Phone Outreach    02/13/2021 Name: [name] MRN: [id number] DOB: 1935-03-01  [name] is a 86 y.o. year old male who is a primary care patient of [name], DO .   Reason for referral: [employer].    F/U phone call today to assess needs, progress and barriers with care plan goals.   Telephone outreach was unsuccessful. A HIPPA compliant phone message was left for the patient providing contact information and requesting a return call.   Plan:CCM 30 will wait for return call. If no return call is received, Will reach out to patient again in the next 30 days .   Review of patient status, including review of consultants reports, relevant laboratory and other test results, and collaboration with appropriate care team members and the patient's provider was performed as part of comprehensive patient evaluation and provision of care management services.    Christa See, MSW, [email or web address] Phone [phone] 12:41 PM

## 2021-02-17 ENCOUNTER — Ambulatory Visit (INDEPENDENT_AMBULATORY_CARE_PROVIDER_SITE_OTHER): Payer: Medicare Other

## 2021-02-17 ENCOUNTER — Other Ambulatory Visit: Payer: Self-pay

## 2021-02-17 DIAGNOSIS — Z23 Encounter for immunization: Secondary | ICD-10-CM | POA: Diagnosis not present

## 2021-02-17 DIAGNOSIS — N183 Chronic kidney disease, stage 3 unspecified: Secondary | ICD-10-CM | POA: Diagnosis not present

## 2021-02-17 DIAGNOSIS — I129 Hypertensive chronic kidney disease with stage 1 through stage 4 chronic kidney disease, or unspecified chronic kidney disease: Secondary | ICD-10-CM | POA: Diagnosis not present

## 2021-02-17 DIAGNOSIS — E782 Mixed hyperlipidemia: Secondary | ICD-10-CM

## 2021-03-06 ENCOUNTER — Telehealth: Payer: Self-pay

## 2021-03-06 ENCOUNTER — Telehealth: Payer: Medicare Other

## 2021-03-06 NOTE — Telephone Encounter (Signed)
  Care Management   Follow Up Note   03/06/2021 Name: [name] MRN: [id number] DOB: [birth year]   Referred by: [name], DO Reason for referral : Chronic Care Management (RNCM: Follow up for Chronic Disease Management and Care Coordination Needs )   An unsuccessful telephone outreach was attempted today. The patient was referred to the case management team for assistance with care management and care coordination.   Follow Up Plan: The care management team will reach out to the patient again over the next 30 days.   [name] RN, MSN, [place] Mobile: [phone]

## 2021-03-20 ENCOUNTER — Telehealth: Payer: Medicare Other

## 2021-03-20 ENCOUNTER — Telehealth: Payer: Self-pay

## 2021-03-20 NOTE — Telephone Encounter (Signed)
  Care Management   Follow Up Note   03/20/2021 Name: [name] MRN: [id number] DOB: 03/14/1935   Referred by: [name], DO Reason for referral : Chronic Care Management (RNCM: Follow up for Chronic Disease Management and Care Coordination Needs- 2nd attempt)   A second unsuccessful telephone outreach was attempted today. The patient was referred to the case management team for assistance with care management and care coordination.   Follow Up Plan: The care management team will reach out to the patient again over the next 30 days.   [name] RN, MSN, [place] Mobile: [phone]

## 2021-04-24 ENCOUNTER — Ambulatory Visit (INDEPENDENT_AMBULATORY_CARE_PROVIDER_SITE_OTHER): Payer: Medicare HMO

## 2021-04-24 ENCOUNTER — Telehealth: Payer: Medicare Other

## 2021-04-24 DIAGNOSIS — R413 Other amnesia: Secondary | ICD-10-CM

## 2021-04-24 DIAGNOSIS — N401 Enlarged prostate with lower urinary tract symptoms: Secondary | ICD-10-CM

## 2021-04-24 DIAGNOSIS — I129 Hypertensive chronic kidney disease with stage 1 through stage 4 chronic kidney disease, or unspecified chronic kidney disease: Secondary | ICD-10-CM

## 2021-04-24 DIAGNOSIS — E782 Mixed hyperlipidemia: Secondary | ICD-10-CM

## 2021-04-24 DIAGNOSIS — I69319 Unspecified symptoms and signs involving cognitive functions following cerebral infarction: Secondary | ICD-10-CM

## 2021-04-24 DIAGNOSIS — I10 Essential (primary) hypertension: Secondary | ICD-10-CM

## 2021-04-24 DIAGNOSIS — N183 Chronic kidney disease, stage 3 unspecified: Secondary | ICD-10-CM

## 2021-04-24 DIAGNOSIS — N138 Other obstructive and reflux uropathy: Secondary | ICD-10-CM

## 2021-04-24 DIAGNOSIS — Z9181 History of falling: Secondary | ICD-10-CM

## 2021-04-24 NOTE — Chronic Care Management (AMB) (Signed)
Chronic Care Management   CCM RN Visit Note  04/24/2021 Name: [name] MRN: [id number] DOB: 04-10-1935  Subjective: [name] is a 87 y.o. year old male who is a primary care patient of Jonathan Hauser, DO. The care management team was consulted for assistance with disease management and care coordination needs.    Engaged with patient by telephone for follow up visit in response to provider referral for case management and/or care coordination services.   Consent to Services:  The patient was given information about Chronic Care Management services, agreed to services, and gave verbal consent prior to initiation of services.  Please see initial visit note for detailed documentation.   Patient agreed to services and verbal consent obtained.   Assessment: Review of patient past medical history, allergies, medications, health status, including review of consultants reports, laboratory and other test data, was performed as part of comprehensive evaluation and provision of chronic care management services.   SDOH (Social Determinants of Health) assessments and interventions performed:    CCM Care Plan  No Known Allergies  Outpatient Encounter Medications as of 04/24/2021  Medication Sig   aspirin EC 81 MG tablet Take 1 tablet (81 mg total) by mouth in the morning. (blood thinner)   atorvastatin (LIPITOR) 40 MG tablet Take 1 tablet (40 mg total) by mouth at bedtime. (Cholesterol)   clopidogrel (PLAVIX) 75 MG tablet Take 1 tablet (75 mg total) by mouth in the morning. (blood thinner)   finasteride (PROSCAR) 5 MG tablet Take 1 tablet (5 mg total) by mouth daily.   ipratropium (ATROVENT) 0.06 % nasal spray Place 2 sprays into both nostrils 4 (four) times daily. For up to 5-7 days then stop.   lisinopril (ZESTRIL) 20 MG tablet Take 20 mg by mouth in the morning. (Blood pressure)   Multiple Vitamins-Minerals (MULTIVITAMIN ADULTS 50+ PO) Take by mouth.   terazosin (HYTRIN) 5 MG  capsule Take 5 mg by mouth at bedtime.   No facility-administered encounter medications on file as of 04/24/2021.    Patient Active Problem List   Diagnosis Date Noted   Cognitive deficit as late effect of cerebrovascular accident (CVA) 06/25/2020   Essential hypertension 06/25/2020   Imbalance 02/01/2018   [place] head fracture, left, closed, initial encounter 09/22/2017   History of cerebrovascular accident (CVA) with residual deficit 09/09/2017   CKD (chronic kidney disease), stage III ([place]) 09/01/2017   Elevated PSA, between 10 and less than 20 ng/ml 07/22/2016   Bilateral lower extremity edema 07/22/2016   Abnormal glucose 07/15/2016   BPH with obstruction/lower urinary tract symptoms 04/10/2016   Benign hypertension with CKD (chronic kidney disease) stage III ([place]) 04/10/2016   Memory loss 04/10/2016   Gastric outlet obstruction 12/02/2014   Generalized abdominal pain    Benign fibroma of prostate 01/02/2011   HLD (hyperlipidemia) 01/02/2011   Obesity (BMI 30.0-34.9) 01/02/2011    Conditions to be addressed/monitored:HTN, HLD, CKD Stage 3, BPH, and memory loss and falls prevention   Care Plan : RNCM: General Plan of Care (Adult) for Chronic Disease Management and Care Coordination Needs  Updates made by [name], RN since 04/24/2021 12:00 AM     Problem: RNCM: Development of Plan of Care for Chronic Disease Management (HTN, HLD, Memory loss, Falls, CKD3, BPH)   Priority: High     Long-Range Goal: RNCM: Effective Management  of Plan of Care for Chronic Disease Management (HTN, HLD, Memory loss, Falls, CKD3, BPH)   Start Date: 04/24/2021  Expected End Date: 04/24/2022  Priority: High  Note:   Current Barriers:  Knowledge Deficits related to plan of care for management of HTN, HLD, CKD Stage 3, and Memory loss, BPH  Care Coordination needs related to Mental Health Concerns   Chronic Disease Management support and education needs related to HTN, HLD, CKD Stage 3, and  Memory loss, BPH Cognitive Deficits  RNCM Clinical Goal(s):  Patient will verbalize basic understanding of HTN, HLD, CKD Stage 3, BPH, and Memory Loss disease process and self health management plan as evidenced by keeping appointments, taking medications, following diet, and working with the CCM team to optimize health and well being. take all medications exactly as prescribed and will call provider for medication related questions as evidenced by compliance with medications and calling for refills before running out of medications    attend all scheduled medical appointments: 08-05-2021 at 0820 am as evidenced by keeping appointments and calling for schedule change needs. The patient ask the RNCM to assist him with getting an appointment to see urologist for follow up.        demonstrate improved and ongoing adherence to prescribed treatment plan for HTN, HLD, CKD Stage 3, BPH, and memory loss and falls as evidenced by stable conditions, stable VS, no acute changes in condition, and seeing providers on a regular basis. demonstrate a decrease in HTN, HLD, CKD Stage 3, BPH, and memory loss, and falls exacerbations   as evidenced by effective management of chronic disease management  through collaboration with RN Care manager, provider, and care team.   Interventions: 1:1 collaboration with primary care provider regarding development and update of comprehensive plan of care as evidenced by provider attestation and co-signature Inter-disciplinary care team collaboration (see longitudinal plan of care) Evaluation of current treatment plan related to  self management and patient's adherence to plan as established by provider   Chronic Kidney Disease/BPH (Status: Goal on Track (progressing): YES.)  Long Term Goal  Last practice recorded BP readings:  BP Readings from Last 3 Encounters:  01/30/21 131/69  09/30/20 (!) 115/54  08/06/20 (!) 162/76  Most recent eGFR/CrCl: No results found for: EGFR  No  components found for: CRCL  Assessed the patient understanding of chronic kidney disease    Evaluation of current treatment plan related to chronic kidney disease self management and patient's adherence to plan as established by provider. 04-24-2021: The patient states that he is doing well with everything but when he "pees" it is just a dribble. He has had this concern over a long period of time but states that it is getting worse. RNCM assisted the patient with calling [facility] at [phone]. Had to leave a detailed message and ask them to call the patient with an appointment, also left RNCM information for assistance. Sent in basket message to pcp for further recommendations. Will follow up with the patient for appointment information.      Provided education to patient re: stroke prevention, s/s of heart attack and stroke    Reviewed prescribed diet heart healthy diet Reviewed medications with patient and discussed importance of compliance. 04-24-2021: The patient reviewed the medications with the [facility] and states he is taking as directed.     Advised patient, providing education and rationale, to monitor blood pressure daily and record, calling PCP for findings outside established parameters    Discussed complications of poorly controlled blood pressure such as heart disease, stroke, circulatory complications, vision complications, kidney impairment, sexual dysfunction  Reviewed scheduled/upcoming provider appointments including: 04-24-2021: Called and left a message with [facility] asking for a call back to the patient with an appointment for follow up due to urinary retention and patient stating that it is getting worse.    Advised patient to discuss urinary retention, BPH and not being able to void properly with provider    Discussed plans with patient for ongoing care management follow up and provided patient with direct contact information for care  management team    Screening for signs and symptoms of depression related to chronic disease state      Discussed the impact of chronic kidney disease on daily life and mental health and acknowledged and normalized feelings of disempowerment, fear, and frustration    Assessed social determinant of health barriers    Provided education on kidney disease progression    Engage patient in early, proactive and ongoing discussion about goals of care and what matters most to them    Support coping and stress management by recognizing current strategies and assist in developing new strategies such as mindfulness, journaling, relaxation techniques, problem-solving      Falls:  (Status: Goal on Track (progressing): YES.) Long Term Goal  Provided written and verbal education re: potential causes of falls and Fall prevention strategies Reviewed medications and discussed potential side effects of medications such as dizziness and frequent urination Advised patient of importance of notifying provider of falls Assessed for signs and symptoms of orthostatic hypotension Assessed for falls since last encounter. 04-24-2021: Denies any new falls since last outreach Assessed patients knowledge of fall risk prevention secondary to previously provided education Provided patient information for fall alert systems Screening for signs and symptoms of depression related to chronic disease state Assessed social determinant of health barriers  Memory Loss  (Status: Goal on Track (progressing): YES.) Long Term Goal  Evaluation of current treatment plan related to  Memory loss , Memory Deficits self-management and patient's adherence to plan as established by provider. Discussed plans with patient for ongoing care management follow up and provided patient with direct contact information for care management team Advised patient to call the office for changes in memory, anxiety, depression; Provided education to patient re:  writing down appointments, keeping appointments, and an effective way to make sure the patient takes medications as directed.; Reviewed scheduled/upcoming provider appointments including 08-05-2021 at 0820 am; Discussed plans with patient for ongoing care management follow up and provided patient with direct contact information for care management team; Advised patient to discuss changes in memory  with provider; Screening for signs and symptoms of depression related to chronic disease state;  Assessed social determinant of health barriers;   Hyperlipidemia:  (Status: Goal on Track (progressing): YES.) Long Term Goal  Lab Results  Component Value Date   CHOL 163 06/13/2020   HDL 37 (L) 06/13/2020   LDLCALC 106 (H) 06/13/2020   TRIG 100 06/13/2020   CHOLHDL 4.4 06/13/2020     Medication review performed; medication list updated in electronic medical record.  Provider established cholesterol goals reviewed; Counseled on importance of regular laboratory monitoring as prescribed; Provided HLD educational materials; Reviewed role and benefits of statin for ASCVD risk reduction; Discussed strategies to manage statin-induced myalgias; Reviewed importance of limiting foods high in cholesterol;  Hypertension: (Status: Goal on Track (progressing): YES.) Long Term Goal  Last practice recorded BP readings:  BP Readings from Last 3 Encounters:  01/30/21 131/69  09/30/20 (!) 115/54  08/06/20 (!) 162/76  Most  recent eGFR/CrCl: No results found for: EGFR  No components found for: CRCL  Evaluation of current treatment plan related to hypertension self management and patient's adherence to plan as established by provider;   Provided education to patient re: stroke prevention, s/s of heart attack and stroke; Reviewed prescribed diet heart healthy Reviewed medications with patient and discussed importance of compliance;  Discussed plans with patient for ongoing care management follow up and provided  patient with direct contact information for care management team; Advised patient, providing education and rationale, to monitor blood pressure daily and record, calling PCP for findings outside established parameters;  Provided education on prescribed diet heart healthy;  Discussed complications of poorly controlled blood pressure such as heart disease, stroke, circulatory complications, vision complications, kidney impairment, sexual dysfunction;   Patient Goals/Self-Care Activities: Take medications as prescribed   Attend all scheduled provider appointments Call pharmacy for medication refills 3-7 days in advance of running out of medications Attend church or other social activities Perform all self care activities independently  Perform IADL's (shopping, preparing meals, housekeeping, managing finances) independently Call provider office for new concerns or questions  Work with the social worker to address care coordination needs and will continue to work with the clinical team to address health care and disease management related needs call the Suicide and Crisis Lifeline: 988 call the [place] National Suicide Prevention Lifeline: (986) 290-5862 or TTY: 973-745-5829 TTY (878)348-6035) to talk to a trained counselor call 1-800-273-TALK (toll free, 24 hour hotline) if experiencing a Mental Health or [place]  check blood pressure weekly choose a place to take my blood pressure (home, clinic or office, retail store) write blood pressure results in a log or diary learn about high blood pressure keep a blood pressure log take blood pressure log to all doctor appointments call doctor for signs and symptoms of high blood pressure develop an action plan for high blood pressure keep all doctor appointments take medications for blood pressure exactly as prescribed report new symptoms to your doctor eat more whole grains, fruits and vegetables, lean meats and healthy fats - call  for medicine refill 2 or 3 days before it runs out - take all medications exactly as prescribed - call doctor with any symptoms you believe are related to your medicine - call doctor when you experience any new symptoms - go to all doctor appointments as scheduled - adhere to prescribed diet: heart healthy diet       Plan:Telephone follow up appointment with care management team member scheduled for:  06-23-2021 at 345 pm  [place], MSN, [place] Mobile: [phone]

## 2021-04-24 NOTE — Patient Instructions (Signed)
Visit Information  Thank you for taking time to visit with me today. Please don't hesitate to contact me if I can be of assistance to you before our next scheduled telephone appointment.  Following are the goals we discussed today:  RNCM Clinical Goal(s):  Patient will verbalize basic understanding of HTN, HLD, CKD Stage 3, BPH, and Memory Loss disease process and self health management plan as evidenced by keeping appointments, taking medications, following diet, and working with the CCM team to optimize health and well being. take all medications exactly as prescribed and will call provider for medication related questions as evidenced by compliance with medications and calling for refills before running out of medications    attend all scheduled medical appointments: 08-05-2021 at 0820 am as evidenced by keeping appointments and calling for schedule change needs. The patient ask the RNCM to assist him with getting an appointment to see urologist for follow up.        demonstrate improved and ongoing adherence to prescribed treatment plan for HTN, HLD, CKD Stage 3, BPH, and memory loss and falls as evidenced by stable conditions, stable VS, no acute changes in condition, and seeing providers on a regular basis. demonstrate a decrease in HTN, HLD, CKD Stage 3, BPH, and memory loss, and falls exacerbations   as evidenced by effective management of chronic disease management  through collaboration with RN Care manager, provider, and care team.    Interventions: 1:1 collaboration with primary care provider regarding development and update of comprehensive plan of care as evidenced by provider attestation and co-signature Inter-disciplinary care team collaboration (see longitudinal plan of care) Evaluation of current treatment plan related to  self management and patient's adherence to plan as established by provider     Chronic Kidney Disease/BPH (Status: Goal on Track (progressing): YES.)  Long Term  Goal  Last practice recorded BP readings:     BP Readings from Last 3 Encounters:  01/30/21 131/69  09/30/20 (!) 115/54  08/06/20 (!) 162/76  Most recent eGFR/CrCl: No results found for: EGFR  No components found for: CRCL   Assessed the patient understanding of chronic kidney disease    Evaluation of current treatment plan related to chronic kidney disease self management and patient's adherence to plan as established by provider. 04-24-2021: The patient states that he is doing well with everything but when he "pees" it is just a dribble. He has had this concern over a long period of time but states that it is getting worse. RNCM assisted the patient with calling [facility] at [phone]. Had to leave a detailed message and ask them to call the patient with an appointment, also left RNCM information for assistance. Sent in basket message to pcp for further recommendations. Will follow up with the patient for appointment information.      Provided education to patient re: stroke prevention, s/s of heart attack and stroke    Reviewed prescribed diet heart healthy diet Reviewed medications with patient and discussed importance of compliance. 04-24-2021: The patient reviewed the medications with the [facility] and states he is taking as directed.     Advised patient, providing education and rationale, to monitor blood pressure daily and record, calling PCP for findings outside established parameters    Discussed complications of poorly controlled blood pressure such as heart disease, stroke, circulatory complications, vision complications, kidney impairment, sexual dysfunction    Reviewed scheduled/upcoming provider appointments including: 04-24-2021: Called and left a message with [employer] asking for  a call back to the patient with an appointment for follow up due to urinary retention and patient stating that it is getting worse.    Advised patient to discuss  urinary retention, BPH and not being able to void properly with provider    Discussed plans with patient for ongoing care management follow up and provided patient with direct contact information for care management team    Screening for signs and symptoms of depression related to chronic disease state      Discussed the impact of chronic kidney disease on daily life and mental health and acknowledged and normalized feelings of disempowerment, fear, and frustration    Assessed social determinant of health barriers    Provided education on kidney disease progression    Engage patient in early, proactive and ongoing discussion about goals of care and what matters most to them    Support coping and stress management by recognizing current strategies and assist in developing new strategies such as mindfulness, journaling, relaxation techniques, problem-solving        Falls:  (Status: Goal on Track (progressing): YES.) Long Term Goal  Provided written and verbal education re: potential causes of falls and Fall prevention strategies Reviewed medications and discussed potential side effects of medications such as dizziness and frequent urination Advised patient of importance of notifying provider of falls Assessed for signs and symptoms of orthostatic hypotension Assessed for falls since last encounter. 04-24-2021: Denies any new falls since last outreach Assessed patients knowledge of fall risk prevention secondary to previously provided education Provided patient information for fall alert systems Screening for signs and symptoms of depression related to chronic disease state Assessed social determinant of health barriers   Memory Loss  (Status: Goal on Track (progressing): YES.) Long Term Goal  Evaluation of current treatment plan related to  Memory loss , Memory Deficits self-management and patient's adherence to plan as established by provider. Discussed plans with patient for ongoing care  management follow up and provided patient with direct contact information for care management team Advised patient to call the office for changes in memory, anxiety, depression; Provided education to patient re: writing down appointments, keeping appointments, and an effective way to make sure the patient takes medications as directed.; Reviewed scheduled/upcoming provider appointments including 08-05-2021 at 0820 am; Discussed plans with patient for ongoing care management follow up and provided patient with direct contact information for care management team; Advised patient to discuss changes in memory  with provider; Screening for signs and symptoms of depression related to chronic disease state;  Assessed social determinant of health barriers;    Hyperlipidemia:  (Status: Goal on Track (progressing): YES.) Long Term Goal       Lab Results  Component Value Date    CHOL 163 06/13/2020    HDL 37 (L) 06/13/2020    LDLCALC 106 (H) 06/13/2020    TRIG 100 06/13/2020    CHOLHDL 4.4 06/13/2020      Medication review performed; medication list updated in electronic medical record.  Provider established cholesterol goals reviewed; Counseled on importance of regular laboratory monitoring as prescribed; Provided HLD educational materials; Reviewed role and benefits of statin for ASCVD risk reduction; Discussed strategies to manage statin-induced myalgias; Reviewed importance of limiting foods high in cholesterol;   Hypertension: (Status: Goal on Track (progressing): YES.) Long Term Goal  Last practice recorded BP readings:     BP Readings from Last 3 Encounters:  01/30/21 131/69  09/30/20 (!) 115/54  08/06/20 (!) 162/76  Most recent eGFR/CrCl: No results found for: EGFR  No components found for: CRCL   Evaluation of current treatment plan related to hypertension self management and patient's adherence to plan as established by provider;   Provided education to patient re: stroke  prevention, s/s of heart attack and stroke; Reviewed prescribed diet heart healthy Reviewed medications with patient and discussed importance of compliance;  Discussed plans with patient for ongoing care management follow up and provided patient with direct contact information for care management team; Advised patient, providing education and rationale, to monitor blood pressure daily and record, calling PCP for findings outside established parameters;  Provided education on prescribed diet heart healthy;  Discussed complications of poorly controlled blood pressure such as heart disease, stroke, circulatory complications, vision complications, kidney impairment, sexual dysfunction;    Patient Goals/Self-Care Activities: Take medications as prescribed   Attend all scheduled provider appointments Call pharmacy for medication refills 3-7 days in advance of running out of medications Attend church or other social activities Perform all self care activities independently  Perform IADL's (shopping, preparing meals, housekeeping, managing finances) independently Call provider office for new concerns or questions  Work with the social worker to address care coordination needs and will continue to work with the clinical team to address health care and disease management related needs call the Suicide and Crisis Lifeline: 988 call the [place] National Suicide Prevention Lifeline: 424 533 1225 or TTY: 785-626-3934 TTY 210-484-8236) to talk to a trained counselor call 1-800-273-TALK (toll free, 24 hour hotline) if experiencing a Mental Health or [place]  check blood pressure weekly choose a place to take my blood pressure (home, clinic or office, retail store) write blood pressure results in a log or diary learn about high blood pressure keep a blood pressure log take blood pressure log to all doctor appointments call doctor for signs and symptoms of high blood pressure develop an  action plan for high blood pressure keep all doctor appointments take medications for blood pressure exactly as prescribed report new symptoms to your doctor eat more whole grains, fruits and vegetables, lean meats and healthy fats - call for medicine refill 2 or 3 days before it runs out - take all medications exactly as prescribed - call doctor with any symptoms you believe are related to your medicine - call doctor when you experience any new symptoms - go to all doctor appointments as scheduled - adhere to prescribed diet: heart healthy diet    Our next appointment is by telephone on 06-23-2021 at 3:45 pm   Please call the care guide team at [phone] if you need to cancel or reschedule your appointment.   If you are experiencing a Mental Health or [place] or need someone to talk to, please call the Suicide and Crisis Lifeline: 988 call the [place] National Suicide Prevention Lifeline: 319-386-2301 or TTY: 402 451 4466 TTY 302-602-2352) to talk to a trained counselor call 1-800-273-TALK (toll free, 24 hour hotline)   The patient verbalized understanding of instructions, educational materials, and care plan provided today and declined offer to receive copy of patient instructions, educational materials, and care plan.   [name] RN, MSN, [facility] [place] Mobile: [phone]

## 2021-04-28 ENCOUNTER — Ambulatory Visit: Payer: Medicare Other | Admitting: Urology

## 2021-04-28 ENCOUNTER — Other Ambulatory Visit: Payer: Self-pay | Admitting: *Deleted

## 2021-04-28 DIAGNOSIS — N401 Enlarged prostate with lower urinary tract symptoms: Secondary | ICD-10-CM

## 2021-04-28 DIAGNOSIS — N138 Other obstructive and reflux uropathy: Secondary | ICD-10-CM

## 2021-05-11 NOTE — Progress Notes (Signed)
05/12/2021 9:43 AM   [name] December 01, 1934 [id number]  Referring provider: [name], DO [address] [place],  [place] [address]  Chief Complaint  Patient presents with   Benign Prostatic Hypertrophy   Urological history: 1. Elevated PSA - PSA Trend PSA, Total 0 - 4 ng/mL 2.34   2012   Component     Latest Ref Rng & Units 07/20/2016  PSA, Total     <=4.0 ng/mL 11.8 (H)   Component     Latest Ref Rng & Units 10/01/2016 04/09/2017 10/04/2017 11/08/2018  Prostate Specific Ag, Serum     0.0 - 4.0 ng/mL 10.6 (H) 11.1 (H) 17.9 (H) 17.4 (H)   Component     Latest Ref Rng & Units 06/13/2020  PSA     < OR = 4.0 ng/mL 24.34 (H)   Prostate MRI 08/2020 - No evidence of high-grade or macroscopic prostate carcinoma.  Prostatomegaly with findings of bladder outlet obstruction.  Mild enlargement of a right inguinal node since 2019, now up to 11 mm. Presumably reactive.  Fat containing inguinal hernias, as before.  2. BPH with LU TS -prostate MRI 2022 - prostate volume 59 cc  -PVR 0 mL -managed with terazosin 4 mg daily   HPI: [name] is a 87 y.o. male who presents today for a weak urinary stream.    At his visit on 08/06/2020, his PSA was found to be 24.34 and he was advised to undergo a prostate MRI.  Prostate MRI was negative for high-grade or macroscopic prostate carcinoma.  Prostate volume 59 cc.  He was started on finasteride and was to follow up in 3 months.  He did not follow up as scheduled.  His UA was positive for micro heme 11-20 RBC's.    PVR 0 mL.    He has been experiencing drizzling of urine like a hose pipe just enough to mess up his shorts.  This has been happening for the last several months and he is know fed up with this symptoms.  He does not like to take medications and has been off several medications.  He believes that the finasteride was one of the medications and he has restarted the medications.   PMH: Past Medical History:  Diagnosis Date    Bowel obstruction (HCC)    BPH (benign prostatic hypertrophy)    Gastric outlet obstruction    High cholesterol    Hyperchloremia    Hypertension    Pancreatitis     Surgical History: Past Surgical History:  Procedure Laterality Date   ESOPHAGOGASTRODUODENOSCOPY N/A 12/03/2014   Procedure: ESOPHAGOGASTRODUODENOSCOPY (EGD);  Surgeon: Hulen Luster, MD;  [facility] ENDOSCOPY;  Service: Endoscopy;  Laterality: N/A;    Home Medications:  Allergies as of 05/12/2021   No Known Allergies      Medication List        Accurate as of May 12, 2021  9:43 AM. If you have any questions, ask your nurse or doctor.          aspirin EC 81 MG tablet Take 1 tablet (81 mg total) by mouth in the morning. (blood thinner)   atorvastatin 40 MG tablet Commonly known as: LIPITOR Take 1 tablet (40 mg total) by mouth at bedtime. (Cholesterol)   clopidogrel 75 MG tablet Commonly known as: PLAVIX Take 1 tablet (75 mg total) by mouth in the morning. (blood thinner)   finasteride 5 MG tablet Commonly known as: PROSCAR Take 1 tablet (5  mg total) by mouth daily.   ipratropium 0.06 % nasal spray Commonly known as: ATROVENT Place 2 sprays into both nostrils 4 (four) times daily. For up to 5-7 days then stop.   lisinopril 20 MG tablet Commonly known as: ZESTRIL Take 20 mg by mouth in the morning. (Blood pressure)   MULTIVITAMIN ADULTS 50+ PO Take by mouth.   terazosin 5 MG capsule Commonly known as: HYTRIN Take 5 mg by mouth at bedtime.        Allergies: No Known Allergies  Family History: Family History  Problem Relation Age of Onset   Heart disease Mother    Heart disease Father    Heart disease Brother     Social History:  reports that he quit smoking about 38 years ago. His smoking use included cigarettes. He has quit using smokeless tobacco. He reports that he does not drink alcohol and does not use drugs.  For pertinent review of systems please refer to history of  present illness  Physical Exam: BP (!) 151/65 (BP Location: Left Arm, Patient Position: Sitting, Cuff Size: Large)    Pulse 86    Ht 5\' 10"  (1.778 m)    Wt 233 lb 3.2 oz (105.8 kg)    BMI 33.46 kg/m   Constitutional:  Well nourished. Alert and oriented, No acute distress. HEENT: [place] AT, mask in place.  Trachea midline Cardiovascular: No clubbing, cyanosis, or edema. Respiratory: Normal respiratory effort, no increased work of breathing. GU: No CVA tenderness.  No bladder fullness or masses.  Patient with uncircumcised phallus. Foreskin easily retracted  Urethral meatus is patent.  No penile discharge. No penile lesions or rashes. Scrotum without lesions, cysts, rashes and/or edema.  Testicles are located scrotally bilaterally. No masses are appreciated in the testicles. Left and right epididymis are normal. Neurologic: Grossly intact, no focal deficits, moving all 4 extremities. Psychiatric: Normal mood and affect.   Laboratory Data:   Component     Latest Ref Rng & Units 05/12/2021  Color, Urine     YELLOW YELLOW  Appearance     CLEAR CLEAR  Specific Gravity, Urine     1.005 - 1.030 1.020  pH     5.0 - 8.0 7.0  Glucose, UA     NEGATIVE mg/dL NEGATIVE  Hgb urine dipstick     NEGATIVE TRACE (A)  Bilirubin Urine     NEGATIVE NEGATIVE  Ketones, ur     NEGATIVE mg/dL NEGATIVE  Protein     NEGATIVE mg/dL TRACE (A)  Nitrite     NEGATIVE NEGATIVE  Leukocytes,Ua     NEGATIVE NEGATIVE  Squamous Epithelial / LPF     0 - 5 0-5  WBC, UA     0 - 5 WBC/hpf 0-5  RBC / HPF     0 - 5 RBC/hpf 11-20  Bacteria, UA     NONE SEEN NONE SEEN  Mucus      PRESENT  I have reviewed the labs.   Pertinent Imaging  05/12/21 09:28  Scan Result 68mL     Assessment & Plan:    1.  Elevated PSA -MRI prostate 2022 - negative for high risk disease -PSA pending   2. BPH with LUTS -PSA pending  -UA micro heme -PVR < 300 cc -most bothersome symptoms are dribbling  -continue conservative  management, avoiding bladder irritants and timed voiding's -Continue terazosin 5 mg daily and finasteride 5 mg daily-unsure if patient is compliant with these medications  3. Microscopic hematuria -  I explained to the patient that there are a number of causes that can be associated with blood in the urine, such as stones, BPH, UTI's, damage to the urinary tract and/or cancer.  -at this time, they are in a high risk stratification for hematuria per AUA guidelines due to their age (>60 years) and > 30 pack year smoking history -recommended studies for high risk are CT urogram and cysto - I explained to the patient that a contrast material will be injected into a vein and that in rare instances, an allergic reaction can result and may even life threatening (1:100,000)  The patient denies any allergies to contrast, iodine and/or seafood and is not taking metformin - Following the imaging study,  I've recommended a cystoscopy. I described how this is performed, typically in an office setting with a flexible cystoscope. We described the risks, benefits, and possible side effects, the most common of which is a minor amount of blood in the urine and/or burning which usually resolves in 24 to 48 hours.   - The patient had the opportunity to ask questions which were answered. Based upon this discussion, the patient is willing to proceed. Therefore, I've ordered: a CT Urogram and cystoscopy. - The patient will return following all of the above for discussion of the results.  -UA -Urine culture -BMP -I have concerns about patient's ability to understand his medical issues as during our conversation during today's visit he stated he stopped his medications because he felt better without them and I emphasized to him that the finasteride and the terazosin were to help him urinate and he continued to say he felt better without them.  I also had to explain to him several times regarding the causes of blood in the  urine and the importance of having the CT and the cystoscopy as well as how the 2 are performed.   -I feel there is a high probability that he will not show for these tests     Return for CT Urogram report and cystoscopy with Dr. Erlene Quan or Dr. Diamantina Providence for micro heme in [place] .     [name], PA-C  [facility] [address] [place], [place] [address] [phone]

## 2021-05-12 ENCOUNTER — Ambulatory Visit (INDEPENDENT_AMBULATORY_CARE_PROVIDER_SITE_OTHER): Payer: Medicare HMO | Admitting: Urology

## 2021-05-12 ENCOUNTER — Inpatient Hospital Stay: Admit: 2021-05-12 | Payer: TRICARE For Life (TFL) | Source: Home / Self Care

## 2021-05-12 ENCOUNTER — Other Ambulatory Visit: Payer: Self-pay

## 2021-05-12 ENCOUNTER — Other Ambulatory Visit
Admission: RE | Admit: 2021-05-12 | Discharge: 2021-05-12 | Disposition: A | Discharge status: Home / Self Care | Payer: Medicare HMO | Attending: Urology | Admitting: Urology

## 2021-05-12 ENCOUNTER — Encounter: Payer: Self-pay | Admitting: Urology

## 2021-05-12 VITALS — BP 151/65 | HR 86 | Ht 70.0 in | Wt 233.2 lb

## 2021-05-12 DIAGNOSIS — N138 Other obstructive and reflux uropathy: Secondary | ICD-10-CM | POA: Insufficient documentation

## 2021-05-12 DIAGNOSIS — N401 Enlarged prostate with lower urinary tract symptoms: Secondary | ICD-10-CM | POA: Insufficient documentation

## 2021-05-12 DIAGNOSIS — R972 Elevated prostate specific antigen [PSA]: Secondary | ICD-10-CM

## 2021-05-12 DIAGNOSIS — R3129 Other microscopic hematuria: Secondary | ICD-10-CM | POA: Diagnosis not present

## 2021-05-12 LAB — URINALYSIS, COMPLETE (UACMP) WITH MICROSCOPIC
Bacteria, UA: NONE SEEN
Bilirubin Urine: NEGATIVE
Glucose, UA: NEGATIVE mg/dL
Ketones, ur: NEGATIVE mg/dL
Leukocytes,Ua: NEGATIVE
Nitrite: NEGATIVE
Specific Gravity, Urine: 1.02 (ref 1.005–1.030)
pH: 7 (ref 5.0–8.0)

## 2021-05-12 LAB — BASIC METABOLIC PANEL
Anion gap: 6 (ref 5–15)
BUN: 27 mg/dL — ABNORMAL HIGH (ref 8–23)
CO2: 29 mmol/L (ref 22–32)
Calcium: 8.5 mg/dL — ABNORMAL LOW (ref 8.9–10.3)
Chloride: 103 mmol/L (ref 98–111)
Creatinine, Ser: 1.08 mg/dL (ref 0.61–1.24)
GFR, Estimated: >60 mL/min (ref >60)
Glucose, Bld: 95 mg/dL (ref 70–99)
Potassium: 4.6 mmol/L (ref 3.5–5.1)
Sodium: 138 mmol/L (ref 135–145)

## 2021-05-12 LAB — BLADDER SCAN AMB NON-IMAGING

## 2021-05-12 LAB — PSA: Prostatic Specific Antigen: 7.72 ng/mL — ABNORMAL HIGH (ref 0.00–4.00)

## 2021-05-17 LAB — URINE CULTURE: Culture: 10000 — AB

## 2021-05-19 ENCOUNTER — Telehealth: Payer: Self-pay | Admitting: *Deleted

## 2021-05-19 NOTE — Telephone Encounter (Signed)
-----   Message from [name], PA-C sent at 05/18/2021  6:14 PM EST ----- Please let Mr. Delaine Lame know that his urine culture was positive infection and we need to start Macrobid 100 mg twice daily for 7 days.  At this time, we will hold on the CT scan and the cystoscopy.  Once he has finished the Macrobid antibiotic, I will need to see him again in the office to recheck urinalysis and symptoms.

## 2021-05-20 DIAGNOSIS — N401 Enlarged prostate with lower urinary tract symptoms: Secondary | ICD-10-CM | POA: Diagnosis not present

## 2021-05-20 DIAGNOSIS — N183 Chronic kidney disease, stage 3 unspecified: Secondary | ICD-10-CM | POA: Diagnosis not present

## 2021-05-20 DIAGNOSIS — E782 Mixed hyperlipidemia: Secondary | ICD-10-CM

## 2021-05-20 DIAGNOSIS — I129 Hypertensive chronic kidney disease with stage 1 through stage 4 chronic kidney disease, or unspecified chronic kidney disease: Secondary | ICD-10-CM | POA: Diagnosis not present

## 2021-05-20 DIAGNOSIS — R338 Other retention of urine: Secondary | ICD-10-CM | POA: Diagnosis not present

## 2021-05-20 DIAGNOSIS — N138 Other obstructive and reflux uropathy: Secondary | ICD-10-CM

## 2021-05-20 DIAGNOSIS — I10 Essential (primary) hypertension: Secondary | ICD-10-CM | POA: Diagnosis not present

## 2021-05-26 ENCOUNTER — Telehealth: Payer: Self-pay | Admitting: *Deleted

## 2021-05-26 MED ORDER — NITROFURANTOIN MONOHYD MACRO 100 MG PO CAPS: 100.0000 mg | ORAL_CAPSULE | Freq: Two times a day (BID) | ORAL | 0 refills | Status: AC

## 2021-05-26 NOTE — Telephone Encounter (Signed)
rx sent to pharmacy by e-script  

## 2021-05-26 NOTE — Telephone Encounter (Signed)
-----   Message from [name], PA-C sent at 05/18/2021  6:14 PM EST ----- Please let [name] know that his urine culture was positive infection and we need to start Macrobid 100 mg twice daily for 7 days.  At this time, we will hold on the CT scan and the cystoscopy.  Once he has finished the Macrobid antibiotic, I will need to see him again in the office to recheck urinalysis and symptoms.

## 2021-05-27 ENCOUNTER — Other Ambulatory Visit: Payer: Medicare HMO | Admitting: Urology

## 2021-05-27 ENCOUNTER — Other Ambulatory Visit: Payer: Self-pay

## 2021-05-27 DIAGNOSIS — N138 Other obstructive and reflux uropathy: Secondary | ICD-10-CM

## 2021-05-27 DIAGNOSIS — I10 Essential (primary) hypertension: Secondary | ICD-10-CM

## 2021-05-27 DIAGNOSIS — E782 Mixed hyperlipidemia: Secondary | ICD-10-CM

## 2021-05-27 MED ORDER — ATORVASTATIN CALCIUM 40 MG PO TABS: 40.0000 mg | ORAL_TABLET | Freq: Every day | ORAL | 1 refills | Status: DC

## 2021-05-27 MED ORDER — LISINOPRIL 20 MG PO TABS: 20.0000 mg | ORAL_TABLET | Freq: Every morning | ORAL | 1 refills | Status: DC

## 2021-05-27 MED ORDER — FINASTERIDE 5 MG PO TABS: 5.0000 mg | ORAL_TABLET | Freq: Every day | ORAL | 1 refills | Status: DC

## 2021-05-28 ENCOUNTER — Other Ambulatory Visit: Payer: Self-pay

## 2021-05-28 DIAGNOSIS — N138 Other obstructive and reflux uropathy: Secondary | ICD-10-CM

## 2021-05-28 NOTE — Telephone Encounter (Signed)
LOV:  01/30/2021  NOV: 08/05/2021  Last Refill: ?

## 2021-05-29 MED ORDER — TERAZOSIN HCL 5 MG PO CAPS: 5.0000 mg | ORAL_CAPSULE | Freq: Every day | ORAL | 3 refills | Status: DC

## 2021-05-30 ENCOUNTER — Other Ambulatory Visit: Payer: Self-pay | Admitting: Family Medicine

## 2021-05-30 DIAGNOSIS — I6930 Unspecified sequelae of cerebral infarction: Secondary | ICD-10-CM

## 2021-05-30 NOTE — Telephone Encounter (Signed)
Patient called back to verify his medications. Per his bottles he has, he is taking all the medications on his current list. He says the amlodipine, buspirone and vistaril must be his wife's medications. I advised if she needs refills to call [facility] and let them know those are for her. He verbalized understanding.

## 2021-05-30 NOTE — Telephone Encounter (Signed)
[facility] called and spoke to [place], Technician about the medications requested. I advised Amlodipine, Buspirone, Vistaril are not on the patient's current or old medication list. She says they are the new pharmacy and the patient called in for these refills. I advised I can send in for Clopidogrel, but I will call the patient to verify the others, she verbalized understanding. Patient called, he was driving and not understanding the issue about the medications. He says he's on the way home. I asked him to call the office to speak to a nurse when he gets home and has his medication bottles in front of him, he verbalized understanding.

## 2021-05-30 NOTE — Addendum Note (Signed)
Addended by: Matilde Sprang on: 05/30/2021 02:05 PM   Modules accepted: Orders

## 2021-05-30 NOTE — Telephone Encounter (Signed)
Medication Refill - Medication: Amlodipine,Clopidogrel (PLAVIX) 75 MG Tablet,Buspirone 15mg ,Vistaril 25 Mg [place] from [facility] requesting 90 day refills.   Has the patient contacted their pharmacy? No. Pharmacy calling  (Agent: If no, request that the patient contact the pharmacy for the refill. If patient does not wish to contact the pharmacy document the reason why and proceed with request.)   Preferred Pharmacy (with phone number or street name):  [place], [place]  [place] [address]  Phone: [phone] Fax: [phone]  Hours: Not open 24 hours   Has the patient been seen for an appointment in the last year OR does the patient have an upcoming appointment? Yes.    Agent: Please be advised that RX refills may take up to 3 business days. We ask that you follow-up with your pharmacy.

## 2021-05-30 NOTE — Telephone Encounter (Signed)
Refilled 01/30/2021 #90 3 refills  - 1 year supply. Requested Prescriptions  Pending Prescriptions Disp Refills   clopidogrel (PLAVIX) 75 MG tablet 90 tablet 3    Sig: Take 1 tablet (75 mg total) by mouth in the morning. (blood thinner)     Hematology: Antiplatelets - clopidogrel Failed - 05/30/2021  2:05 PM      Failed - HCT in normal range and within 180 days    HCT  Date Value Ref Range Status  06/13/2020 35.0 (L) 38.5 - 50.0 % Final   Hematocrit  Date Value Ref Range Status  11/18/2018 39.8 37.5 - 51.0 % Final         Failed - HGB in normal range and within 180 days    Hemoglobin  Date Value Ref Range Status  06/13/2020 12.1 (L) 13.2 - 17.1 g/dL Final  11/18/2018 13.4 13.0 - 17.7 g/dL Final         Failed - PLT in normal range and within 180 days    Platelets  Date Value Ref Range Status  06/13/2020 194 140 - 400 Thousand/uL Final  11/18/2018 159 150 - 450 x10E3/uL Final         Passed - Cr in normal range and within 360 days    Creat  Date Value Ref Range Status  06/13/2020 1.25 (H) 0.70 - 1.11 mg/dL Final    Comment:    For patients >16 years of age, the reference limit for Creatinine is approximately 13% higher for people identified as African-American. .    Creatinine, Ser  Date Value Ref Range Status  05/12/2021 1.08 0.61 - 1.24 mg/dL Final         Passed - Valid encounter within last 6 months    Recent Outpatient Visits          4 months ago Benign hypertension with CKD (chronic kidney disease) stage III [facility])   [place], DO   8 months ago Benign hypertension with CKD (chronic kidney disease) stage III [facility])   [facility], Devonne Doughty, DO   11 months ago Cognitive deficit as late effect of cerebrovascular accident (CVA)   [facility] [name], DO   11 months ago Essential hypertension   [place], DO   2  years ago Fecal urgency   [place], DO      Future Appointments            In 2 months [place], Devonne Doughty, DO [facility], [place]   In 8 months  [facility], [place]

## 2021-06-04 ENCOUNTER — Other Ambulatory Visit: Payer: Self-pay | Admitting: Family Medicine

## 2021-06-04 ENCOUNTER — Ambulatory Visit (INDEPENDENT_AMBULATORY_CARE_PROVIDER_SITE_OTHER): Payer: Medicare HMO | Admitting: Pharmacist

## 2021-06-04 DIAGNOSIS — N183 Chronic kidney disease, stage 3 unspecified: Secondary | ICD-10-CM

## 2021-06-04 DIAGNOSIS — R413 Other amnesia: Secondary | ICD-10-CM

## 2021-06-04 DIAGNOSIS — I6930 Unspecified sequelae of cerebral infarction: Secondary | ICD-10-CM

## 2021-06-04 DIAGNOSIS — I129 Hypertensive chronic kidney disease with stage 1 through stage 4 chronic kidney disease, or unspecified chronic kidney disease: Secondary | ICD-10-CM

## 2021-06-04 DIAGNOSIS — E782 Mixed hyperlipidemia: Secondary | ICD-10-CM

## 2021-06-04 MED ORDER — CLOPIDOGREL BISULFATE 75 MG PO TABS: 75.0000 mg | ORAL_TABLET | Freq: Every morning | ORAL | 3 refills | Status: DC

## 2021-06-04 NOTE — Patient Instructions (Signed)
Visit Information  Thank you for taking time to visit with me today. Please don't hesitate to contact me if I can be of assistance to you before our next scheduled telephone appointment.  Following are the goals we discussed today:   Goals Addressed             This Visit's Progress    Pharmacy Goals       Our goal bad cholesterol, or LDL, is less than 70 . This is why it is important to continue taking your atorvastatin.  Please check your home blood pressure, keep a log of the results and bring this with you to your medical appointments.  Feel free to call me with any questions or concerns. I look forward to our next call!   Wallace Cullens, PharmD, [place] [phone]         Our next appointment is by telephone on 06/23/2021 at 9:15 AM  Please call the care guide team at [phone] if you need to cancel or reschedule your appointment.    The patient verbalized understanding of instructions, educational materials, and care plan provided today and declined offer to receive copy of patient instructions, educational materials, and care plan.

## 2021-06-04 NOTE — Chronic Care Management (AMB) (Signed)
Chronic Care Management CCM Pharmacy Note  06/04/2021 Name:  [name] MRN:  [id number] DOB:  1934-08-20   Subjective: [name] is an 87 y.o. year old male who is a primary patient of [name], DO.  The CCM team was consulted for assistance with disease management and care coordination needs.    Engaged with patient by telephone for follow up visit for pharmacy case management and/or care coordination services.   Objective:  Medications Reviewed Today     Reviewed by Rennis Petty, RPH-CPP (Pharmacist) on 06/04/21 at 10  Med List Status: <None>   Medication Order Taking? Sig Documenting Provider Last Dose Status Informant  aspirin EC 81 MG tablet [id number] Yes Take 1 tablet (81 mg total) by mouth in the morning. (blood thinner) [place], Devonne Doughty, DO Taking Active   atorvastatin (LIPITOR) 40 MG tablet [id number] Yes Take 1 tablet (40 mg total) by mouth at bedtime. (Cholesterol) [name], DO Taking Active   clopidogrel (PLAVIX) 75 MG tablet [id number] Yes Take 1 tablet (75 mg total) by mouth in the morning. (blood thinner) [place], Devonne Doughty, DO Taking Active   finasteride (PROSCAR) 5 MG tablet [id number] Yes Take 1 tablet (5 mg total) by mouth daily. [name], DO Taking Active   lisinopril (ZESTRIL) 20 MG tablet [id number] Yes Take 1 tablet (20 mg total) by mouth in the morning. (Blood pressure) Karamalegos, Devonne Doughty, DO Taking Active   Multiple Vitamins-Minerals (MULTIVITAMIN ADULTS 50+ PO) [id number] Yes Take by mouth. [provider] Taking Active   terazosin (HYTRIN) 5 MG capsule [id number] Yes Take 1 capsule (5 mg total) by mouth at bedtime. [name], DO Taking Active             Pertinent Labs:   Lab Results  Component Value Date   CHOL 163 06/13/2020   HDL 37 (L) 06/13/2020   LDLCALC 106 (H) 06/13/2020   TRIG 100 06/13/2020   CHOLHDL 4.4 06/13/2020   Lab Results   Component Value Date   CREATININE 1.08 05/12/2021   BUN 27 (H) 05/12/2021   NA 138 05/12/2021   K 4.6 05/12/2021   CL 103 05/12/2021   CO2 29 05/12/2021   BP Readings from Last 3 Encounters:  05/12/21 (!) 151/65  01/30/21 131/69  09/30/20 (!) 115/54   Pulse Readings from Last 3 Encounters:  05/12/21 86  01/30/21 83  09/30/20 72     SDOH:  (Social Determinants of Health) assessments and interventions performed:    [place]  Review of patient past medical history, allergies, medications, health status, including review of consultants reports, laboratory and other test data, was performed as part of comprehensive evaluation and provision of chronic care management services.   Care Plan : PharmD - Med Mgmt  Updates made by Rennis Petty, RPH-CPP since 06/04/2021 12:00 AM     Problem: Disease Progression      Long-Range Goal: Disease Progression Prevented or Minimized   Start Date: 07/03/2020  Expected End Date: 10/01/2020  Recent Progress: On track  Priority: High  Note:   Current Barriers:  Unable to self-administer medications as prescribed Chronic Disease Management support and education needs related to memory loss and changes Lack of blood pressure readings for clinical team  Pharmacist Clinical Goal(s):  Over the next 90 days, patient will achieve adherence to monitoring guidelines and medication adherence to achieve therapeutic efficacy through collaboration with PharmD and provider.   Interventions: 1:1 collaboration  with [name], DO regarding development and update of comprehensive plan of care as evidenced by provider attestation and co-signature Inter-disciplinary care team collaboration (see longitudinal plan of care) Receive a message from PCP requesting CCM Pharmacist outreach to patient to review medications as office has recently received multiple refill requests for this patient from [place] for various  medications, some of which are no longer on patient's medication list . Note CCM Pharmacist previously engaged with patient, but stopped following after patient declined need to schedule further outreach in 10/2020. Outreach to patient today by telephone. Patient agreeable to discuss and review medications with CCM Pharmacist today Perform chart review Patient seen for Office Visit with [place] on 05/12/2021 Per telephone note from [place] on 2/6, e-script for nitrofurantoin 100 mg twice daily for 7 days sent to pharmacy for patient to take for urinary tract infection. Also patient to return to office for recheck urinalysis and symptoms once completed antibiotic. Comprehensive medication review performed; medication list updated in electronic medical record Reports has just one more dose of nitrofurantoin antibiotic to take today to complete 7 day course as ordered by Urology Advise patient to call to schedule follow up appointment for recheck urinalysis and symptoms as directed by Urologist. Patient confirms has phone number and will call [facility] to schedule this appointment  Medication Adherence: Reports currently continues to take medication from pill packs as filled by [facility] (3 weeks of medication remaining from pill pack). However, reports has decided to switch prescriptions to [facility]/ [place] has received supplies of atorvastatin, finasteride, lisinopril and terazosin Rxs from [facility].  Confirms not taking medication from these bottles, rather has set these aside to use once out of medication from pill pack Patient requests Rx for clopidogrel also be sent to [place] Will follow up with PCP regarding this request Discuss importance of medication adherence and encourage patient to consider either resuming use of pill pack or using weekly  pillbox Patient states will obtain weekly pillboxes (one for AM and one for PM) and start filling and using this adherence tool from bottles once has used up current supply of medications from pill pack. Query patient's adherence to medications Note patient previously received assistance from friend [name] with managing medications, but denies need for assistance with medication management/adherence at this time.  Hypertension: Current treatment: Lisinopril 20 mg QAM Terazosin 5 mg QHS Reports had a home BP monitor, but states that it is no longer working Encouraged patient to consider obtaining new home upper arm BP monitor, checking home blood pressure, keeping log of results and bring record to medical appointments  Patient Goals/Self-Care Activities Over the next 90 days, patient will:  - focus on medication adherence by using weekly pillbox        Plan: Telephone follow up appointment with care management team member scheduled for:  06/23/2021 at [place], PharmD, Para [place], [place] [phone]

## 2021-06-05 ENCOUNTER — Telehealth: Payer: Self-pay | Admitting: Urology

## 2021-06-05 NOTE — Telephone Encounter (Signed)
Pt called in wanting an appt, but he was very confused as to what he needs to come for. He missed a 05/27/2021 appt and didn't know if he needs to reschedule that or not. He has to go to the main hospital on 2/28 for a heme workup. Informed him he may not get a call today, but tomorrow.

## 2021-06-05 NOTE — Telephone Encounter (Signed)
Called pt back informed him that the appt he cancelled was for a cysto which he still needs to have as the CT scan does not look inside the bladder. Pt voiced understanding. Pt scheduled.

## 2021-06-14 IMAGING — CR DG LUMBAR SPINE COMPLETE 4+V
5 series · 5 of 5 positions shown · non-contrast
Comparison: None.

CLINICAL DATA: Back and hip pain, tingling and needle [place]
sensation in right leg for 3-4 months

EXAM:
LUMBAR SPINE - COMPLETE 4+ VIEW

[l-spine ap]
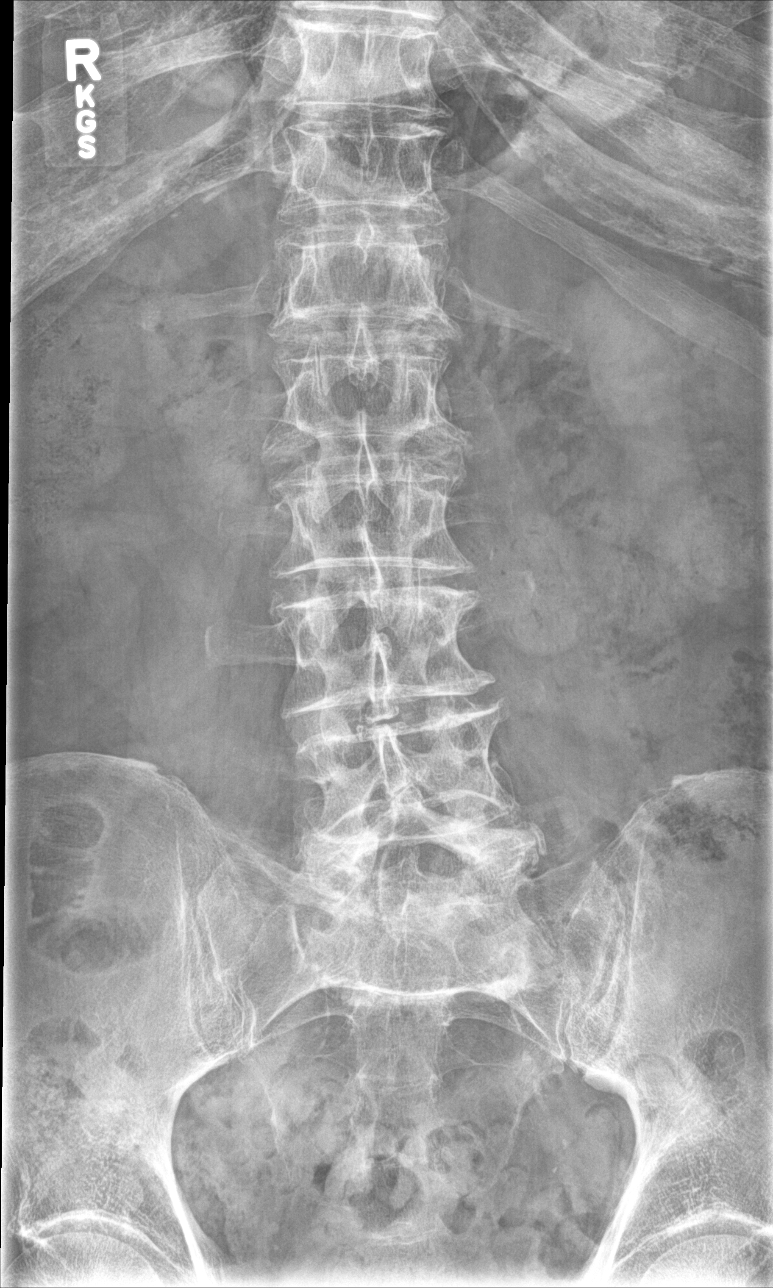

[l-spine obl (1 of 2)]
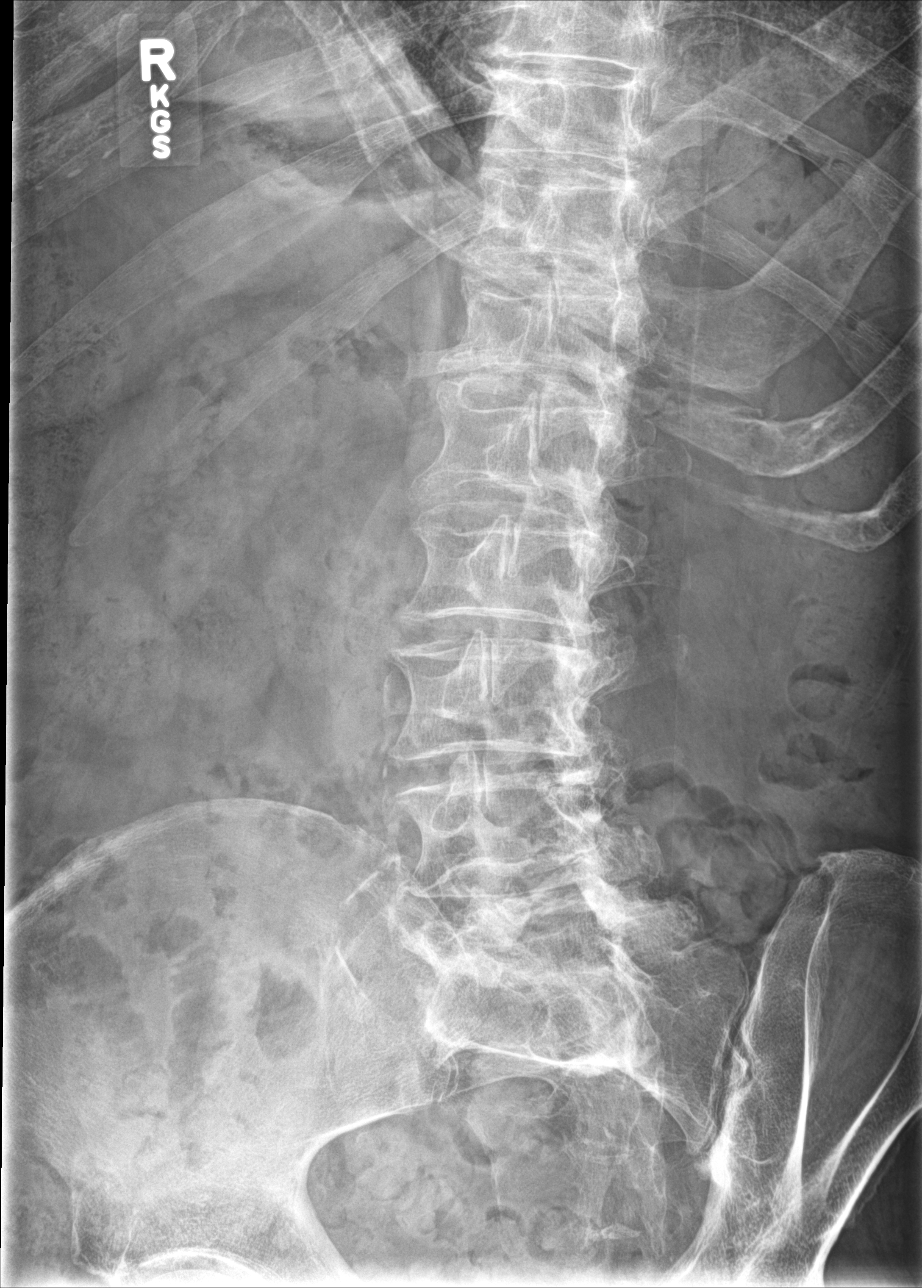

[l-spine obl (2 of 2)]
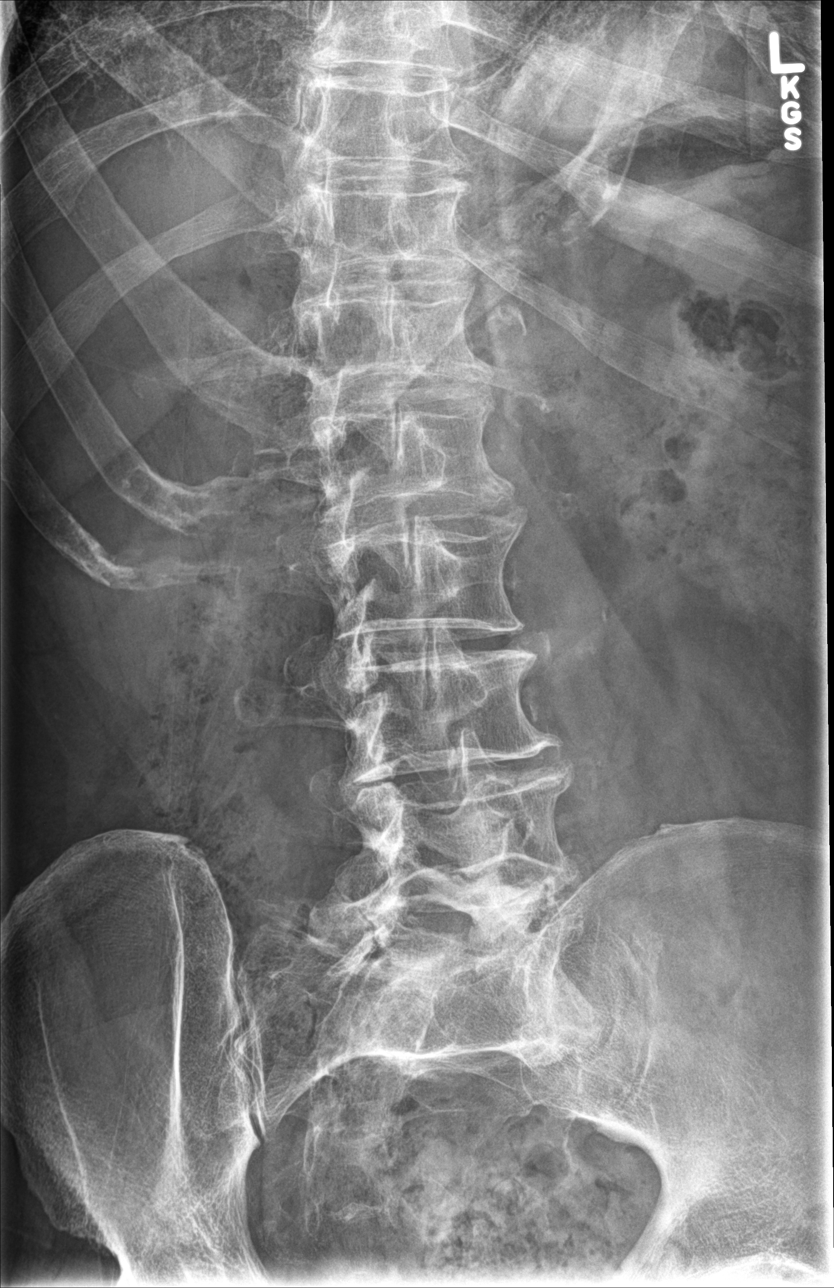

[l-spine lat]
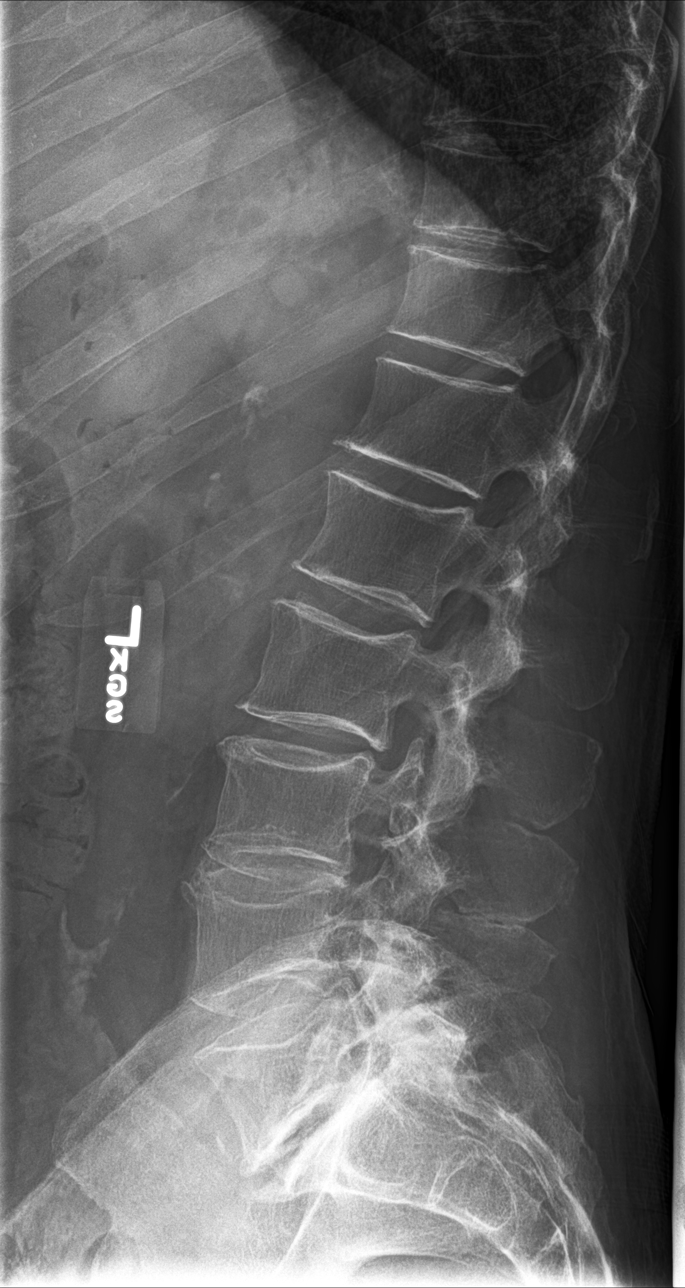

[l-spine spot]
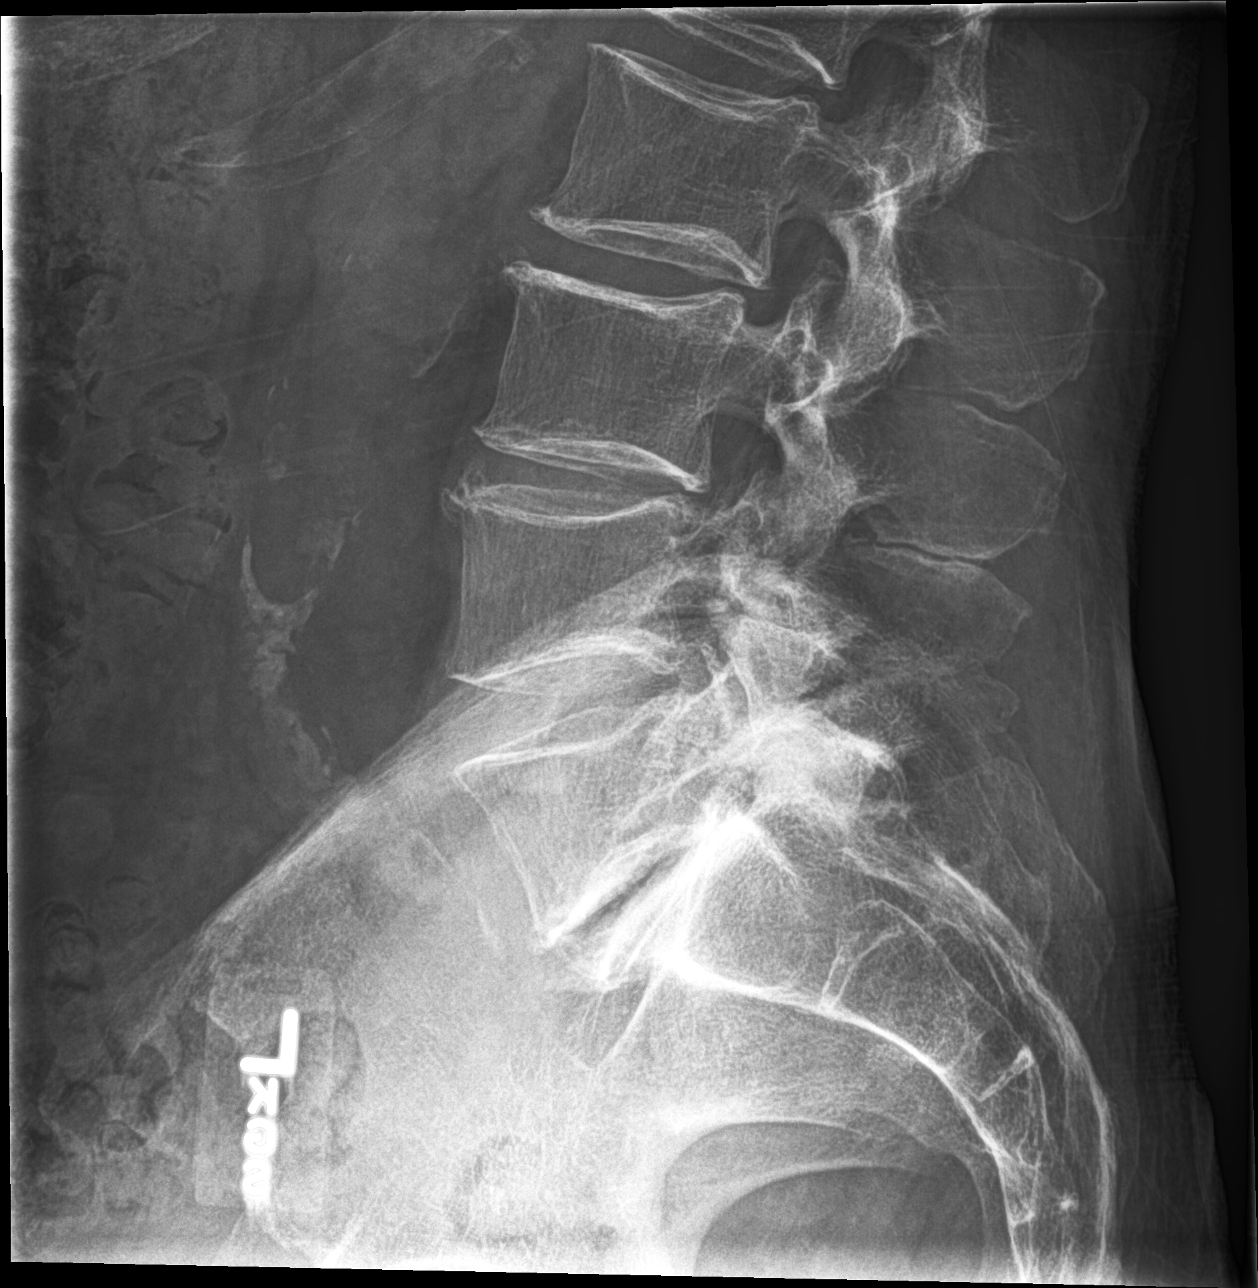

[5 of 5 positions shown; findings below may reference images not displayed]

FINDINGS: Osteopenia. No fracture or dislocation of the lumbar spine. Mild
multilevel disc space height loss and osteophytosis of the lower
lumbar levels, focally moderate at L5-S1. Mild to moderate facet
degenerative disease of the lower lumbar spine. Nonobstructive
pattern of overlying bowel gas.
IMPRESSION: No fracture or dislocation of the lumbar spine. Mild multilevel disc
space height loss and osteophytosis of the lower lumbar levels,
focally moderate at L5-S1. Mild to moderate facet degenerative
disease of the lower lumbar spine. Lumbar disc and neural foraminal
pathology may be further evaluated by MRI if indicated by
neurologically localizing signs and symptoms.

## 2021-06-17 ENCOUNTER — Ambulatory Visit: Admission: RE | Admit: 2021-06-17 | Payer: Medicare HMO | Source: Ambulatory Visit

## 2021-06-17 DIAGNOSIS — E782 Mixed hyperlipidemia: Secondary | ICD-10-CM

## 2021-06-17 DIAGNOSIS — N183 Chronic kidney disease, stage 3 unspecified: Secondary | ICD-10-CM

## 2021-06-17 DIAGNOSIS — I129 Hypertensive chronic kidney disease with stage 1 through stage 4 chronic kidney disease, or unspecified chronic kidney disease: Secondary | ICD-10-CM

## 2021-06-23 ENCOUNTER — Telehealth: Payer: Self-pay

## 2021-06-23 ENCOUNTER — Telehealth: Payer: Medicare HMO

## 2021-06-23 ENCOUNTER — Telehealth: Payer: Self-pay | Admitting: Pharmacist

## 2021-06-23 ENCOUNTER — Telehealth: Payer: Medicare Other

## 2021-06-23 NOTE — Telephone Encounter (Signed)
?  Care Management  ? ?Follow Up Note ? ? ?06/23/2021 ?Name: SHIMSHON NARULA MRN: [id number] DOB: 15-Jan-1935 ? ? ?Referred by: [name], DO ?Reason for referral : Chronic Care Management (RNCM: Follow up for Chronic Disease Management and Care Coordination Needs- attempt) ? ? ?An unsuccessful telephone outreach was attempted today. The patient was referred to the case management team for assistance with care management and care coordination.  ? ?Follow Up Plan: The care management team will reach out to the patient again over the next 30 to 60  days.  ? ?[name] RN, MSN, CCM ?Community Care Coordinator ?[facility] ?[facility] ?Mobile: [phone]  ?

## 2021-06-23 NOTE — Telephone Encounter (Signed)
?  Chronic Care Management  ? ?Outreach Note ? ?06/23/2021 ?Name: [name] MRN: [id number] DOB: 12-02-1934 ? ?Referred by: [name], DO ?Reason for referral : No chief complaint on file. ? ? ?Was unable to reach patient via telephone today x 2 and unable to leave a message as no voicemail picks up ? ? ?Follow Up Plan: CM Pharmacist will attempt to reach patient by telephone again within the next 2 weeks ? ?Wallace Cullens, PharmD, BCACP ?Clinical Pharmacist ?[place] [facility] ?[phone] ? ?

## 2021-06-24 ENCOUNTER — Other Ambulatory Visit: Payer: Medicare HMO | Admitting: Urology

## 2021-06-24 ENCOUNTER — Other Ambulatory Visit: Payer: Self-pay

## 2021-06-26 ENCOUNTER — Telehealth: Payer: Self-pay

## 2021-06-26 ENCOUNTER — Telehealth: Payer: Medicare HMO

## 2021-06-26 NOTE — Telephone Encounter (Signed)
?  Care Management  ? ?Follow Up Note ? ? ?06/26/2021 ?Name: [name] MRN: [id number] DOB: 29-Sep-1934 ? ? ?Referred by: [name], DO ?Reason for referral : Chronic Care Management (RNCM: Follow up for Chronic Disease Management and Care Coordination Needs ) ? ? ?A second unsuccessful telephone outreach was attempted today. The patient was referred to the case management team for assistance with care management and care coordination.  ? ?Follow Up Plan: The care management team will reach out to the patient again over the next 30 to 60  days.  ? ?[name] RN, MSN, CCM ?Community Care Coordinator ?[place] [facility] ?[facility] ?Mobile: [phone]  ? ?

## 2021-06-30 ENCOUNTER — Ambulatory Visit: Admission: RE | Admit: 2021-06-30 | Payer: Medicare HMO | Source: Ambulatory Visit

## 2021-07-02 ENCOUNTER — Ambulatory Visit (INDEPENDENT_AMBULATORY_CARE_PROVIDER_SITE_OTHER): Payer: Medicare HMO | Admitting: Pharmacist

## 2021-07-02 DIAGNOSIS — R413 Other amnesia: Secondary | ICD-10-CM

## 2021-07-02 DIAGNOSIS — I6930 Unspecified sequelae of cerebral infarction: Secondary | ICD-10-CM

## 2021-07-02 DIAGNOSIS — E782 Mixed hyperlipidemia: Secondary | ICD-10-CM

## 2021-07-02 DIAGNOSIS — I129 Hypertensive chronic kidney disease with stage 1 through stage 4 chronic kidney disease, or unspecified chronic kidney disease: Secondary | ICD-10-CM

## 2021-07-02 NOTE — Patient Instructions (Signed)
Visit Information ? ?Thank you for taking time to visit with me today. Please don't hesitate to contact me if I can be of assistance to you before our next scheduled telephone appointment. ? ?Following are the goals we discussed today:  ? Goals Addressed   ? ?  ?  ?  ?  ? This Visit's Progress  ?  Pharmacy Goals     ?  Our goal bad cholesterol, or LDL, is less than 70 . This is why it is important to continue taking your atorvastatin. ? ?Please check your home blood pressure, keep a log of the results and bring this with you to your medical appointments. ? ?Feel free to call me with any questions or concerns. I look forward to our next call! ? ? ? ?Wallace Cullens, PharmD, BCACP ?Clinical Pharmacist ?[facility] ?[place] ?[phone] ?  ? ?  ? ? ? ?Our next appointment is by telephone on 07/09/2021 at 3:15 PM ? ?Please call the care guide team at [phone] if you need to cancel or reschedule your appointment.  ? ? ?The patient verbalized understanding of instructions, educational materials, and care plan provided today and declined offer to receive copy of patient instructions, educational materials, and care plan.  ? ?

## 2021-07-02 NOTE — Chronic Care Management (AMB) (Signed)
? ?Chronic Care Management ?CCM Pharmacy Note ? ?07/02/2021 ?Name:  [name] MRN:  [id number] DOB:  [birth year] ? ? ?Subjective: ?[name] is an 87 y.o. year old male who is a primary patient of Jonathan Hauser, DO.  The CCM team was consulted for assistance with disease management and care coordination needs.   ? ?Engaged with patient and caregiver by telephone for follow up visit in response to provider referral for pharmacy case management and/or care coordination services.  ? ?Objective: ? ?Medications Reviewed Today   ? ? Reviewed by [name], [name] (Pharmacist) on 07/02/21 at [place] List Status: <None>  ? ?Medication Order Taking? Sig Documenting Provider Last Dose Status Informant  ?aspirin EC 81 MG tablet [id number]  Take 1 tablet (81 mg total) by mouth in the morning. (blood thinner) [place], Devonne Doughty, DO  Active   ?atorvastatin (LIPITOR) 40 MG tablet [id number] No Take 1 tablet (40 mg total) by mouth at bedtime. (Cholesterol)  ?Patient not taking: Reported on 07/02/2021  ? Jonathan Hauser, DO Not Taking Active   ?clopidogrel (PLAVIX) 75 MG tablet [id number] No Take 1 tablet (75 mg total) by mouth in the morning. (blood thinner)  ?Patient not taking: Reported on 07/02/2021  ? Jonathan Hauser, DO Not Taking Active   ?finasteride (PROSCAR) 5 MG tablet [id number] No Take 1 tablet (5 mg total) by mouth daily.  ?Patient not taking: Reported on 07/02/2021  ? Jonathan Hauser, DO Not Taking Active   ?lisinopril (ZESTRIL) 20 MG tablet [id number] No Take 1 tablet (20 mg total) by mouth in the morning. (Blood pressure)  ?Patient not taking: Reported on 07/02/2021  ? Jonathan Hauser, DO Not Taking Active   ?Multiple Vitamins-Minerals (MULTIVITAMIN ADULTS 50+ PO) [id number]  Take by mouth. [provider]  Active   ?terazosin (HYTRIN) 5 MG capsule [id number] No Take 1 capsule (5 mg total) by mouth at bedtime.  ?Patient not taking: Reported on  07/02/2021  ? Jonathan Hauser, DO Not Taking Active   ? ?  ?  ? ?  ? ? ?Pertinent Labs:  ? ?Lab Results  ?Component Value Date  ? CHOL 163 06/13/2020  ? HDL 37 (L) 06/13/2020  ? LDLCALC 106 (H) 06/13/2020  ? TRIG 100 06/13/2020  ? CHOLHDL 4.4 06/13/2020  ? ?Lab Results  ?Component Value Date  ? CREATININE 1.08 05/12/2021  ? BUN 27 (H) 05/12/2021  ? NA 138 05/12/2021  ? K 4.6 05/12/2021  ? CL 103 05/12/2021  ? CO2 29 05/12/2021  ? ? ?SDOH:  (Social Determinants of Health) assessments and interventions performed:  ? ? ?CCM Care Plan ? ?Review of patient past medical history, allergies, medications, health status, including review of consultants reports, laboratory and other test data, was performed as part of comprehensive evaluation and provision of chronic care management services.  ? ?Care Plan : PharmD - Med Mgmt  ?Updates made by [name], [name] since 07/02/2021 12:00 AM  ?  ? ?Problem: Disease Progression   ?  ? ?Long-Range Goal: Disease Progression Prevented or Minimized   ?Start Date: 07/03/2020  ?Expected End Date: 10/01/2020  ?Recent Progress: On track  ?Priority: High  ?Note:   ?Current Barriers:  ?Unable to self-administer medications as prescribed ?Chronic Disease Management support and education needs related to memory loss and changes ?Lack of blood pressure readings for clinical team ?Inconsistent engagement with care team/missed appointments ? ?Pharmacist Clinical Goal(s):  ?Over the next 90  days, patient will achieve adherence to monitoring guidelines and medication adherence to achieve therapeutic efficacy through collaboration with PharmD and provider.  ? ?Interventions: ?1:1 collaboration with Jonathan Hauser, DO regarding development and update of comprehensive plan of care as evidenced by provider attestation and co-signature ?Inter-disciplinary care team collaboration (see longitudinal plan of care) ?Perform chart review. Note patient missed appointment with Urology  on 3/7 ?Today reach both patient and friend/caregiver, [name], by phone ?Advise patient to call to reschedule missed appointment with [place]  ?Provide phone number to both patient and caregiver ? ?Medication Adherence: ?Patient reports he has not been taking his medications recently ?Discuss importance of medication adherence ?Reports needing assistance with managing his medications. Patient interested in my assistance with reaching out to his friend [name] for help ?Note patient recently switched his medications from pill packaging at [facility] to mail order pharmacy ?Today patient willing to consider going back to pill packaging for adherence aid ?Reach patient's friend [name] by phone today.  ?[name] states he will go by patient's home tomorrow to setup/support patient with using weekly pillbox for now and then also look into getting patient setup for pill packaging again ?Confirms patient has weekly pillboxes (one for AM and one for PM)  ?Encourage friend/caregiver to review patient's pillbox in between refill to confirm adherence ? ? ?Patient Goals/Self-Care Activities ?Over the next 90 days, patient will:  ?- focus on medication adherence by using weekly pillbox ? ? ?  ?  ? ?Plan: Telephone follow up appointment with care management team member scheduled for:  07/09/2021 at 3:15 PM ? ?Wallace Cullens, PharmD, BCACP, CPP ?Clinical Pharmacist ?[facility] ?[place] ?[phone] ? ? ? ? ? ?

## 2021-07-09 ENCOUNTER — Ambulatory Visit: Payer: Medicare HMO | Admitting: Pharmacist

## 2021-07-09 DIAGNOSIS — I6930 Unspecified sequelae of cerebral infarction: Secondary | ICD-10-CM

## 2021-07-09 DIAGNOSIS — N183 Chronic kidney disease, stage 3 unspecified: Secondary | ICD-10-CM

## 2021-07-09 NOTE — Patient Instructions (Signed)
Visit Information ? ?Thank you for taking time to visit with me today. Please don't hesitate to contact me if I can be of assistance to you before our next scheduled telephone appointment. ? ?Following are the goals we discussed today:  ? Goals Addressed   ? ?  ?  ?  ?  ? This Visit's Progress  ?  Pharmacy Goals     ?  Our goal bad cholesterol, or LDL, is less than 70 . This is why it is important to continue taking your atorvastatin. ? ?Please check your home blood pressure, keep a log of the results and bring this with you to your medical appointments. ? ?Feel free to call me with any questions or concerns. I look forward to our next call! ? ? ?Wallace Cullens, PharmD, BCACP ?Clinical Pharmacist ?[facility] ?[place] ?[phone] ?  ? ?  ? ? ? ?Our next appointment is by telephone on 08/13/2021 1:45 PM ? ?Please call the care guide team at [phone] if you need to cancel or reschedule your appointment.  ? ? ?Caregiver verbalized understanding of instructions, educational materials, and care plan provided today and declined offer to receive copy of patient instructions, educational materials, and care plan.  ? ?

## 2021-07-09 NOTE — Chronic Care Management (AMB) (Signed)
? ?Chronic Care Management ?CCM Pharmacy Note ? ?07/09/2021 ?Name:  [name] MRN:  [id number] DOB:  [birth year] ? ? ?Subjective: ?[name] is an 87 y.o. year old male who is a primary patient of [name], DO.  The CCM team was consulted for assistance with disease management and care coordination needs.   ? ?Engaged with patient by telephone for follow up visit for pharmacy case management and/or care coordination services.  ? ?Objective: ? ?Medications Reviewed Today   ? ? Reviewed by Rennis Petty, RPH-CPP (Pharmacist) on 07/09/21 at 1330  Med List Status: <None>  ? ?Medication Order Taking? Sig Documenting Provider Last Dose Status Informant  ?aspirin EC 81 MG tablet [id number] Yes Take 1 tablet (81 mg total) by mouth in the morning. (blood thinner) Parks Ranger, Devonne Doughty, DO Taking Active   ?atorvastatin (LIPITOR) 40 MG tablet [id number] Yes Take 1 tablet (40 mg total) by mouth at bedtime. (Cholesterol) [name], DO Taking Active   ?clopidogrel (PLAVIX) 75 MG tablet [id number] Yes Take 1 tablet (75 mg total) by mouth in the morning. (blood thinner) Parks Ranger, Devonne Doughty, DO Taking Active   ?finasteride (PROSCAR) 5 MG tablet [id number] Yes Take 1 tablet (5 mg total) by mouth daily. [name], DO Taking Active   ?lisinopril (ZESTRIL) 20 MG tablet [id number] Yes Take 1 tablet (20 mg total) by mouth in the morning. (Blood pressure) Parks Ranger, Devonne Doughty, DO Taking Active   ?Multiple Vitamins-Minerals (MULTIVITAMIN ADULTS 50+ PO) [id number] Yes Take by mouth. [provider] Taking Active   ?Omega-3 1000 MG CAPS [id number] Yes Take 1 capsule by mouth in the morning. [provider] Taking Active   ?terazosin (HYTRIN) 5 MG capsule [id number] Yes Take 1 capsule (5 mg total) by mouth at bedtime. [name], DO Taking Active   ? ?  ?  ? ?  ? ? ?Pertinent Labs:  ? ?Lab Results  ?Component Value Date  ? CHOL 163 06/13/2020  ? HDL 37  (L) 06/13/2020  ? LDLCALC 106 (H) 06/13/2020  ? TRIG 100 06/13/2020  ? CHOLHDL 4.4 06/13/2020  ? ?Lab Results  ?Component Value Date  ? CREATININE 1.08 05/12/2021  ? BUN 27 (H) 05/12/2021  ? NA 138 05/12/2021  ? K 4.6 05/12/2021  ? CL 103 05/12/2021  ? CO2 29 05/12/2021  ? ? ?SDOH:  (Social Determinants of Health) assessments and interventions performed:  ? ? ?CCM Care Plan ? ?Review of patient past medical history, allergies, medications, health status, including review of consultants reports, laboratory and other test data, was performed as part of comprehensive evaluation and provision of chronic care management services.  ? ?Care Plan : PharmD - Med Mgmt  ?Updates made by Rennis Petty, RPH-CPP since 07/09/2021 12:00 AM  ?  ? ?Problem: Disease Progression   ?  ? ?Long-Range Goal: Disease Progression Prevented or Minimized   ?Start Date: 07/03/2020  ?Expected End Date: 10/01/2020  ?Recent Progress: On track  ?Priority: High  ?Note:   ?Current Barriers:  ?Unable to self-administer medications as prescribed ?Chronic Disease Management support and education needs related to memory loss and changes ?Lack of blood pressure readings for clinical team ?Inconsistent engagement with care team/missed appointments ? ?Pharmacist Clinical Goal(s):  ?Over the next 90 days, patient will achieve adherence to monitoring guidelines and medication adherence to achieve therapeutic efficacy through collaboration with PharmD and provider.  ? ?Interventions: ?1:1 collaboration with [name], DO regarding development and update of  comprehensive plan of care as evidenced by provider attestation and co-signature ?Inter-disciplinary care team collaboration (see longitudinal plan of care) ?Today unable to reach patient by telephone. However, reach friend/caregiver, [name], by phone ?Again recommend patient/caregiver call to reschedule missed appointment with [facility]  ? ?Medication  Adherence: ?Today caregiver reports he is filling weekly pillboxes for patient (one for AM and one for PM) as well as checks behind patient a couple of times/week to make sure taking medications consistently ?Reports last checked in on patient this morning and denies him missing doses ?Remind caregiver to watch supply of medications and to order refills in time to arrive from mail order ?Have discussed option of returning to using a pill packaging pharmacy ? ?Patient Goals/Self-Care Activities ?Over the next 90 days, patient will:  ?- focus on medication adherence by using weekly pillbox ?- attend medical appointment as scheduled ? Remind caregiver of patient's upcoming appointment with PCP on 4/18 ? ? ?  ?  ? ?Plan: The care management team will reach out to the patient again over the next 60 days. ? ?Wallace Cullens, PharmD, BCACP, CPP ?Clinical Pharmacist ?[facility] ?[place] ?[phone] ? ? ? ? ? ?

## 2021-07-10 ENCOUNTER — Ambulatory Visit: Payer: Self-pay

## 2021-07-10 ENCOUNTER — Telehealth: Payer: Self-pay

## 2021-07-10 ENCOUNTER — Telehealth: Payer: Medicare HMO

## 2021-07-10 DIAGNOSIS — N183 Chronic kidney disease, stage 3 unspecified: Secondary | ICD-10-CM

## 2021-07-10 DIAGNOSIS — I69319 Unspecified symptoms and signs involving cognitive functions following cerebral infarction: Secondary | ICD-10-CM

## 2021-07-10 DIAGNOSIS — N138 Other obstructive and reflux uropathy: Secondary | ICD-10-CM

## 2021-07-10 DIAGNOSIS — I129 Hypertensive chronic kidney disease with stage 1 through stage 4 chronic kidney disease, or unspecified chronic kidney disease: Secondary | ICD-10-CM

## 2021-07-10 DIAGNOSIS — R413 Other amnesia: Secondary | ICD-10-CM

## 2021-07-10 DIAGNOSIS — I10 Essential (primary) hypertension: Secondary | ICD-10-CM

## 2021-07-10 DIAGNOSIS — Z9181 History of falling: Secondary | ICD-10-CM

## 2021-07-10 DIAGNOSIS — E782 Mixed hyperlipidemia: Secondary | ICD-10-CM

## 2021-07-10 NOTE — Telephone Encounter (Signed)
?  Care Management  ? ?Follow Up Note ? ? ?07/10/2021 ?Name: DARCEY DEMMA MRN: [id number] DOB: 04-19-1935 ? ? ?Referred by: [name], DO ?Reason for referral : Chronic Care Management (RNCM: Follow up for Chronic Disease Management and Care Coordination Needs ) ? ? ?Call made to the patients friend and DRP [name]. Call completed. See new encounter.  ? ?Follow Up Plan: Face to Face appointment with care management team member scheduled for:  08-11-2021 at 42 am ? ?[name] RN, MSN, CCM ?Community Care Coordinator ?[facility] ?[facility] ?Mobile: [phone]  ?

## 2021-07-10 NOTE — Chronic Care Management (AMB) (Signed)
?Chronic Care Management  ? ?CCM RN Visit Note ? ?07/10/2021 ?Name: [name] MRN: [id number] DOB: 08/07/1934 ? ?Subjective: ?[name] is a 87 y.o. year old male who is a primary care patient of Jonathan Hauser, DO. The care management team was consulted for assistance with disease management and care coordination needs.   ? ?Engaged with patient by telephone for follow up visit in response to provider referral for case management and/or care coordination services.  ? ?Consent to Services:  ?The patient was given information about Chronic Care Management services, agreed to services, and gave verbal consent prior to initiation of services.  Please see initial visit note for detailed documentation.  ? ?Patient agreed to services and verbal consent obtained.  ? ?Assessment: Review of patient past medical history, allergies, medications, health status, including review of consultants reports, laboratory and other test data, was performed as part of comprehensive evaluation and provision of chronic care management services.  ? ?SDOH (Social Determinants of Health) assessments and interventions performed:   ? ?CCM Care Plan ? ?No Known Allergies ? ?Outpatient Encounter Medications as of 07/10/2021  ?Medication Sig  ? aspirin EC 81 MG tablet Take 1 tablet (81 mg total) by mouth in the morning. (blood thinner)  ? atorvastatin (LIPITOR) 40 MG tablet Take 1 tablet (40 mg total) by mouth at bedtime. (Cholesterol)  ? clopidogrel (PLAVIX) 75 MG tablet Take 1 tablet (75 mg total) by mouth in the morning. (blood thinner)  ? finasteride (PROSCAR) 5 MG tablet Take 1 tablet (5 mg total) by mouth daily.  ? lisinopril (ZESTRIL) 20 MG tablet Take 1 tablet (20 mg total) by mouth in the morning. (Blood pressure)  ? Multiple Vitamins-Minerals (MULTIVITAMIN ADULTS 50+ PO) Take by mouth.  ? Omega-3 1000 MG CAPS Take 1 capsule by mouth in the morning.  ? terazosin (HYTRIN) 5 MG capsule Take 1 capsule (5 mg total) by mouth at  bedtime.  ? ?No facility-administered encounter medications on file as of 07/10/2021.  ? ? ?Patient Active Problem List  ? Diagnosis Date Noted  ? Cognitive deficit as late effect of cerebrovascular accident (CVA) 06/25/2020  ? Essential hypertension 06/25/2020  ? Imbalance 02/01/2018  ? Humeral head fracture, left, closed, initial encounter 09/22/2017  ? History of cerebrovascular accident (CVA) with residual deficit 09/09/2017  ? CKD (chronic kidney disease), stage III ([place]) 09/01/2017  ? Elevated PSA, between 10 and less than 20 ng/ml 07/22/2016  ? Bilateral lower extremity edema 07/22/2016  ? Abnormal glucose 07/15/2016  ? BPH with obstruction/lower urinary tract symptoms 04/10/2016  ? Benign hypertension with CKD (chronic kidney disease) stage III ([place]) 04/10/2016  ? Memory loss 04/10/2016  ? Gastric outlet obstruction 12/02/2014  ? Generalized abdominal pain   ? Benign fibroma of prostate 01/02/2011  ? HLD (hyperlipidemia) 01/02/2011  ? Obesity (BMI 30.0-34.9) 01/02/2011  ? ? ?Conditions to be addressed/monitored:HTN, HLD, CKD Stage 3, BPH, and memory loss and falls ? ?Care Plan : RNCM: General Plan of Care (Adult) for Chronic Disease Management and Care Coordination Needs  ?Updates made by Jonathan Ingles, RN since 07/10/2021 12:00 AM  ?  ? ?Problem: RNCM: Development of Plan of Care for Chronic Disease Management (HTN, HLD, Memory loss, Falls, CKD3, BPH)   ?Priority: High  ?  ? ?Long-Range Goal: RNCM: Effective Management  of Plan of Care for Chronic Disease Management (HTN, HLD, Memory loss, Falls, CKD3, BPH)   ?Start Date: 04/24/2021  ?Expected End Date: 04/24/2022  ?Priority: High  ?  Note:   ?Current Barriers:  ?Knowledge Deficits related to plan of care for management of HTN, HLD, CKD Stage 3, and Memory loss, BPH  ?Care Coordination needs related to Mental Health Concerns   ?Chronic Disease Management support and education needs related to HTN, HLD, CKD Stage 3, and Memory loss, BPH ?Cognitive Deficits ? ?RNCM  Clinical Goal(s):  ?Patient will verbalize basic understanding of HTN, HLD, CKD Stage 3, BPH, and Memory Loss disease process and self health management plan as evidenced by keeping appointments, taking medications, following diet, and working with the CCM team to optimize health and well being. ?take all medications exactly as prescribed and will call provider for medication related questions as evidenced by compliance with medications and calling for refills before running out of medications    ?attend all scheduled medical appointments: 08-11-2021 at 1040am as evidenced by keeping appointments and calling for schedule change needs. The patient ask the RNCM to assist him with getting an appointment to see urologist for follow up.        ?demonstrate improved and ongoing adherence to prescribed treatment plan for HTN, HLD, CKD Stage 3, BPH, and memory loss and falls as evidenced by stable conditions, stable VS, no acute changes in condition, and seeing providers on a regular basis. ?demonstrate a decrease in HTN, HLD, CKD Stage 3, BPH, and memory loss, and falls exacerbations  as evidenced by effective management of chronic disease management through collaboration with RN Care manager, provider, and care team.  ? ?Interventions: ?1:1 collaboration with primary care provider regarding development and update of comprehensive plan of care as evidenced by provider attestation and co-signature ?Inter-disciplinary care team collaboration (see longitudinal plan of care) ?Evaluation of current treatment plan related to  self management and patient's adherence to plan as established by provider ? ? ?Chronic Kidney Disease/BPH (Status: Goal on Track (progressing): YES.)  Long Term Goal  ?Last practice recorded BP readings:  ?BP Readings from Last 3 Encounters:  ?05/12/21 (!) 151/65  ?01/30/21 131/69  ?09/30/20 (!) 115/54  ?Most recent eGFR/CrCl: No results found for: EGFR  No components found for: CRCL ? ?Assessed the patient  understanding of chronic kidney disease    ?Evaluation of current treatment plan related to chronic kidney disease self management and patient's adherence to plan as established by provider. 04-24-2021: The patient states that he is doing well with everything but when he "pees" it is just a dribble. He has had this concern over a long period of time but states that it is getting worse. RNCM assisted the patient with calling [place] at [phone]. Had to leave a detailed message and ask them to call the patient with an appointment, also left RNCM information for assistance. Sent in basket message to pcp for further recommendations. Will follow up with the patient for appointment information.  07-10-2021: The RNCM has been unable to get in touch with the patient but was able to get in touch with the patients friend and DRP, [name] [name] is now helping the patient with his appointments and medications management. He told the patient he had to take his medications or he would end up having another stroke or something happen and he have to go back and stay at the nursing home. The patient does not want this. [name] is checking in every few days with the patient. He is also writing down his appointments. Education and support given.     ?Provided education to patient re: stroke prevention, s/s  of heart attack and stroke    ?Reviewed prescribed diet heart healthy diet ?Reviewed medications with patient and discussed importance of compliance. 04-24-2021: The patient reviewed the medications with the [facility] and states he is taking as directed. 07-10-2021: The patient had stopped taking his medications again. [name] his friend is not filling up his pill box for 5 days and he goes and checks every few days. He got new pill boxes with am and pm on them and has instructed the patient what to do. The [name] is also working with the patient and [name] to help with effective management of  medications.     ?Advised patient, providing education and rationale, to monitor blood pressure daily and record, calling PCP for findings outside established parameters    ?Discussed complications of poorly cont

## 2021-07-10 NOTE — Patient Instructions (Signed)
Visit Information ? ?Thank you for taking time to visit with me today. Please don't hesitate to contact me if I can be of assistance to you before our next scheduled telephone appointment. ? ?Following are the goals we discussed today:  ?RNCM Clinical Goal(s):  ?Patient will verbalize basic understanding of HTN, HLD, CKD Stage 3, BPH, and Memory Loss disease process and self health management plan as evidenced by keeping appointments, taking medications, following diet, and working with the CCM team to optimize health and well being. ?take all medications exactly as prescribed and will call provider for medication related questions as evidenced by compliance with medications and calling for refills before running out of medications    ?attend all scheduled medical appointments: 08-11-2021 at 1040am as evidenced by keeping appointments and calling for schedule change needs. The patient ask the RNCM to assist him with getting an appointment to see urologist for follow up.        ?demonstrate improved and ongoing adherence to prescribed treatment plan for HTN, HLD, CKD Stage 3, BPH, and memory loss and falls as evidenced by stable conditions, stable VS, no acute changes in condition, and seeing providers on a regular basis. ?demonstrate a decrease in HTN, HLD, CKD Stage 3, BPH, and memory loss, and falls exacerbations  as evidenced by effective management of chronic disease management through collaboration with RN Care manager, provider, and care team.  ?  ?Interventions: ?1:1 collaboration with primary care provider regarding development and update of comprehensive plan of care as evidenced by provider attestation and co-signature ?Inter-disciplinary care team collaboration (see longitudinal plan of care) ?Evaluation of current treatment plan related to  self management and patient's adherence to plan as established by provider ?  ?  ?Chronic Kidney Disease/BPH (Status: Goal on Track (progressing): YES.)  Long Term  Goal  ?Last practice recorded BP readings:  ?   ?BP Readings from Last 3 Encounters:  ?05/12/21 (!) 151/65  ?01/30/21 131/69  ?09/30/20 (!) 115/54  ?Most recent eGFR/CrCl: No results found for: EGFR  No components found for: CRCL ?  ?Assessed the patient understanding of chronic kidney disease    ?Evaluation of current treatment plan related to chronic kidney disease self management and patient's adherence to plan as established by provider. 04-24-2021: The patient states that he is doing well with everything but when he "pees" it is just a dribble. He has had this concern over a long period of time but states that it is getting worse. RNCM assisted the patient with calling [facility] at [phone]. Had to leave a detailed message and ask them to call the patient with an appointment, also left RNCM information for assistance. Sent in basket message to pcp for further recommendations. Will follow up with the patient for appointment information.  07-10-2021: The RNCM has been unable to get in touch with the patient but was able to get in touch with the patients friend and DRP, [name]. [name] is now helping the patient with his appointments and medications management. He told the patient he had to take his medications or he would end up having another stroke or something happen and he have to go back and stay at the nursing home. The patient does not want this. [name] is checking in every few days with the patient. He is also writing down his appointments. Education and support given.     ?Provided education to patient re: stroke prevention, s/s of heart attack and stroke    ?Reviewed  prescribed diet heart healthy diet ?Reviewed medications with patient and discussed importance of compliance. 04-24-2021: The patient reviewed the medications with the [facility] and states he is taking as directed. 07-10-2021: The patient had stopped taking his medications again. [name] his friend is not filling up  his pill box for 5 days and he goes and checks every few days. He got new pill boxes with am and pm on them and has instructed the patient what to do. The pharm D is also working with the patient and [name] to help with effective management of medications.     ?Advised patient, providing education and rationale, to monitor blood pressure daily and record, calling PCP for findings outside established parameters    ?Discussed complications of poorly controlled blood pressure such as heart disease, stroke, circulatory complications, vision complications, kidney impairment, sexual dysfunction    ?Reviewed scheduled/upcoming provider appointments including: 04-24-2021: Called and left a message with [place] asking for a call back to the patient with an appointment for follow up due to urinary retention and patient stating that it is getting worse.    ?Advised patient to discuss urinary retention, BPH and not being able to void properly with provider. 3-23-2023[name] given the number to [facility] so he could call them and reschedule the patients appointment.    ?Discussed plans with patient for ongoing care management follow up and provided patient with direct contact information for care management team    ?Screening for signs and symptoms of depression related to chronic disease state      ?Discussed the impact of chronic kidney disease on daily life and mental health and acknowledged and normalized feelings of disempowerment, fear, and frustration    ?Assessed social determinant of health barriers    ?Provided education on kidney disease progression    ?Engage patient in early, proactive and ongoing discussion about goals of care and what matters most to them    ?Support coping and stress management by recognizing current strategies and assist in developing new strategies such as mindfulness, journaling, relaxation techniques, problem-solving    ?  ?  ?Falls:  (Status:  Goal on Track (progressing): YES.) Long Term Goal  ?Provided written and verbal education re: potential causes of falls and Fall prevention strategies ?Reviewed medications and discussed potential side effects of medications such as dizziness and frequent urination. 07-10-2021: Does take medications for urinary retention but is not always compliant  ?Advised patient of importance of notifying provider of falls ?Assessed for signs and symptoms of orthostatic hypotension ?Assessed for falls since last encounter. 04-24-2021: Denies any new falls since last outreach. 07-10-2021: [name] denies any new falls with the patient.  ?Assessed patients knowledge of fall risk prevention secondary to previously provided education. 07-10-2021: The patient is at high fall risk.  ?Provided patient information for fall alert systems ?Screening for signs and symptoms of depression related to chronic disease state ?Assessed social determinant of health barriers ?  ?Memory Loss  (Status: Goal on Track (progressing): YES.) Long Term Goal  ?Evaluation of current treatment plan related to  Memory loss , Memory Deficits self-management and patient's adherence to plan as established by provider. 07-10-2021: The patient continues to have memory concerns. The patients friend has come back in to help the patient with his medications and getting his MD appointments straight. The patient had not been taking his medications and had missed several appointments. [name] is helping the patient and will continue to monitor at this time.  ?  Discussed plans with patient for ongoing care management follow up and provided patient with direct contact information for care management team ?Advised patient to call the office for changes in memory, anxiety, depression; ?Provided education to patient re: writing down appointments, keeping appointments, and an effective way to make sure the patient takes medications as directed.; ?Reviewed scheduled/upcoming provider  appointments including 08-11-2021 at 1040 am. Reminder given today to [name] that the patient had an upcoming appointment with the pcp. He has a calendar with this this information written down.  ?Discuss

## 2021-07-18 DIAGNOSIS — E785 Hyperlipidemia, unspecified: Secondary | ICD-10-CM

## 2021-07-18 DIAGNOSIS — N183 Chronic kidney disease, stage 3 unspecified: Secondary | ICD-10-CM

## 2021-07-18 DIAGNOSIS — N400 Enlarged prostate without lower urinary tract symptoms: Secondary | ICD-10-CM | POA: Diagnosis not present

## 2021-07-18 DIAGNOSIS — I129 Hypertensive chronic kidney disease with stage 1 through stage 4 chronic kidney disease, or unspecified chronic kidney disease: Secondary | ICD-10-CM | POA: Diagnosis not present

## 2021-08-05 ENCOUNTER — Ambulatory Visit: Payer: Medicare Other | Admitting: Family Medicine

## 2021-08-11 ENCOUNTER — Ambulatory Visit (INDEPENDENT_AMBULATORY_CARE_PROVIDER_SITE_OTHER): Payer: Medicare HMO | Admitting: Family Medicine

## 2021-08-11 ENCOUNTER — Other Ambulatory Visit: Payer: Self-pay | Admitting: Urology

## 2021-08-11 ENCOUNTER — Ambulatory Visit (INDEPENDENT_AMBULATORY_CARE_PROVIDER_SITE_OTHER): Payer: Medicare HMO

## 2021-08-11 ENCOUNTER — Encounter: Payer: Self-pay | Admitting: Family Medicine

## 2021-08-11 ENCOUNTER — Telehealth: Payer: Medicare HMO

## 2021-08-11 ENCOUNTER — Ambulatory Visit: Payer: Medicare HMO

## 2021-08-11 VITALS — BP 132/70 | HR 66 | Ht 70.0 in | Wt 234.4 lb

## 2021-08-11 DIAGNOSIS — I69319 Unspecified symptoms and signs involving cognitive functions following cerebral infarction: Secondary | ICD-10-CM

## 2021-08-11 DIAGNOSIS — N183 Chronic kidney disease, stage 3 unspecified: Secondary | ICD-10-CM

## 2021-08-11 DIAGNOSIS — R413 Other amnesia: Secondary | ICD-10-CM | POA: Diagnosis not present

## 2021-08-11 DIAGNOSIS — N401 Enlarged prostate with lower urinary tract symptoms: Secondary | ICD-10-CM

## 2021-08-11 DIAGNOSIS — I129 Hypertensive chronic kidney disease with stage 1 through stage 4 chronic kidney disease, or unspecified chronic kidney disease: Secondary | ICD-10-CM

## 2021-08-11 DIAGNOSIS — E782 Mixed hyperlipidemia: Secondary | ICD-10-CM

## 2021-08-11 DIAGNOSIS — Z9181 History of falling: Secondary | ICD-10-CM

## 2021-08-11 DIAGNOSIS — N138 Other obstructive and reflux uropathy: Secondary | ICD-10-CM

## 2021-08-11 DIAGNOSIS — I10 Essential (primary) hypertension: Secondary | ICD-10-CM

## 2021-08-11 DIAGNOSIS — I6930 Unspecified sequelae of cerebral infarction: Secondary | ICD-10-CM

## 2021-08-11 DIAGNOSIS — Z23 Encounter for immunization: Secondary | ICD-10-CM

## 2021-08-11 DIAGNOSIS — R319 Hematuria, unspecified: Secondary | ICD-10-CM

## 2021-08-11 DIAGNOSIS — R7309 Other abnormal glucose: Secondary | ICD-10-CM | POA: Diagnosis not present

## 2021-08-11 NOTE — Chronic Care Management (AMB) (Signed)
?Chronic Care Management  ? ?CCM RN Visit Note ? ?08/11/2021 ?Name: [name] MRN: [id number] DOB: 1935/01/25 ? ?Subjective: ?[name] is a 87 y.o. year old male who is a primary care patient of [name], DO. The care management team was consulted for assistance with disease management and care coordination needs.   ? ?Engaged with patient face to face for follow up visit in response to provider referral for case management and/or care coordination services.  ? ?Consent to Services:  ?The patient was given information about Chronic Care Management services, agreed to services, and gave verbal consent prior to initiation of services.  Please see initial visit note for detailed documentation.  ? ?Patient agreed to services and verbal consent obtained.  ? ?Assessment: Review of patient past medical history, allergies, medications, health status, including review of consultants reports, laboratory and other test data, was performed as part of comprehensive evaluation and provision of chronic care management services.  ? ?SDOH (Social Determinants of Health) assessments and interventions performed:   ? ?CCM Care Plan ? ?Not on File ? ?Outpatient Encounter Medications as of 08/11/2021  ?Medication Sig  ? aspirin EC 81 MG tablet Take 1 tablet (81 mg total) by mouth in the morning. (blood thinner)  ? atorvastatin (LIPITOR) 40 MG tablet Take 1 tablet (40 mg total) by mouth at bedtime. (Cholesterol)  ? clopidogrel (PLAVIX) 75 MG tablet Take 1 tablet (75 mg total) by mouth in the morning. (blood thinner)  ? finasteride (PROSCAR) 5 MG tablet Take 1 tablet (5 mg total) by mouth daily.  ? lisinopril (ZESTRIL) 20 MG tablet Take 1 tablet (20 mg total) by mouth in the morning. (Blood pressure)  ? Multiple Vitamins-Minerals (MULTIVITAMIN ADULTS 50+ PO) Take by mouth.  ? Omega-3 1000 MG CAPS Take 1 capsule by mouth in the morning.  ? terazosin (HYTRIN) 5 MG capsule Take 1 capsule (5 mg total) by mouth at bedtime.   ? ?No facility-administered encounter medications on file as of 08/11/2021.  ? ? ?Patient Active Problem List  ? Diagnosis Date Noted  ? Cognitive deficit as late effect of cerebrovascular accident (CVA) 06/25/2020  ? Essential hypertension 06/25/2020  ? Imbalance 02/01/2018  ? Humeral head fracture, left, closed, initial encounter 09/22/2017  ? History of cerebrovascular accident (CVA) with residual deficit 09/09/2017  ? CKD (chronic kidney disease), stage III ([place]) 09/01/2017  ? Elevated PSA, between 10 and less than 20 ng/ml 07/22/2016  ? Bilateral lower extremity edema 07/22/2016  ? Abnormal glucose 07/15/2016  ? BPH with obstruction/lower urinary tract symptoms 04/10/2016  ? Benign hypertension with CKD (chronic kidney disease) stage III ([place]) 04/10/2016  ? Memory loss 04/10/2016  ? Gastric outlet obstruction 12/02/2014  ? Generalized abdominal pain   ? Benign fibroma of prostate 01/02/2011  ? HLD (hyperlipidemia) 01/02/2011  ? Obesity (BMI 30.0-34.9) 01/02/2011  ? ? ?Conditions to be addressed/monitored:HTN, HLD, CKD Stage 3, BPH, and memory loss and falls ? ?Care Plan : RNCM: General Plan of Care (Adult) for Chronic Disease Management and Care Coordination Needs  ?Updates made by [name], RN since 08/11/2021 12:00 AM  ?  ? ?Problem: RNCM: Development of Plan of Care for Chronic Disease Management (HTN, HLD, Memory loss, Falls, CKD3, BPH)   ?Priority: High  ?  ? ?Long-Range Goal: RNCM: Effective Management  of Plan of Care for Chronic Disease Management (HTN, HLD, Memory loss, Falls, CKD3, BPH)   ?Start Date: 04/24/2021  ?Expected End Date: 04/24/2022  ?Priority:  High  ?Note:   ?Current Barriers:  ?Knowledge Deficits related to plan of care for management of HTN, HLD, CKD Stage 3, and Memory loss, BPH  ?Care Coordination needs related to Mental Health Concerns   ?Chronic Disease Management support and education needs related to HTN, HLD, CKD Stage 3, and Memory loss, BPH ?Cognitive Deficits- Forgets  appointments on a regular basis ? ?RNCM Clinical Goal(s):  ?Patient will verbalize basic understanding of HTN, HLD, CKD Stage 3, BPH, and Memory Loss disease process and self health management plan as evidenced by keeping appointments, taking medications, following diet, and working with the CCM team to optimize health and well being. ?take all medications exactly as prescribed and will call provider for medication related questions as evidenced by compliance with medications and calling for refills before running out of medications    ?attend all scheduled medical appointments: saw pcp today, calendar provided to write down appointmens, as evidenced by keeping appointments and calling for schedule change needs. The patient ask the RNCM to assist him with getting an appointment to see urologist for follow up.        ?demonstrate improved and ongoing adherence to prescribed treatment plan for HTN, HLD, CKD Stage 3, BPH, and memory loss and falls as evidenced by stable conditions, stable VS, no acute changes in condition, and seeing providers on a regular basis. ?demonstrate a decrease in HTN, HLD, CKD Stage 3, BPH, and memory loss, and falls exacerbations  as evidenced by effective management of chronic disease management through collaboration with RN Care manager, provider, and care team.  ? ?Interventions: ?1:1 collaboration with primary care provider regarding development and update of comprehensive plan of care as evidenced by provider attestation and co-signature ?Inter-disciplinary care team collaboration (see longitudinal plan of care) ?Evaluation of current treatment plan related to  self management and patient's adherence to plan as established by provider ? ? ?Chronic Kidney Disease/BPH (Status: Goal on Track (progressing): YES.)  Long Term Goal  ?Last practice recorded BP readings:  ?BP Readings from Last 3 Encounters:  ?08/11/21 132/70  ?05/12/21 (!) 151/65  ?01/30/21 131/69  ?Most recent eGFR/CrCl: No  results found for: EGFR  No components found for: CRCL ? ?Assessed the patient understanding of chronic kidney disease    ?Evaluation of current treatment plan related to chronic kidney disease self management and patient's adherence to plan as established by provider. 04-24-2021: The patient states that he is doing well with everything but when he "pees" it is just a dribble. He has had this concern over a long period of time but states that it is getting worse. RNCM assisted the patient with calling [facility] at [phone]. Had to leave a detailed message and ask them to call the patient with an appointment, also left RNCM information for assistance. Sent in basket message to pcp for further recommendations. Will follow up with the patient for appointment information.  07-10-2021: The RNCM has been unable to get in touch with the patient but was able to get in touch with the patients friend and DRP, [name]. [name] is now helping the patient with his appointments and medications management. He told the patient he had to take his medications or he would end up having another stroke or something happen and he have to go back and stay at the nursing home. The patient does not want this. [name] is checking in every few days with the patient. He is also writing down his appointments. Education and support  given.   08-11-2021: Saw the patient face to face in the office. He states  he forgot his appointments with urology and he knows he needs help with remembering. RNCM fixed a calendar and wrote down appointment dates. Reviewed with the patient and then at the patient request called [name] and gave him the information also. Call made to [place] and spoke with Sharyn Lull. She needed to get a new PA for the patient to have a CT. The patient has an appointment on 08-22-2021 at 1130 am at [facility] mall to have a CT of abdomen. The patient has to be their by 11 am. He cannot  have any solid food, only liquids 4 hours prior to the test. The patient will have follow up with Dr. Nickolas Madrid  in the [facility] office on Sep 16, 2021 at 1:15 pm to review the CT and follow up with possible cyst

## 2021-08-11 NOTE — Progress Notes (Signed)
? ?Subjective:  ? ? Patient ID: [name], male    DOB: 04-Dec-1934, 87 y.o.   MRN: [id number] ? ?[name] is a 87 y.o. male presenting on 08/11/2021 for Hypertension, Hyperlipidemia, and Fatigue ? ? ?HPI ? ?CHRONIC HTN w/ CKD III ?Limited med adherence lately off alpha blocker ?Current Meds - Lisinopril '20mg'$  daily ?Reports good compliance, took meds today. Tolerating well, w/o complaints. ?Lifestyle: ?- Diet: balanced diet ?- Exercise: walking ?Denies CP, dyspnea, HA, edema, dizziness / lightheadedness ?  ?Hyperlipidemia ?History of CVA ?Cognitive Decline following stroke. ?Previuosly On Atorvastatin '40mg'$ , ASA 81 and Plavix '75mg'$  was on DAPT for stroke secondary prevention. Needs help adhering to medicines. ?  ?Elevated PSA / BPH Nocturia ?Urinary Incontinence ?Followed by Urology [facility] ?He had Prostate MRI ?Has been on Finasteride and Terazosin for med management, had issues with med adherence with memory ?Still has nocturia and urinary frequency. ?Admits Erectile Dysfunction ? ?Memory Loss ?Chronic gradual persistent problem, impacting his daily functions especially medication adherence. ? ? ? ?  01/21/2021  ? 11:06 AM 09/30/2020  ?  9:42 AM 06/26/2019  ? 11:27 AM  ?Depression screen PHQ 2/9  ?Decreased Interest 0 0 0  ?Down, Depressed, Hopeless 0 0 0  ?PHQ - 2 Score 0 0 0  ? ? ?Social History  ? ?Tobacco Use  ? Smoking status: Former  ?  Types: Cigarettes  ?  Quit date: 20  ?  Years since quitting: 38.3  ? Smokeless tobacco: Former  ?Vaping Use  ? Vaping Use: Never used  ?Substance Use Topics  ? Alcohol use: No  ? Drug use: No  ? ? ?Review of Systems ?Per HPI unless specifically indicated above ? ?   ?Objective:  ?  ?BP 132/70   Pulse 66   Ht '5\' 10"'$  (1.778 m)   Wt 234 lb 6.4 oz (106.3 kg)   SpO2 98%   BMI 33.63 kg/m?   ?Wt Readings from Last 3 Encounters:  ?08/11/21 234 lb 6.4 oz (106.3 kg)  ?05/12/21 233 lb 3.2 oz (105.8 kg)  ?01/30/21 227 lb 6.4 oz (103.1 kg)  ?  ?Physical Exam ?Vitals and nursing note  reviewed.  ?Constitutional:   ?   General: He is not in acute distress. ?   Appearance: Normal appearance. He is well-developed. He is not diaphoretic.  ?   Comments: Well-appearing, comfortable, cooperative  ?HENT:  ?   Head: Normocephalic and atraumatic.  ?Eyes:  ?   General:     ?   Right eye: No discharge.     ?   Left eye: No discharge.  ?   Conjunctiva/sclera: Conjunctivae normal.  ?Cardiovascular:  ?   Rate and Rhythm: Normal rate.  ?Pulmonary:  ?   Effort: Pulmonary effort is normal.  ?Skin: ?   General: Skin is warm and dry.  ?   Findings: No erythema or rash.  ?Neurological:  ?   Mental Status: He is alert and oriented to person, place, and time.  ?Psychiatric:     ?   Mood and Affect: Mood normal.     ?   Behavior: Behavior normal.     ?   Thought Content: Thought content normal.  ?   Comments: Well groomed, good eye contact, normal speech and thoughts, limited recall memory  ? ? ? ?Results for orders placed or performed during the hospital encounter of 05/12/21  ?Urine Culture  ? Specimen: Urine, Random  ?Result Value Ref Range  ?  Specimen Description    ?  URINE, RANDOM ?Performed at [facility], [address] [place], [place] [address] ?  ? Special Requests    ?  NONE ?Performed at [facility], [address] [place], [place] [address] ?  ? Culture (A)   ?  10,000 COLONIES/mL ENTEROCOCCUS FAECALIS ?10,000 COLONIES/mL STAPHYLOCOCCUS AUREUS ?  ? Report Status 05/17/2021 FINAL   ? Organism ID, Bacteria ENTEROCOCCUS FAECALIS (A)   ? Organism ID, Bacteria STAPHYLOCOCCUS AUREUS (A)   ?    Susceptibility  ? Enterococcus faecalis - MIC*  ?  AMPICILLIN <=2 SENSITIVE Sensitive   ?  NITROFURANTOIN <=16 SENSITIVE Sensitive   ?  VANCOMYCIN 1 SENSITIVE Sensitive   ?  * 10,000 COLONIES/mL ENTEROCOCCUS FAECALIS  ? Staphylococcus aureus - MIC*  ?  CIPROFLOXACIN <=0.5 SENSITIVE Sensitive   ?  GENTAMICIN <=0.5 SENSITIVE Sensitive   ?  NITROFURANTOIN <=16 SENSITIVE Sensitive   ?  OXACILLIN  <=0.25 SENSITIVE Sensitive   ?  TETRACYCLINE <=1 SENSITIVE Sensitive   ?  VANCOMYCIN <=0.5 SENSITIVE Sensitive   ?  TRIMETH/SULFA <=10 SENSITIVE Sensitive   ?  CLINDAMYCIN <=0.25 SENSITIVE Sensitive   ?  RIFAMPIN <=0.5 SENSITIVE Sensitive   ?  Inducible Clindamycin NEGATIVE Sensitive   ?  * 10,000 COLONIES/mL STAPHYLOCOCCUS AUREUS  ?Urinalysis, Complete w Microscopic  ?Result Value Ref Range  ? Color, Urine YELLOW YELLOW  ? APPearance CLEAR CLEAR  ? Specific Gravity, Urine 1.020 1.005 - 1.030  ? pH 7.0 5.0 - 8.0  ? Glucose, UA NEGATIVE NEGATIVE mg/dL  ? Hgb urine dipstick TRACE (A) NEGATIVE  ? Bilirubin Urine NEGATIVE NEGATIVE  ? Ketones, ur NEGATIVE NEGATIVE mg/dL  ? Protein, ur TRACE (A) NEGATIVE mg/dL  ? Nitrite NEGATIVE NEGATIVE  ? Leukocytes,Ua NEGATIVE NEGATIVE  ? Squamous Epithelial / LPF 0-5 0 - 5  ? WBC, UA 0-5 0 - 5 WBC/hpf  ? RBC / HPF 11-20 0 - 5 RBC/hpf  ? Bacteria, UA NONE SEEN NONE SEEN  ? Mucus PRESENT   ?Basic metabolic panel  ?Result Value Ref Range  ? Sodium 138 135 - 145 mmol/L  ? Potassium 4.6 3.5 - 5.1 mmol/L  ? Chloride 103 98 - 111 mmol/L  ? CO2 29 22 - 32 mmol/L  ? Glucose, Bld 95 70 - 99 mg/dL  ? BUN 27 (H) 8 - 23 mg/dL  ? Creatinine, Ser 1.08 0.61 - 1.24 mg/dL  ? Calcium 8.5 (L) 8.9 - 10.3 mg/dL  ? GFR, Estimated >60 >60 mL/min  ? Anion gap 6 5 - 15  ?PSA  ?Result Value Ref Range  ? Prostatic Specific Antigen 7.72 (H) 0.00 - 4.00 ng/mL  ? ?   ?Assessment & Plan:  ? ?Problem List Items Addressed This Visit   ? ? Memory loss  ? HLD (hyperlipidemia)  ? Relevant Orders  ? Lipid panel  ? History of cerebrovascular accident (CVA) with residual deficit  ? BPH with obstruction/lower urinary tract symptoms  ? Benign hypertension with CKD (chronic kidney disease) stage III (HCC) - Primary  ? Relevant Orders  ? COMPLETE METABOLIC PANEL WITH GFR  ? CBC with Differential/Platelet  ? Abnormal glucose  ? Relevant Orders  ? Hemoglobin A1c  ? ?Other Visit Diagnoses   ? ? Need for vaccination with  13-polyvalent pneumococcal conjugate vaccine      ? Relevant Orders  ? Pneumococcal conjugate vaccine 13-valent IM  ? ?  ?  ?Has seen Pam CCM  today, calendar given with upcoming scheduled apt, she has called and scheduled w/ Urology as well. ? ?Return to urology for management of BPH and Urinary Incontinence also ED if needed. Discussed improtance of scheduling f/u, keep on meds ? ?Pneumonia vaccine today ? ?Check labs over due for Cholesterol, sugar ? ?HTN CKD III ?Controlled currently on regimen. ? ?History CVA ?Memory deficit ?Continue on DAPT therapy and med management ?Continue to work with CCM team to help improve outcomes, issues with adherence meds/apt in the past. ? ? ?Orders Placed This Encounter  ?Procedures  ? Pneumococcal conjugate vaccine 13-valent IM  ? COMPLETE METABOLIC PANEL WITH GFR  ? CBC with Differential/Platelet  ? Lipid panel  ?  Order Specific Question:   Has the patient fasted?  ?  Answer:   Yes  ? Hemoglobin A1c  ? ? ? ?No orders of the defined types were placed in this encounter. ? ? ? ? ?Follow up plan: ?Return in about 6 months (around 02/10/2022) for 6 month follow-up HTN CKD Urology updates. ? ? ?Nobie Putnam, DO ?[facility] ?[facility] ?08/11/2021, 11:02 AM ?

## 2021-08-11 NOTE — Patient Instructions (Signed)
Visit Information ? ?Thank you for taking time to visit with me today. Please don't hesitate to contact me if I can be of assistance to you before our next scheduled telephone appointment. ? ?Following are the goals we discussed today:  ?RNCM Clinical Goal(s):  ?Patient will verbalize basic understanding of HTN, HLD, CKD Stage 3, BPH, and Memory Loss disease process and self health management plan as evidenced by keeping appointments, taking medications, following diet, and working with the CCM team to optimize health and well being. ?take all medications exactly as prescribed and will call provider for medication related questions as evidenced by compliance with medications and calling for refills before running out of medications    ?attend all scheduled medical appointments: saw pcp today, calendar provided to write down appointmens, as evidenced by keeping appointments and calling for schedule change needs. The patient ask the RNCM to assist him with getting an appointment to see urologist for follow up.        ?demonstrate improved and ongoing adherence to prescribed treatment plan for HTN, HLD, CKD Stage 3, BPH, and memory loss and falls as evidenced by stable conditions, stable VS, no acute changes in condition, and seeing providers on a regular basis. ?demonstrate a decrease in HTN, HLD, CKD Stage 3, BPH, and memory loss, and falls exacerbations  as evidenced by effective management of chronic disease management through collaboration with RN Care manager, provider, and care team.  ?  ?Interventions: ?1:1 collaboration with primary care provider regarding development and update of comprehensive plan of care as evidenced by provider attestation and co-signature ?Inter-disciplinary care team collaboration (see longitudinal plan of care) ?Evaluation of current treatment plan related to  self management and patient's adherence to plan as established by provider ?  ?  ?Chronic Kidney Disease/BPH (Status: Goal on  Track (progressing): YES.)  Long Term Goal  ?Last practice recorded BP readings:  ?   ?BP Readings from Last 3 Encounters:  ?08/11/21 132/70  ?05/12/21 (!) 151/65  ?01/30/21 131/69  ?Most recent eGFR/CrCl: No results found for: EGFR  No components found for: CRCL ?  ?Assessed the patient understanding of chronic kidney disease    ?Evaluation of current treatment plan related to chronic kidney disease self management and patient's adherence to plan as established by provider. 04-24-2021: The patient states that he is doing well with everything but when he "pees" it is just a dribble. He has had this concern over a long period of time but states that it is getting worse. RNCM assisted the patient with calling [place] at [phone]. Had to leave a detailed message and ask them to call the patient with an appointment, also left RNCM information for assistance. Sent in basket message to pcp for further recommendations. Will follow up with the patient for appointment information.  07-10-2021: The RNCM has been unable to get in touch with the patient but was able to get in touch with the patients friend and DRP, [name]. [name] is now helping the patient with his appointments and medications management. He told the patient he had to take his medications or he would end up having another stroke or something happen and he have to go back and stay at the nursing home. The patient does not want this. [name] is checking in every few days with the patient. He is also writing down his appointments. Education and support given.   08-11-2021: Saw the patient face to face in the office. He states  he  forgot his appointments with urology and he knows he needs help with remembering. RNCM fixed a calendar and wrote down appointment dates. Reviewed with the patient and then at the patient request called [name] and gave him the information also. Call made to [place] and spoke with  Sharyn Lull. She needed to get a new PA for the patient to have a CT. The patient has an appointment on 08-22-2021 at 1130 am at [facility] to have a CT of abdomen. The patient has to be their by 11 am. He cannot have any solid food, only liquids 4 hours prior to the test. The patient will have follow up with Dr. Nickolas Madrid  in the [facility] office on Sep 16, 2021 at 1:15 pm to review the CT and follow up with possible cystoscopy. Gave all of this information with phone numbers to [name] the patients friend who assist him with his medications and appointments. Used the teach back method for [name] to verbalized he understood the importance of the patient making his appointments and being on time for his appointments. Also instructed [name] that the patient had blood work today in the office as well.  ?Provided education to patient re: stroke prevention, s/s of heart attack and stroke    ?Reviewed prescribed diet heart healthy diet ?Reviewed medications with patient and discussed importance of compliance. 04-24-2021: The patient reviewed the medications with the [facility] and states he is taking as directed. 07-10-2021: The patient had stopped taking his medications again. [name] his friend is not filling up his pill box for 5 days and he goes and checks every few days. He got new pill boxes with am and pm on them and has instructed the patient what to do. The pharm D is also working with the patient and [name] to help with effective management of medications. 08-11-2021: The patient and [name] endorse the patient is taking the medications as directed. [name] has been fixing the medication boxes for the patient.     ?Advised patient, providing education and rationale, to monitor blood pressure daily and record, calling PCP for findings outside established parameters    ?Discussed complications of poorly controlled blood pressure such as heart disease, stroke, circulatory complications, vision complications, kidney  impairment, sexual dysfunction    ?Reviewed scheduled/upcoming provider appointments including: 08-22-2021 at 11 am at [facility] for CT testing. 09-16-2021 follow up with Dr. Nickolas Madrid at 1:15 pm at the [facility] office.  ?Advised patient to discuss urinary retention, BPH and not being able to void properly with provider. 3-23-2023[name] given the number to [facility] so he could call them and reschedule the patients appointment.   08-11-2021: [name] has the numbers for radiology and [facility] for any schedule change needs ?Discussed plans with patient for ongoing care management follow up and provided patient with direct contact information for care management team    ?Screening for signs and symptoms of depression related to chronic disease state      ?Discussed the impact of chronic kidney disease on daily life and mental health and acknowledged and normalized feelings of disempowerment, fear, and frustration    ?Assessed social determinant of health barriers    ?Provided education on kidney disease progression    ?Engage patient in early, proactive and ongoing discussion about goals of care and what matters most to them    ?Support coping and stress management by recognizing current strategies and assist in developing new strategies such as mindfulness, journaling, relaxation techniques,  problem-solving    ?  ?  ?Falls:  (Status: Goal on Track (progressing): YES.) Long Term Goal  ?Provided written and verbal education re: potential causes of falls and Fall prevention strategies ?Reviewed medications and discussed potential side effects of medications such as dizziness and frequent urination. 07-10-2021: Does take medications for urinary retention but is not always compliant  ?Advised patient of importance of notifying provider of falls ?Assessed for signs and symptoms of orthostatic hypotension ?Assessed for falls since last encounter. 04-24-2021: Denies any new falls since last outreach.  07-10-2021: [name] denies any new falls with the patient. 08-11-2021: The patient in the office today for a face to face visit. He states he has not had any new falls. Verbalized he is safe. ?Assessed pati

## 2021-08-11 NOTE — Patient Instructions (Addendum)
Thank you for coming to the office today. ? ?Labs today. ?Will call results when available. ? ?Please schedule with Urologist. ? ?[facility] -1st floor ?[address] ?[place],  [place]  [address] ?Phone: [phone] ? ?They can discuss Erectile Dysfunction, and Urinary Symptoms. ? ?OTC med updates can get more Co-Q10 when ready. ? ?Pneumonia vaccine Prevnar 13 today to finish series. ? ?Please schedule a Follow-up Appointment to: Return in about 6 months (around 02/10/2022) for 6 month follow-up HTN CKD Urology updates. ? ?If you have any other questions or concerns, please feel free to call the office or send a message through [facility]. You may also schedule an earlier appointment if necessary. ? ?Additionally, you may be receiving a survey about your experience at our office within a few days to 1 week by e-mail or mail. We value your feedback. ? ?Nobie Putnam, DO ?[place] ?

## 2021-08-12 LAB — CBC WITH DIFFERENTIAL/PLATELET
Absolute Monocytes: 613 cells/uL (ref 200–950)
Basophils Absolute: 22 cells/uL (ref 0–200)
Basophils Relative: 0.3 %
Eosinophils Absolute: 314 cells/uL (ref 15–500)
Eosinophils Relative: 4.3 %
HCT: 34.2 % — ABNORMAL LOW (ref 38.5–50.0)
Hemoglobin: 11.5 g/dL — ABNORMAL LOW (ref 13.2–17.1)
Lymphs Abs: 1789 cells/uL (ref 850–3900)
MCH: 33.7 pg — ABNORMAL HIGH (ref 27.0–33.0)
MCHC: 33.6 g/dL (ref 32.0–36.0)
MCV: 100.3 fL — ABNORMAL HIGH (ref 80.0–100.0)
MPV: 9.1 fL (ref 7.5–12.5)
Monocytes Relative: 8.4 %
Neutro Abs: 4563 cells/uL (ref 1500–7800)
Neutrophils Relative %: 62.5 %
Platelets: 176 10*3/uL (ref 140–400)
RBC: 3.41 10*6/uL — ABNORMAL LOW (ref 4.20–5.80)
RDW: 12.8 % (ref 11.0–15.0)
Total Lymphocyte: 24.5 %
WBC: 7.3 10*3/uL (ref 3.8–10.8)

## 2021-08-12 LAB — COMPLETE METABOLIC PANEL WITH GFR
AG Ratio: 1.5 (calc) (ref 1.0–2.5)
ALT: 14 U/L (ref 9–46)
AST: 17 U/L (ref 10–35)
Albumin: 3.9 g/dL (ref 3.6–5.1)
Alkaline phosphatase (APISO): 72 U/L (ref 35–144)
BUN/Creatinine Ratio: 23 (calc) — ABNORMAL HIGH (ref 6–22)
BUN: 29 mg/dL — ABNORMAL HIGH (ref 7–25)
CO2: 33 mmol/L — ABNORMAL HIGH (ref 20–32)
Calcium: 8.7 mg/dL (ref 8.6–10.3)
Chloride: 105 mmol/L (ref 98–110)
Creat: 1.25 mg/dL — ABNORMAL HIGH (ref 0.70–1.22)
Globulin: 2.6 g/dL (calc) (ref 1.9–3.7)
Glucose, Bld: 116 mg/dL (ref 65–139)
Potassium: 4.5 mmol/L (ref 3.5–5.3)
Sodium: 141 mmol/L (ref 135–146)
Total Bilirubin: 0.4 mg/dL (ref 0.2–1.2)
Total Protein: 6.5 g/dL (ref 6.1–8.1)
eGFR: 57 mL/min/{1.73_m2} — ABNORMAL LOW (ref >=60)

## 2021-08-12 LAB — LIPID PANEL
Cholesterol: 124 mg/dL (ref <200)
HDL: 40 mg/dL (ref >=40)
LDL Cholesterol (Calc): 52 mg/dL (calc)
Non-HDL Cholesterol (Calc): 84 mg/dL (calc) (ref <130)
Total CHOL/HDL Ratio: 3.1 (calc) (ref <5.0)
Triglycerides: 268 mg/dL — ABNORMAL HIGH (ref <150)

## 2021-08-12 LAB — HEMOGLOBIN A1C
Hgb A1c MFr Bld: 5.9 % of total Hgb — ABNORMAL HIGH (ref <5.7)
Mean Plasma Glucose: 123 mg/dL
eAG (mmol/L): 6.8 mmol/L

## 2021-08-13 ENCOUNTER — Ambulatory Visit: Payer: Medicare HMO | Admitting: Pharmacist

## 2021-08-13 DIAGNOSIS — R413 Other amnesia: Secondary | ICD-10-CM

## 2021-08-13 DIAGNOSIS — N183 Chronic kidney disease, stage 3 unspecified: Secondary | ICD-10-CM

## 2021-08-13 DIAGNOSIS — I129 Hypertensive chronic kidney disease with stage 1 through stage 4 chronic kidney disease, or unspecified chronic kidney disease: Secondary | ICD-10-CM

## 2021-08-14 NOTE — Patient Instructions (Signed)
Visit Information ? ?Thank you for taking time to visit with me today. Please don't hesitate to contact me if I can be of assistance to you before our next scheduled telephone appointment. ? ?Following are the goals we discussed today:  ? Goals Addressed   ? ?  ?  ?  ?  ? This Visit's Progress  ?  Pharmacy Goals     ?  Our goal bad cholesterol, or LDL, is less than 70 . This is why it is important to continue taking your atorvastatin. ? ?Please check your home blood pressure, keep a log of the results and bring this with you to your medical appointments. ? ?Feel free to call me with any questions or concerns. I look forward to our next call! ? ?Wallace Cullens, PharmD, BCACP ?Clinical Pharmacist ?[facility] ?[place] ?[phone] ?  ? ?  ? ? ? ?Our next appointment is by telephone on 5/24 at 1:45 pm ? ?Please call the care guide team at [phone] if you need to cancel or reschedule your appointment.  ? ? ?Caregiver verbalized understanding of instructions, educational materials, and care plan provided today and declined offer to receive copy of patient instructions, educational materials, and care plan.  ? ?

## 2021-08-14 NOTE — Chronic Care Management (AMB) (Signed)
? ?Chronic Care Management ?CCM Pharmacy Note ? ?08/13/2021 ?Name:  [name] MRN:  [id number] DOB:  January 10, 1935 ? ? ?Subjective: ?[name] is an 87 y.o. year old male who is a primary patient of [name], DO.  The CCM team was consulted for assistance with disease management and care coordination needs.   ? ?Unable to reach patient by telephone today. ? ?Engaged with caregiver by telephone for follow up visit for pharmacy case management and/or care coordination services.  ? ?Objective: ? ?Medications Reviewed Today   ? ? Reviewed by [name], DO (Physician) on 08/11/21 at 1102  Med List Status: <None>  ? ?Medication Order Taking? Sig Documenting Provider Last Dose Status Informant  ?aspirin EC 81 MG tablet [id number] Yes Take 1 tablet (81 mg total) by mouth in the morning. (blood thinner) Parks Ranger, Devonne Doughty, DO Taking Active   ?atorvastatin (LIPITOR) 40 MG tablet [id number] Yes Take 1 tablet (40 mg total) by mouth at bedtime. (Cholesterol) [name], DO Taking Active   ?clopidogrel (PLAVIX) 75 MG tablet [id number] Yes Take 1 tablet (75 mg total) by mouth in the morning. (blood thinner) Parks Ranger, Devonne Doughty, DO Taking Active   ?finasteride (PROSCAR) 5 MG tablet [id number] Yes Take 1 tablet (5 mg total) by mouth daily. [name], DO Taking Active   ?lisinopril (ZESTRIL) 20 MG tablet [id number] Yes Take 1 tablet (20 mg total) by mouth in the morning. (Blood pressure) Parks Ranger, Devonne Doughty, DO Taking Active   ?Multiple Vitamins-Minerals (MULTIVITAMIN ADULTS 50+ PO) [id number] Yes Take by mouth. [provider] Taking Active   ?Omega-3 1000 MG CAPS [id number] Yes Take 1 capsule by mouth in the morning. [provider] Taking Active   ?terazosin (HYTRIN) 5 MG capsule [id number] Yes Take 1 capsule (5 mg total) by mouth at bedtime. [name], DO Taking Active   ? ?  ?  ? ?  ? ? ?Pertinent Labs:  ? ?Lab Results   ?Component Value Date  ? CHOL 124 08/11/2021  ? HDL 40 08/11/2021  ? [place] 52 08/11/2021  ? TRIG 268 (H) 08/11/2021  ? CHOLHDL 3.1 08/11/2021  ? ?Lab Results  ?Component Value Date  ? CREATININE 1.25 (H) 08/11/2021  ? BUN 29 (H) 08/11/2021  ? NA 141 08/11/2021  ? K 4.5 08/11/2021  ? CL 105 08/11/2021  ? CO2 33 (H) 08/11/2021  ? ?BP Readings from Last 3 Encounters:  ?08/11/21 132/70  ?05/12/21 (!) 151/65  ?01/30/21 131/69  ? ?Pulse Readings from Last 3 Encounters:  ?08/11/21 66  ?05/12/21 86  ?01/30/21 83  ? ? ? ?SDOH:  (Social Determinants of Health) assessments and interventions performed:  ? ? ?CCM Care Plan ? ?Review of patient past medical history, allergies, medications, health status, including review of consultants reports, laboratory and other test data, was performed as part of comprehensive evaluation and provision of chronic care management services.  ? ?Care Plan : PharmD - Med Mgmt  ?Updates made by Rennis Petty, RPH-CPP since 08/14/2021 12:00 AM  ?  ? ?Problem: Disease Progression   ?  ? ?Long-Range Goal: Disease Progression Prevented or Minimized   ?Start Date: 07/03/2020  ?Expected End Date: 10/01/2020  ?Recent Progress: On track  ?Priority: High  ?Note:   ?Current Barriers:  ?Unable to self-administer medications as prescribed ?Chronic Disease Management support and education needs related to memory loss and changes ?Lack of blood pressure readings for clinical team ?Inconsistent engagement with care team/missed appointments ? ?  Pharmacist Clinical Goal(s):  ?Over the next 90 days, patient will achieve adherence to monitoring guidelines and medication adherence to achieve therapeutic efficacy through collaboration with PharmD and provider.  ? ?Interventions: ?1:1 collaboration with [name], DO regarding development and update of comprehensive plan of care as evidenced by provider attestation and co-signature ?Inter-disciplinary care team collaboration (see longitudinal plan of  care) ?Perform chart review. Office Visit with PCP on 4/24.  ?Today unable to reach patient by telephone. However, reach friend/caregiver, [name], by phone ?Remind caregiver of patient's upcoming appointments for CT with [facility] on 5/5 and [facility] on 5/30 ?[name] states plans to call patient prior to next appointment on 5/5 to remind him of location and of instruction for not eating 4 hours prior to appointment ? ? ?Medication Adherence: ?Caregiver reports continues to fill weekly pillboxes for patient (one for AM and one for PM) as well as check behind patient a couple of times/week to make sure taking medications consistently ?Denies patient missing doses. Plans to check on him again tonight/tomorrow ?Remind caregiver to watch supply of medications and to order refills in time to arrive from mail order ?Discuss option of returning to using a pill packaging pharmacy ? ?Patient Goals/Self-Care Activities ?Over the next 90 days, patient will:  ?- focus on medication adherence by using weekly pillbox ?- attend medical appointment as scheduled ? ? ?  ?  ? ?Plan: Telephone follow up appointment with care management team member scheduled for:  5/24 at 1:45 pm ? ?[name], PharmD, BCACP, CPP ?Clinical Pharmacist ?[facility] ?[place] ?[phone] ? ? ? ? ? ?

## 2021-08-17 DIAGNOSIS — N183 Chronic kidney disease, stage 3 unspecified: Secondary | ICD-10-CM

## 2021-08-17 DIAGNOSIS — N401 Enlarged prostate with lower urinary tract symptoms: Secondary | ICD-10-CM | POA: Diagnosis not present

## 2021-08-17 DIAGNOSIS — I129 Hypertensive chronic kidney disease with stage 1 through stage 4 chronic kidney disease, or unspecified chronic kidney disease: Secondary | ICD-10-CM | POA: Diagnosis not present

## 2021-08-17 DIAGNOSIS — I10 Essential (primary) hypertension: Secondary | ICD-10-CM

## 2021-08-17 DIAGNOSIS — E782 Mixed hyperlipidemia: Secondary | ICD-10-CM

## 2021-08-17 DIAGNOSIS — N138 Other obstructive and reflux uropathy: Secondary | ICD-10-CM

## 2021-08-17 DIAGNOSIS — R338 Other retention of urine: Secondary | ICD-10-CM

## 2021-08-22 ENCOUNTER — Ambulatory Visit
Admission: RE | Admit: 2021-08-22 | Discharge: 2021-08-22 | Disposition: A | Discharge status: Home / Self Care | Payer: Medicare HMO | Source: Ambulatory Visit | Attending: Urology | Admitting: Urology

## 2021-08-22 DIAGNOSIS — N138 Other obstructive and reflux uropathy: Secondary | ICD-10-CM

## 2021-08-22 DIAGNOSIS — N401 Enlarged prostate with lower urinary tract symptoms: Secondary | ICD-10-CM | POA: Diagnosis not present

## 2021-08-22 DIAGNOSIS — I700 Atherosclerosis of aorta: Secondary | ICD-10-CM | POA: Diagnosis not present

## 2021-08-22 DIAGNOSIS — R319 Hematuria, unspecified: Secondary | ICD-10-CM

## 2021-08-22 MED ORDER — IOHEXOL 300 MG/ML  SOLN
100.0000 mL | Freq: Once | INTRAMUSCULAR | Status: AC | PRN
  Administered 2021-08-22: 100 mL via INTRAVENOUS

## 2021-09-05 ENCOUNTER — Telehealth: Payer: Self-pay | Admitting: Urology

## 2021-09-05 NOTE — Telephone Encounter (Signed)
Patient calling about CT results?

## 2021-09-10 ENCOUNTER — Telehealth: Payer: Medicare HMO

## 2021-09-10 ENCOUNTER — Telehealth: Payer: Self-pay | Admitting: Pharmacist

## 2021-09-10 NOTE — Telephone Encounter (Signed)
  Chronic Care Management   Outreach Note  09/10/2021 Name: MURRAY DURRELL MRN: [id number] DOB: [birth year]  Referred by: [name], DO Reason for referral : No chief complaint on file.   Was unable to reach patient via telephone today x 2 and unable to leave a message as no voicemail picks up. Also unsuccessful in reaching friend/caregiver by telephone and unable to leave message as voicemail is full   Follow Up Plan: CM Pharmacist will attempt to reach patient by telephone again within the next 2 weeks  Wallace Cullens, PharmD, [facility] [phone]

## 2021-09-16 ENCOUNTER — Other Ambulatory Visit
Admission: RE | Admit: 2021-09-16 | Discharge: 2021-09-16 | Disposition: A | Discharge status: Home / Self Care | Payer: Medicare HMO | Attending: Urology | Admitting: Urology

## 2021-09-16 ENCOUNTER — Other Ambulatory Visit: Payer: Self-pay | Admitting: *Deleted

## 2021-09-16 ENCOUNTER — Ambulatory Visit (INDEPENDENT_AMBULATORY_CARE_PROVIDER_SITE_OTHER): Payer: Medicare HMO | Admitting: Urology

## 2021-09-16 ENCOUNTER — Encounter: Payer: Self-pay | Admitting: Urology

## 2021-09-16 VITALS — BP 146/67 | HR 71 | Ht 70.0 in | Wt 234.0 lb

## 2021-09-16 DIAGNOSIS — R310 Gross hematuria: Secondary | ICD-10-CM | POA: Diagnosis not present

## 2021-09-16 DIAGNOSIS — R3129 Other microscopic hematuria: Secondary | ICD-10-CM | POA: Diagnosis not present

## 2021-09-16 DIAGNOSIS — R3121 Asymptomatic microscopic hematuria: Secondary | ICD-10-CM

## 2021-09-16 LAB — URINALYSIS, COMPLETE (UACMP) WITH MICROSCOPIC
Bacteria, UA: NONE SEEN
Bilirubin Urine: NEGATIVE
Glucose, UA: NEGATIVE mg/dL
Hgb urine dipstick: NEGATIVE
Ketones, ur: NEGATIVE mg/dL
Leukocytes,Ua: NEGATIVE
Nitrite: NEGATIVE
Protein, ur: NEGATIVE mg/dL
RBC / HPF: NONE SEEN RBC/hpf (ref 0–5)
Specific Gravity, Urine: 1.02 (ref 1.005–1.030)
Squamous Epithelial / HPF: NONE SEEN (ref 0–5)
pH: 7 (ref 5.0–8.0)

## 2021-09-16 MED ORDER — CEPHALEXIN 250 MG PO CAPS
500.0000 mg | ORAL_CAPSULE | Freq: Once | ORAL | Status: AC
  Administered 2021-09-16: 500 mg via ORAL

## 2021-09-16 NOTE — Progress Notes (Signed)
Cystoscopy Procedure Note:  Indication: Microscopic hematuria  Keflex given for prophylaxis  After informed consent and discussion of the procedure and its risks, [name] was positioned and prepped in the standard fashion. Cystoscopy was performed with a flexible cystoscope. The urethra, bladder neck and entire bladder was visualized in a standard fashion. The prostate was moderate in size with lateral lobe hypertrophy, no median lobe. The ureteral orifices were visualized in their normal location and orientation.  Moderate bladder trabeculations, small diverticulum at dome, no suspicious lesions, no abnormalities on retroflexion.  Imaging: CT urogram 08/22/2021 with no abnormalities, prostate measures 45 g  Findings: Normal cystoscopy  Assessment and Plan: Continue BPH medications, RTC with [name], PA in 6 months PVR and symptom check  Nickolas Madrid, MD 09/16/2021

## 2021-09-17 ENCOUNTER — Ambulatory Visit (INDEPENDENT_AMBULATORY_CARE_PROVIDER_SITE_OTHER): Payer: Medicare HMO | Admitting: Pharmacist

## 2021-09-17 DIAGNOSIS — I129 Hypertensive chronic kidney disease with stage 1 through stage 4 chronic kidney disease, or unspecified chronic kidney disease: Secondary | ICD-10-CM

## 2021-09-17 DIAGNOSIS — E782 Mixed hyperlipidemia: Secondary | ICD-10-CM

## 2021-09-17 DIAGNOSIS — I10 Essential (primary) hypertension: Secondary | ICD-10-CM

## 2021-09-17 DIAGNOSIS — R413 Other amnesia: Secondary | ICD-10-CM

## 2021-09-17 NOTE — Chronic Care Management (AMB) (Signed)
Chronic Care Management CCM Pharmacy Note  09/17/2021 Name:  [name] MRN:  [id number] DOB:  December 31, 1934   Subjective: [name] is an 87 y.o. year old male who is a primary patient of Jonathan Hauser, DO.  The CCM team was consulted for assistance with disease management and care coordination needs.    Unable to reach patient by telephone today.   Engaged with caregiver by telephone for follow up visit for pharmacy case management and/or care coordination services.   Objective:  Medications Reviewed Today     Reviewed by Despina Hidden, CMA (Certified Medical Assistant) on 09/16/21 at 1326  Med List Status: <None>   Medication Order Taking? Sig Documenting Provider Last Dose Status Informant  aspirin EC 81 MG tablet [id number] Yes Take 1 tablet (81 mg total) by mouth in the morning. (blood thinner) Karamalegos, Devonne Doughty, DO Taking Active   atorvastatin (LIPITOR) 40 MG tablet [id number] Yes Take 1 tablet (40 mg total) by mouth at bedtime. (Cholesterol) Jonathan Hauser, DO Taking Active   clopidogrel (PLAVIX) 75 MG tablet [id number] Yes Take 1 tablet (75 mg total) by mouth in the morning. (blood thinner) Parks Ranger, Devonne Doughty, DO Taking Active   finasteride (PROSCAR) 5 MG tablet [id number] Yes Take 1 tablet (5 mg total) by mouth daily. Jonathan Hauser, DO Taking Active   lisinopril (ZESTRIL) 20 MG tablet [id number] Yes Take 1 tablet (20 mg total) by mouth in the morning. (Blood pressure) Karamalegos, Devonne Doughty, DO Taking Active   Multiple Vitamins-Minerals (MULTIVITAMIN ADULTS 50+ PO) [id number] Yes Take by mouth. [provider] Taking Active   Omega-3 1000 MG CAPS [id number] Yes Take 1 capsule by mouth in the morning. [provider] Taking Active   terazosin (HYTRIN) 5 MG capsule [id number] Yes Take 1 capsule (5 mg total) by mouth at bedtime. Jonathan Hauser, DO Taking Active             Pertinent Labs:   Lab  Results  Component Value Date   CHOL 124 08/11/2021   HDL 40 08/11/2021   LDLCALC 52 08/11/2021   TRIG 268 (H) 08/11/2021   CHOLHDL 3.1 08/11/2021   Lab Results  Component Value Date   CREATININE 1.25 (H) 08/11/2021   BUN 29 (H) 08/11/2021   NA 141 08/11/2021   K 4.5 08/11/2021   CL 105 08/11/2021   CO2 33 (H) 08/11/2021   BP Readings from Last 3 Encounters:  09/16/21 (!) 146/67  08/11/21 132/70  05/12/21 (!) 151/65   Pulse Readings from Last 3 Encounters:  09/16/21 71  08/11/21 66  05/12/21 86     SDOH:  (Social Determinants of Health) assessments and interventions performed:    [place]  Review of patient past medical history, allergies, medications, health status, including review of consultants reports, laboratory and other test data, was performed as part of comprehensive evaluation and provision of chronic care management services.   Care Plan : PharmD - Med Mgmt  Updates made by Rennis Petty, RPH-CPP since 09/17/2021 12:00 AM     Problem: Disease Progression      Long-Range Goal: Disease Progression Prevented or Minimized   Start Date: 07/03/2020  Expected End Date: 10/01/2020  Recent Progress: On track  Priority: High  Note:   Current Barriers:  Unable to self-administer medications as prescribed Chronic Disease Management support and education needs related to memory loss and changes Lack of blood pressure readings for clinical team Inconsistent engagement  with care team/missed appointments  Pharmacist Clinical Goal(s):  Over the next 90 days, patient will achieve adherence to monitoring guidelines and medication adherence to achieve therapeutic efficacy through collaboration with PharmD and provider.   Interventions: 1:1 collaboration with Jonathan Hauser, DO regarding development and update of comprehensive plan of care as evidenced by provider attestation and co-signature Inter-disciplinary care team collaboration (see  longitudinal plan of care) Perform chart review. Patient seen yesterday for Procedure Visit with [name] for Cystoscopy Today unable to reach patient by telephone. However, reach friend/caregiver, [name], by phone   Medication Adherence: Caregiver reports continues to fill weekly pillboxes for patient (one for AM and one for PM) as well as check behind patient a couple of times/week to make sure taking medications consistently Denies patient missing doses. Plans to spend day with patient tomorrow Remind caregiver to watch supply of medications and to order refills in time to arrive from mail order Discuss option of returning to using a pill packaging pharmacy  Hypertension: Current treatment: Lisinopril 20 mg QAM Terazosin 5 mg QHS Denies recent home blood pressure readings Reports will bring patient an upper arm blood pressure monitor or obtain from health plan over the counter benefit Encouraged caregiver to monitor patient's home blood pressure, keeping log of results, bring record to medical appointments, but to call office sooner for readings outside of established parameters or if patient having symptoms  Patient Goals/Self-Care Activities Over the next 90 days, patient will:  - focus on medication adherence by using weekly pillbox - attend medical appointment as scheduled        Plan: Telephone follow up appointment with care management team member scheduled for:  10/15/2021 at 2:45 PM  [name], PharmD, [facility] [place], [facility] [phone]

## 2021-09-17 NOTE — Patient Instructions (Signed)
Visit Information  Thank you for taking time to visit with me today. Please don't hesitate to contact me if I can be of assistance to you before our next scheduled telephone appointment.  Following are the goals we discussed today:   Goals Addressed             This Visit's Progress    Pharmacy Goals       Our goal bad cholesterol, or LDL, is less than 70 . This is why it is important to continue taking your atorvastatin.  Please check your home blood pressure, keep a log of the results and bring this with you to your medical appointments.  Feel free to call me with any questions or concerns. I look forward to our next call!   Wallace Cullens, PharmD, [place] [phone]         Our next appointment is by telephone on 10/15/2021 at 2:45 PM  Please call the care guide team at [phone] if you need to cancel or reschedule your appointment.    Caregiver verbalized understanding of instructions, educational materials, and care plan provided today and DECLINED offer to receive copy of patient instructions, educational materials, and care plan.

## 2021-09-22 ENCOUNTER — Telehealth: Payer: Self-pay

## 2021-09-22 ENCOUNTER — Ambulatory Visit (INDEPENDENT_AMBULATORY_CARE_PROVIDER_SITE_OTHER): Payer: Medicare HMO

## 2021-09-22 ENCOUNTER — Telehealth: Payer: Medicare HMO

## 2021-09-22 DIAGNOSIS — I10 Essential (primary) hypertension: Secondary | ICD-10-CM

## 2021-09-22 DIAGNOSIS — I69319 Unspecified symptoms and signs involving cognitive functions following cerebral infarction: Secondary | ICD-10-CM

## 2021-09-22 DIAGNOSIS — Z9181 History of falling: Secondary | ICD-10-CM

## 2021-09-22 DIAGNOSIS — N183 Chronic kidney disease, stage 3 unspecified: Secondary | ICD-10-CM

## 2021-09-22 DIAGNOSIS — R413 Other amnesia: Secondary | ICD-10-CM

## 2021-09-22 DIAGNOSIS — E782 Mixed hyperlipidemia: Secondary | ICD-10-CM

## 2021-09-22 DIAGNOSIS — N401 Enlarged prostate with lower urinary tract symptoms: Secondary | ICD-10-CM

## 2021-09-22 NOTE — Patient Instructions (Signed)
Visit Information  Thank you for taking time to visit with me today. Please don't hesitate to contact me if I can be of assistance to you before our next scheduled telephone appointment.  Following are the goals we discussed today:  Chronic Kidney Disease/BPH (Status: Goal on Track (progressing): YES.)  Long Term Goal  Last practice recorded BP readings:     BP Readings from Last 3 Encounters:  09/16/21 (!) 146/67  08/11/21 132/70  05/12/21 (!) 151/65  Most recent eGFR/CrCl:       Lab Results  Component Value Date    EGFR 57 (L) 08/11/2021    No components found for: CRCL   Assessed the patient understanding of chronic kidney disease. 09-22-2021: The patient went to see the urologist and had a cytoscopy. No acute findings were noted. Spoke with the patients friend today that helps with his care and he feels like the patient is doing well as long as he takes his medications. Education and support given.     Evaluation of current treatment plan related to chronic kidney disease self management and patient's adherence to plan as established by provider. 04-24-2021: The patient states that he is doing well with everything but when he "pees" it is just a dribble. He has had this concern over a long period of time but states that it is getting worse. RNCM assisted the patient with calling [facility] at [phone]. Had to leave a detailed message and ask them to call the patient with an appointment, also left RNCM information for assistance. Sent in basket message to pcp for further recommendations. Will follow up with the patient for appointment information.  07-10-2021: The RNCM has been unable to get in touch with the patient but was able to get in touch with the patients friend and DRP, [name]. [name] is now helping the patient with his appointments and medications management. He told the patient he had to take his medications or he would end up having another stroke or  something happen and he have to go back and stay at the nursing home. The patient does not want this. [name] is checking in every few days with the patient. He is also writing down his appointments. Education and support given.   08-11-2021: Saw the patient face to face in the office. He states  he forgot his appointments with urology and he knows he needs help with remembering. RNCM fixed a calendar and wrote down appointment dates. Reviewed with the patient and then at the patient request called [name] and gave him the information also. Call made to [facility] and spoke with Sharyn Lull. She needed to get a new PA for the patient to have a CT. The patient has an appointment on 08-22-2021 at 1130 am at [facility] to have a CT of abdomen. The patient has to be their by 11 am. He cannot have any solid food, only liquids 4 hours prior to the test. The patient will have follow up with Dr. Nickolas Madrid  in the [facility] office on Sep 16, 2021 at 1:15 pm to review the CT and follow up with possible cystoscopy. Gave all of this information with phone numbers to [name] the patients friend who assist him with his medications and appointments. Used the teach back method for [name] to verbalized he understood the importance of the patient making his appointments and being on time for his appointments. Also instructed [name] that the patient had blood work today in  the office as well. 09-22-2021: The patient has completed testing and his friend [name] said that he seems to be doing well. He checks on him often and states that he has a nephew that lives across from them that if he cannot get he calls him to check on them. He states he is stable at this time.  Provided education to patient re: stroke prevention, s/s of heart attack and stroke    Reviewed prescribed diet heart healthy diet Reviewed medications with patient and discussed importance of compliance. 04-24-2021: The patient reviewed the  medications with the [facility] and states he is taking as directed. 07-10-2021: The patient had stopped taking his medications again. [name] his friend is not filling up his pill box for 5 days and he goes and checks every few days. He got new pill boxes with am and pm on them and has instructed the patient what to do. The pharm D is also working with the patient and [name] to help with effective management of medications. 08-11-2021: The patient and [name] endorse the patient is taking the medications as directed. [name] has been fixing the medication boxes for the patient.  09-22-2021: [name] states that Mavis has been fixing his medications some but [name] is going back and checking. He states that he can tell when he is not taking his medications because he gets confused more and he notices the changes. He works with the [facility] as well and has regular outreaches with the CCM team. Education and support given.    Advised patient, providing education and rationale, to monitor blood pressure daily and record, calling PCP for findings outside established parameters    Discussed complications of poorly controlled blood pressure such as heart disease, stroke, circulatory complications, vision complications, kidney impairment, sexual dysfunction    Reviewed scheduled/upcoming provider appointments including: 08-22-2021 at 11 am at [facility] for CT testing. 09-16-2021 follow up with Dr. Nickolas Madrid at 1:15 pm at the [facility] office. 09-22-2021: Has seen specialist and pcp. No upcoming appointments but [name] is assisting him as needed with making sure he keeps his appointments.  Advised patient to discuss urinary retention, BPH and not being able to void properly with provider. 3-23-2023[name] given the number to [facility] so he could call them and reschedule the patients appointment.   08-11-2021: [name] has the numbers for radiology and [facility] for any schedule change needs Discussed  plans with patient for ongoing care management follow up and provided patient with direct contact information for care management team    Screening for signs and symptoms of depression related to chronic disease state      Discussed the impact of chronic kidney disease on daily life and mental health and acknowledged and normalized feelings of disempowerment, fear, and frustration    Assessed social determinant of health barriers    Provided education on kidney disease progression    Engage patient in early, proactive and ongoing discussion about goals of care and what matters most to them    Support coping and stress management by recognizing current strategies and assist in developing new strategies such as mindfulness, journaling, relaxation techniques, problem-solving        Falls:  (Status: Goal on Track (progressing): YES.) Long Term Goal  Provided written and verbal education re: potential causes of falls and Fall prevention strategies Reviewed medications and discussed potential side effects of medications such as dizziness and frequent urination. 07-10-2021: Does take medications for urinary retention but is  not always compliant. 09-22-2021: The patient is being more compliant with medications. [name] is assisting him.  Advised patient of importance of notifying provider of falls Assessed for signs and symptoms of orthostatic hypotension Assessed for falls since last encounter. 04-24-2021: Denies any new falls since last outreach. 07-10-2021: [name] denies any new falls with the patient. 08-11-2021: The patient in the office today for a face to face visit. He states he has not had any new falls. Verbalized he is safe. 09-22-2021: To his knowledge [name] states he has not had any new falls. Education and support given.  Assessed patients knowledge of fall risk prevention secondary to previously provided education. 09-22-2021: The patient is at high fall risk.  Provided patient information for fall  alert systems Screening for signs and symptoms of depression related to chronic disease state Assessed social determinant of health barriers   Memory Loss  (Status: Goal on Track (progressing): YES.) Long Term Goal  Evaluation of current treatment plan related to  Memory loss , Memory Deficits self-management and patient's adherence to plan as established by provider. 07-10-2021: The patient continues to have memory concerns. The patients friend has come back in to help the patient with his medications and getting his MD appointments straight. The patient had not been taking his medications and had missed several appointments. [name] is helping the patient and will continue to monitor at this time. 08-11-2021: The patient admits he is very forgetful. Did ask the patient about how he remembers how to get home and to appointments. He says he has no issues with this but he forgets his appointments. Admits he needs help with short term memory things. His friend [name] is helping him and fixing his pills on a weekly basis. He states that he does not forget to take his pills. A calendar given to the patient and upcoming appointments written in red. Reviewed with the patient in person and talked with [name] by phone. Education and support given. 09-22-2021: [name] states the patient is stable at this time. He says when he is not taking his medications he can tell a difference. [place]. He states that he did really well on the trip. He likes to travel and go with [name] on trips. He is concerned for his wife and her decline in memory. The patients friends expressed concern over safety. Collaboration with the pcps for recommendations on expressed concerns. [name] states [place] is safe when driving but he is having a hard time keeping his wife from driving and he has been hiding the keys but then forgets where he has put them. Reflective listening  and support given.  Discussed plans with patient for ongoing care management follow up and provided patient with direct contact information for care management team Advised patient to call the office for changes in memory, anxiety, depression; Provided education to patient re: writing down appointments, keeping appointments, and an effective way to make sure the patient takes medications as directed. 09-22-2021: Reviewed with [name]. Education and support; Reviewed scheduled/upcoming provider appointments including Saw pcp today. New appointments made with urology and added to his calendar in red ink. Also put pharm D and RNCM information down. 09-22-2021: Has completed several appointments. Discussed [name] continuing to help with appointments and to call the office with new needs or concerns.  Discussed plans with patient for ongoing care management follow up and provided patient with direct contact information  for care management team; Advised patient to discuss changes in memory  with provider; Screening for signs and symptoms of depression related to chronic disease state;  Assessed social determinant of health barriers;    Hyperlipidemia:  (Status: Goal on Track (progressing): YES.) Long Term Goal       Lab Results  Component Value Date    CHOL 124 08/11/2021    HDL 40 08/11/2021    LDLCALC 52 08/11/2021    TRIG 268 (H) 08/11/2021    CHOLHDL 3.1 08/11/2021   08-11-2021: The patient had new labwork drawn today.    Medication review performed; medication list updated in electronic medical record. 08-11-2021: [name] has fixed the patients bill box and he confirmed that he is currently taking atorvastatin 40 mg QD. 09-22-2021: [name] continues to assist the patient in filing his pill boxes and checking behind him a couple times a week. He says for now he is currently compliant with his medications.  Provider established cholesterol goals reviewed; Counseled on importance of regular laboratory  monitoring as prescribed. 09-22-2021: Review of the need for regular lab work to be done. Up to date labs in April. ; Provided HLD educational materials; Reviewed role and benefits of statin for ASCVD risk reduction. 08-11-2021: Education provided that the patient needs to take his medications as directed.  Discussed strategies to manage statin-induced myalgias; Reviewed importance of limiting foods high in cholesterol;   Hypertension: (Status: Goal on Track (progressing): YES.) Long Term Goal  Last practice recorded BP readings:     BP Readings from Last 3 Encounters:  09/16/21 (!) 146/67  08/11/21 132/70  05/12/21 (!) 151/65  Most recent eGFR/CrCl:       Lab Results  Component Value Date    EGFR 57 (L) 08/11/2021    No components found for: CRCL   Evaluation of current treatment plan related to hypertension self management and patient's adherence to plan as established by provider. 09-22-2021: Review with the patients friend the importance of management of HTN and taking medications as directed.    Provided education to patient re: stroke prevention, s/s of heart attack and stroke; Reviewed prescribed diet heart healthy Reviewed medications with patient and discussed importance of compliance. 07-10-2021: Review and education given. Provided [name] with the [facility] number so he could reach out for help and assistance. 09-22-2021: [name] continues to help with medications management. States the patient is being compliant at this time.;  Discussed plans with patient for ongoing care management follow up and provided patient with direct contact information for care management team; Advised patient, providing education and rationale, to monitor blood pressure daily and record, calling PCP for findings outside established parameters;  Provided education on prescribed diet heart healthy;  Discussed complications of poorly controlled blood pressure such as heart disease, stroke, circulatory  complications, vision complications, kidney impairment, sexual dysfunction  Our next appointment is by telephone on 11-24-2021 at 230 pm  Please call the care guide team at [phone] if you need to cancel or reschedule your appointment.   If you are experiencing a Mental Health or [place] or need someone to talk to, please call the Suicide and Crisis Lifeline: 988 call the [place] National Suicide Prevention Lifeline: 270-389-1959 or TTY: 332-324-0357 TTY 765-331-5619) to talk to a trained counselor call 1-800-273-TALK (toll free, 24 hour hotline)   The patient verbalized understanding of instructions, educational materials, and care plan provided today and DECLINED offer to receive copy of patient instructions, educational materials, and care  plan.   [name] RN, MSN, [place] Mobile: [phone]

## 2021-09-22 NOTE — Telephone Encounter (Signed)
  Care Management   Follow Up Note   09/22/2021 Name: [name] MRN: [id number] DOB: [birth year]   Referred by: [name], DO Reason for referral : Chronic Care Management (RNCM: Follow up for Chronic Disease Management and Care Coordination Needs )   Able to connect with the patients friend, [name]. See new encounter.   Follow Up Plan: Telephone follow up appointment with care management team member scheduled for: 11-24-2021 at 230 pm  [name] RN, MSN, [place] Mobile: [phone]

## 2021-09-22 NOTE — Chronic Care Management (AMB) (Signed)
Chronic Care Management   CCM RN Visit Note  09/22/2021 Name: [name] MRN: [id number] DOB: [birth year]  Subjective: [name] is a 87 y.o. year old male who is a primary care patient of Jonathan Hauser, DO. The care management team was consulted for assistance with disease management and care coordination needs.    Engaged with patient by telephone for follow up visit in response to provider referral for case management and/or care coordination services. Unable to speak to the patient but was able to speak with [name] the patients friend.  Consent to Services:  The patient was given information about Chronic Care Management services, agreed to services, and gave verbal consent prior to initiation of services.  Please see initial visit note for detailed documentation.   Patient agreed to services and verbal consent obtained.   Assessment: Review of patient past medical history, allergies, medications, health status, including review of consultants reports, laboratory and other test data, was performed as part of comprehensive evaluation and provision of chronic care management services.   SDOH (Social Determinants of Health) assessments and interventions performed:    [place]  Not on File  Outpatient Encounter Medications as of 09/22/2021  Medication Sig   aspirin EC 81 MG tablet Take 1 tablet (81 mg total) by mouth in the morning. (blood thinner)   atorvastatin (LIPITOR) 40 MG tablet Take 1 tablet (40 mg total) by mouth at bedtime. (Cholesterol)   clopidogrel (PLAVIX) 75 MG tablet Take 1 tablet (75 mg total) by mouth in the morning. (blood thinner)   finasteride (PROSCAR) 5 MG tablet Take 1 tablet (5 mg total) by mouth daily.   lisinopril (ZESTRIL) 20 MG tablet Take 1 tablet (20 mg total) by mouth in the morning. (Blood pressure)   Multiple Vitamins-Minerals (MULTIVITAMIN ADULTS 50+ PO) Take by mouth.   Omega-3 1000 MG CAPS Take 1 capsule by mouth in the  morning.   terazosin (HYTRIN) 5 MG capsule Take 1 capsule (5 mg total) by mouth at bedtime.   No facility-administered encounter medications on file as of 09/22/2021.    Patient Active Problem List   Diagnosis Date Noted   Cognitive deficit as late effect of cerebrovascular accident (CVA) 06/25/2020   Essential hypertension 06/25/2020   Imbalance 02/01/2018   Humeral head fracture, left, closed, initial encounter 09/22/2017   History of cerebrovascular accident (CVA) with residual deficit 09/09/2017   CKD (chronic kidney disease), stage III ([place]) 09/01/2017   Elevated PSA, between 10 and less than 20 ng/ml 07/22/2016   Bilateral lower extremity edema 07/22/2016   Abnormal glucose 07/15/2016   BPH with obstruction/lower urinary tract symptoms 04/10/2016   Benign hypertension with CKD (chronic kidney disease) stage III ([place]) 04/10/2016   Memory loss 04/10/2016   Gastric outlet obstruction 12/02/2014   Generalized abdominal pain    Benign fibroma of prostate 01/02/2011   HLD (hyperlipidemia) 01/02/2011   Obesity (BMI 30.0-34.9) 01/02/2011    Conditions to be addressed/monitored:HTN, HLD, CKD Stage 3, BPH, and memory loss and falls  Care Plan : RNCM: General Plan of Care (Adult) for Chronic Disease Management and Care Coordination Needs  Updates made by [name], RN since 09/22/2021 12:00 AM     Problem: RNCM: Development of Plan of Care for Chronic Disease Management (HTN, HLD, Memory loss, Falls, CKD3, BPH)   Priority: High     Long-Range Goal: RNCM: Effective Management  of Plan of Care for Chronic Disease Management (HTN, HLD, Memory loss,  Falls, CKD3, BPH)   Start Date: 04/24/2021  Expected End Date: 04/24/2022  Priority: High  Note:   Current Barriers:  Knowledge Deficits related to plan of care for management of HTN, HLD, CKD Stage 3, and Memory loss, BPH  Care Coordination needs related to Mental Health Concerns   Chronic Disease Management support and education needs  related to HTN, HLD, CKD Stage 3, and Memory loss, BPH Cognitive Deficits- Forgets appointments on a regular basis  RNCM Clinical Goal(s):  Patient will verbalize basic understanding of HTN, HLD, CKD Stage 3, BPH, and Memory Loss disease process and self health management plan as evidenced by keeping appointments, taking medications, following diet, and working with the CCM team to optimize health and well being. take all medications exactly as prescribed and will call provider for medication related questions as evidenced by compliance with medications and calling for refills before running out of medications    attend all scheduled medical appointments: saw pcp today, calendar provided to write down appointmens, as evidenced by keeping appointments and calling for schedule change needs. The patient ask the RNCM to assist him with getting an appointment to see urologist for follow up.        demonstrate improved and ongoing adherence to prescribed treatment plan for HTN, HLD, CKD Stage 3, BPH, and memory loss and falls as evidenced by stable conditions, stable VS, no acute changes in condition, and seeing providers on a regular basis. demonstrate a decrease in HTN, HLD, CKD Stage 3, BPH, and memory loss, and falls exacerbations  as evidenced by effective management of chronic disease management through collaboration with RN Care manager, provider, and care team.   Interventions: 1:1 collaboration with primary care provider regarding development and update of comprehensive plan of care as evidenced by provider attestation and co-signature Inter-disciplinary care team collaboration (see longitudinal plan of care) Evaluation of current treatment plan related to  self management and patient's adherence to plan as established by provider   Chronic Kidney Disease/BPH (Status: Goal on Track (progressing): YES.)  Long Term Goal  Last practice recorded BP readings:  BP Readings from Last 3 Encounters:   09/16/21 (!) 146/67  08/11/21 132/70  05/12/21 (!) 151/65  Most recent eGFR/CrCl:  Lab Results  Component Value Date   EGFR 57 (L) 08/11/2021    No components found for: CRCL  Assessed the patient understanding of chronic kidney disease. 09-22-2021: The patient went to see the urologist and had a cytoscopy. No acute findings were noted. Spoke with the patients friend today that helps with his care and he feels like the patient is doing well as long as he takes his medications. Education and support given.     Evaluation of current treatment plan related to chronic kidney disease self management and patient's adherence to plan as established by provider. 04-24-2021: The patient states that he is doing well with everything but when he "pees" it is just a dribble. He has had this concern over a long period of time but states that it is getting worse. RNCM assisted the patient with calling [facility] at [phone]. Had to leave a detailed message and ask them to call the patient with an appointment, also left RNCM information for assistance. Sent in basket message to pcp for further recommendations. Will follow up with the patient for appointment information.  07-10-2021: The RNCM has been unable to get in touch with the patient but was able to get in touch with the patients friend  and DRP, [name] [name] is now helping the patient with his appointments and medications management. He told the patient he had to take his medications or he would end up having another stroke or something happen and he have to go back and stay at the nursing home. The patient does not want this. [name] is checking in every few days with the patient. He is also writing down his appointments. Education and support given.   08-11-2021: Saw the patient face to face in the office. He states  he forgot his appointments with urology and he knows he needs help with remembering. RNCM fixed a calendar and wrote  down appointment dates. Reviewed with the patient and then at the patient request called [name] and gave him the information also. Call made to [facility] and spoke with Sharyn Lull. She needed to get a new PA for the patient to have a CT. The patient has an appointment on 08-22-2021 at 1130 am at [facility] to have a CT of abdomen. The patient has to be their by 11 am. He cannot have any solid food, only liquids 4 hours prior to the test. The patient will have follow up with Dr. Nickolas Madrid  in the [facility] office on Sep 16, 2021 at 1:15 pm to review the CT and follow up with possible cystoscopy. Gave all of this information with phone numbers to [name] the patients friend who assist him with his medications and appointments. Used the teach back method for [name] to verbalized he understood the importance of the patient making his appointments and being on time for his appointments. Also instructed [name] that the patient had blood work today in the office as well. 09-22-2021: The patient has completed testing and his friend [name] said that he seems to be doing well. He checks on him often and states that he has a nephew that lives across from them that if he cannot get he calls him to check on them. He states he is stable at this time.  Provided education to patient re: stroke prevention, s/s of heart attack and stroke    Reviewed prescribed diet heart healthy diet Reviewed medications with patient and discussed importance of compliance. 04-24-2021: The patient reviewed the medications with the [facility] and states he is taking as directed. 07-10-2021: The patient had stopped taking his medications again. [name] his friend is not filling up his pill box for 5 days and he goes and checks every few days. He got new pill boxes with am and pm on them and has instructed the patient what to do. The pharm D is also working with the patient and [name] to help with effective management of  medications. 08-11-2021: The patient and [name] endorse the patient is taking the medications as directed. [name] has been fixing the medication boxes for the patient.  09-22-2021: [name] states that Izayiah has been fixing his medications some but [name] is going back and checking. He states that he can tell when he is not taking his medications because he gets confused more and he notices the changes. He works with the [facility] as well and has regular outreaches with the CCM team. Education and support given.    Advised patient, providing education and rationale, to monitor blood pressure daily and record, calling PCP for findings outside established parameters    Discussed complications of poorly controlled blood pressure such as heart disease, stroke, circulatory complications, vision complications, kidney impairment, sexual dysfunction  Reviewed scheduled/upcoming provider appointments including: 08-22-2021 at 11 am at [facility] for CT testing. 09-16-2021 follow up with Dr. Nickolas Madrid at 1:15 pm at the [facility] office. 09-22-2021: Has seen specialist and pcp. No upcoming appointments but [name] is assisting him as needed with making sure he keeps his appointments.  Advised patient to discuss urinary retention, BPH and not being able to void properly with provider. 3-23-2023[name] given the number to [facility] so he could call them and reschedule the patients appointment.   08-11-2021: [name] has the numbers for radiology and [facility] for any schedule change needs Discussed plans with patient for ongoing care management follow up and provided patient with direct contact information for care management team    Screening for signs and symptoms of depression related to chronic disease state      Discussed the impact of chronic kidney disease on daily life and mental health and acknowledged and normalized feelings of disempowerment, fear, and frustration    Assessed social  determinant of health barriers    Provided education on kidney disease progression    Engage patient in early, proactive and ongoing discussion about goals of care and what matters most to them    Support coping and stress management by recognizing current strategies and assist in developing new strategies such as mindfulness, journaling, relaxation techniques, problem-solving      Falls:  (Status: Goal on Track (progressing): YES.) Long Term Goal  Provided written and verbal education re: potential causes of falls and Fall prevention strategies Reviewed medications and discussed potential side effects of medications such as dizziness and frequent urination. 07-10-2021: Does take medications for urinary retention but is not always compliant. 09-22-2021: The patient is being more compliant with medications. [name] is assisting him.  Advised patient of importance of notifying provider of falls Assessed for signs and symptoms of orthostatic hypotension Assessed for falls since last encounter. 04-24-2021: Denies any new falls since last outreach. 07-10-2021: [name] denies any new falls with the patient. 08-11-2021: The patient in the office today for a face to face visit. He states he has not had any new falls. Verbalized he is safe. 09-22-2021: To his knowledge [name] states he has not had any new falls. Education and support given.  Assessed patients knowledge of fall risk prevention secondary to previously provided education. 09-22-2021: The patient is at high fall risk.  Provided patient information for fall alert systems Screening for signs and symptoms of depression related to chronic disease state Assessed social determinant of health barriers  Memory Loss  (Status: Goal on Track (progressing): YES.) Long Term Goal  Evaluation of current treatment plan related to  Memory loss , Memory Deficits self-management and patient's adherence to plan as established by provider. 07-10-2021: The patient continues to  have memory concerns. The patients friend has come back in to help the patient with his medications and getting his MD appointments straight. The patient had not been taking his medications and had missed several appointments. [name] is helping the patient and will continue to monitor at this time. 08-11-2021: The patient admits he is very forgetful. Did ask the patient about how he remembers how to get home and to appointments. He says he has no issues with this but he forgets his appointments. Admits he needs help with short term memory things. His friend [name] is helping him and fixing his pills on a weekly basis. He states that he does not forget to take his pills. A calendar given  to the patient and upcoming appointments written in red. Reviewed with the patient in person and talked with [name] by phone. Education and support given. 09-22-2021: [name] states the patient is stable at this time. He says when he is not taking his medications he can tell a difference. [place] the [place]. He states that he did really well on the trip. He likes to travel and go with [name] on trips. He is concerned for his wife and her decline in memory. The patients friends expressed concern over safety. Collaboration with the pcps for recommendations on expressed concerns. [name] states Yishai is safe when driving but he is having a hard time keeping his wife from driving and he has been hiding the keys but then forgets where he has put them. Reflective listening and support given.  Discussed plans with patient for ongoing care management follow up and provided patient with direct contact information for care management team Advised patient to call the office for changes in memory, anxiety, depression; Provided education to patient re: writing down appointments, keeping appointments, and an effective way to make sure the patient takes medications as directed.  09-22-2021: Reviewed with [name]. Education and support; Reviewed scheduled/upcoming provider appointments including Saw pcp today. New appointments made with urology and added to his calendar in red ink. Also put pharm D and RNCM information down. 09-22-2021: Has completed several appointments. Discussed [name] continuing to help with appointments and to call the office with new needs or concerns.  Discussed plans with patient for ongoing care management follow up and provided patient with direct contact information for care management team; Advised patient to discuss changes in memory  with provider; Screening for signs and symptoms of depression related to chronic disease state;  Assessed social determinant of health barriers;   Hyperlipidemia:  (Status: Goal on Track (progressing): YES.) Long Term Goal  Lab Results  Component Value Date   CHOL 124 08/11/2021   HDL 40 08/11/2021   LDLCALC 52 08/11/2021   TRIG 268 (H) 08/11/2021   CHOLHDL 3.1 08/11/2021   08-11-2021: The patient had new labwork drawn today.   Medication review performed; medication list updated in electronic medical record. 08-11-2021: [name] has fixed the patients bill box and he confirmed that he is currently taking atorvastatin 40 mg QD. 09-22-2021: [name] continues to assist the patient in filing his pill boxes and checking behind him a couple times a week. He says for now he is currently compliant with his medications.  Provider established cholesterol goals reviewed; Counseled on importance of regular laboratory monitoring as prescribed. 09-22-2021: Review of the need for regular lab work to be done. Up to date labs in April. ; Provided HLD educational materials; Reviewed role and benefits of statin for ASCVD risk reduction. 08-11-2021: Education provided that the patient needs to take his medications as directed.  Discussed strategies to manage statin-induced myalgias; Reviewed importance of limiting foods high in  cholesterol;  Hypertension: (Status: Goal on Track (progressing): YES.) Long Term Goal  Last practice recorded BP readings:  BP Readings from Last 3 Encounters:  09/16/21 (!) 146/67  08/11/21 132/70  05/12/21 (!) 151/65  Most recent eGFR/CrCl:  Lab Results  Component Value Date   EGFR 57 (L) 08/11/2021    No components found for: CRCL  Evaluation of current treatment plan related to hypertension self management and patient's adherence to plan as established by provider. 09-22-2021: Review with  the patients friend the importance of management of HTN and taking medications as directed.    Provided education to patient re: stroke prevention, s/s of heart attack and stroke; Reviewed prescribed diet heart healthy Reviewed medications with patient and discussed importance of compliance. 07-10-2021: Review and education given. Provided [name] with the [facility] number so he could reach out for help and assistance. 09-22-2021: [name] continues to help with medications management. States the patient is being compliant at this time.;  Discussed plans with patient for ongoing care management follow up and provided patient with direct contact information for care management team; Advised patient, providing education and rationale, to monitor blood pressure daily and record, calling PCP for findings outside established parameters;  Provided education on prescribed diet heart healthy;  Discussed complications of poorly controlled blood pressure such as heart disease, stroke, circulatory complications, vision complications, kidney impairment, sexual dysfunction;   Patient Goals/Self-Care Activities: Take medications as prescribed   Attend all scheduled provider appointments Call pharmacy for medication refills 3-7 days in advance of running out of medications Attend church or other social activities Perform all self care activities independently  Perform IADL's (shopping, preparing meals, housekeeping,  managing finances) independently Call provider office for new concerns or questions  Work with the social worker to address care coordination needs and will continue to work with the clinical team to address health care and disease management related needs call the Suicide and Crisis Lifeline: 988 call the [place] National Suicide Prevention Lifeline: 480-557-1109 or TTY: 8326238285 TTY 936-638-0834) to talk to a trained counselor call 1-800-273-TALK (toll free, 24 hour hotline) if experiencing a Mental Health or [place]  check blood pressure weekly choose a place to take my blood pressure (home, clinic or office, retail store) write blood pressure results in a log or diary learn about high blood pressure keep a blood pressure log take blood pressure log to all doctor appointments call doctor for signs and symptoms of high blood pressure develop an action plan for high blood pressure keep all doctor appointments take medications for blood pressure exactly as prescribed report new symptoms to your doctor eat more whole grains, fruits and vegetables, lean meats and healthy fats - call for medicine refill 2 or 3 days before it runs out - take all medications exactly as prescribed - call doctor with any symptoms you believe are related to your medicine - call doctor when you experience any new symptoms - go to all doctor appointments as scheduled - adhere to prescribed diet: heart healthy diet       Plan:Telephone follow up appointment with care management team member scheduled for:  11-24-2021 at 230 pm  [place], MSN, [place] Mobile: [phone]

## 2021-10-15 ENCOUNTER — Ambulatory Visit: Payer: Medicare HMO | Admitting: Pharmacist

## 2021-10-15 DIAGNOSIS — I129 Hypertensive chronic kidney disease with stage 1 through stage 4 chronic kidney disease, or unspecified chronic kidney disease: Secondary | ICD-10-CM

## 2021-10-15 NOTE — Chronic Care Management (AMB) (Signed)
Chronic Care Management CCM Pharmacy Note  10/15/2021 Name:  [name] MRN:  [id number] DOB:  11-07-1934   Subjective: [name] is an 87 y.o. year old male who is a primary patient of [name], DO.  The CCM team was consulted for assistance with disease management and care coordination needs.    Engaged with patient by telephone for follow up visit for pharmacy case management and/or care coordination services.   Objective:  Medications Reviewed Today     Reviewed by [name], RN (Case Manager) on 09/22/21 at 62  Med List Status: <None>   Medication Order Taking? Sig Documenting Provider Last Dose Status Informant  aspirin EC 81 MG tablet [id number] No Take 1 tablet (81 mg total) by mouth in the morning. (blood thinner) Karamalegos, Devonne Doughty, DO Taking Active   atorvastatin (LIPITOR) 40 MG tablet [id number] No Take 1 tablet (40 mg total) by mouth at bedtime. (Cholesterol) [name], DO Taking Active   clopidogrel (PLAVIX) 75 MG tablet [id number] No Take 1 tablet (75 mg total) by mouth in the morning. (blood thinner) [place], Devonne Doughty, DO Taking Active   finasteride (PROSCAR) 5 MG tablet [id number] No Take 1 tablet (5 mg total) by mouth daily. [name], DO Taking Active   lisinopril (ZESTRIL) 20 MG tablet [id number] No Take 1 tablet (20 mg total) by mouth in the morning. (Blood pressure) Karamalegos, Devonne Doughty, DO Taking Active   Multiple Vitamins-Minerals (MULTIVITAMIN ADULTS 50+ PO) [id number] No Take by mouth. [provider] Taking Active   Omega-3 1000 MG CAPS [id number] No Take 1 capsule by mouth in the morning. [provider] Taking Active   terazosin (HYTRIN) 5 MG capsule [id number] No Take 1 capsule (5 mg total) by mouth at bedtime. [name], DO Taking Active             Pertinent Labs:  Lab Results  Component Value Date   HGBA1C 5.9 (H) 08/11/2021   Lab Results   Component Value Date   CHOL 124 08/11/2021   HDL 40 08/11/2021   LDLCALC 52 08/11/2021   TRIG 268 (H) 08/11/2021   CHOLHDL 3.1 08/11/2021   Lab Results  Component Value Date   CREATININE 1.25 (H) 08/11/2021   BUN 29 (H) 08/11/2021   NA 141 08/11/2021   K 4.5 08/11/2021   CL 105 08/11/2021   CO2 33 (H) 08/11/2021   BP Readings from Last 3 Encounters:  09/16/21 (!) 146/67  08/11/21 132/70  05/12/21 (!) 151/65   Pulse Readings from Last 3 Encounters:  09/16/21 71  08/11/21 66  05/12/21 86     SDOH:  (Social Determinants of Health) assessments and interventions performed:    [place]  Review of patient past medical history, allergies, medications, health status, including review of consultants reports, laboratory and other test data, was performed as part of comprehensive evaluation and provision of chronic care management services.   Care Plan : PharmD - Med Mgmt  Updates made by Rennis Petty, RPH-CPP since 10/15/2021 12:00 AM     Problem: Disease Progression      Long-Range Goal: Disease Progression Prevented or Minimized   Start Date: 07/03/2020  Expected End Date: 10/01/2020  Recent Progress: On track  Priority: High  Note:   Current Barriers:  Unable to self-administer medications as prescribed Chronic Disease Management support and education needs related to memory loss and changes Lack of blood pressure readings for clinical  team Inconsistent engagement with care team/missed appointments  Pharmacist Clinical Goal(s):  Over the next 90 days, patient will achieve adherence to monitoring guidelines and medication adherence to achieve therapeutic efficacy through collaboration with PharmD and provider.   Interventions: 1:1 collaboration with [name], DO regarding development and update of comprehensive plan of care as evidenced by provider attestation and co-signature Inter-disciplinary care team collaboration (see longitudinal  plan of care) Today unable to reach patient by telephone. However, reach friend/caregiver, [name], by phone   Medication Adherence: Caregiver reports patient fill weekly pillboxes and caregiver checks behind (one for AM and one for PM) as well as check behind patient a couple of times/week to make sure taking medications consistently Remind caregiver to watch supply of medications and to order refills in time to arrive from mail order Discuss option of returning to using a pill packaging pharmacy  Hypertension: Current treatment: Lisinopril 20 mg QAM Terazosin 5 mg QHS Denies recent home blood pressure readings Denies patient reporting any symptoms of hypotension or hypertension Reports will help patient obtain an upper arm blood pressure monitor or obtain from health plan over the counter benefit Encouraged caregiver to monitor patient's home blood pressure, keeping log of results, bring record to medical appointments, but to call office sooner for readings outside of established parameters or if patient having symptoms  Patient Goals/Self-Care Activities Over the next 90 days, patient will:  - focus on medication adherence by using weekly pillbox - attend medical appointment as scheduled        Plan: Telephone follow up appointment with care management team member scheduled for:  9/27 at 10:45 am  Wallace Cullens, PharmD, [place], CPP Clinical Pharmacist [place] [phone]

## 2021-10-15 NOTE — Patient Instructions (Signed)
Visit Information  Thank you for taking time to visit with me today. Please don't hesitate to contact me if I can be of assistance to you before our next scheduled telephone appointment.  Following are the goals we discussed today:   Goals Addressed             This Visit's Progress    Pharmacy Goals       Our goal bad cholesterol, or LDL, is less than 70 . This is why it is important to continue taking your atorvastatin.  Please check your home blood pressure, keep a log of the results and bring this with you to your medical appointments.  Feel free to call me with any questions or concerns. I look forward to our next call!   Wallace Cullens, PharmD, [place] [phone]         Our next appointment is by telephone on 9/27 at 10:45 am  Please call the care guide team at [phone] if you need to cancel or reschedule your appointment.    Caregiver verbalized understanding of instructions, educational materials, and care plan provided today and DECLINED offer to receive copy of patient instructions, educational materials, and care plan.

## 2021-11-11 ENCOUNTER — Ambulatory Visit (INDEPENDENT_AMBULATORY_CARE_PROVIDER_SITE_OTHER): Payer: Medicare HMO | Admitting: Family Medicine

## 2021-11-11 ENCOUNTER — Other Ambulatory Visit: Payer: Self-pay | Admitting: Family Medicine

## 2021-11-11 ENCOUNTER — Encounter: Payer: Self-pay | Admitting: Family Medicine

## 2021-11-11 VITALS — BP 120/62 | HR 59 | Ht 70.0 in | Wt 232.6 lb

## 2021-11-11 DIAGNOSIS — E782 Mixed hyperlipidemia: Secondary | ICD-10-CM

## 2021-11-11 DIAGNOSIS — D692 Other nonthrombocytopenic purpura: Secondary | ICD-10-CM

## 2021-11-11 DIAGNOSIS — I10 Essential (primary) hypertension: Secondary | ICD-10-CM

## 2021-11-11 DIAGNOSIS — I6930 Unspecified sequelae of cerebral infarction: Secondary | ICD-10-CM

## 2021-11-11 DIAGNOSIS — N138 Other obstructive and reflux uropathy: Secondary | ICD-10-CM

## 2021-11-11 NOTE — Progress Notes (Signed)
Subjective:    Patient ID: [name], male    DOB: 04/07/1935, 87 y.o.   MRN: [id number]  [name] is a 87 y.o. male presenting on 11/11/2021 for Bleeding/Bruising   HPI  Senile Purpura Episodic Easy Bruising / Forearms On antiplatelet medications Aspirin 81 and Plavix daily. Admits adhering to meds. He says if any minor injury or bump he will bruise on his arms, has a few places right now. No open ulceration or bleeding problem.  Denies blood in stool or urine  History of CVA / Stroke.       11/11/2021    9:56 AM 01/21/2021   11:06 AM 09/30/2020    9:42 AM  Depression screen PHQ 2/9  Decreased Interest 0 0 0  Down, Depressed, Hopeless 0 0 0  PHQ - 2 Score 0 0 0  Altered sleeping 0    Tired, decreased energy 0    Change in appetite 0    Feeling bad or failure about yourself  0    Trouble concentrating 0    Moving slowly or fidgety/restless 0    Suicidal thoughts 0    PHQ-9 Score 0    Difficult doing work/chores Not difficult at all      Social History   Tobacco Use   Smoking status: Former    Types: Cigarettes    Quit date: 1985    Years since quitting: 38.5    Passive exposure: Past   Smokeless tobacco: Former  Scientific laboratory technician Use: Never used  Substance Use Topics   Alcohol use: No   Drug use: No    Review of Systems Per HPI unless specifically indicated above     Objective:    BP 120/62   Pulse (!) 59   Ht '5\' 10"'$  (1.778 m)   Wt 232 lb 9.6 oz (105.5 kg)   SpO2 98%   BMI 33.37 kg/m   Wt Readings from Last 3 Encounters:  11/11/21 232 lb 9.6 oz (105.5 kg)  09/16/21 234 lb (106.1 kg)  08/11/21 234 lb 6.4 oz (106.3 kg)    Physical Exam Vitals and nursing note reviewed.  Constitutional:      General: He is not in acute distress.    Appearance: Normal appearance. He is well-developed. He is not diaphoretic.     Comments: Well-appearing, comfortable, cooperative  HENT:     Head: Normocephalic and atraumatic.  Eyes:     General:         Right eye: No discharge.        Left eye: No discharge.     Conjunctiva/sclera: Conjunctivae normal.  Cardiovascular:     Rate and Rhythm: Normal rate.  Pulmonary:     Effort: Pulmonary effort is normal.  Skin:    General: Skin is warm and dry.     Findings: Bruising (scattered bruises on forearms only) present. No erythema or rash.  Neurological:     Mental Status: He is alert and oriented to person, place, and time.  Psychiatric:        Mood and Affect: Mood normal.        Behavior: Behavior normal.        Thought Content: Thought content normal.     Comments: Well groomed, good eye contact, normal speech and thoughts       Results for orders placed or performed during the hospital encounter of 09/16/21  Urinalysis, Complete w Microscopic  Result Value Ref Range  Color, Urine YELLOW YELLOW   APPearance CLEAR CLEAR   Specific Gravity, Urine 1.020 1.005 - 1.030   pH 7.0 5.0 - 8.0   Glucose, UA NEGATIVE NEGATIVE mg/dL   Hgb urine dipstick NEGATIVE NEGATIVE   Bilirubin Urine NEGATIVE NEGATIVE   Ketones, ur NEGATIVE NEGATIVE mg/dL   Protein, ur NEGATIVE NEGATIVE mg/dL   Nitrite NEGATIVE NEGATIVE   Leukocytes,Ua NEGATIVE NEGATIVE   Squamous Epithelial / LPF NONE SEEN 0 - 5   WBC, UA 0-5 0 - 5 WBC/hpf   RBC / HPF NONE SEEN 0 - 5 RBC/hpf   Bacteria, UA NONE SEEN NONE SEEN      Assessment & Plan:   Problem List Items Addressed This Visit     History of cerebrovascular accident (CVA) with residual deficit   Other Visit Diagnoses     Senile purpura (HCC)    -  Primary   Relevant Medications   rosuvastatin (CRESTOR) 20 MG tablet   simvastatin (ZOCOR) 20 MG tablet   terazosin (HYTRIN) 10 MG capsule       Reassurance today Reviewed cause of his easy bruising, likely DAPT with aspirin plavix. He is okay to keep taking medications given his history of stroke. No other concerns for more advanced bleeding or other symptoms He will follow up if new concern  No orders  of the defined types were placed in this encounter.   Follow up plan: Return in about 6 months (around 05/14/2022) for 6 month follow-up.   Nobie Putnam, [facility] 11/11/2021, 10:05 AM

## 2021-11-11 NOTE — Patient Instructions (Addendum)
Thank you for coming to the office today.  Bruises on arms is going to be very common due to thinner skin and blood thinner medications (Aspirin, Plavix).  Keep taking these medications, but be careful with the skin or this can keep happening.  It may happen even if nothing is provoking it.  You can use neosporin topical if you have a bump or bruise or injury.   Please schedule a Follow-up Appointment to: Return in about 6 months (around 05/14/2022) for 6 month follow-up.  If you have any other questions or concerns, please feel free to call the office or send a message through [place]. You may also schedule an earlier appointment if necessary.  Additionally, you may be receiving a survey about your experience at our office within a few days to 1 week by e-mail or mail. We value your feedback.  Nobie Putnam, DO [place]

## 2021-11-12 NOTE — Telephone Encounter (Signed)
Requested Prescriptions  Pending Prescriptions Disp Refills  . finasteride (PROSCAR) 5 MG tablet [Pharmacy Med Name: FINASTERIDE 5 MG Tablet] 90 tablet 0    Sig: TAKE 1 TABLET EVERY DAY     Urology: 5-alpha Reductase Inhibitors Failed - 11/11/2021 12:38 PM      Failed - PSA in normal range and within 360 days    PSA  Date Value Ref Range Status  06/13/2020 24.34 (H) < OR = 4.0 ng/mL Final    Comment:    The total PSA value from this assay system is  standardized against the WHO standard. The test  result will be approximately 20% lower when compared  to the equimolar-standardized total PSA (Beckman  Coulter). Comparison of serial PSA results should be  interpreted with this fact in mind. . This test was performed using the Siemens  chemiluminescent method. Values obtained from  different assay methods cannot be used interchangeably. PSA levels, regardless of value, should not be interpreted as absolute evidence of the presence or absence of disease.    Prostate Specific Ag, Serum  Date Value Ref Range Status  11/08/2018 17.4 (H) 0.0 - 4.0 ng/mL Final    Comment:    Roche ECLIA methodology. According to the American Urological Association, Serum PSA should decrease and remain at undetectable levels after radical prostatectomy. The AUA defines biochemical recurrence as an initial PSA value 0.2 ng/mL or greater followed by a subsequent confirmatory PSA value 0.2 ng/mL or greater. Values obtained with different assay methods or kits cannot be used interchangeably. Results cannot be interpreted as absolute evidence of the presence or absence of malignant disease.          Passed - Valid encounter within last 12 months    Recent Outpatient Visits          Yesterday Senile purpura [facility])   [facility], Devonne Doughty, DO   3 months ago Benign hypertension with CKD (chronic kidney disease) stage III [facility])   [facility],  Devonne Doughty, DO   9 months ago Benign hypertension with CKD (chronic kidney disease) stage III [facility])   [facility] [name], DO   1 year ago Benign hypertension with CKD (chronic kidney disease) stage III [facility])   [facility] [name], DO   1 year ago Cognitive deficit as late effect of cerebrovascular accident (CVA)   [facility] [name], DO      Future Appointments            In 2 months  [facility] [place]   In 4 months [place], Gordan Payment [place]           . lisinopril (ZESTRIL) 20 MG tablet [Pharmacy Med Name: LISINOPRIL 20 MG Tablet] 90 tablet 0    Sig: TAKE 1 TABLET EVERY MORNING (BLOOD PRESSURE)     Cardiovascular:  ACE Inhibitors Failed - 11/11/2021 12:38 PM      Failed - Cr in normal range and within 180 days    Creat  Date Value Ref Range Status  08/11/2021 1.25 (H) 0.70 - 1.22 mg/dL Final         Passed - K in normal range and within 180 days    Potassium  Date Value Ref Range Status  08/11/2021 4.5 3.5 - 5.3 mmol/L Final  07/18/2013 4.0 3.5 - 5.1 mmol/L Final  Passed - Patient is not pregnant      Passed - Last BP in normal range    BP Readings from Last 1 Encounters:  11/11/21 120/62         Passed - Valid encounter within last 6 months    Recent Outpatient Visits          Yesterday Senile purpura [facility])   [facility], Devonne Doughty, DO   3 months ago Benign hypertension with CKD (chronic kidney disease) stage III [facility])   [facility], Devonne Doughty, DO   9 months ago Benign hypertension with CKD (chronic kidney disease) stage III [facility])   [facility] [name], DO   1 year ago Benign hypertension with CKD (chronic kidney disease) stage III [facility])   [facility] [name], DO   1 year ago  Cognitive deficit as late effect of cerebrovascular accident (CVA)   [facility] [name], DO      Future Appointments            In 2 months  [facility] [place]   In 4 months McGowan, Gordan Payment [place]           . atorvastatin (LIPITOR) 40 MG tablet [Pharmacy Med Name: ATORVASTATIN CALCIUM 40 MG Tablet] 90 tablet 0    Sig: TAKE 1 TABLET AT BEDTIME (CHOLESTEROL)     Cardiovascular:  Antilipid - Statins Failed - 11/11/2021 12:38 PM      Failed - Lipid Panel in normal range within the last 12 months    Cholesterol  Date Value Ref Range Status  08/11/2021 124 <200 mg/dL Final   LDL Cholesterol (Calc)  Date Value Ref Range Status  08/11/2021 52 mg/dL (calc) Final    Comment:    Reference range: <100 . Desirable range <100 mg/dL for primary prevention;   <70 mg/dL for patients with CHD or diabetic patients  with > or = 2 CHD risk factors. Marland Kitchen LDL-C is now calculated using the Martin-Hopkins  calculation, which is a validated novel method providing  better accuracy than the Friedewald equation in the  estimation of LDL-C.  Cresenciano Genre et al. Annamaria Helling. 7510;258(52): 2061-2068  (http://education.Quest[employer].com/faq/FAQ164)    HDL  Date Value Ref Range Status  08/11/2021 40 > OR = 40 mg/dL Final   Triglycerides  Date Value Ref Range Status  08/11/2021 268 (H) <150 mg/dL Final    Comment:    . If a non-fasting specimen was collected, consider repeat triglyceride testing on a fasting specimen if clinically indicated.  Yates Decamp et al. J. of Clin. Lipidol. 7782;4:235-361. Marland Kitchen          Passed - Patient is not pregnant      Passed - Valid encounter within last 12 months    Recent Outpatient Visits          Yesterday Senile purpura [facility] [place])   [facility] [name], DO   3 months ago Benign hypertension with CKD (chronic kidney disease) stage III [facility])   [facility], Devonne Doughty, DO   9 months ago Benign hypertension with CKD (chronic kidney disease) stage III [facility])   [place], DO   1 year ago Benign hypertension with CKD (chronic kidney disease) stage III [facility])   [place], DO   1  year ago Cognitive deficit as late effect of cerebrovascular accident (CVA)   [facility] [name], DO      Future Appointments            In 2 months  [facility] [place]   In 4 months McGowan, Gordan Payment [facility]

## 2021-11-24 ENCOUNTER — Telehealth: Payer: Medicare HMO

## 2021-11-24 ENCOUNTER — Ambulatory Visit: Payer: Self-pay

## 2021-11-24 NOTE — Patient Outreach (Signed)
  Care Coordination   Follow Up Visit Note   11/24/2021 Name: [name] MRN: [id number] DOB: 1934-07-15  [name] is a 87 y.o. year old male who sees [name], DO for primary care. I spoke with  [name] by phone today. Also spoke with [name], friend and DRP to the patient  What matters to the patients health and wellness today?  Having a hard time remembering anything. I need a new "87 year old body"    Goals Addressed             This Visit's Progress    RNCM: Effective Management of Memory Loss       Care Coordination Interventions: Evaluation of current treatment plan related to memory loss and patient's adherence to plan as established by provider Advised patient to call the office for changes in his health and well being and to reach out for help with community resources. Also reached out to [name] the patients friend who assist with helping the patient and the he says for the most part the patient is doing well at this time.  Provided education to patient re: keeping a routine schedule, making sure to take h is medications as directed and continue to work with the [facility] and team to effectively manage his health and well being Reviewed medications with patient and discussed compliance. 11-24-2021: The patient states that he is compliant with his medications even though he does not feel they are helping him. Education on the benefits of taking his medications and not to stop. The patient agrees to continue to take his medications. Has had episodes in the past of not taking his medications in the past. The patient also works with the pharm D on a regular basis. Education and support given.  Provided patient and/or caregiver with resources available in the community information about resources available to the patient if needed Advice worker) Provided patient with ways to enhance memory recall educational materials related to memory loss Reviewed  scheduled/upcoming provider appointments including sees pcp and specialist on a regular basis Discussed plans with patient for ongoing care management follow up and provided patient with direct contact information for care management team Advised patient to discuss changes in his memory and other concerns about his chronic conditions with provider Screening for signs and symptoms of depression related to chronic disease state  Assessed social determinant of health barriers AWV is scheduled with the nurse for 01-27-2022           SDOH assessments and interventions completed:  Yes  SDOH Interventions Today    Flowsheet Row Most Recent Value  SDOH Interventions   Food Insecurity Interventions Intervention Not Indicated  Financial Strain Interventions Intervention Not Indicated  Housing Interventions Intervention Not Indicated  Physical Activity Interventions Intervention Not Indicated, Other (Comments)  [the patient is very active with being outside and mowing yard and doing things in his garden]  Stress Interventions Intervention Not Indicated  Social Connections Interventions Intervention Not Indicated  Transportation Interventions Intervention Not Indicated        Care Coordination Interventions Activated:  Yes  Care Coordination Interventions:  Yes, provided   Follow up plan: Follow up call scheduled for 02-09-2022 at 330 pm    Encounter Outcome:  Pt. Visit Completed   [name] RN, MSN, [place] [facility] Mobile: [phone]

## 2021-11-24 NOTE — Patient Instructions (Signed)
Visit Information  Thank you for taking time to visit with me today. Please don't hesitate to contact me if I can be of assistance to you.   Following are the goals we discussed today:   Goals Addressed             This Visit's Progress    RNCM: Effective Management of Memory Loss       Care Coordination Interventions: Evaluation of current treatment plan related to memory loss and patient's adherence to plan as established by provider Advised patient to call the office for changes in his health and well being and to reach out for help with community resources. Also reached out to [name] the patients friend who assist with helping the patient and the he says for the most part the patient is doing well at this time.  Provided education to patient re: keeping a routine schedule, making sure to take h is medications as directed and continue to work with the [facility] and team to effectively manage his health and well being Reviewed medications with patient and discussed compliance. 11-24-2021: The patient states that he is compliant with his medications even though he does not feel they are helping him. Education on the benefits of taking his medications and not to stop. The patient agrees to continue to take his medications. Has had episodes in the past of not taking his medications in the past. The patient also works with the pharm D on a regular basis. Education and support given.  Provided patient and/or caregiver with resources available in the community information about resources available to the patient if needed ([employer]) Provided patient with ways to enhance memory recall educational materials related to memory loss Reviewed scheduled/upcoming provider appointments including sees pcp and specialist on a regular basis Discussed plans with patient for ongoing care management follow up and provided patient with direct contact information for care management team Advised patient to  discuss changes in his memory and other concerns about his chronic conditions with provider Screening for signs and symptoms of depression related to chronic disease state  Assessed social determinant of health barriers AWV is scheduled with the nurse for 01-27-2022           Our next appointment is by telephone on 02-09-2022 at 330 pm  Please call the care guide team at [phone] if you need to cancel or reschedule your appointment.   If you are experiencing a Mental Health or [place] or need someone to talk to, please call the Suicide and Crisis Lifeline: 988 call the [place] National Suicide Prevention Lifeline: 614-594-6982 or TTY: 501-032-4065 TTY 507-014-7341) to talk to a trained counselor call 1-800-273-TALK (toll free, 24 hour hotline)  The patient verbalized understanding of instructions, educational materials, and care plan provided today and DECLINED offer to receive copy of patient instructions, educational materials, and care plan.   Telephone follow up appointment with care management team member scheduled for: 02-09-2022 at 330 pm  [place], MSN, [facility] Mobile: [phone]

## 2021-12-05 ENCOUNTER — Ambulatory Visit: Payer: Self-pay | Admitting: *Deleted

## 2021-12-05 NOTE — Telephone Encounter (Signed)
Summary: medication clarification   Pt called saying he has a blood pressure pill and a blood thinner.  He is confused as to what he is supposed to take   CN  [phone]     Attempted to call patient- no answer- call can not be completed at this time message.

## 2021-12-05 NOTE — Telephone Encounter (Signed)
Reason for Disposition . Caller has medicine question only, adult not sick, AND triager answers question  Answer Assessment - Initial Assessment Questions 1. NAME of MEDICINE: "What medicine(s) are you calling about?"     Lisinopril and Clopidogrel  2. QUESTION: "What is your question?" (e.g., double dose of medicine, side effect)     Patient wants to know if he is supposed to take both of these medications 3. PRESCRIBER: "Who prescribed the medicine?" Reason: if prescribed by specialist, call should be referred to that group.     PCP Patient advised per medication list- yes Purpose: blood thinner, BP control  Protocols used: Medication Question Call-A-AH

## 2022-01-14 ENCOUNTER — Ambulatory Visit: Payer: Medicare HMO | Admitting: Pharmacist

## 2022-01-14 DIAGNOSIS — I129 Hypertensive chronic kidney disease with stage 1 through stage 4 chronic kidney disease, or unspecified chronic kidney disease: Secondary | ICD-10-CM

## 2022-01-14 DIAGNOSIS — E782 Mixed hyperlipidemia: Secondary | ICD-10-CM

## 2022-01-14 NOTE — Chronic Care Management (AMB) (Signed)
Chief Complaint  Patient presents with   Care Coordination    HTN, HLD, medication adherence    [name] is a 87 y.o. year old male who presented for a telephone visit.   They were referred to the pharmacist by their PCP for assistance in managing hypertension, hyperlipidemia, and medication adherence .    Subjective:  Care Team: Primary Care Provider: [name], DO Urology: [facility]  Medication Access/Adherence  Patient reports affordability concerns with their medications: No  Patient reports access/transportation concerns to their pharmacy: No  Patient reports adherence concerns with their medications:  No  Reports uses weekly pillbox and his schedule to aid with consistency   Medication Management:  Current adherence strategy: patient fills weekly pillboxes and caregiver checks behind patient a couple of times/week to make sure taking medications consistently Remind patient/caregiver to watch supply of medications and to order refills in time to arrive from mail order Discuss option of returning to using a pill packaging pharmacy  Patient reports Good adherence to medications  Patient reports the following barriers to adherence: None  Recent fill dates:  -Atorvastatin 40 mg, finasteride 5 mg, lisinopril 20 mg and terazosin 5 mg Rxs filled on 11/13/2021 for 90 day supplies -Clopidogrel 75 mg 10/31/2021 for 90 day supply  Hypertension: Current treatment: Lisinopril 20 mg QAM Terazosin 5 mg QHS Denies recent home blood pressure readings Patient reports current home BP monitor not currently working, but will ask caregiver to help troubleshoot problem Encouraged caregiver to monitor patient's home blood pressure, keeping log of results, bring record to medical appointments, but to call office sooner for readings outside of established parameters or if patient having symptoms   Objective:  Lab Results  Component Value Date    CREATININE 1.25 (H) 08/11/2021   BUN 29 (H) 08/11/2021   NA 141 08/11/2021   K 4.5 08/11/2021   CL 105 08/11/2021   CO2 33 (H) 08/11/2021    Lab Results  Component Value Date   CHOL 124 08/11/2021   HDL 40 08/11/2021   LDLCALC 52 08/11/2021   TRIG 268 (H) 08/11/2021   CHOLHDL 3.1 08/11/2021   BP Readings from Last 3 Encounters:  11/11/21 120/62  09/16/21 (!) 146/67  08/11/21 132/70   Pulse Readings from Last 3 Encounters:  11/11/21 (!) 59  09/16/21 71  08/11/21 66     Medications Reviewed Today     Reviewed by Rennis Petty, RPH-CPP (Pharmacist) on 01/14/22 at 1138  Med List Status: <None>   Medication Order Taking? Sig Documenting Provider Last Dose Status Informant  aspirin EC 81 MG tablet [id number] No Take 1 tablet (81 mg total) by mouth in the morning. (blood thinner) [place], Devonne Doughty, DO Taking Active   atorvastatin (LIPITOR) 40 MG tablet [id number]  TAKE 1 TABLET AT BEDTIME (CHOLESTEROL) [name], DO  Active   clopidogrel (PLAVIX) 75 MG tablet [id number] No Take 1 tablet (75 mg total) by mouth in the morning. (blood thinner) [name], DO Taking Active   finasteride (PROSCAR) 5 MG tablet [id number]  TAKE 1 TABLET EVERY DAY [name], DO  Active   lisinopril (ZESTRIL) 20 MG tablet [id number]  TAKE 1 TABLET EVERY MORNING (BLOOD PRESSURE) [name], DO  Active   Multiple Vitamins-Minerals (MULTIVITAMIN ADULTS 50+ PO) [id number] No Take by mouth. [provider] Taking Active   Omega-3 1000 MG CAPS [id number] No Take 1 capsule by mouth in the morning. [provider] Taking Active   terazosin (HYTRIN) 10 MG capsule [id number] No Take 10 mg by mouth at bedtime. [provider] Taking Active               Assessment/Plan:   Medication Management: - Currently strategy sufficient to maintain appropriate adherence to prescribed medication regimen, with assistance from  friend/caregiver, [name] per patient and caregiver report - Patient to continue to use weekly pill box to organize medications - Discussed collaboration with local pharmacies for adherence packaging. Reviewed local pharmacies with adherence packaging options. Patient elects to continue with weekly pillbox for now  Follow Up Plan: CM Pharmacist will follow up with patient by telephone within the next 3 months  Wallace Cullens, PharmD, [place], [facility] [place] [phone]

## 2022-01-14 NOTE — Patient Instructions (Signed)
Goals Addressed             This Visit's Progress    Pharmacy Goals       Our goal bad cholesterol, or LDL, is less than 70 . This is why it is important to continue taking your atorvastatin.  Please check your home blood pressure, keep a log of the results and bring this with you to your medical appointments.  Feel free to call me with any questions or concerns. I look forward to our next call!  Wallace Cullens, PharmD, [place] [phone]

## 2022-01-15 ENCOUNTER — Other Ambulatory Visit: Payer: Self-pay | Admitting: Family Medicine

## 2022-01-15 DIAGNOSIS — I69319 Unspecified symptoms and signs involving cognitive functions following cerebral infarction: Secondary | ICD-10-CM

## 2022-01-15 DIAGNOSIS — I129 Hypertensive chronic kidney disease with stage 1 through stage 4 chronic kidney disease, or unspecified chronic kidney disease: Secondary | ICD-10-CM

## 2022-01-15 DIAGNOSIS — I6930 Unspecified sequelae of cerebral infarction: Secondary | ICD-10-CM

## 2022-01-27 ENCOUNTER — Ambulatory Visit: Payer: Medicare Other

## 2022-01-30 ENCOUNTER — Ambulatory Visit: Payer: Medicare Other

## 2022-02-02 ENCOUNTER — Ambulatory Visit: Payer: Medicare HMO

## 2022-02-05 ENCOUNTER — Ambulatory Visit: Payer: Medicare HMO | Admitting: Family Medicine

## 2022-02-09 ENCOUNTER — Ambulatory Visit (INDEPENDENT_AMBULATORY_CARE_PROVIDER_SITE_OTHER): Payer: PPO

## 2022-02-09 ENCOUNTER — Telehealth: Payer: TRICARE For Life (TFL)

## 2022-02-09 DIAGNOSIS — I129 Hypertensive chronic kidney disease with stage 1 through stage 4 chronic kidney disease, or unspecified chronic kidney disease: Secondary | ICD-10-CM

## 2022-02-09 DIAGNOSIS — I6930 Unspecified sequelae of cerebral infarction: Secondary | ICD-10-CM

## 2022-02-09 DIAGNOSIS — I10 Essential (primary) hypertension: Secondary | ICD-10-CM

## 2022-02-09 NOTE — Patient Instructions (Signed)
Please call the care guide team at [phone] if you need to cancel or reschedule your appointment.   If you are experiencing a Mental Health or [place] or need someone to talk to, please call the Suicide and Crisis Lifeline: 988 call the [place] National Suicide Prevention Lifeline: 772 156 5996 or TTY: 484 321 0597 TTY (780)551-5236) to talk to a trained counselor call 1-800-273-TALK (toll free, 24 hour hotline)   Following is a copy of your full provider care plan:   Goals Addressed             This Visit's Progress    COMPLETED: CCM Expected Outcome:  Monitor, Self-Manage and Reduce Symptoms of [place] Ischemic/TIA       Cognitive decline secondary to CVA     CCM Expected Outcome:  Monitor, Self-Manage and Reduce Symptoms of [place] Ischemic/TIA       Current Barriers:  Knowledge Deficits related to memory loss and following the plan of care related to past CVA/TIA Care Coordination needs related to ongoing support and education needs  in a patient with history of CVA with memory loss. Chronic Disease Management support and education needs related to effective management of post CVA with memory loss  Lacks caregiver support.  Non-adherence to scheduled provider appointments Non-adherence to prescribed medication regimen Cognitive Deficits  Planned Interventions: Evaluation of current treatment plan related to effective management of past history of CVA and TIA with memory loss, especially short term memory loss and patient's adherence to plan as established by provider Advised patient to call the office for changes in mood, anxiety, depression, new questions and concerns related to past history of stroke with memory loss Provided education to patient re: calling for changes in his condition or new concerns related to his health and well being Reviewed medications with patient and discussed compliance. The patient is compliant with his medications. Has had issues in  the past with managing his medications but states he is compliant at this time Provided patient with doing crossword puzzles, memory recall activities and creating routines  educational materials related to history of CVA with memory loss and effective management of deficits related to memory loss. Reviewed scheduled/upcoming provider appointments including 05-11-2021 at 0820 am Discussed plans with patient for ongoing care management follow up and provided patient with direct contact information for care management team Advised patient to discuss changes in his memory  with provider  Symptom Management: Take medications as prescribed   Attend all scheduled provider appointments Perform all self care activities independently  Call provider office for new concerns or questions  call the Suicide and Crisis Lifeline: 988 call the [place] National Suicide Prevention Lifeline: 910-007-5568 or TTY: (740)818-2435 TTY 4193659890) to talk to a trained counselor call 1-800-273-TALK (toll free, 24 hour hotline) if experiencing a Mental Health or [place]   Follow Up Plan: Telephone follow up appointment with care management team member scheduled for: 03-17-2022 at 71 pm        CCM Expected Outcome:  Monitor, Self-Manage, and Reduce Symptoms of Hypertension       Current Barriers:  Knowledge Deficits related to effective management of HTN Care Coordination needs related to ongoing support and education needs  in a patient with HTN Chronic Disease Management support and education needs related to effective management of HTN Lacks caregiver support.  Non-adherence to scheduled provider appointments Non-adherence to prescribed medication regimen Cognitive Deficits  BP Readings from Last 3 Encounters:  11/11/21 120/62  09/16/21 (!) 146/67  08/11/21  132/70     Planned Interventions: Evaluation of current treatment plan related to hypertension self management and patient's  adherence to plan as established by provider;   Provided education to patient re: stroke prevention, s/s of heart attack and stroke; Reviewed prescribed diet heart healthy diet  Reviewed medications with patient and discussed importance of compliance;  Discussed plans with patient for ongoing care management follow up and provided patient with direct contact information for care management team; Advised patient, providing education and rationale, to monitor blood pressure daily and record, calling PCP for findings outside established parameters;  Reviewed scheduled/upcoming provider appointments including: 05-11-2021 at [place] am Advised patient to discuss changes in blood pressures or heart health with provider; Provided education on prescribed diet heart healthy;  Discussed complications of poorly controlled blood pressure such as heart disease, stroke, circulatory complications, vision complications, kidney impairment, sexual dysfunction;  Screening for signs and symptoms of depression related to chronic disease state;  Assessed social determinant of health barriers;   Symptom Management: Take medications as prescribed   Attend all scheduled provider appointments Call pharmacy for medication refills 3-7 days in advance of running out of medications Call provider office for new concerns or questions  Work with the social worker to address care coordination needs and will continue to work with the clinical team to address health care and disease management related needs call the Suicide and Crisis Lifeline: 988 call the [place] National Suicide Prevention Lifeline: (856)409-1870 or TTY: 380-165-0975 TTY (938) 348-7606) to talk to a trained counselor call 1-800-273-TALK (toll free, 24 hour hotline) if experiencing a Mental Health or [place] about high blood pressure call doctor for signs and symptoms of high blood pressure develop an action plan for high blood pressure keep  all doctor appointments take medications for blood pressure exactly as prescribed report new symptoms to your doctor eat more whole grains, fruits and vegetables, lean meats and healthy fats  Follow Up Plan: Telephone follow up appointment with care management team member scheduled for:03-17-2022 at 345 pm          The patient verbalized understanding of instructions, educational materials, and care plan provided today and DECLINED offer to receive copy of patient instructions, educational materials, and care plan.   Telephone follow up appointment with care management team member scheduled for: 03-17-2022 at 345 pm

## 2022-02-09 NOTE — Chronic Care Management (AMB) (Signed)
CCM RN Visit Note  02-09-2022 Name: [name] MRN: [id number]      DOB: 01-28-1935  Subjective: [name] is a 87 y.o. year old male who is a primary care patient of [name], DO. The patient was referred to the Chronic Care Management team for assistance with care management needs subsequent to provider initiation of CCM services and plan of care.      Today's Visit: Engaged with patient by telephone for initial visit.   SDOH Interventions Today    Flowsheet Row Most Recent Value  SDOH Interventions   Utilities Interventions Intervention Not Indicated        Goals Addressed             This Visit's Progress    COMPLETED: CCM Expected Outcome:  Monitor, Self-Manage and Reduce Symptoms of [place] Ischemic/TIA       Cognitive decline secondary to CVA     CCM Expected Outcome:  Monitor, Self-Manage and Reduce Symptoms of [place] Ischemic/TIA       Current Barriers:  Knowledge Deficits related to memory loss and following the plan of care related to past CVA/TIA Care Coordination needs related to ongoing support and education needs  in a patient with history of CVA with memory loss. Chronic Disease Management support and education needs related to effective management of post CVA with memory loss  Lacks caregiver support.  Non-adherence to scheduled provider appointments Non-adherence to prescribed medication regimen Cognitive Deficits  Planned Interventions: Evaluation of current treatment plan related to effective management of past history of CVA and TIA with memory loss, especially short term memory loss and patient's adherence to plan as established by provider Advised patient to call the office for changes in mood, anxiety, depression, new questions and concerns related to past history of stroke with memory loss Provided education to patient re: calling for changes in his condition or new concerns related to his health and well being Reviewed  medications with patient and discussed compliance. The patient is compliant with his medications. Has had issues in the past with managing his medications but states he is compliant at this time Provided patient with doing crossword puzzles, memory recall activities and creating routines  educational materials related to history of CVA with memory loss and effective management of deficits related to memory loss. Reviewed scheduled/upcoming provider appointments including 05-11-2021 at 0820 am Discussed plans with patient for ongoing care management follow up and provided patient with direct contact information for care management team Advised patient to discuss changes in his memory  with provider  Symptom Management: Take medications as prescribed   Attend all scheduled provider appointments Perform all self care activities independently  Call provider office for new concerns or questions  call the Suicide and Crisis Lifeline: 988 call the [place] National Suicide Prevention Lifeline: 431-769-0451 or TTY: (310)883-2787 TTY (873)888-1381) to talk to a trained counselor call 1-800-273-TALK (toll free, 24 hour hotline) if experiencing a Mental Health or [place]   Follow Up Plan: Telephone follow up appointment with care management team member scheduled for: 03-17-2022 at 63 pm        CCM Expected Outcome:  Monitor, Self-Manage, and Reduce Symptoms of Hypertension       Current Barriers:  Knowledge Deficits related to effective management of HTN Care Coordination needs related to ongoing support and education needs  in a patient with HTN Chronic Disease Management support and education needs related to effective management of HTN Lacks  caregiver support.  Non-adherence to scheduled provider appointments Non-adherence to prescribed medication regimen Cognitive Deficits  BP Readings from Last 3 Encounters:  11/11/21 120/62  09/16/21 (!) 146/67  08/11/21 132/70     Planned  Interventions: Evaluation of current treatment plan related to hypertension self management and patient's adherence to plan as established by provider;   Provided education to patient re: stroke prevention, s/s of heart attack and stroke; Reviewed prescribed diet heart healthy diet  Reviewed medications with patient and discussed importance of compliance;  Discussed plans with patient for ongoing care management follow up and provided patient with direct contact information for care management team; Advised patient, providing education and rationale, to monitor blood pressure daily and record, calling PCP for findings outside established parameters;  Reviewed scheduled/upcoming provider appointments including: 05-11-2021 at [place] am Advised patient to discuss changes in blood pressures or heart health with provider; Provided education on prescribed diet heart healthy;  Discussed complications of poorly controlled blood pressure such as heart disease, stroke, circulatory complications, vision complications, kidney impairment, sexual dysfunction;  Screening for signs and symptoms of depression related to chronic disease state;  Assessed social determinant of health barriers;   Symptom Management: Take medications as prescribed   Attend all scheduled provider appointments Call pharmacy for medication refills 3-7 days in advance of running out of medications Call provider office for new concerns or questions  Work with the social worker to address care coordination needs and will continue to work with the clinical team to address health care and disease management related needs call the Suicide and Crisis Lifeline: 988 call the [place] Suicide Prevention Lifeline: (402) 293-3930 or TTY: (613) 706-1979 TTY 548-715-7277) to talk to a trained counselor call 1-800-273-TALK (toll free, 24 hour hotline) if experiencing a Mental Health or [place] about high blood pressure call  doctor for signs and symptoms of high blood pressure develop an action plan for high blood pressure keep all doctor appointments take medications for blood pressure exactly as prescribed report new symptoms to your doctor eat more whole grains, fruits and vegetables, lean meats and healthy fats  Follow Up Plan: Telephone follow up appointment with care management team member scheduled for:03-17-2022 at 345 pm          Plan:Telephone follow up appointment with care management team member scheduled for:  03-17-2022 at 345 pm   [place], MSN, CCM RN Care Manager  Chronic Care Management Direct Number: [phone]

## 2022-02-09 NOTE — Chronic Care Management (AMB) (Signed)
   Chronic Care Management Provider Comprehensive Care Plan    Name: [name] MRN: [id number] DOB: [birth year]  Referral to Chronic Care Management (CCM) services was placed by Provider:  Dr. Nobie Putnam on Date: 01-15-2022.  Interaction and coordination with outside resources, practitioners, and providers See CCM Referral  Chronic Condition 1: HTN Provider Assessment and Plan  Controlled currently on regimen. COMPLETE METABOLIC PANEL WITH GFR     CBC with Differential/Platelet     Expected Outcome/Goals Addressed This Visit: (Provider CCM Goals)/Provider Assessment and Plan  CCM (HYPERTENSION)  EXPECTED OUTCOME:  MONITOR,SELF- MANAGE AND REDUCE SYMPTOMS OF HYPERTENSION  Chronic Condition 2: Stroke Ischemic/TIA Provider Assessment and Plan History CVA Memory deficit Continue on DAPT therapy and med management Continue to work with CCM team to help improve outcomes, issues with adherence meds/apt in the past.     Expected Outcome/Goals Addressed This Visit: (Provider CCM Goals)/Provider Assessment and Plan   CCM Expected Outcome:  Monitor, Self-Manage and Reduce Symptoms of [place] Ischemic/TIA  Problem List Patient Active Problem List   Diagnosis Date Noted   Cognitive deficit as late effect of cerebrovascular accident (CVA) 06/25/2020   Essential hypertension 06/25/2020   Imbalance 02/01/2018   Humeral head fracture, left, closed, initial encounter 09/22/2017   History of cerebrovascular accident (CVA) with residual deficit 09/09/2017   CKD (chronic kidney disease), stage III ([place]) 09/01/2017   Elevated PSA, between 10 and less than 20 ng/ml 07/22/2016   Bilateral lower extremity edema 07/22/2016   Abnormal glucose 07/15/2016   BPH with obstruction/lower urinary tract symptoms 04/10/2016   Benign hypertension with CKD (chronic kidney disease) stage III ([place]) 04/10/2016   Memory loss 04/10/2016   Gastric outlet obstruction 12/02/2014   Generalized abdominal  pain    Benign fibroma of prostate 01/02/2011   HLD (hyperlipidemia) 01/02/2011   Obesity (BMI 30.0-34.9) 01/02/2011    Medication Management Outpatient Encounter Medications as of 02/09/2022  Medication Sig   aspirin EC 81 MG tablet Take 1 tablet (81 mg total) by mouth in the morning. (blood thinner)   atorvastatin (LIPITOR) 40 MG tablet TAKE 1 TABLET AT BEDTIME (CHOLESTEROL)   clopidogrel (PLAVIX) 75 MG tablet Take 1 tablet (75 mg total) by mouth in the morning. (blood thinner)   finasteride (PROSCAR) 5 MG tablet TAKE 1 TABLET EVERY DAY   lisinopril (ZESTRIL) 20 MG tablet TAKE 1 TABLET EVERY MORNING (BLOOD PRESSURE)   Multiple Vitamins-Minerals (MULTIVITAMIN ADULTS 50+ PO) Take by mouth.   Omega-3 1000 MG CAPS Take 1 capsule by mouth in the morning.   terazosin (HYTRIN) 10 MG capsule Take 10 mg by mouth at bedtime.   No facility-administered encounter medications on file as of 02/09/2022.    Cognitive Assessment Identity Confirmed: '[x]'$  Name; '[x]'$  DOB Cognitive Status: '[]'$  Normal '[x]'$  Abnormal  Functional Status Survey: Is the patient deaf or have difficulty hearing?: No Does the patient have difficulty seeing, even when wearing glasses/contacts?: No Does the patient have difficulty concentrating, remembering, or making decisions?: Yes (short term memory loss) Does the patient have difficulty walking or climbing stairs?: No Does the patient have difficulty dressing or bathing?: No Does the patient have difficulty doing errands alone such as visiting a doctor's office or shopping?: No  Caregiver Assessment  Current Living Arrangement: home Living arrangements - the patient lives with their spouse. Caregiver: [name], friend Caregiver Relation: Non-Family Status: Stable, limited availability to assist

## 2022-02-13 NOTE — Patient Instructions (Signed)
Health Maintenance, Male Adopting a healthy lifestyle and getting preventive care are important in promoting health and wellness. Ask your health care provider about: The right schedule for you to have regular tests and exams. Things you can do on your own to prevent diseases and keep yourself healthy. What should I know about diet, weight, and exercise? Eat a healthy diet  Eat a diet that includes plenty of vegetables, fruits, low-fat dairy products, and lean protein. Do not eat a lot of foods that are high in solid fats, added sugars, or sodium. Maintain a healthy weight Body mass index (BMI) is a measurement that can be used to identify possible weight problems. It estimates body fat based on height and weight. Your health care provider can help determine your BMI and help you achieve or maintain a healthy weight. Get regular exercise Get regular exercise. This is one of the most important things you can do for your health. Most adults should: Exercise for at least 150 minutes each week. The exercise should increase your heart rate and make you sweat (moderate-intensity exercise). Do strengthening exercises at least twice a week. This is in addition to the moderate-intensity exercise. Spend less time sitting. Even light physical activity can be beneficial. Watch cholesterol and blood lipids Have your blood tested for lipids and cholesterol at 85 years of age, then have this test every 5 years. You may need to have your cholesterol levels checked more often if: Your lipid or cholesterol levels are high. You are older than 85 years of age. You are at high risk for heart disease. What should I know about cancer screening? Many types of cancers can be detected early and may often be prevented. Depending on your health history and family history, you may need to have cancer screening at various ages. This may include screening for: Colorectal cancer. Prostate cancer. Skin cancer. Lung  cancer. What should I know about heart disease, diabetes, and high blood pressure? Blood pressure and heart disease High blood pressure causes heart disease and increases the risk of stroke. This is more likely to develop in people who have high blood pressure readings or are overweight. Talk with your health care provider about your target blood pressure readings. Have your blood pressure checked: Every 3-5 years if you are 18-39 years of age. Every year if you are 40 years old or older. If you are between the ages of 65 and 75 and are a current or former smoker, ask your health care provider if you should have a one-time screening for abdominal aortic aneurysm (AAA). Diabetes Have regular diabetes screenings. This checks your fasting blood sugar level. Have the screening done: Once every three years after age 45 if you are at a normal weight and have a low risk for diabetes. More often and at a younger age if you are overweight or have a high risk for diabetes. What should I know about preventing infection? Hepatitis B If you have a higher risk for hepatitis B, you should be screened for this virus. Talk with your health care provider to find out if you are at risk for hepatitis B infection. Hepatitis C Blood testing is recommended for: Everyone born from 1945 through 1965. Anyone with known risk factors for hepatitis C. Sexually transmitted infections (STIs) You should be screened each year for STIs, including gonorrhea and chlamydia, if: You are sexually active and are younger than 85 years of age. You are older than 85 years of age and your   health care provider tells you that you are at risk for this type of infection. Your sexual activity has changed since you were last screened, and you are at increased risk for chlamydia or gonorrhea. Ask your health care provider if you are at risk. Ask your health care provider about whether you are at high risk for HIV. Your health care provider  may recommend a prescription medicine to help prevent HIV infection. If you choose to take medicine to prevent HIV, you should first get tested for HIV. You should then be tested every 3 months for as long as you are taking the medicine. Follow these instructions at home: Alcohol use Do not drink alcohol if your health care provider tells you not to drink. If you drink alcohol: Limit how much you have to 0-2 drinks a day. Know how much alcohol is in your drink. In the [place] one drink equals one 12 oz bottle of beer (355 mL), one 5 oz glass of wine (148 mL), or one 1 oz glass of hard liquor (44 mL). Lifestyle Do not use any products that contain nicotine or tobacco. These products include cigarettes, chewing tobacco, and vaping devices, such as e-cigarettes. If you need help quitting, ask your health care provider. Do not use street drugs. Do not share needles. Ask your health care provider for help if you need support or information about quitting drugs. General instructions Schedule regular health, dental, and eye exams. Stay current with your vaccines. Tell your health care provider if: You often feel depressed. You have ever been abused or do not feel safe at home. Summary Adopting a healthy lifestyle and getting preventive care are important in promoting health and wellness. Follow your health care provider's instructions about healthy diet, exercising, and getting tested or screened for diseases. Follow your health care provider's instructions on monitoring your cholesterol and blood pressure. This information is not intended to replace advice given to you by your health care provider. Make sure you discuss any questions you have with your health care provider. Document Revised: 08/26/2020 Document Reviewed: 08/26/2020 Elsevier Patient Education  2023 Elsevier Inc.  

## 2022-02-13 NOTE — Progress Notes (Unsigned)
Subjective:   [name] is a 87 y.o. male who presents for Medicare Annual/Subsequent preventive examination.  Review of Systems    ***       Objective:    There were no vitals filed for this visit. There is no height or weight on file to calculate BMI.     01/21/2021   11:05 AM 06/03/2020   11:01 AM 05/23/2020   11:11 AM 08/07/2018   10:02 PM 03/28/2018    9:20 AM 12/07/2017    2:37 PM 09/08/2017    9:23 PM  Advanced Directives  Does Patient Have a Medical Advance Directive? No No No No No Yes No  Would patient like information on creating a medical advance directive?       No - Patient declined    Current Medications (verified) Outpatient Encounter Medications as of 02/16/2022  Medication Sig   aspirin EC 81 MG tablet Take 1 tablet (81 mg total) by mouth in the morning. (blood thinner)   atorvastatin (LIPITOR) 40 MG tablet TAKE 1 TABLET AT BEDTIME (CHOLESTEROL)   clopidogrel (PLAVIX) 75 MG tablet Take 1 tablet (75 mg total) by mouth in the morning. (blood thinner)   finasteride (PROSCAR) 5 MG tablet TAKE 1 TABLET EVERY DAY   lisinopril (ZESTRIL) 20 MG tablet TAKE 1 TABLET EVERY MORNING (BLOOD PRESSURE)   Multiple Vitamins-Minerals (MULTIVITAMIN ADULTS 50+ PO) Take by mouth.   Omega-3 1000 MG CAPS Take 1 capsule by mouth in the morning.   terazosin (HYTRIN) 10 MG capsule Take 10 mg by mouth at bedtime.   No facility-administered encounter medications on file as of 02/16/2022.    Allergies (verified) Patient has no allergy information on record.   History: Past Medical History:  Diagnosis Date   Bowel obstruction (HCC)    BPH (benign prostatic hypertrophy)    Gastric outlet obstruction    High cholesterol    Hyperchloremia    Hypertension    Pancreatitis    Past Surgical History:  Procedure Laterality Date   ESOPHAGOGASTRODUODENOSCOPY N/A 12/03/2014   Procedure: ESOPHAGOGASTRODUODENOSCOPY (EGD);  Surgeon: Hulen Luster, MD;  Location: [facility] ENDOSCOPY;  Service:  Endoscopy;  Laterality: N/A;   Family History  Problem Relation Age of Onset   Heart disease Mother    Heart disease Father    Heart disease Brother    Social History   Socioeconomic History   Marital status: Married    Spouse name: Not on file   Number of children: Not on file   Years of education: Not on file   Highest education level: Not on file  Occupational History   Occupation: retired  Tobacco Use   Smoking status: Former    Types: Cigarettes    Quit date: 1985    Years since quitting: 38.8    Passive exposure: Past   Smokeless tobacco: Former  Scientific laboratory technician Use: Never used  Substance and Sexual Activity   Alcohol use: No   Drug use: No   Sexual activity: Never  Other Topics Concern   Not on file  Social History Narrative   Not on file   Social Determinants of Health   Financial Resource Strain: [place]  (11/24/2021)   Overall Financial Resource Strain (CARDIA)    Difficulty of Paying Living Expenses: Not hard at all  Food Insecurity: No Food Insecurity (11/24/2021)   Hunger Vital Sign    Worried About Running Out of Food in the Last Year: Never true  Ran Out of Food in the Last Year: Never true  Transportation Needs: No Transportation Needs (11/24/2021)   PRAPARE - Hydrologist (Medical): No    Lack of Transportation (Non-Medical): No  Physical Activity: Sufficiently Active (11/24/2021)   Exercise Vital Sign    Days of Exercise per Week: 6 days    Minutes of Exercise per Session: 60 min  Stress: No Stress Concern Present (11/24/2021)   [place]    Feeling of Stress : Not at all  Social Connections: [place] (11/24/2021)   Social Connection and Isolation Panel [NHANES]    Frequency of Communication with Friends and Family: More than three times a week    Frequency of Social Gatherings with Friends and Family: More than three times a week     Attends Religious Services: More than 4 times per year    Active Member of [employer] or Organizations: Yes    Attends Music therapist: More than 4 times per year    Marital Status: Married    Tobacco Counseling Counseling given: Not Answered   Clinical Intake:                 Diabetic?***         Activities of Daily Living    02/09/2022    4:29 PM  In your present state of health, do you have any difficulty performing the following activities:  Hearing? 0  Vision? 0  Difficulty concentrating or making decisions? 1  Comment short term memory loss  Walking or climbing stairs? 0  Dressing or bathing? 0  Doing errands, shopping? 0  Preparing Food and eating ? N  Using the Toilet? N  In the past six months, have you accidently leaked urine? N  Do you have problems with loss of bowel control? N  Managing your Medications? N  Managing your Finances? N  Housekeeping or managing your Housekeeping? N    Patient Care Team: [name], DO as PCP - General (Family Medicine) [name], RN as Case Manager (General Practice) Curley Spice, Virl Diamond, RPH-CPP as Pharmacist Rebekah Chesterfield, [name] as Social Worker (Licensed Clinical Social Worker)  Indicate any recent [employer] you may have received from other than [facility] providers in the past year (date may be approximate).     Assessment:   This is a routine wellness examination for [name]  Hearing/Vision screen No results found.  Dietary issues and exercise activities discussed:     Goals Addressed   None    Depression Screen    11/24/2021    3:25 PM 11/11/2021    9:56 AM 01/21/2021   11:06 AM 09/30/2020    9:42 AM 06/26/2019   11:27 AM 10/18/2018   10:58 AM 03/02/2018    1:46 PM  PHQ 2/9 Scores  PHQ - 2 Score 0 0 0 0 0 0 0  PHQ- 9 Score 0 0         Fall Risk    02/09/2022    4:28 PM 11/11/2021    9:56 AM 01/21/2021   11:06 AM 09/30/2020    9:03 AM 06/25/2020    8:57 AM   Fall Risk   Falls in the past year? 0 0 0 0 1  Number falls in past yr: 0 0  0 0  Injury with Fall? 0 0  0 0  Risk for fall due to :  No Fall  Risks No Fall Risks  Impaired balance/gait  Follow up Falls evaluation completed;Education provided Falls evaluation completed Falls evaluation completed;Education provided;Falls prevention discussed Falls evaluation completed Falls evaluation completed    FALL RISK PREVENTION PERTAINING TO THE HOME:  Any stairs in or around the home? {YES/NO:21197} If so, are there any without handrails? {YES/NO:21197} Home free of loose throw rugs in walkways, pet beds, electrical cords, etc? {YES/NO:21197} Adequate lighting in your home to reduce risk of falls? {YES/NO:21197}  ASSISTIVE DEVICES UTILIZED TO PREVENT FALLS:  Life alert? {YES/NO:21197} Use of a cane, walker or w/c? {YES/NO:21197} Grab bars in the bathroom? {YES/NO:21197} Shower chair or bench in shower? {YES/NO:21197} Elevated toilet seat or a handicapped toilet? {YES/NO:21197}  TIMED UP AND GO:  Was the test performed? {YES/NO:21197}.  Length of time to ambulate 10 feet: *** sec.   {Appearance of IZTI:4580998}  Cognitive Function:        01/21/2021   11:10 AM 10/18/2018   11:27 AM 10/12/2017    2:45 PM 08/31/2017    8:49 AM  6CIT Screen  What Year? 0 points 0 points 0 points 0 points  What month? 0 points 0 points 0 points 0 points  What time? 0 points 0 points 0 points 0 points  Count back from 20 0 points 0 points 0 points 0 points  Months in reverse 2 points 0 points 0 points 0 points  Repeat phrase 4 points 0 points 2 points 2 points  Total Score 6 points 0 points 2 points 2 points    Immunizations Immunization History  Administered Date(s) Administered   Fluad Quad(high Dose 65+) 12/14/2018, 02/17/2021   Influenza, Seasonal, Injecte, Preservative Fre 02/07/2009   Influenza-Unspecified 02/02/2002, 02/04/2004, 01/19/2007, 01/06/2008, 01/18/2010, 12/20/2010, 12/20/2014    Pneumococcal Polysaccharide-23 02/27/2002, 08/31/2017   Td 02/27/2002   Tdap 09/08/2017    {TDAP status:2101805}  {Flu Vaccine status:2101806}  {Pneumococcal vaccine status:2101807}  {Covid-19 vaccine status:2101808}  Qualifies for Shingles Vaccine? {YES/NO:21197}  Zostavax completed {YES/NO:21197}  {Shingrix Completed?:2101804}  Screening Tests Health Maintenance  Topic Date Due   COVID-19 Vaccine (1) Never done   Zoster Vaccines- Shingrix (1 of 2) Never done   INFLUENZA VACCINE  11/18/2021   Medicare Annual Wellness (AWV)  01/21/2022   Pneumonia Vaccine 41+ Years old (2 - PCV) 11/12/2022 (Originally 09/01/2018)   TETANUS/TDAP  09/09/2027   HPV VACCINES  Aged Out    Health Maintenance  Health Maintenance Due  Topic Date Due   COVID-19 Vaccine (1) Never done   Zoster Vaccines- Shingrix (1 of 2) Never done   INFLUENZA VACCINE  11/18/2021   Medicare Annual Wellness (AWV)  01/21/2022    {Colorectal cancer screening:2101809}  Lung Cancer Screening: (Low Dose CT Chest recommended if Age 64-80 years, 30 pack-year currently smoking OR have quit w/in 15years.) {DOES NOT does:27190::"does not"} qualify.   Lung Cancer Screening Referral: ***  Additional Screening:  Hepatitis C Screening: {DOES NOT does:27190::"does not"} qualify; Completed ***  Vision Screening: Recommended annual ophthalmology exams for early detection of glaucoma and other disorders of the eye. Is the patient up to date with their annual eye exam?  {YES/NO:21197} Who is the provider or what is the name of the office in which the patient attends annual eye exams? *** If pt is not established with a provider, would they like to be referred to a provider to establish care? {YES/NO:21197}.   Dental Screening: Recommended annual dental exams for proper oral hygiene  Community Resource Referral / Chronic Care Management: CRR  required this visit?  {YES/NO:21197}  CCM required this visit?   {YES/NO:21197}     Plan:     I have personally reviewed and noted the following in the patient's chart:   Medical and social history Use of alcohol, tobacco or illicit drugs  Current medications and supplements including opioid prescriptions. {Opioid Prescriptions:[phone]} Functional ability and status Nutritional status Physical activity Advanced directives List of other physicians Hospitalizations, surgeries, and ER visits in previous 12 months Vitals Screenings to include cognitive, depression, and falls Referrals and appointments  In addition, I have reviewed and discussed with patient certain preventive protocols, quality metrics, and best practice recommendations. A written personalized care plan for preventive services as well as general preventive health recommendations were provided to patient.     Wilson Singer, [place]   02/16/2022  Nurse Notes: ***

## 2022-02-16 ENCOUNTER — Ambulatory Visit (INDEPENDENT_AMBULATORY_CARE_PROVIDER_SITE_OTHER): Payer: PPO

## 2022-02-16 DIAGNOSIS — Z0000 Encounter for general adult medical examination without abnormal findings: Secondary | ICD-10-CM | POA: Diagnosis not present

## 2022-02-17 DIAGNOSIS — G459 Transient cerebral ischemic attack, unspecified: Secondary | ICD-10-CM

## 2022-02-17 DIAGNOSIS — I10 Essential (primary) hypertension: Secondary | ICD-10-CM | POA: Diagnosis not present

## 2022-02-23 ENCOUNTER — Ambulatory Visit: Payer: TRICARE For Life (TFL) | Admitting: Family Medicine

## 2022-03-11 NOTE — Progress Notes (Deleted)
05/12/2021 9:43 AM   [name] [birth year] [id number]  Referring provider: [name], DO [address] [place],  [place] [address]  Urological history: 1. Elevated PSA - PSA (04/2021) 7.72 Prostate MRI 08/2020 - No evidence of high-grade or macroscopic prostate carcinoma.  Prostatomegaly with findings of bladder outlet obstruction.  Mild enlargement of a right inguinal node since 2019, now up to 11 mm. Presumably reactive.  Fat containing inguinal hernias, as before.  2. BPH with LU TS -prostate MRI 2022 - prostate volume 59 cc  -cysto (08/2021) - prostate was moderate in size with lateral lobe hypertrophy, no median lobe - Moderate bladder trabeculations, small diverticulum at dome -I PSS *** -PVR *** mL -terazosin 4 mg daily and finasteride 5 mg daily  3. High risk hematuria -former smoker -CTU (08/2021) -no malignancy -cysto (08/2021) -no malignancy - no report of gross heme  HPI: [name] is a 87 y.o. male who presents today for 6 month follow up.   I PSS ***  PVR ***    Score:  1-7 Mild 8-19 Moderate 20-35 Severe     PMH: Past Medical History:  Diagnosis Date   Bowel obstruction (HCC)    BPH (benign prostatic hypertrophy)    Gastric outlet obstruction    High cholesterol    Hyperchloremia    Hypertension    Pancreatitis     Surgical History: Past Surgical History:  Procedure Laterality Date   ESOPHAGOGASTRODUODENOSCOPY N/A 12/03/2014   Procedure: ESOPHAGOGASTRODUODENOSCOPY (EGD);  Surgeon: Hulen Luster, MD;  Location[facility] END[facility];  Service: Endoscopy;  Laterality: N/A;    Home Medications:  Allergies as of 05/12/2021   No Known Allergies      Medication List        Accurate as of May 12, 2021  9:43 AM. If you have any questions, ask your nurse or doctor.          aspirin EC 81 MG tablet Take 1 tablet (81 mg total) by mouth in the morning. (blood thinner)   atorvastatin 40 MG tablet Commonly known as:  LIPITOR Take 1 tablet (40 mg total) by mouth at bedtime. (Cholesterol)   clopidogrel 75 MG tablet Commonly known as: PLAVIX Take 1 tablet (75 mg total) by mouth in the morning. (blood thinner)   finasteride 5 MG tablet Commonly known as: PROSCAR Take 1 tablet (5 mg total) by mouth daily.   ipratropium 0.06 % nasal spray Commonly known as: ATROVENT Place 2 sprays into both nostrils 4 (four) times daily. For up to 5-7 days then stop.   lisinopril 20 MG tablet Commonly known as: ZESTRIL Take 20 mg by mouth in the morning. (Blood pressure)   MULTIVITAMIN ADULTS 50+ PO Take by mouth.   terazosin 5 MG capsule Commonly known as: HYTRIN Take 5 mg by mouth at bedtime.        Allergies: No Known Allergies  Family History: Family History  Problem Relation Age of Onset   Heart disease Mother    Heart disease Father    Heart disease Brother     Social History:  reports that he quit smoking about 38 years ago. His smoking use included cigarettes. He has quit using smokeless tobacco. He reports that he does not drink alcohol and does not use drugs.  For pertinent review of systems please refer to history of present illness  Physical Exam: BP (!) 151/65 (BP Location: Left Arm, Patient Position: Sitting, Cuff Size: Large)   Pulse  86   Ht '5\' 10"'$  (1.778 m)   Wt 233 lb 3.2 oz (105.8 kg)   BMI 33.46 kg/m   Constitutional:  Well nourished. Alert and oriented, No acute distress. HEENT: [place] AT, moist mucus membranes.  Trachea midline Cardiovascular: No clubbing, cyanosis, or edema. Respiratory: Normal respiratory effort, no increased work of breathing. Neurologic: Grossly intact, no focal deficits, moving all 4 extremities. Psychiatric: Normal mood and affect.   Laboratory Data: N/A   Pertinent Imaging N/A    Assessment & Plan:    1.  Elevated PSA -aged out of screening  2. BPH with LUTS -PVR < 300 cc *** -most bothersome symptoms are dribbling  -continue conservative  management, avoiding bladder irritants and timed voiding's -Continue terazosin 5 mg daily and finasteride 5 mg daily-unsure if patient is compliant with these medications  3.  High risk hematuria -former smoker -work up 2023 - no malignancy -no report of gross hematuria     Return for CT Urogram report and cystoscopy with Dr. Erlene Quan or Dr. Diamantina Providence for micro heme in  .     [place], [place] [address] [place], [place] [address] [phone]

## 2022-03-16 ENCOUNTER — Ambulatory Visit: Payer: Medicare HMO | Admitting: Urology

## 2022-03-16 ENCOUNTER — Other Ambulatory Visit: Payer: Self-pay | Admitting: *Deleted

## 2022-03-16 DIAGNOSIS — N401 Enlarged prostate with lower urinary tract symptoms: Secondary | ICD-10-CM

## 2022-03-16 DIAGNOSIS — R319 Hematuria, unspecified: Secondary | ICD-10-CM

## 2022-03-16 DIAGNOSIS — I6930 Unspecified sequelae of cerebral infarction: Secondary | ICD-10-CM

## 2022-03-16 DIAGNOSIS — R972 Elevated prostate specific antigen [PSA]: Secondary | ICD-10-CM

## 2022-03-16 DIAGNOSIS — I10 Essential (primary) hypertension: Secondary | ICD-10-CM

## 2022-03-16 NOTE — Telephone Encounter (Signed)
Summary: pharmacy requesting clarifications for current   [facility] calling fu at [phone]. States pt was getting all meds at [facility] order and needs clarification. They are wanting to make sure all meds current, he is requested med that is not on current list, want a fu call [place]), [place] Phone: [phone] Fax: [phone]          Called pharmacy to verify request or information needed. Pharmacy staff reports patient came in to pharmacy to request refills of medications from [facility] instead of [facility]. Pharmacy staff reports refill request for plavix, lisinopril, finasteride. Will send refill request.

## 2022-03-17 ENCOUNTER — Telehealth: Payer: TRICARE For Life (TFL)

## 2022-03-17 ENCOUNTER — Ambulatory Visit (INDEPENDENT_AMBULATORY_CARE_PROVIDER_SITE_OTHER): Payer: PPO

## 2022-03-17 DIAGNOSIS — I6930 Unspecified sequelae of cerebral infarction: Secondary | ICD-10-CM

## 2022-03-17 DIAGNOSIS — I10 Essential (primary) hypertension: Secondary | ICD-10-CM

## 2022-03-17 DIAGNOSIS — R413 Other amnesia: Secondary | ICD-10-CM

## 2022-03-17 MED ORDER — CLOPIDOGREL BISULFATE 75 MG PO TABS: 75.0000 mg | ORAL_TABLET | Freq: Every morning | ORAL | 0 refills | Status: DC

## 2022-03-17 MED ORDER — FINASTERIDE 5 MG PO TABS: 5.0000 mg | ORAL_TABLET | Freq: Every day | ORAL | 0 refills | Status: DC

## 2022-03-17 MED ORDER — LISINOPRIL 20 MG PO TABS: ORAL_TABLET | ORAL | 0 refills | Status: DC

## 2022-03-17 NOTE — Chronic Care Management (AMB) (Signed)
Chronic Care Management   CCM RN Visit Note  03/17/2022 Name: [name] MRN: [id number] DOB: [birth year]  Subjective: [name] is a 87 y.o. year old male who is a primary care patient of Jonathan Hauser, DO. The patient was referred to the Chronic Care Management team for assistance with care management needs subsequent to provider initiation of CCM services and plan of care.    Today's Visit:  Engaged with patient by telephone for follow up visit.        Goals Addressed               This Visit's Progress     CCM Expected Outcome:  Monitor, Self-Manage and Reduce Symptoms of [place] Ischemic/TIA        Current Barriers:  Knowledge Deficits related to memory loss and following the plan of care related to past CVA/TIA Care Coordination needs related to ongoing support and education needs  in a patient with history of CVA with memory loss. Chronic Disease Management support and education needs related to effective management of post CVA with memory loss  Lacks caregiver support.  Non-adherence to scheduled provider appointments Non-adherence to prescribed medication regimen Cognitive Deficits  Planned Interventions: Evaluation of current treatment plan related to effective management of past history of CVA and TIA with memory loss, especially short term memory loss and patient's adherence to plan as established by provider. The patient denies any acute changes. The patient can recall lots of information but short term memory is not good. He forgets appointments often. The patient states that he is taking his medications as directed. He was concerned that he didn't have his refills. Assisted patient in getting information that the refill request had been called in to his pharmacy. The patient instructed that his medications have been filled and are ready for pick up Advised patient to call the office for changes in mood, anxiety, depression, new questions and concerns  related to past history of stroke with memory loss Provided education to patient re: calling for changes in his condition or new concerns related to his health and well being Reviewed medications with patient and discussed compliance. The patient is compliant with his medications.Patient verbalized he was almost out of his medications. The patient was happy to find out that his medications were ready for pick up Provided patient with doing crossword puzzles, memory recall activities and creating routines  educational materials related to history of CVA with memory loss and effective management of deficits related to memory loss. Reviewed scheduled/upcoming provider appointments including 05-11-2021 at 0820 am Discussed plans with patient for ongoing care management follow up and provided patient with direct contact information for care management team Advised patient to discuss changes in his memory  with provider  Symptom Management: Take medications as prescribed   Attend all scheduled provider appointments Perform all self care activities independently  Call provider office for new concerns or questions  call the Suicide and Crisis Lifeline: 988 call the [place] National Suicide Prevention Lifeline: (575)356-5916 or TTY: 917-164-5640 TTY (904)682-8024) to talk to a trained counselor call 1-800-273-TALK (toll free, 24 hour hotline) if experiencing a Mental Health or [place]   Follow Up Plan: Telephone follow up appointment with care management team member scheduled for: 05-05-2022 at 345 pm         CCM Expected Outcome:  Monitor, Self-Manage, and Reduce Symptoms of Hypertension        Current Barriers:  Knowledge Deficits related to  effective management of HTN Care Coordination needs related to ongoing support and education needs  in a patient with HTN Chronic Disease Management support and education needs related to effective management of HTN Lacks caregiver support.   Non-adherence to scheduled provider appointments Non-adherence to prescribed medication regimen Cognitive Deficits  BP Readings from Last 3 Encounters:  11/11/21 120/62  09/16/21 (!) 146/67  08/11/21 132/70     Planned Interventions: Evaluation of current treatment plan related to hypertension self management and patient's adherence to plan as established by provider. The patient denies any new issues related to blood pressures. Is not taking his blood pressures at home;   Provided education to patient re: stroke prevention, s/s of heart attack and stroke; Reviewed prescribed diet heart healthy diet. Education. The patient states he ate "too much" over Thanksgiving holiday but he is doing well. Denies any issues with swelling. Reviewed medications with patient and discussed importance of compliance. The patient was concerned as he was "running out of medications". The patient has been to [facility] x 2 to pick up medications and they told him they were not there. RNCM made a call with the patient on the phone to check the status of 3 of his medications. Lisinopril, Proscar, and clopidogrel. Spoke to pharmacy staff and the medications are there for the patient to pick up now. Review of the delay likely because of the holidays. The patient verbalized understanding and will go and pick up medications ;  Discussed plans with patient for ongoing care management follow up and provided patient with direct contact information for care management team; Advised patient, providing education and rationale, to monitor blood pressure daily and record, calling PCP for findings outside established parameters;  Reviewed scheduled/upcoming provider appointments including: 05-11-2021 at [place] am Advised patient to discuss changes in blood pressures or heart health with provider; Provided education on prescribed diet heart healthy. Review of monitoring sodium intake, especially with others preparing his food this  holiday season. The patient knows not to add salt to his dishes.;  Discussed complications of poorly controlled blood pressure such as heart disease, stroke, circulatory complications, vision complications, kidney impairment, sexual dysfunction;  Screening for signs and symptoms of depression related to chronic disease state;  Assessed social determinant of health barriers;   Symptom Management: Take medications as prescribed   Attend all scheduled provider appointments Call pharmacy for medication refills 3-7 days in advance of running out of medications Call provider office for new concerns or questions  Work with the social worker to address care coordination needs and will continue to work with the clinical team to address health care and disease management related needs call the Suicide and Crisis Lifeline: 988 call the [place] National Suicide Prevention Lifeline: 831-166-9759 or TTY: 8638502216 TTY (248)537-5331) to talk to a trained counselor call 1-800-273-TALK (toll free, 24 hour hotline) if experiencing a Mental Health or [place] about high blood pressure call doctor for signs and symptoms of high blood pressure develop an action plan for high blood pressure keep all doctor appointments take medications for blood pressure exactly as prescribed report new symptoms to your doctor eat more whole grains, fruits and vegetables, lean meats and healthy fats  Follow Up Plan: Telephone follow up appointment with care management team member scheduled for:05-05-2022 at 345 pm        COMPLETED: Obtain Coping Skills to assist with Chronic Health Conditions (pt-stated)        Timeframe:  Long-Range Goal Priority:  Medium Start Date:   07/09/20                   Expected End Date: 02/17/21                   Follow Up Date- 02/11/21  Patient Goals/Self-Care Activities: Over the next 90 days Attends all scheduled provider appointments Calls provider office for new  concerns or questions Continue practicing gratitude thinking to promote positive mood      COMPLETED: RNCM: Effective Management of Memory Loss        Care Coordination Interventions:See new goal and plan of care in CCM Evaluation of current treatment plan related to memory loss and patient's adherence to plan as established by provider Advised patient to call the office for changes in his health and well being and to reach out for help with community resources. Also reached out to [name] the patients friend who assist with helping the patient and the he says for the most part the patient is doing well at this time.  Provided education to patient re: keeping a routine schedule, making sure to take h is medications as directed and continue to work with the [facility] and team to effectively manage his health and well being Reviewed medications with patient and discussed compliance. 11-24-2021: The patient states that he is compliant with his medications even though he does not feel they are helping him. Education on the benefits of taking his medications and not to stop. The patient agrees to continue to take his medications. Has had episodes in the past of not taking his medications in the past. The patient also works with the pharm D on a regular basis. Education and support given.  Provided patient and/or caregiver with resources available in the community information about resources available to the patient if needed Advice worker) Provided patient with ways to enhance memory recall educational materials related to memory loss Reviewed scheduled/upcoming provider appointments including sees pcp and specialist on a regular basis Discussed plans with patient for ongoing care management follow up and provided patient with direct contact information for care management team Advised patient to discuss changes in his memory and other concerns about his chronic conditions with provider Screening for signs  and symptoms of depression related to chronic disease state  Assessed social determinant of health barriers AWV is scheduled with the nurse for 01-27-2022           Plan:Telephone follow up appointment with care management team member scheduled for:  05-05-2021 at 345 pm  [place], MSN, CCM RN Care Manager  Chronic Care Management Direct Number: [phone]

## 2022-03-17 NOTE — Telephone Encounter (Signed)
Change of pharmacy Requested Prescriptions  Pending Prescriptions Disp Refills   lisinopril (ZESTRIL) 20 MG tablet 90 tablet 0    Sig: TAKE 1 TABLET EVERY MORNING (BLOOD PRESSURE)     Cardiovascular:  ACE Inhibitors Failed - 03/16/2022 12:00 PM      Failed - Cr in normal range and within 180 days    Creat  Date Value Ref Range Status  08/11/2021 1.25 (H) 0.70 - 1.22 mg/dL Final         Failed - K in normal range and within 180 days    Potassium  Date Value Ref Range Status  08/11/2021 4.5 3.5 - 5.3 mmol/L Final  07/18/2013 4.0 3.5 - 5.1 mmol/L Final         Passed - Patient is not pregnant      Passed - Last BP in normal range    BP Readings from Last 1 Encounters:  11/11/21 120/62         Passed - Valid encounter within last 6 months    Recent Outpatient Visits           2 months ago Benign hypertension with CKD (chronic kidney disease) stage III ([place])   [facility], Grayland Ormond A, RPH-CPP   4 months ago Senile purpura ([place])   [facility], Devonne Doughty, DO   7 months ago Benign hypertension with CKD (chronic kidney disease) stage III [facility])   [facility] [name], DO   1 year ago Benign hypertension with CKD (chronic kidney disease) stage III [facility])   [facility] [name], DO   1 year ago Benign hypertension with CKD (chronic kidney disease) stage III [facility])   [place], DO       Future Appointments             In 1 month Karamalegos, Devonne Doughty, DO [facility], [facility]             finasteride (PROSCAR) 5 MG tablet 90 tablet 0    Sig: Take 1 tablet (5 mg total) by mouth daily.     Urology: 5-alpha Reductase Inhibitors Failed - 03/16/2022 12:00 PM      Failed - PSA in normal range and within 360 days    PSA  Date Value Ref Range Status  06/13/2020 24.34 (H) < OR = 4.0 ng/mL Final    Comment:     The total PSA value from this assay system is  standardized against the WHO standard. The test  result will be approximately 20% lower when compared  to the equimolar-standardized total PSA (Beckman  Coulter). Comparison of serial PSA results should be  interpreted with this fact in mind. . This test was performed using the Siemens  chemiluminescent method. Values obtained from  different assay methods cannot be used interchangeably. PSA levels, regardless of value, should not be interpreted as absolute evidence of the presence or absence of disease.    Prostate Specific Ag, Serum  Date Value Ref Range Status  11/08/2018 17.4 (H) 0.0 - 4.0 ng/mL Final    Comment:    Roche ECLIA methodology. According to the American Urological Association, Serum PSA should decrease and remain at undetectable levels after radical prostatectomy. The AUA defines biochemical recurrence as an initial PSA value 0.2 ng/mL or greater followed by a subsequent confirmatory PSA value 0.2 ng/mL or greater. Values obtained with different assay methods  or kits cannot be used interchangeably. Results cannot be interpreted as absolute evidence of the presence or absence of malignant disease.          Passed - Valid encounter within last 12 months    Recent Outpatient Visits           2 months ago Benign hypertension with CKD (chronic kidney disease) stage III ([place])   [facility], Grayland Ormond A, RPH-CPP   4 months ago Senile purpura ([place])   [facility] [name], DO   7 months ago Benign hypertension with CKD (chronic kidney disease) stage III ([place])   [facility] [name], DO   1 year ago Benign hypertension with CKD (chronic kidney disease) stage III ([place])   [facility] [name], DO   1 year ago Benign hypertension with CKD (chronic kidney disease) stage III ([place])   [facility] [name], DO       Future Appointments             In 1 month [place], Devonne Doughty, DO [facility], [facility]             clopidogrel (PLAVIX) 75 MG tablet 90 tablet 0    Sig: Take 1 tablet (75 mg total) by mouth in the morning. (blood thinner)     Hematology: Antiplatelets - clopidogrel Failed - 03/16/2022 12:00 PM      Failed - HCT in normal range and within 180 days    HCT  Date Value Ref Range Status  08/11/2021 34.2 (L) 38.5 - 50.0 % Final   Hematocrit  Date Value Ref Range Status  11/18/2018 39.8 37.5 - 51.0 % Final         Failed - HGB in normal range and within 180 days    Hemoglobin  Date Value Ref Range Status  08/11/2021 11.5 (L) 13.2 - 17.1 g/dL Final  11/18/2018 13.4 13.0 - 17.7 g/dL Final         Failed - PLT in normal range and within 180 days    Platelets  Date Value Ref Range Status  08/11/2021 176 140 - 400 Thousand/uL Final  11/18/2018 159 150 - 450 x10E3/uL Final         Failed - Cr in normal range and within 360 days    Creat  Date Value Ref Range Status  08/11/2021 1.25 (H) 0.70 - 1.22 mg/dL Final         Passed - Valid encounter within last 6 months    Recent Outpatient Visits           2 months ago Benign hypertension with CKD (chronic kidney disease) stage III ([place])   [facility] Delles, Grayland Ormond A, RPH-CPP   4 months ago Senile purpura ([place])   [facility] [name], DO   7 months ago Benign hypertension with CKD (chronic kidney disease) stage III [facility])   [facility] [name], DO   1 year ago Benign hypertension with CKD (chronic kidney disease) stage III [facility])   [facility] [name], DO   1 year ago Benign hypertension with CKD (chronic kidney disease) stage III [facility])   [facility] [name], DO       Future Appointments             In 1  month  [place], Devonne Doughty, DO [facility]

## 2022-03-17 NOTE — Patient Instructions (Signed)
Please call the care guide team at [phone] if you need to cancel or reschedule your appointment.   If you are experiencing a Mental Health or [place] or need someone to talk to, please call the Suicide and Crisis Lifeline: 988 call the [place] National Suicide Prevention Lifeline: (805)572-2226 or TTY: 769-158-3019 TTY (832)008-3212) to talk to a trained counselor call 1-800-273-TALK (toll free, 24 hour hotline)   Following is a copy of your full provider care plan:   Goals Addressed               This Visit's Progress     CCM Expected Outcome:  Monitor, Self-Manage and Reduce Symptoms of [place] Ischemic/TIA        Current Barriers:  Knowledge Deficits related to memory loss and following the plan of care related to past CVA/TIA Care Coordination needs related to ongoing support and education needs  in a patient with history of CVA with memory loss. Chronic Disease Management support and education needs related to effective management of post CVA with memory loss  Lacks caregiver support.  Non-adherence to scheduled provider appointments Non-adherence to prescribed medication regimen Cognitive Deficits  Planned Interventions: Evaluation of current treatment plan related to effective management of past history of CVA and TIA with memory loss, especially short term memory loss and patient's adherence to plan as established by provider. The patient denies any acute changes. The patient can recall lots of information but short term memory is not good. He forgets appointments often. The patient states that he is taking his medications as directed. He was concerned that he didn't have his refills. Assisted patient in getting information that the refill request had been called in to his pharmacy. The patient instructed that his medications have been filled and are ready for pick up Advised patient to call the office for changes in mood, anxiety, depression, new questions and  concerns related to past history of stroke with memory loss Provided education to patient re: calling for changes in his condition or new concerns related to his health and well being Reviewed medications with patient and discussed compliance. The patient is compliant with his medications.Patient verbalized he was almost out of his medications. The patient was happy to find out that his medications were ready for pick up Provided patient with doing crossword puzzles, memory recall activities and creating routines  educational materials related to history of CVA with memory loss and effective management of deficits related to memory loss. Reviewed scheduled/upcoming provider appointments including 05-11-2021 at 0820 am Discussed plans with patient for ongoing care management follow up and provided patient with direct contact information for care management team Advised patient to discuss changes in his memory  with provider  Symptom Management: Take medications as prescribed   Attend all scheduled provider appointments Perform all self care activities independently  Call provider office for new concerns or questions  call the Suicide and Crisis Lifeline: 988 call the [place] National Suicide Prevention Lifeline: 520 572 3159 or TTY: 772-047-1684 TTY 816-886-0967) to talk to a trained counselor call 1-800-273-TALK (toll free, 24 hour hotline) if experiencing a Mental Health or [place]   Follow Up Plan: Telephone follow up appointment with care management team member scheduled for: 05-05-2022 at 345 pm         CCM Expected Outcome:  Monitor, Self-Manage, and Reduce Symptoms of Hypertension        Current Barriers:  Knowledge Deficits related to effective management of HTN Care Coordination needs  related to ongoing support and education needs  in a patient with HTN Chronic Disease Management support and education needs related to effective management of HTN Lacks caregiver  support.  Non-adherence to scheduled provider appointments Non-adherence to prescribed medication regimen Cognitive Deficits  BP Readings from Last 3 Encounters:  11/11/21 120/62  09/16/21 (!) 146/67  08/11/21 132/70     Planned Interventions: Evaluation of current treatment plan related to hypertension self management and patient's adherence to plan as established by provider. The patient denies any new issues related to blood pressures. Is not taking his blood pressures at home;   Provided education to patient re: stroke prevention, s/s of heart attack and stroke; Reviewed prescribed diet heart healthy diet. Education. The patient states he ate "too much" over Thanksgiving holiday but he is doing well. Denies any issues with swelling. Reviewed medications with patient and discussed importance of compliance. The patient was concerned as he was "running out of medications". The patient has been to [facility] x 2 to pick up medications and they told him they were not there. RNCM made a call with the patient on the phone to check the status of 3 of his medications. Lisinopril, Proscar, and clopidogrel. Spoke to pharmacy staff and the medications are there for the patient to pick up now. Review of the delay likely because of the holidays. The patient verbalized understanding and will go and pick up medications ;  Discussed plans with patient for ongoing care management follow up and provided patient with direct contact information for care management team; Advised patient, providing education and rationale, to monitor blood pressure daily and record, calling PCP for findings outside established parameters;  Reviewed scheduled/upcoming provider appointments including: 05-11-2021 at [place] am Advised patient to discuss changes in blood pressures or heart health with provider; Provided education on prescribed diet heart healthy. Review of monitoring sodium intake, especially with others preparing his food  this holiday season. The patient knows not to add salt to his dishes.;  Discussed complications of poorly controlled blood pressure such as heart disease, stroke, circulatory complications, vision complications, kidney impairment, sexual dysfunction;  Screening for signs and symptoms of depression related to chronic disease state;  Assessed social determinant of health barriers;   Symptom Management: Take medications as prescribed   Attend all scheduled provider appointments Call pharmacy for medication refills 3-7 days in advance of running out of medications Call provider office for new concerns or questions  Work with the social worker to address care coordination needs and will continue to work with the clinical team to address health care and disease management related needs call the Suicide and Crisis Lifeline: 988 call the [place] National Suicide Prevention Lifeline: 303-306-7384 or TTY: (239)816-8280 TTY (986) 494-6061) to talk to a trained counselor call 1-800-273-TALK (toll free, 24 hour hotline) if experiencing a Mental Health or [place] about high blood pressure call doctor for signs and symptoms of high blood pressure develop an action plan for high blood pressure keep all doctor appointments take medications for blood pressure exactly as prescribed report new symptoms to your doctor eat more whole grains, fruits and vegetables, lean meats and healthy fats  Follow Up Plan: Telephone follow up appointment with care management team member scheduled for:05-05-2022 at 345 pm        COMPLETED: Obtain Coping Skills to assist with Chronic Health Conditions (pt-stated)        Timeframe:  Long-Range Goal Priority:  Medium Start Date:   07/09/20  Expected End Date: 02/17/21                   Follow Up Date- 02/11/21  Patient Goals/Self-Care Activities: Over the next 90 days Attends all scheduled provider appointments Calls provider office for  new concerns or questions Continue practicing gratitude thinking to promote positive mood      COMPLETED: RNCM: Effective Management of Memory Loss        Care Coordination Interventions:See new goal and plan of care in CCM Evaluation of current treatment plan related to memory loss and patient's adherence to plan as established by provider Advised patient to call the office for changes in his health and well being and to reach out for help with community resources. Also reached out to [name] the patients friend who assist with helping the patient and the he says for the most part the patient is doing well at this time.  Provided education to patient re: keeping a routine schedule, making sure to take h is medications as directed and continue to work with the [facility] and team to effectively manage his health and well being Reviewed medications with patient and discussed compliance. 11-24-2021: The patient states that he is compliant with his medications even though he does not feel they are helping him. Education on the benefits of taking his medications and not to stop. The patient agrees to continue to take his medications. Has had episodes in the past of not taking his medications in the past. The patient also works with the pharm D on a regular basis. Education and support given.  Provided patient and/or caregiver with resources available in the community information about resources available to the patient if needed Advice worker) Provided patient with ways to enhance memory recall educational materials related to memory loss Reviewed scheduled/upcoming provider appointments including sees pcp and specialist on a regular basis Discussed plans with patient for ongoing care management follow up and provided patient with direct contact information for care management team Advised patient to discuss changes in his memory and other concerns about his chronic conditions with provider Screening for  signs and symptoms of depression related to chronic disease state  Assessed social determinant of health barriers AWV is scheduled with the nurse for 01-27-2022           The patient verbalized understanding of instructions, educational materials, and care plan provided today and DECLINED offer to receive copy of patient instructions, educational materials, and care plan.   Telephone follow up appointment with care management team member scheduled for: 05-05-2022 at 345 pm

## 2022-03-19 DIAGNOSIS — I10 Essential (primary) hypertension: Secondary | ICD-10-CM | POA: Diagnosis not present

## 2022-03-19 DIAGNOSIS — Z87891 Personal history of nicotine dependence: Secondary | ICD-10-CM

## 2022-03-19 DIAGNOSIS — R413 Other amnesia: Secondary | ICD-10-CM

## 2022-03-19 DIAGNOSIS — G459 Transient cerebral ischemic attack, unspecified: Secondary | ICD-10-CM | POA: Diagnosis not present

## 2022-05-05 ENCOUNTER — Telehealth: Payer: PPO

## 2022-05-05 ENCOUNTER — Ambulatory Visit (INDEPENDENT_AMBULATORY_CARE_PROVIDER_SITE_OTHER): Payer: PPO

## 2022-05-05 DIAGNOSIS — R413 Other amnesia: Secondary | ICD-10-CM

## 2022-05-05 DIAGNOSIS — I69319 Unspecified symptoms and signs involving cognitive functions following cerebral infarction: Secondary | ICD-10-CM

## 2022-05-05 DIAGNOSIS — I10 Essential (primary) hypertension: Secondary | ICD-10-CM

## 2022-05-05 DIAGNOSIS — I6930 Unspecified sequelae of cerebral infarction: Secondary | ICD-10-CM

## 2022-05-05 NOTE — Chronic Care Management (AMB) (Signed)
Chronic Care Management   CCM RN Visit Note  05/05/2022 Name: [name] MRN: [id number] DOB: [birth year]  Subjective: [name] is a 88 y.o. year old male who is a primary care patient of [name], DO. The patient was referred to the Chronic Care Management team for assistance with care management needs subsequent to provider initiation of CCM services and plan of care.    Today's Visit:  Engaged with patient by telephone for follow up visit.        Goals Addressed             This Visit's Progress    CCM Expected Outcome:  Monitor, Self-Manage and Reduce Symptoms of [place] Ischemic/TIA       Current Barriers:  Knowledge Deficits related to memory loss and following the plan of care related to past CVA/TIA Care Coordination needs related to ongoing support and education needs  in a patient with history of CVA with memory loss. Chronic Disease Management support and education needs related to effective management of post CVA with memory loss  Lacks caregiver support.  Non-adherence to scheduled provider appointments Non-adherence to prescribed medication regimen Cognitive Deficits  Planned Interventions: Evaluation of current treatment plan related to effective management of past history of CVA and TIA with memory loss, especially short term memory loss and patient's adherence to plan as established by provider. The patient denies any acute changes. The patient can recall lots of information but short term memory is not good. He forgets appointments often. The patient states that he is taking his medications as directed. The patient states he has not had any acute changes in his memory. He is concerned about the decline he sees in his wife and her memory changes. He has taken her keys and license and will not let her drive. Review of safety for the patient and the patients wife. He does have a friend that does help and checks in on him and his wife. He states that he  feels overall they are doing okay. He still says he is having issues with his urinary flow. Review of making sure he discusses new concerns with the provider and taking his medications.  Advised patient to call the office for changes in mood, anxiety, depression, new questions and concerns related to past history of stroke with memory loss Provided education to patient re: calling for changes in his condition or new concerns related to his health and well being Reviewed medications with patient and discussed compliance. The patient is compliant with his medications. Praised the patient for taking his medications as directed. The patient has admitted to not taking medications as directed in the past. Education provided on the benefits of taking medications to help the patient maintain his health and well being.  Provided patient with doing crossword puzzles, memory recall activities and creating routines  educational materials related to history of CVA with memory loss and effective management of deficits related to memory loss. Reviewed scheduled/upcoming provider appointments including 05-11-2021 at [place] am. Reminder provided today Discussed plans with patient for ongoing care management follow up and provided patient with direct contact information for care management team Advised patient to discuss changes in his memory  with provider  Symptom Management: Take medications as prescribed   Attend all scheduled provider appointments Perform all self care activities independently  Call provider office for new concerns or questions  call the Suicide and Crisis Lifeline: 988 call the [place] National Suicide Prevention Lifeline:  (510)556-9166 or TTY: (984)232-6607 TTY (916)810-4431) to talk to a trained counselor call 1-800-273-TALK (toll free, 24 hour hotline) if experiencing a Mental Health or [place]   Follow Up Plan: Telephone follow up appointment with care management team member  scheduled for: 06-30-2022 at 345 pm        CCM Expected Outcome:  Monitor, Self-Manage, and Reduce Symptoms of Hypertension       Current Barriers:  Knowledge Deficits related to effective management of HTN Care Coordination needs related to ongoing support and education needs  in a patient with HTN Chronic Disease Management support and education needs related to effective management of HTN Lacks caregiver support.  Non-adherence to scheduled provider appointments Non-adherence to prescribed medication regimen Cognitive Deficits  BP Readings from Last 3 Encounters:  11/11/21 120/62  09/16/21 (!) 146/67  08/11/21 132/70     Planned Interventions: Evaluation of current treatment plan related to hypertension self management and patient's adherence to plan as established by provider. The patient denies any new issues related to blood pressures. Is not taking his blood pressures at home. Denies headaches or any other concerns related to HTN or heart health;   Provided education to patient re: stroke prevention, s/s of heart attack and stroke. Reviewed with the patient; Reviewed prescribed diet heart healthy diet. Education. The patient states he is eating well. He says sometimes he eats "too well". Education on monitoring for hidden sodium in his diet.  Reviewed medications with patient and discussed importance of compliance. The patient states compliance with his medications. States he has his medications and is taking his medications consistently. Review of medications and the patient able to tell the RNCM the ones he takes.  Discussed plans with patient for ongoing care management follow up and provided patient with direct contact information for care management team; Advised patient, providing education and rationale, to monitor blood pressure daily and record, calling PCP for findings outside established parameters. The patient does not take his blood pressures at home. Review of the  benefit of blood pressure checks;  Reviewed scheduled/upcoming provider appointments including: 05-11-2021 at [place] am. Reminder given to the patient today. The patient sometimes forgets to go to his appointments.  Advised patient to discuss changes in blood pressures or heart health with provider; Provided education on prescribed diet heart healthy. Review of monitoring sodium intake, the patient knows not to add salt to his dishes.;  Discussed complications of poorly controlled blood pressure such as heart disease, stroke, circulatory complications, vision complications, kidney impairment, sexual dysfunction;  Screening for signs and symptoms of depression related to chronic disease state;  Assessed social determinant of health barriers;   Symptom Management: Take medications as prescribed   Attend all scheduled provider appointments Call pharmacy for medication refills 3-7 days in advance of running out of medications Call provider office for new concerns or questions  Work with the social worker to address care coordination needs and will continue to work with the clinical team to address health care and disease management related needs call the Suicide and Crisis Lifeline: 988 call the [place] National Suicide Prevention Lifeline: (207)505-3333 or TTY: 705-153-3954 TTY 360 773 8855) to talk to a trained counselor call 1-800-273-TALK (toll free, 24 hour hotline) if experiencing a Mental Health or [place] about high blood pressure call doctor for signs and symptoms of high blood pressure develop an action plan for high blood pressure keep all doctor appointments take medications for blood pressure exactly as prescribed  report new symptoms to your doctor eat more whole grains, fruits and vegetables, lean meats and healthy fats  Follow Up Plan: Telephone follow up appointment with care management team member scheduled for: 06-30-2022 at 345 pm           Plan:Telephone follow up appointment with care management team member scheduled for:  06-30-2022 at 57 pm  [place], MSN, CCM RN Care Manager  Chronic Care Management Direct Number: [phone]

## 2022-05-05 NOTE — Patient Instructions (Signed)
Please call the care guide team at [phone] if you need to cancel or reschedule your appointment.   If you are experiencing a Mental Health or [place] or need someone to talk to, please call the Suicide and Crisis Lifeline: 988 call the [place] National Suicide Prevention Lifeline: 971 770 7646 or TTY: (724) 783-4709 TTY 302 282 4880) to talk to a trained counselor call 1-800-273-TALK (toll free, 24 hour hotline)   Following is a copy of the CCM Program Consent:  CCM service includes personalized support from designated clinical staff supervised by the physician, including individualized plan of care and coordination with other care providers 24/7 contact phone numbers for assistance for urgent and routine care needs. Service will only be billed when office clinical staff spend 20 minutes or more in a month to coordinate care. Only one practitioner may furnish and bill the service in a calendar month. The patient may stop CCM services at amy time (effective at the end of the month) by phone call to the office staff. The patient will be responsible for cost sharing (co-pay) or up to 20% of the service fee (after annual deductible is met)  Following is a copy of your full provider care plan:   Goals Addressed             This Visit's Progress    CCM Expected Outcome:  Monitor, Self-Manage and Reduce Symptoms of [place] Ischemic/TIA       Current Barriers:  Knowledge Deficits related to memory loss and following the plan of care related to past CVA/TIA Care Coordination needs related to ongoing support and education needs  in a patient with history of CVA with memory loss. Chronic Disease Management support and education needs related to effective management of post CVA with memory loss  Lacks caregiver support.  Non-adherence to scheduled provider appointments Non-adherence to prescribed medication regimen Cognitive Deficits  Planned Interventions: Evaluation of  current treatment plan related to effective management of past history of CVA and TIA with memory loss, especially short term memory loss and patient's adherence to plan as established by provider. The patient denies any acute changes. The patient can recall lots of information but short term memory is not good. He forgets appointments often. The patient states that he is taking his medications as directed. The patient states he has not had any acute changes in his memory. He is concerned about the decline he sees in his wife and her memory changes. He has taken her keys and license and will not let her drive. Review of safety for the patient and the patients wife. He does have a friend that does help and checks in on him and his wife. He states that he feels overall they are doing okay. He still says he is having issues with his urinary flow. Review of making sure he discusses new concerns with the provider and taking his medications.  Advised patient to call the office for changes in mood, anxiety, depression, new questions and concerns related to past history of stroke with memory loss Provided education to patient re: calling for changes in his condition or new concerns related to his health and well being Reviewed medications with patient and discussed compliance. The patient is compliant with his medications. Praised the patient for taking his medications as directed. The patient has admitted to not taking medications as directed in the past. Education provided on the benefits of taking medications to help the patient maintain his health and well being.  Provided patient  with doing crossword puzzles, memory recall activities and creating routines  educational materials related to history of CVA with memory loss and effective management of deficits related to memory loss. Reviewed scheduled/upcoming provider appointments including 05-11-2021 at [place] am. Reminder provided today Discussed plans with patient  for ongoing care management follow up and provided patient with direct contact information for care management team Advised patient to discuss changes in his memory  with provider  Symptom Management: Take medications as prescribed   Attend all scheduled provider appointments Perform all self care activities independently  Call provider office for new concerns or questions  call the Suicide and Crisis Lifeline: 988 call the [place] National Suicide Prevention Lifeline: 912-380-2358 or TTY: 3868025820 TTY (219)370-1840) to talk to a trained counselor call 1-800-273-TALK (toll free, 24 hour hotline) if experiencing a Mental Health or [place]   Follow Up Plan: Telephone follow up appointment with care management team member scheduled for: 06-30-2022 at 82 pm        CCM Expected Outcome:  Monitor, Self-Manage, and Reduce Symptoms of Hypertension       Current Barriers:  Knowledge Deficits related to effective management of HTN Care Coordination needs related to ongoing support and education needs  in a patient with HTN Chronic Disease Management support and education needs related to effective management of HTN Lacks caregiver support.  Non-adherence to scheduled provider appointments Non-adherence to prescribed medication regimen Cognitive Deficits  BP Readings from Last 3 Encounters:  11/11/21 120/62  09/16/21 (!) 146/67  08/11/21 132/70     Planned Interventions: Evaluation of current treatment plan related to hypertension self management and patient's adherence to plan as established by provider. The patient denies any new issues related to blood pressures. Is not taking his blood pressures at home. Denies headaches or any other concerns related to HTN or heart health;   Provided education to patient re: stroke prevention, s/s of heart attack and stroke. Reviewed with the patient; Reviewed prescribed diet heart healthy diet. Education. The patient states he is  eating well. He says sometimes he eats "too well". Education on monitoring for hidden sodium in his diet.  Reviewed medications with patient and discussed importance of compliance. The patient states compliance with his medications. States he has his medications and is taking his medications consistently. Review of medications and the patient able to tell the RNCM the ones he takes.  Discussed plans with patient for ongoing care management follow up and provided patient with direct contact information for care management team; Advised patient, providing education and rationale, to monitor blood pressure daily and record, calling PCP for findings outside established parameters. The patient does not take his blood pressures at home. Review of the benefit of blood pressure checks;  Reviewed scheduled/upcoming provider appointments including: 05-11-2021 at [place] am. Reminder given to the patient today. The patient sometimes forgets to go to his appointments.  Advised patient to discuss changes in blood pressures or heart health with provider; Provided education on prescribed diet heart healthy. Review of monitoring sodium intake, the patient knows not to add salt to his dishes.;  Discussed complications of poorly controlled blood pressure such as heart disease, stroke, circulatory complications, vision complications, kidney impairment, sexual dysfunction;  Screening for signs and symptoms of depression related to chronic disease state;  Assessed social determinant of health barriers;   Symptom Management: Take medications as prescribed   Attend all scheduled provider appointments Call pharmacy for medication refills 3-7 days in advance of running  out of medications Call provider office for new concerns or questions  Work with the social worker to address care coordination needs and will continue to work with the clinical team to address health care and disease management related needs call the Suicide and  Crisis Lifeline: 988 call the [place] National Suicide Prevention Lifeline: (986)140-9497 or TTY: (416)563-0605 TTY (725)279-6742) to talk to a trained counselor call 1-800-273-TALK (toll free, 24 hour hotline) if experiencing a Mental Health or [place] about high blood pressure call doctor for signs and symptoms of high blood pressure develop an action plan for high blood pressure keep all doctor appointments take medications for blood pressure exactly as prescribed report new symptoms to your doctor eat more whole grains, fruits and vegetables, lean meats and healthy fats  Follow Up Plan: Telephone follow up appointment with care management team member scheduled for: 06-30-2022 at 345 pm          The patient verbalized understanding of instructions, educational materials, and care plan provided today and DECLINED offer to receive copy of patient instructions, educational materials, and care plan.   Telephone follow up appointment with care management team member scheduled for: 06-30-2022 at 345 pm

## 2022-05-11 ENCOUNTER — Encounter: Payer: Self-pay | Admitting: Family Medicine

## 2022-05-11 ENCOUNTER — Ambulatory Visit (INDEPENDENT_AMBULATORY_CARE_PROVIDER_SITE_OTHER): Payer: PPO | Admitting: Family Medicine

## 2022-05-11 VITALS — BP 120/70 | HR 69 | Ht 70.0 in | Wt 235.0 lb

## 2022-05-11 DIAGNOSIS — I6930 Unspecified sequelae of cerebral infarction: Secondary | ICD-10-CM | POA: Diagnosis not present

## 2022-05-11 DIAGNOSIS — N138 Other obstructive and reflux uropathy: Secondary | ICD-10-CM | POA: Diagnosis not present

## 2022-05-11 DIAGNOSIS — N183 Chronic kidney disease, stage 3 unspecified: Secondary | ICD-10-CM

## 2022-05-11 DIAGNOSIS — I129 Hypertensive chronic kidney disease with stage 1 through stage 4 chronic kidney disease, or unspecified chronic kidney disease: Secondary | ICD-10-CM

## 2022-05-11 DIAGNOSIS — E782 Mixed hyperlipidemia: Secondary | ICD-10-CM

## 2022-05-11 DIAGNOSIS — N401 Enlarged prostate with lower urinary tract symptoms: Secondary | ICD-10-CM

## 2022-05-11 DIAGNOSIS — I69319 Unspecified symptoms and signs involving cognitive functions following cerebral infarction: Secondary | ICD-10-CM | POA: Diagnosis not present

## 2022-05-11 MED ORDER — ATORVASTATIN CALCIUM 40 MG PO TABS: 40.0000 mg | ORAL_TABLET | Freq: Every day | ORAL | 3 refills | Status: DC

## 2022-05-11 MED ORDER — FINASTERIDE 5 MG PO TABS: 5.0000 mg | ORAL_TABLET | Freq: Every day | ORAL | 3 refills | Status: DC

## 2022-05-11 MED ORDER — CLOPIDOGREL BISULFATE 75 MG PO TABS: 75.0000 mg | ORAL_TABLET | Freq: Every morning | ORAL | 3 refills | Status: DC

## 2022-05-11 MED ORDER — LISINOPRIL 20 MG PO TABS: 20.0000 mg | ORAL_TABLET | Freq: Every day | ORAL | 3 refills | Status: DC

## 2022-05-11 NOTE — Progress Notes (Signed)
Subjective:    Patient ID: [name], male    DOB: [birth year], 88 y.o.   MRN: [id number]  [name] is a 88 y.o. male presenting on 05/11/2022 for Hypertension   HPI   CHRONIC HTN w/ CKD III Limited med adherence lately off alpha blocker Current Meds - Lisinopril '20mg'$  daily Reports good compliance, took meds today. Tolerating well, w/o complaints. Lifestyle: - Diet: balanced diet - Exercise: walking Denies CP, dyspnea, HA, edema, dizziness / lightheadedness   Hyperlipidemia History of CVA Cognitive Decline following stroke. Previuosly On Atorvastatin '40mg'$ , ASA 81 and Plavix '75mg'$  was on DAPT for stroke secondary prevention. Needs help adhering to medicines.   Elevated PSA / BPH Nocturia Urinary Incontinence Followed by Urology [facility] He had Prostate MRI Has been on Finasteride for med management, had issues with med adherence with memory OFF Terazosin Still has nocturia and urinary frequency. Admits Erectile Dysfunction   Memory Loss Chronic gradual persistent problem, impacting his daily functions especially medication adherence.   Health Maintenance: Did not receive Flu Shot this season.     05/11/2022    8:35 AM 02/16/2022    2:11 PM 11/24/2021    3:25 PM  Depression screen PHQ 2/9  Decreased Interest 3 0 0  Down, Depressed, Hopeless 0 0 0  PHQ - 2 Score 3 0 0  Altered sleeping 0  0  Tired, decreased energy 0  0  Change in appetite 0  0  Feeling bad or failure about yourself  0  0  Trouble concentrating 0  0  Moving slowly or fidgety/restless 0  0  Suicidal thoughts 0  0  PHQ-9 Score 3  0  Difficult doing work/chores Not difficult at all  Not difficult at all    Social History   Tobacco Use   Smoking status: Former    Types: Cigarettes    Quit date: 1985    Years since quitting: 39.0    Passive exposure: Past   Smokeless tobacco: Former  Scientific laboratory technician Use: Never used  Substance Use Topics   Alcohol use: No   Drug use: No    Review  of Systems Per HPI unless specifically indicated above     Objective:    BP 120/70   Pulse 69   Ht '5\' 10"'$  (1.778 m)   Wt 235 lb (106.6 kg)   SpO2 99%   BMI 33.72 kg/m   Wt Readings from Last 3 Encounters:  05/11/22 235 lb (106.6 kg)  11/11/21 232 lb 9.6 oz (105.5 kg)  09/16/21 234 lb (106.1 kg)    Physical Exam Vitals and nursing note reviewed.  Constitutional:      General: He is not in acute distress.    Appearance: Normal appearance. He is well-developed. He is not diaphoretic.     Comments: Well-appearing, comfortable, cooperative  HENT:     Head: Normocephalic and atraumatic.  Eyes:     General:        Right eye: No discharge.        Left eye: No discharge.     Conjunctiva/sclera: Conjunctivae normal.  Cardiovascular:     Rate and Rhythm: Normal rate.  Pulmonary:     Effort: Pulmonary effort is normal.  Skin:    General: Skin is warm and dry.     Findings: No erythema or rash.  Neurological:     Mental Status: He is alert and oriented to person, place, and time.  Psychiatric:  Mood and Affect: Mood normal.        Behavior: Behavior normal.        Thought Content: Thought content normal.     Comments: Well groomed, good eye contact, normal speech and thoughts       Results for orders placed or performed during the hospital encounter of 09/16/21  Urinalysis, Complete w Microscopic  Result Value Ref Range   Color, Urine YELLOW YELLOW   APPearance CLEAR CLEAR   Specific Gravity, Urine 1.020 1.005 - 1.030   pH 7.0 5.0 - 8.0   Glucose, UA NEGATIVE NEGATIVE mg/dL   Hgb urine dipstick NEGATIVE NEGATIVE   Bilirubin Urine NEGATIVE NEGATIVE   Ketones, ur NEGATIVE NEGATIVE mg/dL   Protein, ur NEGATIVE NEGATIVE mg/dL   Nitrite NEGATIVE NEGATIVE   Leukocytes,Ua NEGATIVE NEGATIVE   Squamous Epithelial / HPF NONE SEEN 0 - 5   WBC, UA 0-5 0 - 5 WBC/hpf   RBC / HPF NONE SEEN 0 - 5 RBC/hpf   Bacteria, UA NONE SEEN NONE SEEN      Assessment & Plan:    Problem List Items Addressed This Visit     Benign hypertension with CKD (chronic kidney disease) stage III (HCC)    Well-controlled HTN - Home BP readings controlled  Complication with CKD-III   Plan:  1. Continue current BP regimen 2. Encourage improved lifestyle - low sodium diet, regular exercise 3. Continue monitor BP outside office, bring readings to next visit, if persistently >140/90 or new symptoms notify office sooner      Relevant Medications   atorvastatin (LIPITOR) 40 MG tablet   lisinopril (ZESTRIL) 20 MG tablet   BPH with obstruction/lower urinary tract symptoms    Followed by [facility] On Finasteride, off alpha blocker      Relevant Medications   finasteride (PROSCAR) 5 MG tablet   Cognitive deficit as late effect of cerebrovascular accident (CVA)    Persistent chronic problem without significant decline Followed by CCM team.      History of cerebrovascular accident (CVA) with residual deficit - Primary    History of CVA w/ residual cognitive decline Stable without dramatic change since prior visit      Relevant Medications   clopidogrel (PLAVIX) 75 MG tablet   HLD (hyperlipidemia)   Relevant Medications   atorvastatin (LIPITOR) 40 MG tablet   lisinopril (ZESTRIL) 20 MG tablet     Ordered all 4 medications to [facility] order pharmacy. This should be the correct one. I am not sure how long it will take, if you wait 2 weeks and hear nothing back, please let me know and we can check into it.    Meds ordered this encounter  Medications   atorvastatin (LIPITOR) 40 MG tablet    Sig: Take 1 tablet (40 mg total) by mouth daily.    Dispense:  90 tablet    Refill:  3   clopidogrel (PLAVIX) 75 MG tablet    Sig: Take 1 tablet (75 mg total) by mouth in the morning. (blood thinner)    Dispense:  90 tablet    Refill:  3   finasteride (PROSCAR) 5 MG tablet    Sig: Take 1 tablet (5 mg total) by mouth daily.    Dispense:  90 tablet    Refill:  3    lisinopril (ZESTRIL) 20 MG tablet    Sig: Take 1 tablet (20 mg total) by mouth daily.    Dispense:  90 tablet  Refill:  3     Follow up plan: Return in about 3 months (around 08/10/2022) for 3 month Annual Physical AM apt and fasting lab AFTER.   [name], [facility] 05/11/2022, 8:47 AM

## 2022-05-11 NOTE — Assessment & Plan Note (Signed)
Persistent chronic problem without significant decline Followed by CCM team.

## 2022-05-11 NOTE — Assessment & Plan Note (Signed)
Well-controlled HTN - Home BP readings controlled  Complication with CKD-III   Plan:  1. Continue current BP regimen 2. Encourage improved lifestyle - low sodium diet, regular exercise 3. Continue monitor BP outside office, bring readings to next visit, if persistently >140/90 or new symptoms notify office sooner

## 2022-05-11 NOTE — Patient Instructions (Addendum)
Thank you for coming to the office today.  Ordered all 4 medications to [facility] order pharmacy. This should be the correct one. I am not sure how long it will take, if you wait 2 weeks and hear nothing back, please let me know and we can check into it.  Continue current medications.  DUE for FASTING BLOOD WORK (no food or drink after midnight before the lab appointment, only water or coffee without cream/sugar on the morning of)  SCHEDULE "Lab Only" visit in the morning at the clinic for lab draw in 3 MONTHS   - Make sure Lab Only appointment is at about 1 week before your next appointment, so that results will be available  For Lab Results, once available within 2-3 days of blood draw, you can can log in to MyChart online to view your results and a brief explanation. Also, we can discuss results at next follow-up visit.   Please schedule a Follow-up Appointment to: Return in about 3 months (around 08/10/2022) for 3 month Annual Physical AM apt and fasting lab AFTER.  If you have any other questions or concerns, please feel free to call the office or send a message through [place]. You may also schedule an earlier appointment if necessary.  Additionally, you may be receiving a survey about your experience at our office within a few days to 1 week by e-mail or mail. We value your feedback.  Nobie Putnam, DO [place]

## 2022-05-11 NOTE — Assessment & Plan Note (Signed)
History of CVA w/ residual cognitive decline Stable without dramatic change since prior visit

## 2022-05-11 NOTE — Assessment & Plan Note (Signed)
Followed by [facility] On Finasteride, off alpha blocker

## 2022-05-20 DIAGNOSIS — I10 Essential (primary) hypertension: Secondary | ICD-10-CM | POA: Diagnosis not present

## 2022-05-20 DIAGNOSIS — G459 Transient cerebral ischemic attack, unspecified: Secondary | ICD-10-CM | POA: Diagnosis not present

## 2022-05-27 ENCOUNTER — Other Ambulatory Visit: Payer: Self-pay | Admitting: Family Medicine

## 2022-05-27 DIAGNOSIS — N401 Enlarged prostate with lower urinary tract symptoms: Secondary | ICD-10-CM

## 2022-05-27 DIAGNOSIS — I129 Hypertensive chronic kidney disease with stage 1 through stage 4 chronic kidney disease, or unspecified chronic kidney disease: Secondary | ICD-10-CM

## 2022-05-27 DIAGNOSIS — I6930 Unspecified sequelae of cerebral infarction: Secondary | ICD-10-CM

## 2022-05-28 NOTE — Telephone Encounter (Signed)
Requested medication (s) are due for refill today:   Yes  Requested medication (s) are on the active medication list:   Yes  Future visit scheduled:   No   Seen 2 wks ago   Last ordered: All ordered 05/11/2022 with 3 refills.      Returned because labs are due per protocol.   It looks like these may have been sent to mail order pharmacy but this request is from [place].  Due to protocol criteria not met unable to refill.   Requested Prescriptions  Pending Prescriptions Disp Refills   clopidogrel (PLAVIX) 75 MG tablet [Pharmacy Med Name: Clopidogrel Bisulfate 75 MG Oral Tablet] 90 tablet 0    Sig: TAKE 1 TABLET BY MOUTH IN THE MORNING (BLOOD  THINNER)     Hematology: Antiplatelets - clopidogrel Failed - 05/27/2022 12:39 PM      Failed - HCT in normal range and within 180 days    HCT  Date Value Ref Range Status  08/11/2021 34.2 (L) 38.5 - 50.0 % Final   Hematocrit  Date Value Ref Range Status  11/18/2018 39.8 37.5 - 51.0 % Final         Failed - HGB in normal range and within 180 days    Hemoglobin  Date Value Ref Range Status  08/11/2021 11.5 (L) 13.2 - 17.1 g/dL Final  11/18/2018 13.4 13.0 - 17.7 g/dL Final         Failed - PLT in normal range and within 180 days    Platelets  Date Value Ref Range Status  08/11/2021 176 140 - 400 Thousand/uL Final  11/18/2018 159 150 - 450 x10E3/uL Final         Failed - Cr in normal range and within 360 days    Creat  Date Value Ref Range Status  08/11/2021 1.25 (H) 0.70 - 1.22 mg/dL Final         Passed - Valid encounter within last 6 months    Recent Outpatient Visits           2 weeks ago History of cerebrovascular accident (CVA) with residual deficit   [place], DO   4 months ago Benign hypertension with CKD (chronic kidney disease) stage III [facility])   [facility] Delles, Grayland Ormond A, RPH-CPP   6 months ago Senile purpura [facility])   [place], Devonne Doughty, DO   9 months ago Benign hypertension with CKD (chronic kidney disease) stage III [facility])   [facility] [place], Devonne Doughty, DO   1 year ago Benign hypertension with CKD (chronic kidney disease) stage III [facility])   [place], Devonne Doughty, DO               finasteride (PROSCAR) 5 MG tablet [Pharmacy Med Name: Finasteride 5 MG Oral Tablet] 90 tablet 0    Sig: Take 1 tablet by mouth once daily     Urology: 5-alpha Reductase Inhibitors Failed - 05/27/2022 12:39 PM      Failed - PSA in normal range and within 360 days    PSA  Date Value Ref Range Status  06/13/2020 24.34 (H) < OR = 4.0 ng/mL Final    Comment:    The total PSA value from this assay system is  standardized against the WHO standard. The test  result will be approximately 20% lower when  compared  to the equimolar-standardized total PSA (Beckman  Coulter). Comparison of serial PSA results should be  interpreted with this fact in mind. . This test was performed using the Siemens  chemiluminescent method. Values obtained from  different assay methods cannot be used interchangeably. PSA levels, regardless of value, should not be interpreted as absolute evidence of the presence or absence of disease.    Prostate Specific Ag, Serum  Date Value Ref Range Status  11/08/2018 17.4 (H) 0.0 - 4.0 ng/mL Final    Comment:    Roche ECLIA methodology. According to the American Urological Association, Serum PSA should decrease and remain at undetectable levels after radical prostatectomy. The AUA defines biochemical recurrence as an initial PSA value 0.2 ng/mL or greater followed by a subsequent confirmatory PSA value 0.2 ng/mL or greater. Values obtained with different assay methods or kits cannot be used interchangeably. Results cannot be interpreted as absolute evidence of the presence or absence of  malignant disease.          Passed - Valid encounter within last 12 months    Recent Outpatient Visits           2 weeks ago History of cerebrovascular accident (CVA) with residual deficit   [place], DO   4 months ago Benign hypertension with CKD (chronic kidney disease) stage III [facility])   [facility] Delles, Grayland Ormond A, RPH-CPP   6 months ago Senile purpura [facility])   [facility] [name], DO   9 months ago Benign hypertension with CKD (chronic kidney disease) stage III [facility])   [facility] [name], DO   1 year ago Benign hypertension with CKD (chronic kidney disease) stage III [facility])   [facility] [place], Devonne Doughty, DO               lisinopril (ZESTRIL) 20 MG tablet [Pharmacy Med Name: Lisinopril 20 MG Oral Tablet] 90 tablet 0    Sig: TAKE 1 TABLET BY MOUTH IN THE MORNING FOR BLOOD PRESSURE     Cardiovascular:  ACE Inhibitors Failed - 05/27/2022 12:39 PM      Failed - Cr in normal range and within 180 days    Creat  Date Value Ref Range Status  08/11/2021 1.25 (H) 0.70 - 1.22 mg/dL Final         Failed - K in normal range and within 180 days    Potassium  Date Value Ref Range Status  08/11/2021 4.5 3.5 - 5.3 mmol/L Final  07/18/2013 4.0 3.5 - 5.1 mmol/L Final         Passed - Patient is not pregnant      Passed - Last BP in normal range    BP Readings from Last 1 Encounters:  05/11/22 120/70         Passed - Valid encounter within last 6 months    Recent Outpatient Visits           2 weeks ago History of cerebrovascular accident (CVA) with residual deficit   [place], DO   4 months ago Benign hypertension with CKD (chronic kidney disease) stage III [facility])   [place],  Grayland Ormond A, RPH-CPP   6 months ago Senile purpura [facility])   [facility] [place], Shepp[place] Coil  J, DO   9 months ago Benign hypertension with CKD (chronic kidney disease) stage III [facility])   [facility] [place], Devonne Doughty, DO   1 year ago Benign hypertension with CKD (chronic kidney disease) stage III [facility])   [facility] [place], Devonne Doughty, DO

## 2022-05-28 NOTE — Telephone Encounter (Signed)
Pt called asking about the medication refills on the "little pink pills."  He doesn't know which medication.  He also says he is not getting anything from mail order now.  Which was noted on the message earlier.  CB#  [phone]

## 2022-05-29 ENCOUNTER — Other Ambulatory Visit: Payer: Self-pay | Admitting: *Deleted

## 2022-05-29 DIAGNOSIS — I6930 Unspecified sequelae of cerebral infarction: Secondary | ICD-10-CM

## 2022-05-29 DIAGNOSIS — N401 Enlarged prostate with lower urinary tract symptoms: Secondary | ICD-10-CM

## 2022-05-29 DIAGNOSIS — N183 Chronic kidney disease, stage 3 unspecified: Secondary | ICD-10-CM

## 2022-05-29 DIAGNOSIS — E782 Mixed hyperlipidemia: Secondary | ICD-10-CM

## 2022-05-29 MED ORDER — ATORVASTATIN CALCIUM 40 MG PO TABS: 40.0000 mg | ORAL_TABLET | Freq: Every day | ORAL | 3 refills | Status: DC

## 2022-05-29 NOTE — Telephone Encounter (Signed)
The patient called to follow up on his medication refills. Pt stated [facility] does not have his refills. I reached out to the pharmacy directly and was advised they do not have any medication.  Pt is requesting a callback with an update.   Please advise.

## 2022-05-29 NOTE — Telephone Encounter (Signed)
All meds were ordered on the refill request yesterday 05/28/22  I advised patient to go to the pharmacy.  Nobie Putnam, [facility] 05/29/2022, 4:10 PM

## 2022-05-29 NOTE — Telephone Encounter (Signed)
Patient called in multiple times to request why medication not sent to pharmacy. Patient is out of medications and requesting all medications to be refilled. Has not taken x 1 week per patient. In review of chart medication requests signed 05/28/22. Please advise .

## 2022-06-05 NOTE — Plan of Care (Signed)
Chronic Care Management Provider Comprehensive Care Plan    06/05/2022 Name: [name] MRN: [id number] DOB: [birth year]  Referral to Chronic Care Management (CCM) services was placed by Provider:  Dr. Parks Ranger on Date: 01-15-2022.  Chronic Condition 1: HTN Provider Assessment and Plan Controlled currently on regimen. COMPLETE METABOLIC PANEL WITH GFR      CBC with Differential/Platelet      Expected Outcome/Goals Addressed This Visit (Provider CCM goals/Provider Assessment and plan   CCM (HYPERTENSION) EXPECTED OUTCOME: MONITOR,SELF- MANAGE AND REDUCE SYMPTOMS OF HYPERTENSION   Symptom Management Condition 1: Take all medications as prescribed Attend all scheduled provider appointments Call provider office for new concerns or questions  call the Suicide and Crisis Lifeline: 988 call the [place] National Suicide Prevention Lifeline: 443 107 6692 or TTY: 959-815-5988 TTY 931-412-6679) to talk to a trained counselor call 1-800-273-TALK (toll free, 24 hour hotline) if experiencing a Mental Health or [place]  check blood pressure weekly learn about high blood pressure call doctor for signs and symptoms of high blood pressure keep all doctor appointments take medications for blood pressure exactly as prescribed report new symptoms to your doctor  Chronic Condition 2: Stroke Ischemic/TIA Provider Assessment and Plan  History CVA Memory deficit Continue on DAPT therapy and med management Continue to work with CCM team to help improve outcomes, issues with adherence meds/apt in the past.        Expected Outcome/Goals Addressed This Visit (Provider CCM goals/Provider Assessment and plan   CCM Expected Outcome:  Monitor, Self-Manage and Reduce Symptoms of [place] Ischemic/TIA    Symptom Management Condition 2: Take all medications as prescribed Attend all scheduled provider appointments Call provider office for new concerns or questions  call the Suicide and  Crisis Lifeline: 988 call the [place] National Suicide Prevention Lifeline: (640) 118-7468 or TTY: 617-087-6735 TTY (236)672-5733) to talk to a trained counselor call 1-800-273-TALK (toll free, 24 hour hotline) if experiencing a Mental Health or [place]   Problem List Patient Active Problem List   Diagnosis Date Noted   Cognitive deficit as late effect of cerebrovascular accident (CVA) 06/25/2020   Imbalance 02/01/2018   Humeral head fracture, left, closed, initial encounter 09/22/2017   History of cerebrovascular accident (CVA) with residual deficit 09/09/2017   CKD (chronic kidney disease), stage III ([place]) 09/01/2017   Elevated PSA, between 10 and less than 20 ng/ml 07/22/2016   Bilateral lower extremity edema 07/22/2016   Abnormal glucose 07/15/2016   BPH with obstruction/lower urinary tract symptoms 04/10/2016   Benign hypertension with CKD (chronic kidney disease) stage III ([place]) 04/10/2016   Memory loss 04/10/2016   Gastric outlet obstruction 12/02/2014   Generalized abdominal pain    Benign fibroma of prostate 01/02/2011   HLD (hyperlipidemia) 01/02/2011   Obesity (BMI 30.0-34.9) 01/02/2011    Medication Management  Current Outpatient Medications:    aspirin EC 81 MG tablet, Take 1 tablet (81 mg total) by mouth in the morning. (blood thinner), Disp: 90 tablet, Rfl: 3   atorvastatin (LIPITOR) 40 MG tablet, Take 1 tablet (40 mg total) by mouth daily., Disp: 90 tablet, Rfl: 3   clopidogrel (PLAVIX) 75 MG tablet, TAKE 1 TABLET BY MOUTH IN THE MORNING (BLOOD  THINNER), Disp: 90 tablet, Rfl: 0   finasteride (PROSCAR) 5 MG tablet, Take 1 tablet by mouth once daily, Disp: 90 tablet, Rfl: 0   lisinopril (ZESTRIL) 20 MG tablet, TAKE 1 TABLET BY MOUTH IN THE MORNING FOR BLOOD PRESSURE, Disp: 90 tablet, Rfl: 0  Multiple Vitamins-Minerals (MULTIVITAMIN ADULTS 50+ PO), Take by mouth., Disp: , Rfl:    Omega-3 1000 MG CAPS, Take 1 capsule by mouth in the morning., Disp: , Rfl:    Cognitive Assessment Identity Confirmed: : Name; DOB Cognitive Status: Abnormal (the patient memory not good, gets forgetful) Other:  : The patient able to state his name and birthday, his wife he says is getting worse with her memory   Functional Assessment Hearing Difficulty or Deaf: no Wear Glasses or Blind: no Concentrating, Remembering or Making Decisions Difficulty (CP): yes Concentration Management: The patient has short term memory loss, the patient denies any acute changes in his memory Difficulty Communicating: no Difficulty Eating/Swallowing: no Walking or Climbing Stairs Difficulty: no Dressing/Bathing Difficulty: no Doing Errands Independently Difficulty (such as shopping) (CP): no Change in Functional Status Since Onset of Current Illness/Injury: no   Caregiver Assessment  Primary Source of Support/Comfort: friend Name of Support/Comfort Primary Source: [name] People in Home: spouse Name(s) of People in Home: [place]- patients wife Family Caregiver if Needed: friend(s) Family Caregiver Names: Laroy Apple assist the patient as needed Primary Roles/Responsibilities: retired   Planned Interventions  Evaluation of current treatment plan related to effective management of past history of CVA and TIA with memory loss, especially short term memory loss and patient's adherence to plan as established by provider Advised patient to call the office for changes in mood, anxiety, depression, new questions and concerns related to past history of stroke with memory loss Provided education to patient re: calling for changes in his condition or new concerns related to his health and well being Reviewed medications with patient and discussed compliance. The patient is compliant with his medications. Has had issues in the past with managing his medications but states he is compliant at this time Provided patient with doing crossword puzzles, memory recall activities and  creating routines  educational materials related to history of CVA with memory loss and effective management of deficits related to memory loss. Reviewed scheduled/upcoming provider appointments including 05-11-2021 at 0820 am Discussed plans with patient for ongoing care management follow up and provided patient with direct contact information for care management team Advised patient to discuss changes in his memory  with provider Evaluation of current treatment plan related to hypertension self management and patient's adherence to plan as established by provider;   Provided education to patient re: stroke prevention, s/s of heart attack and stroke; Reviewed prescribed diet heart healthy diet  Reviewed medications with patient and discussed importance of compliance;  Discussed plans with patient for ongoing care management follow up and provided patient with direct contact information for care management team; Advised patient, providing education and rationale, to monitor blood pressure daily and record, calling PCP for findings outside established parameters;  Reviewed scheduled/upcoming provider appointments including: 05-11-2021 at [place] am Advised patient to discuss changes in blood pressures or heart health with provider; Provided education on prescribed diet heart healthy;  Discussed complications of poorly controlled blood pressure such as heart disease, stroke, circulatory complications, vision complications, kidney impairment, sexual dysfunction;  Screening for signs and symptoms of depression related to chronic disease state;  Assessed social determinant of health barriers;   Interaction and coordination with outside resources, practitioners, and providers See CCM Referral  Care Plan: Patient declined

## 2022-06-22 ENCOUNTER — Emergency Department: Payer: PPO

## 2022-06-22 ENCOUNTER — Encounter: Payer: Self-pay | Admitting: Emergency Medicine

## 2022-06-22 ENCOUNTER — Inpatient Hospital Stay
Admission: EM | Admit: 2022-06-22 | Discharge: 2022-06-26 | DRG: 493 | Disposition: A | Discharge status: Skilled Nursing Facility | Payer: PPO | Attending: Internal Medicine | Admitting: Internal Medicine

## 2022-06-22 ENCOUNTER — Emergency Department: Payer: TRICARE For Life (TFL)

## 2022-06-22 ENCOUNTER — Other Ambulatory Visit: Payer: Self-pay

## 2022-06-22 DIAGNOSIS — E785 Hyperlipidemia, unspecified: Secondary | ICD-10-CM | POA: Diagnosis not present

## 2022-06-22 DIAGNOSIS — R2681 Unsteadiness on feet: Secondary | ICD-10-CM | POA: Diagnosis not present

## 2022-06-22 DIAGNOSIS — N138 Other obstructive and reflux uropathy: Secondary | ICD-10-CM | POA: Diagnosis present

## 2022-06-22 DIAGNOSIS — E872 Acidosis, unspecified: Secondary | ICD-10-CM | POA: Diagnosis present

## 2022-06-22 DIAGNOSIS — Y92239 Unspecified place in hospital as the place of occurrence of the external cause: Secondary | ICD-10-CM | POA: Diagnosis present

## 2022-06-22 DIAGNOSIS — Z8249 Family history of ischemic heart disease and other diseases of the circulatory system: Secondary | ICD-10-CM

## 2022-06-22 DIAGNOSIS — T1490XA Injury, unspecified, initial encounter: Secondary | ICD-10-CM | POA: Diagnosis not present

## 2022-06-22 DIAGNOSIS — S82202A Unspecified fracture of shaft of left tibia, initial encounter for closed fracture: Secondary | ICD-10-CM

## 2022-06-22 DIAGNOSIS — N400 Enlarged prostate without lower urinary tract symptoms: Secondary | ICD-10-CM | POA: Diagnosis not present

## 2022-06-22 DIAGNOSIS — Z87891 Personal history of nicotine dependence: Secondary | ICD-10-CM

## 2022-06-22 DIAGNOSIS — Z4789 Encounter for other orthopedic aftercare: Secondary | ICD-10-CM | POA: Diagnosis not present

## 2022-06-22 DIAGNOSIS — Z7401 Bed confinement status: Secondary | ICD-10-CM | POA: Diagnosis not present

## 2022-06-22 DIAGNOSIS — N179 Acute kidney failure, unspecified: Secondary | ICD-10-CM | POA: Diagnosis not present

## 2022-06-22 DIAGNOSIS — D72829 Elevated white blood cell count, unspecified: Secondary | ICD-10-CM | POA: Diagnosis not present

## 2022-06-22 DIAGNOSIS — S82402D Unspecified fracture of shaft of left fibula, subsequent encounter for closed fracture with routine healing: Secondary | ICD-10-CM | POA: Diagnosis not present

## 2022-06-22 DIAGNOSIS — S9302XA Subluxation of left ankle joint, initial encounter: Secondary | ICD-10-CM | POA: Diagnosis not present

## 2022-06-22 DIAGNOSIS — J309 Allergic rhinitis, unspecified: Secondary | ICD-10-CM | POA: Diagnosis not present

## 2022-06-22 DIAGNOSIS — I69319 Unspecified symptoms and signs involving cognitive functions following cerebral infarction: Secondary | ICD-10-CM

## 2022-06-22 DIAGNOSIS — I129 Hypertensive chronic kidney disease with stage 1 through stage 4 chronic kidney disease, or unspecified chronic kidney disease: Secondary | ICD-10-CM | POA: Diagnosis present

## 2022-06-22 DIAGNOSIS — N183 Chronic kidney disease, stage 3 unspecified: Secondary | ICD-10-CM | POA: Diagnosis not present

## 2022-06-22 DIAGNOSIS — S82852A Displaced trimalleolar fracture of left lower leg, initial encounter for closed fracture: Secondary | ICD-10-CM | POA: Diagnosis not present

## 2022-06-22 DIAGNOSIS — E7800 Pure hypercholesterolemia, unspecified: Secondary | ICD-10-CM | POA: Diagnosis present

## 2022-06-22 DIAGNOSIS — S82202D Unspecified fracture of shaft of left tibia, subsequent encounter for closed fracture with routine healing: Secondary | ICD-10-CM | POA: Diagnosis not present

## 2022-06-22 DIAGNOSIS — N401 Enlarged prostate with lower urinary tract symptoms: Secondary | ICD-10-CM | POA: Diagnosis not present

## 2022-06-22 DIAGNOSIS — Z6832 Body mass index (BMI) 32.0-32.9, adult: Secondary | ICD-10-CM | POA: Diagnosis not present

## 2022-06-22 DIAGNOSIS — N1831 Chronic kidney disease, stage 3a: Secondary | ICD-10-CM | POA: Diagnosis present

## 2022-06-22 DIAGNOSIS — S99812A Other specified injuries of left ankle, initial encounter: Secondary | ICD-10-CM | POA: Diagnosis not present

## 2022-06-22 DIAGNOSIS — S82402A Unspecified fracture of shaft of left fibula, initial encounter for closed fracture: Secondary | ICD-10-CM | POA: Diagnosis not present

## 2022-06-22 DIAGNOSIS — R41841 Cognitive communication deficit: Secondary | ICD-10-CM | POA: Diagnosis not present

## 2022-06-22 DIAGNOSIS — S82842D Displaced bimalleolar fracture of left lower leg, subsequent encounter for closed fracture with routine healing: Secondary | ICD-10-CM | POA: Diagnosis not present

## 2022-06-22 DIAGNOSIS — W010XXA Fall on same level from slipping, tripping and stumbling without subsequent striking against object, initial encounter: Secondary | ICD-10-CM | POA: Diagnosis not present

## 2022-06-22 DIAGNOSIS — G319 Degenerative disease of nervous system, unspecified: Secondary | ICD-10-CM | POA: Diagnosis not present

## 2022-06-22 DIAGNOSIS — E669 Obesity, unspecified: Secondary | ICD-10-CM | POA: Diagnosis present

## 2022-06-22 DIAGNOSIS — W19XXXA Unspecified fall, initial encounter: Secondary | ICD-10-CM

## 2022-06-22 DIAGNOSIS — Z8673 Personal history of transient ischemic attack (TIA), and cerebral infarction without residual deficits: Secondary | ICD-10-CM | POA: Diagnosis not present

## 2022-06-22 DIAGNOSIS — E876 Hypokalemia: Secondary | ICD-10-CM | POA: Diagnosis not present

## 2022-06-22 DIAGNOSIS — S82892A Other fracture of left lower leg, initial encounter for closed fracture: Secondary | ICD-10-CM | POA: Diagnosis not present

## 2022-06-22 DIAGNOSIS — M6259 Muscle wasting and atrophy, not elsewhere classified, multiple sites: Secondary | ICD-10-CM | POA: Diagnosis not present

## 2022-06-22 DIAGNOSIS — Z79899 Other long term (current) drug therapy: Secondary | ICD-10-CM

## 2022-06-22 DIAGNOSIS — D291 Benign neoplasm of prostate: Secondary | ICD-10-CM | POA: Diagnosis not present

## 2022-06-22 DIAGNOSIS — S82851A Displaced trimalleolar fracture of right lower leg, initial encounter for closed fracture: Secondary | ICD-10-CM | POA: Diagnosis not present

## 2022-06-22 DIAGNOSIS — I6930 Unspecified sequelae of cerebral infarction: Secondary | ICD-10-CM

## 2022-06-22 DIAGNOSIS — I10 Essential (primary) hypertension: Secondary | ICD-10-CM | POA: Diagnosis not present

## 2022-06-22 LAB — CBC WITH DIFFERENTIAL/PLATELET
Abs Immature Granulocytes: 0.03 10*3/uL (ref 0.00–0.07)
Basophils Absolute: 0.1 10*3/uL (ref 0.0–0.1)
Basophils Relative: 1 %
Eosinophils Absolute: 0.6 10*3/uL — ABNORMAL HIGH (ref 0.0–0.5)
Eosinophils Relative: 6 %
HCT: 37 % — ABNORMAL LOW (ref 39.0–52.0)
Hemoglobin: 12.1 g/dL — ABNORMAL LOW (ref 13.0–17.0)
Immature Granulocytes: 0 %
Lymphocytes Relative: 23 %
Lymphs Abs: 2.3 10*3/uL (ref 0.7–4.0)
MCH: 32.6 pg (ref 26.0–34.0)
MCHC: 32.7 g/dL (ref 30.0–36.0)
MCV: 99.7 fL (ref 80.0–100.0)
Monocytes Absolute: 0.8 10*3/uL (ref 0.1–1.0)
Monocytes Relative: 8 %
Neutro Abs: 6.2 10*3/uL (ref 1.7–7.7)
Neutrophils Relative %: 62 %
Platelets: 155 10*3/uL (ref 150–400)
RBC: 3.71 MIL/uL — ABNORMAL LOW (ref 4.22–5.81)
RDW: 13.2 % (ref 11.5–15.5)
WBC: 10 10*3/uL (ref 4.0–10.5)
nRBC: 0 % (ref 0.0–0.2)

## 2022-06-22 LAB — COMPREHENSIVE METABOLIC PANEL
ALT: 16 U/L (ref 0–44)
AST: 20 U/L (ref 15–41)
Albumin: 3.8 g/dL (ref 3.5–5.0)
Alkaline Phosphatase: 61 U/L (ref 38–126)
Anion gap: 8 (ref 5–15)
BUN: 25 mg/dL — ABNORMAL HIGH (ref 8–23)
CO2: 27 mmol/L (ref 22–32)
Calcium: 8.7 mg/dL — ABNORMAL LOW (ref 8.9–10.3)
Chloride: 103 mmol/L (ref 98–111)
Creatinine, Ser: 1.06 mg/dL (ref 0.61–1.24)
GFR, Estimated: >60 mL/min (ref >60)
Glucose, Bld: 96 mg/dL (ref 70–99)
Potassium: 4.2 mmol/L (ref 3.5–5.1)
Sodium: 138 mmol/L (ref 135–145)
Total Bilirubin: 0.5 mg/dL (ref 0.3–1.2)
Total Protein: 6.9 g/dL (ref 6.5–8.1)

## 2022-06-22 MED ORDER — ASPIRIN 81 MG PO TBEC
81.0000 mg | DELAYED_RELEASE_TABLET | Freq: Every morning | ORAL | Status: DC
  Administered 2022-06-24: 81 mg via ORAL
  Filled 2022-06-22: qty 1

## 2022-06-22 MED ORDER — CLOPIDOGREL BISULFATE 75 MG PO TABS: 75.0000 mg | ORAL_TABLET | Freq: Every day | ORAL | Status: DC

## 2022-06-22 MED ORDER — ACETAMINOPHEN 325 MG PO TABS
650.0000 mg | ORAL_TABLET | Freq: Four times a day (QID) | ORAL | Status: DC | PRN
  Filled 2022-06-22: qty 2

## 2022-06-22 MED ORDER — CEFAZOLIN SODIUM-DEXTROSE 2-4 GM/100ML-% IV SOLN
2.0000 g | INTRAVENOUS | Status: AC
  Administered 2022-06-23: 2 g via INTRAVENOUS

## 2022-06-22 MED ORDER — OXYCODONE HCL 5 MG PO TABS: 5.0000 mg | ORAL_TABLET | Freq: Four times a day (QID) | ORAL | Status: DC | PRN

## 2022-06-22 MED ORDER — SENNOSIDES-DOCUSATE SODIUM 8.6-50 MG PO TABS: 1.0000 | ORAL_TABLET | Freq: Every evening | ORAL | Status: DC | PRN

## 2022-06-22 MED ORDER — ACETAMINOPHEN 650 MG RE SUPP: 650.0000 mg | Freq: Four times a day (QID) | RECTAL | Status: DC | PRN

## 2022-06-22 MED ORDER — ATORVASTATIN CALCIUM 20 MG PO TABS
40.0000 mg | ORAL_TABLET | Freq: Every day | ORAL | Status: DC
  Administered 2022-06-22 – 2022-06-25 (×4): 40 mg via ORAL
  Filled 2022-06-22 (×4): qty 2

## 2022-06-22 MED ORDER — ASPIRIN 81 MG PO TBEC: 81.0000 mg | DELAYED_RELEASE_TABLET | Freq: Every morning | ORAL | Status: DC

## 2022-06-22 MED ORDER — LISINOPRIL 20 MG PO TABS
20.0000 mg | ORAL_TABLET | Freq: Every day | ORAL | Status: DC
  Administered 2022-06-23 – 2022-06-25 (×3): 20 mg via ORAL
  Filled 2022-06-22 (×3): qty 1

## 2022-06-22 MED ORDER — FINASTERIDE 5 MG PO TABS
5.0000 mg | ORAL_TABLET | Freq: Every day | ORAL | Status: DC
  Administered 2022-06-23 – 2022-06-26 (×4): 5 mg via ORAL
  Filled 2022-06-22 (×4): qty 1

## 2022-06-22 MED ORDER — ONDANSETRON HCL 4 MG/2ML IJ SOLN: 4.0000 mg | Freq: Four times a day (QID) | INTRAMUSCULAR | Status: DC | PRN

## 2022-06-22 MED ORDER — MORPHINE SULFATE (PF) 2 MG/ML IV SOLN: 2.0000 mg | INTRAVENOUS | Status: DC | PRN

## 2022-06-22 MED ORDER — ONDANSETRON HCL 4 MG PO TABS: 4.0000 mg | ORAL_TABLET | Freq: Four times a day (QID) | ORAL | Status: DC | PRN

## 2022-06-22 NOTE — Assessment & Plan Note (Signed)
-   Atorvastatin 40 mg nightly resumed 

## 2022-06-22 NOTE — Assessment & Plan Note (Signed)
-   Lisinopril 20 mg daily resumed

## 2022-06-22 NOTE — Assessment & Plan Note (Signed)
-   Resumed home finasteride 5 mg daily

## 2022-06-22 NOTE — Consult Note (Addendum)
ORTHOPAEDIC CONSULTATION  REQUESTING PHYSICIAN: Cox, Amy N, DO  Chief Complaint: Left ankle pain  HPI: [name] is a 88 y.o. male who complains of left ankle pain after tripping on the threshold of the door going into his house today.  He fell onto his left side.  The patient had left ankle pain and was unable to bear weight after his fall.  X-rays in the emergency department revealed a displaced trimalleolar ankle fracture.  Patient underwent a closed reduction and splinting in the ER.  Orthopedics is consulted for management of his fracture.  Patient currently denies any pain.  Patient denies other injuries.  Patient denies numbness or tingling in his left lower extremity.  Past Medical History:  Diagnosis Date   Bowel obstruction (HCC)    BPH (benign prostatic hypertrophy)    Gastric outlet obstruction    High cholesterol    Hyperchloremia    Hypertension    Pancreatitis    Past Surgical History:  Procedure Laterality Date   ESOPHAGOGASTRODUODENOSCOPY N/A 12/03/2014   Procedure: ESOPHAGOGASTRODUODENOSCOPY (EGD);  Surgeon: Hulen Luster, MD;  Location: [facility] ENDOSCOPY;  Service: Endoscopy;  Laterality: N/A;   Social History   Socioeconomic History   Marital status: Married    Spouse name: Not on file   Number of children: Not on file   Years of education: Not on file   Highest education level: Not on file  Occupational History   Occupation: retired  Tobacco Use   Smoking status: Former    Types: Cigarettes    Quit date: 1985    Years since quitting: 39.1    Passive exposure: Past   Smokeless tobacco: Former  Scientific laboratory technician Use: Never used  Substance and Sexual Activity   Alcohol use: No   Drug use: No   Sexual activity: Never  Other Topics Concern   Not on file  Social History Narrative   Not on file   Social Determinants of Health   Financial Resource Strain: [place]  (02/16/2022)   Overall Financial Resource Strain (CARDIA)    Difficulty of Paying  Living Expenses: Not hard at all  Food Insecurity: No Food Insecurity (02/16/2022)   Hunger Vital Sign    Worried About Running Out of Food in the Last Year: Never true    [place] in the Last Year: Never true  Transportation Needs: No Transportation Needs (02/16/2022)   PRAPARE - Hydrologist (Medical): No    Lack of Transportation (Non-Medical): No  Physical Activity: Sufficiently Active (02/16/2022)   Exercise Vital Sign    Days of Exercise per Week: 6 days    Minutes of Exercise per Session: 30 min  Stress: No Stress Concern Present (02/16/2022)   [place]    Feeling of Stress : Not at all  Social Connections: [place] (02/16/2022)   Social Connection and Isolation Panel [NHANES]    Frequency of Communication with Friends and Family: Twice a week    Frequency of Social Gatherings with Friends and Family: Once a week    Attends Religious Services: More than 4 times per year    Active Member of [employer] or Organizations: Yes    Attends Archivist Meetings: Never    Marital Status: Married   Family History  Problem Relation Age of Onset   Heart disease Mother    Heart disease Father    Heart  disease Brother    No Known Allergies Prior to Admission medications   Medication Sig Start Date End Date Taking? Authorizing Provider  aspirin EC 81 MG tablet Take 1 tablet (81 mg total) by mouth in the morning. (blood thinner) 06/25/20  Yes Karamalegos, Devonne Doughty, DO  atorvastatin (LIPITOR) 40 MG tablet Take 1 tablet (40 mg total) by mouth daily. 05/29/22  Yes Karamalegos, Devonne Doughty, DO  clopidogrel (PLAVIX) 75 MG tablet TAKE 1 TABLET BY MOUTH IN THE MORNING (BLOOD  THINNER) 05/28/22  Yes Karamalegos, Devonne Doughty, DO  finasteride (PROSCAR) 5 MG tablet Take 1 tablet by mouth once daily 05/28/22  Yes Karamalegos, Alexander J, DO  lisinopril (ZESTRIL) 20 MG tablet TAKE 1  TABLET BY MOUTH IN THE MORNING FOR BLOOD PRESSURE 05/28/22  Yes Karamalegos, Devonne Doughty, DO  Multiple Vitamins-Minerals (MULTIVITAMIN ADULTS 50+ PO) Take by mouth.   Yes [provider]  Omega-3 1000 MG CAPS Take 1 capsule by mouth in the morning.   Yes [provider]   CT Cervical Spine Wo Contrast  Result Date: 06/22/2022 CLINICAL DATA:  Trauma, fall EXAM: CT CERVICAL SPINE WITHOUT CONTRAST TECHNIQUE: Multidetector CT imaging of the cervical spine was performed without intravenous contrast. Multiplanar CT image reconstructions were also generated. RADIATION DOSE REDUCTION: This exam was performed according to the departmental dose-optimization program which includes automated exposure control, adjustment of the mA and/or kV according to patient size and/or use of iterative reconstruction technique. COMPARISON:  CT angiogram of neck done on 09/09/2017 FINDINGS: Alignment: There is old ununited fracture in the base of odontoid process of C2 vertebra. Posterior margin of odontoid process is 12 mm posterior to the body of C2 vertebra. This finding has not changed. Skull base and vertebrae: No recent fracture is seen. Soft tissues and spinal canal: There is extrinsic pressure over the ventral margin of thecal sac caused by bony spurs, more so on the left side at C6-C7 level. Disc levels: There is encroachment of neural foramina by bony spurs from C3 to C7 levels. Upper chest: No acute findings are seen. Other: None. IMPRESSION: There is old displaced ununited fracture in base of odontoid process with no significant interval change since 09/09/2017. No recent fracture is seen in cervical spine.  Cervical spondylosis. Electronically Signed   By: Elmer Picker M.D.   On: 06/22/2022 15:13   DG Ankle 2 Views Left  Result Date: 06/22/2022 CLINICAL DATA:  Status post reduction. EXAM: LEFT ANKLE - 2 VIEW COMPARISON:  Same day. FINDINGS: The left ankle has been casted and immobilized. There is  mildly improved alignment of the distal tibial and fibular fractures, although significant displacement remains. IMPRESSION: Status post casting and immobilization of moderately displaced distal left tibial and fibular fractures. Electronically Signed   By: Marijo Conception M.D.   On: 06/22/2022 15:10   CT Head Wo Contrast  Result Date: 06/22/2022 CLINICAL DATA:  Trauma, fall EXAM: CT HEAD WITHOUT CONTRAST TECHNIQUE: Contiguous axial images were obtained from the base of the skull through the vertex without intravenous contrast. RADIATION DOSE REDUCTION: This exam was performed according to the departmental dose-optimization program which includes automated exposure control, adjustment of the mA and/or kV according to patient size and/or use of iterative reconstruction technique. COMPARISON:  09/08/2017 FINDINGS: Brain: No acute intracranial findings are seen. There are no signs of bleeding within the cranium. Cortical sulci are prominent. There is decreased density in periventricular and subcortical white matter. Vascular: Unremarkable. Skull: No fracture is seen in  calvarium. Sinuses/Orbits: There are no air-fluid levels. There is mild mucosal thickening in ethmoid sinus. Other: None. IMPRESSION: No acute intracranial findings are seen in noncontrast CT brain. Atrophy. Small-vessel disease. Electronically Signed   By: Elmer Picker M.D.   On: 06/22/2022 15:05   DG Ankle Complete Left  Result Date: 06/22/2022 CLINICAL DATA:  Left ankle pain after falling. EXAM: LEFT ANKLE COMPLETE - 3+ VIEW COMPARISON:  None Available. FINDINGS: There is a mildly comminuted transverse fracture through the base of the medial malleolus which is moderately displaced laterally. Oblique fracture of the distal fibular metadiaphysis is also moderately displaced laterally and posteriorly. There is a probable minimally displaced fracture of the posterior malleolus, best seen on the lateral view. The talus is laterally subluxed  relative to the tibial plafond. No frank dislocation or tarsal bone fracture identified. Moderate surrounding soft tissue swelling. IMPRESSION: Trimalleolar fracture subluxation of the left ankle as described. Electronically Signed   By: Richardean Sale M.D.   On: 06/22/2022 13:12    Positive ROS: All other systems have been reviewed and were otherwise negative with the exception of those mentioned in the HPI and as above.  Physical Exam: General: Alert, no acute distress  MUSCULOSKELETAL: Left lower extremity: Patient has an AO splint in place which is clean and dry.  There is no obvious deformity.  Patient can flex and extend toes on his left foot.  He has intact sensation light touch in his toes are well-perfused.  Assessment: Left displaced trimalleolar ankle fracture  Plan: I met with the patient in his hospital room this evening.  Patient was having any significant pain in his left ankle.  I explained to the patient the nature of his fracture.  I recommended open reduction internal fixation as definitive surgical treatment for his injury.  I reviewed the details of the operation as well as the postoperative course with the patient. I discussed the risks and benefits of surgery. The risks include but are not limited to infection, bleeding, nerve or blood vessel injury, joint stiffness or loss of motion, persistent pain, weakness or instability, malunion, nonunion and hardware failure and the need for further surgery.  Patient understood these risks and wished to proceed.   Stop the patient's Plavix in preparation for surgery tomorrow.  Patient will be n.p.o. after midnight.  I have reviewed his x-rays and labs in preparation for this case.  I answered all the patient's questions.    [place], MD    06/22/2022 5:52 PM

## 2022-06-22 NOTE — Assessment & Plan Note (Addendum)
Closed distal fracture of left tibia and fibula - Orthopedic service has been consulted, Dr. Mack Guise, who plans to take patient to the OR in the a.m. - Symptomatic support: Acetaminophen 650 mg p.o./rectal every 6 hours as needed for mild pain, 5 days ordered; oxycodone 5 mg every 6 hours as needed for moderate pain, 3 doses ordered; morphine 2 mg IV every 6 q4 hours, 3 doses ordered - Day team to reevaluate patient for continued opioid pain requirements - N.p.o. after midnight

## 2022-06-22 NOTE — ED Provider Notes (Signed)
[facility] ([facility]) Provider Note    Event Date/Time   First MD Initiated Contact with Patient 06/22/22 1246     (approximate)   History   Chief Complaint Fall   HPI  [name] is a 88 y.o. male with past medical history of hypertension, hyperlipidemia, stroke, and CKD who presents to the ED following fall.  Patient reports that he was trying to walk in a door in his home when he tripped and twisted his left ankle.  He fell to the ground and has been unable to bear weight on the ankle since then.  He admits hitting his head but denies any loss of consciousness, currently takes Plavix.  He denies any pain in his chest, abdomen, or other extremities.     Physical Exam   Triage Vital Signs: ED Triage Vitals  Enc Vitals Group     BP 06/22/22 1229 (!) 141/69     Pulse Rate 06/22/22 1229 64     Resp 06/22/22 1229 18     Temp 06/22/22 1229 97.6 F (36.4 C)     Temp Source 06/22/22 1229 Oral     SpO2 06/22/22 1229 96 %     Weight --      Height --      Head Circumference --      Peak Flow --      Pain Score 06/22/22 1230 10     Pain Loc --      Pain Edu? --      Excl. in [place]? --     Most recent vital signs: Vitals:   06/22/22 1254 06/22/22 1255  BP:  (!) 140/84  Pulse:  62  Resp:  17  Temp:  97.6 F (36.4 C)  SpO2: 98% 98%    Constitutional: Alert and oriented. Eyes: Conjunctivae are normal. Head: Atraumatic. Nose: No congestion/rhinnorhea. Mouth/Throat: Mucous membranes are moist.  Cardiovascular: Normal rate, regular rhythm. Grossly normal heart sounds.  2+ radial and DP pulses bilaterally. Respiratory: Normal respiratory effort.  No retractions. Lungs CTAB. Gastrointestinal: Soft and nontender. No distention. Musculoskeletal: Edema with obvious deformity to left ankle, tenderness to palpation noted over medial and lateral malleoli.  No tenderness to palpation at metatarsals.  Range of motion intact to toes of left foot. Neurologic:  Normal  speech and language. No gross focal neurologic deficits are appreciated.    ED Results / Procedures / Treatments   Labs (all labs ordered are listed, but only abnormal results are displayed) Labs Reviewed  CBC WITH DIFFERENTIAL/PLATELET  BASIC METABOLIC PANEL     EKG  ED ECG REPORT I, Blake Divine, the attending physician, personally viewed and interpreted this ECG.   Date: 06/22/2022  EKG Time: 14:21  Rate: 60  Rhythm: normal sinus rhythm  Axis: Normal  Intervals:none  ST&T Change: None  RADIOLOGY Left ankle x-ray reviewed and interpreted by me with trimalleolar fracture with lateral displacement.  PROCEDURES:  Critical Care performed: No  .Ortho Injury Treatment  Date/Time: 06/22/2022 3:20 PM  Performed by: Blake Divine, MD Authorized by: Blake Divine, MD   Consent:    Consent obtained:  Verbal   Consent given by:  Patient   Risks discussed:  Fracture, nerve damage, restricted joint movement, vascular damage, stiffness, recurrent dislocation and irreducible dislocationInjury location: ankle Location details: left ankle Injury type: fracture-dislocation Fracture type: trimalleolar Pre-procedure neurovascular assessment: neurovascularly intact Pre-procedure distal perfusion: normal Pre-procedure neurological function: normal Pre-procedure range of motion: normal  Anesthesia: Local anesthesia  used: no  Patient sedated: NoManipulation performed: yes Skin traction used: yes Skeletal traction used: yes Reduction successful: yes X-ray confirmed reduction: yes Immobilization: splint Splint type: ankle stirrup and short leg Splint Applied by: ED Tech Supplies used: Ortho-Glass Post-procedure neurovascular assessment: post-procedure neurovascularly intact Post-procedure distal perfusion: normal Post-procedure neurological function: normal Post-procedure range of motion: normal      MEDICATIONS ORDERED IN ED: Medications - No data to  display   IMPRESSION / MDM / [place] / ED COURSE  I reviewed the triage vital signs and the nursing notes.                              88 y.o. male with past medical history of hypertension, hyperlipidemia, CKD, and stroke who presents to the ED complaining of trip and fall at home with injury to his left ankle as well as hitting his head.  Patient's presentation is most consistent with acute presentation with potential threat to life or bodily function.  Differential diagnosis includes, but is not limited to, ankle fracture, ankle dislocation, neurovascular compromise, intracranial process, scalp contusion, cervical spine injury.  Patient nontoxic-appearing and in no acute distress, vital signs are unremarkable.  He has obvious deformity to his left ankle but remains neurovascularly intact distally with strong DP pulse.  Ankle x-ray shows trimalleolar fracture with lateral displacement, findings discussed with Dr. Mack Guise of orthopedics who recommends reduction and splint placement with follow-up x-rays.  He recommends admission to the medical service for operative intervention tomorrow.  We will check CT head and cervical spine as well given head injury with the patient being anticoagulated on Plavix.  Plan to screen EKG and basic labs, patient declines pain medication despite being offered multiple times.  CT head and cervical spine are negative for acute process, left ankle was reduced, improved alignment noted on follow-up x-ray.  Dr. Joen Laura gave orthopedics recommends medical admission with plan for operative repair of left ankle tomorrow.  Case discussed with hospitalist for admission.      FINAL CLINICAL IMPRESSION(S) / ED DIAGNOSES   Final diagnoses:  Fall, initial encounter  Closed trimalleolar fracture of left ankle, initial encounter     Rx / DC Orders   ED Discharge Orders     None        Note:  This document was prepared using Dragon voice  recognition software and may include unintentional dictation errors.   Blake Divine, MD 06/22/22 [phone]

## 2022-06-22 NOTE — Hospital Course (Addendum)
[name] is an 88 year old male with history of hypertension, hyperlipidemia, BPH, who presents emergency department for chief concerns of tripping and falling.  Vitals in the ED showed temperature of 97.6, respiration rate of 17, heart rate 62, blood pressure 140/84, SpO2 of 98% on room air.  Serum Na 138, potassium 4.2, chloride 103, bicarb 27, nonfasting blood glucose 96, BUN 25, serum creatinine 1.06, EGFR greater than 60, WBC 10, hemoglobin 12.1, platelets of 155.  ED treatment: Patient is status post left knee reduction via EDP.  EDP consulted orthopedic service, Dr. Mack Guise who is requesting hospitalist admission in anticipation of OR on 06/23/2022.

## 2022-06-22 NOTE — ED Triage Notes (Signed)
Patient to ED via [facility] from home after a fall. Patient states he was walking through the door when he tripped. C/o left ankle pain- swelling noted but able to move toes. States he did hit head but denies LOC, unsure if on blood thinners.

## 2022-06-22 NOTE — H&P (Signed)
History and Physical   [name] [id number] DOB: [birth year] DOA: 06/22/2022  PCP: [name], DO  Outpatient Specialists: Dr. Diamantina Providence, urology Patient coming from: home via EMS  I have personally briefly reviewed patient's old medical records in [place].  Chief Concern:   HPI: [name] is an 88 year old male with history of hypertension, hyperlipidemia, BPH, who presents emergency department for chief concerns of tripping and falling.  Vitals in the ED showed temperature of 97.6, respiration rate of 17, heart rate 62, blood pressure 140/84, SpO2 of 98% on room air.  Serum Na 138, potassium 4.2, chloride 103, bicarb 27, nonfasting blood glucose 96, BUN 25, serum creatinine 1.06, EGFR greater than 60, WBC 10, hemoglobin 12.1, platelets of 155.  ED treatment: Patient is status post left knee reduction via EDP.  EDP consulted orthopedic service, Dr. Mack Guise who is requesting hospitalist admission in anticipation of OR on 06/23/2022. ------------------------------ At bedside, he was able to tell me his name, age, current location, current year.   He was walking towards the door, there was a ledge, he was swinging his leg about to cross the ledge, and felt like his leg became marbles.   He denies chest pain, shortness of breath, dysuria, diarrhea, hemautria, blood in stool, nausea, vomiting.   He denies being in pain at this time.  Social history: He lives with his wife of 12 years. He denies tobacco use, He formerly smoked, quitting in 1995. He denies etoh and recreational drug use. He is retired and formerly was an Film/video editor.   ROS: Constitutional: no weight change, no fever ENT/Mouth: no sore throat, no rhinorrhea Eyes: no eye pain, no vision changes Cardiovascular: no chest pain, no dyspnea,  no edema, no palpitations Respiratory: no cough, no sputum, no wheezing Gastrointestinal: + nausea, no vomiting, no diarrhea, no constipation Genitourinary: no  urinary incontinence, no dysuria, no hematuria Musculoskeletal: no arthralgias, + myalgias Skin: no skin lesions, no pruritus, Neuro: + weakness, no loss of consciousness, no syncope Psych: no anxiety, no depression, + decrease appetite Heme/Lymph: no bruising, no bleeding  ED Course: Discussed with emergency medicine provider, patient requiring hospitalization for chief concerns of left tibia and fibula close fracture.  Assessment/Plan  Principal Problem:   Fracture of left tibia and fibula, closed, initial encounter Active Problems:   Benign fibroma of prostate   HLD (hyperlipidemia)   BPH with obstruction/lower urinary tract symptoms   Benign hypertension with CKD (chronic kidney disease) stage III (HCC)   History of cerebrovascular accident (CVA) with residual deficit   Cognitive deficit as late effect of cerebrovascular accident (CVA)   Assessment and Plan:  * Fracture of left tibia and fibula, closed, initial encounter Closed distal fracture of left tibia and fibula - Orthopedic service has been consulted, Dr. Mack Guise, who plans to take patient to the OR in the a.m. - Symptomatic support: Acetaminophen 650 mg p.o./rectal every 6 hours as needed for mild pain, 5 days ordered; oxycodone 5 mg every 6 hours as needed for moderate pain, 3 doses ordered; morphine 2 mg IV every 6 q4 hours, 3 doses ordered - Day team to reevaluate patient for continued opioid pain requirements - N.p.o. after midnight  History of cerebrovascular accident (CVA) with residual deficit - Aspirin 81 mg resumed for 06/24/2022, atorvastatin 40 mg nightly  Benign hypertension with CKD (chronic kidney disease) stage III (HCC) - Lisinopril 20 mg daily resumed  BPH with obstruction/lower urinary tract symptoms - Resumed home finasteride 5 mg  daily  HLD (hyperlipidemia) - Atorvastatin 40 mg nightly resumed  Chart reviewed.   DVT prophylaxis: TED hose; AM team to initiate pharmacologic DVT prophylaxis  when the benefits outweigh the risk Code Status: full code Diet: Heart healthy; n.p.o. after midnight Family Communication: a phone call was offered, patient declined, stating that his wife already knows Disposition Plan: Pending clinical course Consults called: Orthopedic service Admission status: Inpatient, MedSurg  Past Medical History:  Diagnosis Date   Bowel obstruction ([place])    BPH (benign prostatic hypertrophy)    Gastric outlet obstruction    High cholesterol    Hyperchloremia    Hypertension    Pancreatitis    Past Surgical History:  Procedure Laterality Date   ESOPHAGOGASTRODUODENOSCOPY N/A 12/03/2014   Procedure: ESOPHAGOGASTRODUODENOSCOPY (EGD);  Surgeon: Hulen Luster, MD;  Location: [facility] ENDOSCOPY;  Service: Endoscopy;  Laterality: N/A;   Social History:  reports that he quit smoking about 39 years ago. His smoking use included cigarettes. He has been exposed to tobacco smoke. He has quit using smokeless tobacco. He reports that he does not drink alcohol and does not use drugs.  No Known Allergies Family History  Problem Relation Age of Onset   Heart disease Mother    Heart disease Father    Heart disease Brother    Family history: Family history reviewed and not pertinent.  Prior to Admission medications   Medication Sig Start Date End Date Taking? Authorizing Provider  aspirin EC 81 MG tablet Take 1 tablet (81 mg total) by mouth in the morning. (blood thinner) 06/25/20  Yes Karamalegos, Devonne Doughty, DO  atorvastatin (LIPITOR) 40 MG tablet Take 1 tablet (40 mg total) by mouth daily. 05/29/22  Yes Karamalegos, Devonne Doughty, DO  clopidogrel (PLAVIX) 75 MG tablet TAKE 1 TABLET BY MOUTH IN THE MORNING (BLOOD  THINNER) 05/28/22  Yes Karamalegos, Devonne Doughty, DO  finasteride (PROSCAR) 5 MG tablet Take 1 tablet by mouth once daily 05/28/22  Yes Karamalegos, Alexander J, DO  lisinopril (ZESTRIL) 20 MG tablet TAKE 1 TABLET BY MOUTH IN THE MORNING FOR BLOOD PRESSURE 05/28/22  Yes  Karamalegos, Devonne Doughty, DO  Multiple Vitamins-Minerals (MULTIVITAMIN ADULTS 50+ PO) Take by mouth.   Yes [provider]  Omega-3 1000 MG CAPS Take 1 capsule by mouth in the morning.   Yes [provider]   Physical Exam: Vitals:   06/22/22 1254 06/22/22 1255 06/22/22 1500 06/22/22 1557  BP:  (!) 140/84  (!) 165/85  Pulse:  62 69 75  Resp:  '17 18 17  '$ Temp:  97.6 F (36.4 C)  97.8 F (36.6 C)  TempSrc:  Oral  Oral  SpO2: 98% 98% 100% 98%   Constitutional: appears age appropriate, NAD, calm, comfortable Eyes: PERRL, lids and conjunctivae normal ENMT: Mucous membranes are moist. Posterior pharynx clear of any exudate or lesions. Age-appropriate dentition. Hearing appropriate Neck: normal, supple, no masses, no thyromegaly Respiratory: clear to auscultation bilaterally, no wheezing, no crackles. Normal respiratory effort. No accessory muscle use.  Cardiovascular: Regular rate and rhythm, no murmurs / rubs / gallops. No extremity edema. 2+ pedal pulses. No carotid bruits.  Abdomen: obese abdomen, no tenderness, no masses palpated, no hepatosplenomegaly. Bowel sounds positive.  Musculoskeletal: no clubbing / cyanosis. No joint deformity upper and lower extremities. Good ROM, no contractures, no atrophy. Normal muscle tone.  Skin: no rashes, lesions, ulcers. No induration Neurologic: Sensation intact. Strength 5/5 in all 4.  Psychiatric: Normal judgment and insight. Alert and oriented x 3.  Normal mood.   EKG: independently reviewed, showing sinus rhythm with rate of 60, QTc 430  X-rays on Admission: I personally reviewed and I agree with radiologist reading as below.  CT Cervical Spine Wo Contrast  Result Date: 06/22/2022 CLINICAL DATA:  Trauma, fall EXAM: CT CERVICAL SPINE WITHOUT CONTRAST TECHNIQUE: Multidetector CT imaging of the cervical spine was performed without intravenous contrast. Multiplanar CT image reconstructions were also generated. RADIATION DOSE  REDUCTION: This exam was performed according to the departmental dose-optimization program which includes automated exposure control, adjustment of the mA and/or kV according to patient size and/or use of iterative reconstruction technique. COMPARISON:  CT angiogram of neck done on 09/09/2017 FINDINGS: Alignment: There is old ununited fracture in the base of odontoid process of C2 vertebra. Posterior margin of odontoid process is 12 mm posterior to the body of C2 vertebra. This finding has not changed. Skull base and vertebrae: No recent fracture is seen. Soft tissues and spinal canal: There is extrinsic pressure over the ventral margin of thecal sac caused by bony spurs, more so on the left side at C6-C7 level. Disc levels: There is encroachment of neural foramina by bony spurs from C3 to C7 levels. Upper chest: No acute findings are seen. Other: None. IMPRESSION: There is old displaced ununited fracture in base of odontoid process with no significant interval change since 09/09/2017. No recent fracture is seen in cervical spine.  Cervical spondylosis. Electronically Signed   By: Elmer Picker M.D.   On: 06/22/2022 15:13   DG Ankle 2 Views Left  Result Date: 06/22/2022 CLINICAL DATA:  Status post reduction. EXAM: LEFT ANKLE - 2 VIEW COMPARISON:  Same day. FINDINGS: The left ankle has been casted and immobilized. There is mildly improved alignment of the distal tibial and fibular fractures, although significant displacement remains. IMPRESSION: Status post casting and immobilization of moderately displaced distal left tibial and fibular fractures. Electronically Signed   By: Marijo Conception M.D.   On: 06/22/2022 15:10   CT Head Wo Contrast  Result Date: 06/22/2022 CLINICAL DATA:  Trauma, fall EXAM: CT HEAD WITHOUT CONTRAST TECHNIQUE: Contiguous axial images were obtained from the base of the skull through the vertex without intravenous contrast. RADIATION DOSE REDUCTION: This exam was performed according  to the departmental dose-optimization program which includes automated exposure control, adjustment of the mA and/or kV according to patient size and/or use of iterative reconstruction technique. COMPARISON:  09/08/2017 FINDINGS: Brain: No acute intracranial findings are seen. There are no signs of bleeding within the cranium. Cortical sulci are prominent. There is decreased density in periventricular and subcortical white matter. Vascular: Unremarkable. Skull: No fracture is seen in calvarium. Sinuses/Orbits: There are no air-fluid levels. There is mild mucosal thickening in ethmoid sinus. Other: None. IMPRESSION: No acute intracranial findings are seen in noncontrast CT brain. Atrophy. Small-vessel disease. Electronically Signed   By: Elmer Picker M.D.   On: 06/22/2022 15:05   DG Ankle Complete Left  Result Date: 06/22/2022 CLINICAL DATA:  Left ankle pain after falling. EXAM: LEFT ANKLE COMPLETE - 3+ VIEW COMPARISON:  None Available. FINDINGS: There is a mildly comminuted transverse fracture through the base of the medial malleolus which is moderately displaced laterally. Oblique fracture of the distal fibular metadiaphysis is also moderately displaced laterally and posteriorly. There is a probable minimally displaced fracture of the posterior malleolus, best seen on the lateral view. The talus is laterally subluxed relative to the tibial plafond. No frank dislocation or tarsal bone fracture identified. Moderate  surrounding soft tissue swelling. IMPRESSION: Trimalleolar fracture subluxation of the left ankle as described. Electronically Signed   By: Richardean Sale M.D.   On: 06/22/2022 13:12    Labs on Admission: I have personally reviewed following labs  CBC: Recent Labs  Lab 06/22/22 1345  WBC 10.0  NEUTROABS 6.2  HGB 12.1*  HCT 37.0*  MCV 99.7  PLT 99991111   Basic Metabolic Panel: Recent Labs  Lab 06/22/22 1613  NA 138  K 4.2  CL 103  CO2 27  GLUCOSE 96  BUN 25*  CREATININE 1.06   CALCIUM 8.7*   GFR: CrCl cannot be calculated (Unknown ideal weight.).  Liver Function Tests: Recent Labs  Lab 06/22/22 1613  AST 20  ALT 16  ALKPHOS 61  BILITOT 0.5  PROT 6.9  ALBUMIN 3.8   Urine analysis:    Component Value Date/Time   COLORURINE YELLOW 09/16/2021 1306   APPEARANCEUR CLEAR 09/16/2021 1306   APPEARANCEUR Clear 10/06/2016 1534   LABSPEC 1.020 09/16/2021 1306   LABSPEC >=1.030 07/18/2013 0931   PHURINE 7.0 09/16/2021 1306   GLUCOSEU NEGATIVE 09/16/2021 1306   GLUCOSEU NEGATIVE 07/18/2013 0931   HGBUR NEGATIVE 09/16/2021 1306   BILIRUBINUR NEGATIVE 09/16/2021 1306   BILIRUBINUR Negative 10/06/2016 1534   BILIRUBINUR 1+ 07/18/2013 0931   KETONESUR NEGATIVE 09/16/2021 1306   PROTEINUR NEGATIVE 09/16/2021 1306   NITRITE NEGATIVE 09/16/2021 1306   LEUKOCYTESUR NEGATIVE 09/16/2021 [place] 07/18/2013 0931   This document was prepared using Dragon Voice Recognition software and may include unintentional dictation errors.  Dr. Tobie Poet [facility]  If 7PM-7AM, please contact overnight-coverage provider If 7AM-7PM, please contact day coverage provider [email or web address]  06/22/2022, 7:50 PM

## 2022-06-22 NOTE — ED Notes (Signed)
See triage note. Patient came to ED via [facility]. Pt tripped coming in his door and fell. Pt hit his head but had no loss of consciousness. Hurt his L foot which is swollen along with ankle pt is currently able to move toes on L side. Denies any pain laying down but when trying to stand on feet pain is a 10.

## 2022-06-22 NOTE — ED Notes (Signed)
This RN, NT Jinny Blossom and MD Jessup applied dressings and splint on this patient on L foot up to the knee.

## 2022-06-22 NOTE — Assessment & Plan Note (Signed)
-   Aspirin 81 mg resumed for 06/24/2022, atorvastatin 40 mg nightly

## 2022-06-23 ENCOUNTER — Other Ambulatory Visit: Payer: Self-pay

## 2022-06-23 ENCOUNTER — Inpatient Hospital Stay: Payer: PPO

## 2022-06-23 ENCOUNTER — Inpatient Hospital Stay: Payer: PPO | Admitting: Anesthesiology

## 2022-06-23 ENCOUNTER — Encounter
Admission: EM | Disposition: A | Discharge status: Skilled Nursing Facility | Payer: Self-pay | Source: Home / Self Care | Attending: Internal Medicine

## 2022-06-23 DIAGNOSIS — W19XXXA Unspecified fall, initial encounter: Secondary | ICD-10-CM

## 2022-06-23 HISTORY — PX: ORIF ANKLE FRACTURE: SHX5408

## 2022-06-23 LAB — BASIC METABOLIC PANEL
Anion gap: 6 (ref 5–15)
BUN: 24 mg/dL — ABNORMAL HIGH (ref 8–23)
CO2: 27 mmol/L (ref 22–32)
Calcium: 8.2 mg/dL — ABNORMAL LOW (ref 8.9–10.3)
Chloride: 103 mmol/L (ref 98–111)
Creatinine, Ser: 1.14 mg/dL (ref 0.61–1.24)
GFR, Estimated: >60 mL/min (ref >60)
Glucose, Bld: 96 mg/dL (ref 70–99)
Potassium: 4.1 mmol/L (ref 3.5–5.1)
Sodium: 136 mmol/L (ref 135–145)

## 2022-06-23 LAB — CBC
HCT: 34.3 % — ABNORMAL LOW (ref 39.0–52.0)
Hemoglobin: 11.5 g/dL — ABNORMAL LOW (ref 13.0–17.0)
MCH: 32.6 pg (ref 26.0–34.0)
MCHC: 33.5 g/dL (ref 30.0–36.0)
MCV: 97.2 fL (ref 80.0–100.0)
Platelets: 142 10*3/uL — ABNORMAL LOW (ref 150–400)
RBC: 3.53 MIL/uL — ABNORMAL LOW (ref 4.22–5.81)
RDW: 13.1 % (ref 11.5–15.5)
WBC: 7.9 10*3/uL (ref 4.0–10.5)
nRBC: 0 % (ref 0.0–0.2)

## 2022-06-23 SURGERY — OPEN REDUCTION INTERNAL FIXATION (ORIF) ANKLE FRACTURE
Anesthesia: General | Site: Ankle | Laterality: Left

## 2022-06-23 MED ORDER — ACETAMINOPHEN 10 MG/ML IV SOLN
INTRAVENOUS | Status: DC | PRN
  Administered 2022-06-23: 1000 mg via INTRAVENOUS

## 2022-06-23 MED ORDER — FENTANYL CITRATE (PF) 100 MCG/2ML IJ SOLN
INTRAMUSCULAR | Status: AC
  Filled 2022-06-23: qty 2

## 2022-06-23 MED ORDER — BUPIVACAINE HCL 0.25 % IJ SOLN
INTRAMUSCULAR | Status: DC | PRN
  Administered 2022-06-23: 30 mL

## 2022-06-23 MED ORDER — LACTATED RINGERS IV SOLN: INTRAVENOUS | Status: DC | PRN

## 2022-06-23 MED ORDER — MIDAZOLAM HCL 2 MG/2ML IJ SOLN
INTRAMUSCULAR | Status: AC
  Filled 2022-06-23: qty 2

## 2022-06-23 MED ORDER — PHENYLEPHRINE HCL (PRESSORS) 10 MG/ML IV SOLN
INTRAVENOUS | Status: AC
  Filled 2022-06-23: qty 1

## 2022-06-23 MED ORDER — 0.9 % SODIUM CHLORIDE (POUR BTL) OPTIME
TOPICAL | Status: DC | PRN
  Administered 2022-06-23: 1000 mL

## 2022-06-23 MED ORDER — MIDAZOLAM HCL 2 MG/2ML IJ SOLN
INTRAMUSCULAR | Status: DC | PRN
  Administered 2022-06-23: 1 mg via INTRAVENOUS

## 2022-06-23 MED ORDER — PHENYLEPHRINE 80 MCG/ML (10ML) SYRINGE FOR IV PUSH (FOR BLOOD PRESSURE SUPPORT)
PREFILLED_SYRINGE | INTRAVENOUS | Status: DC | PRN
  Administered 2022-06-23 (×3): 160 ug via INTRAVENOUS

## 2022-06-23 MED ORDER — ACETAMINOPHEN 10 MG/ML IV SOLN
INTRAVENOUS | Status: AC
  Filled 2022-06-23: qty 100

## 2022-06-23 MED ORDER — PROPOFOL 10 MG/ML IV BOLUS
INTRAVENOUS | Status: DC | PRN
  Administered 2022-06-23: 140 mg via INTRAVENOUS

## 2022-06-23 MED ORDER — LIDOCAINE HCL (CARDIAC) PF 100 MG/5ML IV SOSY
PREFILLED_SYRINGE | INTRAVENOUS | Status: DC | PRN
  Administered 2022-06-23: 80 mg via INTRAVENOUS

## 2022-06-23 MED ORDER — CHLORHEXIDINE GLUCONATE 0.12 % MT SOLN
OROMUCOSAL | Status: AC
  Filled 2022-06-23: qty 15

## 2022-06-23 MED ORDER — PHENYLEPHRINE HCL-NACL 20-0.9 MG/250ML-% IV SOLN
INTRAVENOUS | Status: DC | PRN
  Administered 2022-06-23: 100 ug/min via INTRAVENOUS

## 2022-06-23 MED ORDER — CHLORHEXIDINE GLUCONATE 0.12 % MT SOLN
15.0000 mL | Freq: Once | OROMUCOSAL | Status: AC
  Administered 2022-06-23: 15 mL via OROMUCOSAL

## 2022-06-23 MED ORDER — DEXAMETHASONE SODIUM PHOSPHATE 10 MG/ML IJ SOLN
INTRAMUSCULAR | Status: DC | PRN
  Administered 2022-06-23: 10 mg via INTRAVENOUS

## 2022-06-23 MED ORDER — ONDANSETRON HCL 4 MG/2ML IJ SOLN
INTRAMUSCULAR | Status: DC | PRN
  Administered 2022-06-23: 4 mg via INTRAVENOUS

## 2022-06-23 MED ORDER — PROPOFOL 10 MG/ML IV BOLUS
INTRAVENOUS | Status: AC
  Filled 2022-06-23: qty 40

## 2022-06-23 MED ORDER — PHENYLEPHRINE HCL-NACL 20-0.9 MG/250ML-% IV SOLN
INTRAVENOUS | Status: AC
  Filled 2022-06-23: qty 250

## 2022-06-23 SURGICAL SUPPLY — 56 items
BIT DRILL 2.5X110 QC LCP DISP (BIT) IMPLANT
BIT DRILL CANN 2.7X625 NONSTRL (BIT) IMPLANT
BLADE SURG 15 STRL LF DISP TIS (BLADE) ×1 IMPLANT
BLADE SURG 15 STRL SS (BLADE) ×1
BNDG COHESIVE 4X5 TAN STRL LF (GAUZE/BANDAGES/DRESSINGS) ×1 IMPLANT
BNDG COHESIVE 6X5 TAN ST LF (GAUZE/BANDAGES/DRESSINGS) IMPLANT
BNDG ELASTIC 4X5.8 VLCR NS LF (GAUZE/BANDAGES/DRESSINGS) ×2 IMPLANT
BNDG ESMARCH 6 X 12 STRL LF (GAUZE/BANDAGES/DRESSINGS) ×1
BNDG ESMARCH 6X12 STRL LF (GAUZE/BANDAGES/DRESSINGS) ×1 IMPLANT
CUFF TOURN SGL QUICK 24 (TOURNIQUET CUFF)
CUFF TOURN SGL QUICK 34 (TOURNIQUET CUFF)
CUFF TRNQT CYL 24X4X16.5-23 (TOURNIQUET CUFF) IMPLANT
CUFF TRNQT CYL 34X4.125X (TOURNIQUET CUFF) IMPLANT
DRAPE FLUOR MINI C-ARM 54X84 (DRAPES) ×1 IMPLANT
DRAPE INCISE IOBAN 66X45 STRL (DRAPES) ×1 IMPLANT
DRAPE U-SHAPE 47X51 STRL (DRAPES) ×1 IMPLANT
DURAPREP 26ML APPLICATOR (WOUND CARE) ×2 IMPLANT
ELECT REM PT RETURN 9FT ADLT (ELECTROSURGICAL) ×1
ELECTRODE REM PT RTRN 9FT ADLT (ELECTROSURGICAL) ×1 IMPLANT
GAUZE SPONGE 4X4 12PLY STRL (GAUZE/BANDAGES/DRESSINGS) ×1 IMPLANT
GAUZE XEROFORM 1X8 LF (GAUZE/BANDAGES/DRESSINGS) ×1 IMPLANT
GLOVE BIOGEL PI IND STRL 9 (GLOVE) ×1 IMPLANT
GLOVE BIOGEL PI ORTHO SZ9 (GLOVE) ×4 IMPLANT
GOWN STRL REUS TWL 2XL XL LVL4 (GOWN DISPOSABLE) ×1 IMPLANT
GOWN STRL REUS W/ TWL LRG LVL3 (GOWN DISPOSABLE) ×1 IMPLANT
GOWN STRL REUS W/TWL LRG LVL3 (GOWN DISPOSABLE) ×1
GUIDEWARE NON THREAD 1.25X150 (WIRE) ×2
GUIDEWIRE NON THREAD 1.25X150 (WIRE) IMPLANT
KIT TURNOVER KIT A (KITS) ×1 IMPLANT
LABEL OR SOLS (LABEL) ×1 IMPLANT
MANIFOLD NEPTUNE II (INSTRUMENTS) ×1 IMPLANT
NS IRRIG 1000ML POUR BTL (IV SOLUTION) ×1 IMPLANT
PACK EXTREMITY ARMC (MISCELLANEOUS) ×1 IMPLANT
PAD ABD DERMACEA PRESS 5X9 (GAUZE/BANDAGES/DRESSINGS) ×2 IMPLANT
PAD CAST 4YDX4 CTTN HI CHSV (CAST SUPPLIES) ×3 IMPLANT
PADDING CAST COTTON 4X4 STRL (CAST SUPPLIES) ×3
PLATE LCP 3.5 1/3 TUB 7HX81 (Plate) IMPLANT
PLATE LCP 3.5 7H 98 (Plate) ×1 IMPLANT
SCREW CANC FT/18 4.0 (Screw) IMPLANT
SCREW CANN L THRD/48 4.0 (Screw) IMPLANT
SCREW LOCK CORT ST 3.5X12 (Screw) IMPLANT
SCREW LOCK CORT ST 3.5X14 (Screw) IMPLANT
SCREW LOCK CORT ST 3.5X16 (Screw) IMPLANT
SCREW LOCK CORT ST 3.5X26 (Screw) IMPLANT
SPLINT CAST 1 STEP 4X30 (MISCELLANEOUS) ×2 IMPLANT
SPONGE T-LAP 18X18 ~~LOC~~+RFID (SPONGE) ×1 IMPLANT
STAPLER SKIN PROX 35W (STAPLE) ×1 IMPLANT
STOCKINETTE STRL 6IN 960660 (GAUZE/BANDAGES/DRESSINGS) ×1 IMPLANT
STRIP CLOSURE SKIN 1/2X4 (GAUZE/BANDAGES/DRESSINGS) ×2 IMPLANT
SUT VIC AB 2-0 SH 27 (SUTURE) ×2
SUT VIC AB 2-0 SH 27XBRD (SUTURE) ×2 IMPLANT
SYR 30ML LL (SYRINGE) ×1 IMPLANT
TAPE PAPER 2X10 WHT MICROPORE (GAUZE/BANDAGES/DRESSINGS) ×1 IMPLANT
TOWEL OR 17X26 4PK STRL BLUE (TOWEL DISPOSABLE) IMPLANT
TRAP FLUID SMOKE EVACUATOR (MISCELLANEOUS) ×1 IMPLANT
WATER STERILE IRR 500ML POUR (IV SOLUTION) ×1 IMPLANT

## 2022-06-23 NOTE — Progress Notes (Signed)
Subjective:  POST OP CHECK s/p ORIF left trimalleolar ankle fracture.   Patient reports no pain in the left ankle.  His wife is in the room.  Objective:   VITALS:   Vitals:   06/23/22 1445 06/23/22 1500 06/23/22 1527 06/23/22 1713  BP: 129/73 130/80 (!) 157/70 (!) 146/83  Pulse: 70 69 69 66  Resp: '14 14 16 18  '$ Temp:   97.8 F (36.6 C) 97.7 F (36.5 C)  TempSrc:   Oral   SpO2: 93% 93% 96% 96%  Weight:      Height:        PHYSICAL EXAM: Left lower extremity: Patient's AO splint is clean and dry.  His compartments are soft and compressible.  Distally he remains neurovascular intact and can flex and extend his toes and has intact sensation light touch in all 5 digits and the first dorsal webspace.   LABS  Results for orders placed or performed during the hospital encounter of 06/22/22 (from the past 24 hour(s))  Basic metabolic panel     Status: Abnormal   Collection Time: 06/23/22  4:55 AM  Result Value Ref Range   Sodium 136 135 - 145 mmol/L   Potassium 4.1 3.5 - 5.1 mmol/L   Chloride 103 98 - 111 mmol/L   CO2 27 22 - 32 mmol/L   Glucose, Bld 96 70 - 99 mg/dL   BUN 24 (H) 8 - 23 mg/dL   Creatinine, Ser 1.14 0.61 - 1.24 mg/dL   Calcium 8.2 (L) 8.9 - 10.3 mg/dL   GFR, Estimated >60 >60 mL/min   Anion gap 6 5 - 15  CBC     Status: Abnormal   Collection Time: 06/23/22  4:55 AM  Result Value Ref Range   WBC 7.9 4.0 - 10.5 K/uL   RBC 3.53 (L) 4.22 - 5.81 MIL/uL   Hemoglobin 11.5 (L) 13.0 - 17.0 g/dL   HCT 34.3 (L) 39.0 - 52.0 %   MCV 97.2 80.0 - 100.0 fL   MCH 32.6 26.0 - 34.0 pg   MCHC 33.5 30.0 - 36.0 g/dL   RDW 13.1 11.5 - 15.5 %   Platelets 142 (L) 150 - 400 K/uL   nRBC 0.0 0.0 - 0.2 %    DG Ankle Complete Left  Result Date: 06/23/2022 CLINICAL DATA:  Closed ankle fracture EXAM: LEFT ANKLE COMPLETE - 3+ VIEW COMPARISON:  Intraoperative radiographs earlier today FINDINGS: Interval surgical changes of ORIF of bimalleolar fracture. A lateral buttress plate and  screw construct with additional inter fragmentary screw transfix the fibular fracture site. The medial malleolar fracture site is transfix by 2 cannulated lag screws. The ankle mortise appears congruent. No evidence of immediate hardware complication. Fiberglass casting material is present. Surgical staples are present. IMPRESSION: Interval ORIF of bimalleolar fracture without evidence of immediate hardware complication. Electronically Signed   By: Jacqulynn Cadet M.D.   On: 06/23/2022 14:54   DG MINI C-ARM IMAGE ONLY  Result Date: 06/23/2022 There is no interpretation for this exam.  This order is for images obtained during a surgical procedure.  Please See "Surgeries" Tab for more information regarding the procedure.   CT Cervical Spine Wo Contrast  Result Date: 06/22/2022 CLINICAL DATA:  Trauma, fall EXAM: CT CERVICAL SPINE WITHOUT CONTRAST TECHNIQUE: Multidetector CT imaging of the cervical spine was performed without intravenous contrast. Multiplanar CT image reconstructions were also generated. RADIATION DOSE REDUCTION: This exam was performed according to the departmental dose-optimization program which includes automated exposure control, adjustment  of the mA and/or kV according to patient size and/or use of iterative reconstruction technique. COMPARISON:  CT angiogram of neck done on 09/09/2017 FINDINGS: Alignment: There is old ununited fracture in the base of odontoid process of C2 vertebra. Posterior margin of odontoid process is 12 mm posterior to the body of C2 vertebra. This finding has not changed. Skull base and vertebrae: No recent fracture is seen. Soft tissues and spinal canal: There is extrinsic pressure over the ventral margin of thecal sac caused by bony spurs, more so on the left side at C6-C7 level. Disc levels: There is encroachment of neural foramina by bony spurs from C3 to C7 levels. Upper chest: No acute findings are seen. Other: None. IMPRESSION: There is old displaced ununited  fracture in base of odontoid process with no significant interval change since 09/09/2017. No recent fracture is seen in cervical spine.  Cervical spondylosis. Electronically Signed   By: Elmer Picker M.D.   On: 06/22/2022 15:13   DG Ankle 2 Views Left  Result Date: 06/22/2022 CLINICAL DATA:  Status post reduction. EXAM: LEFT ANKLE - 2 VIEW COMPARISON:  Same day. FINDINGS: The left ankle has been casted and immobilized. There is mildly improved alignment of the distal tibial and fibular fractures, although significant displacement remains. IMPRESSION: Status post casting and immobilization of moderately displaced distal left tibial and fibular fractures. Electronically Signed   By: Marijo Conception M.D.   On: 06/22/2022 15:10   CT Head Wo Contrast  Result Date: 06/22/2022 CLINICAL DATA:  Trauma, fall EXAM: CT HEAD WITHOUT CONTRAST TECHNIQUE: Contiguous axial images were obtained from the base of the skull through the vertex without intravenous contrast. RADIATION DOSE REDUCTION: This exam was performed according to the departmental dose-optimization program which includes automated exposure control, adjustment of the mA and/or kV according to patient size and/or use of iterative reconstruction technique. COMPARISON:  09/08/2017 FINDINGS: Brain: No acute intracranial findings are seen. There are no signs of bleeding within the cranium. Cortical sulci are prominent. There is decreased density in periventricular and subcortical white matter. Vascular: Unremarkable. Skull: No fracture is seen in calvarium. Sinuses/Orbits: There are no air-fluid levels. There is mild mucosal thickening in ethmoid sinus. Other: None. IMPRESSION: No acute intracranial findings are seen in noncontrast CT brain. Atrophy. Small-vessel disease. Electronically Signed   By: Elmer Picker M.D.   On: 06/22/2022 15:05   DG Ankle Complete Left  Result Date: 06/22/2022 CLINICAL DATA:  Left ankle pain after falling. EXAM: LEFT  ANKLE COMPLETE - 3+ VIEW COMPARISON:  None Available. FINDINGS: There is a mildly comminuted transverse fracture through the base of the medial malleolus which is moderately displaced laterally. Oblique fracture of the distal fibular metadiaphysis is also moderately displaced laterally and posteriorly. There is a probable minimally displaced fracture of the posterior malleolus, best seen on the lateral view. The talus is laterally subluxed relative to the tibial plafond. No frank dislocation or tarsal bone fracture identified. Moderate surrounding soft tissue swelling. IMPRESSION: Trimalleolar fracture subluxation of the left ankle as described. Electronically Signed   By: Richardean Sale M.D.   On: 06/22/2022 13:12    Assessment/Plan: Day of Surgery   Principal Problem:   Fracture of left tibia and fibula, closed, initial encounter Active Problems:   Benign fibroma of prostate   HLD (hyperlipidemia)   BPH with obstruction/lower urinary tract symptoms   Benign hypertension with CKD (chronic kidney disease) stage III (HCC)   History of cerebrovascular accident (CVA) with residual  deficit   Cognitive deficit as late effect of cerebrovascular accident (CVA)   Fall  Patient doing well postop.  He will begin physical therapy tomorrow and must remain nonweightbearing on the left lower extremity for a total of 6 to 8 weeks postop.  Patient will continue to elevate the left lower extremity.  Ice may be applied to the left ankle.  Patient will have labs in the a.m.    Thornton Park , MD 06/23/2022, 5:57 PM

## 2022-06-23 NOTE — Progress Notes (Signed)
The patient has been re-examined, and the chart reviewed, and there have been no interval changes to the documented history and physical.    The risks, benefits, and alternatives have been discussed at length, and the patient is willing to proceed.   

## 2022-06-23 NOTE — Progress Notes (Signed)
PROGRESS NOTE  [name]    DOB: [birth year], 88 y.o.  [id number]    Code Status: Full Code   DOA: 06/22/2022   LOS: 1   Brief hospital course  [name] is a 88 y.o. male with a PMH significant for hypertension, hyperlipidemia, BPH .  They presented from home to the ED on 06/22/2022 with fall from a stair. States that he was stepping up the stairs and the next thing he knew, it was like his leg was wet spaghetti noodles.  He lives independently with his wife and is her caregiver. He is a retired Scientist, research (life sciences).    In the ED, it was found that they had temperature of 97.6, respiration rate of 17, heart rate 62, blood pressure 140/84, SpO2 of 98% on room air. .  Significant findings included Na 138, potassium 4.2, chloride 103, bicarb 27, nonfasting blood glucose 96, BUN 25, serum creatinine 1.06, EGFR greater than 60, WBC 10, hemoglobin 12.1, platelets of 155. Head/neck CT was negative for acute abnormalities. L ankle xray revealed: moderately displaced distal tibia and fibula fractures.   They were initially treated with manual reduction and casting by EDP.  Ortho surgery was consulted.  Patient was admitted to medicine service for further workup and management of ankle fracture as outlined in detail below.  06/23/22 -stable  Assessment & Plan  Principal Problem:   Fracture of left tibia and fibula, closed, initial encounter Active Problems:   Benign fibroma of prostate   HLD (hyperlipidemia)   BPH with obstruction/lower urinary tract symptoms   Benign hypertension with CKD (chronic kidney disease) stage III (HCC)   History of cerebrovascular accident (CVA) with residual deficit   Cognitive deficit as late effect of cerebrovascular accident (CVA)  Fracture of left tibia and fibula, closed, initial encounter Closed distal fracture of left tibia and fibula. Patient states he has zero pain while at rest pre-op.  - Orthopedic service following - OR for fixation 3/5 - analgesia  PRN - PT/OT evaluation Post-op   History of cerebrovascular accident (CVA) with residual deficit - Aspirin 81 mg resumed for 06/24/2022, atorvastatin 40 mg nightly   Benign hypertension with CKD (chronic kidney disease) stage III (HCC) - Lisinopril 20 mg daily resumed   BPH with obstruction/lower urinary tract symptoms - Resumed home finasteride 5 mg daily   HLD (hyperlipidemia) - Atorvastatin 40 mg nightly resumed  Body mass index is 32.28 kg/m.  VTE ppx: Place TED hose Start: 06/22/22 1530  Diet:     Diet   Diet NPO time specified   Consultants: Ortho surgery   Subjective 06/23/22    Pt reports having no pain. He has not weight-beared on his leg since the fall. Denies CO, SOB, headache.  Denies complaints or concerns at this time.    Objective   Vitals:   06/22/22 1557 06/22/22 2021 06/23/22 0901 06/23/22 1029  BP: (!) 165/85 (!) 145/65 (!) 134/110 (!) 149/39  Pulse: 75 60 72 72  Resp: '17  18 18  '$ Temp: 97.8 F (36.6 C) 98.1 F (36.7 C) 98 F (36.7 C) 98.1 F (36.7 C)  TempSrc: Oral   Oral  SpO2: 98% 94% 95% 95%  Weight:    102.1 kg  Height:    '5\' 10"'$  (1.778 m)   No intake or output data in the 24 hours ending 06/23/22 1223 Filed Weights   06/23/22 1029  Weight: 102.1 kg    Physical Exam:  General: awake, alert, NAD HEENT:  atraumatic, clear conjunctiva, anicteric sclera, MMM, hearing grossly normal Respiratory: normal respiratory effort. CTAB Cardiovascular: quick capillary refill Nervous: A&O x3. no gross focal neurologic deficits, normal speech Extremities: Left ankle in splint. Distal toes exposed with good cap refill, sensation and motor function.  Skin: dry, intact, normal temperature, normal color. No rashes, lesions or ulcers on exposed skin Psychiatry: normal mood, congruent affect  Labs   I have personally reviewed the following labs and imaging studies CBC    Component Value Date/Time   WBC 7.9 06/23/2022 0455   RBC 3.53 (L) 06/23/2022  0455   HGB 11.5 (L) 06/23/2022 0455   HGB 13.4 11/18/2018 1054   HCT 34.3 (L) 06/23/2022 0455   HCT 39.8 11/18/2018 1054   PLT 142 (L) 06/23/2022 0455   PLT 159 11/18/2018 1054   MCV 97.2 06/23/2022 0455   MCV 98 (H) 11/18/2018 1054   MCV 94 07/18/2013 0932   MCH 32.6 06/23/2022 0455   MCHC 33.5 06/23/2022 0455   RDW 13.1 06/23/2022 0455   RDW 13.0 11/18/2018 1054   RDW 14.1 07/18/2013 0932   LYMPHSABS 2.3 06/22/2022 1345   LYMPHSABS 2.8 07/18/2013 0932   MONOABS 0.8 06/22/2022 1345   MONOABS 0.9 07/18/2013 0932   EOSABS 0.6 (H) 06/22/2022 1345   EOSABS 0.6 07/18/2013 0932   BASOSABS 0.1 06/22/2022 1345   BASOSABS 0.1 07/18/2013 0932      Latest Ref Rng & Units 06/23/2022    4:55 AM 06/22/2022    4:13 PM 08/11/2021   11:26 AM  BMP  Glucose 70 - 99 mg/dL 96  96  116   BUN 8 - 23 mg/dL '24  25  29   '$ Creatinine 0.61 - 1.24 mg/dL 1.14  1.06  1.25   BUN/Creat Ratio 6 - 22 (calc)   23   Sodium 135 - 145 mmol/L 136  138  141   Potassium 3.5 - 5.1 mmol/L 4.1  4.2  4.5   Chloride 98 - 111 mmol/L 103  103  105   CO2 22 - 32 mmol/L 27  27  33   Calcium 8.9 - 10.3 mg/dL 8.2  8.7  8.7     DG MINI C-ARM IMAGE ONLY  Result Date: 06/23/2022 There is no interpretation for this exam.  This order is for images obtained during a surgical procedure.  Please See "Surgeries" Tab for more information regarding the procedure.   CT Cervical Spine Wo Contrast  Result Date: 06/22/2022 CLINICAL DATA:  Trauma, fall EXAM: CT CERVICAL SPINE WITHOUT CONTRAST TECHNIQUE: Multidetector CT imaging of the cervical spine was performed without intravenous contrast. Multiplanar CT image reconstructions were also generated. RADIATION DOSE REDUCTION: This exam was performed according to the departmental dose-optimization program which includes automated exposure control, adjustment of the mA and/or kV according to patient size and/or use of iterative reconstruction technique. COMPARISON:  CT angiogram of neck done on  09/09/2017 FINDINGS: Alignment: There is old ununited fracture in the base of odontoid process of C2 vertebra. Posterior margin of odontoid process is 12 mm posterior to the body of C2 vertebra. This finding has not changed. Skull base and vertebrae: No recent fracture is seen. Soft tissues and spinal canal: There is extrinsic pressure over the ventral margin of thecal sac caused by bony spurs, more so on the left side at C6-C7 level. Disc levels: There is encroachment of neural foramina by bony spurs from C3 to C7 levels. Upper chest: No acute findings are seen. Other: None. IMPRESSION:  There is old displaced ununited fracture in base of odontoid process with no significant interval change since 09/09/2017. No recent fracture is seen in cervical spine.  Cervical spondylosis. Electronically Signed   By: Elmer Picker M.D.   On: 06/22/2022 15:13   DG Ankle 2 Views Left  Result Date: 06/22/2022 CLINICAL DATA:  Status post reduction. EXAM: LEFT ANKLE - 2 VIEW COMPARISON:  Same day. FINDINGS: The left ankle has been casted and immobilized. There is mildly improved alignment of the distal tibial and fibular fractures, although significant displacement remains. IMPRESSION: Status post casting and immobilization of moderately displaced distal left tibial and fibular fractures. Electronically Signed   By: Marijo Conception M.D.   On: 06/22/2022 15:10   CT Head Wo Contrast  Result Date: 06/22/2022 CLINICAL DATA:  Trauma, fall EXAM: CT HEAD WITHOUT CONTRAST TECHNIQUE: Contiguous axial images were obtained from the base of the skull through the vertex without intravenous contrast. RADIATION DOSE REDUCTION: This exam was performed according to the departmental dose-optimization program which includes automated exposure control, adjustment of the mA and/or kV according to patient size and/or use of iterative reconstruction technique. COMPARISON:  09/08/2017 FINDINGS: Brain: No acute intracranial findings are seen.  There are no signs of bleeding within the cranium. Cortical sulci are prominent. There is decreased density in periventricular and subcortical white matter. Vascular: Unremarkable. Skull: No fracture is seen in calvarium. Sinuses/Orbits: There are no air-fluid levels. There is mild mucosal thickening in ethmoid sinus. Other: None. IMPRESSION: No acute intracranial findings are seen in noncontrast CT brain. Atrophy. Small-vessel disease. Electronically Signed   By: Elmer Picker M.D.   On: 06/22/2022 15:05   DG Ankle Complete Left  Result Date: 06/22/2022 CLINICAL DATA:  Left ankle pain after falling. EXAM: LEFT ANKLE COMPLETE - 3+ VIEW COMPARISON:  None Available. FINDINGS: There is a mildly comminuted transverse fracture through the base of the medial malleolus which is moderately displaced laterally. Oblique fracture of the distal fibular metadiaphysis is also moderately displaced laterally and posteriorly. There is a probable minimally displaced fracture of the posterior malleolus, best seen on the lateral view. The talus is laterally subluxed relative to the tibial plafond. No frank dislocation or tarsal bone fracture identified. Moderate surrounding soft tissue swelling. IMPRESSION: Trimalleolar fracture subluxation of the left ankle as described. Electronically Signed   By: Richardean Sale M.D.   On: 06/22/2022 13:12    Disposition Plan & Communication  Patient status: Inpatient  Admitted From: Home Planned disposition location: Home, Home health Anticipated discharge date: 3/6 pending post-op recovery and PT recs  Family Communication: none at bedside    Author: Richarda Osmond, DO [facility] 06/23/2022, 12:23 PM   Available by Epic secure chat 7AM-7PM. If 7PM-7AM, please contact night-coverage.  [facility] contact information found on [email or web address].

## 2022-06-23 NOTE — Anesthesia Postprocedure Evaluation (Signed)
Anesthesia Post Note  Patient: TAHSIN CARSE  Procedure(s) Performed: OPEN REDUCTION INTERNAL FIXATION (ORIF) ANKLE FRACTURE (Left: Ankle)  Patient location during evaluation: PACU Anesthesia Type: General Level of consciousness: awake and alert Pain management: pain level controlled Vital Signs Assessment: post-procedure vital signs reviewed and stable Respiratory status: spontaneous breathing, nonlabored ventilation, respiratory function stable and patient connected to nasal cannula oxygen Cardiovascular status: blood pressure returned to baseline and stable Postop Assessment: no apparent nausea or vomiting Anesthetic complications: no   No notable events documented.   Last Vitals:  Vitals:   06/23/22 1500 06/23/22 1527  BP: 130/80 (!) 157/70  Pulse: 69 69  Resp: 14 16  Temp:  36.6 C  SpO2: 93% 96%    Last Pain:  Vitals:   06/23/22 1527  TempSrc: Oral  PainSc: 0-No pain                 Precious Haws Shaunie Boehm

## 2022-06-23 NOTE — Anesthesia Procedure Notes (Signed)
Procedure Name: LMA Insertion Date/Time: 06/23/2022 12:17 PM  Performed by: Lorie Apley, CRNAPre-anesthesia Checklist: Patient identified, Patient being monitored, Timeout performed, Emergency Drugs available and Suction available Patient Re-evaluated:Patient Re-evaluated prior to induction Oxygen Delivery Method: Circle system utilized Preoxygenation: Pre-oxygenation with 100% oxygen Induction Type: IV induction Ventilation: Mask ventilation without difficulty LMA: LMA inserted LMA Size: 5.0 Tube type: Oral Number of attempts: 1 Placement Confirmation: positive ETCO2 and breath sounds checked- equal and bilateral Tube secured with: Tape Dental Injury: Teeth and Oropharynx as per pre-operative assessment

## 2022-06-23 NOTE — Transfer of Care (Signed)
Immediate Anesthesia Transfer of Care Note  Patient: [name]  Procedure(s) Performed: OPEN REDUCTION INTERNAL FIXATION (ORIF) ANKLE FRACTURE (Left: Ankle)  Patient Location: PACU  Anesthesia Type:General  Level of Consciousness: awake  Airway & Oxygen Therapy: Patient Spontanous Breathing and Patient connected to face mask oxygen  Post-op Assessment: Report given to RN and Post -op Vital signs reviewed and stable  Post vital signs: Reviewed  Last Vitals:  Vitals Value Taken Time  BP 152/69 06/23/22 1426  Temp 85F   Pulse 74 06/23/22 1430  Resp 12 06/23/22 1430  SpO2 100 % 06/23/22 1430  Vitals shown include unvalidated device data.  Last Pain:  Vitals:   06/23/22 1029  TempSrc: Oral  PainSc: 0-No pain         Complications: No notable events documented.

## 2022-06-23 NOTE — Op Note (Signed)
06/23/2022  2:51 PM  PATIENT:  [name]    PRE-OPERATIVE DIAGNOSIS:  Left closed trimalleolar ankle fracture  POST-OPERATIVE DIAGNOSIS:  Same  PROCEDURE:  OPEN REDUCTION INTERNAL FIXATION (ORIF) LEFT BIMALLEOLAR ANKLE FRACTURE  SURGEON:  Thornton Park, MD  ANESTHESIA:   General  EBL:  10cc  TOURNIQUET TIME:  89 minutes  PREOPERATIVE INDICATIONS:  [name] is a  88 y.o. male with a diagnosis of an unstable closed trimalleolar ankle fracture.  Given the unstable nature of this fracture he was recommended for open reduction internal fixation.  I reviewed the details of the operation as well as the postoperative course with the patient.  I discussed the risks and benefits of surgery. The risks include but are not limited to infection, bleeding requiring blood transfusion, nerve or blood vessel injury, joint stiffness or loss of motion, persistent pain, weakness or instability, malunion, nonunion and hardware failure and the need for further surgery.  Patient understood these risks and wished to proceed.   OPERATIVE IMPLANTS: Synthes 7 hole one third tubular plate for lateral fixation and 4.0 mm cannulated screws x 2 for medial malleolar fixation.  OPERATIVE FINDINGS: Trimalleolar ankle fracture with lateral talar subluxation  OPERATIVE PROCEDURE:   Patient was met in the preoperative area. The left leg was signed my initials and the word yes according the hospital's correct site of surgery protocol.  The patient was brought to the operating room where he underwent general anesthesia. The patient was placed supine on the operative table. A bump was placed under the left hip. A tourniquet was applied to the left thigh.  The lower extremity was prepped and draped in a sterile fashion. A timeout was performed to verify the patient's name, date of birth, medical record number, correct site of surgery and correct procedure to be performed. It was also used to verify the patient received  antibiotics, and that all appropriate instruments, implants and radiographic studies were available in the room. Once all in attendance were in agreement, the case began.  The left lower extremity was exsanguinated with an Esmarch. The tourniquet was inflated to 275 mmHg. This was applied for a total of 89 minutes. A lateral incision was made over the fibula. The subcutaneous tissues were dissected with the Metzenbaum scissor and pickup. Care was taken to avoid injury to the superficial peroneal nerve. The lateral malleolus fracture was identified and irrigated and fracture hematoma was removed. Soft tissue was removed from the fracture site using a periosteal elevator. A fracture reduction clamp was then used to reduce the fracture to an anatomic position.     The lateral malleolus was then drilled in an AP direction, perpendicular to the fracture site to allow for placement of the lag screw.   A single lag screw, 26 mm in length, was advanced across the fracture site by hand. This compressed the fracture. A Synthes 7 hole, 1/3 tubular plate was then contoured and placed along the lateral fibula. Bicortical screws were placed proximal to the fracture and fully threaded cancellus screws were placed distal the fracture. The fracture reduction and hardware placement were confirmed on AP and lateral imaging.  Once the lateral malleolus was plated, the attention was turned to the medial ankle. A small vertical incision was made over the tip of the medial malleolus.  Soft tissue was dissected with some with the Metzenbaum scissor and pickup. The fracture of the medial malleolus was identified. This was reduced with a dental pick. 2 threaded  K wires for the 4.0 cannulated screws were then advanced through the tip of the medial malleolus across the fracture site and into the distal tibia. The position of the K wires was evaluated on AP and lateral FluoroScan images. The length of the wires were measured with a depth  gauge and were both determined to be 3m in length. The wires were then overdrilled with a cannulated drill for the 4.0 cannulated screws. The two long threaded 4.0 cannulated screws were then advanced into position by hand, compressing the medial malleolus fracture.    The posterior malleolus was then examined under fluoroscopy. It was felt to be less than 20% of the articular surface and was in a near anatomic position. The decision was made made not to place an AP screw given its stability and small size.  A stress test of the right ankle was then performed under fluoroscopy.  This test did not reveal any syndesmotic injury or opening of the medial clear space.  The medial and lateral incisions were then copiously irrigated. The subcutaneous tissue was closed with 2-0 Vicryl and the skin approximated staples. A dry sterile dressing was applied along with an AO splint. The patient's ankle was positioned in neutral. The pateint was then awoken from anesthesia, transferred to hospital bed and brought to the PACU in stable condition. I was scrubbed and present the entire case and all sharp and instrument counts were correct at conclusion the case. I spoke to the patient's family in the post-op consultation room to let them know the case was performed without complication and the patient was stable in recovery room.    KTimoteo Gaul MD

## 2022-06-23 NOTE — Anesthesia Preprocedure Evaluation (Addendum)
Anesthesia Evaluation  Patient identified by MRN, date of birth, ID band Patient awake    Reviewed: Allergy & Precautions, NPO status , Patient's Chart, lab work & pertinent test results  History of Anesthesia Complications Negative for: history of anesthetic complications  Airway Mallampati: III  TM Distance: >3 FB Neck ROM: full    Dental  (+) Edentulous Upper, Edentulous Lower, Dental Advidsory Given, Missing   Pulmonary neg pulmonary ROS, neg COPD, former smoker   Pulmonary exam normal        Cardiovascular hypertension, (-) Past MI negative cardio ROS Normal cardiovascular exam     Neuro/Psych CVA  negative psych ROS   GI/Hepatic negative GI ROS, Neg liver ROS,,,  Endo/Other  negative endocrine ROS    Renal/GU Renal disease     Musculoskeletal negative musculoskeletal ROS (+)    Abdominal  (+) + obese  Peds  Hematology negative hematology ROS (+)   Anesthesia Other Findings Past Medical History: No date: Bowel obstruction (HCC) No date: BPH (benign prostatic hypertrophy) No date: Gastric outlet obstruction No date: High cholesterol No date: Hyperchloremia No date: Hypertension No date: Pancreatitis  Past Surgical History: 12/03/2014: ESOPHAGOGASTRODUODENOSCOPY; N/A     Comment:  Procedure: ESOPHAGOGASTRODUODENOSCOPY (EGD);  Surgeon:               Hulen Luster, MD;  Location: [facility] ENDOSCOPY;  Service:               Endoscopy;  Laterality: N/A;  BMI    Body Mass Index: 32.28 kg/m      Reproductive/Obstetrics negative OB ROS                             Anesthesia Physical Anesthesia Plan  ASA: 3  Anesthesia Plan: General   Post-op Pain Management:    Induction: Intravenous  PONV Risk Score and Plan: 2 and Ondansetron and Dexamethasone  Airway Management Planned: Oral ETT  Additional Equipment:   Intra-op Plan:   Post-operative Plan: Extubation in OR  Informed  Consent: I have reviewed the patients History and Physical, chart, labs and discussed the procedure including the risks, benefits and alternatives for the proposed anesthesia with the patient or authorized representative who has indicated his/her understanding and acceptance.     Dental Advisory Given  Plan Discussed with: CRNA  Anesthesia Plan Comments: (Patient consented for risks of anesthesia including but not limited to:  - adverse reactions to medications - damage to eyes, teeth, lips or other oral mucosa - nerve damage due to positioning  - sore throat or hoarseness - Damage to heart, brain, nerves, lungs, other parts of body or loss of life  Patient voiced understanding.)        Anesthesia Quick Evaluation

## 2022-06-24 ENCOUNTER — Encounter: Payer: Self-pay | Admitting: Orthopedic Surgery

## 2022-06-24 LAB — CBC
HCT: 35.1 % — ABNORMAL LOW (ref 39.0–52.0)
Hemoglobin: 11.6 g/dL — ABNORMAL LOW (ref 13.0–17.0)
MCH: 32.5 pg (ref 26.0–34.0)
MCHC: 33 g/dL (ref 30.0–36.0)
MCV: 98.3 fL (ref 80.0–100.0)
Platelets: 154 10*3/uL (ref 150–400)
RBC: 3.57 MIL/uL — ABNORMAL LOW (ref 4.22–5.81)
RDW: 12.8 % (ref 11.5–15.5)
WBC: 12.8 10*3/uL — ABNORMAL HIGH (ref 4.0–10.5)
nRBC: 0 % (ref 0.0–0.2)

## 2022-06-24 LAB — BASIC METABOLIC PANEL
Anion gap: 6 (ref 5–15)
BUN: 28 mg/dL — ABNORMAL HIGH (ref 8–23)
CO2: 27 mmol/L (ref 22–32)
Calcium: 8.1 mg/dL — ABNORMAL LOW (ref 8.9–10.3)
Chloride: 103 mmol/L (ref 98–111)
Creatinine, Ser: 1.23 mg/dL (ref 0.61–1.24)
GFR, Estimated: 58 mL/min — ABNORMAL LOW (ref >60)
Glucose, Bld: 146 mg/dL — ABNORMAL HIGH (ref 70–99)
Potassium: 3.8 mmol/L (ref 3.5–5.1)
Sodium: 136 mmol/L (ref 135–145)

## 2022-06-24 MED ORDER — SODIUM CHLORIDE 0.9 % IV SOLN: INTRAVENOUS | Status: DC

## 2022-06-24 MED ORDER — POLYETHYLENE GLYCOL 3350 17 G PO PACK: 17.0000 g | PACK | Freq: Every day | ORAL | Status: DC | PRN

## 2022-06-24 MED ORDER — ONDANSETRON HCL 4 MG PO TABS: 4.0000 mg | ORAL_TABLET | Freq: Four times a day (QID) | ORAL | Status: DC | PRN

## 2022-06-24 MED ORDER — CEFAZOLIN SODIUM-DEXTROSE 2-4 GM/100ML-% IV SOLN
2.0000 g | Freq: Three times a day (TID) | INTRAVENOUS | Status: AC
  Administered 2022-06-24 – 2022-06-25 (×2): 2 g via INTRAVENOUS
  Filled 2022-06-24 (×2): qty 100

## 2022-06-24 MED ORDER — TRAMADOL HCL 50 MG PO TABS
50.0000 mg | ORAL_TABLET | Freq: Four times a day (QID) | ORAL | Status: DC
  Administered 2022-06-24 (×2): 50 mg via ORAL
  Filled 2022-06-24 (×2): qty 1

## 2022-06-24 MED ORDER — OXYCODONE HCL 5 MG PO TABS: 5.0000 mg | ORAL_TABLET | ORAL | Status: DC | PRN

## 2022-06-24 MED ORDER — SENNA 8.6 MG PO TABS
1.0000 | ORAL_TABLET | Freq: Two times a day (BID) | ORAL | Status: DC
  Administered 2022-06-24 – 2022-06-26 (×4): 8.6 mg via ORAL
  Filled 2022-06-24 (×4): qty 1

## 2022-06-24 MED ORDER — METHOCARBAMOL 500 MG PO TABS: 500.0000 mg | ORAL_TABLET | Freq: Four times a day (QID) | ORAL | Status: DC | PRN

## 2022-06-24 MED ORDER — MENTHOL 3 MG MT LOZG: 1.0000 | LOZENGE | OROMUCOSAL | Status: DC | PRN

## 2022-06-24 MED ORDER — BISACODYL 10 MG RE SUPP: 10.0000 mg | Freq: Every day | RECTAL | Status: DC | PRN

## 2022-06-24 MED ORDER — ASPIRIN 325 MG PO TBEC: 325.0000 mg | DELAYED_RELEASE_TABLET | Freq: Two times a day (BID) | ORAL | Status: DC

## 2022-06-24 MED ORDER — ONDANSETRON HCL 4 MG/2ML IJ SOLN: 4.0000 mg | Freq: Four times a day (QID) | INTRAMUSCULAR | Status: DC | PRN

## 2022-06-24 MED ORDER — CLOPIDOGREL BISULFATE 75 MG PO TABS
75.0000 mg | ORAL_TABLET | Freq: Every day | ORAL | Status: DC
  Administered 2022-06-25 – 2022-06-26 (×2): 75 mg via ORAL
  Filled 2022-06-24 (×2): qty 1

## 2022-06-24 MED ORDER — ASPIRIN 325 MG PO TBEC
325.0000 mg | DELAYED_RELEASE_TABLET | Freq: Every day | ORAL | Status: DC
  Administered 2022-06-25 – 2022-06-26 (×2): 325 mg via ORAL
  Filled 2022-06-24 (×2): qty 1

## 2022-06-24 MED ORDER — ALUM & MAG HYDROXIDE-SIMETH 200-200-20 MG/5ML PO SUSP: 30.0000 mL | ORAL | Status: DC | PRN

## 2022-06-24 MED ORDER — ACETAMINOPHEN 500 MG PO TABS
1000.0000 mg | ORAL_TABLET | Freq: Four times a day (QID) | ORAL | Status: AC
  Administered 2022-06-24 (×2): 1000 mg via ORAL
  Filled 2022-06-24 (×2): qty 2

## 2022-06-24 MED ORDER — METHOCARBAMOL 1000 MG/10ML IJ SOLN: 500.0000 mg | Freq: Four times a day (QID) | INTRAVENOUS | Status: DC | PRN

## 2022-06-24 MED ORDER — ENOXAPARIN SODIUM 40 MG/0.4ML IJ SOSY
40.0000 mg | PREFILLED_SYRINGE | Freq: Every day | INTRAMUSCULAR | Status: DC
  Administered 2022-06-24: 40 mg via SUBCUTANEOUS
  Filled 2022-06-24: qty 0.4

## 2022-06-24 MED ORDER — PHENOL 1.4 % MT LIQD: 1.0000 | OROMUCOSAL | Status: DC | PRN

## 2022-06-24 MED ORDER — DOCUSATE SODIUM 100 MG PO CAPS
100.0000 mg | ORAL_CAPSULE | Freq: Two times a day (BID) | ORAL | Status: DC
  Administered 2022-06-24 – 2022-06-26 (×4): 100 mg via ORAL
  Filled 2022-06-24 (×4): qty 1

## 2022-06-24 NOTE — Progress Notes (Signed)
RN notified by operator that Pt had fallen. Pt was found on the floor after chair leaned forward. Pt reports no pain, denies hitting head, remains alert and oriented x4. Pt assisted by Pryor Curia and CNA back to the bed with no issues. Bed alarm set. MD and primary LPN notified.

## 2022-06-24 NOTE — Evaluation (Signed)
Occupational Therapy Evaluation Patient Details Name: [name] MRN: [id number] DOB: [birth year] Today's Date: 06/24/2022   History of Present Illness Pt is a 88 year old male with history of hypertension, hyperlipidemia, BPH, who presents emergency department after a fall.  Suffered L distal tib/fib fracture and is now s/p ORIF 3/5.   Clinical Impression   Patient seen for OT evaluation, spouse present. Pt presenting with decreased independence in self care, balance, functional mobility/transfers, and endurance. PTA pt lived with spouse, was independent for ADLs/IADLs, and independent for functional mobility without an AD. Pt currently functioning at [place] A for simulated toilet transfer and CGA-Min A to take 1 step forward/backward by hopping while using RW in order to maintain LLE NWB. Pt currently requires Max A for LB ADL access. Anticipate increased assistance required for standing ADL tasks. Pt will benefit from acute OT to increase overall independence in the areas of ADLs and functional mobility in order to safely discharge to next venue of care. OT recommends ongoing therapy upon discharge to maximize safety and independence with ADLs, decrease fall risk, decrease caregiver burden, and promote return to PLOF.     Recommendations for follow up therapy are one component of a multi-disciplinary discharge planning process, led by the attending physician.  Recommendations may be updated based on patient status, additional functional criteria and insurance authorization.   Follow Up Recommendations  Skilled nursing-short term rehab (<3 hours/day)     Assistance Recommended at Discharge Frequent or constant Supervision/Assistance  Patient can return home with the following A lot of help with bathing/dressing/bathroom;A lot of help with walking and/or transfers;Assistance with cooking/housework;Assist for transportation;Help with stairs or ramp for entrance    Functional Status Assessment   Patient has had a recent decline in their functional status and demonstrates the ability to make significant improvements in function in a reasonable and predictable amount of time.  Equipment Recommendations  Other (comment) (defer to next venue of care)    Recommendations for Other Services       Precautions / Restrictions Precautions Precautions: Fall Restrictions Weight Bearing Restrictions: Yes LLE Weight Bearing: Non weight bearing      Mobility Bed Mobility               General bed mobility comments: NT, pt received/left in recliner    Transfers Overall transfer level: Needs assistance Equipment used: Rolling walker (2 wheels) Transfers: Sit to/from Stand Sit to Stand: Min assist           General transfer comment: STS from recliner, VC for hand placement and safe technique, reminders to "kick out" LLE when standing/sitting to avoid bearing weight into it      Balance Overall balance assessment: Needs assistance Sitting-balance support: Bilateral upper extremity supported Sitting balance-Leahy Scale: Good     Standing balance support: Bilateral upper extremity supported, During functional activity, Reliant on assistive device for balance Standing balance-Leahy Scale: Fair                             ADL either performed or assessed with clinical judgement   ADL Overall ADL's : Needs assistance/impaired     Grooming: Set up;Sitting               Lower Body Dressing: Maximal assistance;Sitting/lateral leans   Toilet Transfer: Rolling walker (2 wheels);Minimal assistance Toilet Transfer Details (indicate cue type and reason): simulated Toileting- Clothing Manipulation and Hygiene: Maximal assistance;Sit to/from stand  Functional mobility during ADLs: Min guard;Minimal assistance;Rolling walker (2 wheels) (to take 1 step forward/backward by "hopping")       Vision Patient Visual Report: No change from baseline        Perception     Praxis      Pertinent Vitals/Pain Pain Assessment Pain Assessment: No/denies pain     Hand Dominance Left   Extremity/Trunk Assessment Upper Extremity Assessment Upper Extremity Assessment: Generalized weakness   Lower Extremity Assessment Lower Extremity Assessment: Generalized weakness       Communication Communication Communication: No difficulties   Cognition Arousal/Alertness: Awake/alert Behavior During Therapy: WFL for tasks assessed/performed Overall Cognitive Status: Within Functional Limits for tasks assessed                                       General Comments  Pt showed great effort and motivation, able to do some slow, effortful hopping but ultimately very functionally limited    Exercises Other Exercises Other Exercises: OT provided education re: role of OT, OT POC, post acute recs, sitting up for all meals, EOB/OOB mobility with assistance, home/fall safety, LLE NWB, LB dressing AE   Shoulder Instructions      Home Living Family/patient expects to be discharged to:: Private residence Living Arrangements: Spouse/significant other Available Help at Discharge: Available 24 hours/day;Family Type of Home: House Home Access: Stairs to enter [employer] of Steps: 1 Entrance Stairs-Rails: None Home Layout: One level     Bathroom Shower/Tub: Occupational psychologist: Standard     Home Equipment: Conservation officer, nature (2 wheels);Shower seat;Grab bars - tub/shower          Prior Functioning/Environment Prior Level of Function : Independent/Modified Independent;Driving;History of Falls (last six months)             Mobility Comments: pt reports no need for ADs, out of the home at least weekly for church and groceries ADLs Comments: Pt reports IND with ADLs/IADLs        OT Problem List: Decreased strength;Decreased range of motion;Decreased activity tolerance;Impaired balance (sitting and/or  standing);Decreased knowledge of use of DME or AE;Decreased knowledge of precautions      OT Treatment/Interventions: Self-care/ADL training;Therapeutic exercise;Neuromuscular education;Energy conservation;DME and/or AE instruction;Manual therapy;Modalities;Balance training;Patient/family education;Visual/perceptual remediation/compensation;Cognitive remediation/compensation;Therapeutic activities;Splinting    OT Goals(Current goals can be found in the care plan section) Acute Rehab OT Goals Patient Stated Goal: return to PLOF OT Goal Formulation: With patient/family Time For Goal Achievement: 07/08/22 Potential to Achieve Goals: Good   OT Frequency: Min 2X/week    Co-evaluation              AM-PAC OT "6 Clicks" Daily Activity     Outcome Measure Help from another person eating meals?: None Help from another person taking care of personal grooming?: A Little Help from another person toileting, which includes using toliet, bedpan, or urinal?: A Lot Help from another person bathing (including washing, rinsing, drying)?: A Lot Help from another person to put on and taking off regular upper body clothing?: None Help from another person to put on and taking off regular lower body clothing?: A Lot 6 Click Score: 17   End of Session Equipment Utilized During Treatment: Gait belt;Rolling walker (2 wheels) Nurse Communication: Mobility status;Precautions;Weight bearing status  Activity Tolerance: Patient tolerated treatment well;Patient limited by fatigue Patient left: in chair;with call bell/phone within reach;with chair alarm set;with family/visitor present  OT Visit Diagnosis: Unsteadiness on feet (R26.81);History of falling (Z91.81);Muscle weakness (generalized) (M62.81)                Time: 1435-1450 OT Time Calculation (min): 15 min Charges:  OT General Charges $OT Visit: 1 Visit OT Evaluation $OT Eval Low Complexity: 1 Low  [facility] MS, OTR/L ascom [phone]   06/24/22, 4:17 PM

## 2022-06-24 NOTE — Progress Notes (Signed)
PROGRESS NOTE  [name]    DOB: May 09, 1934, 88 y.o.  [id number]    Code Status: Full Code   DOA: 06/22/2022   LOS: 1   Brief hospital course  [name] is a 88 y.o. male with a PMH significant for hypertension, hyperlipidemia, BPH .  They presented from home to the ED on 06/22/2022 with fall from a stair. States that he was stepping up the stairs and the next thing he knew, it was like his leg was wet spaghetti noodles.  He lives independently with his wife and is her caregiver. He is a retired Scientist, research (life sciences).    In the ED, it was found that they had temperature of 97.6, respiration rate of 17, heart rate 62, blood pressure 140/84, SpO2 of 98% on room air. .  Significant findings included Na 138, potassium 4.2, chloride 103, bicarb 27, nonfasting blood glucose 96, BUN 25, serum creatinine 1.06, EGFR greater than 60, WBC 10, hemoglobin 12.1, platelets of 155. Head/neck CT was negative for acute abnormalities. L ankle xray revealed: moderately displaced distal tibia and fibula fractures.   They were initially treated with manual reduction and casting by EDP.  Ortho surgery was consulted.  Patient was admitted to medicine service for further workup and management of ankle fracture as outlined in detail below.  06/24/22 -stable.  PT/OT evaluations pending for discharge planning.  Assessment & Plan  Principal Problem:   Fracture of left tibia and fibula, closed, initial encounter Active Problems:   Benign fibroma of prostate   HLD (hyperlipidemia)   BPH with obstruction/lower urinary tract symptoms   Benign hypertension with CKD (chronic kidney disease) stage III (HCC)   History of cerebrovascular accident (CVA) with residual deficit   Cognitive deficit as late effect of cerebrovascular accident (CVA)  Fracture of left tibia and fibula, closed, initial encounter Closed distal fracture of left tibia and fibula. Patient states he has zero pain while at rest pre-op.  - Orthopedic  service following - Underwent ORIF on 3/5 - analgesia PRN - PT/OT evaluation Post-op   History of cerebrovascular accident (CVA) with residual deficit - Aspirin 81 mg resumed for 06/24/2022, atorvastatin 40 mg nightly   Benign hypertension with CKD (chronic kidney disease) stage III (HCC) - Lisinopril 20 mg daily resumed   BPH with obstruction/lower urinary tract symptoms - Resumed home finasteride 5 mg daily   HLD (hyperlipidemia) - Atorvastatin 40 mg nightly resumed  Body mass index is 32.28 kg/m.  VTE ppx: Place TED hose Start: 06/22/22 1530  Diet:     Diet   Diet NPO time specified   Consultants: Ortho surgery   Subjective 06/23/22    Pt visiting with his pastor at bedside this AM.  He denies any significant pain in his ankle.  Agreeable to rehab if it is recommended, has not worked with therapy yet today.  No other acute complaints.    Objective   Vitals:   06/22/22 1557 06/22/22 2021 06/23/22 0901 06/23/22 1029  BP: (!) 165/85 (!) 145/65 (!) 134/110 (!) 149/39  Pulse: 75 60 72 72  Resp: '17  18 18  '$ Temp: 97.8 F (36.6 C) 98.1 F (36.7 C) 98 F (36.7 C) 98.1 F (36.7 C)  TempSrc: Oral   Oral  SpO2: 98% 94% 95% 95%  Weight:    102.1 kg  Height:    '5\' 10"'$  (1.778 m)   No intake or output data in the 24 hours ending 06/23/22 [place]  06/23/22 1029  Weight: 102.1 kg    Physical Exam:   General exam: awake, alert, no acute distress HEENT: atraumatic, clear conjunctiva, anicteric sclera, moist mucus membranes, hearing grossly normal  Respiratory system: CTAB, no wheezes, rales or rhonchi, normal respiratory effort. Cardiovascular system: normal S1/S2, RRR no pedal edema.   Gastrointestinal system: soft, NT, ND, no HSM felt, +bowel sounds. Central nervous system: A&O x 4. no gross focal neurologic deficits, normal speech Extremities: left leg and foot in clean dry intact dressing and bandage, left foot distal sensation intact and normal  temp Skin: dry, intact, normal temperature Psychiatry: normal mood, congruent affect, judgement and insight appear normal   Labs   I have personally reviewed the following labs and imaging studies CBC    Component Value Date/Time   WBC 7.9 06/23/2022 0455   RBC 3.53 (L) 06/23/2022 0455   HGB 11.5 (L) 06/23/2022 0455   HGB 13.4 11/18/2018 1054   HCT 34.3 (L) 06/23/2022 0455   HCT 39.8 11/18/2018 1054   PLT 142 (L) 06/23/2022 0455   PLT 159 11/18/2018 1054   MCV 97.2 06/23/2022 0455   MCV 98 (H) 11/18/2018 1054   MCV 94 07/18/2013 0932   MCH 32.6 06/23/2022 0455   MCHC 33.5 06/23/2022 0455   RDW 13.1 06/23/2022 0455   RDW 13.0 11/18/2018 1054   RDW 14.1 07/18/2013 0932   LYMPHSABS 2.3 06/22/2022 1345   LYMPHSABS 2.8 07/18/2013 0932   MONOABS 0.8 06/22/2022 1345   MONOABS 0.9 07/18/2013 0932   EOSABS 0.6 (H) 06/22/2022 1345   EOSABS 0.6 07/18/2013 0932   BASOSABS 0.1 06/22/2022 1345   BASOSABS 0.1 07/18/2013 0932      Latest Ref Rng & Units 06/23/2022    4:55 AM 06/22/2022    4:13 PM 08/11/2021   11:26 AM  BMP  Glucose 70 - 99 mg/dL 96  96  116   BUN 8 - 23 mg/dL '24  25  29   '$ Creatinine 0.61 - 1.24 mg/dL 1.14  1.06  1.25   BUN/Creat Ratio 6 - 22 (calc)   23   Sodium 135 - 145 mmol/L 136  138  141   Potassium 3.5 - 5.1 mmol/L 4.1  4.2  4.5   Chloride 98 - 111 mmol/L 103  103  105   CO2 22 - 32 mmol/L 27  27  33   Calcium 8.9 - 10.3 mg/dL 8.2  8.7  8.7       Disposition Plan & Communication  Patient status: Inpatient  Admitted From: Home Planned disposition location:  Home health vs SNF pending PT/OT evaluations  Anticipated discharge date: 3/7 pending post-op recovery and PT recs  Family Communication: clergy at bedside     Author: Dr. Nicole Kindred, DO [facility] 06/23/2022, 12:23 PM   Available by Epic secure chat 7AM-7PM. If 7PM-7AM, please contact night-coverage.  [facility] contact information found on [email or web address].

## 2022-06-24 NOTE — NC FL2 (Signed)
[place] LEVEL OF CARE FORM     IDENTIFICATION  [name] Birthdate: Nov 19, 1934 Sex: male Admission Date (Current Location): 06/22/2022  [facility] and [place] Number:  Engineering geologist and Address:  [facility], [address], [place], [place] [address]      Provider Number: [id number]  Attending Physician Name and Address:  Ezekiel Slocumb, DO  Relative Name and Phone Number:  Zachary George [phone]    Current Level of Care: Hospital Recommended Level of Care: [place] Prior Approval Number:    Date Approved/Denied:   PASRR Number: [id number]  Discharge Plan: SNF    Current Diagnoses: Patient Active Problem List   Diagnosis Date Noted   Fall 06/23/2022   Fracture of left tibia and fibula, closed, initial encounter 06/22/2022   Cognitive deficit as late effect of cerebrovascular accident (CVA) 06/25/2020   Imbalance 02/01/2018   Humeral head fracture, left, closed, initial encounter 09/22/2017   History of cerebrovascular accident (CVA) with residual deficit 09/09/2017   CKD (chronic kidney disease), stage III ([place]) 09/01/2017   Elevated PSA, between 10 and less than 20 ng/ml 07/22/2016   Bilateral lower extremity edema 07/22/2016   Abnormal glucose 07/15/2016   BPH with obstruction/lower urinary tract symptoms 04/10/2016   Benign hypertension with CKD (chronic kidney disease) stage III ([place]) 04/10/2016   Memory loss 04/10/2016   Gastric outlet obstruction 12/02/2014   Generalized abdominal pain    Benign fibroma of prostate 01/02/2011   HLD (hyperlipidemia) 01/02/2011   Obesity (BMI 30.0-34.9) 01/02/2011    Orientation RESPIRATION BLADDER Height & Weight     Self, Time, Situation, Place  Normal Continent Weight: 102.1 kg Height:  '5\' 10"'$  (177.8 cm)  BEHAVIORAL SYMPTOMS/MOOD NEUROLOGICAL BOWEL NUTRITION STATUS      Continent Diet (See DC summary)  AMBULATORY STATUS COMMUNICATION  OF NEEDS Skin   Extensive Assist Verbally Normal, Surgical wounds                       Personal Care Assistance Level of Assistance  Bathing, Dressing, Feeding Bathing Assistance: Maximum assistance Feeding assistance: Limited assistance Dressing Assistance: Maximum assistance     Functional Limitations Info  Sight, Hearing, Speech Sight Info: Adequate Hearing Info: Impaired Speech Info: Adequate    SPECIAL CARE FACTORS FREQUENCY  PT (By licensed PT), OT (By licensed OT)     PT Frequency: 5 times per week OT Frequency: 5 times per week            Contractures Contractures Info: Not present    Additional Factors Info  Code Status, Allergies Code Status Info: full code Allergies Info: NKDA           Current Medications (06/24/2022):  This is the current hospital active medication list Current Facility-Administered Medications  Medication Dose Route Frequency Provider Last Rate Last Admin   0.9 %  sodium chloride infusion   Intravenous Continuous Thornton Park, MD 75 mL/hr at 06/24/22 1303 New Bag at 06/24/22 1303   acetaminophen (TYLENOL) tablet 650 mg  650 mg Oral Q6H PRN Thornton Park, MD       Or   acetaminophen (TYLENOL) suppository 650 mg  650 mg Rectal Q6H PRN Thornton Park, MD       acetaminophen (TYLENOL) tablet 1,000 mg  1,000 mg Oral Q6H Thornton Park, MD   1,000 mg at 06/24/22 1258   alum & mag hydroxide-simeth (MAALOX/MYLANTA) 200-200-20 MG/5ML suspension 30 mL  30 mL Oral Q4H PRN Thornton Park, MD       aspirin EC tablet 325 mg  325 mg Oral Daily Thornton Park, MD       atorvastatin (LIPITOR) tablet 40 mg  40 mg Oral QHS Thornton Park, MD   40 mg at 06/23/22 2053   bisacodyl (DULCOLAX) suppository 10 mg  10 mg Rectal Daily PRN Thornton Park, MD       ceFAZolin (ANCEF) IVPB 2g/100 mL premix  2 g Intravenous Q8H Thornton Park, MD       clopidogrel (PLAVIX) tablet 75 mg  75 mg Oral Daily Thornton Park, MD       docusate  sodium (COLACE) capsule 100 mg  100 mg Oral BID Thornton Park, MD   100 mg at 06/24/22 1258   finasteride (PROSCAR) tablet 5 mg  5 mg Oral Daily Thornton Park, MD   5 mg at 06/24/22 0933   lisinopril (ZESTRIL) tablet 20 mg  20 mg Oral Daily Thornton Park, MD   20 mg at 06/24/22 [id number]   menthol-cetylpyridinium (CEPACOL) lozenge 3 mg  1 lozenge Oral PRN Thornton Park, MD       Or   phenol (CHLORASEPTIC) mouth spray 1 spray  1 spray Mouth/Throat PRN Thornton Park, MD       methocarbamol (ROBAXIN) tablet 500 mg  500 mg Oral Q6H PRN Thornton Park, MD       Or   methocarbamol (ROBAXIN) 500 mg in dextrose 5 % 50 mL IVPB  500 mg Intravenous Q6H PRN Thornton Park, MD       morphine (PF) 2 MG/ML injection 2 mg  2 mg Intravenous Q4H PRN Thornton Park, MD       ondansetron [facility]) tablet 4 mg  4 mg Oral Q6H PRN Thornton Park, MD       Or   ondansetron [facility]) injection 4 mg  4 mg Intravenous Q6H PRN Thornton Park, MD       oxyCODONE (Oxy IR/ROXICODONE) immediate release tablet 5-10 mg  5-10 mg Oral Q4H PRN Thornton Park, MD       polyethylene glycol (MIRALAX / GLYCOLAX) packet 17 g  17 g Oral Daily PRN Thornton Park, MD       senna Donavan Burnet) tablet 8.6 mg  1 tablet Oral BID Thornton Park, MD   8.6 mg at 06/24/22 1258   senna-docusate (Senokot-S) tablet 1 tablet  1 tablet Oral QHS PRN Thornton Park, MD       traMADol Veatrice Bourbon) tablet 50 mg  50 mg Oral Q6H Thornton Park, MD   50 mg at 06/24/22 1258     Discharge Medications: Please see discharge summary for a list of discharge medications.  Relevant Imaging Results:  Relevant Lab Results:   Additional Information SSN [id number]  Conception Oms, RN

## 2022-06-24 NOTE — Evaluation (Signed)
Physical Therapy Evaluation Patient Details Name: [name] MRN: [id number] DOB: 1934/07/23 Today's Date: 06/24/2022  History of Present Illness  n 88 year old male with history of hypertension, hyperlipidemia, BPH, who presents emergency department after a fall.  Suffered L distal tib/fib fracture and is now s/p ORIF 3/5.  Clinical Impression  Pt eager to work with PT and did show ability to maintain [place] on the L, however he fatigued very quickly to brief standing/ambulation effort.  He did not display the UE strength to sustain b/l feet off ground for long at all and each effortful step resulted in small (1-3") steps with each hop leading to quick fatigue; <20 ft even with standing rest break.  Pt will benefit from continued PT to address functional limitations and get to a point where he can safely manage in the home while maintaining precautions.      Recommendations for follow up therapy are one component of a multi-disciplinary discharge planning process, led by the attending physician.  Recommendations may be updated based on patient status, additional functional criteria and insurance authorization.  Follow Up Recommendations Skilled nursing-short term rehab (<3 hours/day) Can patient physically be transported by private vehicle: No    Assistance Recommended at Discharge Frequent or constant Supervision/Assistance  Patient can return home with the following  A lot of help with walking and/or transfers;A little help with bathing/dressing/bathroom;Assistance with cooking/housework;Assist for transportation;Help with stairs or ramp for entrance    Equipment Recommendations  (TBD at rehab)  Recommendations for Other Services       Functional Status Assessment Patient has had a recent decline in their functional status and demonstrates the ability to make significant improvements in function in a reasonable and predictable amount of time.     Precautions / Restrictions  Precautions Precautions: Fall Restrictions Weight Bearing Restrictions: Yes LLE Weight Bearing: Non weight bearing      Mobility  Bed Mobility Overal bed mobility: Needs Assistance Bed Mobility: Supine to Sit     Supine to sit: Min guard, Min assist     General bed mobility comments: extra time, use of rails, light assist to insure clearance of L foot off EOB    Transfers Overall transfer level: Needs assistance Equipment used: Rolling walker (2 wheels) Transfers: Sit to/from Stand Sit to Stand: Min assist           General transfer comment: cuing for set up and sequencing, cuing for NWBing compliance and positioning/use of AD    Ambulation/Gait Ambulation/Gait assistance: Min guard, Min assist Gait Distance (Feet): 18 Feet Assistive device: Rolling walker (2 wheels)         General Gait Details: With great effort pt was able to maintain L NWBing, but did not have the confidence, balance or UE strength to really keep b/l LE off the ground enough to advance R foot more than 2-3" per step.  Pt needed rest break at ~12 ft, did not sit wanting to do more and was able to do a few more hops before ultimately getting too fatigued to try and do more.  Stairs            Wheelchair Mobility    Modified Rankin (Stroke Patients Only)       Balance Overall balance assessment: Needs assistance Sitting-balance support: Bilateral upper extremity supported Sitting balance-Leahy Scale: Good     Standing balance support: Bilateral upper extremity supported Standing balance-Leahy Scale: Fair Standing balance comment: (appropriately) highly reliant on the walker but very quick  to fatigue and struggled to get a lot of L foot clearance while maintaining hopping balance - no LOBs but guarded, cautious and labored with brief standing effort                             Pertinent Vitals/Pain Pain Assessment Pain Assessment: No/denies pain ("If zero is no pain  I'm a minus 2")    Home Living Family/patient expects to be discharged to:: Unsure Living Arrangements: Spouse/significant other Available Help at Discharge: Available 24 hours/day;Family   Home Access: Stairs to enter Entrance Stairs-Rails: None Entrance Stairs-Number of Steps: 1   Home Layout: One level Home Equipment: Conservation officer, nature (2 wheels)      Prior Function Prior Level of Function : Independent/Modified Independent             Mobility Comments: pt reports no need for ADs, out of the home at least weekly for church and groceries ADLs Comments: reports independent     Hand Dominance        Extremity/Trunk Assessment   Upper Extremity Assessment Upper Extremity Assessment: Overall WFL for tasks assessed;Generalized weakness    Lower Extremity Assessment Lower Extremity Assessment: Overall WFL for tasks assessed;Generalized weakness       Communication   Communication: No difficulties  Cognition Arousal/Alertness: Awake/alert Behavior During Therapy: WFL for tasks assessed/performed Overall Cognitive Status: Within Functional Limits for tasks assessed                                          General Comments General comments (skin integrity, edema, etc.): Pt showed great effort and motivation, able to do some slow, effortful hopping but ultimately very functionally limited    Exercises Other Exercises Other Exercises: educated on APs and QS - multiple reps each   Assessment/Plan    PT Assessment Patient needs continued PT services  PT Problem List Decreased strength;Decreased range of motion;Decreased activity tolerance;Decreased balance;Decreased mobility;Decreased cognition;Decreased knowledge of use of DME;Decreased safety awareness;Pain       PT Treatment Interventions DME instruction;Gait training;Stair training;Functional mobility training;Therapeutic activities;Therapeutic exercise;Balance training;Neuromuscular  re-education;Patient/family education    PT Goals (Current goals can be found in the Care Plan section)  Acute Rehab PT Goals Patient Stated Goal: get moving well enough to go home PT Goal Formulation: With patient Time For Goal Achievement: 07/07/22 Potential to Achieve Goals: Good    Frequency 7X/week     Co-evaluation               AM-PAC PT "6 Clicks" Mobility  Outcome Measure Help needed turning from your back to your side while in a flat bed without using bedrails?: A Little Help needed moving from lying on your back to sitting on the side of a flat bed without using bedrails?: A Little Help needed moving to and from a bed to a chair (including a wheelchair)?: A Little Help needed standing up from a chair using your arms (e.g., wheelchair or bedside chair)?: A Little Help needed to walk in hospital room?: A Lot Help needed climbing 3-5 steps with a railing? : Total 6 Click Score: 15    End of Session Equipment Utilized During Treatment: Gait belt Activity Tolerance: Patient limited by fatigue;Patient tolerated treatment well Patient left: with chair alarm set;with call bell/phone within reach;with nursing/sitter in room Nurse Communication: Mobility status;Weight  bearing status PT Visit Diagnosis: Muscle weakness (generalized) (M62.81);Difficulty in walking, not elsewhere classified (R26.2);Unsteadiness on feet (R26.81)    Time: 1340-1420 PT Time Calculation (min) (ACUTE ONLY): 40 min   Charges:   PT Evaluation $PT Eval Low Complexity: 1 Low PT Treatments $Therapeutic Activity: 8-22 mins        Kreg Shropshire, DPT 06/24/2022, 2:43 PM

## 2022-06-24 NOTE — TOC Progression Note (Signed)
Transition of Care [facility]) - Progression Note    Patient Details  Name: [name] MRN: [id number] Date of Birth: [birth year]  Transition of Care [facility]) CM/SW [place], RN Phone Number: 06/24/2022, 2:35 PM  Clinical Narrative:    The patient has been to [facility] previously, He lets the therapist know that is his preferred facility as it is near his home, Fl2 Completed, PASSR obtained, Bedsearch sent   Expected Discharge Plan: Skilled Nursing Facility Barriers to Discharge: SNF Pending bed offer, Insurance Authorization  Expected Discharge Plan and Services                                               Social Determinants of Health (SDOH) Interventions SDOH Screenings   Food Insecurity: No Food Insecurity (06/23/2022)  Housing: Low Risk  (06/23/2022)  Transportation Needs: No Transportation Needs (06/23/2022)  Utilities: Not At Risk (06/23/2022)  Alcohol Screen: Low Risk  (11/24/2021)  Depression (PHQ2-9): Low Risk  (05/11/2022)  Financial Resource Strain: Low Risk  (02/16/2022)  Physical Activity: Sufficiently Active (02/16/2022)  Social Connections: Socially Integrated (02/16/2022)  Stress: No Stress Concern Present (02/16/2022)  Tobacco Use: Medium Risk (06/24/2022)    Readmission Risk Interventions     No data to display

## 2022-06-24 NOTE — Progress Notes (Signed)
Subjective:  POD #1 s/p ORIF of left trimalleolar ankle fracture.   Patient reports no pain in left ankle.  Patient has not yet seen physical therapy.  His wife is at the bedside.  Objective:   VITALS:   Vitals:   06/23/22 2019 06/23/22 2348 06/24/22 0413 06/24/22 0732  BP: 131/69 137/68 (!) 145/72 (!) 150/73  Pulse: 66 65 66 65  Resp: '20 20 20 15  '$ Temp: 98 F (36.7 C) 98 F (36.7 C) 98.6 F (37 C) 97.7 F (36.5 C)  TempSrc:      SpO2: 94% 95% 96% 96%  Weight:      Height:        PHYSICAL EXAM: Left lower extremity: Patient's AO splint remains clean and dry.  He has soft compartments of the left leg.  Patient can flex and extend his toes.  His toes are well-perfused and he has intact sensation light touch in all 5 toes in the first dorsal webspace.  No skin irritation from the splint.   LABS  Results for orders placed or performed during the hospital encounter of 06/22/22 (from the past 24 hour(s))  CBC     Status: Abnormal   Collection Time: 06/24/22  4:43 AM  Result Value Ref Range   WBC 12.8 (H) 4.0 - 10.5 K/uL   RBC 3.57 (L) 4.22 - 5.81 MIL/uL   Hemoglobin 11.6 (L) 13.0 - 17.0 g/dL   HCT 35.1 (L) 39.0 - 52.0 %   MCV 98.3 80.0 - 100.0 fL   MCH 32.5 26.0 - 34.0 pg   MCHC 33.0 30.0 - 36.0 g/dL   RDW 12.8 11.5 - 15.5 %   Platelets 154 150 - 400 K/uL   nRBC 0.0 0.0 - 0.2 %  Basic metabolic panel     Status: Abnormal   Collection Time: 06/24/22 12:14 PM  Result Value Ref Range   Sodium 136 135 - 145 mmol/L   Potassium 3.8 3.5 - 5.1 mmol/L   Chloride 103 98 - 111 mmol/L   CO2 27 22 - 32 mmol/L   Glucose, Bld 146 (H) 70 - 99 mg/dL   BUN 28 (H) 8 - 23 mg/dL   Creatinine, Ser 1.23 0.61 - 1.24 mg/dL   Calcium 8.1 (L) 8.9 - 10.3 mg/dL   GFR, Estimated 58 (L) >60 mL/min   Anion gap 6 5 - 15    DG Ankle Complete Left  Result Date: 06/23/2022 CLINICAL DATA:  Closed ankle fracture EXAM: LEFT ANKLE COMPLETE - 3+ VIEW COMPARISON:  Intraoperative radiographs earlier  today FINDINGS: Interval surgical changes of ORIF of bimalleolar fracture. A lateral buttress plate and screw construct with additional inter fragmentary screw transfix the fibular fracture site. The medial malleolar fracture site is transfix by 2 cannulated lag screws. The ankle mortise appears congruent. No evidence of immediate hardware complication. Fiberglass casting material is present. Surgical staples are present. IMPRESSION: Interval ORIF of bimalleolar fracture without evidence of immediate hardware complication. Electronically Signed   By: Jacqulynn Cadet M.D.   On: 06/23/2022 14:54   DG MINI C-ARM IMAGE ONLY  Result Date: 06/23/2022 There is no interpretation for this exam.  This order is for images obtained during a surgical procedure.  Please See "Surgeries" Tab for more information regarding the procedure.   CT Cervical Spine Wo Contrast  Result Date: 06/22/2022 CLINICAL DATA:  Trauma, fall EXAM: CT CERVICAL SPINE WITHOUT CONTRAST TECHNIQUE: Multidetector CT imaging of the cervical spine was performed without intravenous contrast. Multiplanar CT  image reconstructions were also generated. RADIATION DOSE REDUCTION: This exam was performed according to the departmental dose-optimization program which includes automated exposure control, adjustment of the mA and/or kV according to patient size and/or use of iterative reconstruction technique. COMPARISON:  CT angiogram of neck done on 09/09/2017 FINDINGS: Alignment: There is old ununited fracture in the base of odontoid process of C2 vertebra. Posterior margin of odontoid process is 12 mm posterior to the body of C2 vertebra. This finding has not changed. Skull base and vertebrae: No recent fracture is seen. Soft tissues and spinal canal: There is extrinsic pressure over the ventral margin of thecal sac caused by bony spurs, more so on the left side at C6-C7 level. Disc levels: There is encroachment of neural foramina by bony spurs from C3 to C7  levels. Upper chest: No acute findings are seen. Other: None. IMPRESSION: There is old displaced ununited fracture in base of odontoid process with no significant interval change since 09/09/2017. No recent fracture is seen in cervical spine.  Cervical spondylosis. Electronically Signed   By: Elmer Picker M.D.   On: 06/22/2022 15:13   DG Ankle 2 Views Left  Result Date: 06/22/2022 CLINICAL DATA:  Status post reduction. EXAM: LEFT ANKLE - 2 VIEW COMPARISON:  Same day. FINDINGS: The left ankle has been casted and immobilized. There is mildly improved alignment of the distal tibial and fibular fractures, although significant displacement remains. IMPRESSION: Status post casting and immobilization of moderately displaced distal left tibial and fibular fractures. Electronically Signed   By: Marijo Conception M.D.   On: 06/22/2022 15:10   CT Head Wo Contrast  Result Date: 06/22/2022 CLINICAL DATA:  Trauma, fall EXAM: CT HEAD WITHOUT CONTRAST TECHNIQUE: Contiguous axial images were obtained from the base of the skull through the vertex without intravenous contrast. RADIATION DOSE REDUCTION: This exam was performed according to the departmental dose-optimization program which includes automated exposure control, adjustment of the mA and/or kV according to patient size and/or use of iterative reconstruction technique. COMPARISON:  09/08/2017 FINDINGS: Brain: No acute intracranial findings are seen. There are no signs of bleeding within the cranium. Cortical sulci are prominent. There is decreased density in periventricular and subcortical white matter. Vascular: Unremarkable. Skull: No fracture is seen in calvarium. Sinuses/Orbits: There are no air-fluid levels. There is mild mucosal thickening in ethmoid sinus. Other: None. IMPRESSION: No acute intracranial findings are seen in noncontrast CT brain. Atrophy. Small-vessel disease. Electronically Signed   By: Elmer Picker M.D.   On: 06/22/2022 15:05     Assessment/Plan: 1 Day Post-Op   Principal Problem:   Fracture of left tibia and fibula, closed, initial encounter Active Problems:   Benign fibroma of prostate   HLD (hyperlipidemia)   BPH with obstruction/lower urinary tract symptoms   Benign hypertension with CKD (chronic kidney disease) stage III (HCC)   History of cerebrovascular accident (CVA) with residual deficit   Cognitive deficit as late effect of cerebrovascular accident (CVA)   Fall  Patient doing well postop.  He is afebrile with stable vital signs.  Patient is awaiting PT evaluation.  It is possible the patient may require skilled nursing facility upon discharge.  He is to remain nonweightbearing in the left lower extremity for a total of 6 to 8 weeks postop.  Patient will take his baseline Plavix and aspirin for DVT prophylaxis postop.  Patient will follow-up with orthopedics in 10 to 14 days postop to remove his splint, remove his staples and place a cast.  Thornton Park , MD 06/24/2022, 1:54 PM

## 2022-06-25 ENCOUNTER — Encounter: Payer: Self-pay | Admitting: Orthopedic Surgery

## 2022-06-25 DIAGNOSIS — N179 Acute kidney failure, unspecified: Secondary | ICD-10-CM | POA: Diagnosis not present

## 2022-06-25 LAB — CBC
HCT: 31.2 % — ABNORMAL LOW (ref 39.0–52.0)
Hemoglobin: 10.4 g/dL — ABNORMAL LOW (ref 13.0–17.0)
MCH: 33 pg (ref 26.0–34.0)
MCHC: 33.3 g/dL (ref 30.0–36.0)
MCV: 99 fL (ref 80.0–100.0)
Platelets: 147 10*3/uL — ABNORMAL LOW (ref 150–400)
RBC: 3.15 MIL/uL — ABNORMAL LOW (ref 4.22–5.81)
RDW: 13.3 % (ref 11.5–15.5)
WBC: 13 10*3/uL — ABNORMAL HIGH (ref 4.0–10.5)
nRBC: 0 % (ref 0.0–0.2)

## 2022-06-25 LAB — BASIC METABOLIC PANEL
Anion gap: 7 (ref 5–15)
BUN: 36 mg/dL — ABNORMAL HIGH (ref 8–23)
CO2: 26 mmol/L (ref 22–32)
Calcium: 7.7 mg/dL — ABNORMAL LOW (ref 8.9–10.3)
Chloride: 105 mmol/L (ref 98–111)
Creatinine, Ser: 1.41 mg/dL — ABNORMAL HIGH (ref 0.61–1.24)
GFR, Estimated: 49 mL/min — ABNORMAL LOW (ref >60)
Glucose, Bld: 123 mg/dL — ABNORMAL HIGH (ref 70–99)
Potassium: 4 mmol/L (ref 3.5–5.1)
Sodium: 138 mmol/L (ref 135–145)

## 2022-06-25 MED ORDER — SODIUM CHLORIDE 0.9 % IV SOLN: INTRAVENOUS | Status: DC

## 2022-06-25 NOTE — Plan of Care (Signed)

## 2022-06-25 NOTE — TOC Progression Note (Signed)
Transition of Care [facility]) - Progression Note    Patient Details  Name: [name] MRN: [id number] Date of Birth: Feb 13, 1935  Transition of Care [facility]) CM/SW [place], RN Phone Number: 06/25/2022, 1:37 PM  Clinical Narrative:   Received a call from [place] with [employer].  SNF authorization approved.  SNF approval number is [id number].  EMS approval number is [id number].   Patient will be a tentative discharge for tomorrow.  I have made Gina with [facility] aware.  Gina's cell phone number is [phone].    Expected Discharge Plan: Skilled Nursing Facility Barriers to Discharge: SNF Pending bed offer, Insurance Authorization  Expected Discharge Plan and Services                                               Social Determinants of Health (SDOH) Interventions SDOH Screenings   Food Insecurity: No Food Insecurity (06/23/2022)  Housing: Low Risk  (06/23/2022)  Transportation Needs: No Transportation Needs (06/23/2022)  Utilities: Not At Risk (06/23/2022)  Alcohol Screen: Low Risk  (11/24/2021)  Depression (PHQ2-9): Low Risk  (05/11/2022)  Financial Resource Strain: Low Risk  (02/16/2022)  Physical Activity: Sufficiently Active (02/16/2022)  Social Connections: Socially Integrated (02/16/2022)  Stress: No Stress Concern Present (02/16/2022)  Tobacco Use: Medium Risk (06/25/2022)    Readmission Risk Interventions     No data to display

## 2022-06-25 NOTE — Progress Notes (Signed)
Physical Therapy Treatment Patient Details Name: [name] MRN: [id number] DOB: [birth year] Today's Date: 06/25/2022   History of Present Illness Pt is a 88 year old male with history of hypertension, hyperlipidemia, BPH, who presents emergency department after a fall.  Suffered L distal tib/fib fracture and is now s/p ORIF 3/5.    PT Comments    Pt again pleasant and showing good effort with PT but ultimately remains very limited and quick to fatigue with attempts at hopping, weight shifting, etc.  Pt was able to manage ~3 ft of forward motion after many minutes of very labored, small hopping steps.  He was effective at keeping weight off the L LE but UEs simply do not have the strength to allow efficient or prolonged hopping efforts.  Educated on UE strengthening exercises; despite desire to return home pt now in full agreement that he will need STR when discharged.     Recommendations for follow up therapy are one component of a multi-disciplinary discharge planning process, led by the attending physician.  Recommendations may be updated based on patient status, additional functional criteria and insurance authorization.  Follow Up Recommendations  Skilled nursing-short term rehab (<3 hours/day) Can patient physically be transported by private vehicle: No   Assistance Recommended at Discharge Frequent or constant Supervision/Assistance  Patient can return home with the following A lot of help with walking and/or transfers;A little help with bathing/dressing/bathroom;Assistance with cooking/housework;Assist for transportation;Help with stairs or ramp for entrance   Equipment Recommendations   (TBD)    Recommendations for Other Services       Precautions / Restrictions Precautions Precautions: Fall Restrictions LLE Weight Bearing: Non weight bearing     Mobility  Bed Mobility Overal bed mobility: Modified Independent Bed Mobility: Supine to Sit, Sit to Supine     Supine to  sit: Min guard Sit to supine: Min guard   General bed mobility comments: Pt able to get to/from supine/EOB w/o direct assist    Transfers Overall transfer level: Needs assistance Equipment used: Rolling walker (2 wheels) Transfers: Sit to/from Stand Sit to Stand: Mod assist, From elevated surface           General transfer comment: unable to rise from lowest bed setting with minA, elevated ~3" and able to rise with light assist    Ambulation/Gait Ambulation/Gait assistance: Min assist, Mod assist Gait Distance (Feet): 3 Feet Assistive device: Rolling walker (2 wheels)         General Gait Details: Pt did relatively well maintaining LNWBing but struggled to effectively use UEs on RW to elevate body and hop/lift R LE.  He showed great effort and determination but managed only a few feet despite many hopping/weight shifting attempts.  Pt fatigued with the effort and PT needed to pull seat up behind him to insure safe descent.  Pt with poor tolerance for prolonged standing/unweighting but again did well with maintaining L NWBing   Stairs             Wheelchair Mobility    Modified Rankin (Stroke Patients Only)       Balance Overall balance assessment: Needs assistance Sitting-balance support: Bilateral upper extremity supported Sitting balance-Leahy Scale: Good     Standing balance support: Bilateral upper extremity supported, During functional activity, Reliant on assistive device for balance Standing balance-Leahy Scale: Poor Standing balance comment: poor tolerance in standing, difficulty with hopping attempts.  No over LOBs but definite need for chair follow as he could not efficiently hop/move in standing  Cognition Arousal/Alertness: Awake/alert Behavior During Therapy: Impulsive, WFL for tasks assessed/performed Overall Cognitive Status: Within Functional Limits for tasks assessed                                           Exercises      General Comments        Pertinent Vitals/Pain Pain Assessment Pain Assessment: No/denies pain    Home Living                          Prior Function            PT Goals (current goals can now be found in the care plan section) Progress towards PT goals: Progressing toward goals    Frequency    7X/week      PT Plan Current plan remains appropriate    Co-evaluation              AM-PAC PT "6 Clicks" Mobility   Outcome Measure  Help needed turning from your back to your side while in a flat bed without using bedrails?: A Little Help needed moving from lying on your back to sitting on the side of a flat bed without using bedrails?: A Little Help needed moving to and from a bed to a chair (including a wheelchair)?: A Little Help needed standing up from a chair using your arms (e.g., wheelchair or bedside chair)?: A Little Help needed to walk in hospital room?: Total Help needed climbing 3-5 steps with a railing? : Total 6 Click Score: 14    End of Session Equipment Utilized During Treatment: Gait belt Activity Tolerance: Patient limited by fatigue;Patient tolerated treatment well Patient left: with bed alarm set;with call bell/phone within reach   PT Visit Diagnosis: Muscle weakness (generalized) (M62.81);Difficulty in walking, not elsewhere classified (R26.2);Unsteadiness on feet (R26.81)     Time: [id number] PT Time Calculation (min) (ACUTE ONLY): 27 min  Charges:  $Gait Training: 8-22 mins $Therapeutic Activity: 8-22 mins                     Kreg Shropshire, DPT 06/25/2022, 1:35 PM

## 2022-06-25 NOTE — Progress Notes (Signed)
  Subjective:  POD #2 s/p ORIF left trimalleolar ankle fracture.   Patient reports no pain in the left ankle.  Patient has no complaints.  Objective:   VITALS:   Vitals:   06/24/22 1649 06/24/22 2305 06/25/22 0732 06/25/22 1538  BP: 110/65 (!) 102/54 113/63 (!) 125/108  Pulse: 72 62 65 68  Resp: '18 18 15 15  '$ Temp: 98.3 F (36.8 C) 98.7 F (37.1 C) 98 F (36.7 C) 98.3 F (36.8 C)  TempSrc: Oral     SpO2: 96% 95% 93% 96%  Weight:      Height:        PHYSICAL EXAM: Left lower extremity: AO splint remains clean and dry. Patient can flex and extend his toes.  His toes are well-perfused.  He has intact sensation to light touch in all 5 toes in the first dorsal webspace.   LABS  Results for orders placed or performed during the hospital encounter of 06/22/22 (from the past 24 hour(s))  Basic metabolic panel     Status: Abnormal   Collection Time: 06/25/22  2:43 AM  Result Value Ref Range   Sodium 138 135 - 145 mmol/L   Potassium 4.0 3.5 - 5.1 mmol/L   Chloride 105 98 - 111 mmol/L   CO2 26 22 - 32 mmol/L   Glucose, Bld 123 (H) 70 - 99 mg/dL   BUN 36 (H) 8 - 23 mg/dL   Creatinine, Ser 1.41 (H) 0.61 - 1.24 mg/dL   Calcium 7.7 (L) 8.9 - 10.3 mg/dL   GFR, Estimated 49 (L) >60 mL/min   Anion gap 7 5 - 15  CBC     Status: Abnormal   Collection Time: 06/25/22  2:43 AM  Result Value Ref Range   WBC 13.0 (H) 4.0 - 10.5 K/uL   RBC 3.15 (L) 4.22 - 5.81 MIL/uL   Hemoglobin 10.4 (L) 13.0 - 17.0 g/dL   HCT 31.2 (L) 39.0 - 52.0 %   MCV 99.0 80.0 - 100.0 fL   MCH 33.0 26.0 - 34.0 pg   MCHC 33.3 30.0 - 36.0 g/dL   RDW 13.3 11.5 - 15.5 %   Platelets 147 (L) 150 - 400 K/uL   nRBC 0.0 0.0 - 0.2 %    No results found.  Assessment/Plan: 2 Days Post-Op   Principal Problem:   Fracture of left tibia and fibula, closed, initial encounter Active Problems:   Benign fibroma of prostate   HLD (hyperlipidemia)   BPH with obstruction/lower urinary tract symptoms   Benign hypertension  with CKD (chronic kidney disease) stage III (HCC)   History of cerebrovascular accident (CVA) with residual deficit   Cognitive deficit as late effect of cerebrovascular accident (CVA)   Fall   AKI (acute kidney injury) ([place])  Patient doing well postop from an orthopedic standpoint.  Patient is awaiting discharge to a skilled nursing facility.  Continue physical therapy until discharge.  The patient will follow-up with Dr. Selinda Eon in [place] 10 to 14 days after discharge from the hospital.  Patient will remain on Plavix and aspirin which will cover him for DVT prophylaxis until follow-up in the office.  Remain nonweightbearing on the left lower extremity for a total of 6 to 8 weeks postop.    Thornton Park , MD 06/25/2022, 4:02 PM

## 2022-06-25 NOTE — Plan of Care (Signed)

## 2022-06-25 NOTE — TOC Progression Note (Signed)
Transition of Care [facility]) - Progression Note    Patient Details  Name: [name] MRN: [id number] Date of Birth: 1934/06/27  Transition of Care [facility]) CM/SW [place], RN Phone Number: 06/25/2022, 9:53 AM  Clinical Narrative:   Bed offers reviewed with patient.  Patient has accepted [facility].  I have informed Tammy, Admissions coordinator at [employer].    I have called [employer] and requested SNF authorization.  I have also requested for EMS authorization.  I have informed insurance company that patient is stable for discharge today and would like SNF authorization as soon as possible.  I have spoken with [facility] with [employer].  Contact number is [phone].  TOC will continue to follow for discharge planning.     Expected Discharge Plan: Skilled Nursing Facility Barriers to Discharge: SNF Pending bed offer, Insurance Authorization  Expected Discharge Plan and Services                                               Social Determinants of Health (SDOH) Interventions SDOH Screenings   Food Insecurity: No Food Insecurity (06/23/2022)  Housing: Low Risk  (06/23/2022)  Transportation Needs: No Transportation Needs (06/23/2022)  Utilities: Not At Risk (06/23/2022)  Alcohol Screen: Low Risk  (11/24/2021)  Depression (PHQ2-9): Low Risk  (05/11/2022)  Financial Resource Strain: Low Risk  (02/16/2022)  Physical Activity: Sufficiently Active (02/16/2022)  Social Connections: Socially Integrated (02/16/2022)  Stress: No Stress Concern Present (02/16/2022)  Tobacco Use: Medium Risk (06/25/2022)    Readmission Risk Interventions     No data to display

## 2022-06-25 NOTE — Plan of Care (Signed)

## 2022-06-25 NOTE — Progress Notes (Addendum)
PROGRESS NOTE  RIDGE DUMOND    DOB: 1934/04/27, 88 y.o.  [id number]    Code Status: Full Code   DOA: 06/22/2022   LOS: 3   Brief hospital course  [name] is a 88 y.o. male with a PMH significant for hypertension, hyperlipidemia, BPH .  They presented from home to the ED on 06/22/2022 with fall from a stair. States that he was stepping up the stairs and the next thing he knew, it was like his leg was wet spaghetti noodles.  He lives independently with his wife and is her caregiver. He is a retired Scientist, research (life sciences).    In the ED, it was found that they had temperature of 97.6, respiration rate of 17, heart rate 62, blood pressure 140/84, SpO2 of 98% on room air. .  Significant findings included Na 138, potassium 4.2, chloride 103, bicarb 27, nonfasting blood glucose 96, BUN 25, serum creatinine 1.06, EGFR greater than 60, WBC 10, hemoglobin 12.1, platelets of 155. Head/neck CT was negative for acute abnormalities. L ankle xray revealed: moderately displaced distal tibia and fibula fractures.   They were initially treated with manual reduction and casting by EDP.  Ortho surgery was consulted.  Patient was admitted to medicine service for further workup and management of ankle fracture as outlined in detail below.  06/24/22 -stable.  [name]/OT evaluations pending for discharge planning.  Assessment & Plan  Principal Problem:   Fracture of left tibia and fibula, closed, initial encounter Active Problems:   Benign fibroma of prostate   HLD (hyperlipidemia)   BPH with obstruction/lower urinary tract symptoms   Benign hypertension with CKD (chronic kidney disease) stage III (HCC)   History of cerebrovascular accident (CVA) with residual deficit   Cognitive deficit as late effect of cerebrovascular accident (CVA)   Fall   AKI (acute kidney injury) ([place])  Fracture of left tibia and fibula, closed, initial encounter Closed distal fracture of left tibia and fibula.  - Orthopedic surgery  following - Underwent ORIF on 3/5, doing well post-op, minimal pain - analgesia PRN - ASA/Plavix suffice for DVT ppx - [name]/OT evaluation - SNF recommended. - TOC following for placement - Ortho follow up in 10-14 days post op for splint removal, staple removal, cast placement - Non-weight-bearing on LLE x 6-8 weeks post op  Acute Kidney Injury - not POA. Cr today up to 1.41, from 1.06 on admission. Suspect pre-renal, mild dehydration  -- Start IV fluids x 1 day -- repeat BMP in AM  Leukocytosis - likely reactive in setting of fracture and surgery No s/sx's of infection, afebrile --Monitor clinically for s/sx's of infection --Monitor CBC  History of cerebrovascular accident (CVA) with residual deficit - Aspirin 81 mg, atorvastatin 40 mg nightly   Benign hypertension with CKD (chronic kidney disease) stage III (HCC) - Lisinopril 20 mg daily    BPH with obstruction/lower urinary tract symptoms - finasteride 5 mg daily   HLD (hyperlipidemia) - Atorvastatin 40 mg nightly   Obesity Body mass index is 32.28 kg/m. Complicates overall care and prognosis.   Recommend lifestyle modifications including physical activity and diet for weight loss and overall long-term health.   VTE ppx: SCDs Start: 06/24/22 1145 Place TED hose Start: 06/24/22 1145  Diet:     Diet   Diet regular Room service appropriate? Yes; Fluid consistency: Thin   Consultants: Ortho surgery   Subjective 06/25/22    [name] awake having breakfast when seen this AM.  He denies significant pain  or other complaints.  Enjoyed breakfast and "cleaned the plate".  Remains agreeable to rehab at [facility].     Objective   Vitals:   06/24/22 1519 06/24/22 1520 06/24/22 2305 06/25/22 0732  BP: 126/62 126/62 (!) 102/54 113/63  Pulse: 67 67 62 65  Resp: '14 14 18 15  '$ Temp: 97.8 F (36.6 C) 97.8 F (36.6 C) 98.7 F (37.1 C) 98 F (36.7 C)  TempSrc:      SpO2: 97% 97% 95% 93%  Weight:      Height:         Intake/Output Summary (Last 24 hours) at 06/25/2022 1236 Last data filed at 06/25/2022 0806 Gross per 24 hour  Intake 570.66 ml  Output 1050 ml  Net -479.34 ml   Filed Weights   06/23/22 1029  Weight: 102.1 kg    Physical Exam:   General exam: awake, alert, no acute distress HEENT: moist mucus membranes, hearing grossly normal  Respiratory system: CTAB, no wheezes, rales or rhonchi, normal respiratory effort. Cardiovascular system: normal S1/S2, RRR no pedal edema.   Gastrointestinal system: soft, NT, ND, no HSM felt, +bowel sounds. Central nervous system: A&O x 4. no gross focal neurologic deficits, normal speech Extremities: left leg and foot in clean dry intact dressing and bandage, left foot distal sensation intact and normal temp Skin: dry, intact, normal temperature Psychiatry: normal mood, congruent affect, judgement and insight appear normal   Labs   I have personally reviewed the following labs and imaging studies CBC    Component Value Date/Time   WBC 13.0 (H) 06/25/2022 0243   RBC 3.15 (L) 06/25/2022 0243   HGB 10.4 (L) 06/25/2022 0243   HGB 13.4 11/18/2018 1054   HCT 31.2 (L) 06/25/2022 0243   HCT 39.8 11/18/2018 1054   PLT 147 (L) 06/25/2022 0243   PLT 159 11/18/2018 1054   MCV 99.0 06/25/2022 0243   MCV 98 (H) 11/18/2018 1054   MCV 94 07/18/2013 0932   MCH 33.0 06/25/2022 0243   MCHC 33.3 06/25/2022 0243   RDW 13.3 06/25/2022 0243   RDW 13.0 11/18/2018 1054   RDW 14.1 07/18/2013 0932   LYMPHSABS 2.3 06/22/2022 1345   LYMPHSABS 2.8 07/18/2013 0932   MONOABS 0.8 06/22/2022 1345   MONOABS 0.9 07/18/2013 0932   EOSABS 0.6 (H) 06/22/2022 1345   EOSABS 0.6 07/18/2013 0932   BASOSABS 0.1 06/22/2022 1345   BASOSABS 0.1 07/18/2013 0932      Latest Ref Rng & Units 06/25/2022    2:43 AM 06/24/2022   12:14 PM 06/23/2022    4:55 AM  BMP  Glucose 70 - 99 mg/dL 123  146  96   BUN 8 - 23 mg/dL 36  28  24   Creatinine 0.61 - 1.24 mg/dL 1.41  1.23  1.14   Sodium  135 - 145 mmol/L 138  136  136   Potassium 3.5 - 5.1 mmol/L 4.0  3.8  4.1   Chloride 98 - 111 mmol/L 105  103  103   CO2 22 - 32 mmol/L '26  27  27   '$ Calcium 8.9 - 10.3 mg/dL 7.7  8.1  8.2       Disposition Plan & Communication  Patient status: Inpatient  Admitted From: Home Planned disposition location:  SNF  Anticipated discharge date: 3/8-9 pending improved renal function and SNF bed available  Family Communication: none at bedside on rounds. Will attempt to call.    Author: Dr. Nicole Kindred, DO [facility] 06/25/2022,  12:36 PM   Available by Epic secure chat 7AM-7PM. If 7PM-7AM, please contact night-coverage.  [facility] contact information found on [email or web address].

## 2022-06-25 NOTE — Progress Notes (Deleted)
Patient has a positive antibody screen but sais she has not received a blood transfusion

## 2022-06-26 DIAGNOSIS — R634 Abnormal weight loss: Secondary | ICD-10-CM | POA: Diagnosis not present

## 2022-06-26 DIAGNOSIS — S82402D Unspecified fracture of shaft of left fibula, subsequent encounter for closed fracture with routine healing: Secondary | ICD-10-CM | POA: Diagnosis not present

## 2022-06-26 DIAGNOSIS — R41841 Cognitive communication deficit: Secondary | ICD-10-CM | POA: Diagnosis not present

## 2022-06-26 DIAGNOSIS — M84469D Pathological fracture, unspecified tibia and fibula, subsequent encounter for fracture with routine healing: Secondary | ICD-10-CM | POA: Diagnosis not present

## 2022-06-26 DIAGNOSIS — E876 Hypokalemia: Secondary | ICD-10-CM | POA: Insufficient documentation

## 2022-06-26 DIAGNOSIS — M6259 Muscle wasting and atrophy, not elsewhere classified, multiple sites: Secondary | ICD-10-CM | POA: Diagnosis not present

## 2022-06-26 DIAGNOSIS — N400 Enlarged prostate without lower urinary tract symptoms: Secondary | ICD-10-CM | POA: Diagnosis not present

## 2022-06-26 DIAGNOSIS — Z7401 Bed confinement status: Secondary | ICD-10-CM | POA: Diagnosis not present

## 2022-06-26 DIAGNOSIS — N390 Urinary tract infection, site not specified: Secondary | ICD-10-CM | POA: Diagnosis not present

## 2022-06-26 DIAGNOSIS — S82852A Displaced trimalleolar fracture of left lower leg, initial encounter for closed fracture: Secondary | ICD-10-CM | POA: Diagnosis not present

## 2022-06-26 DIAGNOSIS — E872 Acidosis, unspecified: Secondary | ICD-10-CM | POA: Diagnosis not present

## 2022-06-26 DIAGNOSIS — S82202A Unspecified fracture of shaft of left tibia, initial encounter for closed fracture: Secondary | ICD-10-CM | POA: Diagnosis not present

## 2022-06-26 DIAGNOSIS — I10 Essential (primary) hypertension: Secondary | ICD-10-CM | POA: Diagnosis not present

## 2022-06-26 DIAGNOSIS — E669 Obesity, unspecified: Secondary | ICD-10-CM | POA: Diagnosis not present

## 2022-06-26 DIAGNOSIS — Z79899 Other long term (current) drug therapy: Secondary | ICD-10-CM | POA: Diagnosis not present

## 2022-06-26 DIAGNOSIS — E441 Mild protein-calorie malnutrition: Secondary | ICD-10-CM | POA: Diagnosis not present

## 2022-06-26 DIAGNOSIS — I639 Cerebral infarction, unspecified: Secondary | ICD-10-CM | POA: Diagnosis not present

## 2022-06-26 DIAGNOSIS — N179 Acute kidney failure, unspecified: Secondary | ICD-10-CM | POA: Diagnosis not present

## 2022-06-26 DIAGNOSIS — R2681 Unsteadiness on feet: Secondary | ICD-10-CM | POA: Diagnosis not present

## 2022-06-26 DIAGNOSIS — S82402A Unspecified fracture of shaft of left fibula, initial encounter for closed fracture: Secondary | ICD-10-CM | POA: Diagnosis not present

## 2022-06-26 DIAGNOSIS — E785 Hyperlipidemia, unspecified: Secondary | ICD-10-CM | POA: Diagnosis not present

## 2022-06-26 DIAGNOSIS — J309 Allergic rhinitis, unspecified: Secondary | ICD-10-CM | POA: Diagnosis not present

## 2022-06-26 DIAGNOSIS — D291 Benign neoplasm of prostate: Secondary | ICD-10-CM | POA: Diagnosis not present

## 2022-06-26 DIAGNOSIS — N183 Chronic kidney disease, stage 3 unspecified: Secondary | ICD-10-CM | POA: Diagnosis not present

## 2022-06-26 DIAGNOSIS — S82202D Unspecified fracture of shaft of left tibia, subsequent encounter for closed fracture with routine healing: Secondary | ICD-10-CM | POA: Diagnosis not present

## 2022-06-26 DIAGNOSIS — Z8673 Personal history of transient ischemic attack (TIA), and cerebral infarction without residual deficits: Secondary | ICD-10-CM | POA: Diagnosis not present

## 2022-06-26 DIAGNOSIS — U071 COVID-19: Secondary | ICD-10-CM | POA: Diagnosis not present

## 2022-06-26 LAB — CBC
HCT: 27.8 % — ABNORMAL LOW (ref 39.0–52.0)
Hemoglobin: 9.3 g/dL — ABNORMAL LOW (ref 13.0–17.0)
MCH: 33 pg (ref 26.0–34.0)
MCHC: 33.5 g/dL (ref 30.0–36.0)
MCV: 98.6 fL (ref 80.0–100.0)
Platelets: 137 10*3/uL — ABNORMAL LOW (ref 150–400)
RBC: 2.82 MIL/uL — ABNORMAL LOW (ref 4.22–5.81)
RDW: 13.2 % (ref 11.5–15.5)
WBC: 9.1 10*3/uL (ref 4.0–10.5)
nRBC: 0 % (ref 0.0–0.2)

## 2022-06-26 LAB — BASIC METABOLIC PANEL
Anion gap: 2 — ABNORMAL LOW (ref 5–15)
Anion gap: 7 (ref 5–15)
BUN: 25 mg/dL — ABNORMAL HIGH (ref 8–23)
BUN: 26 mg/dL — ABNORMAL HIGH (ref 8–23)
CO2: 21 mmol/L — ABNORMAL LOW (ref 22–32)
CO2: 25 mmol/L (ref 22–32)
Calcium: 6.1 mg/dL — CL (ref 8.9–10.3)
Calcium: 8.1 mg/dL — ABNORMAL LOW (ref 8.9–10.3)
Chloride: 114 mmol/L — ABNORMAL HIGH (ref 98–111)
Chloride: 104 mmol/L (ref 98–111)
Creatinine, Ser: 0.96 mg/dL (ref 0.61–1.24)
Creatinine, Ser: 1.18 mg/dL (ref 0.61–1.24)
GFR, Estimated: >60 mL/min (ref >60)
GFR, Estimated: >60 mL/min (ref >60)
Glucose, Bld: 92 mg/dL (ref 70–99)
Glucose, Bld: 123 mg/dL — ABNORMAL HIGH (ref 70–99)
Potassium: 3.1 mmol/L — ABNORMAL LOW (ref 3.5–5.1)
Potassium: 4.4 mmol/L (ref 3.5–5.1)
Sodium: 137 mmol/L (ref 135–145)
Sodium: 136 mmol/L (ref 135–145)

## 2022-06-26 LAB — MAGNESIUM
Magnesium: 1.5 mg/dL — ABNORMAL LOW (ref 1.7–2.4)
Magnesium: 2.8 mg/dL — ABNORMAL HIGH (ref 1.7–2.4)

## 2022-06-26 LAB — ALBUMIN: Albumin: 2.2 g/dL — ABNORMAL LOW (ref 3.5–5.0)

## 2022-06-26 MED ORDER — MAGNESIUM SULFATE 4 GM/100ML IV SOLN
4.0000 g | Freq: Once | INTRAVENOUS | Status: AC
  Administered 2022-06-26: 4 g via INTRAVENOUS
  Filled 2022-06-26: qty 100

## 2022-06-26 MED ORDER — ACETAMINOPHEN 325 MG PO TABS: 650.0000 mg | ORAL_TABLET | Freq: Four times a day (QID) | ORAL | Status: AC | PRN

## 2022-06-26 MED ORDER — ALUM & MAG HYDROXIDE-SIMETH 200-200-20 MG/5ML PO SUSP: 30.0000 mL | ORAL | 0 refills | Status: DC | PRN

## 2022-06-26 MED ORDER — POLYETHYLENE GLYCOL 3350 17 G PO PACK: 17.0000 g | PACK | Freq: Every day | ORAL | 0 refills | Status: DC | PRN

## 2022-06-26 MED ORDER — POTASSIUM CHLORIDE CRYS ER 20 MEQ PO TBCR
40.0000 meq | EXTENDED_RELEASE_TABLET | ORAL | Status: AC
  Administered 2022-06-26 (×2): 40 meq via ORAL
  Filled 2022-06-26 (×2): qty 2

## 2022-06-26 MED ORDER — DOCUSATE SODIUM 100 MG PO CAPS: 100.0000 mg | ORAL_CAPSULE | Freq: Two times a day (BID) | ORAL | 0 refills | Status: DC

## 2022-06-26 MED ORDER — LISINOPRIL 10 MG PO TABS: 10.0000 mg | ORAL_TABLET | Freq: Every day | ORAL | Status: DC

## 2022-06-26 MED ORDER — ASPIRIN 325 MG PO TBEC: 325.0000 mg | DELAYED_RELEASE_TABLET | Freq: Every day | ORAL | Status: DC

## 2022-06-26 MED ORDER — TRAMADOL HCL 50 MG PO TABS: 50.0000 mg | ORAL_TABLET | Freq: Four times a day (QID) | ORAL | 0 refills | Status: DC | PRN

## 2022-06-26 MED ORDER — POTASSIUM CHLORIDE CRYS ER 20 MEQ PO TBCR: 40.0000 meq | EXTENDED_RELEASE_TABLET | Freq: Every day | ORAL | 0 refills | Status: DC

## 2022-06-26 MED ORDER — CALCIUM GLUCONATE-NACL 1-0.675 GM/50ML-% IV SOLN
1.0000 g | Freq: Once | INTRAVENOUS | Status: AC
  Administered 2022-06-26: 1000 mg via INTRAVENOUS
  Filled 2022-06-26: qty 50

## 2022-06-26 NOTE — TOC Progression Note (Signed)
Transition of Care [facility]) - Progression Note    Patient Details  Name: MARKELLE NELLI MRN: [id number] Date of Birth: June 05, 1934  Transition of Care [facility]) CM/SW [place], RN Phone Number: 06/26/2022, 3:49 PM  Clinical Narrative:   Notified that patient is stable for discharge.  Additional lab work was collected and Provider reports that patient is stable for discharge to SNF today.  I have informed Barnett Applebaum with [facility] that patient is stable for discharge today.  Barnett Applebaum reports that room number is 48 and nursing team to call report to [phone] I have informed staff nurse of the above information.  I have called and informed patient and his wife that he Mr. Kadar would be going to [facility] today.  I have informed patient and his wife that his room number will be [id number].    EMS arranged and will transport to facility today.    I have sent patient's SNF transfer packet, Discharge orders, and discharge summary to  [facility].      Expected Discharge Plan: Skilled Nursing Facility Barriers to Discharge: SNF Pending bed offer, Insurance Authorization  Expected Discharge Plan and Services         Expected Discharge Date: 06/26/22                                     Social Determinants of Health (SDOH) Interventions SDOH Screenings   Food Insecurity: No Food Insecurity (06/23/2022)  Housing: Low Risk  (06/23/2022)  Transportation Needs: No Transportation Needs (06/23/2022)  Utilities: Not At Risk (06/23/2022)  Alcohol Screen: Low Risk  (11/24/2021)  Depression (PHQ2-9): Low Risk  (05/11/2022)  Financial Resource Strain: Low Risk  (02/16/2022)  Physical Activity: Sufficiently Active (02/16/2022)  Social Connections: Socially Integrated (02/16/2022)  Stress: No Stress Concern Present (02/16/2022)  Tobacco Use: Medium Risk (06/25/2022)    Readmission Risk Interventions     No data to display

## 2022-06-26 NOTE — Progress Notes (Signed)
Occupational Therapy Treatment Patient Details Name: [name] MRN: [id number] DOB: Feb 03, 1935 Today's Date: 06/26/2022   History of present illness Pt is an 88 year old male with history of hypertension, hyperlipidemia, BPH, who presents emergency department after a fall.  Suffered L distal tib/fib fracture and is now s/p ORIF 3/5.   OT comments  [name] has made good progress. He is able to recall WBing precautions this date and to demonstrate that he can stand with RW w/o placing weight on L LE. He can maintain standing balance only a short time, however, and finds it difficult to transfer or to ambulate even a short distance. Provided educ re: AE for LB dressing, bathing, with pt endorsing understanding and able to provide teach-back. He is able to perform grooming and UB bathing in sitting w/o any physical assistance, following set-up. He denies pain at present and is oriented to time, place, and situation. Continue to recommend DC to SNF to assist pt in returning to PLOF.   Recommendations for follow up therapy are one component of a multi-disciplinary discharge planning process, led by the attending physician.  Recommendations may be updated based on patient status, additional functional criteria and insurance authorization.    Follow Up Recommendations  Skilled nursing-short term rehab (<3 hours/day)     Assistance Recommended at Discharge Frequent or constant Supervision/Assistance  Patient can return home with the following  A lot of help with bathing/dressing/bathroom;A lot of help with walking and/or transfers;Assistance with cooking/housework;Assist for transportation;Help with stairs or ramp for entrance   Equipment Recommendations       Recommendations for Other Services      Precautions / Restrictions Precautions Precautions: Fall Restrictions Weight Bearing Restrictions: Yes LLE Weight Bearing: Non weight bearing       Mobility Bed Mobility                General bed mobility comments: Pt received/left in recliner    Transfers Overall transfer level: Needs assistance Equipment used: Rolling walker (2 wheels) Transfers: Sit to/from Stand Sit to Stand: Min assist           General transfer comment: Pt able to maintain WBing requirements     Balance Overall balance assessment: Needs assistance Sitting-balance support: Bilateral upper extremity supported Sitting balance-Leahy Scale: Good     Standing balance support: Bilateral upper extremity supported, During functional activity, Reliant on assistive device for balance Standing balance-Leahy Scale: Fair Standing balance comment: Able to maintain standing balance < 1 minute while adhering to WB requirments                           ADL either performed or assessed with clinical judgement   ADL Overall ADL's : Needs assistance/impaired     Grooming: Wash/dry face;Wash/dry hands;Oral care;Sitting;Modified independent;Set up   Upper Body Bathing: Set up;Supervision/ safety;Sitting       Upper Body Dressing : Supervision/safety;Sitting;Set up   Lower Body Dressing: Maximal assistance;Sitting/lateral leans                      Extremity/Trunk Assessment Upper Extremity Assessment Upper Extremity Assessment: Generalized weakness   Lower Extremity Assessment Lower Extremity Assessment: Generalized weakness        Vision       Perception     Praxis      Cognition Arousal/Alertness: Awake/alert Behavior During Therapy: WFL for tasks assessed/performed Overall Cognitive Status: Within Functional Limits for tasks assessed  Exercises Other Exercises Other Exercises: Educ re: AE for LB dressing, bathing; falls prevention; safe use of RW; WBing precautions    Shoulder Instructions       General Comments      Pertinent Vitals/ Pain       Pain Assessment Pain Assessment: No/denies  pain  Home Living                                          Prior Functioning/Environment              Frequency  Min 2X/week        Progress Toward Goals  OT Goals(current goals can now be found in the care plan section)  Progress towards OT goals: Progressing toward goals  Acute Rehab OT Goals OT Goal Formulation: With patient/family Time For Goal Achievement: 07/08/22 Potential to Achieve Goals: Good  Plan Discharge plan remains appropriate    Co-evaluation                 AM-PAC OT "6 Clicks" Daily Activity     Outcome Measure   Help from another person eating meals?: None Help from another person taking care of personal grooming?: A Little Help from another person toileting, which includes using toliet, bedpan, or urinal?: A Lot Help from another person bathing (including washing, rinsing, drying)?: A Lot Help from another person to put on and taking off regular upper body clothing?: None Help from another person to put on and taking off regular lower body clothing?: A Lot 6 Click Score: 17    End of Session Equipment Utilized During Treatment: Rolling walker (2 wheels)  OT Visit Diagnosis: Unsteadiness on feet (R26.81);Muscle weakness (generalized) (M62.81)   Activity Tolerance Patient tolerated treatment well   Patient Left in chair;with call bell/phone within reach;with chair alarm set   Nurse Communication          Time: 1126-1140 OT Time Calculation (min): 14 min  Charges: OT General Charges $OT Visit: 1 Visit OT Treatments $Self Care/Home Management : 8-22 mins Jonathan Lobo, PhD, MS, OTR/L 06/26/22, 1:26 PM

## 2022-06-26 NOTE — Progress Notes (Addendum)
       CROSS COVER NOTE  NAME: [name] MRN: [id number] DOB : 1934/10/06    HPI/Events of Note   Report: Nurse reports calcium critical 6.1 On review of chart: H & P labs reviewed   Assessment and  Interventions   Assessment: Albumin added to labs =2.2 = corrected calcium  7.5 mag added to labs = 1.5 Potassium 3.1 Creatinine 0.96 much improved Plan: 1gm calcium gluconate IV ordered 4 gm IV mag  Potassium 40 meq q 4h x 2 doses Follow up labs per day team preference       Kathlene Cote NP [facility]

## 2022-06-26 NOTE — Progress Notes (Signed)
Report called to [name] at [facility]. Patient leaving via EMS. Discharged packet gathered including paper script.

## 2022-06-26 NOTE — Progress Notes (Signed)
Subjective:  POD #3 s/p open reduction internal fixation of trimalleolar left ankle fracture.   Patient reports left ankle pain as minimal.  Patient is up out of bed to a chair.  He had increased calcium levels and has been treated by the hospitalist nurse practitioner.  Patient awaiting a bed at a skilled nursing facility.  Objective:   VITALS:   Vitals:   06/25/22 0732 06/25/22 1538 06/26/22 0015 06/26/22 0844  BP: 113/63 (!) 125/108 (!) 127/59 137/60  Pulse: 65 68 74 79  Resp: '15 15 20 16  '$ Temp: 98 F (36.7 C) 98.3 F (36.8 C) 98.2 F (36.8 C) 98 F (36.7 C)  TempSrc:      SpO2: 93% 96% 97% 95%  Weight:      Height:        PHYSICAL EXAM: Left lower extremity:  AO splint remains clean and dry. Patient can flex and extend his toes.  His toes are well-perfused.  He has intact sensation to light touch in all 5 toes in the first dorsal webspace.  LABS Results for orders placed or performed during the hospital encounter of 06/22/22 (from the past 24 hour(s))  CBC     Status: Abnormal   Collection Time: 06/26/22  4:20 AM  Result Value Ref Range   WBC 9.1 4.0 - 10.5 K/uL   RBC 2.82 (L) 4.22 - 5.81 MIL/uL   Hemoglobin 9.3 (L) 13.0 - 17.0 g/dL   HCT 27.8 (L) 39.0 - 52.0 %   MCV 98.6 80.0 - 100.0 fL   MCH 33.0 26.0 - 34.0 pg   MCHC 33.5 30.0 - 36.0 g/dL   RDW 13.2 11.5 - 15.5 %   Platelets 137 (L) 150 - 400 K/uL   nRBC 0.0 0.0 - 0.2 %  Basic metabolic panel     Status: Abnormal   Collection Time: 06/26/22  4:20 AM  Result Value Ref Range   Sodium 137 135 - 145 mmol/L   Potassium 3.1 (L) 3.5 - 5.1 mmol/L   Chloride 114 (H) 98 - 111 mmol/L   CO2 21 (L) 22 - 32 mmol/L   Glucose, Bld 92 70 - 99 mg/dL   BUN 25 (H) 8 - 23 mg/dL   Creatinine, Ser 0.96 0.61 - 1.24 mg/dL   Calcium 6.1 (LL) 8.9 - 10.3 mg/dL   GFR, Estimated >60 >60 mL/min   Anion gap 2 (L) 5 - 15  Albumin     Status: Abnormal   Collection Time: 06/26/22  4:20 AM  Result Value Ref Range   Albumin 2.2 (L)  3.5 - 5.0 g/dL  Magnesium     Status: Abnormal   Collection Time: 06/26/22  4:20 AM  Result Value Ref Range   Magnesium 1.5 (L) 1.7 - 2.4 mg/dL    No results found.  Assessment/Plan: 3 Days Post-Op   Principal Problem:   Fracture of left tibia and fibula, closed, initial encounter Active Problems:   Benign fibroma of prostate   HLD (hyperlipidemia)   BPH with obstruction/lower urinary tract symptoms   Benign hypertension with CKD (chronic kidney disease) stage III (HCC)   History of cerebrovascular accident (CVA) with residual deficit   Cognitive deficit as late effect of cerebrovascular accident (CVA)   Fall   AKI (acute kidney injury) ([place])   Hypokalemia   Hypomagnesemia   Hypocalcemia   Normal anion gap metabolic acidosis  Patient doing well postop from an orthopedic standpoint.  Patient is awaiting discharge to a skilled nursing  facility.  Continue physical therapy until discharge.  The patient will follow-up with Dr. Selinda Eon in [place] 10 to 14 days after discharge from the hospital.  Patient will remain on Plavix and aspirin which will cover him for DVT prophylaxis until follow-up in the office.  Remain nonweightbearing on the left lower extremity for a total of 6 to 8 weeks postop.      Thornton Park , MD 06/26/2022, 12:19 PM

## 2022-06-26 NOTE — Care Management Important Message (Signed)
Important Message  Patient Details  Name: ROMANDO MELAHN MRN: [id number] Date of Birth: January 27, 1935   Medicare Important Message Given:  Yes  Obtained initial consent for Medicare IM.  Copy left with patient for reference, original to be scanned in chart.   Dannette Barbara 06/26/2022, 3:25 PM

## 2022-06-26 NOTE — Progress Notes (Signed)
Physical Therapy Treatment Patient Details Name: [name] MRN: [id number] DOB: [birth year] Today's Date: 06/26/2022   History of Present Illness Pt is an 88 year old male with history of hypertension, hyperlipidemia, BPH, who presents emergency department after a fall.  Suffered L distal tib/fib fracture and is now s/p ORIF 3/5.    PT Comments    Pt was pleasant and motivated to participate during the session and put forth good effort throughout. Pt required extra time and effort with all functional tasks along with significant multi-modal cuing for sequencing with transfers and gait.  Pt ambulated with hop-to pattern and only able to take several very small, shuffling, hop-to steps before fatiguing and needing the chair to be rolled behind him. Good compliance with LLE NWB status throughout.  Pt will benefit from PT services in a SNF setting upon discharge to safely address deficits listed in patient problem list for decreased caregiver assistance and eventual return to PLOF.     Recommendations for follow up therapy are one component of a multi-disciplinary discharge planning process, led by the attending physician.  Recommendations may be updated based on patient status, additional functional criteria and insurance authorization.  Follow Up Recommendations  Skilled nursing-short term rehab (<3 hours/day) Can patient physically be transported by private vehicle: No   Assistance Recommended at Discharge Frequent or constant Supervision/Assistance  Patient can return home with the following A lot of help with walking and/or transfers;A little help with bathing/dressing/bathroom;Assistance with cooking/housework;Assist for transportation;Help with stairs or ramp for entrance   Equipment Recommendations  Other (comment) (TBD at next venue of care)    Recommendations for Other Services       Precautions / Restrictions Precautions Precautions: Fall Restrictions Weight Bearing  Restrictions: Yes LLE Weight Bearing: Non weight bearing     Mobility  Bed Mobility Overal bed mobility: Modified Independent             General bed mobility comments: Mod extra time and effort and use of bed rail    Transfers Overall transfer level: Needs assistance Equipment used: Rolling walker (2 wheels) Transfers: Sit to/from Stand Sit to Stand: Min assist, From elevated surface           General transfer comment: Min A to come to full upright standing with bed elevated and mod multi-modal cues for sequencing for WB compliance    Ambulation/Gait Ambulation/Gait assistance: Min guard Gait Distance (Feet): 3 Feet Assistive device: Rolling walker (2 wheels)   Gait velocity: decreased     General Gait Details: Mod verbal and visual cues for sequencing for hop-to pattern with pt only able to take several very small, shuffling, hop-to steps before fatiguing and needing the chair to be rolled behind him.  Good compliance with LLE NWB status throughout.   Stairs             Wheelchair Mobility    Modified Rankin (Stroke Patients Only)       Balance Overall balance assessment: Needs assistance, History of Falls Sitting-balance support: Bilateral upper extremity supported Sitting balance-Leahy Scale: Good     Standing balance support: Bilateral upper extremity supported, During functional activity, Reliant on assistive device for balance Standing balance-Leahy Scale: Poor                              Cognition Arousal/Alertness: Awake/alert Behavior During Therapy: WFL for tasks assessed/performed Overall Cognitive Status: Within Functional Limits for tasks assessed  Exercises Total Joint Exercises Ankle Circles/Pumps: AROM, Strengthening, Right, 5 reps, 10 reps Quad Sets: Strengthening, Both, 10 reps, 5 reps Gluteal Sets: Strengthening, Both, 5 reps, 10 reps Heel Slides:  Strengthening, Right, 10 reps Hip ABduction/ADduction: Strengthening, Both, 10 reps Straight Leg Raises: Strengthening, Both, 10 reps Long Arc Quad: Strengthening, Both, 5 reps, 10 reps Knee Flexion: Strengthening, Both, 5 reps, 10 reps Other Exercises Other Exercises: HEP education for RLE APs and BLE QS, GS, and LAQs    General Comments        Pertinent Vitals/Pain Pain Assessment Pain Assessment: No/denies pain    Home Living                          Prior Function            PT Goals (current goals can now be found in the care plan section) Progress towards PT goals: Progressing toward goals    Frequency    7X/week      PT Plan Current plan remains appropriate    Co-evaluation              AM-PAC PT "6 Clicks" Mobility   Outcome Measure  Help needed turning from your back to your side while in a flat bed without using bedrails?: A Little Help needed moving from lying on your back to sitting on the side of a flat bed without using bedrails?: A Little Help needed moving to and from a bed to a chair (including a wheelchair)?: A Little Help needed standing up from a chair using your arms (e.g., wheelchair or bedside chair)?: A Little Help needed to walk in hospital room?: Total Help needed climbing 3-5 steps with a railing? : Total 6 Click Score: 14    End of Session Equipment Utilized During Treatment: Gait belt Activity Tolerance: Patient tolerated treatment well Patient left: in chair;with call bell/phone within reach;with chair alarm set;with SCD's reapplied Nurse Communication: Mobility status;Weight bearing status PT Visit Diagnosis: Muscle weakness (generalized) (M62.81);Difficulty in walking, not elsewhere classified (R26.2);Unsteadiness on feet (R26.81)     Time: [id number] PT Time Calculation (min) (ACUTE ONLY): 24 min  Charges:  $Therapeutic Exercise: 8-22 mins $Therapeutic Activity: 8-22 mins                     D. Scott  Vasili Fok PT, DPT 06/26/22, 11:06 AM

## 2022-06-26 NOTE — Discharge Summary (Addendum)
Physician Discharge Summary   Patient: [name] MRN: [id number] DOB: 06/14/1934  Admit date:     06/22/2022  Discharge date: 06/26/22  Discharge Physician: Ezekiel Slocumb   PCP: [name], DO   Recommendations at discharge:   Follow up with [facility] in 10-14 days Follow up with Primary Care Repeat BMP, Mg, CBC in 1 week Maintain Non-weightbearing on LEFT lower extremity x 6-8 weeks   Discharge Diagnoses: Principal Problem:   Fracture of left tibia and fibula, closed, initial encounter Active Problems:   Benign fibroma of prostate   HLD (hyperlipidemia)   BPH with obstruction/lower urinary tract symptoms   Benign hypertension with CKD (chronic kidney disease) stage III (HCC)   History of cerebrovascular accident (CVA) with residual deficit   Cognitive deficit as late effect of cerebrovascular accident (CVA)   Fall   AKI (acute kidney injury) ([place])   Hypokalemia   Hypomagnesemia   Hypocalcemia   Normal anion gap metabolic acidosis  Resolved Problems:   * No resolved hospital problems. [facility] Course: [name] is an 88 year old male with history of hypertension, hyperlipidemia, BPH, who presents emergency department for chief concerns of tripping and falling.  Vitals in the ED showed temperature of 97.6, respiration rate of 17, heart rate 62, blood pressure 140/84, SpO2 of 98% on room air.  Serum Na 138, potassium 4.2, chloride 103, bicarb 27, nonfasting blood glucose 96, BUN 25, serum creatinine 1.06, EGFR greater than 60, WBC 10, hemoglobin 12.1, platelets of 155.  ED treatment: Patient is status post left knee reduction via EDP.  EDP consulted orthopedic service, Dr. Mack Guise who is requesting hospitalist admission in anticipation of OR on 06/23/2022.  Assessment and Plan:    Expand All Collapse All   PROGRESS NOTE   [name]                             DOB: 1934/12/26, 89 y.o.  [id number]    Code Status: Full Code         DOA:  06/22/2022                                    LOS: 3    Brief hospital course  [name] is a 88 y.o. male with a PMH significant for hypertension, hyperlipidemia, BPH .   They presented from home to the ED on 06/22/2022 with fall from a stair. States that he was stepping up the stairs and the next thing he knew, it was like his leg was wet spaghetti noodles.  He lives independently with his wife and is her caregiver. He is a retired Scientist, research (life sciences).     In the ED, it was found that they had temperature of 97.6, respiration rate of 17, heart rate 62, blood pressure 140/84, SpO2 of 98% on room air. .  Significant findings included Na 138, potassium 4.2, chloride 103, bicarb 27, nonfasting blood glucose 96, BUN 25, serum creatinine 1.06, EGFR greater than 60, WBC 10, hemoglobin 12.1, platelets of 155. Head/neck CT was negative for acute abnormalities. L ankle xray revealed: moderately displaced distal tibia and fibula fractures.    They were initially treated with manual reduction and casting by EDP.  Ortho surgery was consulted.   Patient was admitted to medicine service for further workup and management of ankle fracture as  outlined in detail below.   06/26/22 -stable for discharge to rehab. Needed electrolytes replaced, repeat labs improved.    Needs repeat labs in 5-7 days.    Assessment & Plan  Principal Problem:   Fracture of left tibia and fibula, closed, initial encounter Active Problems:   Benign fibroma of prostate   HLD (hyperlipidemia)   BPH with obstruction/lower urinary tract symptoms   Benign hypertension with CKD (chronic kidney disease) stage III (HCC)   History of cerebrovascular accident (CVA) with residual deficit   Cognitive deficit as late effect of cerebrovascular accident (CVA)   Fall   AKI (acute kidney injury) ([place])   Fracture of left tibia and fibula, closed, initial encounter Closed distal fracture of left tibia and fibula.  - Orthopedic surgery following -  Underwent ORIF on 3/5, doing well post-op, minimal pain - analgesia PRN - ASA/Plavix for DVT ppx - PT/OT evaluation - SNF recommended. - Ortho follow up in 10-14 days post op for splint removal, staple removal, cast placement - Non-weight-bearing on LLE x 6-8 weeks post op   Acute Kidney Injury - not POA. Cr today up to 1.41, from 1.06 on admission. Suspect pre-renal, mild dehydration  Cr improved to 0.96 -- Treated with IV fluids x 1 day and resolved -- repeat BMP, Mg level in 1 week   Hypomagnesemia  Replaced Mg for level of 1.5 --repeat Mg level in 1 week  Hypocalcemia - Ca dropped to 6.1 this AM, corrects to 7.5 (albumin 2.2). --Given IV calcium gluconate --Will continue on short course of PO calcium, but unlikely to need ongoing replacement, seems acute --Repeat BMP and albumin level in 1 week  Leukocytosis - likely reactive in setting of fracture and surgery No s/sx's of infection, afebrile --Monitor clinically for s/sx's of infection --Repeat CBC in 1 week   History of cerebrovascular accident (CVA) with residual deficit - Aspirin 81 mg (will be on 325 mg x 2 weeks, then resume 81 mg), Plavix, Lipitor    Benign hypertension with CKD (chronic kidney disease) stage III (HCC) - Lisinopril 20 mg daily    BPH with obstruction/lower urinary tract symptoms - finasteride 5 mg daily   HLD (hyperlipidemia) - Atorvastatin 40 mg nightly    Obesity Body mass index is 32.28 kg/m. Complicates overall care and prognosis.   Recommend lifestyle modifications including physical activity and diet for weight loss and overall long-term health.             Consultants: Orthopedic surgery Procedures performed: ORIF of left trimalleolar fracture   Disposition: Skilled nursing facility  Diet recommendation:  Regular diet   DISCHARGE MEDICATION: Allergies as of 06/26/2022   No Known Allergies      Medication List     TAKE these medications    acetaminophen 325 MG  tablet Commonly known as: TYLENOL Take 2 tablets (650 mg total) by mouth every 6 (six) hours as needed for mild pain, fever or headache (or Fever >/= 101).   alum & mag hydroxide-simeth 200-200-20 MG/5ML suspension Commonly known as: MAALOX/MYLANTA Take 30 mLs by mouth every 4 (four) hours as needed for indigestion.   aspirin EC 325 MG tablet Take 1 tablet (325 mg total) by mouth daily. For 2 weeks.  On 07/10/2022, resume normal dose 81 mg daily. Start taking on: June 27, 2022 What changed:  medication strength how much to take when to take this additional instructions   atorvastatin 40 MG tablet Commonly known as: LIPITOR Take 1 tablet (40  mg total) by mouth daily.   clopidogrel 75 MG tablet Commonly known as: PLAVIX TAKE 1 TABLET BY MOUTH IN THE MORNING (BLOOD  THINNER)   docusate sodium 100 MG capsule Commonly known as: COLACE Take 1 capsule (100 mg total) by mouth 2 (two) times daily. Hold if having loose or frequent stools   finasteride 5 MG tablet Commonly known as: PROSCAR Take 1 tablet by mouth once daily   lisinopril 10 MG tablet Commonly known as: ZESTRIL Take 1 tablet (10 mg total) by mouth daily. Hold if SBP<120 or DBP<60 What changed:  medication strength See the new instructions.   MULTIVITAMIN ADULTS 50+ PO Take by mouth.   Omega-3 1000 MG Caps Take 1 capsule by mouth in the morning.   polyethylene glycol 17 g packet Commonly known as: MIRALAX / GLYCOLAX Take 17 g by mouth daily as needed for mild constipation.   potassium chloride SA 20 MEQ tablet Commonly known as: KLOR-CON M Take 2 tablets (40 mEq total) by mouth daily for 7 days.   traMADol 50 MG tablet Commonly known as: ULTRAM Take 1 tablet (50 mg total) by mouth every 6 (six) hours as needed.        Contact information for after-discharge care     Destination     [facility] Preferred SNF .   Service: Skilled Nursing Contact information: [address] [place] [phone]                    Discharge Exam: Danley Danker Weights   06/23/22 1029  Weight: 102.1 kg   General exam: awake, alert, no acute distress HEENT: atraumatic, clear conjunctiva, anicteric sclera, moist mucus membranes, hearing grossly normal  Respiratory system: CTAB, no wheezes, rales or rhonchi, normal respiratory effort. Cardiovascular system: normal S1/S2, RRR, no JVD, murmurs, rubs, gallops, no pedal edema.   Gastrointestinal system: soft, NT, ND, no HSM felt, +bowel sounds. Central nervous system: A&O x 3. no gross focal neurologic deficits, normal speech Extremities: left foot clean dry intact dressing in place with intact distal sensation, no edema, normal tone Skin: dry, intact, normal temperature Psychiatry: normal mood, congruent affect, judgement and insight appear normal   Condition at discharge: stable  The results of significant diagnostics from this hospitalization (including imaging, microbiology, ancillary and laboratory) are listed below for reference.   Imaging Studies: DG Ankle Complete Left  Result Date: 06/23/2022 CLINICAL DATA:  Closed ankle fracture EXAM: LEFT ANKLE COMPLETE - 3+ VIEW COMPARISON:  Intraoperative radiographs earlier today FINDINGS: Interval surgical changes of ORIF of bimalleolar fracture. A lateral buttress plate and screw construct with additional inter fragmentary screw transfix the fibular fracture site. The medial malleolar fracture site is transfix by 2 cannulated lag screws. The ankle mortise appears congruent. No evidence of immediate hardware complication. Fiberglass casting material is present. Surgical staples are present. IMPRESSION: Interval ORIF of bimalleolar fracture without evidence of immediate hardware complication. Electronically Signed   By: Jacqulynn Cadet M.D.   On: 06/23/2022 14:54   DG MINI C-ARM IMAGE ONLY  Result Date: 06/23/2022 There is no interpretation for this  exam.  This order is for images obtained during a surgical procedure.  Please See "Surgeries" Tab for more information regarding the procedure.   CT Cervical Spine Wo Contrast  Result Date: 06/22/2022 CLINICAL DATA:  Trauma, fall EXAM: CT CERVICAL SPINE WITHOUT CONTRAST TECHNIQUE: Multidetector CT imaging of the cervical spine was performed without intravenous contrast. Multiplanar CT  image reconstructions were also generated. RADIATION DOSE REDUCTION: This exam was performed according to the departmental dose-optimization program which includes automated exposure control, adjustment of the mA and/or kV according to patient size and/or use of iterative reconstruction technique. COMPARISON:  CT angiogram of neck done on 09/09/2017 FINDINGS: Alignment: There is old ununited fracture in the base of odontoid process of C2 vertebra. Posterior margin of odontoid process is 12 mm posterior to the body of C2 vertebra. This finding has not changed. Skull base and vertebrae: No recent fracture is seen. Soft tissues and spinal canal: There is extrinsic pressure over the ventral margin of thecal sac caused by bony spurs, more so on the left side at C6-C7 level. Disc levels: There is encroachment of neural foramina by bony spurs from C3 to C7 levels. Upper chest: No acute findings are seen. Other: None. IMPRESSION: There is old displaced ununited fracture in base of odontoid process with no significant interval change since 09/09/2017. No recent fracture is seen in cervical spine.  Cervical spondylosis. Electronically Signed   By: Elmer Picker M.D.   On: 06/22/2022 15:13   DG Ankle 2 Views Left  Result Date: 06/22/2022 CLINICAL DATA:  Status post reduction. EXAM: LEFT ANKLE - 2 VIEW COMPARISON:  Same day. FINDINGS: The left ankle has been casted and immobilized. There is mildly improved alignment of the distal tibial and fibular fractures, although significant displacement remains. IMPRESSION: Status post casting  and immobilization of moderately displaced distal left tibial and fibular fractures. Electronically Signed   By: Marijo Conception M.D.   On: 06/22/2022 15:10   CT Head Wo Contrast  Result Date: 06/22/2022 CLINICAL DATA:  Trauma, fall EXAM: CT HEAD WITHOUT CONTRAST TECHNIQUE: Contiguous axial images were obtained from the base of the skull through the vertex without intravenous contrast. RADIATION DOSE REDUCTION: This exam was performed according to the departmental dose-optimization program which includes automated exposure control, adjustment of the mA and/or kV according to patient size and/or use of iterative reconstruction technique. COMPARISON:  09/08/2017 FINDINGS: Brain: No acute intracranial findings are seen. There are no signs of bleeding within the cranium. Cortical sulci are prominent. There is decreased density in periventricular and subcortical white matter. Vascular: Unremarkable. Skull: No fracture is seen in calvarium. Sinuses/Orbits: There are no air-fluid levels. There is mild mucosal thickening in ethmoid sinus. Other: None. IMPRESSION: No acute intracranial findings are seen in noncontrast CT brain. Atrophy. Small-vessel disease. Electronically Signed   By: Elmer Picker M.D.   On: 06/22/2022 15:05   DG Ankle Complete Left  Result Date: 06/22/2022 CLINICAL DATA:  Left ankle pain after falling. EXAM: LEFT ANKLE COMPLETE - 3+ VIEW COMPARISON:  None Available. FINDINGS: There is a mildly comminuted transverse fracture through the base of the medial malleolus which is moderately displaced laterally. Oblique fracture of the distal fibular metadiaphysis is also moderately displaced laterally and posteriorly. There is a probable minimally displaced fracture of the posterior malleolus, best seen on the lateral view. The talus is laterally subluxed relative to the tibial plafond. No frank dislocation or tarsal bone fracture identified. Moderate surrounding soft tissue swelling. IMPRESSION:  Trimalleolar fracture subluxation of the left ankle as described. Electronically Signed   By: Richardean Sale M.D.   On: 06/22/2022 13:12    Microbiology: Results for orders placed or performed during the hospital encounter of 05/12/21  Urine Culture     Status: Abnormal   Collection Time: 05/12/21  8:59 AM   Specimen: Urine, Random  Result Value Ref Range Status   Specimen Description   Final    URINE, RANDOM Performed at [facility] [address], [place], [place] [address]    Special Requests   Final    NONE Performed at [facility], [address], [place], [place] [address]    Culture (A)  Final    10,000 COLONIES/mL ENTEROCOCCUS FAECALIS 10,000 COLONIES/mL STAPHYLOCOCCUS AUREUS    Report Status 05/17/2021 FINAL  Final   Organism ID, Bacteria ENTEROCOCCUS FAECALIS (A)  Final   Organism ID, Bacteria STAPHYLOCOCCUS AUREUS (A)  Final      Susceptibility   Enterococcus faecalis - MIC*    AMPICILLIN <=2 SENSITIVE Sensitive     NITROFURANTOIN <=16 SENSITIVE Sensitive     VANCOMYCIN 1 SENSITIVE Sensitive     * 10,000 COLONIES/mL ENTEROCOCCUS FAECALIS   Staphylococcus aureus - MIC*    CIPROFLOXACIN <=0.5 SENSITIVE Sensitive     GENTAMICIN <=0.5 SENSITIVE Sensitive     NITROFURANTOIN <=16 SENSITIVE Sensitive     OXACILLIN <=0.25 SENSITIVE Sensitive     TETRACYCLINE <=1 SENSITIVE Sensitive     VANCOMYCIN <=0.5 SENSITIVE Sensitive     TRIMETH/SULFA <=10 SENSITIVE Sensitive     CLINDAMYCIN <=0.25 SENSITIVE Sensitive     RIFAMPIN <=0.5 SENSITIVE Sensitive     Inducible Clindamycin NEGATIVE Sensitive     * 10,000 COLONIES/mL STAPHYLOCOCCUS AUREUS    Labs: CBC: Recent Labs  Lab 06/22/22 1345 06/23/22 0455 06/24/22 0443 06/25/22 0243 06/26/22 0420  WBC 10.0 7.9 12.8* 13.0* 9.1  NEUTROABS 6.2  --   --   --   --   HGB 12.1* 11.5* 11.6* 10.4* 9.3*  HCT 37.0* 34.3* 35.1* 31.2* 27.8*  MCV 99.7 97.2 98.3 99.0 98.6  PLT 155 142* 154 147* 137*    Basic Metabolic Panel: Recent Labs  Lab 06/23/22 0455 06/24/22 1214 06/25/22 0243 06/26/22 0420 06/26/22 1354  NA 136 136 138 137 136  K 4.1 3.8 4.0 3.1* 4.4  CL 103 103 105 114* 104  CO2 '27 27 26 '$ 21* 25  GLUCOSE 96 146* 123* 92 123*  BUN 24* 28* 36* 25* 26*  CREATININE 1.14 1.23 1.41* 0.96 1.18  CALCIUM 8.2* 8.1* 7.7* 6.1* 8.1*  MG  --   --   --  1.5* 2.8*   Liver Function Tests: Recent Labs  Lab 06/22/22 1613 06/26/22 0420  AST 20  --   ALT 16  --   ALKPHOS 61  --   BILITOT 0.5  --   PROT 6.9  --   ALBUMIN 3.8 2.2*   CBG: No results for input(s): "GLUCAP" in the last 168 hours.  Discharge time spent: greater than 30 minutes.  Signed: Ezekiel Slocumb, DO [facility] 06/26/2022

## 2022-06-26 NOTE — Progress Notes (Addendum)
Date and time results received: 06/26/22 0452  Test: calcium Critical Value: 6.1  Name of Provider Notified: Gladys Damme, NP  Orders Received? Or Actions Taken?:  Albumin and Mag to be drawn by the lab

## 2022-06-26 NOTE — Assessment & Plan Note (Signed)
See Fracture.

## 2022-06-29 DIAGNOSIS — M84469D Pathological fracture, unspecified tibia and fibula, subsequent encounter for fracture with routine healing: Secondary | ICD-10-CM | POA: Diagnosis not present

## 2022-06-29 DIAGNOSIS — N179 Acute kidney failure, unspecified: Secondary | ICD-10-CM | POA: Diagnosis not present

## 2022-06-30 ENCOUNTER — Telehealth: Payer: PPO

## 2022-07-01 ENCOUNTER — Encounter: Payer: Self-pay | Admitting: Orthopedic Surgery

## 2022-07-01 DIAGNOSIS — N179 Acute kidney failure, unspecified: Secondary | ICD-10-CM | POA: Diagnosis not present

## 2022-07-01 DIAGNOSIS — E441 Mild protein-calorie malnutrition: Secondary | ICD-10-CM | POA: Diagnosis not present

## 2022-07-01 DIAGNOSIS — M84469D Pathological fracture, unspecified tibia and fibula, subsequent encounter for fracture with routine healing: Secondary | ICD-10-CM | POA: Diagnosis not present

## 2022-07-06 DIAGNOSIS — I10 Essential (primary) hypertension: Secondary | ICD-10-CM | POA: Diagnosis not present

## 2022-07-06 DIAGNOSIS — M84469D Pathological fracture, unspecified tibia and fibula, subsequent encounter for fracture with routine healing: Secondary | ICD-10-CM | POA: Diagnosis not present

## 2022-07-09 DIAGNOSIS — M84469D Pathological fracture, unspecified tibia and fibula, subsequent encounter for fracture with routine healing: Secondary | ICD-10-CM | POA: Diagnosis not present

## 2022-07-09 DIAGNOSIS — N179 Acute kidney failure, unspecified: Secondary | ICD-10-CM | POA: Diagnosis not present

## 2022-07-13 DIAGNOSIS — I10 Essential (primary) hypertension: Secondary | ICD-10-CM | POA: Diagnosis not present

## 2022-07-13 DIAGNOSIS — M84469D Pathological fracture, unspecified tibia and fibula, subsequent encounter for fracture with routine healing: Secondary | ICD-10-CM | POA: Diagnosis not present

## 2022-07-17 DIAGNOSIS — S82852A Displaced trimalleolar fracture of left lower leg, initial encounter for closed fracture: Secondary | ICD-10-CM | POA: Diagnosis not present

## 2022-07-20 DIAGNOSIS — U071 COVID-19: Secondary | ICD-10-CM | POA: Diagnosis not present

## 2022-07-20 DIAGNOSIS — M84469D Pathological fracture, unspecified tibia and fibula, subsequent encounter for fracture with routine healing: Secondary | ICD-10-CM | POA: Diagnosis not present

## 2022-07-20 DIAGNOSIS — N179 Acute kidney failure, unspecified: Secondary | ICD-10-CM | POA: Diagnosis not present

## 2022-07-30 DIAGNOSIS — R634 Abnormal weight loss: Secondary | ICD-10-CM | POA: Diagnosis not present

## 2022-07-30 DIAGNOSIS — U071 COVID-19: Secondary | ICD-10-CM | POA: Diagnosis not present

## 2022-07-30 DIAGNOSIS — I10 Essential (primary) hypertension: Secondary | ICD-10-CM | POA: Diagnosis not present

## 2022-07-30 DIAGNOSIS — M84469D Pathological fracture, unspecified tibia and fibula, subsequent encounter for fracture with routine healing: Secondary | ICD-10-CM | POA: Diagnosis not present

## 2022-08-11 ENCOUNTER — Telehealth: Payer: PPO

## 2022-08-11 ENCOUNTER — Telehealth: Payer: Self-pay

## 2022-08-11 NOTE — Telephone Encounter (Signed)
   CCM RN Visit Note   08-11-2022 Name: [name] MRN: [id number]      DOB: 29-Jan-1935  Subjective: [name] is a 88 y.o. year old male who is a primary care patient of Dr. Althea Charon.The patient was referred to the Chronic Care Management team for assistance with care management needs subsequent to provider initiation of CCM services and plan of care.      An unsuccessful telephone outreach was attempted today to contact the patient about Chronic Care Management needs.    Plan:The care management team will reach out to the patient again over the next 30 days.  Alto Denver RN, MSN, CCM RN Care Manager  Chronic Care Management Direct Number: [phone]

## 2022-08-21 DIAGNOSIS — S82852A Displaced trimalleolar fracture of left lower leg, initial encounter for closed fracture: Secondary | ICD-10-CM | POA: Diagnosis not present

## 2022-08-24 DIAGNOSIS — M84469D Pathological fracture, unspecified tibia and fibula, subsequent encounter for fracture with routine healing: Secondary | ICD-10-CM | POA: Diagnosis not present

## 2022-08-26 DIAGNOSIS — M84469D Pathological fracture, unspecified tibia and fibula, subsequent encounter for fracture with routine healing: Secondary | ICD-10-CM | POA: Diagnosis not present

## 2022-08-28 DIAGNOSIS — U071 COVID-19: Secondary | ICD-10-CM | POA: Diagnosis not present

## 2022-08-28 DIAGNOSIS — I639 Cerebral infarction, unspecified: Secondary | ICD-10-CM | POA: Diagnosis not present

## 2022-08-28 DIAGNOSIS — M84469D Pathological fracture, unspecified tibia and fibula, subsequent encounter for fracture with routine healing: Secondary | ICD-10-CM | POA: Diagnosis not present

## 2022-08-28 DIAGNOSIS — I10 Essential (primary) hypertension: Secondary | ICD-10-CM | POA: Diagnosis not present

## 2022-09-02 ENCOUNTER — Telehealth: Payer: Self-pay | Admitting: Family Medicine

## 2022-09-02 ENCOUNTER — Ambulatory Visit (INDEPENDENT_AMBULATORY_CARE_PROVIDER_SITE_OTHER): Payer: PPO | Admitting: Family Medicine

## 2022-09-02 ENCOUNTER — Encounter: Payer: Self-pay | Admitting: Family Medicine

## 2022-09-02 VITALS — BP 112/62 | HR 79 | Ht 70.0 in | Wt 225.0 lb

## 2022-09-02 DIAGNOSIS — S82202D Unspecified fracture of shaft of left tibia, subsequent encounter for closed fracture with routine healing: Secondary | ICD-10-CM

## 2022-09-02 DIAGNOSIS — I6930 Unspecified sequelae of cerebral infarction: Secondary | ICD-10-CM

## 2022-09-02 DIAGNOSIS — N183 Hypertensive chronic kidney disease with stage 1 through stage 4 chronic kidney disease, or unspecified chronic kidney disease: Secondary | ICD-10-CM

## 2022-09-02 DIAGNOSIS — E782 Mixed hyperlipidemia: Secondary | ICD-10-CM | POA: Diagnosis not present

## 2022-09-02 DIAGNOSIS — N401 Enlarged prostate with lower urinary tract symptoms: Secondary | ICD-10-CM

## 2022-09-02 DIAGNOSIS — I129 Hypertensive chronic kidney disease with stage 1 through stage 4 chronic kidney disease, or unspecified chronic kidney disease: Secondary | ICD-10-CM | POA: Diagnosis not present

## 2022-09-02 DIAGNOSIS — N138 Other obstructive and reflux uropathy: Secondary | ICD-10-CM

## 2022-09-02 MED ORDER — ATORVASTATIN CALCIUM 40 MG PO TABS: 40.0000 mg | ORAL_TABLET | Freq: Every day | ORAL | 1 refills | Status: DC

## 2022-09-02 MED ORDER — LISINOPRIL 10 MG PO TABS: 10.0000 mg | ORAL_TABLET | Freq: Every day | ORAL | 1 refills | Status: DC

## 2022-09-02 MED ORDER — CLOPIDOGREL BISULFATE 75 MG PO TABS: 75.0000 mg | ORAL_TABLET | Freq: Every day | ORAL | 1 refills | Status: DC

## 2022-09-02 MED ORDER — FINASTERIDE 5 MG PO TABS: 5.0000 mg | ORAL_TABLET | Freq: Every day | ORAL | 1 refills | Status: DC

## 2022-09-02 MED ORDER — POLYETHYLENE GLYCOL 3350 17 G PO PACK: 17.0000 g | PACK | Freq: Every day | ORAL | 0 refills | Status: AC | PRN

## 2022-09-02 NOTE — Telephone Encounter (Signed)
Diwanna from [facility] will begin Skilled Nursing Thursday 09/03/2022  Best contact: [phone] ext. [phone]

## 2022-09-02 NOTE — Patient Instructions (Addendum)
Thank you for coming to the office today.  Diwanna from [facility] will begin Skilled Nursing Thursday 09/03/2022   Best contact: [phone]  -------------------------------  Alto [place] will contact you to coordinate medications and also help with the Life Alert.  ------------------------------   All medicines have been ordered.  New med list  DAILY MORNING Medications Lisinopril 10mg  daily Clopidogrel 75mg  daily Finasteride 5mg  daily  DAILY EVENING Medications Atorvastatin 40mg  nightly  DAILY Supplements OTC Aspirin baby 81mg  daily Fish Oil omega 3, 1200 daily  As needed can use the following Miralax powder for constipation 1-2 doses per day as needed.  Discontinue - SMZ/TMP-DS antibiotic large white pill twice a day, 2 more days left then complete - Updated the med list  Please schedule a Follow-up Appointment to: Return in about 4 weeks (around 09/30/2022) for 4 weeks follow-up med , PT, updates.  If you have any other questions or concerns, please feel free to call the office or send a message through MyChart. You may also schedule an earlier appointment if necessary.  Additionally, you may be receiving a survey about your experience at our office within a few days to 1 week by e-mail or mail. We value your feedback.  Saralyn Pilar, DO [facility], [place]

## 2022-09-02 NOTE — Progress Notes (Signed)
Subjective:    Patient ID: [name], male    DOB: 20-Jun-1934, 88 y.o.   MRN: [id number]  [name] is a 88 y.o. male presenting on 09/02/2022 for Medical Management of Chronic Issues   HPI  HOSPITAL FOLLOW-UP VISIT  Hospital/Location: [facility] Date of Admission: 06/22/22 Date of Discharge: 06/26/22 Transitions of care telephone call: n/a  Reason for Admission: fall, fracture of L tib/fib  - Hospital H&P and Discharge Summary have been reviewed - Patient presents today 2 months after recent hospitalization. Brief summary of recent course, patient had symptoms of fall injury L leg fracture  He was discharged to SNF [facility].  Update today from [facility] > initiating PT tomorrow, called already and they will initiate on Thursday 5/16  They need assistance with med rec and he has limited meds left on pill pack. After leaving SNF  PRN Meds Bactrim-DS 08/22/22 > 08/31/22, last 2 days remaining, has 4 pills left over despite date already passed Miralax powder as needed for constipation Off Tramadol, no pain and not needed.  Also takes MVI and Fish Oil 1200 mg  AM medications Lisinopril 10mg  daily Clopidogrel 75mg  daily Finasteride 5mg  daily  PM medications Atorvastatin 40mg  nightly  - Today reports overall has done well after discharge. Symptoms of Left leg pain have improved. He has walking boot today, in wheelchair, awaiting PT to start tomorrow  - New medications on discharge: see list - Changes to current meds on discharge: see list  I have reviewed the discharge medication list, and have reconciled the current and discharge medications today.   Current Outpatient Medications:    aspirin EC 81 MG tablet, Take 81 mg by mouth daily. Swallow whole., Disp: , Rfl:    Multiple Vitamins-Minerals (MULTIVITAMIN ADULTS 50+ PO), Take by mouth., Disp: , Rfl:    Omega-3 Fatty Acids (FISH OIL) 1200 MG CAPS, Take by mouth daily at 6 (six) AM., Disp: , Rfl:    acetaminophen  (TYLENOL) 325 MG tablet, Take 2 tablets (650 mg total) by mouth every 6 (six) hours as needed for mild pain, fever or headache (or Fever >/= 101)., Disp: , Rfl:    atorvastatin (LIPITOR) 40 MG tablet, Take 1 tablet (40 mg total) by mouth daily., Disp: 90 tablet, Rfl: 1   clopidogrel (PLAVIX) 75 MG tablet, Take 1 tablet (75 mg total) by mouth daily., Disp: 90 tablet, Rfl: 1   finasteride (PROSCAR) 5 MG tablet, Take 1 tablet (5 mg total) by mouth daily., Disp: 90 tablet, Rfl: 1   lisinopril (ZESTRIL) 10 MG tablet, Take 1 tablet (10 mg total) by mouth daily., Disp: 90 tablet, Rfl: 1   polyethylene glycol (MIRALAX / GLYCOLAX) 17 g packet, Take 17 g by mouth daily as needed for mild constipation., Disp: 14 each, Rfl: 0  ------------------------------------------------------------------------- Social History   Tobacco Use   Smoking status: Former    Types: Cigarettes    Quit date: 1985    Years since quitting: 39.3    Passive exposure: Past   Smokeless tobacco: Former  Building services engineer Use: Never used  Substance Use Topics   Alcohol use: No   Drug use: No    Review of Systems Per HPI unless specifically indicated above     Objective:    BP 112/62   Pulse 79   Ht 5\' 10"  (1.778 m)   Wt 225 lb (102.1 kg)   SpO2 98%   BMI 32.28 kg/m   Wt  Readings from Last 3 Encounters:  09/02/22 225 lb (102.1 kg)  06/23/22 225 lb (102.1 kg)  05/11/22 235 lb (106.6 kg)    Physical Exam Vitals and nursing note reviewed.  Constitutional:      General: He is not in acute distress.    Appearance: He is well-developed. He is not diaphoretic.     Comments: Well-appearing, comfortable, cooperative  HENT:     Head: Normocephalic and atraumatic.  Eyes:     General:        Right eye: No discharge.        Left eye: No discharge.     Conjunctiva/sclera: Conjunctivae normal.  Neck:     Thyroid: No thyromegaly.  Cardiovascular:     Rate and Rhythm: Normal rate and regular rhythm.     Pulses:  Normal pulses.     Heart sounds: Normal heart sounds. No murmur heard. Pulmonary:     Effort: Pulmonary effort is normal. No respiratory distress.     Breath sounds: Normal breath sounds. No wheezing or rales.  Musculoskeletal:     Cervical back: Normal range of motion and neck supple.     Comments: Seated in wheelchair, non weight bearing, boot L leg  Lymphadenopathy:     Cervical: No cervical adenopathy.  Skin:    General: Skin is warm and dry.     Findings: No erythema or rash.  Neurological:     Mental Status: He is alert and oriented to person, place, and time. Mental status is at baseline.  Psychiatric:        Behavior: Behavior normal.     Comments: Well groomed, good eye contact, normal speech and thoughts. He is at his mental status baseline. He cooperates with conversation. Some issues with recall but his comprehension is intact     Results for orders placed or performed during the hospital encounter of 06/22/22  CBC with Differential  Result Value Ref Range   WBC 10.0 4.0 - 10.5 K/uL   RBC 3.71 (L) 4.22 - 5.81 MIL/uL   Hemoglobin 12.1 (L) 13.0 - 17.0 g/dL   HCT 16.1 (L) 09.6 - 04.5 %   MCV 99.7 80.0 - 100.0 fL   MCH 32.6 26.0 - 34.0 pg   MCHC 32.7 30.0 - 36.0 g/dL   RDW 40.9 81.1 - 91.4 %   Platelets 155 150 - 400 K/uL   nRBC 0.0 0.0 - 0.2 %   Neutrophils Relative % 62 %   Neutro Abs 6.2 1.7 - 7.7 K/uL   Lymphocytes Relative 23 %   Lymphs Abs 2.3 0.7 - 4.0 K/uL   Monocytes Relative 8 %   Monocytes Absolute 0.8 0.1 - 1.0 K/uL   Eosinophils Relative 6 %   Eosinophils Absolute 0.6 (H) 0.0 - 0.5 K/uL   Basophils Relative 1 %   Basophils Absolute 0.1 0.0 - 0.1 K/uL   Immature Granulocytes 0 %   Abs Immature Granulocytes 0.03 0.00 - 0.07 K/uL  Comprehensive metabolic panel  Result Value Ref Range   Sodium 138 135 - 145 mmol/L   Potassium 4.2 3.5 - 5.1 mmol/L   Chloride 103 98 - 111 mmol/L   CO2 27 22 - 32 mmol/L   Glucose, Bld 96 70 - 99 mg/dL   BUN 25 (H) 8  - 23 mg/dL   Creatinine, Ser 7.82 0.61 - 1.24 mg/dL   Calcium 8.7 (L) 8.9 - 10.3 mg/dL   Total Protein 6.9 6.5 - 8.1 g/dL   Albumin  3.8 3.5 - 5.0 g/dL   AST 20 15 - 41 U/L   ALT 16 0 - 44 U/L   Alkaline Phosphatase 61 38 - 126 U/L   Total Bilirubin 0.5 0.3 - 1.2 mg/dL   GFR, Estimated >08 >65 mL/min   Anion gap 8 5 - 15  Basic metabolic panel  Result Value Ref Range   Sodium 136 135 - 145 mmol/L   Potassium 4.1 3.5 - 5.1 mmol/L   Chloride 103 98 - 111 mmol/L   CO2 27 22 - 32 mmol/L   Glucose, Bld 96 70 - 99 mg/dL   BUN 24 (H) 8 - 23 mg/dL   Creatinine, Ser 7.84 0.61 - 1.24 mg/dL   Calcium 8.2 (L) 8.9 - 10.3 mg/dL   GFR, Estimated >69 >62 mL/min   Anion gap 6 5 - 15  CBC  Result Value Ref Range   WBC 7.9 4.0 - 10.5 K/uL   RBC 3.53 (L) 4.22 - 5.81 MIL/uL   Hemoglobin 11.5 (L) 13.0 - 17.0 g/dL   HCT 95.2 (L) 84.1 - 32.4 %   MCV 97.2 80.0 - 100.0 fL   MCH 32.6 26.0 - 34.0 pg   MCHC 33.5 30.0 - 36.0 g/dL   RDW 40.1 02.7 - 25.3 %   Platelets 142 (L) 150 - 400 K/uL   nRBC 0.0 0.0 - 0.2 %  CBC  Result Value Ref Range   WBC 12.8 (H) 4.0 - 10.5 K/uL   RBC 3.57 (L) 4.22 - 5.81 MIL/uL   Hemoglobin 11.6 (L) 13.0 - 17.0 g/dL   HCT 66.4 (L) 40.3 - 47.4 %   MCV 98.3 80.0 - 100.0 fL   MCH 32.5 26.0 - 34.0 pg   MCHC 33.0 30.0 - 36.0 g/dL   RDW 25.9 56.3 - 87.5 %   Platelets 154 150 - 400 K/uL   nRBC 0.0 0.0 - 0.2 %  Basic metabolic panel  Result Value Ref Range   Sodium 136 135 - 145 mmol/L   Potassium 3.8 3.5 - 5.1 mmol/L   Chloride 103 98 - 111 mmol/L   CO2 27 22 - 32 mmol/L   Glucose, Bld 146 (H) 70 - 99 mg/dL   BUN 28 (H) 8 - 23 mg/dL   Creatinine, Ser 6.43 0.61 - 1.24 mg/dL   Calcium 8.1 (L) 8.9 - 10.3 mg/dL   GFR, Estimated 58 (L) >60 mL/min   Anion gap 6 5 - 15  Basic metabolic panel  Result Value Ref Range   Sodium 138 135 - 145 mmol/L   Potassium 4.0 3.5 - 5.1 mmol/L   Chloride 105 98 - 111 mmol/L   CO2 26 22 - 32 mmol/L   Glucose, Bld 123 (H) 70 - 99 mg/dL    BUN 36 (H) 8 - 23 mg/dL   Creatinine, Ser 3.29 (H) 0.61 - 1.24 mg/dL   Calcium 7.7 (L) 8.9 - 10.3 mg/dL   GFR, Estimated 49 (L) >60 mL/min   Anion gap 7 5 - 15  CBC  Result Value Ref Range   WBC 13.0 (H) 4.0 - 10.5 K/uL   RBC 3.15 (L) 4.22 - 5.81 MIL/uL   Hemoglobin 10.4 (L) 13.0 - 17.0 g/dL   HCT 51.8 (L) 84.1 - 66.0 %   MCV 99.0 80.0 - 100.0 fL   MCH 33.0 26.0 - 34.0 pg   MCHC 33.3 30.0 - 36.0 g/dL   RDW 63.0 16.0 - 10.9 %   Platelets 147 (L) 150 -  400 K/uL   nRBC 0.0 0.0 - 0.2 %  CBC  Result Value Ref Range   WBC 9.1 4.0 - 10.5 K/uL   RBC 2.82 (L) 4.22 - 5.81 MIL/uL   Hemoglobin 9.3 (L) 13.0 - 17.0 g/dL   HCT 16.1 (L) 09.6 - 04.5 %   MCV 98.6 80.0 - 100.0 fL   MCH 33.0 26.0 - 34.0 pg   MCHC 33.5 30.0 - 36.0 g/dL   RDW 40.9 81.1 - 91.4 %   Platelets 137 (L) 150 - 400 K/uL   nRBC 0.0 0.0 - 0.2 %  Basic metabolic panel  Result Value Ref Range   Sodium 137 135 - 145 mmol/L   Potassium 3.1 (L) 3.5 - 5.1 mmol/L   Chloride 114 (H) 98 - 111 mmol/L   CO2 21 (L) 22 - 32 mmol/L   Glucose, Bld 92 70 - 99 mg/dL   BUN 25 (H) 8 - 23 mg/dL   Creatinine, Ser 7.82 0.61 - 1.24 mg/dL   Calcium 6.1 (LL) 8.9 - 10.3 mg/dL   GFR, Estimated >95 >62 mL/min   Anion gap 2 (L) 5 - 15  Albumin  Result Value Ref Range   Albumin 2.2 (L) 3.5 - 5.0 g/dL  Magnesium  Result Value Ref Range   Magnesium 1.5 (L) 1.7 - 2.4 mg/dL  Basic metabolic panel  Result Value Ref Range   Sodium 136 135 - 145 mmol/L   Potassium 4.4 3.5 - 5.1 mmol/L   Chloride 104 98 - 111 mmol/L   CO2 25 22 - 32 mmol/L   Glucose, Bld 123 (H) 70 - 99 mg/dL   BUN 26 (H) 8 - 23 mg/dL   Creatinine, Ser 1.30 0.61 - 1.24 mg/dL   Calcium 8.1 (L) 8.9 - 10.3 mg/dL   GFR, Estimated >86 >57 mL/min   Anion gap 7 5 - 15  Magnesium  Result Value Ref Range   Magnesium 2.8 (H) 1.7 - 2.4 mg/dL      Assessment & Plan:   Problem List Items Addressed This Visit     Benign hypertension with CKD (chronic kidney disease) stage III (HCC)  - Primary   Relevant Medications   aspirin EC 81 MG tablet   atorvastatin (LIPITOR) 40 MG tablet   lisinopril (ZESTRIL) 10 MG tablet   BPH with obstruction/lower urinary tract symptoms   Relevant Medications   finasteride (PROSCAR) 5 MG tablet   Fracture of left tibia and fibula, closed, initial encounter   History of cerebrovascular accident (CVA) with residual deficit   Relevant Medications   clopidogrel (PLAVIX) 75 MG tablet   HLD (hyperlipidemia)   Relevant Medications   aspirin EC 81 MG tablet   atorvastatin (LIPITOR) 40 MG tablet   lisinopril (ZESTRIL) 10 MG tablet    Reviewed hospitalization and SNF stay  Proceed w/ Home Health PT as scheduled starting Tomorrow Thurs 5/16  All medicines have been re ordered.  New med list  DAILY MORNING Medications Lisinopril 10mg  daily Clopidogrel 75mg  daily Finasteride 5mg  daily  DAILY EVENING Medications Atorvastatin 40mg  nightly  DAILY Supplements OTC Aspirin baby 81mg  daily Fish Oil omega 3, 1200 daily  As needed can use the following Miralax powder for constipation 1-2 doses per day as needed.  Discontinue - SMZ/TMP-DS antibiotic large white pill twice a day, 2 more days left then complete - Updated the med list   Meds ordered this encounter  Medications   polyethylene glycol (MIRALAX / GLYCOLAX) 17 g packet  Sig: Take 17 g by mouth daily as needed for mild constipation.    Dispense:  14 each    Refill:  0   atorvastatin (LIPITOR) 40 MG tablet    Sig: Take 1 tablet (40 mg total) by mouth daily.    Dispense:  90 tablet    Refill:  1   finasteride (PROSCAR) 5 MG tablet    Sig: Take 1 tablet (5 mg total) by mouth daily.    Dispense:  90 tablet    Refill:  1   lisinopril (ZESTRIL) 10 MG tablet    Sig: Take 1 tablet (10 mg total) by mouth daily.    Dispense:  90 tablet    Refill:  1   clopidogrel (PLAVIX) 75 MG tablet    Sig: Take 1 tablet (75 mg total) by mouth daily.    Dispense:  90 tablet    Refill:  1     Follow up plan: Return in about 4 weeks (around 09/30/2022) for 4 weeks follow-up med , PT, updates.  Forward chart to [facility], reconnect, talk with Cheryle Horsfall [phone] about medications, Life Alert order    Saralyn Pilar, DO [facility] 09/02/2022, 2:48 PM

## 2022-09-03 ENCOUNTER — Ambulatory Visit (INDEPENDENT_AMBULATORY_CARE_PROVIDER_SITE_OTHER): Payer: PPO

## 2022-09-03 ENCOUNTER — Telehealth: Payer: Self-pay | Admitting: Family Medicine

## 2022-09-03 DIAGNOSIS — M84462D Pathological fracture, left tibia, subsequent encounter for fracture with routine healing: Secondary | ICD-10-CM | POA: Diagnosis not present

## 2022-09-03 DIAGNOSIS — I6930 Unspecified sequelae of cerebral infarction: Secondary | ICD-10-CM

## 2022-09-03 DIAGNOSIS — Z9181 History of falling: Secondary | ICD-10-CM | POA: Diagnosis not present

## 2022-09-03 DIAGNOSIS — R413 Other amnesia: Secondary | ICD-10-CM

## 2022-09-03 DIAGNOSIS — Z7982 Long term (current) use of aspirin: Secondary | ICD-10-CM | POA: Diagnosis not present

## 2022-09-03 DIAGNOSIS — I129 Hypertensive chronic kidney disease with stage 1 through stage 4 chronic kidney disease, or unspecified chronic kidney disease: Secondary | ICD-10-CM

## 2022-09-03 DIAGNOSIS — Z87891 Personal history of nicotine dependence: Secondary | ICD-10-CM | POA: Diagnosis not present

## 2022-09-03 DIAGNOSIS — M84464D Pathological fracture, left fibula, subsequent encounter for fracture with routine healing: Secondary | ICD-10-CM | POA: Diagnosis not present

## 2022-09-03 DIAGNOSIS — N400 Enlarged prostate without lower urinary tract symptoms: Secondary | ICD-10-CM | POA: Diagnosis not present

## 2022-09-03 DIAGNOSIS — I10 Essential (primary) hypertension: Secondary | ICD-10-CM | POA: Diagnosis not present

## 2022-09-03 DIAGNOSIS — E7800 Pure hypercholesterolemia, unspecified: Secondary | ICD-10-CM | POA: Diagnosis not present

## 2022-09-03 DIAGNOSIS — Z7902 Long term (current) use of antithrombotics/antiplatelets: Secondary | ICD-10-CM | POA: Diagnosis not present

## 2022-09-03 NOTE — Telephone Encounter (Signed)
Home Health Verbal Orders - Caller/Agency: [facility]  Callback Number: [phone] Requesting OT/PT/Skilled Nursing/Social Work/Speech Therapy: Nursing, PT, and OT Frequency:   1 w3 Nursing  (PT and OT will call to update their frequencies)   Wants to know if PCP will sign off on orders

## 2022-09-03 NOTE — Telephone Encounter (Signed)
Okay to proceed with these orders. Yes I can sign off. Thank you  Saralyn Pilar, DO [facility] 09/03/2022, 1:28 PM

## 2022-09-03 NOTE — Patient Instructions (Signed)
Please call the care guide team at [phone] if you need to cancel or reschedule your appointment.   If you are experiencing a Mental Health or Behavioral Health Crisis or need someone to talk to, please call the Suicide and Crisis Lifeline: 988 call the [place] National Suicide Prevention Lifeline: 810-870-4760 or TTY: (801) 845-8863 TTY 360-786-7732) to talk to a trained counselor call 1-800-273-TALK (toll free, 24 hour hotline)   Following is a copy of the CCM Program Consent:  CCM service includes personalized support from designated clinical staff supervised by the physician, including individualized plan of care and coordination with other care providers 24/7 contact phone numbers for assistance for urgent and routine care needs. Service will only be billed when office clinical staff spend 20 minutes or more in a month to coordinate care. Only one practitioner may furnish and bill the service in a calendar month. The patient may stop CCM services at amy time (effective at the end of the month) by phone call to the office staff. The patient will be responsible for cost sharing (co-pay) or up to 20% of the service fee (after annual deductible is met)  Following is a copy of your full provider care plan:   Goals Addressed             This Visit's Progress    CCM Expected Outcome:  Monitor, Self-Manage and Reduce Symptoms of [place] Ischemic/TIA       Current Barriers:  Knowledge Deficits related to memory loss and following the plan of care related to past CVA/TIA Care Coordination needs related to ongoing support and education needs  in a patient with history of CVA with memory loss. Chronic Disease Management support and education needs related to effective management of post CVA with memory loss  Lacks caregiver support.  Non-adherence to scheduled provider appointments Non-adherence to prescribed medication regimen Cognitive Deficits  Planned Interventions: Evaluation of  current treatment plan related to effective management of past history of CVA and TIA with memory loss, especially short term memory loss and patient's adherence to plan as established by provider. The patient denies any acute changes. The patient can recall lots of information but short term memory is not good. The patient is home and doing well after hospital stay for broken ankle and rehab. The patient has home health coming out to the home and they were there today. His wife is still in the hospital and her daughter is trying to get guardianship of the wife. They have to go to court on Monday, May 20th. [name] states it is a sad situation as Delontay wants to bring her home. Reflective listening and support. [name] given information on how to get a life alert system and gave him the number to the [place] area. He also has the number to reach the [facility]. Education and support.  Advised patient to call the office for changes in mood, anxiety, depression, new questions and concerns related to past history of stroke with memory loss Provided education to patient re: calling for changes in his condition or new concerns related to his health and well being Reviewed medications with patient and discussed compliance. The patient is compliant with his medications. Praised the patient for taking his medications as directed. The patient has admitted to not taking medications as directed in the past. Education provided on the benefits of taking medications to help the patient maintain his health and well being.  Provided patient with doing crossword puzzles, memory recall activities and creating routines  educational materials related to history of CVA with memory loss and effective management of deficits related to memory loss. Reviewed scheduled/upcoming provider appointments including 09-29-2021 at 11 am. Reminder provided today Discussed plans with patient for ongoing care management follow up and provided patient  with direct contact information for care management team Advised patient to discuss changes in his memory  with provider  Symptom Management: Take medications as prescribed   Attend all scheduled provider appointments Perform all self care activities independently  Call provider office for new concerns or questions  call the Suicide and Crisis Lifeline: 988 call the [place] National Suicide Prevention Lifeline: (519) 374-7784 or TTY: 320-727-1365 TTY 254-505-7697) to talk to a trained counselor call 1-800-273-TALK (toll free, 24 hour hotline) if experiencing a Mental Health or Behavioral Health Crisis   Follow Up Plan: Telephone follow up appointment with care management team member scheduled for: 09-24-2022 at 230 pm        CCM Expected Outcome:  Monitor, Self-Manage, and Reduce Symptoms of Hypertension       Current Barriers:  Knowledge Deficits related to effective management of HTN Care Coordination needs related to ongoing support and education needs  in a patient with HTN Chronic Disease Management support and education needs related to effective management of HTN Lacks caregiver support.  Non-adherence to scheduled provider appointments Non-adherence to prescribed medication regimen Cognitive Deficits  BP Readings from Last 3 Encounters:  09/02/22 112/62  06/26/22 135/63  05/11/22 120/70     Planned Interventions: Evaluation of current treatment plan related to hypertension self management and patient's adherence to plan as established by provider. The patient has been discharged from [facility] after a hospital stay and rehab due to a broken ankle. Per his friend [name] he is home and doing well. Education provided and [name] is assisting with his needs at this time.   Provided education to patient re: stroke prevention, s/s of heart attack and stroke. Reviewed with the patient; Reviewed prescribed diet heart healthy diet. Education. The patient states he is eating well.  He says sometimes he eats "too well". Education on monitoring for hidden sodium in his diet. [name] states he has food and denies any new concerns related to dietary habits. Reviewed medications with patient and discussed importance of compliance. The patient states compliance with his medications. [name] is assisting with his medications and making sure they are in pill boxes. All of his medications got thrown away while he was in rehab by his step-daughter. His wife is currently in the hospital. [name] is helping the patient with his medications at this time and is monitoring him taking them.  Discussed plans with patient for ongoing care management follow up and provided patient with direct contact information for care management team; Advised patient, providing education and rationale, to monitor blood pressure daily and record, calling PCP for findings outside established parameters. The patient does not take his blood pressures at home. Review of the benefit of blood pressure checks;  Reviewed scheduled/upcoming provider appointments including: 09-30-2022 at 11 am. [name] has been taking him to his appointments.  Advised patient to discuss changes in blood pressures or heart health with provider; Provided education on prescribed diet heart healthy. Review of monitoring sodium intake, the patient knows not to add salt to his dishes.;  Discussed complications of poorly controlled blood pressure such as heart disease, stroke, circulatory complications, vision complications, kidney impairment, sexual dysfunction;  Screening for signs and symptoms of depression related to chronic disease state;  Assessed social determinant of health barriers;   Symptom Management: Take medications as prescribed   Attend all scheduled provider appointments Call pharmacy for medication refills 3-7 days in advance of running out of medications Call provider office for new concerns or questions  Work with the social  worker to address care coordination needs and will continue to work with the clinical team to address health care and disease management related needs call the Suicide and Crisis Lifeline: 988 call the [place] National Suicide Prevention Lifeline: 828 156 5572 or TTY: 7011877040 TTY (506)227-2867) to talk to a trained counselor call 1-800-273-TALK (toll free, 24 hour hotline) if experiencing a Mental Health or Behavioral Health Crisis  learn about high blood pressure call doctor for signs and symptoms of high blood pressure develop an action plan for high blood pressure keep all doctor appointments take medications for blood pressure exactly as prescribed report new symptoms to your doctor eat more whole grains, fruits and vegetables, lean meats and healthy fats  Follow Up Plan: Telephone follow up appointment with care management team member scheduled for: 09-24-2022 at 230 pm          Patient verbalizes understanding of instructions and care plan provided today and agrees to view in MyChart. Active MyChart status and patient understanding of how to access instructions and care plan via MyChart confirmed with patient.  Telephone follow up appointment with care management team member scheduled for: 09-24-2022 at 230 pm

## 2022-09-03 NOTE — Telephone Encounter (Signed)
Left message that is was ok to proceed with order.

## 2022-09-03 NOTE — Chronic Care Management (AMB) (Signed)
Chronic Care Management   CCM RN Visit Note  09/03/2022 Name: [name] MRN: [id number] DOB: Jun 17, 1934  Subjective: [name] is a 88 y.o. year old male who is a primary care patient of Jonathan Cords, DO. The patient was referred to the Chronic Care Management team for assistance with care management needs subsequent to provider initiation of CCM services and plan of care.    Today's Visit:   Spoke to [name] the patients friend and DPR  for follow up visit.        Goals Addressed             This Visit's Progress    CCM Expected Outcome:  Monitor, Self-Manage and Reduce Symptoms of Stoke Ischemic/TIA       Current Barriers:  Knowledge Deficits related to memory loss and following the plan of care related to past CVA/TIA Care Coordination needs related to ongoing support and education needs  in a patient with history of CVA with memory loss. Chronic Disease Management support and education needs related to effective management of post CVA with memory loss  Lacks caregiver support.  Non-adherence to scheduled provider appointments Non-adherence to prescribed medication regimen Cognitive Deficits  Planned Interventions: Evaluation of current treatment plan related to effective management of past history of CVA and TIA with memory loss, especially short term memory loss and patient's adherence to plan as established by provider. The patient denies any acute changes. The patient can recall lots of information but short term memory is not good. The patient is home and doing well after hospital stay for broken ankle and rehab. The patient has home health coming out to the home and they were there today. His wife is still in the hospital and her daughter is trying to get guardianship of the wife. They have to go to court on Monday, May 20th. [name] states it is a sad situation as [name] wants to bring her home. Reflective listening and support. [name] given  information on how to get a life alert system and gave him the number to the [place] area. He also has the number to reach the [facility]. Education and support.  Advised patient to call the office for changes in mood, anxiety, depression, new questions and concerns related to past history of stroke with memory loss Provided education to patient re: calling for changes in his condition or new concerns related to his health and well being Reviewed medications with patient and discussed compliance. The patient is compliant with his medications. Praised the patient for taking his medications as directed. The patient has admitted to not taking medications as directed in the past. Education provided on the benefits of taking medications to help the patient maintain his health and well being.  Provided patient with doing crossword puzzles, memory recall activities and creating routines  educational materials related to history of CVA with memory loss and effective management of deficits related to memory loss. Reviewed scheduled/upcoming provider appointments including 09-29-2021 at 11 am. Reminder provided today Discussed plans with patient for ongoing care management follow up and provided patient with direct contact information for care management team Advised patient to discuss changes in his memory  with provider  Symptom Management: Take medications as prescribed   Attend all scheduled provider appointments Perform all self care activities independently  Call provider office for new concerns or questions  call the Suicide and Crisis Lifeline: 988 call the [place] National Suicide Prevention Lifeline: (770)777-4498 or TTY: (803)210-7700  TTY 323-344-5697) to talk to a trained counselor call 1-800-273-TALK (toll free, 24 hour hotline) if experiencing a Mental Health or Behavioral Health Crisis   Follow Up Plan: Telephone follow up appointment with care management team member scheduled for: 09-24-2022 at 230  pm        CCM Expected Outcome:  Monitor, Self-Manage, and Reduce Symptoms of Hypertension       Current Barriers:  Knowledge Deficits related to effective management of HTN Care Coordination needs related to ongoing support and education needs  in a patient with HTN Chronic Disease Management support and education needs related to effective management of HTN Lacks caregiver support.  Non-adherence to scheduled provider appointments Non-adherence to prescribed medication regimen Cognitive Deficits  BP Readings from Last 3 Encounters:  09/02/22 112/62  06/26/22 135/63  05/11/22 120/70     Planned Interventions: Evaluation of current treatment plan related to hypertension self management and patient's adherence to plan as established by provider. The patient has been discharged from [facility] after a hospital stay and rehab due to a broken ankle. Per his friend [name] he is home and doing well. Education provided and [name] is assisting with his needs at this time.   Provided education to patient re: stroke prevention, s/s of heart attack and stroke. Reviewed with the patient; Reviewed prescribed diet heart healthy diet. Education. The patient states he is eating well. He says sometimes he eats "too well". Education on monitoring for hidden sodium in his diet. [name] states he has food and denies any new concerns related to dietary habits. Reviewed medications with patient and discussed importance of compliance. The patient states compliance with his medications. [name] is assisting with his medications and making sure they are in pill boxes. All of his medications got thrown away while he was in rehab by his step-daughter. His wife is currently in the hospital. [name] is helping the patient with his medications at this time and is monitoring him taking them.  Discussed plans with patient for ongoing care management follow up and provided patient with direct contact information for  care management team; Advised patient, providing education and rationale, to monitor blood pressure daily and record, calling PCP for findings outside established parameters. The patient does not take his blood pressures at home. Review of the benefit of blood pressure checks;  Reviewed scheduled/upcoming provider appointments including: 09-30-2022 at 11 am. [name] has been taking him to his appointments.  Advised patient to discuss changes in blood pressures or heart health with provider; Provided education on prescribed diet heart healthy. Review of monitoring sodium intake, the patient knows not to add salt to his dishes.;  Discussed complications of poorly controlled blood pressure such as heart disease, stroke, circulatory complications, vision complications, kidney impairment, sexual dysfunction;  Screening for signs and symptoms of depression related to chronic disease state;  Assessed social determinant of health barriers;   Symptom Management: Take medications as prescribed   Attend all scheduled provider appointments Call pharmacy for medication refills 3-7 days in advance of running out of medications Call provider office for new concerns or questions  Work with the social worker to address care coordination needs and will continue to work with the clinical team to address health care and disease management related needs call the Suicide and Crisis Lifeline: 988 call the [place] National Suicide Prevention Lifeline: (908)886-2748 or TTY: 7036376493 TTY 872-357-8310) to talk to a trained counselor call 1-800-273-TALK (toll free, 24 hour hotline) if experiencing a Mental Health or  Behavioral Health Crisis  learn about high blood pressure call doctor for signs and symptoms of high blood pressure develop an action plan for high blood pressure keep all doctor appointments take medications for blood pressure exactly as prescribed report new symptoms to your doctor eat more whole grains,  fruits and vegetables, lean meats and healthy fats  Follow Up Plan: Telephone follow up appointment with care management team member scheduled for: 09-24-2022 at 230 pm          Plan:Telephone follow up appointment with care management team member scheduled for:  09-24-2022 at 230 pm  Alto Denver RN, MSN, CCM RN Care Manager  Chronic Care Management Direct Number: [phone]

## 2022-09-04 ENCOUNTER — Telehealth: Payer: Self-pay | Admitting: Family Medicine

## 2022-09-04 NOTE — Telephone Encounter (Signed)
Home Health Verbal Orders - Caller/Agency: Judeth Cornfield with [facility] Callback Number: [phone]  Requesting OT Frequency: 1 x wk for 6 wks  Please assist further

## 2022-09-04 NOTE — Telephone Encounter (Signed)
Okay to proceed with verbal orders.  Also regarding weight bearing status.  He may advance to weight bearing up as tolerated.  He was instructed on 06/26/22 to maintain Non-weightbearing on LEFT lower extremity x 6-8 weeks.  Now it has been about 9+ weeks. If he can tolerate weight bearing, it is fine to progress to this.  Otherwise if he cannot, he will need to return to Dr Martha Clan at [facility] for follow-up on the fracture healing and new weight bearing instructions.  Saralyn Pilar, DO [facility] 09/04/2022, 5:03 PM

## 2022-09-04 NOTE — Telephone Encounter (Signed)
Soni calling [facility] is calling to request PT 1 W 7  Needing clarification on the weight bearing status for this patient. Please advise  CB- [phone] Verbal ok on VM

## 2022-09-07 NOTE — Telephone Encounter (Signed)
Information given to therapist.

## 2022-09-09 DIAGNOSIS — Z7902 Long term (current) use of antithrombotics/antiplatelets: Secondary | ICD-10-CM | POA: Diagnosis not present

## 2022-09-09 DIAGNOSIS — M84464D Pathological fracture, left fibula, subsequent encounter for fracture with routine healing: Secondary | ICD-10-CM | POA: Diagnosis not present

## 2022-09-09 DIAGNOSIS — Z87891 Personal history of nicotine dependence: Secondary | ICD-10-CM | POA: Diagnosis not present

## 2022-09-09 DIAGNOSIS — M84462D Pathological fracture, left tibia, subsequent encounter for fracture with routine healing: Secondary | ICD-10-CM | POA: Diagnosis not present

## 2022-09-09 DIAGNOSIS — N400 Enlarged prostate without lower urinary tract symptoms: Secondary | ICD-10-CM | POA: Diagnosis not present

## 2022-09-09 DIAGNOSIS — Z7982 Long term (current) use of aspirin: Secondary | ICD-10-CM | POA: Diagnosis not present

## 2022-09-09 DIAGNOSIS — I10 Essential (primary) hypertension: Secondary | ICD-10-CM | POA: Diagnosis not present

## 2022-09-09 DIAGNOSIS — Z9181 History of falling: Secondary | ICD-10-CM | POA: Diagnosis not present

## 2022-09-09 DIAGNOSIS — E7800 Pure hypercholesterolemia, unspecified: Secondary | ICD-10-CM | POA: Diagnosis not present

## 2022-09-18 DIAGNOSIS — I10 Essential (primary) hypertension: Secondary | ICD-10-CM

## 2022-09-18 DIAGNOSIS — N183 Chronic kidney disease, stage 3 unspecified: Secondary | ICD-10-CM

## 2022-09-23 DIAGNOSIS — S82402D Unspecified fracture of shaft of left fibula, subsequent encounter for closed fracture with routine healing: Secondary | ICD-10-CM | POA: Diagnosis not present

## 2022-09-24 ENCOUNTER — Telehealth: Payer: PPO

## 2022-09-24 ENCOUNTER — Ambulatory Visit (INDEPENDENT_AMBULATORY_CARE_PROVIDER_SITE_OTHER): Payer: PPO

## 2022-09-24 DIAGNOSIS — I129 Hypertensive chronic kidney disease with stage 1 through stage 4 chronic kidney disease, or unspecified chronic kidney disease: Secondary | ICD-10-CM

## 2022-09-24 DIAGNOSIS — R413 Other amnesia: Secondary | ICD-10-CM

## 2022-09-24 DIAGNOSIS — I6930 Unspecified sequelae of cerebral infarction: Secondary | ICD-10-CM

## 2022-09-24 NOTE — Patient Instructions (Addendum)
Please call the care guide team at [phone] if you need to cancel or reschedule your appointment.   If you are experiencing a Mental Health or Behavioral Health Crisis or need someone to talk to, please call the Suicide and Crisis Lifeline: 988 call the [place] National Suicide Prevention Lifeline: 978-871-0124 or TTY: 6010936390 TTY 779-667-6034) to talk to a trained counselor call 1-800-273-TALK (toll free, 24 hour hotline)   Following is a copy of the CCM Program Consent:  CCM service includes personalized support from designated clinical staff supervised by the physician, including individualized plan of care and coordination with other care providers 24/7 contact phone numbers for assistance for urgent and routine care needs. Service will only be billed when office clinical staff spend 20 minutes or more in a month to coordinate care. Only one practitioner may furnish and bill the service in a calendar month. The patient may stop CCM services at amy time (effective at the end of the month) by phone call to the office staff. The patient will be responsible for cost sharing (co-pay) or up to 20% of the service fee (after annual deductible is met)  Following is a copy of your full provider care plan:   Goals Addressed             This Visit's Progress    CCM Expected Outcome:  Monitor, Self-Manage and Reduce Symptoms of Stoke Ischemic/TIA       Current Barriers:  Knowledge Deficits related to memory loss and following the plan of care related to past CVA/TIA Care Coordination needs related to ongoing support and education needs  in a patient with history of CVA with memory loss. Chronic Disease Management support and education needs related to effective management of post CVA with memory loss  Lacks caregiver support.  Non-adherence to scheduled provider appointments Non-adherence to prescribed medication regimen Cognitive Deficits  Planned Interventions: Evaluation of  current treatment plan related to effective management of past history of CVA and TIA with memory loss, especially short term memory loss and patient's adherence to plan as established by provider. The patient denies any acute changes. The patient can recall lots of information but short term memory is not good. The patient is home and doing well after hospital stay for broken ankle and rehab. His wife is still in the hospital and the patient states the courts decision was not in his favor. He wants to bring his wife home. He does go and visit her at the hospital when he can but currently he has a head cold and does not want her to get sick. Reflective listening and support.  [name] given information on how to get a life alert system and gave him the number to the [place] area. He also has the number to reach the [facility]. Education and support.  Advised patient to call the office for changes in mood, anxiety, depression, new questions and concerns related to past history of stroke with memory loss Provided education to patient re: calling for changes in his condition or new concerns related to his health and well being Reviewed medications with patient and discussed compliance. The patient is compliant with his medications. Praised the patient for taking his medications as directed.  Provided patient with doing crossword puzzles, memory recall activities and creating routines  educational materials related to history of CVA with memory loss and effective management of deficits related to memory loss. Reviewed scheduled/upcoming provider appointments including 09-29-2021 at 11 am. Reminder provided today. The patient  wrote the information down. Discussed plans with patient for ongoing care management follow up and provided patient with direct contact information for care management team Advised patient to discuss changes in his memory  with provider  Symptom Management: Take medications as prescribed    Attend all scheduled provider appointments Perform all self care activities independently  Call provider office for new concerns or questions  call the Suicide and Crisis Lifeline: 988 call the [place] National Suicide Prevention Lifeline: 671 071 3535 or TTY: (267)023-0994 TTY (435) 476-2024) to talk to a trained counselor call 1-800-273-TALK (toll free, 24 hour hotline) if experiencing a Mental Health or Behavioral Health Crisis   Follow Up Plan: Telephone follow up appointment with care management team member scheduled for: 11-12-2022 at 230 pm        CCM Expected Outcome:  Monitor, Self-Manage, and Reduce Symptoms of Hypertension       Current Barriers:  Knowledge Deficits related to effective management of HTN Care Coordination needs related to ongoing support and education needs  in a patient with HTN Chronic Disease Management support and education needs related to effective management of HTN Lacks caregiver support.  Non-adherence to scheduled provider appointments Non-adherence to prescribed medication regimen Cognitive Deficits  BP Readings from Last 3 Encounters:  09/02/22 112/62  06/26/22 135/63  05/11/22 120/70     Planned Interventions: Evaluation of current treatment plan related to hypertension self management and patient's adherence to plan as established by provider. The patient is doing well at home. Denies any new concerns with his HTN or heart health. Provided education to patient re: stroke prevention, s/s of heart attack and stroke. Reviewed with the patient; Reviewed prescribed diet heart healthy diet. Education. The patient states he is eating well. He says sometimes he eats "too well". Education on monitoring for hidden sodium in his diet. Denies any changes in his dietary habits Reviewed medications with patient and discussed importance of compliance. The patient states compliance with his medications. The patient reports compliance with medications. Discussed  plans with patient for ongoing care management follow up and provided patient with direct contact information for care management team; Advised patient, providing education and rationale, to monitor blood pressure daily and record, calling PCP for findings outside established parameters. The patient does not take his blood pressures at home. Review of the benefit of blood pressure checks;  Reviewed scheduled/upcoming provider appointments including: 09-30-2022 at 11 am. [name] has been taking him to his appointments.  Advised patient to discuss changes in blood pressures or heart health with provider; Provided education on prescribed diet heart healthy. Review of monitoring sodium intake, the patient knows not to add salt to his dishes.;  Discussed complications of poorly controlled blood pressure such as heart disease, stroke, circulatory complications, vision complications, kidney impairment, sexual dysfunction;  Screening for signs and symptoms of depression related to chronic disease state;  Assessed social determinant of health barriers;   Symptom Management: Take medications as prescribed   Attend all scheduled provider appointments Call pharmacy for medication refills 3-7 days in advance of running out of medications Call provider office for new concerns or questions  Work with the social worker to address care coordination needs and will continue to work with the clinical team to address health care and disease management related needs call the Suicide and Crisis Lifeline: 988 call the [place] National Suicide Prevention Lifeline: 978-423-8560 or TTY: 319-030-4393 TTY 850 575 7912) to talk to a trained counselor call 1-800-273-TALK (toll free, 24 hour hotline) if experiencing a  Mental Health or Behavioral Health Crisis  learn about high blood pressure call doctor for signs and symptoms of high blood pressure develop an action plan for high blood pressure keep all doctor  appointments take medications for blood pressure exactly as prescribed report new symptoms to your doctor eat more whole grains, fruits and vegetables, lean meats and healthy fats  Follow Up Plan: Telephone follow up appointment with care management team member scheduled for: 11-12-2022 at 230 pm          Patient verbalizes understanding of instructions and care plan provided today and agrees to view in MyChart. Active MyChart status and patient understanding of how to access instructions and care plan via MyChart confirmed with patient.  Telephone follow up appointment with care management team member scheduled for: 11-12-2022 at 230 pm

## 2022-09-24 NOTE — Chronic Care Management (AMB) (Signed)
Chronic Care Management   CCM RN Visit Note  09/24/2022 Name: [name] MRN: [id number] DOB: 14-Feb-1935  Subjective: [name] is a 88 y.o. year old male who is a primary care patient of [name], DO. The patient was referred to the Chronic Care Management team for assistance with care management needs subsequent to provider initiation of CCM services and plan of care.    Today's Visit:  Engaged with patient by telephone for follow up visit.        Goals Addressed             This Visit's Progress    CCM Expected Outcome:  Monitor, Self-Manage and Reduce Symptoms of [place] Ischemic/TIA       Current Barriers:  Knowledge Deficits related to memory loss and following the plan of care related to past CVA/TIA Care Coordination needs related to ongoing support and education needs  in a patient with history of CVA with memory loss. Chronic Disease Management support and education needs related to effective management of post CVA with memory loss  Lacks caregiver support.  Non-adherence to scheduled provider appointments Non-adherence to prescribed medication regimen Cognitive Deficits  Planned Interventions: Evaluation of current treatment plan related to effective management of past history of CVA and TIA with memory loss, especially short term memory loss and patient's adherence to plan as established by provider. The patient denies any acute changes. The patient can recall lots of information but short term memory is not good. The patient is home and doing well after hospital stay for broken ankle and rehab. His wife is still in the hospital and the patient states the courts decision was not in his favor. He wants to bring his wife home. He does go and visit her at the hospital when he can but currently he has a head cold and does not want her to get sick. Reflective listening and support.  Jonathan Needle given information on how to get a life alert system and gave him the  number to the [place] area. He also has the number to reach the [facility]. Education and support.  Advised patient to call the office for changes in mood, anxiety, depression, new questions and concerns related to past history of stroke with memory loss Provided education to patient re: calling for changes in his condition or new concerns related to his health and well being Reviewed medications with patient and discussed compliance. The patient is compliant with his medications. Praised the patient for taking his medications as directed.  Provided patient with doing crossword puzzles, memory recall activities and creating routines  educational materials related to history of CVA with memory loss and effective management of deficits related to memory loss. Reviewed scheduled/upcoming provider appointments including 09-29-2021 at 11 am. Reminder provided today. The patient wrote the information down. Discussed plans with patient for ongoing care management follow up and provided patient with direct contact information for care management team Advised patient to discuss changes in his memory  with provider  Symptom Management: Take medications as prescribed   Attend all scheduled provider appointments Perform all self care activities independently  Call provider office for new concerns or questions  call the Suicide and Crisis Lifeline: 988 call the [place] National Suicide Prevention Lifeline: 6052265167 or TTY: 918-524-1462 TTY (610)487-4891) to talk to a trained counselor call 1-800-273-TALK (toll free, 24 hour hotline) if experiencing a Mental Health or Behavioral Health Crisis   Follow Up Plan: Telephone follow up appointment with  care management team member scheduled for: 11-12-2022 at 230 pm        CCM Expected Outcome:  Monitor, Self-Manage, and Reduce Symptoms of Hypertension       Current Barriers:  Knowledge Deficits related to effective management of HTN Care Coordination needs  related to ongoing support and education needs  in a patient with HTN Chronic Disease Management support and education needs related to effective management of HTN Lacks caregiver support.  Non-adherence to scheduled provider appointments Non-adherence to prescribed medication regimen Cognitive Deficits  BP Readings from Last 3 Encounters:  09/02/22 112/62  06/26/22 135/63  05/11/22 120/70     Planned Interventions: Evaluation of current treatment plan related to hypertension self management and patient's adherence to plan as established by provider. The patient is doing well at home. Denies any new concerns with his HTN or heart health. Provided education to patient re: stroke prevention, s/s of heart attack and stroke. Reviewed with the patient; Reviewed prescribed diet heart healthy diet. Education. The patient states he is eating well. He says sometimes he eats "too well". Education on monitoring for hidden sodium in his diet. Denies any changes in his dietary habits Reviewed medications with patient and discussed importance of compliance. The patient states compliance with his medications. The patient reports compliance with medications. Discussed plans with patient for ongoing care management follow up and provided patient with direct contact information for care management team; Advised patient, providing education and rationale, to monitor blood pressure daily and record, calling PCP for findings outside established parameters. The patient does not take his blood pressures at home. Review of the benefit of blood pressure checks;  Reviewed scheduled/upcoming provider appointments including: 09-30-2022 at 11 am. Jonathan Needle has been taking him to his appointments.  Advised patient to discuss changes in blood pressures or heart health with provider; Provided education on prescribed diet heart healthy. Review of monitoring sodium intake, the patient knows not to add salt to his dishes.;   Discussed complications of poorly controlled blood pressure such as heart disease, stroke, circulatory complications, vision complications, kidney impairment, sexual dysfunction;  Screening for signs and symptoms of depression related to chronic disease state;  Assessed social determinant of health barriers;   Symptom Management: Take medications as prescribed   Attend all scheduled provider appointments Call pharmacy for medication refills 3-7 days in advance of running out of medications Call provider office for new concerns or questions  Work with the social worker to address care coordination needs and will continue to work with the clinical team to address health care and disease management related needs call the Suicide and Crisis Lifeline: 988 call the [place] National Suicide Prevention Lifeline: 3858637430 or TTY: 226-591-1551 TTY 870 743 3879) to talk to a trained counselor call 1-800-273-TALK (toll free, 24 hour hotline) if experiencing a Mental Health or Behavioral Health Crisis  learn about high blood pressure call doctor for signs and symptoms of high blood pressure develop an action plan for high blood pressure keep all doctor appointments take medications for blood pressure exactly as prescribed report new symptoms to your doctor eat more whole grains, fruits and vegetables, lean meats and healthy fats  Follow Up Plan: Telephone follow up appointment with care management team member scheduled for: 11-12-2022 at 230 pm          Plan:Telephone follow up appointment with care management team member scheduled for:  11-12-2022 at 1 pm  Alto Denver RN, MSN, CCM RN Care Manager  Chronic Care Management Direct  Number: [phone]

## 2022-09-25 DIAGNOSIS — S82402D Unspecified fracture of shaft of left fibula, subsequent encounter for closed fracture with routine healing: Secondary | ICD-10-CM | POA: Diagnosis not present

## 2022-09-30 ENCOUNTER — Ambulatory Visit: Payer: PPO | Admitting: Family Medicine

## 2022-10-05 ENCOUNTER — Telehealth: Payer: Self-pay

## 2022-10-05 NOTE — Telephone Encounter (Signed)
[name] with [employer] Adult [employer] services called asking if patient has any memory loss issues.  She also requested the patients medical records.  Recommended she send the request via fax.

## 2022-10-14 DIAGNOSIS — S82852A Displaced trimalleolar fracture of left lower leg, initial encounter for closed fracture: Secondary | ICD-10-CM | POA: Diagnosis not present

## 2022-10-18 DIAGNOSIS — N183 Chronic kidney disease, stage 3 unspecified: Secondary | ICD-10-CM

## 2022-10-18 DIAGNOSIS — I129 Hypertensive chronic kidney disease with stage 1 through stage 4 chronic kidney disease, or unspecified chronic kidney disease: Secondary | ICD-10-CM

## 2022-11-12 ENCOUNTER — Telehealth: Payer: Medicare HMO

## 2022-11-12 ENCOUNTER — Telehealth: Payer: Self-pay

## 2022-11-12 NOTE — Telephone Encounter (Signed)
   CCM RN Visit Note   11-12-2022 Name: [name] MRN: [id number]      DOB: Aug 04, 1934  Subjective: [name] is a 88 y.o. year old male who is a primary care patient of DR. Althea Charon.  The patient was referred to the Chronic Care Management team for assistance with care management needs subsequent to provider initiation of CCM services and plan of care.      An unsuccessful telephone outreach was attempted today to contact the patient about Chronic Care Management needs.    Plan:The care management team will reach out to the patient again over the next 30 days.  Alto Denver RN, MSN, CCM RN Care Manager  Chronic Care Management Direct Number: [phone]

## 2022-11-16 ENCOUNTER — Ambulatory Visit
Admission: EM | Admit: 2022-11-16 | Discharge: 2022-11-16 | Disposition: A | Discharge status: Other Acute Inpatient Hospital | Payer: PPO

## 2022-11-16 ENCOUNTER — Emergency Department
Admission: EM | Admit: 2022-11-16 | Discharge: 2022-11-16 | Disposition: A | Discharge status: Home / Self Care | Payer: PPO | Attending: Emergency Medicine | Admitting: Emergency Medicine

## 2022-11-16 ENCOUNTER — Encounter: Payer: Self-pay | Admitting: Emergency Medicine

## 2022-11-16 ENCOUNTER — Other Ambulatory Visit: Payer: Self-pay

## 2022-11-16 DIAGNOSIS — N189 Chronic kidney disease, unspecified: Secondary | ICD-10-CM | POA: Insufficient documentation

## 2022-11-16 DIAGNOSIS — I129 Hypertensive chronic kidney disease with stage 1 through stage 4 chronic kidney disease, or unspecified chronic kidney disease: Secondary | ICD-10-CM | POA: Diagnosis not present

## 2022-11-16 DIAGNOSIS — R339 Retention of urine, unspecified: Secondary | ICD-10-CM | POA: Diagnosis not present

## 2022-11-16 DIAGNOSIS — N401 Enlarged prostate with lower urinary tract symptoms: Secondary | ICD-10-CM | POA: Diagnosis not present

## 2022-11-16 DIAGNOSIS — R32 Unspecified urinary incontinence: Secondary | ICD-10-CM

## 2022-11-16 LAB — URINALYSIS, ROUTINE W REFLEX MICROSCOPIC
Bilirubin Urine: NEGATIVE
Glucose, UA: NEGATIVE mg/dL
Hgb urine dipstick: NEGATIVE
Ketones, ur: NEGATIVE mg/dL
Leukocytes,Ua: NEGATIVE
Nitrite: NEGATIVE
Protein, ur: NEGATIVE mg/dL
Specific Gravity, Urine: 1.009 (ref 1.005–1.030)
pH: 6 (ref 5.0–8.0)

## 2022-11-16 NOTE — Discharge Instructions (Signed)
Go to ER right now, you will need to be catherized

## 2022-11-16 NOTE — Discharge Instructions (Addendum)
Your exam and labs are more consistent with urinary incontinence as opposed to urinary retention.  Your bladder scan did not show evidence of a full bladder or urinary retention.  You have void spontaneously in the ED, but were not fully aware that you were voiding at the time.  You should continue to wear your disposable briefs to help avoid accidents.  Follow-up with your primary provider or [facility] for ongoing evaluation and management.

## 2022-11-16 NOTE — ED Notes (Signed)
See triage note  Presents with possible urinary retention  States he has not voided all day

## 2022-11-16 NOTE — ED Triage Notes (Signed)
Patient states he is unsure if his prostate is enlarged but that he feels like his penis has shrunk and there is a "big balloon" above his penis. States he has to urinate frequently and sometimes when he does it is just a little dribble.

## 2022-11-16 NOTE — ED Notes (Signed)
Patient is being discharged from the Urgent Care and sent to the Emergency Department via POV . Per Nettie Elm, PA-C, patient is in need of higher level of care due to inability to urinate. Patient is aware and verbalizes understanding of plan of care.  Vitals:   11/16/22 1553  BP: (!) 157/116  Pulse: 66  Resp: 18  Temp: 97.8 F (36.6 C)  SpO2: 96%

## 2022-11-16 NOTE — ED Triage Notes (Signed)
Pt here from UC with urinary retention. Pt states he was told to come here to get a foley catheter. Pt states he is unsure of last time he urinated.

## 2022-11-16 NOTE — ED Notes (Signed)
Pt was able to void   speciman sent to lab   Provider aware

## 2022-11-16 NOTE — ED Provider Notes (Addendum)
[facility] Emergency Department Provider Note     Event Date/Time   First MD Initiated Contact with Patient 11/16/22 1809     (approximate)   History   Urinary Retention   HPI  [name] is a 88 y.o. male with a review consistent with BPH, HTN, CKD, elevated PSA, and memory loss, presents to the ED with concern for possible urinary retention.  Presents to the ED from a local urgent care where he presented with concern for urinary retention.  Patient could not recall the last time he spontaneously voided.  Patient normally wears disposable briefs for apparent urinary retention.  He was also unaware of and unable to confirm his medical history of BPH.  Denies any fevers, chills, sweats, chest pain, abdominal pain, or hematuria.  Patient denies any prior history of Foley catheter use.   Physical Exam   Triage Vital Signs: ED Triage Vitals  Encounter Vitals Group     BP 11/16/22 1813 (!) 155/79     Systolic BP Percentile --      Diastolic BP Percentile --      Pulse Rate 11/16/22 1813 69     Resp 11/16/22 1813 18     Temp 11/16/22 1813 (!) 97.4 F (36.3 C)     Temp Source 11/16/22 1813 Oral     SpO2 11/16/22 1813 96 %     Weight 11/16/22 1809 225 lb 1.4 oz (102.1 kg)     Height 11/16/22 1809 5\' 10"  (1.778 m)     Head Circumference --      Peak Flow --      Pain Score --      Pain Loc --      Pain Education --      Exclude from Growth Chart --     Most recent vital signs: Vitals:   11/16/22 1813  BP: (!) 155/79  Pulse: 69  Resp: 18  Temp: (!) 97.4 F (36.3 C)  SpO2: 96%    General Awake, no distress. NAD CV:  Good peripheral perfusion.  RESP:  Normal effort.  ABD:  No distention.  Nontender GU:  Normal external genitalia including an uncircumcised penile shaft.  No penile lesions appreciated.   ED Results / Procedures / Treatments   Labs (all labs ordered are listed, but only abnormal results are displayed) Labs Reviewed   URINALYSIS, ROUTINE W REFLEX MICROSCOPIC - Abnormal; Notable for the following components:      Result Value   Color, Urine STRAW (*)    APPearance CLEAR (*)    All other components within normal limits     EKG   RADIOLOGY  No results found.   PROCEDURES:  Critical Care performed: No  Procedures Urinary bladder scan showing less than 20 cc of urine  MEDICATIONS ORDERED IN ED: Medications - No data to display   IMPRESSION / MDM / ASSESSMENT AND PLAN / ED COURSE  I reviewed the triage vital signs and the nursing notes.                              Differential diagnosis includes, but is not limited to, urinary retention, BPH, cystitis, urethritis, urinary incontinence  Patient's presentation is most consistent with acute complicated illness / injury requiring diagnostic workup.  Patient's diagnosis is consistent with urinary incontinence as opposed to retention.  Presents to the ED with concern for retention as he was  not aware that he had been spontaneously passing urine despite his use of disposable briefs.  Patient with a normal exam and reassuring UA without evidence of hematuria or leukocyturia, or presents in no acute distress.  Patient was able to spontaneously void during my evaluation, but had decreased urinary urge prior to spontaneous voiding.  No indication at this time for an indwelling Foley catheter.  Patient will be discharged with instructions follow-up with primary provider or [facility] for ongoing evaluation of his BPH and urinary incontinence. Patient is given ED precautions to return to the ED for any worsening or new symptoms.   FINAL CLINICAL IMPRESSION(S) / ED DIAGNOSES   Final diagnoses:  Benign prostatic hyperplasia with weak urinary stream  Urinary incontinence, unspecified type     Rx / DC Orders   ED Discharge Orders     None        Note:  This document was prepared using Dragon voice recognition software and may include  unintentional dictation errors.    Lissa Hoard, PA-C 11/16/22 1926    Lissa Hoard, Cordelia Poche 11/16/22 Diamantina Monks, MD 11/16/22 [phone]

## 2022-11-16 NOTE — ED Provider Notes (Signed)
[facility]    CSN: [id number] Arrival date & time: 11/16/22  1406      History   Chief Complaint Chief Complaint  Patient presents with   Groin Pain    HPI [name] is a 88 y.o. male who presents with swelling above his penis and having urinary frequency and occasional dribbling. Has been having wearing depends for the past month. Has been passing drops of urine, but is getting worse.     Past Medical History:  Diagnosis Date   Bowel obstruction (HCC)    BPH (benign prostatic hypertrophy)    Gastric outlet obstruction    High cholesterol    Hyperchloremia    Hypertension    Pancreatitis     Patient Active Problem List   Diagnosis Date Noted   Hypokalemia 06/26/2022   Hypomagnesemia 06/26/2022   Hypocalcemia 06/26/2022   Normal anion gap metabolic acidosis 06/26/2022   AKI (acute kidney injury) (HCC) 06/25/2022   Fall 06/23/2022   Fracture of left tibia and fibula, closed, initial encounter 06/22/2022   Cognitive deficit as late effect of cerebrovascular accident (CVA) 06/25/2020   Imbalance 02/01/2018   Humeral head fracture, left, closed, initial encounter 09/22/2017   History of cerebrovascular accident (CVA) with residual deficit 09/09/2017   CKD (chronic kidney disease), stage III (HCC) 09/01/2017   Elevated PSA, between 10 and less than 20 ng/ml 07/22/2016   Bilateral lower extremity edema 07/22/2016   Abnormal glucose 07/15/2016   BPH with obstruction/lower urinary tract symptoms 04/10/2016   Benign hypertension with CKD (chronic kidney disease) stage III (HCC) 04/10/2016   Memory loss 04/10/2016   Gastric outlet obstruction 12/02/2014   Generalized abdominal pain    Benign fibroma of prostate 01/02/2011   HLD (hyperlipidemia) 01/02/2011   Obesity (BMI 30.0-34.9) 01/02/2011    Past Surgical History:  Procedure Laterality Date   ESOPHAGOGASTRODUODENOSCOPY N/A 12/03/2014   Procedure: ESOPHAGOGASTRODUODENOSCOPY (EGD);  Surgeon: Wallace Cullens, MD;  Location[facility] ENDOSCOPY;  Service: Endoscopy;  Laterality: N/A;   ORIF ANKLE FRACTURE Left 06/23/2022   Procedure: OPEN REDUCTION INTERNAL FIXATION (ORIF) ANKLE FRACTURE;  Surgeon: Juanell Fairly, MD;  Location: [facility] ORS;  Service: Orthopedics;  Laterality: Left;       Home Medications    Prior to Admission medications   Medication Sig Start Date End Date Taking? Authorizing Provider  acetaminophen (TYLENOL) 325 MG tablet Take 2 tablets (650 mg total) by mouth every 6 (six) hours as needed for mild pain, fever or headache (or Fever >/= 101). 06/26/22   Pennie Banter, DO  aspirin EC 81 MG tablet Take 81 mg by mouth daily. Swallow whole.    [provider]  atorvastatin (LIPITOR) 40 MG tablet Take 1 tablet (40 mg total) by mouth daily. 09/02/22   Karamalegos, Netta Neat, DO  clopidogrel (PLAVIX) 75 MG tablet Take 1 tablet (75 mg total) by mouth daily. 09/02/22   Karamalegos, Netta Neat, DO  finasteride (PROSCAR) 5 MG tablet Take 1 tablet (5 mg total) by mouth daily. 09/02/22   Karamalegos, Netta Neat, DO  lisinopril (ZESTRIL) 10 MG tablet Take 1 tablet (10 mg total) by mouth daily. 09/02/22   Karamalegos, Netta Neat, DO  Multiple Vitamins-Minerals (MULTIVITAMIN ADULTS 50+ PO) Take by mouth.    [provider]  Omega-3 Fatty Acids (FISH OIL) 1200 MG CAPS Take by mouth daily at 6 (six) AM.    [provider]  polyethylene glycol (MIRALAX / GLYCOLAX) 17 g packet Take 17  g by mouth daily as needed for mild constipation. 09/02/22   [name], DO    Family History Family History  Problem Relation Age of Onset   Heart disease Mother    Heart disease Father    Heart disease Brother     Social History Social History   Tobacco Use   Smoking status: Former    Current packs/day: 0.00    Types: Cigarettes    Quit date: 1985    Years since quitting: 39.6    Passive exposure: Past   Smokeless tobacco: Former  Building services engineer status:  Never Used  Substance Use Topics   Alcohol use: No   Drug use: No     Allergies   Patient has no known allergies.   Review of Systems Review of Systems  As noted  Physical Exam Triage Vital Signs ED Triage Vitals  Encounter Vitals Group     BP 11/16/22 1553 (!) 157/116     Systolic BP Percentile --      Diastolic BP Percentile --      Pulse Rate 11/16/22 1553 66     Resp 11/16/22 1553 18     Temp 11/16/22 1553 97.8 F (36.6 C)     Temp Source 11/16/22 1553 Oral     SpO2 11/16/22 1553 96 %     Weight --      Height --      Head Circumference --      Peak Flow --      Pain Score 11/16/22 1554 0     Pain Loc --      Pain Education --      Exclude from Growth Chart --    No data found.  Updated Vital Signs BP (!) 157/116 (BP Location: Right Arm)   Pulse 66   Temp 97.8 F (36.6 C) (Oral)   Resp 18   SpO2 96%   Visual Acuity Right Eye Distance:   Left Eye Distance:   Bilateral Distance:    Right Eye Near:   Left Eye Near:    Bilateral Near:     Physical Exam Vitals and nursing note reviewed.  Constitutional:      General: He is not in acute distress.    Appearance: He is normal weight. He is not toxic-appearing.  HENT:     Right Ear: External ear normal.     Left Ear: External ear normal.  Eyes:     General: No scleral icterus.    Conjunctiva/sclera: Conjunctivae normal.  Abdominal:     General: Bowel sounds are normal.     Palpations: Abdomen is soft.     Tenderness: There is abdominal tenderness. There is no guarding or rebound.     Comments: Has mild tenderness on suprapubic region, but his bladder does not seem distended  Genitourinary:    Penis: Normal.      Testes: Normal.     Comments: No inguinal hernia noted. No masses present on suprapubic region.  Musculoskeletal:     Cervical back: Neck supple.  Neurological:     Mental Status: He is alert and oriented to person, place, and time.     Gait: Gait normal.  Psychiatric:        Mood  and Affect: Mood normal.        Behavior: Behavior normal.        Thought Content: Thought content normal.        Judgment: Judgment normal.  UC Treatments / Results  Labs (all labs ordered are listed, but only abnormal results are displayed) Labs Reviewed - No data to display  EKG   Radiology No results found.  Procedures Procedures (including critical care time)  Medications Ordered in UC Medications - No data to display  Initial Impression / Assessment and Plan / UC Course  I have reviewed the triage vital signs and the nursing notes.  Urinary retention   He was sent to ER since he could not void for [place] to test it.  Final Clinical Impressions(s) / UC Diagnoses   Final diagnoses:  Urinary retention     Discharge Instructions      Go to ER right now, you will need to be catherized      ED Prescriptions   None    PDMP not reviewed this encounter.   Garey Ham, Cordelia Poche 11/16/22 2004

## 2022-11-17 ENCOUNTER — Telehealth: Payer: Self-pay

## 2022-11-17 NOTE — Transitions of Care (Post Inpatient/ED Visit) (Signed)
   11/17/2022  Name: [name] MRN: [id number] DOB: 12/02/1934  Today's TOC FU Call Status: Today's TOC FU Call Status:: Unsuccessul Call (1st Attempt) Unsuccessful Call (1st Attempt) Date: 11/17/22  Attempted to reach the patient regarding the most recent Inpatient/ED visit.  Follow Up Plan: Additional outreach attempts will be made to reach the patient to complete the Transitions of Care (Post Inpatient/ED visit) call.   Alto Denver RN, MSN, CCM RN Care Manager  Chronic Care Management Direct Number: [phone]

## 2022-11-18 ENCOUNTER — Ambulatory Visit (INDEPENDENT_AMBULATORY_CARE_PROVIDER_SITE_OTHER): Payer: Medicare HMO

## 2022-11-18 DIAGNOSIS — R413 Other amnesia: Secondary | ICD-10-CM

## 2022-11-18 DIAGNOSIS — E782 Mixed hyperlipidemia: Secondary | ICD-10-CM

## 2022-11-18 DIAGNOSIS — I6930 Unspecified sequelae of cerebral infarction: Secondary | ICD-10-CM

## 2022-11-18 DIAGNOSIS — I129 Hypertensive chronic kidney disease with stage 1 through stage 4 chronic kidney disease, or unspecified chronic kidney disease: Secondary | ICD-10-CM

## 2022-11-18 DIAGNOSIS — N138 Other obstructive and reflux uropathy: Secondary | ICD-10-CM

## 2022-11-18 DIAGNOSIS — N183 Chronic kidney disease, stage 3 unspecified: Secondary | ICD-10-CM

## 2022-11-18 DIAGNOSIS — N401 Enlarged prostate with lower urinary tract symptoms: Secondary | ICD-10-CM

## 2022-11-18 NOTE — Patient Instructions (Signed)
Please call the care guide team at [phone] if you need to cancel or reschedule your appointment.   If you are experiencing a Mental Health or Behavioral Health Crisis or need someone to talk to, please call the Suicide and Crisis Lifeline: 988 call the [place] National Suicide Prevention Lifeline: 3345597970 or TTY: 734-145-4341 TTY (575) 358-9134) to talk to a trained counselor call 1-800-273-TALK (toll free, 24 hour hotline)   Following is a copy of the CCM Program Consent:  CCM service includes personalized support from designated clinical staff supervised by the physician, including individualized plan of care and coordination with other care providers 24/7 contact phone numbers for assistance for urgent and routine care needs. Service will only be billed when office clinical staff spend 20 minutes or more in a month to coordinate care. Only one practitioner may furnish and bill the service in a calendar month. The patient may stop CCM services at amy time (effective at the end of the month) by phone call to the office staff. The patient will be responsible for cost sharing (co-pay) or up to 20% of the service fee (after annual deductible is met)  Following is a copy of your full provider care plan:  This was a transitions of care call for follow up after ED visit on 11-16-2022 for urinary retention   Patient verbalizes understanding of instructions and care plan provided today and agrees to view in MyChart. Active MyChart status and patient understanding of how to access instructions and care plan via MyChart confirmed with patient.  Telephone follow up appointment with care management team member scheduled for: as scheduled

## 2022-11-18 NOTE — Transitions of Care (Post Inpatient/ED Visit) (Signed)
   11/18/2022  Name: [name] MRN: [id number] DOB: [birth year]  Today's TOC FU Call Status: Today's TOC FU Call Status:: Successful TOC FU Call Completed TOC FU Call Complete Date: 11/18/22  Transition Care Management Follow-up Telephone Call Date of Discharge: 11/16/22 Discharge Facility: [facility]) Type of Discharge: Emergency Department Reason for ED Visit: Other: (urinary retention) How have you been since you were released from the hospital?: Better Any questions or concerns?: No  Items Reviewed: Did you receive and understand the discharge instructions provided?: Yes Medications obtained,verified, and reconciled?: Yes (Medications Reviewed) Any new allergies since your discharge?: No Dietary orders reviewed?: NA Do you have support at home?: No People in Home: alone Name of Support/Comfort Primary Source: His friend Cheryle Horsfall helps him if needed  Medications Reviewed Today: Medications Reviewed Today     Reviewed by Marlowe Sax, RN (Case Manager) on 11/18/22 at 1347  Med List Status: <None>   Medication Order Taking? Sig Documenting Provider Last Dose Status Informant  acetaminophen (TYLENOL) 325 MG tablet [id number]  Take 2 tablets (650 mg total) by mouth every 6 (six) hours as needed for mild pain, fever or headache (or Fever >/= 101). Esaw Grandchild A, DO  Active   aspirin EC 81 MG tablet [id number]  Take 81 mg by mouth daily. Swallow whole. [provider]  Active   atorvastatin (LIPITOR) 40 MG tablet [id number]  Take 1 tablet (40 mg total) by mouth daily. [name], DO  Active   clopidogrel (PLAVIX) 75 MG tablet [id number]  Take 1 tablet (75 mg total) by mouth daily. Karamalegos, Netta Neat, DO  Active   finasteride (PROSCAR) 5 MG tablet [id number]  Take 1 tablet (5 mg total) by mouth daily. Karamalegos, Netta Neat, DO  Active   lisinopril (ZESTRIL) 10 MG tablet [id number]  Take 1 tablet (10 mg total) by mouth  daily. [name], DO  Active   Multiple Vitamins-Minerals (MULTIVITAMIN ADULTS 50+ PO) [id number] No Take by mouth. [provider] Taking Active Self  Omega-3 Fatty Acids (FISH OIL) 1200 MG CAPS [id number] No Take by mouth daily at 6 (six) AM. [provider] Taking Active   polyethylene glycol (MIRALAX / GLYCOLAX) 17 g packet [id number]  Take 17 g by mouth daily as needed for mild constipation. [name], DO  Active             Home Care and Equipment/Supplies: Were Home Health Services Ordered?: NA Any new equipment or medical supplies ordered?: NA  Functional Questionnaire: Do you need assistance with bathing/showering or dressing?: No Do you need assistance with meal preparation?: No Do you need assistance with eating?: No Do you have difficulty maintaining continence: No Do you need assistance with getting out of bed/getting out of a chair/moving?: No Do you have difficulty managing or taking your medications?: No  Follow up appointments reviewed: PCP Follow-up appointment confirmed?: No MD Provider Line Number:[phone] Given: Yes (ask the patient to follow up with pcp, will send staff message as well) [facility] Hospital Follow-up appointment confirmed?: NA Do you need transportation to your follow-up appointment?: No Do you understand care options if your condition(s) worsen?: Yes-patient verbalized understanding   Alto Denver RN, MSN, CCM RN Care Manager  Chronic Care Management Direct Number: [phone]

## 2022-12-03 ENCOUNTER — Other Ambulatory Visit: Payer: PPO

## 2022-12-03 ENCOUNTER — Telehealth: Payer: TRICARE For Life (TFL)

## 2022-12-03 ENCOUNTER — Other Ambulatory Visit: Payer: Self-pay

## 2022-12-03 NOTE — Patient Outreach (Signed)
Care Management   Visit Note  12/03/2022 Name: [name] MRN: [id number] DOB: [birth year]  Subjective: [name] is a 88 y.o. year old male who is a primary care patient of [name], DO. The Care Management team was consulted for assistance.      Engaged with patient spoke with patient by telephone.    Goals Addressed             This Visit's Progress    RNCM Care Management Expected Outcome:  Monitor, Self-Manage and Reduce Symptoms of Stoke Ischemic/TIA       Current Barriers:  Knowledge Deficits related to memory loss and following the plan of care related to past CVA/TIA Care Coordination needs related to ongoing support and education needs  in a patient with history of CVA with memory loss. Chronic Disease Management support and education needs related to effective management of post CVA with memory loss  Lacks caregiver support.  Non-adherence to scheduled provider appointments Non-adherence to prescribed medication regimen Cognitive Deficits  Planned Interventions: Evaluation of current treatment plan related to effective management of past history of CVA and TIA with memory loss, especially short term memory loss and patient's adherence to plan as established by provider. The patient denies any acute changes. The patient can recall lots of information but short term memory is not good. The patient states he is doing great. He denies any acute changes in his conditions. He still  having issues with memory but nothing new. He states his wife is still in "prison". She has been in SNF for greater than 100 days. He has hired a Clinical research associate and is hoping to get her out.  [name] given information on how to get a life alert system and gave him the number to the [place] area. He also has the number to reach the [facility]. Education and support.  Advised patient to call the office for changes in mood, anxiety, depression, new questions and concerns related to past history  of stroke with memory loss Provided education to patient re: calling for changes in his condition or new concerns related to his health and well being Reviewed medications with patient and discussed compliance. The patient is compliant with his medications. Praised the patient for taking his medications as directed.  Provided patient with doing crossword puzzles, memory recall activities and creating routines  educational materials related to history of CVA with memory loss and effective management of deficits related to memory loss. Reviewed scheduled/upcoming provider appointments including. The patient has no upcoming appointments with the pcp. Education on calling for changes and needs.  Discussed plans with patient for ongoing care management follow up and provided patient with direct contact information for care management team Advised patient to discuss changes in his memory  with provider  Symptom Management: Take medications as prescribed   Attend all scheduled provider appointments Perform all self care activities independently  Call provider office for new concerns or questions  call the Suicide and Crisis Lifeline: 988 call the [place] National Suicide Prevention Lifeline: 226-299-9385 or TTY: 402-620-6313 TTY 740-358-0154) to talk to a trained counselor call 1-800-273-TALK (toll free, 24 hour hotline) if experiencing a Mental Health or Behavioral Health Crisis   Follow Up Plan: Telephone follow up appointment with care management team member scheduled for: 01-14-2023 at 345 pm        RNCM Care Management:  Expected Outcome:  Monitor, Self-Manage, and Reduce Symptoms of Hypertension       Current Barriers:  Knowledge Deficits related to effective management of HTN Care Coordination needs related to ongoing support and education needs  in a patient with HTN Chronic Disease Management support and education needs related to effective management of HTN Lacks caregiver support.   Non-adherence to scheduled provider appointments Non-adherence to prescribed medication regimen Cognitive Deficits  BP Readings from Last 3 Encounters:  11/16/22 (!) 155/79  11/16/22 (!) 157/116  09/02/22 112/62     Planned Interventions: Evaluation of current treatment plan related to hypertension self management and patient's adherence to plan as established by provider. The patient states he is doing well. He denies any acute changes in her HTN or heart health. His blood pressures have been a little elevated the last couple provider visits. The patient states he is taking his medications. Education and support given.  Provided education to patient re: stroke prevention, s/s of heart attack and stroke. Reviewed with the patient; Reviewed prescribed diet heart healthy diet. Education. The patient states he is eating well. He says sometimes he eats "too well". Education on monitoring for hidden sodium in his diet. Denies any changes in his dietary habits Reviewed medications with patient and discussed importance of compliance. The patient states compliance with his medications. The patient reports compliance with medications. Discussed plans with patient for ongoing care management follow up and provided patient with direct contact information for care management team; Advised patient, providing education and rationale, to monitor blood pressure daily and record, calling PCP for findings outside established parameters. The patient does not take his blood pressures at home. Review of the benefit of blood pressure checks;  Reviewed scheduled/upcoming provider appointments including: No upcoming visits with the pcp Advised patient to discuss changes in blood pressures or heart health with provider; Provided education on prescribed diet heart healthy. Review of monitoring sodium intake, the patient knows not to add salt to his dishes.;  Discussed complications of poorly controlled blood pressure  such as heart disease, stroke, circulatory complications, vision complications, kidney impairment, sexual dysfunction;  Screening for signs and symptoms of depression related to chronic disease state;  Assessed social determinant of health barriers;   Symptom Management: Take medications as prescribed   Attend all scheduled provider appointments Call pharmacy for medication refills 3-7 days in advance of running out of medications Call provider office for new concerns or questions  Work with the social worker to address care coordination needs and will continue to work with the clinical team to address health care and disease management related needs call the Suicide and Crisis Lifeline: 988 call the [place] National Suicide Prevention Lifeline: (463) 196-2212 or TTY: 952-784-9935 TTY 256-641-3354) to talk to a trained counselor call 1-800-273-TALK (toll free, 24 hour hotline) if experiencing a Mental Health or Behavioral Health Crisis  learn about high blood pressure call doctor for signs and symptoms of high blood pressure develop an action plan for high blood pressure keep all doctor appointments take medications for blood pressure exactly as prescribed report new symptoms to your doctor eat more whole grains, fruits and vegetables, lean meats and healthy fats  Follow Up Plan: Telephone follow up appointment with care management team member scheduled for: 01-14-2023 at 345 pm           Consent to Services:  Patient was given information about care management services, agreed to services, and gave verbal consent to participate.   Plan: Telephone follow up appointment with care management team member scheduled for: 01-14-2023 at 345 pm  Alto Denver RN, MSN,  CCM RN Care Manager  [facility]  Ambulatory Care Management  Direct Number: [phone]

## 2022-12-03 NOTE — Patient Instructions (Signed)
Visit Information  Thank you for taking time to visit with me today. Please don't hesitate to contact me if I can be of assistance to you before our next scheduled telephone appointment.  Following are the goals we discussed today:   Goals Addressed             This Visit's Progress    RNCM Care Management Expected Outcome:  Monitor, Self-Manage and Reduce Symptoms of Stoke Ischemic/TIA       Current Barriers:  Knowledge Deficits related to memory loss and following the plan of care related to past CVA/TIA Care Coordination needs related to ongoing support and education needs  in a patient with history of CVA with memory loss. Chronic Disease Management support and education needs related to effective management of post CVA with memory loss  Lacks caregiver support.  Non-adherence to scheduled provider appointments Non-adherence to prescribed medication regimen Cognitive Deficits  Planned Interventions: Evaluation of current treatment plan related to effective management of past history of CVA and TIA with memory loss, especially short term memory loss and patient's adherence to plan as established by provider. The patient denies any acute changes. The patient can recall lots of information but short term memory is not good. The patient states he is doing great. He denies any acute changes in his conditions. He still  having issues with memory but nothing new. He states his wife is still in "prison". She has been in SNF for greater than 100 days. He has hired a Clinical research associate and is hoping to get her out.  [name] given information on how to get a life alert system and gave him the number to the [place] area. He also has the number to reach the [facility]. Education and support.  Advised patient to call the office for changes in mood, anxiety, depression, new questions and concerns related to past history of stroke with memory loss Provided education to patient re: calling for changes in his condition  or new concerns related to his health and well being Reviewed medications with patient and discussed compliance. The patient is compliant with his medications. Praised the patient for taking his medications as directed.  Provided patient with doing crossword puzzles, memory recall activities and creating routines  educational materials related to history of CVA with memory loss and effective management of deficits related to memory loss. Reviewed scheduled/upcoming provider appointments including. The patient has no upcoming appointments with the pcp. Education on calling for changes and needs.  Discussed plans with patient for ongoing care management follow up and provided patient with direct contact information for care management team Advised patient to discuss changes in his memory  with provider  Symptom Management: Take medications as prescribed   Attend all scheduled provider appointments Perform all self care activities independently  Call provider office for new concerns or questions  call the Suicide and Crisis Lifeline: 988 call the [place] National Suicide Prevention Lifeline: 201-645-4900 or TTY: 628 134 5679 TTY (567)560-9108) to talk to a trained counselor call 1-800-273-TALK (toll free, 24 hour hotline) if experiencing a Mental Health or Behavioral Health Crisis   Follow Up Plan: Telephone follow up appointment with care management team member scheduled for: 01-14-2023 at 345 pm        RNCM Care Management:  Expected Outcome:  Monitor, Self-Manage, and Reduce Symptoms of Hypertension       Current Barriers:  Knowledge Deficits related to effective management of HTN Care Coordination needs related to ongoing support and education needs  in a patient with HTN Chronic Disease Management support and education needs related to effective management of HTN Lacks caregiver support.  Non-adherence to scheduled provider appointments Non-adherence to prescribed medication  regimen Cognitive Deficits  BP Readings from Last 3 Encounters:  11/16/22 (!) 155/79  11/16/22 (!) 157/116  09/02/22 112/62     Planned Interventions: Evaluation of current treatment plan related to hypertension self management and patient's adherence to plan as established by provider. The patient states he is doing well. He denies any acute changes in her HTN or heart health. His blood pressures have been a little elevated the last couple provider visits. The patient states he is taking his medications. Education and support given.  Provided education to patient re: stroke prevention, s/s of heart attack and stroke. Reviewed with the patient; Reviewed prescribed diet heart healthy diet. Education. The patient states he is eating well. He says sometimes he eats "too well". Education on monitoring for hidden sodium in his diet. Denies any changes in his dietary habits Reviewed medications with patient and discussed importance of compliance. The patient states compliance with his medications. The patient reports compliance with medications. Discussed plans with patient for ongoing care management follow up and provided patient with direct contact information for care management team; Advised patient, providing education and rationale, to monitor blood pressure daily and record, calling PCP for findings outside established parameters. The patient does not take his blood pressures at home. Review of the benefit of blood pressure checks;  Reviewed scheduled/upcoming provider appointments including: No upcoming visits with the pcp Advised patient to discuss changes in blood pressures or heart health with provider; Provided education on prescribed diet heart healthy. Review of monitoring sodium intake, the patient knows not to add salt to his dishes.;  Discussed complications of poorly controlled blood pressure such as heart disease, stroke, circulatory complications, vision complications, kidney  impairment, sexual dysfunction;  Screening for signs and symptoms of depression related to chronic disease state;  Assessed social determinant of health barriers;   Symptom Management: Take medications as prescribed   Attend all scheduled provider appointments Call pharmacy for medication refills 3-7 days in advance of running out of medications Call provider office for new concerns or questions  Work with the social worker to address care coordination needs and will continue to work with the clinical team to address health care and disease management related needs call the Suicide and Crisis Lifeline: 988 call the [place] National Suicide Prevention Lifeline: (205)010-2695 or TTY: 404 015 6696 TTY (337)481-6685) to talk to a trained counselor call 1-800-273-TALK (toll free, 24 hour hotline) if experiencing a Mental Health or Behavioral Health Crisis  learn about high blood pressure call doctor for signs and symptoms of high blood pressure develop an action plan for high blood pressure keep all doctor appointments take medications for blood pressure exactly as prescribed report new symptoms to your doctor eat more whole grains, fruits and vegetables, lean meats and healthy fats  Follow Up Plan: Telephone follow up appointment with care management team member scheduled for: 01-14-2023 at 345 pm           Our next appointment is by telephone on 01-14-2023 at 345 pm  Please call the care guide team at [phone] if you need to cancel or reschedule your appointment.   If you are experiencing a Mental Health or Behavioral Health Crisis or need someone to talk to, please call the Suicide and Crisis Lifeline: 988 call the [place] National Suicide Prevention Lifeline:  216-401-3139 or TTY: 431-422-5217 TTY (445)222-5761) to talk to a trained counselor call 1-800-273-TALK (toll free, 24 hour hotline)   Patient verbalizes understanding of instructions and care plan provided today and agrees to  view in MyChart. Active MyChart status and patient understanding of how to access instructions and care plan via MyChart confirmed with patient.       Alto Denver RN, MSN, CCM RN Care Manager  [facility]  Ambulatory Care Management  Direct Number: [phone]

## 2022-12-10 ENCOUNTER — Other Ambulatory Visit: Payer: Self-pay

## 2022-12-10 DIAGNOSIS — I6930 Unspecified sequelae of cerebral infarction: Secondary | ICD-10-CM

## 2022-12-10 MED ORDER — CLOPIDOGREL BISULFATE 75 MG PO TABS: 75.0000 mg | ORAL_TABLET | Freq: Every day | ORAL | 1 refills | Status: DC

## 2022-12-11 ENCOUNTER — Other Ambulatory Visit: Payer: Self-pay

## 2022-12-11 DIAGNOSIS — N138 Other obstructive and reflux uropathy: Secondary | ICD-10-CM

## 2022-12-11 DIAGNOSIS — E782 Mixed hyperlipidemia: Secondary | ICD-10-CM

## 2022-12-11 DIAGNOSIS — N183 Chronic kidney disease, stage 3 unspecified: Secondary | ICD-10-CM

## 2022-12-11 MED ORDER — ATORVASTATIN CALCIUM 40 MG PO TABS: 40.0000 mg | ORAL_TABLET | Freq: Every day | ORAL | 1 refills | Status: DC

## 2022-12-11 MED ORDER — LISINOPRIL 10 MG PO TABS: 10.0000 mg | ORAL_TABLET | Freq: Every day | ORAL | 1 refills | Status: DC

## 2022-12-11 MED ORDER — FINASTERIDE 5 MG PO TABS: 5.0000 mg | ORAL_TABLET | Freq: Every day | ORAL | 1 refills | Status: DC

## 2023-01-14 ENCOUNTER — Other Ambulatory Visit: Payer: Medicare HMO

## 2023-01-14 ENCOUNTER — Other Ambulatory Visit: Payer: Self-pay

## 2023-01-14 NOTE — Patient Outreach (Signed)
Care Management   Visit Note  01/14/2023 Name: [name] MRN: [id number] DOB: December 18, 1934  Subjective: [name] is a 88 y.o. year old male who is a primary care patient of [name], DO. The Care Management team was consulted for assistance.      Engaged with patient spoke with patient by telephone.    Goals Addressed             This Visit's Progress    RNCM Care Management Expected Outcome:  Monitor, Self-Manage and Reduce Symptoms of Stoke Ischemic/TIA       Current Barriers:  Knowledge Deficits related to memory loss and following the plan of care related to past CVA/TIA Care Coordination needs related to ongoing support and education needs  in a patient with history of CVA with memory loss. Chronic Disease Management support and education needs related to effective management of post CVA with memory loss  Lacks caregiver support.  Non-adherence to scheduled provider appointments Non-adherence to prescribed medication regimen Cognitive Deficits  Planned Interventions: Evaluation of current treatment plan related to effective management of past history of CVA and TIA with memory loss, especially short term memory loss and patient's adherence to plan as established by provider. The patient denies any acute changes. The patient can recall lots of information but short term memory is not good. The patient states he is doing great. He denies any acute changes in his conditions. He still  having issues with memory but nothing new. He states his wife is still in "prison". She has been in SNF for almost 6 months.  He has hired a Clinical research associate and is hoping to get her out. Today he was with his friend [name]. He states he feels he is doing very well overall with his health and well being. He states he is taking his medications and he doing what he is supposed to do. Wants to talk to Dr. [name] about some "prostate problems". He declined assistance with getting an appointment. He  states he will come by and talk to the office staff. Education provided.  [name] given information on how to get a life alert system and gave him the number to the [place] area. He also has the number to reach the [facility]. Education and support.  Advised patient to call the office for changes in mood, anxiety, depression, new questions and concerns related to past history of stroke with memory loss Provided education to patient re: calling for changes in his condition or new concerns related to his health and well being Reviewed medications with patient and discussed compliance. The patient is compliant with his medications. Praised the patient for taking his medications as directed. He confirms that he is still being compliant with medications. Provided patient with doing crossword puzzles, memory recall activities and creating routines  educational materials related to history of CVA with memory loss and effective management of deficits related to memory loss. Reviewed scheduled/upcoming provider appointments including. The patient has no upcoming appointments with the pcp. Education on calling for changes and needs. He states he will go by and get an appointment to see the pcp Discussed plans with patient for ongoing care management follow up and provided patient with direct contact information for care management team Advised patient to discuss changes in his memory  with provider  Symptom Management: Take medications as prescribed   Attend all scheduled provider appointments Perform all self care activities independently  Call provider office for new concerns or questions  call the Suicide and Crisis Lifeline: 988 call the [place] National Suicide Prevention Lifeline: 213-706-5955 or TTY: 8072789784 TTY 954 030 1942) to talk to a trained counselor call 1-800-273-TALK (toll free, 24 hour hotline) if experiencing a Mental Health or Behavioral Health Crisis   Follow Up Plan: Telephone follow  up appointment with care management team member scheduled for: [id number] at 345 pm        RNCM Care Management:  Expected Outcome:  Monitor, Self-Manage, and Reduce Symptoms of Hypertension       Current Barriers:  Knowledge Deficits related to effective management of HTN Care Coordination needs related to ongoing support and education needs  in a patient with HTN Chronic Disease Management support and education needs related to effective management of HTN Lacks caregiver support.  Non-adherence to scheduled provider appointments Non-adherence to prescribed medication regimen Cognitive Deficits  BP Readings from Last 3 Encounters:  11/16/22 (!) 155/79  11/16/22 (!) 157/116  09/02/22 112/62     Planned Interventions: Evaluation of current treatment plan related to hypertension self management and patient's adherence to plan as established by provider. The patient states he is doing well. He denies any acute changes in her HTN or heart health. His blood pressures have been a little elevated the last couple provider visits. The patient states he is taking his medications. He denies headaches or any other issues at this time with his HTN or heart health. Education and support given.  Provided education to patient re: stroke prevention, s/s of heart attack and stroke. Reviewed with the patient; Reviewed prescribed diet heart healthy diet. Education. The patient states he is eating well. He says sometimes he eats "too well". Education on monitoring for hidden sodium in his diet. Denies any changes in his dietary habits Reviewed medications with patient and discussed importance of compliance. The patient states compliance with his medications. The patient reports compliance with medications. Discussed plans with patient for ongoing care management follow up and provided patient with direct contact information for care management team; Advised patient, providing education and rationale, to  monitor blood pressure daily and record, calling PCP for findings outside established parameters. The patient does not take his blood pressures at home. Review of the benefit of blood pressure checks;  Reviewed scheduled/upcoming provider appointments including: No upcoming visits with the pcp Advised patient to discuss changes in blood pressures or heart health with provider; Provided education on prescribed diet heart healthy. Review of monitoring sodium intake, the patient knows not to add salt to his dishes.;  Discussed complications of poorly controlled blood pressure such as heart disease, stroke, circulatory complications, vision complications, kidney impairment, sexual dysfunction;  Screening for signs and symptoms of depression related to chronic disease state;  Assessed social determinant of health barriers;   Symptom Management: Take medications as prescribed   Attend all scheduled provider appointments Call pharmacy for medication refills 3-7 days in advance of running out of medications Call provider office for new concerns or questions  Work with the social worker to address care coordination needs and will continue to work with the clinical team to address health care and disease management related needs call the Suicide and Crisis Lifeline: 988 call the [place] National Suicide Prevention Lifeline: (979)534-5066 or TTY: (253)152-9722 TTY 414-673-9280) to talk to a trained counselor call 1-800-273-TALK (toll free, 24 hour hotline) if experiencing a Mental Health or Behavioral Health Crisis  learn about high blood pressure call doctor for signs and symptoms of high blood pressure develop an action  plan for high blood pressure keep all doctor appointments take medications for blood pressure exactly as prescribed report new symptoms to your doctor eat more whole grains, fruits and vegetables, lean meats and healthy fats  Follow Up Plan: Telephone follow up appointment with care  management team member scheduled for: 02-12-2023 at 345 pm           Consent to Services:  Patient was given information about care management services, agreed to services, and gave verbal consent to participate.   Plan: Telephone follow up appointment with care management team member scheduled for: 02-12-2023 at 345 pm  Alto Denver RN, MSN, CCM RN Care Manager  [facility]  Ambulatory Care Management  Direct Number: [phone]

## 2023-01-14 NOTE — Patient Instructions (Signed)
Visit Information  Thank you for taking time to visit with me today. Please don't hesitate to contact me if I can be of assistance to you before our next scheduled telephone appointment.  Following are the goals we discussed today:   Goals Addressed             This Visit's Progress    RNCM Care Management Expected Outcome:  Monitor, Self-Manage and Reduce Symptoms of Stoke Ischemic/TIA       Current Barriers:  Knowledge Deficits related to memory loss and following the plan of care related to past CVA/TIA Care Coordination needs related to ongoing support and education needs  in a patient with history of CVA with memory loss. Chronic Disease Management support and education needs related to effective management of post CVA with memory loss  Lacks caregiver support.  Non-adherence to scheduled provider appointments Non-adherence to prescribed medication regimen Cognitive Deficits  Planned Interventions: Evaluation of current treatment plan related to effective management of past history of CVA and TIA with memory loss, especially short term memory loss and patient's adherence to plan as established by provider. The patient denies any acute changes. The patient can recall lots of information but short term memory is not good. The patient states he is doing great. He denies any acute changes in his conditions. He still  having issues with memory but nothing new. He states his wife is still in "prison". She has been in SNF for almost 6 months.  He has hired a Clinical research associate and is hoping to get her out. Today he was with his friend [name]. He states he feels he is doing very well overall with his health and well being. He states he is taking his medications and he doing what he is supposed to do. Wants to talk to Dr. [name] about some "prostate problems". He declined assistance with getting an appointment. He states he will come by and talk to the office staff. Education provided.  [name] given information  on how to get a life alert system and gave him the number to the [place] area. He also has the number to reach the [facility]. Education and support.  Advised patient to call the office for changes in mood, anxiety, depression, new questions and concerns related to past history of stroke with memory loss Provided education to patient re: calling for changes in his condition or new concerns related to his health and well being Reviewed medications with patient and discussed compliance. The patient is compliant with his medications. Praised the patient for taking his medications as directed. He confirms that he is still being compliant with medications. Provided patient with doing crossword puzzles, memory recall activities and creating routines  educational materials related to history of CVA with memory loss and effective management of deficits related to memory loss. Reviewed scheduled/upcoming provider appointments including. The patient has no upcoming appointments with the pcp. Education on calling for changes and needs. He states he will go by and get an appointment to see the pcp Discussed plans with patient for ongoing care management follow up and provided patient with direct contact information for care management team Advised patient to discuss changes in his memory  with provider  Symptom Management: Take medications as prescribed   Attend all scheduled provider appointments Perform all self care activities independently  Call provider office for new concerns or questions  call the Suicide and Crisis Lifeline: 988 call the [place] National Suicide Prevention Lifeline: 951-205-0170 or TTY: 505-536-8563 TTY (  (314)710-4065) to talk to a trained counselor call 1-800-273-TALK (toll free, 24 hour hotline) if experiencing a Mental Health or Behavioral Health Crisis   Follow Up Plan: Telephone follow up appointment with care management team member scheduled for: [id number] at 345 pm         RNCM Care Management:  Expected Outcome:  Monitor, Self-Manage, and Reduce Symptoms of Hypertension       Current Barriers:  Knowledge Deficits related to effective management of HTN Care Coordination needs related to ongoing support and education needs  in a patient with HTN Chronic Disease Management support and education needs related to effective management of HTN Lacks caregiver support.  Non-adherence to scheduled provider appointments Non-adherence to prescribed medication regimen Cognitive Deficits  BP Readings from Last 3 Encounters:  11/16/22 (!) 155/79  11/16/22 (!) 157/116  09/02/22 112/62     Planned Interventions: Evaluation of current treatment plan related to hypertension self management and patient's adherence to plan as established by provider. The patient states he is doing well. He denies any acute changes in her HTN or heart health. His blood pressures have been a little elevated the last couple provider visits. The patient states he is taking his medications. He denies headaches or any other issues at this time with his HTN or heart health. Education and support given.  Provided education to patient re: stroke prevention, s/s of heart attack and stroke. Reviewed with the patient; Reviewed prescribed diet heart healthy diet. Education. The patient states he is eating well. He says sometimes he eats "too well". Education on monitoring for hidden sodium in his diet. Denies any changes in his dietary habits Reviewed medications with patient and discussed importance of compliance. The patient states compliance with his medications. The patient reports compliance with medications. Discussed plans with patient for ongoing care management follow up and provided patient with direct contact information for care management team; Advised patient, providing education and rationale, to monitor blood pressure daily and record, calling PCP for findings outside established parameters. The  patient does not take his blood pressures at home. Review of the benefit of blood pressure checks;  Reviewed scheduled/upcoming provider appointments including: No upcoming visits with the pcp Advised patient to discuss changes in blood pressures or heart health with provider; Provided education on prescribed diet heart healthy. Review of monitoring sodium intake, the patient knows not to add salt to his dishes.;  Discussed complications of poorly controlled blood pressure such as heart disease, stroke, circulatory complications, vision complications, kidney impairment, sexual dysfunction;  Screening for signs and symptoms of depression related to chronic disease state;  Assessed social determinant of health barriers;   Symptom Management: Take medications as prescribed   Attend all scheduled provider appointments Call pharmacy for medication refills 3-7 days in advance of running out of medications Call provider office for new concerns or questions  Work with the social worker to address care coordination needs and will continue to work with the clinical team to address health care and disease management related needs call the Suicide and Crisis Lifeline: 988 call the [place] National Suicide Prevention Lifeline: 615-243-2859 or TTY: 548-351-7719 TTY 4242343222) to talk to a trained counselor call 1-800-273-TALK (toll free, 24 hour hotline) if experiencing a Mental Health or Behavioral Health Crisis  learn about high blood pressure call doctor for signs and symptoms of high blood pressure develop an action plan for high blood pressure keep all doctor appointments take medications for blood pressure exactly as prescribed report new  symptoms to your doctor eat more whole grains, fruits and vegetables, lean meats and healthy fats  Follow Up Plan: Telephone follow up appointment with care management team member scheduled for: 02-12-2023 at 345 pm           Our next appointment is by  telephone on 02-12-2023 at 345 pm  Please call the care guide team at [phone] if you need to cancel or reschedule your appointment.   If you are experiencing a Mental Health or Behavioral Health Crisis or need someone to talk to, please call the Suicide and Crisis Lifeline: 988 call the [place] National Suicide Prevention Lifeline: (669)747-4950 or TTY: (786)818-2233 TTY 5183447196) to talk to a trained counselor call 1-800-273-TALK (toll free, 24 hour hotline)   Patient verbalizes understanding of instructions and care plan provided today and agrees to view in MyChart. Active MyChart status and patient understanding of how to access instructions and care plan via MyChart confirmed with patient.     Telephone follow up appointment with care management team member scheduled for: 02-12-2023 at 345 pm  Alto Denver RN, MSN, CCM RN Care Manager  [facility]  Ambulatory Care Management  Direct Number: [phone]

## 2023-02-12 ENCOUNTER — Other Ambulatory Visit: Payer: Self-pay

## 2023-02-12 ENCOUNTER — Telehealth: Payer: Self-pay | Admitting: *Deleted

## 2023-02-12 NOTE — Patient Outreach (Signed)
Care Management   Follow Up Note   02/12/2023 Name: [name] MRN: [id number] DOB: Dec 31, 1934   Referred by: [name], DO Reason for referral : Care Management (RNCM: Follow up for Chronic Disease Management & Care Coordination Needs Attempt)   An unsuccessful telephone outreach was attempted today. The patient was referred to the case management team for assistance with care management and care coordination.   Follow Up Plan: The care management team will reach out to the patient again over the next 30-60 days.   Danise Edge, BSN RN RN Care Manager  [facility]  Ambulatory Care Management  Direct Number: [phone]

## 2023-02-18 DIAGNOSIS — I10 Essential (primary) hypertension: Secondary | ICD-10-CM | POA: Diagnosis not present

## 2023-02-18 DIAGNOSIS — N183 Chronic kidney disease, stage 3 unspecified: Secondary | ICD-10-CM | POA: Diagnosis not present

## 2023-02-18 DIAGNOSIS — E669 Obesity, unspecified: Secondary | ICD-10-CM | POA: Diagnosis not present

## 2023-02-18 DIAGNOSIS — Z87891 Personal history of nicotine dependence: Secondary | ICD-10-CM | POA: Diagnosis not present

## 2023-02-18 DIAGNOSIS — E785 Hyperlipidemia, unspecified: Secondary | ICD-10-CM | POA: Diagnosis not present

## 2023-02-18 DIAGNOSIS — I129 Hypertensive chronic kidney disease with stage 1 through stage 4 chronic kidney disease, or unspecified chronic kidney disease: Secondary | ICD-10-CM | POA: Diagnosis not present

## 2023-02-19 ENCOUNTER — Ambulatory Visit (INDEPENDENT_AMBULATORY_CARE_PROVIDER_SITE_OTHER): Payer: PPO

## 2023-02-19 DIAGNOSIS — Z0000 Encounter for general adult medical examination without abnormal findings: Secondary | ICD-10-CM

## 2023-02-19 NOTE — Progress Notes (Signed)
Subjective:   [name] is a 88 y.o. male who presents for Medicare Annual/Subsequent preventive examination.  Visit Complete: Virtual I connected with  [name] on 02/19/23 by a audio enabled telemedicine application and verified that I am speaking with the correct person using two identifiers.  Patient Location: Home  Provider Location: Office/Clinic  I discussed the limitations of evaluation and management by telemedicine. The patient expressed understanding and agreed to proceed.  Vital Signs: Because this visit was a virtual/telehealth visit, some criteria may be missing or patient reported. Any vitals not documented were not able to be obtained and vitals that have been documented are patient reported.   Cardiac Risk Factors include: advanced age (>71men, >73 women);hypertension;dyslipidemia;male gender;sedentary lifestyle     Objective:    There were no vitals filed for this visit. There is no height or weight on file to calculate BMI.     02/19/2023    9:40 AM 11/16/2022    6:09 PM 06/23/2022   10:22 AM 06/22/2022    6:00 PM 06/22/2022   12:30 PM 01/21/2021   11:05 AM 06/03/2020   11:01 AM  Advanced Directives  Does Patient Have a Medical Advance Directive? No No Yes No No No No  Type of Advance Directive   Living will;Healthcare Power of Attorney      Does patient want to make changes to medical advance directive?   No - Patient declined      Copy of Healthcare Power of Attorney in Chart?   No - copy requested      Would patient like information on creating a medical advance directive? No - Patient declined No - Patient declined  No - Patient declined       Current Medications (verified) Outpatient Encounter Medications as of 02/19/2023  Medication Sig   acetaminophen (TYLENOL) 325 MG tablet Take 2 tablets (650 mg total) by mouth every 6 (six) hours as needed for mild pain, fever or headache (or Fever >/= 101).   aspirin EC 81 MG tablet Take 81 mg by mouth daily.  Swallow whole.   atorvastatin (LIPITOR) 40 MG tablet Take 1 tablet (40 mg total) by mouth daily.   clopidogrel (PLAVIX) 75 MG tablet Take 1 tablet (75 mg total) by mouth daily.   finasteride (PROSCAR) 5 MG tablet Take 1 tablet (5 mg total) by mouth daily.   lisinopril (ZESTRIL) 10 MG tablet Take 1 tablet (10 mg total) by mouth daily.   Multiple Vitamins-Minerals (MULTIVITAMIN ADULTS 50+ PO) Take by mouth.   Omega-3 Fatty Acids (FISH OIL) 1200 MG CAPS Take by mouth daily at 6 (six) AM.   polyethylene glycol (MIRALAX / GLYCOLAX) 17 g packet Take 17 g by mouth daily as needed for mild constipation.   No facility-administered encounter medications on file as of 02/19/2023.    Allergies (verified) Patient has no known allergies.   History: Past Medical History:  Diagnosis Date   Bowel obstruction (HCC)    BPH (benign prostatic hypertrophy)    Gastric outlet obstruction    High cholesterol    Hyperchloremia    Hypertension    Pancreatitis    Past Surgical History:  Procedure Laterality Date   ESOPHAGOGASTRODUODENOSCOPY N/A 12/03/2014   Procedure: ESOPHAGOGASTRODUODENOSCOPY (EGD);  Surgeon: Wallace Cullens, MD;  Location: [facility] ENDOSCOPY;  Service: Endoscopy;  Laterality: N/A;   ORIF ANKLE FRACTURE Left 06/23/2022   Procedure: OPEN REDUCTION INTERNAL FIXATION (ORIF) ANKLE FRACTURE;  Surgeon: Juanell Fairly, MD;  Location: [facility]  ORS;  Service: Orthopedics;  Laterality: Left;   Family History  Problem Relation Age of Onset   Heart disease Mother    Heart disease Father    Heart disease Brother    Social History   Socioeconomic History   Marital status: Married    Spouse name: Not on file   Number of children: Not on file   Years of education: Not on file   Highest education level: Not on file  Occupational History   Occupation: retired  Tobacco Use   Smoking status: Former    Current packs/day: 0.00    Types: Cigarettes    Quit date: 1985    Years since quitting: 39.8    Passive  exposure: Past   Smokeless tobacco: Former  Building services engineer status: Never Used  Substance and Sexual Activity   Alcohol use: No   Drug use: No   Sexual activity: Not Currently  Other Topics Concern   Not on file  Social History Narrative   Not on file   Social Determinants of Health   Financial Resource Strain: Low Risk  (02/19/2023)   Overall Financial Resource Strain (CARDIA)    Difficulty of Paying Living Expenses: Not hard at all  Food Insecurity: No Food Insecurity (02/19/2023)   Hunger Vital Sign    Worried About Running Out of Food in the Last Year: Never true    Ran Out of Food in the Last Year: Never true  Transportation Needs: No Transportation Needs (02/19/2023)   PRAPARE - Administrator, Civil Service (Medical): No    Lack of Transportation (Non-Medical): No  Physical Activity: Insufficiently Active (02/19/2023)   Exercise Vital Sign    Days of Exercise per Week: 2 days    Minutes of Exercise per Session: 30 min  Stress: No Stress Concern Present (02/19/2023)   [employer] of Occupational Health - Occupational Stress Questionnaire    Feeling of Stress : Only a little  Social Connections: Moderately Integrated (02/19/2023)   Social Connection and Isolation Panel [NHANES]    Frequency of Communication with Friends and Family: Twice a week    Frequency of Social Gatherings with Friends and Family: Once a week    Attends Religious Services: More than 4 times per year    Active Member of [employer] or Organizations: No    Attends Engineer, structural: Never    Marital Status: Married    Tobacco Counseling Counseling given: Not Answered   Clinical Intake:  Pre-visit preparation completed: Yes  Pain : No/denies pain     Nutritional Status: BMI > 30  Obese Nutritional Risks: None Diabetes: No  How often do you need to have someone help you when you read instructions, pamphlets, or other written materials from your doctor or pharmacy?:  1 - Never  Interpreter Needed?: No  Information entered by :: Kennedy Bucker, LPN   Activities of Daily Living    02/19/2023    9:42 AM 09/02/2022    2:53 PM  In your present state of health, do you have any difficulty performing the following activities:  Hearing? 0 0  Vision? 0 1  Difficulty concentrating or making decisions? 0 0  Walking or climbing stairs? 0 1  Dressing or bathing? 0 0  Doing errands, shopping? 0 1  Preparing Food and eating ? N   Using the Toilet? N   In the past six months, have you accidently leaked urine? N   Do you  have problems with loss of bowel control? N   Managing your Medications? N   Managing your Finances? N   Housekeeping or managing your Housekeeping? N     Patient Care Team: [name], DO as PCP - General (Family Medicine) Marlowe Sax, RN as Case Manager (General Practice) Ronney Asters, Jackelyn Poling, RPH-CPP as Pharmacist Bridgett Larsson, [name] as Social Worker (Licensed Clinical Social Worker)  Indicate any recent [employer] you may have received from other than [facility] providers in the past year (date may be approximate).     Assessment:   This is a routine wellness examination for [name]  Hearing/Vision screen Hearing Screening - Comments:: No aids Vision Screening - Comments:: Readers-    Goals Addressed             This Visit's Progress    DIET - EAT MORE FRUITS AND VEGETABLES         Depression Screen    02/19/2023    9:39 AM 09/02/2022    2:52 PM 05/11/2022    8:35 AM 02/16/2022    2:11 PM 11/24/2021    3:25 PM 11/11/2021    9:56 AM 01/21/2021   11:06 AM  PHQ 2/9 Scores  PHQ - 2 Score 0 2 3 0 0 0 0  PHQ- 9 Score 0 2 3  0 0     Fall Risk    02/19/2023    9:41 AM 09/02/2022    2:53 PM 05/11/2022    8:35 AM 05/05/2022    4:00 PM 02/16/2022    2:12 PM  Fall Risk   Falls in the past year? 1 1 0 0 0  Number falls in past yr: 0 0 0 0 0  Injury with Fall? 1 1 0 0 0  Risk for fall due to : History of  fall(s)  No Fall Risks No Fall Risks No Fall Risks  Follow up Falls prevention discussed;Falls evaluation completed  Falls evaluation completed Falls evaluation completed;Education provided Falls evaluation completed    MEDICARE RISK AT HOME: Medicare Risk at Home Any stairs in or around the home?: Yes If so, are there any without handrails?: No Home free of loose throw rugs in walkways, pet beds, electrical cords, etc?: Yes Adequate lighting in your home to reduce risk of falls?: Yes Life alert?: No Use of a cane, walker or w/c?: No Grab bars in the bathroom?: Yes Shower chair or bench in shower?: Yes Elevated toilet seat or a handicapped toilet?: No  TIMED UP AND GO:  Was the test performed?  No    Cognitive Function:        02/19/2023    9:44 AM 02/16/2022    2:13 PM 01/21/2021   11:10 AM 10/18/2018   11:27 AM 10/12/2017    2:45 PM  6CIT Screen  What Year? 0 points 0 points 0 points 0 points 0 points  What month? 0 points 0 points 0 points 0 points 0 points  What time? 3 points 0 points 0 points 0 points 0 points  Count back from 20 0 points 0 points 0 points 0 points 0 points  Months in reverse 4 points 0 points 2 points 0 points 0 points  Repeat phrase 2 points 4 points 4 points 0 points 2 points  Total Score 9 points 4 points 6 points 0 points 2 points    Immunizations Immunization History  Administered Date(s) Administered   Fluad Quad(high Dose 65+) 12/14/2018, 02/17/2021  Influenza, Seasonal, Injecte, Preservative Fre 02/07/2009   Influenza-Unspecified 02/02/2002, 02/04/2004, 01/19/2007, 01/06/2008, 01/18/2010, 12/20/2010, 12/20/2014   Pneumococcal Polysaccharide-23 02/27/2002, 08/31/2017   Td 02/27/2002   Td (Adult),unspecified 02/27/2002   Tdap 09/08/2017    TDAP status: Up to date  Flu Vaccine status: Declined, Education has been provided regarding the importance of this vaccine but patient still declined. Advised may receive this vaccine at local  pharmacy or Health Dept. Aware to provide a copy of the vaccination record if obtained from local pharmacy or Health Dept. Verbalized acceptance and understanding.  Pneumococcal vaccine status: Up to date  Covid-19 vaccine status: Declined, Education has been provided regarding the importance of this vaccine but patient still declined. Advised may receive this vaccine at local pharmacy or Health Dept.or vaccine clinic. Aware to provide a copy of the vaccination record if obtained from local pharmacy or Health Dept. Verbalized acceptance and understanding.  Qualifies for Shingles Vaccine? Yes   Zostavax completed No   Shingrix Completed?: No.    Education has been provided regarding the importance of this vaccine. Patient has been advised to call insurance company to determine out of pocket expense if they have not yet received this vaccine. Advised may also receive vaccine at local pharmacy or Health Dept. Verbalized acceptance and understanding.  Screening Tests Health Maintenance  Topic Date Due   COVID-19 Vaccine (1) Never done   Zoster Vaccines- Shingrix (1 of 2) Never done   Pneumonia Vaccine 13+ Years old (2 of 2 - PCV) 09/01/2018   INFLUENZA VACCINE  07/19/2023 (Originally 11/19/2022)   Medicare Annual Wellness (AWV)  02/19/2024   DTaP/Tdap/Td (4 - Td or Tdap) 09/09/2027   HPV VACCINES  Aged Out    Health Maintenance  Health Maintenance Due  Topic Date Due   COVID-19 Vaccine (1) Never done   Zoster Vaccines- Shingrix (1 of 2) Never done   Pneumonia Vaccine 60+ Years old (2 of 2 - PCV) 09/01/2018    Colorectal cancer screening: No longer required.   Lung Cancer Screening: (Low Dose CT Chest recommended if Age 65-80 years, 20 pack-year currently smoking OR have quit w/in 15years.) does not qualify.    Additional Screening:  Hepatitis C Screening: does not qualify; Completed no  Vision Screening: Recommended annual ophthalmology exams for early detection of glaucoma and  other disorders of the eye. Is the patient up to date with their annual eye exam?  No  Who is the provider or what is the name of the office in which the patient attends annual eye exams? No one If pt is not established with a provider, would they like to be referred to a provider to establish care? No .   Dental Screening: Recommended annual dental exams for proper oral hygiene   Community Resource Referral / Chronic Care Management: CRR required this visit?  No   CCM required this visit?  No     Plan:     I have personally reviewed and noted the following in the patient's chart:   Medical and social history Use of alcohol, tobacco or illicit drugs  Current medications and supplements including opioid prescriptions. Patient is not currently taking opioid prescriptions. Functional ability and status Nutritional status Physical activity Advanced directives List of other physicians Hospitalizations, surgeries, and ER visits in previous 12 months Vitals Screenings to include cognitive, depression, and falls Referrals and appointments  In addition, I have reviewed and discussed with patient certain preventive protocols, quality metrics, and best practice recommendations. A written  personalized care plan for preventive services as well as general preventive health recommendations were provided to patient.     Hal Hope, LPN   [id number]/12/[id number]   After Visit Summary: (MyChart) Due to this being a telephonic visit, the after visit summary with patients personalized plan was offered to patient via MyChart   Nurse Notes: none

## 2023-02-19 NOTE — Patient Instructions (Addendum)
[name] , Thank you for taking time to come for your Medicare Wellness Visit. I appreciate your ongoing commitment to your health goals. Please review the following plan we discussed and let me know if I can assist you in the future.   Referrals/Orders/Follow-Ups/Clinician Recommendations: none  This is a list of the screening recommended for you and due dates:  Health Maintenance  Topic Date Due   COVID-19 Vaccine (1) Never done   Zoster (Shingles) Vaccine (1 of 2) Never done   Pneumonia Vaccine (2 of 2 - PCV) 09/01/2018   Flu Shot  07/19/2023*   Medicare Annual Wellness Visit  02/19/2024   DTaP/Tdap/Td vaccine (4 - Td or Tdap) 09/09/2027   HPV Vaccine  Aged Out  *Topic was postponed. The date shown is not the original due date.    Advanced directives: (ACP Link)Information on Advanced Care Planning can be found at [facility] Directives Advance Health Care Directives ([email or web address]   Next Medicare Annual Wellness Visit scheduled for next year: Yes   02/25/24 @ 10:10 am by video

## 2023-02-26 ENCOUNTER — Telehealth: Payer: Self-pay | Admitting: Family Medicine

## 2023-02-26 DIAGNOSIS — I6930 Unspecified sequelae of cerebral infarction: Secondary | ICD-10-CM

## 2023-02-26 DIAGNOSIS — N183 Hypertensive chronic kidney disease with stage 1 through stage 4 chronic kidney disease, or unspecified chronic kidney disease: Secondary | ICD-10-CM

## 2023-02-26 DIAGNOSIS — N401 Enlarged prostate with lower urinary tract symptoms: Secondary | ICD-10-CM

## 2023-02-26 MED ORDER — FINASTERIDE 5 MG PO TABS: 5.0000 mg | ORAL_TABLET | Freq: Every day | ORAL | 1 refills | Status: DC

## 2023-02-26 MED ORDER — CLOPIDOGREL BISULFATE 75 MG PO TABS: 75.0000 mg | ORAL_TABLET | Freq: Every day | ORAL | 1 refills | Status: DC

## 2023-02-26 MED ORDER — LISINOPRIL 10 MG PO TABS: 10.0000 mg | ORAL_TABLET | Freq: Every day | ORAL | 1 refills | Status: DC

## 2023-02-26 NOTE — Addendum Note (Signed)
Addended by: Shirley Muscat on: 02/26/2023 12:52 PM   Modules accepted: Orders

## 2023-02-26 NOTE — Telephone Encounter (Signed)
Pt is requesting lisinopril 10MG , Finasteride 5MG , Clopidogrel 75 MG  [facility] [address]  [place]

## 2023-04-08 ENCOUNTER — Other Ambulatory Visit: Payer: PPO

## 2023-04-15 ENCOUNTER — Telehealth: Payer: Self-pay

## 2023-04-15 ENCOUNTER — Other Ambulatory Visit: Payer: Self-pay

## 2023-04-15 NOTE — Patient Outreach (Signed)
  Care Management   Outreach Note  04/15/2023 Name: [name] MRN: [id number] DOB: 06/13/1934  An unsuccessful outreach attempt was made today for a scheduled Care Management visit.    Follow Up Plan:  Phone rang multiple times without option to leave a voice message.   Katina [facility]  [facility], [facility] RN Care Manager Direct Dial: [phone] Website: [email or web address]

## 2023-05-06 DIAGNOSIS — M25562 Pain in left knee: Secondary | ICD-10-CM | POA: Diagnosis not present

## 2023-05-06 DIAGNOSIS — R262 Difficulty in walking, not elsewhere classified: Secondary | ICD-10-CM | POA: Diagnosis not present

## 2023-05-06 DIAGNOSIS — M1712 Unilateral primary osteoarthritis, left knee: Secondary | ICD-10-CM | POA: Diagnosis not present

## 2023-05-06 DIAGNOSIS — M25662 Stiffness of left knee, not elsewhere classified: Secondary | ICD-10-CM | POA: Diagnosis not present

## 2023-06-01 DIAGNOSIS — M1712 Unilateral primary osteoarthritis, left knee: Secondary | ICD-10-CM | POA: Diagnosis not present

## 2023-06-01 DIAGNOSIS — M25662 Stiffness of left knee, not elsewhere classified: Secondary | ICD-10-CM | POA: Diagnosis not present

## 2023-06-01 DIAGNOSIS — M25562 Pain in left knee: Secondary | ICD-10-CM | POA: Diagnosis not present

## 2023-06-01 DIAGNOSIS — R262 Difficulty in walking, not elsewhere classified: Secondary | ICD-10-CM | POA: Diagnosis not present

## 2023-06-08 DIAGNOSIS — M25662 Stiffness of left knee, not elsewhere classified: Secondary | ICD-10-CM | POA: Diagnosis not present

## 2023-06-08 DIAGNOSIS — M25562 Pain in left knee: Secondary | ICD-10-CM | POA: Diagnosis not present

## 2023-06-08 DIAGNOSIS — M1712 Unilateral primary osteoarthritis, left knee: Secondary | ICD-10-CM | POA: Diagnosis not present

## 2023-06-08 DIAGNOSIS — R262 Difficulty in walking, not elsewhere classified: Secondary | ICD-10-CM | POA: Diagnosis not present

## 2023-06-16 DIAGNOSIS — R262 Difficulty in walking, not elsewhere classified: Secondary | ICD-10-CM | POA: Diagnosis not present

## 2023-06-16 DIAGNOSIS — M1712 Unilateral primary osteoarthritis, left knee: Secondary | ICD-10-CM | POA: Diagnosis not present

## 2023-06-16 DIAGNOSIS — M25562 Pain in left knee: Secondary | ICD-10-CM | POA: Diagnosis not present

## 2023-06-16 DIAGNOSIS — M25662 Stiffness of left knee, not elsewhere classified: Secondary | ICD-10-CM | POA: Diagnosis not present

## 2023-06-23 DIAGNOSIS — R262 Difficulty in walking, not elsewhere classified: Secondary | ICD-10-CM | POA: Diagnosis not present

## 2023-06-23 DIAGNOSIS — M25662 Stiffness of left knee, not elsewhere classified: Secondary | ICD-10-CM | POA: Diagnosis not present

## 2023-06-23 DIAGNOSIS — M1712 Unilateral primary osteoarthritis, left knee: Secondary | ICD-10-CM | POA: Diagnosis not present

## 2023-06-23 DIAGNOSIS — M25562 Pain in left knee: Secondary | ICD-10-CM | POA: Diagnosis not present

## 2023-06-30 DIAGNOSIS — M25662 Stiffness of left knee, not elsewhere classified: Secondary | ICD-10-CM | POA: Diagnosis not present

## 2023-06-30 DIAGNOSIS — M25562 Pain in left knee: Secondary | ICD-10-CM | POA: Diagnosis not present

## 2023-06-30 DIAGNOSIS — M1712 Unilateral primary osteoarthritis, left knee: Secondary | ICD-10-CM | POA: Diagnosis not present

## 2023-06-30 DIAGNOSIS — R262 Difficulty in walking, not elsewhere classified: Secondary | ICD-10-CM | POA: Diagnosis not present

## 2023-07-09 ENCOUNTER — Other Ambulatory Visit: Payer: Self-pay

## 2023-07-09 ENCOUNTER — Encounter: Payer: Self-pay | Admitting: Family Medicine

## 2023-07-09 ENCOUNTER — Ambulatory Visit (INDEPENDENT_AMBULATORY_CARE_PROVIDER_SITE_OTHER): Admitting: Family Medicine

## 2023-07-09 VITALS — BP 138/74 | HR 72 | Ht 70.0 in | Wt 234.0 lb

## 2023-07-09 DIAGNOSIS — N401 Enlarged prostate with lower urinary tract symptoms: Secondary | ICD-10-CM | POA: Diagnosis not present

## 2023-07-09 DIAGNOSIS — Z87898 Personal history of other specified conditions: Secondary | ICD-10-CM | POA: Diagnosis not present

## 2023-07-09 DIAGNOSIS — Z23 Encounter for immunization: Secondary | ICD-10-CM

## 2023-07-09 DIAGNOSIS — I6930 Unspecified sequelae of cerebral infarction: Secondary | ICD-10-CM

## 2023-07-09 DIAGNOSIS — N183 Chronic kidney disease, stage 3 unspecified: Secondary | ICD-10-CM | POA: Diagnosis not present

## 2023-07-09 DIAGNOSIS — R7309 Other abnormal glucose: Secondary | ICD-10-CM

## 2023-07-09 DIAGNOSIS — E782 Mixed hyperlipidemia: Secondary | ICD-10-CM

## 2023-07-09 DIAGNOSIS — N138 Other obstructive and reflux uropathy: Secondary | ICD-10-CM

## 2023-07-09 DIAGNOSIS — I129 Hypertensive chronic kidney disease with stage 1 through stage 4 chronic kidney disease, or unspecified chronic kidney disease: Secondary | ICD-10-CM

## 2023-07-09 MED ORDER — CLOPIDOGREL BISULFATE 75 MG PO TABS: 75.0000 mg | ORAL_TABLET | Freq: Every day | ORAL | 1 refills | Status: AC

## 2023-07-09 MED ORDER — FINASTERIDE 5 MG PO TABS: 5.0000 mg | ORAL_TABLET | Freq: Every day | ORAL | 1 refills | Status: DC

## 2023-07-09 MED ORDER — LISINOPRIL 10 MG PO TABS: 10.0000 mg | ORAL_TABLET | Freq: Every day | ORAL | 1 refills | Status: DC

## 2023-07-09 MED ORDER — ATORVASTATIN CALCIUM 40 MG PO TABS: 40.0000 mg | ORAL_TABLET | Freq: Every day | ORAL | 1 refills | Status: DC

## 2023-07-09 NOTE — Patient Instructions (Addendum)
 Thank you for allowing the Care Management team to participate in your care. It was great speaking with you today!   Reminders: Your primary care provider placed a referral for evaluation by the Urology team. Please expect a call to schedule the initial appointment. We will follow up on July 20, 2023 at 1 pm.   Please do not hesitate to contact me if you require assistance prior to our next outreach.    Juanell Fairly [facility] Population Health RN Care Manager Direct Dial: [phone]  Fax: [phone] Website: [email or web address]

## 2023-07-09 NOTE — Patient Instructions (Addendum)
 Thank you for coming to the office today.  [facility] -1st floor [address] [place],  [place]  [address] Phone: [phone]  Referral sent back to Urologist  Juanell Fairly - nurse case manager will call you  Prevnar-20 pneumonia vaccine today  Labs today  Please schedule a Follow-up Appointment to: Return in about 6 months (around 01/09/2024) for 6 month follow-up .  If you have any other questions or concerns, please feel free to call the office or send a message through MyChart. You may also schedule an earlier appointment if necessary.  Additionally, you may be receiving a survey about your experience at our office within a few days to 1 week by e-mail or mail. We value your feedback.  Saralyn Pilar, DO [facility], [place]

## 2023-07-09 NOTE — Progress Notes (Signed)
 Subjective:    Patient ID: [name], male    DOB: 23-Jun-1934, 89 y.o.   MRN: [id number]  [name] is a 89 y.o. male presenting on 07/09/2023 for Benign Prostatic Hypertrophy   HPI  Discussed the use of AI scribe software for clinical note transcription with the patient, who gave verbal consent to proceed.  History of Present Illness   [name] is an 89 year old male who presents with urinary issues    CHRONIC HTN w/ CKD III Current Meds - Lisinopril 10mg  daily Reports good compliance, took meds today. Tolerating well, w/o complaints. Lifestyle: - Diet: balanced diet - Exercise: walking Denies CP, dyspnea, HA, edema, dizziness / lightheadedness   Hyperlipidemia History of CVA Cognitive Decline following stroke. Previuosly On Atorvastatin 40mg , ASA 81 and Plavix 75mg  was on DAPT for stroke secondary prevention. Needs help adhering to medicines.   Elevated PSA / BPH Nocturia Urinary Incontinence Followed by Urology [facility]  He has ongoing urinary issues, including difficulty urinating and a frequent urge to urinate without successful voiding. He urinates at least a couple of times a day, but not in large amounts. He recalls a previous urological evaluation approximately two years ago, which included a cystoscopy procedure.   He had Prostate MRI Has been on Finasteride for med management, had issues with med adherence with memory OFF Terazosin Still has nocturia and urinary frequency. Admits Erectile Dysfunction  Memory Loss Chronic gradual persistent problem, impacting his daily functions especially medication adherence. Note wife is now with dementia in a memory care facility   His wife has been in a memory care facility for almost a year, and he is not allowed to visit or call her. This situation has been distressing for him. 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 He lives next to his nephew, Gildardo Griffes, and has a friend named [name] who assists him occasionally.  Health Maintenance: Prevnar 20 today     07/09/2023   10:40 AM 02/19/2023    9:39 AM 09/02/2022    2:52 PM  Depression screen PHQ 2/9  Decreased Interest 0 0 2  Down, Depressed, Hopeless 0 0 0  PHQ - 2 Score 0 0 2  Altered sleeping 1 0 0  Tired, decreased energy 0 0 0  Change in appetite 0 0 0  Feeling bad or failure about yourself  0 0 0  Trouble concentrating 0 0 0  Moving slowly or fidgety/restless 0 0 0  Suicidal thoughts 0 0 0  PHQ-9 Score 1 0 2  Difficult doing work/chores  Not difficult at all        07/09/2023   10:41 AM 09/02/2022    2:53 PM 05/11/2022    8:36 AM 11/11/2021    9:57 AM  GAD 7 : Generalized Anxiety Score  Nervous, Anxious, on Edge 0 0 0 0  Control/stop worrying 0 0 0 0  Worry too much - different things 0 0 0 0  Trouble relaxing 0 0 0 0  Restless 0 0 0 0  Easily annoyed or irritable 0 0 0 0  Afraid - awful might happen 0 0 0 0  Total GAD 7 Score 0 0 0 0  Anxiety Difficulty Not difficult at all  Not difficult at all Not difficult at all    Social History   Tobacco Use   Smoking  status: Former    Current packs/day: 0.00    Types: Cigarettes    Quit date: 1985    Years since quitting: 40.2    Passive exposure: Past   Smokeless tobacco: Former  Building services engineer status: Never Used  Substance Use Topics   Alcohol use: No   Drug use: No    Review of Systems Per HPI unless specifically indicated above     Objective:    BP 138/74 (BP Location: Left Arm, Cuff Size: Normal)   Pulse 72   Ht 5\' 10"  (1.778 m)   Wt 234 lb (106.1 kg)   SpO2 98%   BMI 33.58 kg/m   Wt Readings from Last 3 Encounters:  07/09/23 234 lb (106.1 kg)  11/16/22 225 lb 1.4 oz (102.1 kg)  09/02/22 225 lb (102.1 kg)    Physical Exam Vitals and nursing note reviewed.  Constitutional:      General: He is not in acute distress.    Appearance: He is  well-developed. He is not diaphoretic.     Comments: Well-appearing, comfortable, cooperative  HENT:     Head: Normocephalic and atraumatic.  Eyes:     General:        Right eye: No discharge.        Left eye: No discharge.     Conjunctiva/sclera: Conjunctivae normal.  Neck:     Thyroid: No thyromegaly.  Cardiovascular:     Rate and Rhythm: Normal rate and regular rhythm.     Pulses: Normal pulses.     Heart sounds: Normal heart sounds. No murmur heard. Pulmonary:     Effort: Pulmonary effort is normal. No respiratory distress.     Breath sounds: Normal breath sounds. No wheezing or rales.  Musculoskeletal:        General: Normal range of motion.     Cervical back: Normal range of motion and neck supple.  Lymphadenopathy:     Cervical: No cervical adenopathy.  Skin:    General: Skin is warm and dry.     Findings: No erythema or rash.  Neurological:     Mental Status: He is alert and oriented to person, place, and time. Mental status is at baseline.  Psychiatric:        Behavior: Behavior normal.     Comments: Well groomed, good eye contact, normal speech and thoughts     Results for orders placed or performed during the hospital encounter of 11/16/22  Urinalysis, Routine w reflex microscopic -Urine, Catheterized   Collection Time: 11/16/22  6:46 PM  Result Value Ref Range   Color, Urine STRAW (A) YELLOW   APPearance CLEAR (A) CLEAR   Specific Gravity, Urine 1.009 1.005 - 1.030   pH 6.0 5.0 - 8.0   Glucose, UA NEGATIVE NEGATIVE mg/dL   Hgb urine dipstick NEGATIVE NEGATIVE   Bilirubin Urine NEGATIVE NEGATIVE   Ketones, ur NEGATIVE NEGATIVE mg/dL   Protein, ur NEGATIVE NEGATIVE mg/dL   Nitrite NEGATIVE NEGATIVE   Leukocytes,Ua NEGATIVE NEGATIVE      Assessment & Plan:   Problem List Items Addressed This Visit     Abnormal glucose   Relevant Orders   Hemoglobin A1c   Benign hypertension with CKD (chronic kidney disease) stage III (HCC) - Primary   Relevant  Medications   atorvastatin (LIPITOR) 40 MG tablet   lisinopril (ZESTRIL) 10 MG tablet   Other Relevant Orders   COMPLETE METABOLIC PANEL WITH GFR   CBC with Differential/Platelet  BPH with obstruction/lower urinary tract symptoms   Relevant Medications   finasteride (PROSCAR) 5 MG tablet   Other Relevant Orders   PSA   Ambulatory referral to Urology   History of cerebrovascular accident (CVA) with residual deficit   Relevant Medications   clopidogrel (PLAVIX) 75 MG tablet   Other Relevant Orders   COMPLETE METABOLIC PANEL WITH GFR   CBC with Differential/Platelet   HLD (hyperlipidemia)   Relevant Medications   atorvastatin (LIPITOR) 40 MG tablet   lisinopril (ZESTRIL) 10 MG tablet   Other Relevant Orders   Lipid panel   TSH   Other Visit Diagnoses       Need for Streptococcus pneumoniae vaccination       Relevant Orders   Pneumococcal conjugate vaccine 20-valent (Completed)     History of urinary retention       Relevant Orders   Ambulatory referral to Urology        BPH / History Urinary Retention Previously followed by Urology [facility], last 2023 Difficulty urinating with urge but ineffective voiding Already on treatment with finasteride 5mg  daily He remains OFF Alpha blocker Terazosin Additionally reports penile shrinkage asks about treatment for this. Does not seem to need actual erection function at this time, but he is asking about it. Refer to urologist for further evaluation and management.  Hypertension Repeat BP improved. Elevated blood pressure likely due to anxiety during visits. Continue Lisinopril 10mg  daily  History of CVA  General Health Maintenance Due for updated pneumonia vaccination. - Administer pneumonia vaccination.  Follow-up Regular follow-up every six months advised. - Schedule follow-up appointment in six months. - Coordinate with nurse case manager [facility] for follow-up care and appointments.       Orders Placed This Encounter   Procedures   Pneumococcal conjugate vaccine 20-valent   Lipid panel    Has the patient fasted?:   Yes   Hemoglobin A1c   COMPLETE METABOLIC PANEL WITH GFR   CBC with Differential/Platelet   PSA   TSH   Ambulatory referral to Urology    Referral Priority:   Routine    Referral Type:   Consultation    Referral Reason:   Specialty Services Required    Requested Specialty:   Urology    Number of Visits Requested:   1    Meds ordered this encounter  Medications   atorvastatin (LIPITOR) 40 MG tablet    Sig: Take 1 tablet (40 mg total) by mouth daily.    Dispense:  90 tablet    Refill:  1    Add future refills   clopidogrel (PLAVIX) 75 MG tablet    Sig: Take 1 tablet (75 mg total) by mouth daily.    Dispense:  90 tablet    Refill:  1    Add future refills   finasteride (PROSCAR) 5 MG tablet    Sig: Take 1 tablet (5 mg total) by mouth daily.    Dispense:  90 tablet    Refill:  1    Add future refills   lisinopril (ZESTRIL) 10 MG tablet    Sig: Take 1 tablet (10 mg total) by mouth daily.    Dispense:  90 tablet    Refill:  1    Add future refills    Follow up plan: Return in about 6 months (around 01/09/2024) for 6 month follow-up .   Saralyn Pilar, DO [facility] 07/09/2023, 10:13 AM

## 2023-07-09 NOTE — Patient Outreach (Signed)
 Care Management   Visit Note  07/09/2023 Name: [name] MRN: [id number] DOB: June 20, 1934  Subjective: [name] is a 89 y.o. year old male who is a primary care patient of [name], DO. Engaged with [name] via telephone.  Consent to Services:  [name] was given information about care management services, agreed to services, and gave verbal consent to participate.    Assessment:  Review of patient past medical history, allergies, medications, health status, including review of consultants reports, laboratory and other test data, was performed as part of  evaluation and provision of care management services.   Outpatient Encounter Medications as of 07/09/2023  Medication Sig   acetaminophen (TYLENOL) 325 MG tablet Take 2 tablets (650 mg total) by mouth every 6 (six) hours as needed for mild pain, fever or headache (or Fever >/= 101).   aspirin EC 81 MG tablet Take 81 mg by mouth daily. Swallow whole.   atorvastatin (LIPITOR) 40 MG tablet Take 1 tablet (40 mg total) by mouth daily.   clopidogrel (PLAVIX) 75 MG tablet Take 1 tablet (75 mg total) by mouth daily.   finasteride (PROSCAR) 5 MG tablet Take 1 tablet (5 mg total) by mouth daily.   lisinopril (ZESTRIL) 10 MG tablet Take 1 tablet (10 mg total) by mouth daily.   Multiple Vitamins-Minerals (MULTIVITAMIN ADULTS 50+ PO) Take by mouth.   Omega-3 Fatty Acids (FISH OIL) 1200 MG CAPS Take by mouth daily at 6 (six) AM.   polyethylene glycol (MIRALAX / GLYCOLAX) 17 g packet Take 17 g by mouth daily as needed for mild constipation.   No facility-administered encounter medications on file as of 07/09/2023.    Interventions:   Goals Addressed             This Visit's Progress    Effective Management of Chronic Conditions       Current Barriers:  Care Management support and education needs related to Effective Management of Chronic Conditions  Planned Interventions: Evaluation of current treatment plans,  self-management and patient's adherence to plan as established by provider. Reviewed medications and indications for use. Discussed importance of taking medications exactly as prescribed. Reports compliance with medications. Denies current concerns related to medication management or prescription cost. Provided information regarding established blood pressure parameters along with indications for notifying a provider. Advised to monitor BP a few times a week if unable to monitor daily and record readings. Reports not currently monitoring but confirmed having a blood pressure monitoring device. Will attempt to start monitoring next week. Reviewed exercise and activity tolerance. Reports being very active. He does not engage in coordinated exercises but reports walking often. 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  Provided safety and fall prevention measures. Denies recent falls. He has a history of a stroke. Notes he is now able to ambulate without an assistive device but takes breaks as needed. Reviewed symptoms. Denies chest pain or palpitations. Denies headaches, dizziness, or visual changes.  Discussed compliance with recommended cardiac prudent diet. Encouraged to read nutrition labels, monitor sodium intake, and avoid highly processed foods when possible.  Discussed pending outreach with the Urology team. A referral was placed by his primary care provider today. He is aware that the Urology team will call to schedule the initial outreach. Discussed importance of completing labs as ordered. Reviewed signs and symptoms of heart attack, stroke and worsening symptoms that require immediate medical attention.    BP Readings from  Last 3 Encounters:  07/09/23 138/74  11/16/22 (!) 155/79  11/16/22 (!) 157/116    Lab Results  Component Value Date   CHOL 136 07/09/2023   HDL 42 07/09/2023   LDLCALC 68 07/09/2023   TRIG 192 (H) 07/09/2023    CHOLHDL 3.2 07/09/2023     Lab Results  Component Value Date   BUN 25 07/09/2023    Lab Results  Component Value Date   CREATININE 1.12 07/09/2023   CREATININE 1.18 06/26/2022   CREATININE 0.96 06/26/2022      Interventions Today    Flowsheet Row Most Recent Value  Chronic Disease   Chronic disease during today's visit Hypertension (HTN), Chronic Kidney Disease/End Stage Renal Disease (ESRD), Other  [Hyperlipidemia, History of Stroke]  General Interventions   General Interventions Discussed/Reviewed General Interventions Discussed, Annual Eye Exam, Labs, Annual Foot Exam, Lipid Profile, [facility], Doctor Visits  Labs Kidney Function  Doctor Visits Discussed/Reviewed Doctor Visits Discussed, PCP, Specialist  PCP/Specialist Visits Compliance with follow-up visit  [Referal placed for evaluation by Urologist]  Exercise Interventions   Exercise Discussed/Reviewed Physical Activity  Physical Activity Discussed/Reviewed Physical Activity Discussed  Education Interventions   Education Provided Provided Education  Provided Verbal Education On Nutrition, Labs, Mental Health/Coping with Illness, Medication, When to see the doctor, Development worker, community  Labs Reviewed Kidney Function, Lipid Profile  Mental Health Interventions   Mental Health Discussed/Reviewed Mental Health Discussed, Other  [Reports some stress due to legal concerns with in-law regarding his spouse's care]  Nutrition Interventions   Nutrition Discussed/Reviewed Nutrition Discussed, Fluid intake, Portion sizes, Increasing proteins, Decreasing fats, Decreasing salt, Adding fruits and vegetables  Pharmacy Interventions   Pharmacy Dicussed/Reviewed Pharmacy Topics Discussed, Medications and their functions  Safety Interventions   Safety Discussed/Reviewed Safety Reviewed, Fall Risk  Advanced Directive Interventions   Advanced Directives Discussed/Reviewed Advanced Directives Discussed  [Will provide additional  information via patient instructions]               PLAN [name] agreed to follow up next month    Juanell Fairly [facility] RN Care Manager Direct Dial: [phone]  Fax: [phone] Website: [email or web address]

## 2023-07-10 LAB — HEMOGLOBIN A1C
Hgb A1c MFr Bld: 6.1 %{Hb} — ABNORMAL HIGH (ref <5.7)
Mean Plasma Glucose: 128 mg/dL
eAG (mmol/L): 7.1 mmol/L

## 2023-07-10 LAB — LIPID PANEL
Cholesterol: 136 mg/dL (ref <200)
HDL: 42 mg/dL (ref >=40)
LDL Cholesterol (Calc): 68 mg/dL
Non-HDL Cholesterol (Calc): 94 mg/dL (ref <130)
Total CHOL/HDL Ratio: 3.2 (calc) (ref <5.0)
Triglycerides: 192 mg/dL — ABNORMAL HIGH (ref <150)

## 2023-07-10 LAB — CBC WITH DIFFERENTIAL/PLATELET
Absolute Lymphocytes: 2157 {cells}/uL (ref 850–3900)
Absolute Monocytes: 792 {cells}/uL (ref 200–950)
Basophils Absolute: 27 {cells}/uL (ref 0–200)
Basophils Relative: 0.3 %
Eosinophils Absolute: 282 {cells}/uL (ref 15–500)
Eosinophils Relative: 3.1 %
HCT: 40.1 % (ref 38.5–50.0)
Hemoglobin: 13.2 g/dL (ref 13.2–17.1)
MCH: 32.8 pg (ref 27.0–33.0)
MCHC: 32.9 g/dL (ref 32.0–36.0)
MCV: 99.5 fL (ref 80.0–100.0)
MPV: 8.9 fL (ref 7.5–12.5)
Monocytes Relative: 8.7 %
Neutro Abs: 5842 {cells}/uL (ref 1500–7800)
Neutrophils Relative %: 64.2 %
Platelets: 208 10*3/uL (ref 140–400)
RBC: 4.03 10*6/uL — ABNORMAL LOW (ref 4.20–5.80)
RDW: 13.1 % (ref 11.0–15.0)
Total Lymphocyte: 23.7 %
WBC: 9.1 10*3/uL (ref 3.8–10.8)

## 2023-07-10 LAB — COMPLETE METABOLIC PANEL WITH GFR
AG Ratio: 1.4 (calc) (ref 1.0–2.5)
ALT: 14 U/L (ref 9–46)
AST: 19 U/L (ref 10–35)
Albumin: 4.2 g/dL (ref 3.6–5.1)
Alkaline phosphatase (APISO): 95 U/L (ref 35–144)
BUN: 25 mg/dL (ref 7–25)
CO2: 31 mmol/L (ref 20–32)
Calcium: 9.1 mg/dL (ref 8.6–10.3)
Chloride: 103 mmol/L (ref 98–110)
Creat: 1.12 mg/dL (ref 0.70–1.22)
Globulin: 2.9 g/dL (ref 1.9–3.7)
Glucose, Bld: 90 mg/dL (ref 65–139)
Potassium: 4.5 mmol/L (ref 3.5–5.3)
Sodium: 141 mmol/L (ref 135–146)
Total Bilirubin: 0.6 mg/dL (ref 0.2–1.2)
Total Protein: 7.1 g/dL (ref 6.1–8.1)

## 2023-07-10 LAB — PSA: PSA: 8.8 ng/mL — ABNORMAL HIGH (ref <=4.00)

## 2023-07-13 ENCOUNTER — Ambulatory Visit (INDEPENDENT_AMBULATORY_CARE_PROVIDER_SITE_OTHER): Admitting: Urology

## 2023-07-13 ENCOUNTER — Encounter: Payer: Self-pay | Admitting: Urology

## 2023-07-13 ENCOUNTER — Other Ambulatory Visit
Admission: RE | Admit: 2023-07-13 | Discharge: 2023-07-13 | Disposition: A | Discharge status: Home / Self Care | Attending: Urology | Admitting: Urology

## 2023-07-13 VITALS — BP 152/74 | HR 72 | Ht 70.0 in | Wt 234.0 lb

## 2023-07-13 DIAGNOSIS — R3129 Other microscopic hematuria: Secondary | ICD-10-CM | POA: Insufficient documentation

## 2023-07-13 DIAGNOSIS — R3121 Asymptomatic microscopic hematuria: Secondary | ICD-10-CM | POA: Insufficient documentation

## 2023-07-13 DIAGNOSIS — R399 Unspecified symptoms and signs involving the genitourinary system: Secondary | ICD-10-CM

## 2023-07-13 DIAGNOSIS — Z125 Encounter for screening for malignant neoplasm of prostate: Secondary | ICD-10-CM | POA: Diagnosis not present

## 2023-07-13 LAB — URINALYSIS, COMPLETE (UACMP) WITH MICROSCOPIC
Bilirubin Urine: NEGATIVE
Glucose, UA: NEGATIVE mg/dL
Hgb urine dipstick: NEGATIVE
Ketones, ur: NEGATIVE mg/dL
Leukocytes,Ua: NEGATIVE
Nitrite: NEGATIVE
Protein, ur: NEGATIVE mg/dL
Specific Gravity, Urine: 1.025 (ref 1.005–1.030)
pH: 6 (ref 5.0–8.0)

## 2023-07-13 LAB — BLADDER SCAN AMB NON-IMAGING: Scan Result: 80

## 2023-07-13 MED ORDER — TAMSULOSIN HCL 0.4 MG PO CAPS: 0.4000 mg | ORAL_CAPSULE | Freq: Every day | ORAL | 11 refills | Status: DC

## 2023-07-13 NOTE — Progress Notes (Signed)
   07/13/2023 10:26 AM   [name] [birth year] [id number]  Reason for visit: Follow up urinary symptoms, history of elevated PSA, history of asymptomatic microscopic hematuria  HPI: 89 year old male who has been followed long-term by [name], PA for the above issues.  He had a significantly elevated PSA of 24 in 2022 and a prostate MRI was ordered that showed no abnormal findings, and prostate volume was 60 g.  He was started on finasteride and PSA decreased appropriately.  Most recent PSA was 8.8 from March 2025.  He is well past the age of routine screening, and PSA decreased appropriately, with normal MRI would not recommend further screening.  He underwent a negative microscopic hematuria workup with me in May 2023, and has not followed up since that time.  He is currently on just finasteride for his urinary symptoms.  His primary complaint is urinary dribbling without sensation that requires a depends during the day.  He does not have any problems overnight.  He denies any urgency or urge incontinence.  He is a very challenging historian.  He was unable to void for urinalysis today, PVR normal at 80ml.  I think we need to have realistic expectations regarding his urinary symptoms.  We discussed avoiding bladder irritants, and I think is reasonable to trial Flomax.  Leakage without sensation is very challenging to manage in older patients.  Continue finasteride, start Flomax No further PSA screening needed RTC Shannon in 3 months PVR and symptom check Need to have realistic expectations  [name], MD  [facility] [address] [place], [place] [address] [phone]

## 2023-08-09 ENCOUNTER — Other Ambulatory Visit: Payer: Self-pay

## 2023-08-09 NOTE — Patient Outreach (Signed)
   Care Management   Outreach Note  08/09/2023 Name: [name] MRN: [id number] DOB: Feb 20, 1935  [name] was scheduled for routine nursing outreach today. Reports being away from his home and traveling/driving at the time of call. Outreach will be rescheduled.  Follow Up Plan:  Will complete outreach on Sep 06, 2023    Roxie Cord [facility] RN Care Manager Direct Dial: [phone]  Fax: [phone] Website: [email or web address]

## 2023-08-27 ENCOUNTER — Telehealth: Payer: Self-pay

## 2023-08-27 NOTE — Telephone Encounter (Signed)
 Spoke with patient, he has spoke with pharmacist before calling our office.

## 2023-08-27 NOTE — Telephone Encounter (Signed)
 I do not see any problem with him taking this but he may also want to check with the pharmacist

## 2023-08-27 NOTE — Telephone Encounter (Signed)
 Copied from CRM [id number]. Topic: Clinical - Medication Question >> Aug 27, 2023 12:12 PM Kevelyn M wrote: Reason for CRM: Patient is asking if it's ok for him to take Neuriva because his taking different medication. Please advise.

## 2023-09-06 ENCOUNTER — Other Ambulatory Visit: Payer: Self-pay

## 2023-09-06 NOTE — Patient Outreach (Unsigned)
 Complex Care Management   Visit Note  09/06/2023  Name:  [name] MRN: [id number] DOB: 08/27/1934  Situation: Referral received for Complex Care Management related to {Criteria:32550} I obtained verbal consent from {CHL AMB Patient/Caregiver:28184}.  Visit completed with ***  {VISIT LOCATION:32553}  Background:   Past Medical History:  Diagnosis Date   Bowel obstruction (HCC)    BPH (benign prostatic hypertrophy)    Gastric outlet obstruction    High cholesterol    Hyperchloremia    Hypertension    Pancreatitis     Assessment: Patient Reported Symptoms:  Cognitive        Neurological      HEENT        Cardiovascular      Respiratory      Endocrine      Gastrointestinal        Genitourinary      Integumentary      Musculoskeletal          Psychosocial              07/09/2023    1:54 PM  Depression screen PHQ 2/9  Decreased Interest 0  Down, Depressed, Hopeless 0  PHQ - 2 Score 0    There were no vitals filed for this visit.  Medications Reviewed Today     Reviewed by Roxie Cord, RN (Registered Nurse) on 09/06/23 at 1336  Med List Status: <None>   Medication Order Taking? Sig Documenting Provider Last Dose Status Informant  acetaminophen  (TYLENOL ) 325 MG tablet [id number] No Take 2 tablets (650 mg total) by mouth every 6 (six) hours as needed for mild pain, fever or headache (or Fever >/= 101). Darus Engels A, DO Taking Active   aspirin  EC 81 MG tablet [id number] No Take 81 mg by mouth daily. Swallow whole. [provider] Taking Active   atorvastatin  (LIPITOR) 40 MG tablet [id number] No Take 1 tablet (40 mg total) by mouth daily. Raina Bunting, DO Taking Active   clopidogrel  (PLAVIX ) 75 MG tablet [id number] No Take 1 tablet (75 mg total) by mouth daily. Raina Bunting, DO Taking Active   finasteride  (PROSCAR ) 5 MG tablet [id number] No Take 1 tablet (5 mg total) by mouth daily. Raina Bunting, DO  Taking Active   lisinopril  (ZESTRIL ) 10 MG tablet [id number] No Take 1 tablet (10 mg total) by mouth daily. Raina Bunting, DO Taking Active   Multiple Vitamins-Minerals (MULTIVITAMIN ADULTS 50+ PO) [id number] No Take by mouth. [provider] Taking Active Self  Omega-3 Fatty Acids (FISH OIL) 1200 MG CAPS [id number] No Take by mouth daily at 6 (six) AM. [provider] Taking Active   polyethylene glycol (MIRALAX  / GLYCOLAX ) 17 g packet [id number] No Take 17 g by mouth daily as needed for mild constipation. Raina Bunting, DO Taking Active   tamsulosin  (FLOMAX ) 0.4 MG CAPS capsule [id number]  Take 1 capsule (0.4 mg total) by mouth daily. Lawerence Pressman, MD  Active             Recommendation:   {RECOMMENDATONS:32554}  Follow Up Plan:   {FOLLOWUP:32559}  SIG ***

## 2023-09-06 NOTE — Patient Instructions (Signed)
Thank you for allowing the Chronic Care Management team to participate in your care.  

## 2023-10-14 ENCOUNTER — Other Ambulatory Visit: Payer: Self-pay

## 2023-10-17 NOTE — Patient Outreach (Signed)
 Complex Care Management   Visit Note    Name:  [name] MRN: [id number] DOB: 06-Feb-1935  Situation: Referral received for Complex Care Management related to HTN, HLD and CKD. I obtained verbal consent from Patient.  Visit completed with [name] via telephone.  Background:   Past Medical History:  Diagnosis Date   Bowel obstruction (HCC)    BPH (benign prostatic hypertrophy)    Gastric outlet obstruction    High cholesterol    Hyperchloremia    Hypertension    Pancreatitis     Assessment: Patient Reported Symptoms: Cognitive Cognitive Status: Alert and oriented to person, place, and time, Normal speech and language skills  Neurological Neurological Review of Symptoms: No symptoms reported  HEENT HEENT Symptoms Reported: No symptoms reported  Cardiovascular Cardiovascular Symptoms Reported: No symptoms reported Does patient have uncontrolled Hypertension?: No  Respiratory Respiratory Symptoms Reported: No symptoms reported  Endocrine Endocrine Symptoms Reported: No symptoms reported Is patient diabetic?: No  Gastrointestinal Gastrointestinal Symptoms Reported: No symptoms reported  Genitourinary Genitourinary Symptoms Reported: No symptoms reported  Integumentary Integumentary Symptoms Reported: No symptoms reported  Musculoskeletal Musculoskelatal Symptoms Reviewed: No symptoms reported  Psychosocial Psychosocial Symptoms Reported: No symptoms reported Quality of Family Relationships: stressful (Reports spouse was placed in a facility without his consent. Reports currently pending legal action.) Do you feel physically threatened by others?: No      10/14/2023    6:21 PM  Depression screen PHQ 2/9  Decreased Interest 0  Down, Depressed, Hopeless 0  PHQ - 2 Score 0    There were no vitals filed for this visit.  Medications Reviewed Today     Reviewed by Karoline Lima, RN (Registered Nurse) on 10/14/23 at 1550  Med List Status: <None>   Medication Order Taking?  Sig Documenting Provider Last Dose Status Informant  acetaminophen  (TYLENOL ) 325 MG tablet [id number] No Take 2 tablets (650 mg total) by mouth every 6 (six) hours as needed for mild pain, fever or headache (or Fever >/= 101). Fausto Sor A, DO Taking Active   aspirin  EC 81 MG tablet [id number] No Take 81 mg by mouth daily. Swallow whole. [provider] Taking Active   atorvastatin  (LIPITOR) 40 MG tablet [id number] No Take 1 tablet (40 mg total) by mouth daily. Edman Marsa PARAS, DO Taking Active   clopidogrel  (PLAVIX ) 75 MG tablet [id number] No Take 1 tablet (75 mg total) by mouth daily. Edman Marsa PARAS, DO Taking Active   finasteride  (PROSCAR ) 5 MG tablet [id number] No Take 1 tablet (5 mg total) by mouth daily. Edman Marsa PARAS, DO Taking Active   lisinopril  (ZESTRIL ) 10 MG tablet [id number] No Take 1 tablet (10 mg total) by mouth daily. Edman Marsa PARAS, DO Taking Active   Multiple Vitamins-Minerals (MULTIVITAMIN ADULTS 50+ PO) [id number] No Take by mouth. [provider] Taking Active Self  Omega-3 Fatty Acids (FISH OIL) 1200 MG CAPS [id number] No Take by mouth daily at 6 (six) AM. [provider] Taking Active   polyethylene glycol (MIRALAX  / GLYCOLAX ) 17 g packet [id number] No Take 17 g by mouth daily as needed for mild constipation. Edman Marsa PARAS, DO Taking Active   tamsulosin  (FLOMAX ) 0.4 MG CAPS capsule [id number]  Take 1 capsule (0.4 mg total) by mouth daily. Francisca Redell BROCKS, MD  Active             Recommendation:   Continue Current Plan of Care  Follow Up Plan:   Telephone follow up appointment with Nurse Case  Manager on November 11, 2023   Jackson Acron [facility] RN Care Manager Direct Dial: [phone]  Fax: [phone] Website: [email or web address]

## 2023-10-17 NOTE — Progress Notes (Deleted)
 10/18/2023 3:04 PM   [name] November 12, 1934 [id number]  Referring provider: Edman Marsa PARAS, DO [address] [place],  [place] [address]  Urological history: 1. Elevated PSA  - PSA (06/2023) 8.80 - prostate MRI (08/2020) - negative for high risk disease  2. BPH with LU TS - prostate MRI (08/2020) - multiple bladder saccules and a small anterior diverticulum  - cysto (08/2021) - moderate sized prostate w lateral lobe hypertrophy, no median lobe, moderate bladder trabeculations and small diverticulum at the dome  - finasteride  5 mg daily   3. High risk hematuria - former smoker - CTU (2023) - NED - cysto (2023) - no malignancies   No chief complaint on file.  HPI: [name] is a 89 y.o. man who presents today for three month follow up for I PSS, PVR after a trial of tamsulosin  for urinary dribbling.   Previous records reviewed.   IPSS score: *** PVR: ***   Previous score: 21/5   Previous PVR: 80 mL    Major complaint(s):  x *** years. Denies any dysuria, hematuria or suprapubic pain.   Currently taking: ***.  He has had ***.   Denies any recent fevers, chills, nausea or vomiting.  Former smoker.    Serum creatinine (06/2023) 1.12  PSA (06/2023) 8.80  Hbg A1c (06/3033) 6.1        Score:  1-7 Mild 8-19 Moderate 20-35 Severe    PMH: Past Medical History:  Diagnosis Date   Bowel obstruction (HCC)    BPH (benign prostatic hypertrophy)    Gastric outlet obstruction    High cholesterol    Hyperchloremia    Hypertension    Pancreatitis     Surgical History: Past Surgical History:  Procedure Laterality Date   ESOPHAGOGASTRODUODENOSCOPY N/A 12/03/2014   Procedure: ESOPHAGOGASTRODUODENOSCOPY (EGD);  Surgeon: Deward CINDERELLA Piedmont, MD;  Location: [facility] ENDOSCOPY;  Service: Endoscopy;  Laterality: N/A;   ORIF ANKLE FRACTURE Left 06/23/2022   Procedure: OPEN REDUCTION INTERNAL FIXATION (ORIF) ANKLE FRACTURE;  Surgeon: Marchia Drivers, MD;  Location: [facility]  ORS;  Service: Orthopedics;  Laterality: Left;    Home Medications:  Allergies as of 10/18/2023   No Known Allergies      Medication List        Accurate as of October 17, 2023  3:04 PM. If you have any questions, ask your nurse or doctor.          acetaminophen  325 MG tablet Commonly known as: TYLENOL  Take 2 tablets (650 mg total) by mouth every 6 (six) hours as needed for mild pain, fever or headache (or Fever >/= 101).   aspirin  EC 81 MG tablet Take 81 mg by mouth daily. Swallow whole.   atorvastatin  40 MG tablet Commonly known as: LIPITOR Take 1 tablet (40 mg total) by mouth daily.   clopidogrel  75 MG tablet Commonly known as: PLAVIX  Take 1 tablet (75 mg total) by mouth daily.   finasteride  5 MG tablet Commonly known as: PROSCAR  Take 1 tablet (5 mg total) by mouth daily.   Fish Oil 1200 MG Caps Take by mouth daily at 6 (six) AM.   lisinopril  10 MG tablet Commonly known as: ZESTRIL  Take 1 tablet (10 mg total) by mouth daily.   MULTIVITAMIN ADULTS 50+ PO Take by mouth.   polyethylene glycol 17 g packet Commonly known as: MIRALAX  / GLYCOLAX  Take 17 g by mouth daily as needed for mild constipation.   tamsulosin  0.4 MG Caps capsule Commonly  known as: FLOMAX  Take 1 capsule (0.4 mg total) by mouth daily.        Allergies: No Known Allergies  Family History: Family History  Problem Relation Age of Onset   Heart disease Mother    Heart disease Father    Heart disease Brother     Social History: See HPI for pertinent social history  ROS: Pertinent ROS in HPI  Physical Exam: There were no vitals taken for this visit.  Constitutional:  Well nourished. Alert and oriented, No acute distress. HEENT:  AT, moist mucus membranes.  Trachea midline, no masses. Cardiovascular: No clubbing, cyanosis, or edema. Respiratory: Normal respiratory effort, no increased work of breathing. GI: Abdomen is soft, non tender, non distended, no abdominal masses. Liver  and spleen not palpable.  No hernias appreciated.  Stool sample for occult testing is not indicated.   GU: No CVA tenderness.  No bladder fullness or masses.  Patient with circumcised/uncircumcised phallus. ***Foreskin easily retracted***  Urethral meatus is patent.  No penile discharge. No penile lesions or rashes. Scrotum without lesions, cysts, rashes and/or edema.  Testicles are located scrotally bilaterally. No masses are appreciated in the testicles. Left and right epididymis are normal. Rectal: Patient with  normal sphincter tone. Anus and perineum without scarring or rashes. No rectal masses are appreciated. Prostate is approximately *** grams, *** nodules are appreciated. Seminal vesicles are normal. Skin: No rashes, bruises or suspicious lesions. Lymph: No cervical or inguinal adenopathy. Neurologic: Grossly intact, no focal deficits, moving all 4 extremities. Psychiatric: Normal mood and affect.  Laboratory Data: See EPIC and HPI  I have reviewed the labs.   Pertinent Imaging: ***  Assessment & Plan:  ***  1. BPH with LU TS - stable, improving, worsening mild, moderate severe symptoms *** - no signs of retention, infection or malignancy *** - aged out of screening - UA benign *** - PVR < 300 cc *** - most bothersome symptoms are *** - encouraged avoiding bladder irritants, fluid restriction before bedtime and timed voiding's - continue tamsulosin  0.4 mg daily and finasteride  5 mg daily - educated on red flag symptoms: acute retention, gross hematuria, fever, severe pain - advised to call clinic or go to the ED if these occur - return to clinic in *** symptom re-evaluation ***  2. High risk hematuria -former smoker - work up in 2023 - BPH - no reports of gross heme  3. Elevated PSA - reduced appropriately on finasteride  - aged out of screening      No follow-ups on file.  These notes generated with voice recognition software. I apologize for typographical  errors.  [name]  [facility] [address]  [address] [place], [place] [address] [phone]

## 2023-10-17 NOTE — Patient Instructions (Addendum)
 Thank you for allowing the Complex Care Management team to participate in your care. It was great speaking with you!  We will follow up on October 12, 2023 at 3:15pm Please do not hesitate to contact me if you require assistance prior to our next outreach.    Jackson Acron [facility] RN Care Manager Direct Dial: [phone]  Fax: [phone] Website: [email or web address]

## 2023-10-18 ENCOUNTER — Ambulatory Visit: Admitting: Urology

## 2023-10-18 DIAGNOSIS — R972 Elevated prostate specific antigen [PSA]: Secondary | ICD-10-CM

## 2023-10-18 DIAGNOSIS — R399 Unspecified symptoms and signs involving the genitourinary system: Secondary | ICD-10-CM

## 2023-10-18 DIAGNOSIS — R319 Hematuria, unspecified: Secondary | ICD-10-CM

## 2023-10-18 DIAGNOSIS — N401 Enlarged prostate with lower urinary tract symptoms: Secondary | ICD-10-CM

## 2023-11-11 ENCOUNTER — Telehealth: Payer: Self-pay

## 2023-11-11 NOTE — Patient Instructions (Signed)
 Chananya D Swaim - I am sorry I was unable to reach you today for our scheduled appointment. I work with Edman, Marsa PARAS, DO and am calling to support your healthcare needs. Please contact me at your earliest convenience. I look forward to speaking with you soon.   Thank you,    Jackson Acron [facility] RN Care Manager Direct Dial: [phone]  Fax: [phone] Website: [email or web address]

## 2023-12-02 NOTE — Progress Notes (Signed)
 Pharmacy Quality Measure Review  This patient is appearing on a report for being at risk of failing the adherence measure for cholesterol (statin) medications this calendar year.   Medication: atorvastatin  40 mg daily Last fill date: 11/20/2023 for 90 day supply  Insurance report was not up to date. No action needed at this time.   Woodie Jock, PharmD PGY1 Pharmacy Resident

## 2023-12-29 ENCOUNTER — Other Ambulatory Visit: Payer: Self-pay

## 2023-12-31 NOTE — Patient Outreach (Signed)
 Please disregard encounter: [name] answered call at scheduled time but reports being unable to complete outreach. Agreeable to follow up later this month.

## 2024-01-11 ENCOUNTER — Ambulatory Visit (INDEPENDENT_AMBULATORY_CARE_PROVIDER_SITE_OTHER): Admitting: Family Medicine

## 2024-01-11 ENCOUNTER — Encounter: Payer: Self-pay | Admitting: Family Medicine

## 2024-01-11 ENCOUNTER — Ambulatory Visit: Admitting: Family Medicine

## 2024-01-11 ENCOUNTER — Ambulatory Visit: Payer: Self-pay

## 2024-01-11 VITALS — BP 134/80 | HR 74 | Ht 70.0 in | Wt 241.0 lb

## 2024-01-11 DIAGNOSIS — N138 Other obstructive and reflux uropathy: Secondary | ICD-10-CM | POA: Diagnosis not present

## 2024-01-11 DIAGNOSIS — H9212 Otorrhea, left ear: Secondary | ICD-10-CM | POA: Diagnosis not present

## 2024-01-11 DIAGNOSIS — Z91199 Patient's noncompliance with other medical treatment and regimen due to unspecified reason: Secondary | ICD-10-CM

## 2024-01-11 DIAGNOSIS — G5793 Unspecified mononeuropathy of bilateral lower limbs: Secondary | ICD-10-CM | POA: Diagnosis not present

## 2024-01-11 DIAGNOSIS — L602 Onychogryphosis: Secondary | ICD-10-CM | POA: Diagnosis not present

## 2024-01-11 DIAGNOSIS — N401 Enlarged prostate with lower urinary tract symptoms: Secondary | ICD-10-CM | POA: Diagnosis not present

## 2024-01-11 DIAGNOSIS — R600 Localized edema: Secondary | ICD-10-CM | POA: Diagnosis not present

## 2024-01-11 NOTE — Patient Instructions (Addendum)
 Thank you for coming to the office today.  Likely symptoms in feet are from neuropathy and swelling  Referral to the Foot doctor  [facility] Address: [address], [place], [place] [address] Hours: Open 8AM-5PM Phone: [phone]  Recommend purchasing Compression Socks at [facility]  Mild Pressure 10-26mmHg compression, wear during day and then elevate legs in evening.  Use RICE therapy: - R - Rest / relative rest with activity modification avoid overuse of joint - I - Ice packs (make sure you use a towel or sock / something to protect skin) - C - Compression with socks to apply pressure and reduce swelling allowing more support - E - Elevation - if significant swelling, lift leg above heart level (toes above your nose) to help reduce swelling, most helpful at night after day of being on your feet   Please schedule a Follow-up Appointment to: Return if symptoms worsen or fail to improve.  If you have any other questions or concerns, please feel free to call the office or send a message through MyChart. You may also schedule an earlier appointment if necessary.  Additionally, you may be receiving a survey about your experience at our office within a few days to 1 week by e-mail or mail. We value your feedback.  Marsa Officer, DO [facility], [place]

## 2024-01-11 NOTE — Telephone Encounter (Signed)
 I will see him this afternoon. He did no show us  this morning already once.  Marsa Officer, DO [facility] 01/11/2024, 1:16 PM

## 2024-01-11 NOTE — Telephone Encounter (Signed)
 FYI Only or Action Required?: FYI only for provider.  Patient was last seen in primary care on 07/09/2023 by Edman Marsa PARAS, DO.  Called Nurse Triage reporting Tingling.  Symptoms began several years ago.  Interventions attempted: Nothing.  Symptoms are: gradually worsening.  Triage Disposition: See PCP When Office is Open (Within 3 Days)  Patient/caregiver understands and will follow disposition?:  Reason for Disposition  [1] Numbness or tingling in one or both feet AND [2] is a chronic symptom (recurrent or ongoing AND present > 4 weeks)  Answer Assessment - Initial Assessment Questions Patient is asking to be evaluated for neuropathy, states he has not mentioned it in the past because he didn't want to be a bother. States he cannot reach his feet anymore. Scheduled same day OV. Patient states he was not aware that he had an appointment this morning.  When asked patient about skin color of feet, patient states they are very dirty as he has been unable to reach them to wash them and using a brush was unsuccessful. May benefit from [facility] or other services for assistance with hygiene.   1. SYMPTOM: What is the main symptom you are concerned about? (e.g., weakness, numbness)     Burning/tingling in his feet  2. ONSET: When did this start? (e.g., minutes, hours, days; while sleeping)     Several years  3. LAST NORMAL: When was the last time you (the patient) were normal (no symptoms)?     Several years  4. PATTERN Does this come and go, or has it been constant since it started?  Is it present now?     Constant  Protocols used: Neurologic [facility] Copied from CRM #[id number]. Topic: Clinical - Red Word Triage >> Jan 11, 2024 12:48 PM [name] wrote: Red Word that prompted transfer to Nurse Triage: tingle/burning on feet

## 2024-01-11 NOTE — Progress Notes (Signed)
 Appointment re-scheduled. See visit later today 01/11/24

## 2024-01-11 NOTE — Progress Notes (Signed)
 Subjective:    Patient ID: TROOPER OLANDER, male    DOB: [birth year], 89 y.o.   MRN: [id number]  CAIRO AGOSTINELLI is a 89 y.o. male presenting on 01/11/2024 for Foot Swelling (Bilateral )  Patient presents for a same day appointment.  Note he missed his earlier apt today for routine follow-up. He was unaware of apt and called to schedule acute visit.  HPI  Discussed the use of AI scribe software for clinical note transcription with the patient, who gave verbal consent to proceed.  History of Present Illness   ORIEN MAYHALL is an 89 year old male who presents with foot burning and swelling.  Bilateral foot peripheral neuropathy / dysesthesias and edema - Burning and swelling in both feet, with sensations of cold and heat - Symptoms began after an ankle fracture in 2024, which required surgery and rehabilitation - Initially mild, now progressed to more frequent tingling and temperature changes, though not constant - Symptoms subside at night when in bed - Difficulty reaching feet due to limited mobility, impacting ability to provide foot care, including cleaning and toenail trimming  BPH / Urinary incontinence and voiding difficulty Followed by Urology [facility] - Requires use of a diaper to prevent urinary accidents - Difficulty urinating while sitting - Follow-up appointment scheduled for October 6th for further evaluation  Left otorrhea - Persistent drainage of fluid from the left ear - Tried over-the-counter swimmer's ear product without resolution of symptoms      10/14/2023    6:21 PM 09/06/2023    1:30 PM 07/09/2023    1:54 PM  Depression screen PHQ 2/9  Decreased Interest 0 0 0  Down, Depressed, Hopeless 0 0 0  PHQ - 2 Score 0 0 0       09/06/2023    1:30 PM 07/09/2023   10:41 AM 09/02/2022    2:53 PM 05/11/2022    8:36 AM  GAD 7 : Generalized Anxiety Score  Nervous, Anxious, on Edge 0 0 0 0  Control/stop worrying 0 0 0 0  Worry too much - different things 0 0 0 0  Trouble  relaxing 0 0 0 0  Restless 0 0 0 0  Easily annoyed or irritable 0 0 0 0  Afraid - awful might happen 0 0 0 0  Total GAD 7 Score 0 0 0 0  Anxiety Difficulty Not difficult at all Not difficult at all  Not difficult at all    Social History   Tobacco Use   Smoking status: Former    Current packs/day: 0.00    Types: Cigarettes    Quit date: 1985    Years since quitting: 40.7    Passive exposure: Past   Smokeless tobacco: Former  Building services engineer status: Never Used  Substance Use Topics   Alcohol use: No   Drug use: No    Review of Systems Per HPI unless specifically indicated above     Objective:    BP 134/80 (BP Location: Left Arm, Patient Position: Sitting, Cuff Size: Normal)   Pulse 74   Ht 5' 10 (1.778 m)   Wt 241 lb (109.3 kg)   SpO2 95%   BMI 34.58 kg/m   Wt Readings from Last 3 Encounters:  01/11/24 241 lb (109.3 kg)  07/13/23 234 lb (106.1 kg)  07/09/23 234 lb (106.1 kg)    Physical Exam Vitals and nursing note reviewed.  Constitutional:      General: He is not  in acute distress.    Appearance: Normal appearance. He is well-developed. He is not diaphoretic.     Comments: Well-appearing, comfortable, cooperative  HENT:     Head: Normocephalic and atraumatic.  Eyes:     General:        Right eye: No discharge.        Left eye: No discharge.     Conjunctiva/sclera: Conjunctivae normal.  Cardiovascular:     Rate and Rhythm: Normal rate.  Pulmonary:     Effort: Pulmonary effort is normal.  Musculoskeletal:     Right lower leg: Edema (bilateral lower ext edema +2 pitting foot ankle and legm varicose veins) present.     Left lower leg: Edema present.  Skin:    General: Skin is warm and dry.     Findings: No erythema or rash.     Comments: Overgrown toenails, feet have dirty appearance on soles, no ulceration  Neurological:     Mental Status: He is alert and oriented to person, place, and time.  Psychiatric:        Mood and Affect: Mood normal.         Behavior: Behavior normal.        Thought Content: Thought content normal.     Comments: Well groomed, good eye contact, normal speech and thoughts     Results for orders placed or performed during the hospital encounter of 07/13/23  Urinalysis, Complete w Microscopic -   Collection Time: 07/13/23 12:50 PM  Result Value Ref Range   Color, Urine YELLOW YELLOW   APPearance CLEAR CLEAR   Specific Gravity, Urine 1.025 1.005 - 1.030   pH 6.0 5.0 - 8.0   Glucose, UA NEGATIVE NEGATIVE mg/dL   Hgb urine dipstick NEGATIVE NEGATIVE   Bilirubin Urine NEGATIVE NEGATIVE   Ketones, ur NEGATIVE NEGATIVE mg/dL   Protein, ur NEGATIVE NEGATIVE mg/dL   Nitrite NEGATIVE NEGATIVE   Leukocytes,Ua NEGATIVE NEGATIVE   Squamous Epithelial / HPF 0-5 0 - 5 /HPF   WBC, UA 0-5 0 - 5 WBC/hpf   RBC / HPF 0-5 0 - 5 RBC/hpf   Bacteria, UA FEW (A) NONE SEEN   Mucus PRESENT       Assessment & Plan:   Problem List Items Addressed This Visit     Bilateral lower extremity edema   BPH with obstruction/lower urinary tract symptoms   Other Visit Diagnoses       Neuropathy involving both lower extremities    -  Primary   Relevant Orders   Ambulatory referral to Podiatry     Overgrown toenails       Relevant Orders   Ambulatory referral to Podiatry     Otorrhea of left ear            Peripheral neuropathy and edema of bilateral feet Chronic neuropathy and edema with intermittent symptoms, worsens at night. Likely due to nerve interference from swelling and past injury. Sensation loss noted. Prefers non-pharmacological management. - Refer to podiatrist for toenail, nerve, and circulation management. - Advise medical-grade compression stockings for swelling - RICE therapy - Instruct to elevate legs to reduce edema.  - Coordinate with Case Management [facility] nurse for future access to home aid or other programs to help with hygiene and foot care assistance.  Urinary incontinence BPH and Chronic  incontinence managed with adult diapers. Difficulty with urination positioning. He already has upcoming apt 01/24/24 - Remind of urology appointment on October 6th. Reminder printed  Left ear  drainage No other symptoms. Trial of swimmers ear drops inadequate Does not have ENT May warrant referral if symptoms persist    Orders Placed This Encounter  Procedures   Ambulatory referral to Podiatry    Referral Priority:   Routine    Referral Type:   Consultation    Referral Reason:   Specialty Services Required    Requested Specialty:   Podiatry    Number of Visits Requested:   1    No orders of the defined types were placed in this encounter.   Follow up plan: Return if symptoms worsen or fail to improve.   Marsa Officer, DO [facility] 01/11/2024, 3:20 PM

## 2024-01-13 ENCOUNTER — Telehealth: Payer: Self-pay

## 2024-01-18 ENCOUNTER — Ambulatory Visit (INDEPENDENT_AMBULATORY_CARE_PROVIDER_SITE_OTHER): Payer: Self-pay | Admitting: Podiatry

## 2024-01-18 DIAGNOSIS — M79674 Pain in right toe(s): Secondary | ICD-10-CM

## 2024-01-18 DIAGNOSIS — M79675 Pain in left toe(s): Secondary | ICD-10-CM | POA: Diagnosis not present

## 2024-01-18 DIAGNOSIS — G629 Polyneuropathy, unspecified: Secondary | ICD-10-CM | POA: Diagnosis not present

## 2024-01-18 DIAGNOSIS — B351 Tinea unguium: Secondary | ICD-10-CM | POA: Diagnosis not present

## 2024-01-18 DIAGNOSIS — Q666 Other congenital valgus deformities of feet: Secondary | ICD-10-CM

## 2024-01-18 NOTE — Progress Notes (Signed)
  Subjective:  Patient ID: [name], male    DOB: December 22, 1934,  MRN: [id number]  Chief Complaint  Patient presents with   Toe Pain    NP (Re-Establishing. Pt last seen 07/25/2019). Toenail Examination/Evaluation and Trim. [facility].   89 y.o. male returns for the above complaint. Patient presents with thickened elongated Shogry mycotic toenails x 10 mild pain on palpation worse with ambulation and shoe pressure he would like to have me debride down.  He has secondary complaint of neuropathy pain to both of his feet.  He does not know what is coming from he may think his lower back denies any other acute issues he would also like to discuss orthotics option as well as he walks a lot on his feet  Objective:  There were no vitals filed for this visit. Podiatric Exam: Vascular: dorsalis pedis and posterior tibial pulses are palpable bilateral. Capillary return is immediate. Temperature gradient is WNL. Skin turgor WNL  Sensorium: Normal Semmes Weinstein monofilament test. Normal tactile sensation bilaterally. Nail Exam: Pt has thick disfigured discolored nails with subungual debris noted bilateral entire nail hallux through fifth toenails.  Pain on palpation to the nails. Ulcer Exam: There is no evidence of ulcer or pre-ulcerative changes or infection. Orthopedic Exam: Muscle tone and strength are WNL. No limitations in general ROM. No crepitus or effusions noted.  Skin: No Porokeratosis. No infection or ulcers    Assessment & Plan:   1. Neuropathy   2. Pes planovalgus   3. Pain due to onychomycosis of toenails of both feet     Patient was evaluated and treated and all questions answered.  Bilateral neuropathy secondary to unknown etiology - All questions and concerns were discussed with the patient in extensive detail given the presence of neuropathy patient will benefit from seeing Dr. Lazarus for further evaluation and management. Referral referral was placed  Pes planovalgus/foot  deformity -I explained to patient the etiology of pes planovalgus and relationship with heel pain/arch pain and various treatment options were discussed.  Given patient foot structure in the setting of heel pain/arch pain I believe patient will benefit from custom-made orthotics to help control the hindfoot motion support the arch of the foot and take the stress away from arches.  Patient agrees with the plan like to proceed with orthotics -Patient was casted for orthotics   Onychomycosis with pain  -Nails palliatively debrided as below. -Educated on self-care  Procedure: Nail Debridement Rationale: pain  Type of Debridement: manual, sharp debridement. Instrumentation: Nail nipper, rotary burr. Number of Nails: 10  Procedures and Treatment: Consent by patient was obtained for treatment procedures. The patient understood the discussion of treatment and procedures well. All questions were answered thoroughly reviewed. Debridement of mycotic and hypertrophic toenails, 1 through 5 bilateral and clearing of subungual debris. No ulceration, no infection noted.  Return Visit-Office Procedure: Patient instructed to return to the office for a follow up visit 3 months for continued evaluation and treatment.  Franky Blanch, DPM    No follow-ups on file.

## 2024-01-23 NOTE — Progress Notes (Deleted)
 01/24/2024 3:44 PM   [name] 19-Jun-1934 [id number]  Referring provider: Edman Marsa PARAS, DO [address] [place],  [place] [address]  Urological history: 1. Elevated PSA - PSA (06/2023)  8.80 ~  17.6 - prostate MRI (2023) no evidence of high grade carcinoma  2. BPH with LU TS - prostate volume (MRI 2023) 59 cc - cysto (2023) moderate sized prostate w/ lateral lobe hypertrophy, moderate bladder trabeculations, small diverticulum at dome - terazosin  4 mg daily; discontinued  - finasteride  5 mg daily   3. High risk hematuria - former smoker - CTU (2023) NED - cysto (2023) NED   No chief complaint on file.  HPI: [name] is a 89 y.o. man who presents today for 61-month follow-up after trial of tamsulosin  0.4 mg daily  Previous records reviewed.  He was seen by Dr. Francisca on March 25th, 2025 for urinary dribbling without sensation that requires depends during the day.  He does not have any problems overnight.  He was not experiencing any urgency or urge incontinence.  At that time, he was just on finasteride  5 mg daily.  He was then started on tamsulosin  0.4 mg daily in addition to the finasteride  and scheduled for follow-up in 3 months.  I PSS ***  He reports sensation of incomplete bladder emptying, urinary frequency, urinary intermittency, urinary urgency, a weak urinary stream, having to strain to void, nocturia x ***, leaking before being able to reach the restroom, leaking with coughing, leaking without awareness, and post void dribbling.     He is wearing *** pads//depends  daily.    Patient denies any modifying or aggravating factors.  Patient denies any recent UTI's, gross hematuria, dysuria or suprapubic/flank pain.  Patient denies any fevers, chills, nausea or vomiting.  ***  He has a family history of PCa, colon cancer, ovarian cancer and/or breast cancer with ***.   He does not have a family history of PCa, colon cancer, ovarian cancer, and/or breast  cancer .***     UA (06/2023) unremarkable  PVR***  PSA (06/2023)  8.80  ` 17.6  Serum creatinine (06/2023) 1.12  Hemoglobin A1c (06/2023) 6.1  Diuretics:  ***  Fluid consumptiom: ***  PMH: Past Medical History:  Diagnosis Date   Bowel obstruction (HCC)    BPH (benign prostatic hypertrophy)    Gastric outlet obstruction    High cholesterol    Hyperchloremia    Hypertension    Pancreatitis     Surgical History: Past Surgical History:  Procedure Laterality Date   ESOPHAGOGASTRODUODENOSCOPY N/A 12/03/2014   Procedure: ESOPHAGOGASTRODUODENOSCOPY (EGD);  Surgeon: Deward CINDERELLA Piedmont, MD;  Location: [facility] ENDOSCOPY;  Service: Endoscopy;  Laterality: N/A;   ORIF ANKLE FRACTURE Left 06/23/2022   Procedure: OPEN REDUCTION INTERNAL FIXATION (ORIF) ANKLE FRACTURE;  Surgeon: Marchia Drivers, MD;  Location: [facility] ORS;  Service: Orthopedics;  Laterality: Left;    Home Medications:  Allergies as of 01/24/2024   No Known Allergies      Medication List        Accurate as of January 23, 2024  3:44 PM. If you have any questions, ask your nurse or doctor.          acetaminophen  325 MG tablet Commonly known as: TYLENOL  Take 2 tablets (650 mg total) by mouth every 6 (six) hours as needed for mild pain, fever or headache (or Fever >/= 101).   aspirin  EC 81 MG tablet Take 81 mg by mouth daily. Swallow whole.  atorvastatin  40 MG tablet Commonly known as: LIPITOR Take 1 tablet (40 mg total) by mouth daily.   clopidogrel  75 MG tablet Commonly known as: PLAVIX  Take 1 tablet (75 mg total) by mouth daily.   finasteride  5 MG tablet Commonly known as: PROSCAR  Take 1 tablet (5 mg total) by mouth daily.   Fish Oil 1200 MG Caps Take by mouth daily at 6 (six) AM.   lisinopril  10 MG tablet Commonly known as: ZESTRIL  Take 1 tablet (10 mg total) by mouth daily.   MULTIVITAMIN ADULTS 50+ PO Take by mouth.   polyethylene glycol 17 g packet Commonly known as: MIRALAX  / GLYCOLAX  Take 17 g by  mouth daily as needed for mild constipation.   tamsulosin  0.4 MG Caps capsule Commonly known as: FLOMAX  Take 1 capsule (0.4 mg total) by mouth daily.        Allergies: No Known Allergies  Family History: Family History  Problem Relation Age of Onset   Heart disease Mother    Heart disease Father    Heart disease Brother     Social History:  reports that he quit smoking about 40 years ago. His smoking use included cigarettes. He has been exposed to tobacco smoke. He has quit using smokeless tobacco. He reports that he does not drink alcohol and does not use drugs.  ROS: Pertinent ROS in HPI  Physical Exam: There were no vitals taken for this visit.  Constitutional:  Well nourished. Alert and oriented, No acute distress. HEENT: [place] AT, moist mucus membranes.  Trachea midline, no masses. Cardiovascular: No clubbing, cyanosis, or edema. Respiratory: Normal respiratory effort, no increased work of breathing. GI: Abdomen is soft, non tender, non distended, no abdominal masses. Liver and spleen not palpable.  No hernias appreciated.  Stool sample for occult testing is not indicated.   GU: No CVA tenderness.  No bladder fullness or masses.  Patient with circumcised/uncircumcised phallus. ***Foreskin easily retracted***  Urethral meatus is patent.  No penile discharge. No penile lesions or rashes. Scrotum without lesions, cysts, rashes and/or edema.  Testicles are located scrotally bilaterally. No masses are appreciated in the testicles. Left and right epididymis are normal. Rectal: Patient with  normal sphincter tone. Anus and perineum without scarring or rashes. No rectal masses are appreciated. Prostate is approximately *** grams, *** nodules are appreciated. Seminal vesicles are normal. Skin: No rashes, bruises or suspicious lesions. Lymph: No cervical or inguinal adenopathy. Neurologic: Grossly intact, no focal deficits, moving all 4 extremities. Psychiatric: Normal mood and  affect.  Laboratory Data: See EPIC and HPI I have reviewed the labs.   Pertinent Imaging: ***  Assessment & Plan:  ***  BPH with LU TS - stable, improving, worsening mild, moderate severe symptoms *** - no signs of retention, infection or malignancy *** - PSA up to date  - DRE benign *** - UA benign  - PVR < 300 cc *** - most bothersome symptoms are *** - encouraged avoiding bladder irritants, fluid restriction before bedtime and timed voiding's - Initiate alpha-blocker (***), discussed side effects *** - Initiate 5 alpha reductase inhibitor (***), discussed side effects *** - Continue tamsulosin  0.4 mg daily, alfuzosin 10 mg daily, Rapaflo 8 mg daily, terazosin , doxazosin, Cialis 5 mg daily and finasteride  5 mg daily, dutasteride 0.5 mg daily***:refills given - Cannot tolerate medication or medication failure, schedule cystoscopy *** - educated on red flag symptoms: acute retention, gross hematuria, fever, severe pain - advised to call clinic or go to the ED if  these occur - return to clinic in *** symptom re-evaluation ***     No follow-ups on file.  These notes generated with voice recognition software. I apologize for typographical errors.  CLOTILDA HELON RIGGERS  [facility] [address]  [address] [place], [place] [address] [phone]

## 2024-01-24 ENCOUNTER — Ambulatory Visit: Admitting: Urology

## 2024-01-24 DIAGNOSIS — N138 Other obstructive and reflux uropathy: Secondary | ICD-10-CM

## 2024-01-27 NOTE — Progress Notes (Unsigned)
 01/28/2024 9:26 PM   [name] Aug 23, 1934 [id number]  Referring provider: Edman Marsa PARAS, DO [address] [place],  [place] [address]  Urological history: 1. Elevated PSA - PSA (06/2023)  8.80 ~  17.6 - prostate MRI (2023) no evidence of high grade carcinoma  2. BPH with LU TS - prostate volume (MRI 2023) 59 cc - cysto (2023) moderate sized prostate w/ lateral lobe hypertrophy, moderate bladder trabeculations, small diverticulum at dome - terazosin  4 mg daily; discontinued  - finasteride  5 mg daily   3. High risk hematuria - former smoker - CTU (2023) NED - cysto (2023) NED   No chief complaint on file.  HPI: [name] is a 89 y.o. man who presents today for 33-month follow-up after trial of tamsulosin  0.4 mg daily  Previous records reviewed.  He was seen by Dr. Francisca on March 25th, 2025 for urinary dribbling without sensation that requires depends during the day.  He does not have any problems overnight.  He was not experiencing any urgency or urge incontinence.  At that time, he was just on finasteride  5 mg daily.  He was then started on tamsulosin  0.4 mg daily in addition to the finasteride  and scheduled for follow-up in 3 months.  I PSS ***  He reports sensation of incomplete bladder emptying, urinary frequency, urinary intermittency, urinary urgency, a weak urinary stream, having to strain to void, nocturia x ***, leaking before being able to reach the restroom, leaking with coughing, leaking without awareness, and post void dribbling.     He is wearing *** pads//depends  daily.    Patient denies any modifying or aggravating factors.  Patient denies any recent UTI's, gross hematuria, dysuria or suprapubic/flank pain.  Patient denies any fevers, chills, nausea or vomiting.  ***  He has a family history of PCa, colon cancer, ovarian cancer and/or breast cancer with ***.   He does not have a family history of PCa, colon cancer, ovarian cancer, and/or breast  cancer .***     UA (06/2023) unremarkable  PVR***  PSA (06/2023)  8.80  ` 17.6  Serum creatinine (06/2023) 1.12  Hemoglobin A1c (06/2023) 6.1  Diuretics:  ***  Fluid consumptiom: ***  PMH: Past Medical History:  Diagnosis Date   Bowel obstruction (HCC)    BPH (benign prostatic hypertrophy)    Gastric outlet obstruction    High cholesterol    Hyperchloremia    Hypertension    Pancreatitis     Surgical History: Past Surgical History:  Procedure Laterality Date   ESOPHAGOGASTRODUODENOSCOPY N/A 12/03/2014   Procedure: ESOPHAGOGASTRODUODENOSCOPY (EGD);  Surgeon: Deward CINDERELLA Piedmont, MD;  Location[facility] ENDOSCOPY;  Service: Endoscopy;  Laterality: N/A;   ORIF ANKLE FRACTURE Left 06/23/2022   Procedure: OPEN REDUCTION INTERNAL FIXATION (ORIF) ANKLE FRACTURE;  Surgeon: Marchia Drivers, MD;  Location: [facility] ORS;  Service: Orthopedics;  Laterality: Left;    Home Medications:  Allergies as of 01/28/2024   No Known Allergies      Medication List        Accurate as of January 27, 2024  9:26 PM. If you have any questions, ask your nurse or doctor.          acetaminophen  325 MG tablet Commonly known as: TYLENOL  Take 2 tablets (650 mg total) by mouth every 6 (six) hours as needed for mild pain, fever or headache (or Fever >/= 101).   aspirin  EC 81 MG tablet Take 81 mg by mouth daily. Swallow whole.  atorvastatin  40 MG tablet Commonly known as: LIPITOR Take 1 tablet (40 mg total) by mouth daily.   clopidogrel  75 MG tablet Commonly known as: PLAVIX  Take 1 tablet (75 mg total) by mouth daily.   finasteride  5 MG tablet Commonly known as: PROSCAR  Take 1 tablet (5 mg total) by mouth daily.   Fish Oil 1200 MG Caps Take by mouth daily at 6 (six) AM.   lisinopril  10 MG tablet Commonly known as: ZESTRIL  Take 1 tablet (10 mg total) by mouth daily.   MULTIVITAMIN ADULTS 50+ PO Take by mouth.   polyethylene glycol 17 g packet Commonly known as: MIRALAX  / GLYCOLAX  Take 17 g  by mouth daily as needed for mild constipation.   tamsulosin  0.4 MG Caps capsule Commonly known as: FLOMAX  Take 1 capsule (0.4 mg total) by mouth daily.        Allergies: No Known Allergies  Family History: Family History  Problem Relation Age of Onset   Heart disease Mother    Heart disease Father    Heart disease Brother     Social History:  reports that he quit smoking about 40 years ago. His smoking use included cigarettes. He has been exposed to tobacco smoke. He has quit using smokeless tobacco. He reports that he does not drink alcohol and does not use drugs.  ROS: Pertinent ROS in HPI  Physical Exam: There were no vitals taken for this visit.  Constitutional:  Well nourished. Alert and oriented, No acute distress. HEENT: [place] AT, moist mucus membranes.  Trachea midline, no masses. Cardiovascular: No clubbing, cyanosis, or edema. Respiratory: Normal respiratory effort, no increased work of breathing. GI: Abdomen is soft, non tender, non distended, no abdominal masses. Liver and spleen not palpable.  No hernias appreciated.  Stool sample for occult testing is not indicated.   GU: No CVA tenderness.  No bladder fullness or masses.  Patient with circumcised/uncircumcised phallus. ***Foreskin easily retracted***  Urethral meatus is patent.  No penile discharge. No penile lesions or rashes. Scrotum without lesions, cysts, rashes and/or edema.  Testicles are located scrotally bilaterally. No masses are appreciated in the testicles. Left and right epididymis are normal. Rectal: Patient with  normal sphincter tone. Anus and perineum without scarring or rashes. No rectal masses are appreciated. Prostate is approximately *** grams, *** nodules are appreciated. Seminal vesicles are normal. Skin: No rashes, bruises or suspicious lesions. Lymph: No cervical or inguinal adenopathy. Neurologic: Grossly intact, no focal deficits, moving all 4 extremities. Psychiatric: Normal mood and  affect.  Laboratory Data: See EPIC and HPI I have reviewed the labs.   Pertinent Imaging: ***  Assessment & Plan:  ***  BPH with LU TS - stable, improving, worsening mild, moderate severe symptoms *** - no signs of retention, infection or malignancy *** - PSA up to date  - DRE benign *** - UA benign  - PVR < 300 cc *** - most bothersome symptoms are *** - encouraged avoiding bladder irritants, fluid restriction before bedtime and timed voiding's - Initiate alpha-blocker (***), discussed side effects *** - Initiate 5 alpha reductase inhibitor (***), discussed side effects *** - Continue tamsulosin  0.4 mg daily, alfuzosin 10 mg daily, Rapaflo 8 mg daily, terazosin , doxazosin, Cialis 5 mg daily and finasteride  5 mg daily, dutasteride 0.5 mg daily***:refills given - Cannot tolerate medication or medication failure, schedule cystoscopy *** - educated on red flag symptoms: acute retention, gross hematuria, fever, severe pain - advised to call clinic or go to the ED if  these occur - return to clinic in *** symptom re-evaluation ***     No follow-ups on file.  These notes generated with voice recognition software. I apologize for typographical errors.  CLOTILDA HELON RIGGERS  [facility] [address]  [address] [place], [place] [address] [phone]

## 2024-01-28 ENCOUNTER — Encounter: Payer: Self-pay | Admitting: Urology

## 2024-01-28 ENCOUNTER — Ambulatory Visit (INDEPENDENT_AMBULATORY_CARE_PROVIDER_SITE_OTHER): Admitting: Urology

## 2024-01-28 VITALS — BP 129/84 | HR 71 | Ht 70.0 in | Wt 225.0 lb

## 2024-01-28 DIAGNOSIS — R399 Unspecified symptoms and signs involving the genitourinary system: Secondary | ICD-10-CM

## 2024-01-28 LAB — BLADDER SCAN AMB NON-IMAGING

## 2024-02-24 ENCOUNTER — Encounter (INDEPENDENT_AMBULATORY_CARE_PROVIDER_SITE_OTHER): Payer: Self-pay | Admitting: Family Medicine

## 2024-02-24 ENCOUNTER — Ambulatory Visit (INDEPENDENT_AMBULATORY_CARE_PROVIDER_SITE_OTHER): Admitting: Family Medicine

## 2024-02-24 VITALS — BP 144/76 | HR 61 | Temp 97.8°F | Ht 68.5 in | Wt 242.0 lb

## 2024-02-24 DIAGNOSIS — I69311 Memory deficit following cerebral infarction: Secondary | ICD-10-CM

## 2024-02-24 DIAGNOSIS — Z0289 Encounter for other administrative examinations: Secondary | ICD-10-CM

## 2024-02-24 DIAGNOSIS — N183 Chronic kidney disease, stage 3 unspecified: Secondary | ICD-10-CM | POA: Diagnosis not present

## 2024-02-24 DIAGNOSIS — I129 Hypertensive chronic kidney disease with stage 1 through stage 4 chronic kidney disease, or unspecified chronic kidney disease: Secondary | ICD-10-CM

## 2024-02-24 DIAGNOSIS — E669 Obesity, unspecified: Secondary | ICD-10-CM | POA: Diagnosis not present

## 2024-02-24 DIAGNOSIS — Z6836 Body mass index (BMI) 36.0-36.9, adult: Secondary | ICD-10-CM

## 2024-02-24 DIAGNOSIS — F39 Unspecified mood [affective] disorder: Secondary | ICD-10-CM | POA: Diagnosis not present

## 2024-02-24 DIAGNOSIS — Z87891 Personal history of nicotine dependence: Secondary | ICD-10-CM | POA: Diagnosis not present

## 2024-02-24 DIAGNOSIS — R4589 Other symptoms and signs involving emotional state: Secondary | ICD-10-CM

## 2024-02-24 DIAGNOSIS — I6930 Unspecified sequelae of cerebral infarction: Secondary | ICD-10-CM

## 2024-02-24 DIAGNOSIS — E782 Mixed hyperlipidemia: Secondary | ICD-10-CM

## 2024-02-24 NOTE — Progress Notes (Signed)
 Jonathan DOROTHA Jenkins, DO, ABFM, ABOM Bariatric physician [address] [place], [place], [place] [address] Office: [phone]  /  Fax: [phone]     Initial Evaluation:  [name] was seen in clinic today to evaluate for obesity. He is interested in losing weight to improve overall health and reduce the risk of weight related complications. He presents today to review program treatment options, initial physical assessment, and evaluation.     Patient is here with friend [name]   After assessment and discussion with patient. I do feel like this is not the proper program for the patient at this time. Recommended patient to ask his PCP for referral to a nutritionist. A nutritionist would be better suited to show patient how to learn to eat healthier.  Patient is recommended to use his friend meal plan and if he wants additional help and is ready for change then he should contact us  and get started with the program.   He was referred by: Friend or Family  When asked what they hope to accomplish? He states: he wants to be able to tie his shoes and lose his gut so he can clean his feet.  When asked how has your weight affected you? He states: Contributed to orthopedic problems or mobility issues  Contributing factors to his weight change: consumption of processed foods, reduced physical activity, and nutritional and problems with eating patterns  Some associated conditions: Hypertension, Arthritis:, Hyperlipidemia,Heart Disease and Other: CVA  Current nutrition plan: None  Current level of physical activity: None , tries to do some stretching 30 minutes daily  Current or previous pharmacotherapy: None  Response to medication: Never tried medications   Barriers to weight loss that patient expresses a concern about today: having difficulty focusing on healthy eating, difficulty implementing reduced calorie nutrition plan, and medical comorbidities.     Past Medical History:   Diagnosis Date   Bowel obstruction (HCC)    BPH (benign prostatic hypertrophy)    Gastric outlet obstruction    High cholesterol    Hyperchloremia    Hypertension    Pancreatitis     Current Outpatient Medications  Medication Instructions   acetaminophen  (TYLENOL ) 650 mg, Oral, Every 6 hours PRN   aspirin  EC 81 mg, Daily   atorvastatin  (LIPITOR) 40 mg, Oral, Daily   clopidogrel  (PLAVIX ) 75 mg, Oral, Daily   finasteride  (PROSCAR ) 5 mg, Oral, Daily   lisinopril  (ZESTRIL ) 10 mg, Oral, Daily   Multiple Vitamins-Minerals (MULTIVITAMIN ADULTS 50+ PO) Take by mouth.   Omega-3 Fatty Acids (FISH OIL) 1200 MG CAPS Daily   polyethylene glycol (MIRALAX  / GLYCOLAX ) 17 g, Oral, Daily PRN   tamsulosin  (FLOMAX ) 0.4 mg, Oral, Daily     No Known Allergies   Past Surgical History:  Procedure Laterality Date   ESOPHAGOGASTRODUODENOSCOPY N/A 12/03/2014   Procedure: ESOPHAGOGASTRODUODENOSCOPY (EGD);  Surgeon: Deward CINDERELLA Piedmont, MD;  Location[facility] END[facility]COPY;  Service: Endoscopy;  Laterality: N/A;   ORIF ANKLE FRACTURE Left 06/23/2022   Procedure: OPEN REDUCTION INTERNAL FIXATION (ORIF) ANKLE FRACTURE;  Surgeon: Marchia Drivers, MD;  Location: [facility] ORS;  Service: Orthopedics;  Laterality: Left;     Family History  Problem Relation Age of Onset   Heart disease Mother    Heart disease Father    Heart disease Brother      Objective:  BP (!) 144/76   Pulse 61   Temp 97.8 F (36.6 C)   Ht 5' 8.5 (1.74 m)   Wt 242 lb (109.8 kg)  SpO2 93%   BMI 36.26 kg/m  He was weighed on the bioimpedance scale: Body mass index is 36.26 kg/m.  Visceral Fat rating : 27, Body Fat %:38.7  Weight Lost Since Last Visit: 0  Weight Gained Since Last Visit: 0    Vitals Temp: 97.8 F (36.6 C) BP: (!) 144/76 Pulse Rate: 61 SpO2: 93 %   Anthropometric Measurements Height: 5' 8.5 (1.74 m) Weight: 242 lb (109.8 kg) BMI (Calculated): 36.26 Weight at Last Visit: 0 Weight Lost Since Last Visit: 0 Weight  Gained Since Last Visit: 0 Starting Weight: 0 Total Weight Loss (lbs): 0 lb (0 kg) Peak Weight: 241lb Waist Measurement : 0 inches   Body Composition  Body Fat %: 38.7 % Fat Mass (lbs): 94 lbs Muscle Mass (lbs): 141.4 lbs Total Body Water (lbs): 123.8 lbs Visceral Fat Rating : 27   Other Clinical Data Fasting: no Labs: no Today's Visit #: Consult Starting Date: -- (NA) Comments: Consult     General: Well Developed, well nourished, and in no acute distress.  HEENT: Normocephalic, atraumatic; EOMI, sclerae are anicteric. Skin: Warm and dry, good turgor Chest:  Normal excursion, shape, no gross ABN Respiratory: No conversational dyspnea; speaking in full sentences NeuroM-Sk:  Normal gross ROM * 4 extremities  Psych: A and O *3, insight adequate, mood- full    Assessment and Plan:   FOR THE DISEASE OF OBESITY:  BMI 36.0-36.9,adult Assessment & Plan: We reviewed anthropometrics, biometrics, associated medical conditions and contributing factors with patient. [name] would benefit from a medically tailored reduced calorie nutrional plan based on their REE (resting energy expenditure), which will be determined by indirect calorimetry.  We will also assess for cardiometabolic risk and nutritional derangements via fasting labs at intake appointment.    Obesity Treatment / Action Plan:   he was weighed on the bioimpedance scale and results were discussed and documented in the synopsis.   [name] will complete provided nutritional and psychosocial assessment questionnaire before the next appointment.  he will be scheduled for indirect calorimetry to determine resting energy expenditure in a fasting state.  This will allow us  to create a reduced calorie, high-protein meal plan to promote loss of fat mass while preserving muscle mass.  We will also assess for cardiometabolic risk and nutritional derangements via an ECG and fasting serologies at his next appointment.  he was  encouraged to work on amassing support from family and friends to begin their weight loss journey.   Work on eliminating or reducing the presence of highly processed, poorly nutritious, calorie-dense foods in the home.   Obesity Education Performed Today:  Patient was counseled on nutritional approaches to weight loss and benefits of reducing processed foods and consuming plant-based foods and high quality protein as part of nutritional weight management program.   We discussed the importance of long term lifestyle changes which include nutrition, exercise and behavioral modifications as well as the importance of customizing this to his specific health and social needs.   We discussed the benefits of reaching a healthier weight to alleviate the symptoms of existing conditions and reduce the risks of the biomechanical, metabolic and psychological effects of obesity.  Was counseled on the health benefits of losing 5%-10% of total body weight.  Was counseled on our cognitive behavorial therapy program, lead by our bariatric psychologist, who focuses on emotional eating and creating positive behavorial change.  Was counseled on bariatric pharmacotherapy and how this may be used as an adjunct in their  weight management    [name] appears to be in the action stage of change and states they are ready to start intensive lifestyle modifications and behavioral modifications.  It was recommended that he follow up in the next 1-2 weeks to review the above steps, and to continue with treatment of their chronic disease state of obesity   FOR OTHER CONDITIONS RELATED TO THE DISEASE OF OBESITY:  Benign hypertension with CKD (chronic kidney disease) stage III [facility]) Assessment & Plan BP Readings from Last 3 Encounters:  02/24/24 (!) 144/76  01/28/24 129/84  01/11/24 134/80   The ASCVD Risk score (Arnett DK, et al., 2019) failed to calculate for the following reasons:   The 2019 ASCVD risk score is  only valid for ages 106 to 42   Risk score cannot be calculated because patient has a medical history suggesting prior/existing ASCVD  Lab Results  Component Value Date   CREATININE 1.12 07/09/2023   Lisinopril  10 mg daily. Good compliance and tolerance. Patient would benefit from a low sodium diet.     Mixed hyperlipidemia Assessment & Plan Lab Results  Component Value Date   CHOL 136 07/09/2023   HDL 42 07/09/2023   LDLCALC 68 07/09/2023   TRIG 192 (H) 07/09/2023   CHOLHDL 3.2 07/09/2023    Lipitor 40 mg daily. With reported good compliance. Patient would benefit from a nutritional meal plan with decreased foods high is saturated and trans fat.      History of cerebrovascular accident (CVA) with residual deficit Assessment & Plan Taking Plavix  75 mg daily and Aspirin  81 mg daily. Reports  history of stroke with memory loss. Patient would benefit in seeing a nutritionist to get more counseling about foods.     Depressed mood Assessment & Plan Patient states that his wife was admitted into an assisted living home and has been there for the last 20 months. Patient endorses that this has affected his mood severely. I have some concerns about the patient mood and his cognitive abilities in order to be able to succeed in this program and due to the frequency of visits. Recommend that [name] reach out to PCP with patient to discuss possible medications ans well as getting him involved in the [facility] or senior centers.      Attestations:   [name], acting as a medical scribe for Jonathan Jenkins, DO., have compiled all relevant documentation for today's office visit on behalf of Jonathan Jenkins, DO, while in the presence of Jonathan & Mclennan, DO.  I have spent 58 minutes in the care of the patient today.  48 minutes was spent in face to face counseling of the patient on the disease of obesity and what our program can do for their medical conditions as well as in preventing future  diseases. I discussed the importance of comprehensive care in the treatment of obesity including mental well being and physical activity. 10 minutes was spent on pre-chart review and additional post visit documentation.   I have reviewed the above documentation for accuracy and completeness, and I agree with the above. [name], D.O.  The 21st Century Cures Act was signed into law in 2016 which includes the topic of electronic health records.  This provides immediate access to information in MyChart.  This includes consultation notes, operative notes, office notes, lab results and pathology reports.  If you have any questions about what you read please let us  know at your next visit so we can discuss your concerns  and take corrective action if need be.  We are right here with you!

## 2024-02-25 ENCOUNTER — Ambulatory Visit: Payer: Self-pay

## 2024-03-03 ENCOUNTER — Other Ambulatory Visit: Payer: Self-pay

## 2024-03-03 ENCOUNTER — Other Ambulatory Visit: Payer: Self-pay | Admitting: Family Medicine

## 2024-03-03 DIAGNOSIS — E782 Mixed hyperlipidemia: Secondary | ICD-10-CM

## 2024-03-03 NOTE — Patient Outreach (Signed)
 Complex Care Management   Visit Note  03/03/2024  Name:  [name] MRN: [id number] DOB: [birth year]  Situation: Referral received for Complex Care Management related to Hypertension and Hyperlipidemia. I obtained verbal consent from Patient.  Visit completed with [name] via telephone.  Background:   Past Medical History:  Diagnosis Date   Bowel obstruction (HCC)    BPH (benign prostatic hypertrophy)    Gastric outlet obstruction    High cholesterol    Hyperchloremia    Hypertension    Pancreatitis      Assessment: Patient Reported Symptoms: Cognitive Cognitive Status: Alert and oriented to person, place, and time, Normal speech and language skills Cognitive/Intellectual Conditions Management [RPT]: Other Other: Reports mild cognitive and memory changes since stroke Health Maintenance Behaviors: Annual physical exam, Social activities, Spiritual practice(s), Hobbies Healing Pattern: Average Health Facilitated by: Healthy diet, Prayer/meditation  Neurological Neurological Review of Symptoms: No symptoms reported Neurological Management Strategies: Adequate rest, Coping strategies, Medication therapy, Routine screening Neurological Self-Management Outcome: 4 (good)  HEENT HEENT Symptoms Reported: No symptoms reported HEENT Self-Management Outcome: 4 (good)  Cardiovascular Cardiovascular Symptoms Reported: No symptoms reported Does patient have uncontrolled Hypertension?: No Cardiovascular Self-Management Outcome: 4 (good)  Respiratory Respiratory Symptoms Reported: No symptoms reported Respiratory Management Strategies: Routine screening Respiratory Self-Management Outcome: 4 (good)  Endocrine Endocrine Symptoms Reported: No symptoms reported Is patient diabetic?: No  Gastrointestinal Gastrointestinal Symptoms Reported: No symptoms reported Gastrointestinal Management Strategies: Coping strategies, Diet modification, Medication therapy (Routine Outreach) Gastrointestinal  Self-Management Outcome: 4 (good)  Genitourinary Genitourinary Symptoms Reported: No symptoms reported Additional Genitourinary Details: Denies current symptoms. Will continue annual outreach with the Urology team Genitourinary Management Strategies: Coping strategies, Medication therapy Genitourinary Self-Management Outcome: 4 (good)  Integumentary Integumentary Symptoms Reported: No symptoms reported Skin Management Strategies: Routine screening Skin Self-Management Outcome: 4 (good)  Musculoskeletal Musculoskelatal Symptoms Reviewed: No symptoms reported Musculoskeletal Management Strategies: Routine screening, Coping strategies Musculoskeletal Self-Management Outcome: 4 (good)  Psychosocial Psychosocial Symptoms Reported: No symptoms reported Do you feel physically threatened by others?: No    03/06/2024    PHQ2-9 Depression Screening   Little interest or pleasure in doing things Not at all  Feeling down, depressed, or hopeless Not at all  PHQ-2 - Total Score 0    There were no vitals filed for this visit. Pain Scale: 0-10 Pain Score: 0-No pain  Medications Reviewed Today     Reviewed by Karoline Lima, RN (Registered Nurse) on 03/03/24 at 1132  Med List Status: <None>   Medication Order Taking? Sig Documenting Provider Last Dose Status Informant  acetaminophen  (TYLENOL ) 325 MG tablet [id number]  Take 2 tablets (650 mg total) by mouth every 6 (six) hours as needed for mild pain, fever or headache (or Fever >/= 101). Fausto Burnard LABOR, DO  Active   aspirin  EC 81 MG tablet [id number]  Take 81 mg by mouth daily. Swallow whole. [provider]  Active   atorvastatin  (LIPITOR) 40 MG tablet [id number]  Take 1 tablet (40 mg total) by mouth daily. Edman Marsa PARAS, DO  Active   clopidogrel  (PLAVIX ) 75 MG tablet [id number]  Take 1 tablet (75 mg total) by mouth daily. Edman Marsa PARAS, DO  Active   finasteride  (PROSCAR ) 5 MG tablet [id number]  Take 1 tablet (5 mg  total) by mouth daily. Edman Marsa PARAS, DO  Active   lisinopril  (ZESTRIL ) 10 MG tablet [id number]  Take 1 tablet (10 mg total) by mouth daily. Edman Marsa PARAS, DO  Active  Multiple Vitamins-Minerals (MULTIVITAMIN ADULTS 50+ PO) [id number]  Take by mouth. [provider]  Active Self  Omega-3 Fatty Acids (FISH OIL) 1200 MG CAPS [id number]  Take by mouth daily at 6 (six) AM. [provider]  Active   polyethylene glycol (MIRALAX  / GLYCOLAX ) 17 g packet [id number]  Take 17 g by mouth daily as needed for mild constipation. Edman Marsa PARAS, DO  Active   tamsulosin  (FLOMAX ) 0.4 MG CAPS capsule [id number]  Take 1 capsule (0.4 mg total) by mouth daily. Francisca Redell BROCKS, MD  Active             Recommendation:   Continue Current Plan of Care  Follow Up Plan:   Patient has met all care management goals. Declines current need for additional outreach. Agreed to contact the clinic if his care needs change and additional outreach is needed. Patient has been provided contact information should new needs arise.     Jackson Acron [facility] Population Health RN Care Manager Direct Dial: [phone]  Fax: [phone] Website: [email or web address]

## 2024-03-06 NOTE — Patient Instructions (Signed)
 Thank you for allowing the Complex Care Management team to participate in your care. It was great speaking with you!  Please do not hesitate to contact your PCP if your health or care needs change and you require additional outreach. The care team will gladly assist.   Jackson Acron [facility] RN Care Manager Direct Dial: [phone]  Fax: [phone] Website: [email or web address]

## 2024-03-06 NOTE — Telephone Encounter (Signed)
 Requested Prescriptions  Pending Prescriptions Disp Refills   atorvastatin  (LIPITOR) 40 MG tablet [Pharmacy Med Name: Atorvastatin  Calcium  40 MG Oral Tablet] 90 tablet 0    Sig: Take 1 tablet by mouth once daily     Cardiovascular:  Antilipid - Statins Failed - 03/06/2024  1:31 PM      Failed - Lipid Panel in normal range within the last 12 months    Cholesterol  Date Value Ref Range Status  07/09/2023 136 <200 mg/dL Final   LDL Cholesterol (Calc)  Date Value Ref Range Status  07/09/2023 68 mg/dL (calc) Final    Comment:    Reference range: <100 . Desirable range <100 mg/dL for primary prevention;   <70 mg/dL for patients with CHD or diabetic patients  with > or = 2 CHD risk factors. SABRA LDL-C is now calculated using the Martin-Hopkins  calculation, which is a validated novel method providing  better accuracy than the Friedewald equation in the  estimation of LDL-C.  Gladis APPLETHWAITE et al. SANDREA. 7986;689(80): 2061-2068  (http://education.[employer].com/faq/FAQ164)    HDL  Date Value Ref Range Status  07/09/2023 42 > OR = 40 mg/dL Final   Triglycerides  Date Value Ref Range Status  07/09/2023 192 (H) <150 mg/dL Final         Passed - Patient is not pregnant      Passed - Valid encounter within last 12 months    Recent Outpatient Visits           1 month ago Neuropathy involving both lower extremities   [place] [name] [place], Marsa PARAS, DO   1 month ago No-show for appointment   [place] [facility] Edman Marsa PARAS, DO   8 months ago Benign hypertension with CKD (chronic kidney disease) stage III [facility])   [place] [facility] Ed[place] Marsa PARAS, DO       Future Appointments             In 11 months McGowan, Clotilda DELENA RIGGERS [facility] Urology [facility]

## 2024-03-09 ENCOUNTER — Other Ambulatory Visit: Payer: Self-pay | Admitting: Family Medicine

## 2024-03-09 DIAGNOSIS — N183 Chronic kidney disease, stage 3 unspecified: Secondary | ICD-10-CM

## 2024-03-10 NOTE — Telephone Encounter (Signed)
 Requested medications are due for refill today.  yes  Requested medications are on the active medications list.  yes  Last refill. 07/09/2023 #90 1 rf  Future visit scheduled.   no  Notes to clinic.  Expired labs    Requested Prescriptions  Pending Prescriptions Disp Refills   lisinopril  (ZESTRIL ) 10 MG tablet [Pharmacy Med Name: Lisinopril  10 MG Oral Tablet] 90 tablet 0    Sig: Take 1 tablet by mouth once daily     Cardiovascular:  ACE Inhibitors Failed - 03/10/2024  5:55 PM      Failed - Cr in normal range and within 180 days    Creat  Date Value Ref Range Status  07/09/2023 1.12 0.70 - 1.22 mg/dL Final         Failed - K in normal range and within 180 days    Potassium  Date Value Ref Range Status  07/09/2023 4.5 3.5 - 5.3 mmol/L Final  07/18/2013 4.0 3.5 - 5.1 mmol/L Final         Failed - Last BP in normal range    BP Readings from Last 1 Encounters:  02/24/24 (!) 144/76         Passed - Patient is not pregnant      Passed - Valid encounter within last 6 months    Recent Outpatient Visits           1 month ago Neuropathy involving both lower extremities   [place] [facility] Ed[place] Marsa PARAS, DO   1 month ago No-show for appointment   [place] [facility] Ed[place] Marsa PARAS, DO   8 months ago Benign hypertension with CKD (chronic kidney disease) stage III [facility])    [facility] [place], Marsa PARAS, DO       Future Appointments             In 10 months McGowan, Clotilda DELENA RIGGERS [facility] [address] Urology [facility]

## 2024-04-21 ENCOUNTER — Other Ambulatory Visit: Payer: Self-pay | Admitting: Family Medicine

## 2024-04-21 DIAGNOSIS — N138 Other obstructive and reflux uropathy: Secondary | ICD-10-CM

## 2024-04-21 NOTE — Telephone Encounter (Signed)
 Requested Prescriptions  Pending Prescriptions Disp Refills   finasteride  (PROSCAR ) 5 MG tablet [Pharmacy Med Name: Finasteride  5 MG Oral Tablet] 90 tablet 0    Sig: Take 1 tablet by mouth once daily     Urology: 5-alpha Reductase Inhibitors Failed - 04/21/2024  4:24 PM      Failed - PSA in normal range and within 360 days    PSA  Date Value Ref Range Status  07/09/2023 8.80 (H) < OR = 4.00 ng/mL Final    Comment:    The total PSA value from this assay system is  standardized against the WHO standard. The test  result will be approximately 20% lower when compared  to the equimolar-standardized total PSA (Beckman  Coulter). Comparison of serial PSA results should be  interpreted with this fact in mind. . This test was performed using the Siemens  chemiluminescent method. Values obtained from  different assay methods cannot be used interchangeably. PSA levels, regardless of value, should not be interpreted as absolute evidence of the presence or absence of disease.    Prostate Specific Ag, Serum  Date Value Ref Range Status  11/08/2018 17.4 (H) 0.0 - 4.0 ng/mL Final    Comment:    Roche ECLIA methodology. According to the American Urological Association, Serum PSA should decrease and remain at undetectable levels after radical prostatectomy. The AUA defines biochemical recurrence as an initial PSA value 0.2 ng/mL or greater followed by a subsequent confirmatory PSA value 0.2 ng/mL or greater. Values obtained with different assay methods or kits cannot be used interchangeably. Results cannot be interpreted as absolute evidence of the presence or absence of malignant disease.          Passed - Valid encounter within last 12 months    Recent Outpatient Visits           3 months ago Neuropathy involving both lower extremities   [place] [facility] [place], Marsa PARAS, DO   3 months ago No-show for appointment   [place] [facility]  Edman Marsa PARAS, DO   9 months ago Benign hypertension with CKD (chronic kidney disease) stage III [facility])   [place] [facility] Ed[place] Marsa PARAS, DO       Future Appointments             In 9 months McGowan, Clotilda DELENA RIGGERS [facility] Urology [facility]

## 2024-04-24 ENCOUNTER — Ambulatory Visit (INDEPENDENT_AMBULATORY_CARE_PROVIDER_SITE_OTHER): Admitting: Podiatry

## 2024-04-24 DIAGNOSIS — Z91199 Patient's noncompliance with other medical treatment and regimen due to unspecified reason: Secondary | ICD-10-CM

## 2024-04-24 NOTE — Progress Notes (Signed)
 1. No-show for appointment

## 2024-05-02 ENCOUNTER — Other Ambulatory Visit: Payer: Self-pay | Admitting: Family Medicine

## 2024-05-02 DIAGNOSIS — E782 Mixed hyperlipidemia: Secondary | ICD-10-CM

## 2024-05-03 NOTE — Telephone Encounter (Signed)
 Requested by interface surescripts. Last OV 01/11/24 last labs 07/09/23. No future visit at this time.  Requested Prescriptions  Pending Prescriptions Disp Refills   atorvastatin  (LIPITOR) 40 MG tablet [Pharmacy Med Name: Atorvastatin  Calcium  40 MG Oral Tablet] 90 tablet 0    Sig: Take 1 tablet by mouth once daily     Cardiovascular:  Antilipid - Statins Failed - 05/03/2024  1:00 PM      Failed - Lipid Panel in normal range within the last 12 months    Cholesterol  Date Value Ref Range Status  07/09/2023 136 <200 mg/dL Final   LDL Cholesterol (Calc)  Date Value Ref Range Status  07/09/2023 68 mg/dL (calc) Final    Comment:    Reference range: <100 . Desirable range <100 mg/dL for primary prevention;   <70 mg/dL for patients with CHD or diabetic patients  with > or = 2 CHD risk factors. [facility] LDL-C is now calculated using the Martin-Hopkins  calculation, which is a validated novel method providing  better accuracy than the Friedewald equation in the  estimation of LDL-C.  Gladis APPLETHWAITE et al. SANDREA. 7986;689(80): 2061-2068  (http://education.[facility].com/faq/FAQ164)    HDL  Date Value Ref Range Status  07/09/2023 42 > OR = 40 mg/dL Final   Triglycerides  Date Value Ref Range Status  07/09/2023 192 (H) <150 mg/dL Final         Passed - Patient is not pregnant      Passed - Valid encounter within last 12 months    Recent Outpatient Visits           3 months ago Neuropathy involving both lower extremities   [place] [facility] [place], Marsa PARAS, DO   3 months ago No-show for appointment   [place] [facility] Marsa PARAS, DO   9 months ago Benign hypertension with CKD (chronic kidney disease) stage III [facility])   [facility] [place], Marsa PARAS, DO       Future Appointments             In 9 months [place], Clotilda DELENA RIGGERS [facility]

## 2024-05-19 ENCOUNTER — Telehealth: Payer: Self-pay | Admitting: Podiatry

## 2024-05-19 NOTE — Telephone Encounter (Signed)
 [facility] in [facility] called unable to leave a message mailbox is full

## 2024-05-26 ENCOUNTER — Other Ambulatory Visit: Payer: Self-pay

## 2024-05-26 ENCOUNTER — Ambulatory Visit: Payer: Self-pay | Admitting: Family Medicine

## 2024-05-26 DIAGNOSIS — N401 Enlarged prostate with lower urinary tract symptoms: Secondary | ICD-10-CM

## 2024-05-26 MED ORDER — TAMSULOSIN HCL 0.4 MG PO CAPS: 0.4000 mg | ORAL_CAPSULE | Freq: Every day | ORAL | 3 refills | Status: AC

## 2024-05-26 NOTE — Telephone Encounter (Signed)
 Refill sent to [facility]

## 2024-05-26 NOTE — Telephone Encounter (Signed)
 FYI Only or Action Required?: Action required by provider: medication request.  Patient was last seen in primary care on 02/24/2024 by Midge Sober, DO.  Called Nurse Triage reporting Medication Refill and Release of Information.  Symptoms are: unchanged.  Triage Disposition: Call PCP When Office is Open  Patient/caregiver understands and will follow disposition?: Yes    Copied from CRM [phone]. Topic: Clinical - Prescription Issue >> May 26, 2024 10:57 AM [name] wrote: Reason for CRM: patient is not sure which medication he needs, which Patient stated the pharmacy need a providers signature. Please advise patient has not had this medication for 2-3 weeks. [phone]   ----------------------------------------------------------------------- From previous Reason for Contact - Medication Question: Reason for CRM: Reason for Disposition  [1] Prescription refill request for NON-ESSENTIAL medicine (i.e., no harm to patient if med not taken) AND [2] triager unable to refill per department policy  Answer Assessment - Initial Assessment Questions Returned call to patient to discuss medication refill question. Patient unsure of what medication he is in need of but knows he hasn't had it in 3 weeks. Patient denies dizziness, weakness,or pain presently. Patient mentioned having difficulty stop car when driving and concerned the lack of medication was the cause. Patient given medication education.  1. DRUG NAME: What medicine do you need to have refilled?     Patient unsure but after reviewing medications in the home, the one that is missing is Tamsulosin   2. REFILLS REMAINING: How many refills are remaining? Notes: The label on the medicine or pill bottle will show how many refills are remaining. If there are no refills remaining, then a renewal may be needed.     No refills remailing  4. PRESCRIBER: Who prescribed it? Note: The prescribing doctor or group is responsible for  refill approvals.[facility]     PCP  6. SYMPTOMS: Do you have any symptoms?     Patient does not urinate normally -  dribble like a little kid  Protocols used: Medication Refill and Renewal Call-A-AH

## 2025-01-30 ENCOUNTER — Ambulatory Visit: Admitting: Urology
